# Patient Record
Sex: Male | Born: 1950 | Hispanic: No | Marital: Married | Smoking: Never smoker
Health system: Southern US, Community
[De-identification: ages and names within clinical notes are randomized; demographics above are authoritative.]

## PROBLEM LIST (undated history)

## (undated) DIAGNOSIS — H8110 Benign paroxysmal vertigo, unspecified ear: Secondary | ICD-10-CM

## (undated) DIAGNOSIS — E785 Hyperlipidemia, unspecified: Secondary | ICD-10-CM

## (undated) DIAGNOSIS — M109 Gout, unspecified: Secondary | ICD-10-CM

## (undated) DIAGNOSIS — L989 Disorder of the skin and subcutaneous tissue, unspecified: Secondary | ICD-10-CM

## (undated) DIAGNOSIS — Z8673 Personal history of transient ischemic attack (TIA), and cerebral infarction without residual deficits: Secondary | ICD-10-CM

## (undated) DIAGNOSIS — I639 Cerebral infarction, unspecified: Secondary | ICD-10-CM

## (undated) DIAGNOSIS — M7700 Medial epicondylitis, unspecified elbow: Secondary | ICD-10-CM

## (undated) DIAGNOSIS — R29898 Other symptoms and signs involving the musculoskeletal system: Secondary | ICD-10-CM

## (undated) DIAGNOSIS — Z951 Presence of aortocoronary bypass graft: Secondary | ICD-10-CM

## (undated) DIAGNOSIS — Z87828 Personal history of other (healed) physical injury and trauma: Secondary | ICD-10-CM

## (undated) DIAGNOSIS — N401 Enlarged prostate with lower urinary tract symptoms: Secondary | ICD-10-CM

## (undated) DIAGNOSIS — I10 Essential (primary) hypertension: Secondary | ICD-10-CM

## (undated) DIAGNOSIS — H9190 Unspecified hearing loss, unspecified ear: Secondary | ICD-10-CM

## (undated) DIAGNOSIS — Z973 Presence of spectacles and contact lenses: Secondary | ICD-10-CM

## (undated) DIAGNOSIS — F329 Major depressive disorder, single episode, unspecified: Secondary | ICD-10-CM

## (undated) DIAGNOSIS — I2510 Atherosclerotic heart disease of native coronary artery without angina pectoris: Secondary | ICD-10-CM

## (undated) DIAGNOSIS — I493 Ventricular premature depolarization: Secondary | ICD-10-CM

## (undated) DIAGNOSIS — M25569 Pain in unspecified knee: Secondary | ICD-10-CM

## (undated) DIAGNOSIS — M25512 Pain in left shoulder: Secondary | ICD-10-CM

## (undated) DIAGNOSIS — I6529 Occlusion and stenosis of unspecified carotid artery: Secondary | ICD-10-CM

## (undated) DIAGNOSIS — K219 Gastro-esophageal reflux disease without esophagitis: Secondary | ICD-10-CM

## (undated) DIAGNOSIS — C61 Malignant neoplasm of prostate: Secondary | ICD-10-CM

## (undated) DIAGNOSIS — K439 Ventral hernia without obstruction or gangrene: Secondary | ICD-10-CM

## (undated) DIAGNOSIS — I350 Nonrheumatic aortic (valve) stenosis: Secondary | ICD-10-CM

## (undated) DIAGNOSIS — R21 Rash and other nonspecific skin eruption: Secondary | ICD-10-CM

## (undated) DIAGNOSIS — R739 Hyperglycemia, unspecified: Secondary | ICD-10-CM

## (undated) DIAGNOSIS — R4701 Aphasia: Secondary | ICD-10-CM

## (undated) DIAGNOSIS — E119 Type 2 diabetes mellitus without complications: Secondary | ICD-10-CM

## (undated) DIAGNOSIS — G459 Transient cerebral ischemic attack, unspecified: Secondary | ICD-10-CM

## (undated) DIAGNOSIS — H269 Unspecified cataract: Secondary | ICD-10-CM

## (undated) DIAGNOSIS — F411 Generalized anxiety disorder: Secondary | ICD-10-CM

## (undated) DIAGNOSIS — E876 Hypokalemia: Secondary | ICD-10-CM

## (undated) HISTORY — DX: Gastro-esophageal reflux disease without esophagitis: K21.9

## (undated) HISTORY — PX: PROSTATE BIOPSY: SHX241

## (undated) HISTORY — DX: Nonrheumatic aortic (valve) stenosis: I35.0

## (undated) HISTORY — PX: EYE SURGERY: SHX253

## (undated) HISTORY — DX: Cerebral infarction, unspecified: I63.9

## (undated) HISTORY — DX: Atherosclerotic heart disease of native coronary artery without angina pectoris: I25.10

## (undated) HISTORY — PX: OTHER SURGICAL HISTORY: SHX169

## (undated) HISTORY — PX: CARDIAC CATHETERIZATION: SHX172

## (undated) HISTORY — DX: Essential (primary) hypertension: I10

## (undated) HISTORY — DX: Hyperlipidemia, unspecified: E78.5

## (undated) HISTORY — DX: Personal history of other (healed) physical injury and trauma: Z87.828

## (undated) HISTORY — PX: FRACTURE SURGERY: SHX138

## (undated) HISTORY — DX: Unspecified cataract: H26.9

---
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
## 1973-08-03 HISTORY — PX: APPENDECTOMY: SHX54

## 1998-12-02 HISTORY — PX: CORONARY ARTERY BYPASS GRAFT: SHX141

## 1998-12-30 ENCOUNTER — Encounter: Payer: Self-pay | Admitting: Interventional Cardiology

## 1998-12-30 ENCOUNTER — Inpatient Hospital Stay (HOSPITAL_COMMUNITY): Admission: AD | Admit: 1998-12-30 | Discharge: 1999-01-05 | Payer: Self-pay | Admitting: Interventional Cardiology

## 1998-12-31 ENCOUNTER — Encounter: Payer: Self-pay | Admitting: Interventional Cardiology

## 1999-01-01 ENCOUNTER — Encounter: Payer: Self-pay | Admitting: Interventional Cardiology

## 1999-01-02 ENCOUNTER — Encounter: Payer: Self-pay | Admitting: Cardiothoracic Surgery

## 1999-01-03 ENCOUNTER — Encounter: Payer: Self-pay | Admitting: Cardiothoracic Surgery

## 1999-03-04 ENCOUNTER — Encounter (HOSPITAL_COMMUNITY): Admission: RE | Admit: 1999-03-04 | Discharge: 1999-06-02 | Payer: Self-pay | Admitting: Interventional Cardiology

## 2001-12-22 ENCOUNTER — Ambulatory Visit (HOSPITAL_COMMUNITY): Admission: RE | Admit: 2001-12-22 | Discharge: 2001-12-22 | Payer: Self-pay | Admitting: Interventional Cardiology

## 2005-02-26 ENCOUNTER — Encounter: Payer: Self-pay | Admitting: Family Medicine

## 2006-03-01 ENCOUNTER — Encounter: Payer: Self-pay | Admitting: Family Medicine

## 2006-10-13 ENCOUNTER — Encounter: Payer: Self-pay | Admitting: Family Medicine

## 2007-08-04 LAB — HM COLONOSCOPY

## 2008-01-09 ENCOUNTER — Encounter: Payer: Self-pay | Admitting: Family Medicine

## 2008-07-19 ENCOUNTER — Encounter: Payer: Self-pay | Admitting: Family Medicine

## 2009-07-31 ENCOUNTER — Encounter: Payer: Self-pay | Admitting: Family Medicine

## 2009-11-11 ENCOUNTER — Encounter: Payer: Self-pay | Admitting: Family Medicine

## 2010-04-02 ENCOUNTER — Encounter: Payer: Self-pay | Admitting: Family Medicine

## 2010-07-18 ENCOUNTER — Ambulatory Visit: Payer: Self-pay | Admitting: Family Medicine

## 2010-07-18 DIAGNOSIS — M109 Gout, unspecified: Secondary | ICD-10-CM | POA: Insufficient documentation

## 2010-07-18 DIAGNOSIS — I10 Essential (primary) hypertension: Secondary | ICD-10-CM

## 2010-07-18 DIAGNOSIS — E785 Hyperlipidemia, unspecified: Secondary | ICD-10-CM | POA: Insufficient documentation

## 2010-07-18 HISTORY — DX: Gout, unspecified: M10.9

## 2010-07-18 HISTORY — DX: Essential (primary) hypertension: I10

## 2010-07-18 HISTORY — DX: Hyperlipidemia, unspecified: E78.5

## 2010-07-19 ENCOUNTER — Telehealth: Payer: Self-pay | Admitting: Family Medicine

## 2010-07-19 DIAGNOSIS — I10 Essential (primary) hypertension: Secondary | ICD-10-CM | POA: Insufficient documentation

## 2010-07-19 DIAGNOSIS — N400 Enlarged prostate without lower urinary tract symptoms: Secondary | ICD-10-CM

## 2010-07-19 DIAGNOSIS — K219 Gastro-esophageal reflux disease without esophagitis: Secondary | ICD-10-CM | POA: Insufficient documentation

## 2010-07-19 DIAGNOSIS — I2510 Atherosclerotic heart disease of native coronary artery without angina pectoris: Secondary | ICD-10-CM | POA: Insufficient documentation

## 2010-07-19 HISTORY — DX: Atherosclerotic heart disease of native coronary artery without angina pectoris: I25.10

## 2010-07-19 HISTORY — DX: Gastro-esophageal reflux disease without esophagitis: K21.9

## 2010-07-21 LAB — CONVERTED CEMR LAB
ALT: 21 units/L (ref 0–53)
AST: 19 units/L (ref 0–37)
Albumin: 4.1 g/dL (ref 3.5–5.2)
Alkaline Phosphatase: 83 units/L (ref 39–117)
BUN: 18 mg/dL (ref 6–23)
Bilirubin, Direct: 0.2 mg/dL (ref 0.0–0.3)
CO2: 32 meq/L (ref 19–32)
Calcium: 9.5 mg/dL (ref 8.4–10.5)
Chloride: 103 meq/L (ref 96–112)
Cholesterol: 134 mg/dL (ref 0–200)
Creatinine, Ser: 1.2 mg/dL (ref 0.4–1.5)
GFR calc non Af Amer: 67.26 mL/min (ref >60.00)
Glucose, Bld: 89 mg/dL (ref 70–99)
HDL: 40.1 mg/dL (ref >39.00)
LDL Cholesterol: 69 mg/dL (ref 0–99)
PSA: 2.95 ng/mL (ref 0.10–4.00)
Potassium: 4 meq/L (ref 3.5–5.1)
Sodium: 142 meq/L (ref 135–145)
Total Bilirubin: 1.1 mg/dL (ref 0.3–1.2)
Total CHOL/HDL Ratio: 3
Total Protein: 7.1 g/dL (ref 6.0–8.3)
Triglycerides: 127 mg/dL (ref 0.0–149.0)
Uric Acid, Serum: 5.9 mg/dL (ref 4.0–7.8)
VLDL: 25.4 mg/dL (ref 0.0–40.0)

## 2010-08-06 ENCOUNTER — Encounter: Payer: Self-pay | Admitting: Family Medicine

## 2010-08-06 LAB — CONVERTED CEMR LAB
Cholesterol: 145 mg/dL
Creatinine, Ser: 1.2 mg/dL
Direct LDL: 75 mg/dL
Glucose: 91 mg/dL
HDL: 50 mg/dL
Triglycerides: 140 mg/dL
Uric Acid, Serum: 5.4 mg/dL

## 2010-09-04 NOTE — Progress Notes (Signed)
  Prescriptions: OMEPRAZOLE MAGNESIUM 20.6 (20 BASE) MG CPDR (OMEPRAZOLE MAGNESIUM) take 1 by mouth once daily  #90 x 3   Entered and Authorized by:   Crawford Givens MD   Signed by:   Crawford Givens MD on 07/19/2010   Method used:   Electronically to        [facility] [facility] (retail)       [address]       [place], [place]  [address]       Ph: [phone]       Fax: [phone]   RxID:   [id number] SIMVASTATIN 80 MG TABS (SIMVASTATIN) take 1 by mouth once daily  #90 x 3   Entered and Authorized by:   Crawford Givens MD   Signed by:   Crawford Givens MD on 07/19/2010   Method used:   Electronically to        [facility] (retail)       [address]       [place], [place]  [address]       Ph: [phone]       Fax: [phone]   RxID:   [id number] ALLOPURINOL 300 MG TABS (ALLOPURINOL) take 1 by mouth once daily  #90 x 3   Entered and Authorized by:   Crawford Givens MD   Signed by:   Crawford Givens MD on 07/19/2010   Method used:   Electronically to        [facility] ([facility]ail)       [address]       [place], [place]  [address]       Ph: [phone]       Fax: [phone]   RxID:   [id number] HYDROCHLOROTHIAZIDE 25 MG TABS (HYDROCHLOROTHIAZIDE) take 1 by mouth once daily  #90 x 3   Entered and Authorized by:   Crawford Givens MD   Signed by:   Crawford Givens MD on 07/19/2010   Method used:   Electronically to        [facility] [facility] (retail)       [address]       [place], [place]  [address]       Ph: [phone]       Fax: [phone]   RxID:   [id number]

## 2010-09-04 NOTE — Assessment & Plan Note (Signed)
Summary: TRANSFER FROM [facility]   Vital Signs:  Patient profile:   60 year old male Height:      71 inches Weight:      221 pounds BMI:     30.93 Temp:     98.8 degrees F oral Pulse rate:   68 / minute Pulse rhythm:   regular BP sitting:   120 / 80  (left arm) Cuff size:   large  Vitals Entered By: Mervin Hack CMA Duncan Dull) (July 18, 2010 12:01 PM) CC: new patient to establish care, Preventive Care   History of Present Illness: CPE- See prev.  CAD per Dr. Katrinka Blazing with cards.  No CP  Hypertension:      Using medication without problems or lightheadedness: yes Chest pain with exertion:no Edema:no Short of breath:no Other issues:no  Elevated Cholesterol: Using medications without problems:yes Muscle aches: no Other complaints:no  Gout- no flares.  Doing well on meds.    GERD well controlled and working on diet, eating smaller meals.    Allergies (verified): No Known Drug Allergies  Past History:  Past Medical History: Coronary artery disease- [facility] with [facility] Gout Hyperlipidemia Hypertension GERD Benign prostatic hypertrophy chronic changes to L eye after injury  Past Surgical History: Coronary artery bypass graft 12-26-1998 Appendectomy 1975  Family History: Reviewed history and no changes required. F dead 12-25-08, had prostate cancer in 45s M alive  Social History: Reviewed history and no changes required. Working at [employer], travelling 3-4 nights a week Married 1972-12-25 2 daughters and 2 grandkids exercise- 1x/week  Review of Systems       See HPI.  Otherwise negative.    Physical Exam  General:  GEN: nad, alert and oriented HEENT: mucous membranes moist NECK: supple w/o LA CV: rrr.  no murmur PULM: ctab, no inc wob ABD: soft, +bs EXT: no edema SKIN: no acute rash  midline sternotomy scar noted.  Rectal:  No external abnormalities noted. Normal sphincter tone. No rectal masses or tenderness. Prostate:  Prostate gland firm and  smooth, symmetric enlargement, but no nodularity, tenderness, mass, asymmetry or induration.   Impression & Recommendations:  Problem # 1:  Preventive Health Care (ICD-V70.0) See notes on labs.  d/w patient ZO:XWRU and exercise. Up to date on vaccine and colonscopy.  Problem # 2:  GOUT (ICD-274.9) see notes on labs.   Orders: TLB-Uric Acid, Blood (84550-URIC)  His updated medication list for this problem includes:    Allopurinol 300 Mg Tabs (Allopurinol) .[name]... Take 1 by mouth once daily  Problem # 3:  HYPERLIPIDEMIA (ICD-272.4) see notes on labs.  no chagne in meds.  Orders: TLB-BMP (Basic Metabolic Panel-BMET) (80048-METABOL) TLB-Hepatic/Liver Function Pnl (80076-HEPATIC) TLB-Lipid Panel (80061-LIPID)  His updated medication list for this problem includes:    Simvastatin 80 Mg Tabs (Simvastatin) .[name]... Take 1 by mouth once daily  Problem # 4:  HYPERTENSION, BENIGN ESSENTIAL (ICD-401.1) see notes on labs.  no change in meds.  His updated medication list for this problem includes:    Hydrochlorothiazide 25 Mg Tabs (Hydrochlorothiazide) .[name]... Take 1 by mouth once daily  Problem # 5:  GERD (ICD-530.81) controlled, no change in meds.  His updated medication list for this problem includes:    Omeprazole Magnesium 20.6 (20 Base) Mg Cpdr (Omeprazole magnesium) .[name]... Take 1 by mouth once daily  Complete Medication List: 1)  Aspirin 81 Mg Tabs (Aspirin) .... Take 1 by mouth once daily 2)  Hydrochlorothiazide 25 Mg Tabs (Hydrochlorothiazide) .... Take 1 by mouth once  daily 3)  Simvastatin 80 Mg Tabs (Simvastatin) .... Take 1 by mouth once daily 4)  Allopurinol 300 Mg Tabs (Allopurinol) .... Take 1 by mouth once daily 5)  Omeprazole Magnesium 20.6 (20 Base) Mg Cpdr (Omeprazole magnesium) .... Take 1 by mouth once daily  Other Orders: TLB-PSA (Prostate Specific Antigen) (78295-AOZ)  Colorectal Screening:  Colonoscopy Results:    Date of Exam: 08/04/2007    Results:     Immunization & Chemoprophylaxis:    Tetanus vaccine: Historical  (08/03/2005)    Influenza vaccine: Historical  (05/03/2010)    Pneumovax: Historical  (08/04/2007)  Patient Instructions: 1)  You can get your results through our phone system.  Follow the instructions on the blue card.  I'll send in your meds after your labs are back.  Take care.  Glad to see you.  I would get another physical next fall.  Let me know if you have concerns in the meantime.    Orders Added: 1)  Est. Patient 40-64 years [99396] 2)  Est. Patient Level IV [30865] 3)  TLB-BMP (Basic Metabolic Panel-BMET) [80048-METABOL] 4)  TLB-Hepatic/Liver Function Pnl [80076-HEPATIC] 5)  TLB-Lipid Panel [80061-LIPID] 6)  TLB-Uric Acid, Blood [84550-URIC] 7)  TLB-PSA (Prostate Specific Antigen) [78469-GEX]   Immunization History:  Tetanus/Td Immunization History:    Tetanus/Td:  historical (08/03/2005)  Influenza Immunization History:    Influenza:  historical (05/03/2010)  Pneumovax Immunization History:    Pneumovax:  historical (08/04/2007)   Immunization History:  Tetanus/Td Immunization History:    Tetanus/Td:  Historical (08/03/2005)  Influenza Immunization History:    Influenza:  Historical (05/03/2010)  Pneumovax Immunization History:    Pneumovax:  Historical (08/04/2007)  Current Allergies (reviewed today): No known allergies          Prevention & Chronic Care Immunizations   Influenza vaccine: Historical  (05/03/2010)    Tetanus booster: 08/03/2005: Historical    Pneumococcal vaccine: Historical  (08/04/2007)  Colorectal Screening   Hemoccult: Not documented    Colonoscopy: Not documented  Other Screening   PSA: Not documented   PSA ordered.   Smoking status: Not documented  Lipids   Total Cholesterol: Not documented   LDL: Not documented   LDL Direct: Not documented   HDL: Not documented   Triglycerides: Not documented    SGOT (AST): Not documented   SGPT  (ALT): Not documented   Alkaline phosphatase: Not documented   Total bilirubin: Not documented  Hypertension   Last Blood Pressure: 120 / 80  (07/18/2010)   Serum creatinine: Not documented   Serum potassium Not documented  Self-Management Support :    Hypertension self-management support: Not documented    Lipid self-management support: Not documented     Appended Document: TRANSFER FROM [facility]/CP[facility] Stool heme neg on check in office.

## 2010-09-04 NOTE — Letter (Signed)
Summary: Tyler Guerra at [facility] at [facility]   Imported By: Maryln Gottron 08/12/2010 13:00:50  _____________________________________________________________________  External Attachment:    Type:   Image     Comment:   External Document

## 2010-09-04 NOTE — Letter (Signed)
Summary: Tyler Guerra at [facility] at [facility]   Imported By: Maryln Gottron 08/12/2010 13:04:24  _____________________________________________________________________  External Attachment:    Type:   Image     Comment:   External Document

## 2010-09-04 NOTE — Letter (Signed)
Summary: Tyler Guerra at [facility]   Imported By: Maryln Gottron 08/12/2010 12:56:32  _____________________________________________________________________  External Attachment:    Type:   Image     Comment:   External Document

## 2010-09-04 NOTE — Letter (Signed)
Summary: Tyler Guerra at [facility] at [facility]   Imported By: Maryln Gottron 08/12/2010 12:51:21  _____________________________________________________________________  External Attachment:    Type:   Image     Comment:   External Document

## 2010-09-04 NOTE — Letter (Signed)
Summary: Deboraha Sprang at [facility] at [facility]   Imported By: Maryln Gottron 08/12/2010 12:57:44  _____________________________________________________________________  External Attachment:    Type:   Image     Comment:   External Document

## 2010-12-19 NOTE — Cardiovascular Report (Signed)
[facility]  Patient:    Tyler Guerra, Tyler Guerra Visit Number: [id number] MRN: [id number]          Service Type: CAT Location: [facility] [address] Attending Physician:  Lyn Records. Iii Dictated by:   Darci Needle, M.D. Proc. Date: 12/22/01 Admit Date:  12/22/2001 Discharge Date: 12/22/2001   CC:         Al Decant. Janey Greaser, M.D.   Cardiac Catheterization  INDICATION FOR PROCEDURE:  Recurring episodes of chest discomfort.  The patient is 3 years status post bypass surgery.  A recent Cardiolite study demonstrated inhomogeneous uptake in the inferior wall and anterolateral wall. This study is being done to rule out bypass graft occlusion.  He has a free radial graft to the obtuse marginal, a saphenous vein graft to the diagonal, a saphenous vein graft to the PDA, and LIMA to the LAD.  PROCEDURE PERFORMED: 1. Left heart catheterization. 2. Selective coronary angiography. 3. Left ventriculography. 4. Bypass graft angiography including left internal mammary artery    angiography.  DESCRIPTION OF PROCEDURE:  A #6 French sheath was inserted into the right femoral artery using the modified Seldinger technique.  A #6 French A2 multipurpose catheter was used for hemodynamic recordings, left ventriculography, selective left and right coronary angiography, and saphenous vein graft and free radial graft angiography.  A #6 [place] internal mammary catheter was used for left internal mammary artery angiography.  The patient tolerated the procedure without complications.  Versed, 3 mg, was administered.  No complications occurred.  RESULTS:   I. Hemodynamic data      A. Aortic pressure 117/83.      B. Left ventricular pressure 118/9.   II. Left ventriculography:  The left ventricle cavity size was normal.      Contractility was normal.  EF was greater than 60%.  III. Selective coronary angiography      A. Left main coronary:  The left main was free of any  obstruction.         Minimal irregularities were noted.      B. Left anterior descending coronary:  The LAD contains mid 50-70%         stenoses.  There is competitive flow in the distal third of the         vessel due to competition with the internal mammary artery that also         supplies this vessel.  No high-grade obstruction is noted beyond the         graft insertion site.  The mid disease is moderate to moderately         severe.  The second diagonal branch is totally occluded.  This         vessel is filled by a bypass graft.      C. Circumflex artery:  The circumflex artery is totally occluded         proximally.      D. Right coronary:  The right coronary is documented to be totally         occluded proximally by angiography in 2000.   IV. Bypass graft angiography      A. Saphenous vein bypass graft to the diagonal:  This graft is loaded         with luminal irregularities.  No high-grade obstruction is noted.  Up         to 30% narrowing is noted proximally.  The distal vascular territory  supplied by this graft is very small.  Flow through this graft is         relatively sluggish.  There is competitive flow into the diagonal with         the appearance of some continued antegrade flow via the native         circulation.      B. Free radial graft to the obtuse marginal:  This graft is widely         patent.  No areas of obstruction are noted.  The circumflex vessel         supplied by the graft is widely patent.  There is a proximal 70%         stenosis in the circumflex.  There is reflux of contrast into the         distal circumflex via the retrograde flow from the graft into the         native circumflex.      C. Saphenous vein graft to the right coronary:  This appears to be a         sequential graft to the PD and a smaller left ventricular branch.         This graft is widely patent.  The native distal right coronary and         PDA is severely and diffusely  diseased.      D. Left internal mammary graft to the LAD:  This graft is widely patent.         No obstruction was noted.  CONCLUSIONS: 1. Severe native vessel coronary disease with 60-70% stenosis in the mid    left anterior descending artery, total occlusion of the proximal circumflex    and proximal right coronary. 2. Widely patent saphenous vein, free radial, and left internal mammary    artery grafts. 3. Minimal left ventricular dysfunction. 4. Recurring chest pain, probably musculoskeletal.  PLAN:  Medical therapy including anti-inflammatory therapy.  Dictated by: Darci Needle, M.D. Attending Physician:  Lyn Records. Iii DD:  12/22/01 TD:  12/24/01 Job: [id number]

## 2011-08-07 ENCOUNTER — Other Ambulatory Visit: Payer: Self-pay | Admitting: Family Medicine

## 2011-10-08 ENCOUNTER — Other Ambulatory Visit: Payer: Self-pay | Admitting: Family Medicine

## 2011-10-08 DIAGNOSIS — I10 Essential (primary) hypertension: Secondary | ICD-10-CM

## 2011-10-08 DIAGNOSIS — N400 Enlarged prostate without lower urinary tract symptoms: Secondary | ICD-10-CM

## 2011-10-09 ENCOUNTER — Other Ambulatory Visit (INDEPENDENT_AMBULATORY_CARE_PROVIDER_SITE_OTHER): Payer: BC Managed Care – PPO

## 2011-10-09 DIAGNOSIS — M109 Gout, unspecified: Secondary | ICD-10-CM

## 2011-10-09 DIAGNOSIS — I10 Essential (primary) hypertension: Secondary | ICD-10-CM

## 2011-10-09 DIAGNOSIS — N400 Enlarged prostate without lower urinary tract symptoms: Secondary | ICD-10-CM

## 2011-10-09 LAB — LIPID PANEL
Cholesterol: 142 mg/dL (ref 0–200)
HDL: 42 mg/dL (ref >39.00)
LDL Cholesterol: 86 mg/dL (ref 0–99)
Triglycerides: 71 mg/dL (ref 0.0–149.0)
VLDL: 14.2 mg/dL (ref 0.0–40.0)

## 2011-10-09 LAB — COMPREHENSIVE METABOLIC PANEL
AST: 20 U/L (ref 0–37)
Alkaline Phosphatase: 84 U/L (ref 39–117)
BUN: 23 mg/dL (ref 6–23)
Creatinine, Ser: 1.2 mg/dL (ref 0.4–1.5)
Total Bilirubin: 0.9 mg/dL (ref 0.3–1.2)

## 2011-10-09 LAB — URIC ACID: Uric Acid, Serum: 6.2 mg/dL (ref 4.0–7.8)

## 2011-10-12 ENCOUNTER — Other Ambulatory Visit: Payer: Self-pay

## 2011-10-19 ENCOUNTER — Encounter: Payer: Self-pay | Admitting: Family Medicine

## 2011-10-23 ENCOUNTER — Encounter: Payer: Self-pay | Admitting: Family Medicine

## 2011-10-23 ENCOUNTER — Ambulatory Visit (INDEPENDENT_AMBULATORY_CARE_PROVIDER_SITE_OTHER): Payer: BC Managed Care – PPO | Admitting: Family Medicine

## 2011-10-23 VITALS — BP 110/74 | HR 68 | Temp 98.5°F | Wt 226.8 lb

## 2011-10-23 DIAGNOSIS — I2510 Atherosclerotic heart disease of native coronary artery without angina pectoris: Secondary | ICD-10-CM

## 2011-10-23 DIAGNOSIS — I10 Essential (primary) hypertension: Secondary | ICD-10-CM

## 2011-10-23 DIAGNOSIS — K439 Ventral hernia without obstruction or gangrene: Secondary | ICD-10-CM

## 2011-10-23 DIAGNOSIS — Z0000 Encounter for general adult medical examination without abnormal findings: Secondary | ICD-10-CM

## 2011-10-23 DIAGNOSIS — N400 Enlarged prostate without lower urinary tract symptoms: Secondary | ICD-10-CM

## 2011-10-23 DIAGNOSIS — Z8042 Family history of malignant neoplasm of prostate: Secondary | ICD-10-CM

## 2011-10-23 DIAGNOSIS — E785 Hyperlipidemia, unspecified: Secondary | ICD-10-CM

## 2011-10-23 DIAGNOSIS — M109 Gout, unspecified: Secondary | ICD-10-CM

## 2011-10-23 DIAGNOSIS — E7800 Pure hypercholesterolemia, unspecified: Secondary | ICD-10-CM

## 2011-10-23 MED ORDER — SIMVASTATIN 40 MG PO TABS: 40.0000 mg | ORAL_TABLET | Freq: Every day | ORAL | Status: DC

## 2011-10-23 MED ORDER — HYDROCHLOROTHIAZIDE 25 MG PO TABS: 25.0000 mg | ORAL_TABLET | Freq: Every day | ORAL | Status: DC

## 2011-10-23 MED ORDER — ALLOPURINOL 300 MG PO TABS: 300.0000 mg | ORAL_TABLET | Freq: Every day | ORAL | Status: DC

## 2011-10-23 NOTE — Patient Instructions (Signed)
Check with your insurance to see if they will cover the shingles shot at age 61.  I would get a flu shot each fall.   Come back for fasting labs in 2 months.  We'll contact you with your lab report. Work more exercise into your daily routine.  Glad to see you.  [facility] [place].

## 2011-10-23 NOTE — Progress Notes (Signed)
CPE- See plan.  Routine anticipatory guidance given to patient.  See health maintenance.  Dry eyes, better with lubricant drops.    CAD s/p CABG.  Had seen Dr. Katrinka Blazing with [facility].   No CP, sob, BLE edema.   FH prostate cancer.   BPH.  His stream is slightly weaker but w/o sig recent change.  Nocturia 2-3x/night.   Gout controlled, no recent flares. Doing well with allopurinol.   Elevated Cholesterol: Using medications without problems:yes Muscle aches: no Diet compliance: yes Exercise: limited by work schedule, discussed.   Hypertension:    Using medication without problems or lightheadedness: yes Chest pain with exertion:no Edema:no Short of breath:no  He felt a lump in his abd when straining once.   PMH and SH reviewed  Meds, vitals, and allergies reviewed.   ROS: See HPI.  Otherwise negative.    GEN: nad, alert and oriented HEENT: mucous membranes moist, chronic changes to L eye noted NECK: supple w/o LA CV: rrr. PULM: ctab, no inc wob ABD: soft, +bs, soft midline hernia noted EXT: no edema SKIN: no acute rash Prostate gland firm and smooth,  B enlargement, but no nodularity, tenderness, mass, asymmetry or induration.

## 2011-10-26 DIAGNOSIS — K439 Ventral hernia without obstruction or gangrene: Secondary | ICD-10-CM | POA: Insufficient documentation

## 2011-10-26 DIAGNOSIS — C61 Malignant neoplasm of prostate: Secondary | ICD-10-CM | POA: Insufficient documentation

## 2011-10-26 DIAGNOSIS — Z0000 Encounter for general adult medical examination without abnormal findings: Secondary | ICD-10-CM | POA: Insufficient documentation

## 2011-10-26 HISTORY — DX: Ventral hernia without obstruction or gangrene: K43.9

## 2011-10-26 NOTE — Assessment & Plan Note (Signed)
psa wnl.  

## 2011-10-26 NOTE — Assessment & Plan Note (Signed)
Controlled , no change in meds 

## 2011-10-26 NOTE — Assessment & Plan Note (Addendum)
PSA wnl, sx not bothersome enough to treat.

## 2011-10-26 NOTE — Assessment & Plan Note (Signed)
Per cards, feeling well.  Continue current meds except for statin change. Continue to work on diet and exercise.

## 2011-10-26 NOTE — Assessment & Plan Note (Signed)
Nonbothersome, follow clinically with routine cautions given

## 2011-10-26 NOTE — Assessment & Plan Note (Signed)
Colon per [facility] Stool heme neg today . psa wnl D/wpt about labs, diet and exercise.   Td and flu up to date.   F/u prn.

## 2011-10-26 NOTE — Assessment & Plan Note (Signed)
Dec to 40mg  simva and return for labs.  D/w pt.  We may need to change to lipitor depending on his lipids .

## 2011-12-21 ENCOUNTER — Other Ambulatory Visit: Payer: BC Managed Care – PPO

## 2012-04-28 ENCOUNTER — Encounter: Payer: Self-pay | Admitting: *Deleted

## 2012-04-28 ENCOUNTER — Encounter: Payer: Self-pay | Admitting: Family Medicine

## 2012-04-28 ENCOUNTER — Ambulatory Visit (INDEPENDENT_AMBULATORY_CARE_PROVIDER_SITE_OTHER): Payer: BC Managed Care – PPO | Admitting: Family Medicine

## 2012-04-28 VITALS — BP 116/84 | HR 63 | Temp 98.5°F | Wt 224.8 lb

## 2012-04-28 DIAGNOSIS — E7800 Pure hypercholesterolemia, unspecified: Secondary | ICD-10-CM

## 2012-04-28 DIAGNOSIS — Z23 Encounter for immunization: Secondary | ICD-10-CM

## 2012-04-28 DIAGNOSIS — L0291 Cutaneous abscess, unspecified: Secondary | ICD-10-CM

## 2012-04-28 DIAGNOSIS — L0390 Cellulitis, unspecified: Secondary | ICD-10-CM

## 2012-04-28 LAB — LIPID PANEL
HDL: 39.5 mg/dL (ref >39.00)
LDL Cholesterol: 97 mg/dL (ref 0–99)
Total CHOL/HDL Ratio: 4
VLDL: 19.8 mg/dL (ref 0.0–40.0)

## 2012-04-28 MED ORDER — CEPHALEXIN 500 MG PO CAPS: 500.0000 mg | ORAL_CAPSULE | Freq: Three times a day (TID) | ORAL | Status: DC

## 2012-04-28 NOTE — Patient Instructions (Addendum)
Go to the lab on the way out.  We'll contact you with your lab report. Start the keflex today.  Keep the area covered with neosporin and a bandaid.
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
## 2012-04-29 DIAGNOSIS — L0390 Cellulitis, unspecified: Secondary | ICD-10-CM | POA: Insufficient documentation

## 2012-04-29 NOTE — Assessment & Plan Note (Signed)
No fluctuance but it doesn't appear to be healing quickly and there is a small amount of peripheral erythema.  Would start keflex and use topical neosporin, f/u prn. He agrees.  Routine cautions given. No need to I&D.

## 2012-12-23 ENCOUNTER — Other Ambulatory Visit: Payer: Self-pay

## 2012-12-23 MED ORDER — SIMVASTATIN 40 MG PO TABS: 40.0000 mg | ORAL_TABLET | Freq: Every day | ORAL | Status: DC

## 2012-12-23 NOTE — Telephone Encounter (Signed)
Pt request refill sim[facility]atin to [facility]. Advised pt refill sent.

## 2012-12-29 ENCOUNTER — Telehealth: Payer: Self-pay

## 2012-12-29 NOTE — Telephone Encounter (Signed)
Pt came by office to ck on med refills. Advised pt have not received med refills  Request since simvastatin refilled. Pt will call tomorrow with names of meds need [facility].

## 2013-01-03 ENCOUNTER — Other Ambulatory Visit: Payer: Self-pay

## 2013-01-03 MED ORDER — HYDROCHLOROTHIAZIDE 25 MG PO TABS: 25.0000 mg | ORAL_TABLET | Freq: Every day | ORAL | Status: DC

## 2013-01-03 MED ORDER — ALLOPURINOL 300 MG PO TABS: 300.0000 mg | ORAL_TABLET | Freq: Every day | ORAL | Status: DC

## 2013-01-03 NOTE — Telephone Encounter (Signed)
See 01/03/13 phone note.

## 2013-01-03 NOTE — Telephone Encounter (Signed)
Pt request refill allopurinol and HCTZ to [facility]. Advised refill # 90 and pt will cb for cpx appt.

## 2013-04-10 ENCOUNTER — Other Ambulatory Visit: Payer: Self-pay | Admitting: Family Medicine

## 2013-04-20 ENCOUNTER — Encounter: Payer: Self-pay | Admitting: Family Medicine

## 2013-04-20 ENCOUNTER — Ambulatory Visit (INDEPENDENT_AMBULATORY_CARE_PROVIDER_SITE_OTHER): Payer: BC Managed Care – PPO | Admitting: Family Medicine

## 2013-04-20 VITALS — BP 124/86 | HR 70 | Temp 98.3°F | Wt 227.5 lb

## 2013-04-20 DIAGNOSIS — Z1211 Encounter for screening for malignant neoplasm of colon: Secondary | ICD-10-CM

## 2013-04-20 DIAGNOSIS — Z23 Encounter for immunization: Secondary | ICD-10-CM

## 2013-04-20 DIAGNOSIS — I2510 Atherosclerotic heart disease of native coronary artery without angina pectoris: Secondary | ICD-10-CM

## 2013-04-20 DIAGNOSIS — Z8042 Family history of malignant neoplasm of prostate: Secondary | ICD-10-CM

## 2013-04-20 DIAGNOSIS — Z125 Encounter for screening for malignant neoplasm of prostate: Secondary | ICD-10-CM

## 2013-04-20 DIAGNOSIS — I10 Essential (primary) hypertension: Secondary | ICD-10-CM

## 2013-04-20 DIAGNOSIS — H9190 Unspecified hearing loss, unspecified ear: Secondary | ICD-10-CM

## 2013-04-20 DIAGNOSIS — E785 Hyperlipidemia, unspecified: Secondary | ICD-10-CM

## 2013-04-20 DIAGNOSIS — M109 Gout, unspecified: Secondary | ICD-10-CM

## 2013-04-20 LAB — COMPREHENSIVE METABOLIC PANEL
ALT: 24 U/L (ref 0–53)
AST: 18 U/L (ref 0–37)
Albumin: 4.2 g/dL (ref 3.5–5.2)
Calcium: 9.5 mg/dL (ref 8.4–10.5)
Chloride: 103 mEq/L (ref 96–112)
Creatinine, Ser: 1.1 mg/dL (ref 0.4–1.5)
Potassium: 4 mEq/L (ref 3.5–5.1)
Sodium: 141 mEq/L (ref 135–145)
Total Protein: 7.1 g/dL (ref 6.0–8.3)

## 2013-04-20 LAB — LIPID PANEL
HDL: 47.4 mg/dL (ref >39.00)
Total CHOL/HDL Ratio: 4

## 2013-04-20 MED ORDER — HYDROCHLOROTHIAZIDE 25 MG PO TABS: ORAL_TABLET | ORAL | Status: DC

## 2013-04-20 MED ORDER — SIMVASTATIN 40 MG PO TABS: 40.0000 mg | ORAL_TABLET | Freq: Every day | ORAL | Status: DC

## 2013-04-20 MED ORDER — LANSOPRAZOLE 15 MG PO CPDR: 15.0000 mg | DELAYED_RELEASE_CAPSULE | Freq: Every day | ORAL | Status: DC

## 2013-04-20 MED ORDER — ALLOPURINOL 300 MG PO TABS: 300.0000 mg | ORAL_TABLET | Freq: Every day | ORAL | Status: DC

## 2013-04-20 NOTE — Patient Instructions (Addendum)
Go to the lab on the way out.  We'll contact you with your lab report. Shirlee Limerick will call about your referral.  Take care. Glad to see you.  Call cardiology and see when they want to see you.  Check with your insurance to see if they will cover the shingles shot.

## 2013-04-20 NOTE — Progress Notes (Signed)
Hearing loss noted, chronic issue and he wanted referral to audiology.    Hypertension:    Using medication without problems or lightheadedness: yes Chest pain with exertion:no Edema:no Short of breath:no  Gout.  No recent sx.  Rare mild sx. Due for labs.    D/w patient OZ:HYQMVHQ for colon cancer screening, including IFOB vs. colonoscopy.  Risks and benefits of both were discussed and patient voiced understanding.  Pt elects for: IFOB.    Prostate cancer screening d/w pt.  FH noted, father later in life.  No LUTS.  H/o CAD.  No CP.  He'll call about f/u with cards.    Meds, vitals, and allergies reviewed.   PMH and SH reviewed  ROS: See HPI.  Otherwise negative.    GEN: nad, alert and oriented HEENT: mucous membranes moist NECK: supple w/o LA CV: rrr. PULM: ctab, no inc wob ABD: soft, +bs EXT: no edema SKIN: no acute rash

## 2013-04-21 ENCOUNTER — Other Ambulatory Visit: Payer: Self-pay | Admitting: Family Medicine

## 2013-04-21 ENCOUNTER — Encounter: Payer: Self-pay | Admitting: Family Medicine

## 2013-04-21 DIAGNOSIS — Z1211 Encounter for screening for malignant neoplasm of colon: Secondary | ICD-10-CM | POA: Insufficient documentation

## 2013-04-21 DIAGNOSIS — H9190 Unspecified hearing loss, unspecified ear: Secondary | ICD-10-CM

## 2013-04-21 HISTORY — DX: Unspecified hearing loss, unspecified ear: H91.90

## 2013-04-21 MED ORDER — ZOSTER VACCINE LIVE 19400 UNT/0.65ML ~~LOC~~ SOLR: 0.6500 mL | Freq: Once | SUBCUTANEOUS | Status: DC

## 2013-04-21 NOTE — Assessment & Plan Note (Signed)
See notes on labs. 

## 2013-04-21 NOTE — Assessment & Plan Note (Signed)
Rare sx, continue as is.  See notes on labs .

## 2013-04-21 NOTE — Assessment & Plan Note (Signed)
Sent for IFOB today.

## 2013-04-21 NOTE — Assessment & Plan Note (Signed)
Refer

## 2013-04-21 NOTE — Assessment & Plan Note (Signed)
See notes on labs. He'll call about f/u with cards. No CP.

## 2013-04-21 NOTE — Assessment & Plan Note (Signed)
Controlled.  

## 2013-04-21 NOTE — Assessment & Plan Note (Signed)
Controlled, continue statin.  

## 2013-05-09 ENCOUNTER — Other Ambulatory Visit (INDEPENDENT_AMBULATORY_CARE_PROVIDER_SITE_OTHER): Payer: BC Managed Care – PPO

## 2013-05-09 DIAGNOSIS — Z1211 Encounter for screening for malignant neoplasm of colon: Secondary | ICD-10-CM

## 2013-05-09 LAB — FECAL OCCULT BLOOD, IMMUNOCHEMICAL: Fecal Occult Bld: NEGATIVE

## 2013-05-11 ENCOUNTER — Encounter: Payer: Self-pay | Admitting: *Deleted

## 2013-07-31 ENCOUNTER — Other Ambulatory Visit (INDEPENDENT_AMBULATORY_CARE_PROVIDER_SITE_OTHER): Payer: BC Managed Care – PPO

## 2013-07-31 ENCOUNTER — Other Ambulatory Visit: Payer: Self-pay | Admitting: Family Medicine

## 2013-07-31 DIAGNOSIS — Z125 Encounter for screening for malignant neoplasm of prostate: Secondary | ICD-10-CM

## 2013-07-31 DIAGNOSIS — R972 Elevated prostate specific antigen [PSA]: Secondary | ICD-10-CM

## 2013-09-28 ENCOUNTER — Encounter: Payer: Self-pay | Admitting: Family Medicine

## 2013-10-09 ENCOUNTER — Telehealth: Payer: Self-pay | Admitting: Interventional Cardiology

## 2013-10-09 NOTE — Telephone Encounter (Signed)
New Prob    Pt has some questions regarding a biopsy he recently had. Pt has some concerns relating the biopsy results and last open heart surgery. Please call.

## 2013-10-10 NOTE — Telephone Encounter (Signed)
returned pt call.pt sts that he has recently been dx with prostate cancer.he has been told it is localized,not lifecthreatning, and a #6.pt wants to know due to his cardiac history if it worth having surgery /treatment that would affect his quality of life and wants Dr.Smith recommendations. Pt sts that he has not see Dr.Smith in 1 1/2 to 2 years.adv pt that I would discuss with Dr.Smith who would probably suggest an o/v to discuss and make sure that his cardiac status has not changed. Pt adv that I will talk with Dr.Smith and call him back.pt verbalized understanding.

## 2013-10-10 NOTE — Telephone Encounter (Signed)
returned pt call.pt sts that he will keep his appt with Dr.Smith in May 2015.pt sts that he has not decide on surgery and is not having surgery anytime soon.pt will call the office if he needs further assist.

## 2013-11-12 ENCOUNTER — Encounter: Payer: Self-pay | Admitting: Family Medicine

## 2013-12-04 ENCOUNTER — Ambulatory Visit (INDEPENDENT_AMBULATORY_CARE_PROVIDER_SITE_OTHER): Payer: BC Managed Care – PPO | Admitting: Interventional Cardiology

## 2013-12-04 ENCOUNTER — Encounter: Payer: Self-pay | Admitting: Interventional Cardiology

## 2013-12-04 VITALS — BP 138/90 | HR 74 | Ht 70.0 in | Wt 230.0 lb

## 2013-12-04 DIAGNOSIS — I10 Essential (primary) hypertension: Secondary | ICD-10-CM

## 2013-12-04 DIAGNOSIS — I2510 Atherosclerotic heart disease of native coronary artery without angina pectoris: Secondary | ICD-10-CM

## 2013-12-04 DIAGNOSIS — C61 Malignant neoplasm of prostate: Secondary | ICD-10-CM

## 2013-12-04 DIAGNOSIS — E785 Hyperlipidemia, unspecified: Secondary | ICD-10-CM

## 2013-12-04 NOTE — Progress Notes (Signed)
Patient ID: IRISH PIECH, male   DOB: October 23, 1950, 63 y.o.   MRN: [id number]    [address], [address] [place], [place]  [address] Phone: [phone] Fax:  [phone]  Date:  12/04/2013   ID:  DEVERICK PRUSS, DOB 1951-02-20, MRN [id number]  PCP:  Elsie Stain, MD   ASSESSMENT:  1. newly diagnosed prostate cancer 2. Coronary artery disease with bypass surgery 2000 3. Hypertension with poor systolic control 4. Hyperlipidemia 5. V72.81  PLAN:  1. Exercise treadmill test 2. Depending on blood pressure response to exercise, he may need to have better blood pressure medication control. At any rate he needs to decrease salt in his diet 3. Clinical followup in one year 4. If he passes exercise treadmill test, we'll clear him for any form of aggressive therapy related to his prostate cancer   SUBJECTIVE: Tyler Guerra is a 63 y.o. male who is doing well. Bypass surgery 15 years ago. He has had no cardiovascular complications or events since that time. He did not present with an infarct. He has normal LV function. He denies prolonged palpitations and syncope. No neurological complaints have occurred.   Wt Readings from Last 3 Encounters:  12/04/13 230 lb (104.327 kg)  04/20/13 227 lb 8 oz (103.193 kg)  04/28/12 224 lb 12 oz (101.946 kg)     Past Medical History  Diagnosis Date  . Coronary artery disease     Quandarius Nill with [place] cards  . Hyperlipemia   . Hypertension   . GERD (gastroesophageal reflux disease)   . BPH (benign prostatic hyperplasia)   . H/O eye injury     chronic changes to left eye after injury  . Gout     Current Outpatient Prescriptions  Medication Sig Dispense Refill  . allopurinol (ZYLOPRIM) 300 MG tablet Take 1 tablet (300 mg total) by mouth daily.  90 tablet  3  . aspirin 81 MG tablet Take 81 mg by mouth daily.      . hydrochlorothiazide (HYDRODIURIL) 25 MG tablet TAKE ONE TABLET BY MOUTH ONCE DAILY  90 tablet  3  . lansoprazole (PREVACID) 15 MG  capsule Take 1 capsule (15 mg total) by mouth daily.  90 capsule  3  . simvastatin (ZOCOR) 40 MG tablet Take 1 tablet (40 mg total) by mouth at bedtime.  90 tablet  3   No current facility-administered medications for this visit.    Allergies:   No Known Allergies  Social History:  The patient  reports that he has never smoked. He does not have any smokeless tobacco history on file. He reports that he does not drink alcohol.   ROS:  Please see the history of present illness.   Denies claudication. No orthopnea, PND, transient neurological complaints, or edema.   All other systems reviewed and negative.   OBJECTIVE: VS:  BP 138/90  Pulse 74  Ht 5\' 10"  (1.778 m)  Wt 230 lb (104.327 kg)  BMI 33.00 kg/m2 Well nourished, well developed, in no acute distress, appears than his stated age 48: normal Neck: JVD flat. Carotid bruit absent  Cardiac:  normal S1, S2; RRR; no murmur Lungs:  clear to auscultation bilaterally, no wheezing, rhonchi or rales Abd: soft, nontender, no hepatomegaly Ext: Edema absent. 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

## 2013-12-04 NOTE — Patient Instructions (Signed)
Your physician recommends that you continue on your current medications as directed. Please refer to the Current Medication list given to you today.  Your physician has requested that you have an exercise tolerance test. For further information please visit [email or web address]. Please also follow instruction sheet, as given.   Your physician wants you to follow-up in: 1 year You will receive a reminder letter in the mail two months in advance. If you don't receive a letter, please call our office to schedule the follow-up appointment.  

## 2013-12-05 ENCOUNTER — Encounter: Payer: Self-pay | Admitting: Interventional Cardiology

## 2013-12-27 ENCOUNTER — Telehealth (HOSPITAL_COMMUNITY): Payer: Self-pay

## 2014-01-03 ENCOUNTER — Ambulatory Visit (HOSPITAL_COMMUNITY)
Admission: RE | Admit: 2014-01-03 | Discharge: 2014-01-03 | Disposition: A | Discharge status: Home / Self Care | Payer: BC Managed Care – PPO | Source: Ambulatory Visit | Attending: Cardiovascular Disease | Admitting: Cardiovascular Disease

## 2014-01-03 ENCOUNTER — Other Ambulatory Visit: Payer: Self-pay

## 2014-01-03 DIAGNOSIS — I2510 Atherosclerotic heart disease of native coronary artery without angina pectoris: Secondary | ICD-10-CM

## 2014-01-08 ENCOUNTER — Telehealth: Payer: Self-pay

## 2014-01-08 NOTE — Telephone Encounter (Signed)
Message copied by Lamar Laundry on Mon Jan 08, 2014 10:40 AM ------      Message from: Daneen Schick      Created: Fri Jan 05, 2014 10:37 AM       Stress test is very reassuring with no major abnormality noted. BP got too high probably related to medication being held. He is cleared from CV standpoint for Prostate treatment including surgery is that is best treatment for him. I think his heart will tolerate. ------

## 2014-01-24 ENCOUNTER — Encounter: Payer: Self-pay | Admitting: Interventional Cardiology

## 2014-02-01 NOTE — Telephone Encounter (Signed)
Encounter complete. 

## 2014-02-08 NOTE — Telephone Encounter (Signed)
Encounter complete. 

## 2014-04-30 ENCOUNTER — Other Ambulatory Visit: Payer: Self-pay | Admitting: Family Medicine

## 2014-05-11 ENCOUNTER — Encounter: Payer: Self-pay | Admitting: Family Medicine

## 2014-05-11 ENCOUNTER — Ambulatory Visit (INDEPENDENT_AMBULATORY_CARE_PROVIDER_SITE_OTHER): Payer: BC Managed Care – PPO | Admitting: Family Medicine

## 2014-05-11 VITALS — BP 132/76 | HR 72 | Temp 97.9°F | Ht 69.25 in | Wt 230.5 lb

## 2014-05-11 DIAGNOSIS — I519 Heart disease, unspecified: Secondary | ICD-10-CM

## 2014-05-11 DIAGNOSIS — E785 Hyperlipidemia, unspecified: Secondary | ICD-10-CM

## 2014-05-11 DIAGNOSIS — Z23 Encounter for immunization: Secondary | ICD-10-CM

## 2014-05-11 DIAGNOSIS — Z8639 Personal history of other endocrine, nutritional and metabolic disease: Secondary | ICD-10-CM

## 2014-05-11 DIAGNOSIS — Z8739 Personal history of other diseases of the musculoskeletal system and connective tissue: Secondary | ICD-10-CM

## 2014-05-11 DIAGNOSIS — Z951 Presence of aortocoronary bypass graft: Secondary | ICD-10-CM

## 2014-05-11 DIAGNOSIS — C61 Malignant neoplasm of prostate: Secondary | ICD-10-CM

## 2014-05-11 DIAGNOSIS — I10 Essential (primary) hypertension: Secondary | ICD-10-CM

## 2014-05-11 DIAGNOSIS — Z0000 Encounter for general adult medical examination without abnormal findings: Secondary | ICD-10-CM

## 2014-05-11 DIAGNOSIS — M109 Gout, unspecified: Secondary | ICD-10-CM

## 2014-05-11 DIAGNOSIS — I2510 Atherosclerotic heart disease of native coronary artery without angina pectoris: Secondary | ICD-10-CM

## 2014-05-11 DIAGNOSIS — Z7189 Other specified counseling: Secondary | ICD-10-CM

## 2014-05-11 DIAGNOSIS — Z125 Encounter for screening for malignant neoplasm of prostate: Secondary | ICD-10-CM

## 2014-05-11 LAB — LIPID PANEL
Cholesterol: 185 mg/dL (ref 0–200)
HDL: 43.9 mg/dL (ref >39.00)
LDL Cholesterol: 114 mg/dL — ABNORMAL HIGH (ref 0–99)
NONHDL: 141.1
Total CHOL/HDL Ratio: 4
Triglycerides: 136 mg/dL (ref 0.0–149.0)
VLDL: 27.2 mg/dL (ref 0.0–40.0)

## 2014-05-11 LAB — COMPREHENSIVE METABOLIC PANEL
ALT: 27 U/L (ref 0–53)
AST: 19 U/L (ref 0–37)
Albumin: 3.6 g/dL (ref 3.5–5.2)
Alkaline Phosphatase: 85 U/L (ref 39–117)
BILIRUBIN TOTAL: 0.7 mg/dL (ref 0.2–1.2)
BUN: 25 mg/dL — ABNORMAL HIGH (ref 6–23)
CO2: 28 meq/L (ref 19–32)
Calcium: 9.4 mg/dL (ref 8.4–10.5)
Chloride: 105 mEq/L (ref 96–112)
Creatinine, Ser: 1.1 mg/dL (ref 0.4–1.5)
GFR: 75.15 mL/min (ref >60.00)
Glucose, Bld: 122 mg/dL — ABNORMAL HIGH (ref 70–99)
Potassium: 4 mEq/L (ref 3.5–5.1)
SODIUM: 138 meq/L (ref 135–145)
TOTAL PROTEIN: 7.2 g/dL (ref 6.0–8.3)

## 2014-05-11 LAB — PSA: PSA: 4.17 ng/mL — AB (ref 0.10–4.00)

## 2014-05-11 LAB — URIC ACID: URIC ACID, SERUM: 5.4 mg/dL (ref 4.0–7.8)

## 2014-05-11 MED ORDER — ALLOPURINOL 300 MG PO TABS: ORAL_TABLET | ORAL | Status: DC

## 2014-05-11 MED ORDER — SIMVASTATIN 40 MG PO TABS: 40.0000 mg | ORAL_TABLET | Freq: Every day | ORAL | Status: DC

## 2014-05-11 MED ORDER — ZOSTER VACCINE LIVE 19400 UNT/0.65ML ~~LOC~~ SOLR: 0.6500 mL | Freq: Once | SUBCUTANEOUS | Status: DC

## 2014-05-11 MED ORDER — HYDROCHLOROTHIAZIDE 25 MG PO TABS: ORAL_TABLET | ORAL | Status: DC

## 2014-05-11 NOTE — Progress Notes (Signed)
Pre visit review using our clinic review tool, if applicable. No additional management support is needed unless otherwise documented below in the visit note.  CPE- See plan.  Routine anticipatory guidance given to patient.  See health maintenance. Flu shot today 2015 Tetanus 2007 PNA 2014 Shingles shot d/w pt.  rx printed for patient.  Colonoscopy 2009 per [facility] PSA hx noted.  D/w pt.   Living will d/w pt.  Wife designated if patient were incapacitated.  Diet and exercise.  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  He does well when at home.  Discussed.  He is working on his diet when on the road.    Prostate cancer.  Last saw uro in 11/2013.  I asked him to f/u with uro.  We are rechecking PSA today.    CAD.  Had seen cards prev this year.  H/o CABG years ago.  No CP, SOB, BLE edema. Due for labs.    H/o gout.  No gout flares. Due for labs.  Compliant with meds.    PMH and SH reviewed  Meds, vitals, and allergies reviewed.   ROS: See HPI.  Otherwise negative.    GEN: nad, alert and oriented HEENT: mucous membranes moist NECK: supple w/o LA CV: rrr. PULM: ctab, no inc wob ABD: soft, +bs EXT: no edema SKIN: no acute rash

## 2014-05-11 NOTE — Patient Instructions (Signed)
Go to the lab on the way out.  We'll contact you with your lab report. We'll be in touch.  Take care.  Don't change your meds for now.  Glad to see you.

## 2014-05-13 ENCOUNTER — Other Ambulatory Visit: Payer: Self-pay | Admitting: Family Medicine

## 2014-05-13 DIAGNOSIS — Z951 Presence of aortocoronary bypass graft: Secondary | ICD-10-CM

## 2014-05-13 DIAGNOSIS — E785 Hyperlipidemia, unspecified: Secondary | ICD-10-CM

## 2014-05-13 DIAGNOSIS — Z7189 Other specified counseling: Secondary | ICD-10-CM | POA: Insufficient documentation

## 2014-05-13 HISTORY — DX: Presence of aortocoronary bypass graft: Z95.1

## 2014-05-13 NOTE — Assessment & Plan Note (Signed)
Per uro, see noted on f/u PSA.

## 2014-05-13 NOTE — Assessment & Plan Note (Signed)
No gout flares. Due for labs. Compliant with meds. See notes on labs.

## 2014-05-13 NOTE — Assessment & Plan Note (Signed)
Routine anticipatory guidance given to patient.  See health maintenance. Flu shot today 2015 Tetanus 2007 PNA 2014 Shingles shot d/w pt.  rx printed for patient.  Colonoscopy 2009 per [facility] PSA hx noted.  D/w pt.   Living will d/w pt.  Wife designated if patient were incapacitated.  Diet and exercise.  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  He does well when at home.  Discussed.  He is working on his diet when on the road.

## 2014-05-13 NOTE — Assessment & Plan Note (Signed)
No CP, SOB, BLE edema.  Continue as is with current meds. D/w pt about diet.  See notes on labs.

## 2014-05-14 ENCOUNTER — Telehealth: Payer: Self-pay | Admitting: Family Medicine

## 2014-05-14 NOTE — Telephone Encounter (Signed)
emmi emailed °

## 2014-10-30 ENCOUNTER — Ambulatory Visit: Payer: Self-pay | Admitting: Family Medicine

## 2014-11-01 ENCOUNTER — Ambulatory Visit (INDEPENDENT_AMBULATORY_CARE_PROVIDER_SITE_OTHER): Payer: BLUE CROSS/BLUE SHIELD | Admitting: Family Medicine

## 2014-11-01 ENCOUNTER — Encounter: Payer: Self-pay | Admitting: Family Medicine

## 2014-11-01 VITALS — BP 120/80 | HR 76 | Temp 98.5°F | Wt 225.0 lb

## 2014-11-01 DIAGNOSIS — H8110 Benign paroxysmal vertigo, unspecified ear: Secondary | ICD-10-CM

## 2014-11-01 DIAGNOSIS — E785 Hyperlipidemia, unspecified: Secondary | ICD-10-CM

## 2014-11-01 LAB — LIPID PANEL
CHOL/HDL RATIO: 5
Cholesterol: 200 mg/dL (ref 0–200)
HDL: 43.8 mg/dL (ref >39.00)
LDL Cholesterol: 125 mg/dL — ABNORMAL HIGH (ref 0–99)
NONHDL: 156.2
Triglycerides: 157 mg/dL — ABNORMAL HIGH (ref 0.0–149.0)
VLDL: 31.4 mg/dL (ref 0.0–40.0)

## 2014-11-01 LAB — BASIC METABOLIC PANEL
BUN: 23 mg/dL (ref 6–23)
CALCIUM: 9.9 mg/dL (ref 8.4–10.5)
CO2: 30 meq/L (ref 19–32)
Chloride: 102 mEq/L (ref 96–112)
Creatinine, Ser: 1.14 mg/dL (ref 0.40–1.50)
GFR: 68.99 mL/min (ref >60.00)
GLUCOSE: 116 mg/dL — AB (ref 70–99)
Potassium: 3.8 mEq/L (ref 3.5–5.1)
Sodium: 138 mEq/L (ref 135–145)

## 2014-11-01 MED ORDER — ZOSTER VACCINE LIVE 19400 UNT/0.65ML ~~LOC~~ SOLR: 0.6500 mL | Freq: Once | SUBCUTANEOUS | Status: DC

## 2014-11-01 NOTE — Progress Notes (Signed)
Pre visit review using our clinic review tool, if applicable. No additional management support is needed unless otherwise documented below in the visit note.  He needed another copy of his rx for the shingles shot.  Given to patient.   His weight is down.  Due for f/u lipids and sugar.   Vertigo.  He'll have episodes of "swaying" after standing.  Sometimes his balance won't be as good as when he was younger.  He can occ feel a sensation of room spinning.  Episodic sx.  He has occ popping in the ears.  He doesn't feel like he is going to pass out.  He does have a h/o motion sickness ie on a boat.  Today isn't a bad day for him.  He doesn't recall sx when rolling over in the bed.  He usually has sx with some form of movement.    Meds, vitals, and allergies reviewed.   ROS: See HPI.  Otherwise, noncontributory.  GEN: nad, alert and oriented HEENT: mucous membranes moist NECK: supple w/o LA CV: rrr.  no murmur PULM: ctab, no inc wob ABD: soft, +bs EXT: no edema SKIN: no acute rash CN 2-12 wnl B, S/S/DTR wnl x4 except for L eye changes at baseline.  DHP: weakly positive.

## 2014-11-01 NOTE — Patient Instructions (Signed)
Go to the lab on the way out.  We'll contact you with your lab report. Use the bedside exercises and this should get better.  Likely BPV (benign positional vertigo). You can take meclizine if needed.  It's OTC.

## 2014-11-02 DIAGNOSIS — H8110 Benign paroxysmal vertigo, unspecified ear: Secondary | ICD-10-CM | POA: Insufficient documentation

## 2014-11-02 HISTORY — DX: Benign paroxysmal vertigo, unspecified ear: H81.10

## 2014-11-02 NOTE — Assessment & Plan Note (Signed)
See notes on labs. 

## 2014-11-02 NOTE — Assessment & Plan Note (Addendum)
Likely dx, with separate sensation of popping from likely ETD.  D/w pt about path/phys of both. Can use meclizine prn and bedside exercise for vertigo.  He agrees. Fu prn. >25 minutes spent in face to face time with patient, >50% spent in counselling or coordination of care.

## 2014-11-05 ENCOUNTER — Other Ambulatory Visit: Payer: Self-pay | Admitting: *Deleted

## 2014-11-05 ENCOUNTER — Telehealth: Payer: Self-pay | Admitting: *Deleted

## 2014-11-05 MED ORDER — ATORVASTATIN CALCIUM 40 MG PO TABS: 40.0000 mg | ORAL_TABLET | Freq: Every day | ORAL | Status: DC

## 2014-11-05 NOTE — Telephone Encounter (Signed)
Patient states that he would like the Rx for Atorvastatin sent to [facility] in [facility].  Rx sent electronically.  Patient will schedule CPE with labs prior in the fall.

## 2014-11-09 ENCOUNTER — Other Ambulatory Visit: Payer: BC Managed Care – PPO

## 2014-12-27 ENCOUNTER — Telehealth: Payer: Self-pay | Admitting: *Deleted

## 2014-12-27 MED ORDER — PREDNISONE 20 MG PO TABS: ORAL_TABLET | ORAL | Status: DC

## 2014-12-27 NOTE — Telephone Encounter (Signed)
Prednisone rx sent.  2 tabs a day for 4 days, then 1 tab a day for 4 days.  F/u if not better.  Take with food.  Thanks.

## 2014-12-27 NOTE — Telephone Encounter (Signed)
Patient notified as instructed by telephone and verbalized understanding. 

## 2014-12-27 NOTE — Telephone Encounter (Signed)
Pt left voicemail at Triage. Pt is having a gout flare up and is requesting a Rx sent to pharmacy on file. Pt said about 10 yrs. ago he was prescribed prednisone 10mg  that worked well for him, pt wasn't sure if you would prescribe this, but if not he is requesting you recommend something to help with the ankle swelling he is having, or prescribe him something else. Pt request call back

## 2015-03-21 ENCOUNTER — Telehealth: Payer: Self-pay | Admitting: Family Medicine

## 2015-03-21 DIAGNOSIS — E785 Hyperlipidemia, unspecified: Secondary | ICD-10-CM

## 2015-03-21 DIAGNOSIS — M109 Gout, unspecified: Secondary | ICD-10-CM

## 2015-03-21 DIAGNOSIS — Z125 Encounter for screening for malignant neoplasm of prostate: Secondary | ICD-10-CM

## 2015-03-21 MED ORDER — ALLOPURINOL 300 MG PO TABS: ORAL_TABLET | ORAL | Status: DC

## 2015-03-21 MED ORDER — HYDROCHLOROTHIAZIDE 25 MG PO TABS: ORAL_TABLET | ORAL | Status: DC

## 2015-03-21 NOTE — Telephone Encounter (Signed)
Allopurinol and hctz sent.  psa ordered, due for lipids and uric acid.  All ordered.  Thanks.

## 2015-03-21 NOTE — Telephone Encounter (Signed)
Left voicemail letting pt know Rx sent to pharmacy, and that orders are done for labs, so he just needs to schedule a fasting lab appt

## 2015-03-21 NOTE — Telephone Encounter (Signed)
Pt came in office requesting PSA drawn. Please advise.  Pt also requesting allopurinol and hydrochlorothiazide. Please advise

## 2015-03-22 ENCOUNTER — Other Ambulatory Visit (INDEPENDENT_AMBULATORY_CARE_PROVIDER_SITE_OTHER): Payer: BLUE CROSS/BLUE SHIELD

## 2015-03-22 DIAGNOSIS — Z125 Encounter for screening for malignant neoplasm of prostate: Secondary | ICD-10-CM

## 2015-03-22 DIAGNOSIS — E785 Hyperlipidemia, unspecified: Secondary | ICD-10-CM

## 2015-03-22 DIAGNOSIS — M109 Gout, unspecified: Secondary | ICD-10-CM

## 2015-03-22 LAB — URIC ACID: URIC ACID, SERUM: 5.8 mg/dL (ref 4.0–7.8)

## 2015-03-22 LAB — LIPID PANEL
CHOLESTEROL: 130 mg/dL (ref 0–200)
HDL: 40.1 mg/dL (ref >39.00)
LDL Cholesterol: 71 mg/dL (ref 0–99)
NonHDL: 90.01
TRIGLYCERIDES: 93 mg/dL (ref 0.0–149.0)
Total CHOL/HDL Ratio: 3
VLDL: 18.6 mg/dL (ref 0.0–40.0)

## 2015-03-22 LAB — PSA: PSA: 4.03 ng/mL — ABNORMAL HIGH (ref 0.10–4.00)

## 2015-03-25 ENCOUNTER — Encounter: Payer: Self-pay | Admitting: *Deleted

## 2015-05-31 ENCOUNTER — Ambulatory Visit (INDEPENDENT_AMBULATORY_CARE_PROVIDER_SITE_OTHER): Payer: BLUE CROSS/BLUE SHIELD

## 2015-05-31 DIAGNOSIS — Z23 Encounter for immunization: Secondary | ICD-10-CM | POA: Diagnosis not present

## 2015-08-08 ENCOUNTER — Ambulatory Visit: Payer: BLUE CROSS/BLUE SHIELD | Admitting: Radiation Oncology

## 2015-08-19 ENCOUNTER — Ambulatory Visit
Admission: RE | Admit: 2015-08-19 | Discharge: 2015-08-19 | Disposition: A | Discharge status: Home / Self Care | Payer: BLUE CROSS/BLUE SHIELD | Source: Ambulatory Visit | Attending: Radiation Oncology | Admitting: Radiation Oncology

## 2015-08-19 ENCOUNTER — Encounter: Payer: Self-pay | Admitting: Radiation Oncology

## 2015-08-19 ENCOUNTER — Ambulatory Visit: Payer: BLUE CROSS/BLUE SHIELD | Admitting: Radiation Oncology

## 2015-08-19 VITALS — BP 136/87 | HR 74 | Temp 98.7°F | Resp 16 | Ht 69.0 in | Wt 224.7 lb

## 2015-08-19 DIAGNOSIS — I2510 Atherosclerotic heart disease of native coronary artery without angina pectoris: Secondary | ICD-10-CM | POA: Insufficient documentation

## 2015-08-19 DIAGNOSIS — C61 Malignant neoplasm of prostate: Secondary | ICD-10-CM | POA: Insufficient documentation

## 2015-08-19 DIAGNOSIS — K219 Gastro-esophageal reflux disease without esophagitis: Secondary | ICD-10-CM | POA: Insufficient documentation

## 2015-08-19 DIAGNOSIS — M109 Gout, unspecified: Secondary | ICD-10-CM | POA: Insufficient documentation

## 2015-08-19 DIAGNOSIS — Z8042 Family history of malignant neoplasm of prostate: Secondary | ICD-10-CM | POA: Insufficient documentation

## 2015-08-19 DIAGNOSIS — Z510 Encounter for antineoplastic radiation therapy: Secondary | ICD-10-CM | POA: Insufficient documentation

## 2015-08-19 DIAGNOSIS — Z809 Family history of malignant neoplasm, unspecified: Secondary | ICD-10-CM | POA: Insufficient documentation

## 2015-08-19 DIAGNOSIS — Z8249 Family history of ischemic heart disease and other diseases of the circulatory system: Secondary | ICD-10-CM | POA: Insufficient documentation

## 2015-08-19 DIAGNOSIS — E785 Hyperlipidemia, unspecified: Secondary | ICD-10-CM | POA: Insufficient documentation

## 2015-08-19 DIAGNOSIS — I10 Essential (primary) hypertension: Secondary | ICD-10-CM | POA: Insufficient documentation

## 2015-08-19 HISTORY — DX: Malignant neoplasm of prostate: C61

## 2015-08-19 NOTE — Progress Notes (Signed)
Radiation Oncology         [phone] ________________________________  Initial outpatient Consultation  Name: Tyler Guerra MRN: [id number]  Date: 08/19/2015  DOB: Mar 11, 1951  [id number] Tyler Dunnings, MD  Tonia Ghent, MD   REFERRING PHYSICIAN: Tonia Ghent, MD  DIAGNOSIS: 65 y.o. gentleman with stage T1c adenocarcinoma of the prostate with a Gleason's score of 3+4 and a PSA of 4.03.    ICD-9-CM ICD-10-CM   1. Prostate cancer ([place]) [place]:: [name] is a pleasant 65 y.o. male with stage T1c adenocarcinoma of the prostate. He has been followed since January 2015 after his PSA was 4.7 with a negative DRE. An ultrasound was performed with volume of 44g, and 5/12 biopsies were positive for adenocarcinoma with a Gleason score of 3+3, Stage Ic, low risk. He elected active surveillance but was lost to follow up until December 2016 and had a PSA in the fall of 2016 that was 4. Repeat DRE was negative and his biopsy on 07/26/15 revealed adenocarcinoma in 5/12 site with a Gleason score of 3+4, which increases his risk category to intermediate. He comes today for further discussion of the role of brachytherapy, radiotherapy +/- ADT with Dr. Tammi Klippel, versus radical prostatectomy.   The patient reviewed the biopsy results with his urologist and he has kindly been referred today for discussion of potential radiation treatment options.      PREVIOUS RADIATION THERAPY: No  PAST MEDICAL HISTORY:  has a past medical history of Coronary artery disease; Hyperlipemia; Hypertension; GERD (gastroesophageal reflux disease); BPH (benign prostatic hyperplasia); H/O eye injury; Gout; and Prostate cancer ([place]).    PAST SURGICAL HISTORY: Past Surgical History  Procedure Laterality Date  . Coronary artery bypass graft  2000  . Appendectomy  1975  . Exericse treadmill test  10/13/06    negative for evidence of ischemia by EKG  . Coronary atherosclerosis of native artery   04/06/2010    ETT adequate tolerance and no ischemia  . Prostate biopsy    . Prostate biopsy      FAMILY HISTORY: family history includes Cancer in his father, paternal uncle, and sister; Dementia in his mother; Heart disease in his father; Prostate cancer in his father; Stroke in his father. There is no history of Colon cancer.  SOCIAL HISTORY:  reports that he has never smoked. He has never used smokeless tobacco. He reports that he does not drink alcohol or use illicit drugs.  ALLERGIES: Review of patient's allergies indicates no known allergies.  MEDICATIONS:  Current Outpatient Prescriptions  Medication Sig Dispense Refill  . allopurinol (ZYLOPRIM) 300 MG tablet TAKE ONE TABLET BY MOUTH ONCE DAILY 90 tablet 3  . aspirin 81 MG tablet Take 81 mg by mouth daily.    [name] atorvastatin (LIPITOR) 40 MG tablet Take 1 tablet (40 mg total) by mouth daily. 90 tablet 3  . esomeprazole (NEXIUM) 20 MG capsule Take 20 mg by mouth daily at 12 noon.    . hydrochlorothiazide (HYDRODIURIL) 25 MG tablet TAKE ONE TABLET BY MOUTH ONCE DAILY 90 tablet 3  . zoster vaccine live, PF, (ZOSTAVAX) 91478 UNT/0.65ML injection Inject 19,400 Units into the skin once. (Patient not taking: Reported on 08/19/2015) 1 each 0   No current facility-administered medications for this encounter.    REVIEW OF SYSTEMS:  A 15 point review of systems is documented in the electronic medical record. This was obtained by the nursing staff. However, I reviewed this  with the patient to discuss relevant findings and make appropriate changes.  A comprehensive review of systems was negative except for: low grade fever and dysuria.  The patient completed an IPSS and IIEF questionnaire.  His IPSS score was 17 indicating moderate urinary outflow obstructive symptoms.  He indicated that his erectile function is able to complete sexual activity on most attempts.  Patient reports that he began feeling as if he had a fever last night (08/18/15) after  experiencing chills. Reports that taking 4 Ibuprofen alleviated the feeling of having a fever. Reports mild dysuria, incomplete emptying, and urgency. Reports nocturia x 3. Reports intermittently having to strain to void. Reports that hematuria assoiciated with biopsy has resolved. Patient denies pain, incontinence/leakage, nausea, and vomiting.   PHYSICAL EXAM: This patient is in no acute distress.  He is alert and oriented.   height is 5\' 9"  (1.753 m) and weight is 224 lb 11.2 oz (101.923 kg). His oral temperature is 98.7 F (37.1 C). His blood pressure is 136/87 and his pulse is 74. His respiration is 16 and oxygen saturation is 100%.  He exhibits no respiratory distress or labored breathing.  He appears neurologically intact.  His mood is pleasant.  His affect is appropriate.  Please note the digital rectal exam findings described above.   KPS = 100   100 - Normal; no complaints; no evidence of disease. 90   - Able to carry on normal activity; minor signs or symptoms of disease. 80   - Normal activity with effort; some signs or symptoms of disease. 96   - Cares for self; unable to carry on normal activity or to do active work. 60   - Requires occasional assistance, but is able to care for most of his personal needs. 50   - Requires considerable assistance and frequent medical care. 60   - Disabled; requires special care and assistance. 83   - Severely disabled; hospital admission is indicated although death not imminent. 29   - Very sick; hospital admission necessary; active supportive treatment necessary. 10   - Moribund; fatal processes progressing rapidly. 0     - Dead  Karnofsky DA, Abelmann [place], Craver LS and [facility] [phone]) The use of the nitrogen mustards in the palliative treatment of carcinoma: with particular reference to bronchogenic carcinoma Cancer 1 634-56   LABORATORY DATA:  No results found for: WBC, HGB, HCT, MCV, PLT Lab Results  Component Value Date   NA 138  11/01/2014   K 3.8 11/01/2014   CL 102 11/01/2014   CO2 30 11/01/2014   Lab Results  Component Value Date   ALT 27 05/11/2014   AST 19 05/11/2014   ALKPHOS 85 05/11/2014   BILITOT 0.7 05/11/2014     RADIOGRAPHY: No results found.    IMPRESSION: This patient is a 65 y.o gentleman with stage T1c adenocarcinoma of the prostate with a Gleason's score of 3+4 and a PSA of 4.03.  His T-Stage, Gleason's Score, and PSA put him into the intermediate risk group.  Accordingly he is eligible for a variety of potential treatment options including prostate surgery, external radiation to the prostate, or prostate seed implantation. Due to intermediate risk status, patient is not a good candidate for active surveillance.   PLAN:   Today I reviewed the findings and workup thus far.  We discussed the natural history of prostate cancer.  We reviewed the the implications of T-stage, Gleason's Score, and PSA on decision-making and outcomes in prostate  cancer.  We discussed radiation treatment in the management of prostate cancer with regard to the logistics and delivery of external beam radiation treatment as well as the logistics and delivery of prostate brachytherapy.  We compared and contrasted each of these approaches and also compared these against prostatectomy.  The patient expressed interest in prostate brachytherapy.  I filled out a patient counseling form for him with relevant treatment diagrams and we retained a copy for our records.   I spent 40 minutes face to face with the patient and more than 50% of that time was spent in counseling and/or coordination of care.  The patient would like to proceed with prostate brachytherapy.  I will share my findings with Dr. Damita Guerra and move forward with scheduling the procedure in the near future.     I enjoyed meeting with him today, and will look forward to participating in the care of this very nice gentleman.      ------------------------------------------------  Sheral Apley. Tammi Klippel, M.D.  This document serves as a record of services personally performed by Tyler Pita, MD. It was created on his behalf by Jenell Milliner, a trained medical scribe. The creation of this record is based on the scribe's personal observations and the provider's statements to them. This document has been checked and approved by the attending provider.

## 2015-08-19 NOTE — Progress Notes (Signed)
GU Location of Tumor / Histology: prostatic adenocarcinoma; originally dx 09/26/2013  If Prostate Cancer, Gleason Score is (3 + 4) and PSA is (4.03) on 03/25/15  Tyler Guerra returned 07/02/2015 to Dr. Risa Grill following an 18 month hiatus from [facility]  for evaluation of new onset ED.   Second prostate biopsy revealed:    Past/Anticipated interventions by urology, if any: prostate biopsy 2015, second prostate biopsy 07/26/15, managed infection following biopsy, referral to radiation oncology for consideration of seed implant  Past/Anticipated interventions by medical oncology, if any:no  Weight changes, if any: no  Bowel/Bladder complaints, if any: IPSS 17. Mild dysuria. Reports hematuria associated with biopsy has resolved. Denies incontinence or leakage. Reports incomplete emptying and urgency. Reports nocturia x 3. Reports intermittently he has to strain to void.   Nausea/Vomiting, if any: no  Pain issues, if any:  no  SAFETY ISSUES:  Prior radiation? no  Pacemaker/ICD? no  Possible current pregnancy? no  Is the patient on methotrexate? no  Current Complaints / other details:  65 year old male. Married with two daughter. Received an injection and seven days of oral antibiotics for infection following second biopsy. Strong family hx of ca. Had quadruple bypass at age 21.

## 2015-08-19 NOTE — Progress Notes (Signed)
See progress note under physician encounter. 

## 2015-08-22 ENCOUNTER — Telehealth: Payer: Self-pay | Admitting: *Deleted

## 2015-08-22 NOTE — Telephone Encounter (Signed)
Called patient to inform of pre-seed  Appt. And implant, spoke with patient's wife and she is aware of these procedures.

## 2015-08-30 ENCOUNTER — Other Ambulatory Visit: Payer: Self-pay | Admitting: Urology

## 2015-09-05 ENCOUNTER — Telehealth: Payer: Self-pay | Admitting: *Deleted

## 2015-09-05 NOTE — Telephone Encounter (Signed)
Called patient to remind of appts. For 09-06-15, lvm for a return call

## 2015-09-06 ENCOUNTER — Ambulatory Visit
Admission: RE | Admit: 2015-09-06 | Discharge: 2015-09-06 | Disposition: A | Discharge status: Home / Self Care | Payer: BLUE CROSS/BLUE SHIELD | Source: Ambulatory Visit | Attending: Radiation Oncology | Admitting: Radiation Oncology

## 2015-09-06 ENCOUNTER — Encounter (HOSPITAL_BASED_OUTPATIENT_CLINIC_OR_DEPARTMENT_OTHER)
Admission: RE | Admit: 2015-09-06 | Discharge: 2015-09-06 | Disposition: A | Discharge status: Home / Self Care | Payer: BLUE CROSS/BLUE SHIELD | Source: Ambulatory Visit | Attending: Urology | Admitting: Urology

## 2015-09-06 ENCOUNTER — Ambulatory Visit (HOSPITAL_COMMUNITY)
Admission: RE | Admit: 2015-09-06 | Discharge: 2015-09-06 | Disposition: A | Discharge status: Home / Self Care | Payer: BLUE CROSS/BLUE SHIELD | Source: Ambulatory Visit | Attending: Urology | Admitting: Urology

## 2015-09-06 DIAGNOSIS — C61 Malignant neoplasm of prostate: Secondary | ICD-10-CM

## 2015-09-06 DIAGNOSIS — Z951 Presence of aortocoronary bypass graft: Secondary | ICD-10-CM | POA: Insufficient documentation

## 2015-09-06 DIAGNOSIS — Z01818 Encounter for other preprocedural examination: Secondary | ICD-10-CM | POA: Diagnosis present

## 2015-09-06 DIAGNOSIS — I10 Essential (primary) hypertension: Secondary | ICD-10-CM | POA: Diagnosis not present

## 2015-09-06 NOTE — Progress Notes (Signed)
  Radiation Oncology         (336) [phone] ________________________________  Name: Tyler Guerra MRN: [id number]  Date: 09/06/2015  DOB: [birth year]  SIMULATION AND TREATMENT PLANNING NOTE PUBIC ARCH STUDY  [id number] [name], MD  Rana Snare, MD  DIAGNOSIS: 65 y.o. gentleman with stage T1c adenocarcinoma of the prostate with a Gleason's score of 3+4 and a PSA of 4.03.     ICD-9-CM ICD-10-CM   1. Prostate cancer ([place]) [place]:  The patient presented today for evaluation for possible prostate seed implant. He was brought to the radiation planning suite and placed supine on the CT couch. A 3-dimensional image study set was obtained in upload to the planning computer. There, on each axial slice, I contoured the prostate gland. Then, using three-dimensional radiation planning tools I reconstructed the prostate in view of the structures from the transperineal needle pathway to assess for possible pubic arch interference. In doing so, I did not appreciate any pubic arch interference. Also, the patient's prostate volume was estimated based on the drawn structure. The volume was 35 cc.  Given the pubic arch appearance and prostate volume, patient remains a good candidate to proceed with prostate seed implant. Today, he freely provided informed written consent to proceed.    PLAN: The patient will undergo prostate seed implant on March 17th.   ________________________________  Sheral Apley. Tammi Klippel, M.D.     This document serves as a record of services personally performed by Tyler Pita, MD and Shona Simpson, PA-C. It was created on his behalf by Arlyce Harman, a trained medical scribe. The creation of this record is based on the scribe's personal observations and the provider's statements to them. This document has been checked and approved by the attending provider.

## 2015-10-10 ENCOUNTER — Telehealth: Payer: Self-pay | Admitting: *Deleted

## 2015-10-10 NOTE — Telephone Encounter (Signed)
CALLED PATIENT TO REMIND OF LABS FOR 10-11-15, LVM FOR A RETURN CALL

## 2015-10-11 ENCOUNTER — Encounter: Payer: Self-pay | Admitting: Family Medicine

## 2015-10-11 ENCOUNTER — Ambulatory Visit: Payer: BLUE CROSS/BLUE SHIELD | Admitting: Family Medicine

## 2015-10-11 ENCOUNTER — Encounter (HOSPITAL_BASED_OUTPATIENT_CLINIC_OR_DEPARTMENT_OTHER): Payer: Self-pay | Admitting: *Deleted

## 2015-10-11 ENCOUNTER — Ambulatory Visit (INDEPENDENT_AMBULATORY_CARE_PROVIDER_SITE_OTHER): Payer: BLUE CROSS/BLUE SHIELD | Admitting: Family Medicine

## 2015-10-11 VITALS — BP 124/84 | HR 84 | Temp 98.5°F | Wt 231.0 lb

## 2015-10-11 DIAGNOSIS — Z7982 Long term (current) use of aspirin: Secondary | ICD-10-CM | POA: Diagnosis not present

## 2015-10-11 DIAGNOSIS — G458 Other transient cerebral ischemic attacks and related syndromes: Secondary | ICD-10-CM | POA: Diagnosis not present

## 2015-10-11 DIAGNOSIS — Z8042 Family history of malignant neoplasm of prostate: Secondary | ICD-10-CM | POA: Diagnosis not present

## 2015-10-11 DIAGNOSIS — R21 Rash and other nonspecific skin eruption: Secondary | ICD-10-CM | POA: Diagnosis not present

## 2015-10-11 DIAGNOSIS — H53131 Sudden visual loss, right eye: Secondary | ICD-10-CM

## 2015-10-11 DIAGNOSIS — E7800 Pure hypercholesterolemia, unspecified: Secondary | ICD-10-CM | POA: Diagnosis not present

## 2015-10-11 DIAGNOSIS — C61 Malignant neoplasm of prostate: Secondary | ICD-10-CM | POA: Diagnosis not present

## 2015-10-11 DIAGNOSIS — L989 Disorder of the skin and subcutaneous tissue, unspecified: Secondary | ICD-10-CM

## 2015-10-11 DIAGNOSIS — Z538 Procedure and treatment not carried out for other reasons: Secondary | ICD-10-CM | POA: Diagnosis not present

## 2015-10-11 DIAGNOSIS — I10 Essential (primary) hypertension: Secondary | ICD-10-CM | POA: Diagnosis not present

## 2015-10-11 DIAGNOSIS — M109 Gout, unspecified: Secondary | ICD-10-CM | POA: Diagnosis not present

## 2015-10-11 DIAGNOSIS — N390 Urinary tract infection, site not specified: Secondary | ICD-10-CM | POA: Diagnosis not present

## 2015-10-11 DIAGNOSIS — Z79899 Other long term (current) drug therapy: Secondary | ICD-10-CM | POA: Diagnosis not present

## 2015-10-11 LAB — CBC
HCT: 44.7 % (ref 39.0–52.0)
Hemoglobin: 14.8 g/dL (ref 13.0–17.0)
MCH: 29.8 pg (ref 26.0–34.0)
MCHC: 33.1 g/dL (ref 30.0–36.0)
MCV: 89.9 fL (ref 78.0–100.0)
PLATELETS: 212 10*3/uL (ref 150–400)
RBC: 4.97 MIL/uL (ref 4.22–5.81)
RDW: 13.3 % (ref 11.5–15.5)
WBC: 7.4 10*3/uL (ref 4.0–10.5)

## 2015-10-11 LAB — COMPREHENSIVE METABOLIC PANEL
ALT: 28 U/L (ref 17–63)
ANION GAP: 10 (ref 5–15)
AST: 22 U/L (ref 15–41)
Albumin: 4.4 g/dL (ref 3.5–5.0)
Alkaline Phosphatase: 104 U/L (ref 38–126)
BUN: 20 mg/dL (ref 6–20)
CHLORIDE: 104 mmol/L (ref 101–111)
CO2: 30 mmol/L (ref 22–32)
Calcium: 9.9 mg/dL (ref 8.9–10.3)
Creatinine, Ser: 1.12 mg/dL (ref 0.61–1.24)
GFR calc Af Amer: >60 mL/min (ref >60)
Glucose, Bld: 167 mg/dL — ABNORMAL HIGH (ref 65–99)
POTASSIUM: 4.1 mmol/L (ref 3.5–5.1)
Sodium: 144 mmol/L (ref 135–145)
Total Bilirubin: 1.2 mg/dL (ref 0.3–1.2)
Total Protein: 8 g/dL (ref 6.5–8.1)

## 2015-10-11 LAB — APTT: APTT: 32 s (ref 24–37)

## 2015-10-11 LAB — PROTIME-INR
INR: 1.11 (ref 0.00–1.49)
PROTHROMBIN TIME: 14 s (ref 11.6–15.2)

## 2015-10-11 MED ORDER — TRIAMCINOLONE ACETONIDE 0.1 % EX CREA: 1.0000 "application " | TOPICAL_CREAM | Freq: Every day | CUTANEOUS | Status: DC

## 2015-10-11 MED ORDER — ASPIRIN 81 MG PO TABS: 162.0000 mg | ORAL_TABLET | Freq: Every day | ORAL | Status: DC

## 2015-10-11 NOTE — Patient Instructions (Signed)
Increase aspirin to 2 a day.  Tyler Guerra will call about your referral. Call your eye doctor about the vision field loss in one eye.   If you have another episode, then go to the ER.  Use the cream at night, with glove if not responding.  We can refer you to dermatology if needed.  Take care.  Glad to see you.

## 2015-10-11 NOTE — Progress Notes (Signed)
Pre visit review using our clinic review tool, if applicable. No additional management support is needed unless otherwise documented below in the visit note.  He got the shingles shot done 10/06/15.    He had prostate seeding pending with uro.   R>L hand rash.  Dry skin, palmar side.  Ongoing, wanted eval vs derm referral.   Also with lesion on the L side of the face.  Slightly bigger now.  Present for years.    TIA sx.   He had a vision change 3 weeks ago.  It was 12:31 but he only saw 12:3__, ie he couldn't see the R side of the watch He had a vision cut when he couldn't see a lady's full face- he only saw the left half of her face.  If was brief and self resolved.  He has vision changes in L eye at baseline.  No slurred speech.   No motor changes.   He had a similar event about 1 year ago.  Each episode was about 15 minutes.   Last seen at eye clinic about 3 months ago.   No residual sx but known L eye vision changes at baseline, preceding any of this, from injury in the distant past.   Meds, vitals, and allergies reviewed.   ROS: See HPI.  Otherwise, noncontributory.  GEN: nad, alert and oriented HEENT: mucous membranes moist NECK: supple w/o LA CV: rrr. PULM: ctab, no inc wob ABD: soft, +bs EXT: no edema SKIN: no acute rash but chronic R>L palmar rash noted.  4 mm brown papule noted on the L cheek.  CN 2-12 wnl B, S/S/DTR wnl x4 except for L eye with chronic defect noted.

## 2015-10-14 ENCOUNTER — Encounter (HOSPITAL_BASED_OUTPATIENT_CLINIC_OR_DEPARTMENT_OTHER): Payer: Self-pay | Admitting: *Deleted

## 2015-10-14 ENCOUNTER — Encounter: Payer: Self-pay | Admitting: Family Medicine

## 2015-10-14 DIAGNOSIS — L989 Disorder of the skin and subcutaneous tissue, unspecified: Secondary | ICD-10-CM | POA: Insufficient documentation

## 2015-10-14 DIAGNOSIS — G459 Transient cerebral ischemic attack, unspecified: Secondary | ICD-10-CM | POA: Insufficient documentation

## 2015-10-14 DIAGNOSIS — R21 Rash and other nonspecific skin eruption: Secondary | ICD-10-CM | POA: Insufficient documentation

## 2015-10-14 HISTORY — DX: Rash and other nonspecific skin eruption: R21

## 2015-10-14 HISTORY — DX: Transient cerebral ischemic attack, unspecified: G45.9

## 2015-10-14 HISTORY — DX: Disorder of the skin and subcutaneous tissue, unspecified: L98.9

## 2015-10-14 NOTE — Progress Notes (Signed)
NPO AFTER MN.  ARRIVE AT 0930.  CURRENT EKG, CXR, AND LAB RESULTS IN CHART AND EPIC.  WILL TAKE PRILOSEC AM DOS W/ SIPS OF WATER AND DO FLEET ENEMA.

## 2015-10-14 NOTE — Assessment & Plan Note (Signed)
Use the TAC cream at night, with glove if not responding.  Routine cautions given to patient.  We can refer to dermatology if needed.

## 2015-10-14 NOTE — Assessment & Plan Note (Addendum)
We can refer to dermatology if needed, but this looks benign.  We can follow.  The TIA w/u is likely a more pressing issue, especially since this lesion has been present for years, per patient report.

## 2015-10-14 NOTE — Assessment & Plan Note (Signed)
Presumed.  Increase aspirin to 162mg  a day.  Refer for carotid eval and head CT.  He will call his eye doctor about the vision field loss in one eye.  If another episode, then go to the ER.  Continue other meds in the meantime.   >25 minutes spent in face to face time with patient, >50% spent in counselling or coordination of care

## 2015-10-15 ENCOUNTER — Ambulatory Visit: Admission: RE | Admit: 2015-10-15 | Payer: BLUE CROSS/BLUE SHIELD | Source: Ambulatory Visit

## 2015-10-17 ENCOUNTER — Telehealth: Payer: Self-pay | Admitting: *Deleted

## 2015-10-17 NOTE — Telephone Encounter (Signed)
CALLED PATIENT TO REMIND OF IMPLANT FOR 10-18-15, SPOKE WITH PATIENT'S WIFE [name] AND THEY ARE AWARE OF THIS PROCEDURE.

## 2015-10-18 ENCOUNTER — Encounter (HOSPITAL_BASED_OUTPATIENT_CLINIC_OR_DEPARTMENT_OTHER): Payer: Self-pay | Admitting: Anesthesiology

## 2015-10-18 ENCOUNTER — Ambulatory Visit (HOSPITAL_BASED_OUTPATIENT_CLINIC_OR_DEPARTMENT_OTHER)
Admission: RE | Admit: 2015-10-18 | Discharge: 2015-10-18 | Disposition: A | Discharge status: Home / Self Care | Payer: BLUE CROSS/BLUE SHIELD | Source: Ambulatory Visit | Attending: Urology | Admitting: Urology

## 2015-10-18 ENCOUNTER — Encounter (HOSPITAL_BASED_OUTPATIENT_CLINIC_OR_DEPARTMENT_OTHER): Payer: Self-pay | Admitting: *Deleted

## 2015-10-18 ENCOUNTER — Encounter (HOSPITAL_BASED_OUTPATIENT_CLINIC_OR_DEPARTMENT_OTHER)
Admission: RE | Disposition: A | Discharge status: Home / Self Care | Payer: Self-pay | Source: Ambulatory Visit | Attending: Urology

## 2015-10-18 DIAGNOSIS — Z8042 Family history of malignant neoplasm of prostate: Secondary | ICD-10-CM | POA: Insufficient documentation

## 2015-10-18 DIAGNOSIS — Z538 Procedure and treatment not carried out for other reasons: Secondary | ICD-10-CM | POA: Insufficient documentation

## 2015-10-18 DIAGNOSIS — Z01818 Encounter for other preprocedural examination: Secondary | ICD-10-CM

## 2015-10-18 DIAGNOSIS — M109 Gout, unspecified: Secondary | ICD-10-CM | POA: Insufficient documentation

## 2015-10-18 DIAGNOSIS — N390 Urinary tract infection, site not specified: Secondary | ICD-10-CM | POA: Insufficient documentation

## 2015-10-18 DIAGNOSIS — Z7982 Long term (current) use of aspirin: Secondary | ICD-10-CM | POA: Insufficient documentation

## 2015-10-18 DIAGNOSIS — I10 Essential (primary) hypertension: Secondary | ICD-10-CM | POA: Insufficient documentation

## 2015-10-18 DIAGNOSIS — Z79899 Other long term (current) drug therapy: Secondary | ICD-10-CM | POA: Insufficient documentation

## 2015-10-18 DIAGNOSIS — C61 Malignant neoplasm of prostate: Secondary | ICD-10-CM | POA: Diagnosis not present

## 2015-10-18 DIAGNOSIS — E7800 Pure hypercholesterolemia, unspecified: Secondary | ICD-10-CM | POA: Insufficient documentation

## 2015-10-18 HISTORY — DX: Ventricular premature depolarization: I49.3

## 2015-10-18 HISTORY — DX: Benign prostatic hyperplasia with lower urinary tract symptoms: N40.1

## 2015-10-18 HISTORY — DX: Presence of aortocoronary bypass graft: Z95.1

## 2015-10-18 HISTORY — DX: Presence of spectacles and contact lenses: Z97.3

## 2015-10-18 SURGERY — INSERTION, RADIATION SOURCE, PROSTATE
Anesthesia: Choice

## 2015-10-18 MED ORDER — LIDOCAINE HCL (CARDIAC) 20 MG/ML IV SOLN
INTRAVENOUS | Status: AC
  Filled 2015-10-18: qty 5

## 2015-10-18 MED ORDER — ONDANSETRON HCL 4 MG/2ML IJ SOLN
INTRAMUSCULAR | Status: AC
  Filled 2015-10-18: qty 2

## 2015-10-18 MED ORDER — MIDAZOLAM HCL 2 MG/2ML IJ SOLN
INTRAMUSCULAR | Status: AC
  Filled 2015-10-18: qty 2

## 2015-10-18 MED ORDER — DEXAMETHASONE SODIUM PHOSPHATE 10 MG/ML IJ SOLN
INTRAMUSCULAR | Status: AC
  Filled 2015-10-18: qty 1

## 2015-10-18 MED ORDER — FLEET ENEMA 7-19 GM/118ML RE ENEM
1.0000 | ENEMA | Freq: Once | RECTAL | Status: DC
  Filled 2015-10-18: qty 1

## 2015-10-18 MED ORDER — FENTANYL CITRATE (PF) 100 MCG/2ML IJ SOLN
INTRAMUSCULAR | Status: AC
  Filled 2015-10-18: qty 2

## 2015-10-18 MED ORDER — LACTATED RINGERS IV SOLN
INTRAVENOUS | Status: DC
  Administered 2015-10-18: 10:00:00 via INTRAVENOUS
  Filled 2015-10-18: qty 1000

## 2015-10-18 MED ORDER — KETOROLAC TROMETHAMINE 30 MG/ML IJ SOLN
INTRAMUSCULAR | Status: AC
  Filled 2015-10-18: qty 1

## 2015-10-18 MED ORDER — CIPROFLOXACIN IN D5W 400 MG/200ML IV SOLN
400.0000 mg | INTRAVENOUS | Status: DC
  Filled 2015-10-18: qty 200

## 2015-10-18 MED ORDER — PROPOFOL 10 MG/ML IV BOLUS
INTRAVENOUS | Status: AC
  Filled 2015-10-18: qty 40

## 2015-10-18 MED ORDER — CIPROFLOXACIN IN D5W 400 MG/200ML IV SOLN
INTRAVENOUS | Status: AC
  Filled 2015-10-18: qty 200

## 2015-10-18 SURGICAL SUPPLY — 24 items
BAG URINE DRAINAGE (UROLOGICAL SUPPLIES) IMPLANT
BLADE CLIPPER SURG (BLADE) IMPLANT
CATH FOLEY 2WAY SLVR  5CC 16FR (CATHETERS)
CATH FOLEY 2WAY SLVR 5CC 16FR (CATHETERS) IMPLANT
CATH ROBINSON RED A/P 20FR (CATHETERS) IMPLANT
CLOTH BEACON ORANGE TIMEOUT ST (SAFETY) IMPLANT
COVER BACK TABLE 60X90IN (DRAPES) IMPLANT
COVER MAYO STAND STRL (DRAPES) IMPLANT
DRSG TEGADERM 4X4.75 (GAUZE/BANDAGES/DRESSINGS) IMPLANT
DRSG TEGADERM 8X12 (GAUZE/BANDAGES/DRESSINGS) IMPLANT
GLOVE BIO SURGEON STRL SZ7.5 (GLOVE) IMPLANT
GLOVE ECLIPSE 8.0 STRL XLNG CF (GLOVE) IMPLANT
GOWN STRL REUS W/ TWL XL LVL3 (GOWN DISPOSABLE) IMPLANT
GOWN STRL REUS W/TWL LRG LVL3 (GOWN DISPOSABLE) ×6 IMPLANT
GOWN STRL REUS W/TWL XL LVL3 (GOWN DISPOSABLE)
HOLDER FOLEY CATH W/STRAP (MISCELLANEOUS) IMPLANT
KIT ROOM TURNOVER WOR (KITS) IMPLANT
MANIFOLD NEPTUNE II (INSTRUMENTS) IMPLANT
PACK CYSTO (CUSTOM PROCEDURE TRAY) IMPLANT
SYRINGE 10CC LL (SYRINGE) IMPLANT
TUBE CONNECTING 12'X1/4 (SUCTIONS)
TUBE CONNECTING 12X1/4 (SUCTIONS) IMPLANT
UNDERPAD 30X30 INCONTINENT (UNDERPADS AND DIAPERS) IMPLANT
WATER STERILE IRR 500ML POUR (IV SOLUTION) IMPLANT

## 2015-10-18 NOTE — Progress Notes (Signed)
Patient needs further evaluation before having surgery.. Will be rescheduled at a later date

## 2015-10-18 NOTE — Interval H&P Note (Signed)
History and Physical Interval Note:  10/18/2015 11:01 AM  Tyler Guerra  has presented today for surgery, with the diagnosis of PROSTATE CANCER  The various methods of treatment have been discussed with the patient and family. After consideration of risks, benefits and other options for treatment, the patient has consented to  Procedure(s) with comments: RADIOACTIVE SEED IMPLANT/BRACHYTHERAPY IMPLANT (N/A) -         SEEDS IMPLANTED CYSTOSCOPY FLEXIBLE (N/A) - NO SEEDS FOUND IN BLADDER as a surgical intervention .  The patient's history has been reviewed, patient examined, no change in status, stable for surgery.  I have reviewed the patient's chart and labs.  Questions were answered to the patient's satisfaction.     Blandon Offerdahl S

## 2015-10-18 NOTE — H&P (Signed)
Reason For Visit   Mr Botta presents today for brachii therapy.  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       History of Present Illness   Mr Carnahan originally presented to our office in January 2015 as a consultation from Dr. Elsie Stain for elevated PSA of 4.7. Rectal exam showed no concerning findings.IPSS score was 22-3. The patient did express complaints of mild erectile dysfunction.    Prostate ultrasound did not show any concerning areas. Prostate volume was felt to be 44 g. Unfortunately we did find adenocarcinoma. This was present in 3 out of 6 cores on the right side. All positive cores were for Gleason's 3+3 equal 6 cancer with 10-20% core involvement. On the left side 2 out of 6 cores were positive. This was also for Gleason 3+3 equal 6 cancer with 5-40% core involvement 2 additional cores showed high-grade PIN.    Overall 5 out of 12 cores were positive for cancer all Gleason's 3+3 equal 6. The patient is classified as low risk clinical stage TIc disease. He has had no obvious complications or problems from this biopsy.     He was last seen here in April 2015 for an extended cancer discussion with regard to his at that time recently diagnosed prostate cancer. He was felt to have a low risk clinical stage TIc disease. He did however have at least moderate volume disease and we felt that he would be better off seeking active treatment. He was very undecided at that time he did not want radiation oncology consultation. We wanted him to continue to see [place] in 4 month intervals for ongoing surveillance. He has not been back for 18 months. We did send him a certified letter to indicate our concern. Clinically he has continued to do well. His only new complaint is some mild to moderate erectile dysfunction. He did have a PSA through his primary care provider's office several months ago which fortunately was actually lower at 4.0. He has no  new bony complaints or real changes in voiding patterns.   He subsequently underwent repeat ultrasound and biopsy in December 2016.He continued to have evidence of adenocarcinoma in 5 of the 12 biopsies which is what was noted 18 months ago. At that time there were positive biopsies bilaterally and also involving both the base and apex. There is no reasonable correlation with previous biopsies although not all the cancer is found in the exact same quadrants. What is different this time is that one of his biopsies does show a Gleason 7, a 3+4 cancer. There was 10% involvement. He is now best classified as having intermediate risk although the Gleason 7 component is really quite low in volume. In addition, his PSA has actually fallen, and there certainly has been really no dramatic evidence of pathologic progression over the last 18 months and certainly no evidence of any clinical progression.     2015: AUA score is 12  SHIM  16/25   Past Medical History Problems  1. History of cardiac disorder (Z86.79) 2. History of gout (Z87.39) 3. History of heartburn (Z87.898) 4. History of hypercholesterolemia (Z86.39) 5. History of hypertension (Z86.79)  Surgical History Problems  1. History of Appendectomy 2. History of Heart Surgery  Current Meds 1. Adult Aspirin Low Strength 81 MG CHEW;  Therapy: (Recorded:16Jan2015) to Recorded 2. Allopurinol 300 MG Oral Tablet;  Therapy: (Recorded:16Jan2015) to Recorded 3. Cephalexin 500 MG Oral Capsule; TAKE 1 CAPSULE 4  TIMES DAILY;  Therapy: [id number] to (Evaluate:04Jan2017); Last Rx:28Dec2016 Ordered 4. HydroCHLOROthiazide 25 MG Oral Tablet;  Therapy: (Recorded:16Jan2015) to Recorded 5. Omeprazole 20 MG Oral Capsule Delayed Release;  Therapy: (Recorded:16Jan2015) to Recorded 6. Rapaflo CAPS;  Therapy: (Recorded:30Dec2016) to Recorded 7. Sildenafil Citrate 20 MG Oral Tablet; Take 2-5 tabs prn;  Therapy: [id number] to (Last Rx:29Nov2016) Ordered 8.  Simvastatin 40 MG Oral Tablet;  Therapy: (Recorded:16Jan2015) to Recorded  Allergies Medication  1. No Known Drug Allergies  Family History Problems  1. Family history of malignant neoplasm of breast (Z80.3) : Sister 2. Family history of prostate cancer (Z80.42) : Father  Social History Problems  1. Denied: History of Alcohol use 2. Denied: History of Caffeine use 3. Father deceased   25yrs, old age 70. Married 5. Mother's age   64yrs 6. Never smoker 7. Occupation   Press photographer 8. Two children  Vitals  Blood Pressure: 123 / 82 Temperature: 97.3 F Heart Rate: 80 General: Well-developed well-nourished male in no acute distress Respiratory: Normal effort Cardiac: Regular rate and rhythm Abdomen: Soft nontender without palpable masses  Rectal: Rectal exam demonstrates normal sphincter tone, no tenderness and no masses. Estimated prostate size is 1+. Normal rectal tone, no rectal masses, prostate is smooth, symmetric and non-tender. The prostate has no nodularity and is not tender. The left seminal vesicle is nonpalpable. The right seminal vesicle is nonpalpable. The perineum is normal on inspection.   Extremities: No edema or tenderness Assessment Assessed  1. Bacteriuria with pyuria (N39.0) 2. Prostate cancer (C61) 3. Prostatitis, acute (N41.0)   Discussion/Summary  Patient presents today for outpatient brachii therapy.  Risks and benefits of this procedure discussed with him on several occasions by myself as well as Dr. Ledon Snare.    Signatures Electronically signed by : Rana Snare, M.D.; Aug 06 2015  7:07PM EST

## 2015-10-21 ENCOUNTER — Ambulatory Visit (INDEPENDENT_AMBULATORY_CARE_PROVIDER_SITE_OTHER)
Admission: RE | Admit: 2015-10-21 | Discharge: 2015-10-21 | Disposition: A | Discharge status: Home / Self Care | Payer: BLUE CROSS/BLUE SHIELD | Source: Ambulatory Visit | Attending: Family Medicine | Admitting: Family Medicine

## 2015-10-21 DIAGNOSIS — H53131 Sudden visual loss, right eye: Secondary | ICD-10-CM | POA: Diagnosis not present

## 2015-10-22 ENCOUNTER — Other Ambulatory Visit: Payer: Self-pay | Admitting: Family Medicine

## 2015-10-22 ENCOUNTER — Ambulatory Visit (HOSPITAL_COMMUNITY)
Admission: RE | Admit: 2015-10-22 | Discharge: 2015-10-22 | Disposition: A | Discharge status: Home / Self Care | Payer: BLUE CROSS/BLUE SHIELD | Source: Ambulatory Visit | Attending: Family Medicine | Admitting: Family Medicine

## 2015-10-22 DIAGNOSIS — I6523 Occlusion and stenosis of bilateral carotid arteries: Secondary | ICD-10-CM | POA: Insufficient documentation

## 2015-10-22 DIAGNOSIS — K219 Gastro-esophageal reflux disease without esophagitis: Secondary | ICD-10-CM | POA: Insufficient documentation

## 2015-10-22 DIAGNOSIS — I10 Essential (primary) hypertension: Secondary | ICD-10-CM | POA: Diagnosis not present

## 2015-10-22 DIAGNOSIS — E785 Hyperlipidemia, unspecified: Secondary | ICD-10-CM | POA: Diagnosis not present

## 2015-10-22 DIAGNOSIS — H53131 Sudden visual loss, right eye: Secondary | ICD-10-CM | POA: Diagnosis not present

## 2015-11-08 ENCOUNTER — Ambulatory Visit
Admission: RE | Admit: 2015-11-08 | Discharge: 2015-11-08 | Disposition: A | Discharge status: Home / Self Care | Payer: BLUE CROSS/BLUE SHIELD | Source: Ambulatory Visit | Attending: Radiation Oncology | Admitting: Radiation Oncology

## 2015-11-08 ENCOUNTER — Ambulatory Visit: Payer: Self-pay | Admitting: Radiation Oncology

## 2015-11-08 ENCOUNTER — Ambulatory Visit: Payer: BLUE CROSS/BLUE SHIELD | Admitting: Radiation Oncology

## 2015-11-12 ENCOUNTER — Telehealth: Payer: Self-pay | Admitting: *Deleted

## 2015-11-12 NOTE — Telephone Encounter (Signed)
CALLED PATIENT TO ASK QUESTION, LVM FOR A RETURN CALL 

## 2015-11-18 ENCOUNTER — Telehealth: Payer: Self-pay | Admitting: Family Medicine

## 2015-11-18 NOTE — Telephone Encounter (Signed)
Wife called - pt needs to have results from [place] faxed to cancer center before they can do seed implant in prostate Fax [phone] [facility]  Call pt at [phone] if questions  Thank you

## 2015-11-18 NOTE — Telephone Encounter (Signed)
Please call the cancer center and clarify this.  They should already have all of our info from the EMR.  Thanks.

## 2015-11-18 NOTE — Telephone Encounter (Signed)
What results are they speaking of?

## 2015-11-19 NOTE — Telephone Encounter (Signed)
Wife advised.  Patient is out of town and she will ask him to call for an appt.

## 2015-11-19 NOTE — Telephone Encounter (Signed)
I wasn't thinking they would need this from me.  He needs OV here and I need to know about his f/u from the eye clinic.  That would be the safest option. Thanks.

## 2015-11-19 NOTE — Telephone Encounter (Signed)
They need cardiac clearance.  Fax:  [phone]  ATT:  Enid Derry @ [facility].

## 2015-11-25 ENCOUNTER — Ambulatory Visit (INDEPENDENT_AMBULATORY_CARE_PROVIDER_SITE_OTHER): Payer: BLUE CROSS/BLUE SHIELD | Admitting: Family Medicine

## 2015-11-25 ENCOUNTER — Encounter: Payer: Self-pay | Admitting: Family Medicine

## 2015-11-25 VITALS — BP 120/80 | HR 79 | Temp 98.4°F | Wt 231.5 lb

## 2015-11-25 DIAGNOSIS — Z01818 Encounter for other preprocedural examination: Secondary | ICD-10-CM

## 2015-11-25 DIAGNOSIS — G458 Other transient cerebral ischemic attacks and related syndromes: Secondary | ICD-10-CM | POA: Diagnosis not present

## 2015-11-25 MED ORDER — TRIAMCINOLONE ACETONIDE 0.1 % EX CREA: 1.0000 "application " | TOPICAL_CREAM | Freq: Every day | CUTANEOUS | Status: DC

## 2015-11-25 NOTE — Patient Instructions (Signed)
Take care.  I'll check with urology.  You should be appropriately low risk for the procedure but I want to talk to your other docs about the aspirin.

## 2015-11-25 NOTE — Progress Notes (Signed)
Pre visit review using our clinic review tool, if applicable. No additional management support is needed unless otherwise documented below in the visit note.  Presumed TIA prev.  No other changes in the meantime.  D/w pt in detail.  2 likely events, months apart, without any residual sx.  No other focal neuro changes in the meantime.  He is on appropriate meds with higher dose of ASA now.  He has had cardiac eval in the past, w/o any CP or exertional sx now.  He is back to baseline and tolerating current meds.  He needs f/u EKG since his last EKG predates the last TIA sx earlier this year.   We talked about his risk for procedures in general, with the goal to make his risk as low as possible- we both understand that his risk can never be 0%.  He doesn't have h/o tachy arrhythmias.    Hand sx some better with TAC but he lost a tube.  Needed refill.   PMH and SH reviewed  ROS: See HPI, otherwise noncontributory.  Meds, vitals, and allergies reviewed.   GEN: nad, alert and oriented HEENT: mucous membranes moist, old eye injury noted but no acute changes.  NECK: supple w/o LA CV: rrr. PULM: ctab, no inc wob ABD: soft, +bs EXT: no edema SKIN: no acute rash but hand rash noted.  CN 2-12 wnl B, S/S/DTR wnl x4  EKG reviewed and compared to prev.

## 2015-11-27 ENCOUNTER — Telehealth: Payer: Self-pay | Admitting: Family Medicine

## 2015-11-27 NOTE — Telephone Encounter (Signed)
[facility] called.  She asked for Dr.Duncan to call her back to discuss patient's office visit with Dr.Duncan earlier this week.

## 2015-11-28 ENCOUNTER — Encounter: Payer: Self-pay | Admitting: Family Medicine

## 2015-11-28 NOTE — Assessment & Plan Note (Signed)
With no sx now.  CT head and carotid study okay prev.  No exertional sx and is back to baseline.  D/w pt.  I talked with Dr. Risa Grill by phone.  He should be appropriately low risk for the procedure with the plan to hold the aspirin for 5 days before the seed implant.  I appreciate help of all involved.  >25 minutes spent in face to face time with patient, >50% spent in counselling or coordination of care.

## 2015-11-28 NOTE — Telephone Encounter (Signed)
Tyler Guerra advised and this answers any questions that she needed.  Patient advised.

## 2015-11-28 NOTE — Telephone Encounter (Signed)
Will you please call her back- I talked with Dr. Risa Grill.  Patient should be appropriately low risk for the procedure with the plan to hold the aspirin for 5 days prior.  If she has other questions, please have her call my cell.   Please notify pt re: the above after you notify [place].  Thanks.

## 2015-11-29 ENCOUNTER — Telehealth: Payer: Self-pay | Admitting: Radiation Oncology

## 2015-11-29 ENCOUNTER — Telehealth: Payer: Self-pay | Admitting: *Deleted

## 2015-11-29 NOTE — Telephone Encounter (Signed)
I spoke with Dr. Arnoldo Morale nurse discussing her question about whether Mr. Belarus presented forward with rescheduling seed implant. He felt that the patient did not require any additional workup to move forward. We will go ahead and reschedule his procedure.

## 2015-11-29 NOTE — Telephone Encounter (Signed)
CALLED PATIENT TO ASK QUESTION, LVM FOR A RETURN CALL 

## 2015-12-05 ENCOUNTER — Other Ambulatory Visit: Payer: Self-pay | Admitting: Urology

## 2015-12-06 ENCOUNTER — Telehealth: Payer: Self-pay | Admitting: *Deleted

## 2015-12-06 NOTE — Telephone Encounter (Signed)
CALLED PATIENT TO INFORM OF IMPLANT DATE, LVM FOR A RETURN CALL 

## 2016-01-01 ENCOUNTER — Ambulatory Visit: Admission: RE | Admit: 2016-01-01 | Payer: BLUE CROSS/BLUE SHIELD | Source: Ambulatory Visit

## 2016-01-03 ENCOUNTER — Telehealth: Payer: Self-pay | Admitting: *Deleted

## 2016-01-03 NOTE — Telephone Encounter (Signed)
CALLED PATIENT TO REMIND OF LABS FOR IMPLANT, SPOKE WITH PATIENT AND HE IS AWARE OF THIS. 

## 2016-01-06 DIAGNOSIS — Z8042 Family history of malignant neoplasm of prostate: Secondary | ICD-10-CM | POA: Diagnosis not present

## 2016-01-06 DIAGNOSIS — N410 Acute prostatitis: Secondary | ICD-10-CM | POA: Diagnosis not present

## 2016-01-06 DIAGNOSIS — E7800 Pure hypercholesterolemia, unspecified: Secondary | ICD-10-CM | POA: Diagnosis not present

## 2016-01-06 DIAGNOSIS — M109 Gout, unspecified: Secondary | ICD-10-CM | POA: Diagnosis not present

## 2016-01-06 DIAGNOSIS — E669 Obesity, unspecified: Secondary | ICD-10-CM | POA: Diagnosis not present

## 2016-01-06 DIAGNOSIS — C61 Malignant neoplasm of prostate: Secondary | ICD-10-CM | POA: Diagnosis not present

## 2016-01-06 DIAGNOSIS — Z79899 Other long term (current) drug therapy: Secondary | ICD-10-CM | POA: Diagnosis not present

## 2016-01-06 DIAGNOSIS — I2510 Atherosclerotic heart disease of native coronary artery without angina pectoris: Secondary | ICD-10-CM | POA: Diagnosis not present

## 2016-01-06 DIAGNOSIS — Z6832 Body mass index (BMI) 32.0-32.9, adult: Secondary | ICD-10-CM | POA: Diagnosis not present

## 2016-01-06 DIAGNOSIS — K219 Gastro-esophageal reflux disease without esophagitis: Secondary | ICD-10-CM | POA: Diagnosis not present

## 2016-01-06 DIAGNOSIS — Z7982 Long term (current) use of aspirin: Secondary | ICD-10-CM | POA: Diagnosis not present

## 2016-01-06 DIAGNOSIS — I10 Essential (primary) hypertension: Secondary | ICD-10-CM | POA: Diagnosis not present

## 2016-01-06 DIAGNOSIS — I739 Peripheral vascular disease, unspecified: Secondary | ICD-10-CM | POA: Diagnosis not present

## 2016-01-06 DIAGNOSIS — N390 Urinary tract infection, site not specified: Secondary | ICD-10-CM | POA: Diagnosis not present

## 2016-01-06 LAB — CBC
HEMATOCRIT: 41.3 % (ref 39.0–52.0)
Hemoglobin: 14.4 g/dL (ref 13.0–17.0)
MCH: 29.7 pg (ref 26.0–34.0)
MCHC: 34.9 g/dL (ref 30.0–36.0)
MCV: 85.2 fL (ref 78.0–100.0)
Platelets: 200 10*3/uL (ref 150–400)
RBC: 4.85 MIL/uL (ref 4.22–5.81)
RDW: 13.4 % (ref 11.5–15.5)
WBC: 7.1 10*3/uL (ref 4.0–10.5)

## 2016-01-06 LAB — COMPREHENSIVE METABOLIC PANEL
ALBUMIN: 4.1 g/dL (ref 3.5–5.0)
ALK PHOS: 97 U/L (ref 38–126)
ALT: 21 U/L (ref 17–63)
AST: 21 U/L (ref 15–41)
Anion gap: 8 (ref 5–15)
BILIRUBIN TOTAL: 0.7 mg/dL (ref 0.3–1.2)
BUN: 20 mg/dL (ref 6–20)
CO2: 26 mmol/L (ref 22–32)
Calcium: 9.2 mg/dL (ref 8.9–10.3)
Chloride: 106 mmol/L (ref 101–111)
Creatinine, Ser: 1.15 mg/dL (ref 0.61–1.24)
GFR calc Af Amer: >60 mL/min (ref >60)
GFR calc non Af Amer: >60 mL/min (ref >60)
GLUCOSE: 141 mg/dL — AB (ref 65–99)
POTASSIUM: 3.7 mmol/L (ref 3.5–5.1)
Sodium: 140 mmol/L (ref 135–145)
TOTAL PROTEIN: 7.3 g/dL (ref 6.5–8.1)

## 2016-01-06 LAB — PROTIME-INR
INR: 1.07 (ref 0.00–1.49)
PROTHROMBIN TIME: 13.7 s (ref 11.6–15.2)

## 2016-01-06 LAB — APTT: aPTT: 32 seconds (ref 24–37)

## 2016-01-07 ENCOUNTER — Encounter (HOSPITAL_BASED_OUTPATIENT_CLINIC_OR_DEPARTMENT_OTHER): Payer: Self-pay | Admitting: *Deleted

## 2016-01-07 NOTE — Progress Notes (Signed)
NPO AFTER MN WITH EXCEPTION CLEAR LIQUIDS UNTIL 0700 (NO CREAM [employer] PRODUCTS).  ARRIVE AT 1130.  CURRENT LAB RESULTS , EKG, AND CXR IN CHART AND EPIC.  WILL TAKE PRILOSEC AM DOS W/ SIPS OF WATER AND DO FLEET ENEMA.

## 2016-01-10 ENCOUNTER — Telehealth: Payer: Self-pay | Admitting: *Deleted

## 2016-01-10 NOTE — Telephone Encounter (Signed)
Called patient to remind of procedure for 01-13-16, spoke with patient and he is aware of this procedure

## 2016-01-10 NOTE — H&P (Signed)
Reason For Visit   Tyler Guerra presents today for brachii therapy as a treatment for his low risk adenocarcinoma of the prostate.       History of Present Illness   Tyler Guerra originally presented to our office in January 2015 as a consultation from Dr. Elsie Stain for elevated PSA of 4.7. Rectal exam showed no concerning findings.IPSS score was 22-3. The patient did express complaints of mild erectile dysfunction.    Prostate ultrasound did not show any concerning areas. Prostate volume was felt to be 44 g. Unfortunately we did find adenocarcinoma. This was present in 3 out of 6 cores on the right side. All positive cores were for Gleason's 3+3 equal 6 cancer with 10-20% core involvement. On the left side 2 out of 6 cores were positive. This was also for Gleason 3+3 equal 6 cancer with 5-40% core involvement 2 additional cores showed high-grade PIN.    Overall 5 out of 12 cores were positive for cancer all Gleason's 3+3 equal 6. The patient is classified as low risk clinical stage TIc disease. He has had no obvious complications or problems from this biopsy.     He was last seen here in April 2015 for an extended cancer discussion with regard to his at that time recently diagnosed prostate cancer. He was felt to have a low risk clinical stage TIc disease. He did however have at least moderate volume disease and we felt that he would be better off seeking active treatment. He was very undecided at that time he did not want radiation oncology consultation. We wanted him to continue to see [place] in 4 month intervals for ongoing surveillance. He has not been back for 18 months. We did send him a certified letter to indicate our concern. Clinically he has continued to do well. His only new complaint is some mild to moderate erectile dysfunction. He did have a PSA through his primary care provider's office several months ago which fortunately was actually lower at 4.0. He has no new bony complaints or real  changes in voiding patterns.     2015: AUA score is 12  SHIM  16/25   Past Medical History Problems  1. History of cardiac disorder (Z86.79) 2. History of gout (Z87.39) 3. History of heartburn (Z87.898) 4. History of hypercholesterolemia (Z86.39) 5. History of hypertension (Z86.79)  Surgical History Problems  1. History of Appendectomy 2. History of Heart Surgery  Current Meds 1. Adult Aspirin Low Strength 81 MG CHEW;  Therapy: (Recorded:16Jan2015) to Recorded 2. Allopurinol 300 MG Oral Tablet;  Therapy: (Recorded:16Jan2015) to Recorded 3. Cephalexin 500 MG Oral Capsule; TAKE 1 CAPSULE 4 TIMES DAILY;  Therapy: [id number] to (Evaluate:04Jan2017); Last Rx:28Dec2016 Ordered 4. HydroCHLOROthiazide 25 MG Oral Tablet;  Therapy: (Recorded:16Jan2015) to Recorded 5. Omeprazole 20 MG Oral Capsule Delayed Release;  Therapy: (Recorded:16Jan2015) to Recorded 6. Rapaflo CAPS;  Therapy: (Recorded:30Dec2016) to Recorded 7. Sildenafil Citrate 20 MG Oral Tablet; Take 2-5 tabs prn;  Therapy: [id number] to (Last Rx:29Nov2016) Ordered 8. Simvastatin 40 MG Oral Tablet;  Therapy: (Recorded:16Jan2015) to Recorded  Allergies Medication  1. No Known Drug Allergies  Family History Problems  1. Family history of malignant neoplasm of breast (Z80.3) : Sister 2. Family history of prostate cancer (Z80.42) : Father  Social History Problems  1. Denied: History of Alcohol use 2. Denied: History of Caffeine use 3. Father deceased   105yrs, old age 50. Married 5. Mother's age   62yrs 6. 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 No acute distress.  ENT:. The ears and nose are normal in appearance.  Neck: The appearance of the neck is normal and no neck mass is present.  Pulmonary: No respiratory distress and normal respiratory rhythm and  effort.  Cardiovascular: Heart rate and rhythm are normal . No peripheral edema.  Abdomen: The abdomen is soft and nontender. No masses are palpated. No CVA tenderness. No hernias are palpable. No hepatosplenomegaly noted.  Rectal: Rectal exam demonstrates normal sphincter tone, no tenderness and no masses. The prostate has no nodularity and is not tender. The left seminal vesicle is nonpalpable. The right seminal vesicle is nonpalpable. The perineum is normal on inspection.  Genitourinary: Examination of the penis demonstrates no discharge, no masses, no lesions and a normal meatus. The scrotum is without lesions. The right epididymis is palpably normal and non-tender. The left epididymis is palpably normal and non-tender. The right testis is non-tender and without masses. The left testis is non-tender and without masses.  Lymphatics: The femoral and inguinal nodes are not enlarged or tender.  Skin: Normal skin turgor, no visible rash and no visible skin lesions.  Neuro/Psych:. Mood and affect are appropriate.   Assessment Assessed  1. Bacteriuria with pyuria (N39.0) 2. Prostate cancer (C61) 3. Prostatitis, acute (N41.0)  Plan Prostatitis, acute  1. Follow-up NP/PA Office  Follow-up  Status: Hold For - Appointment,Date of Service   Requested for: 30Dec2016  Discussion/Summary   Here today for brachii therapy for his low risk prostate cancer.  We'll plan on outpatient procedure.  Indwelling Foley catheter overnight.  Routine postoperative treatment plan.

## 2016-01-13 ENCOUNTER — Ambulatory Visit (HOSPITAL_BASED_OUTPATIENT_CLINIC_OR_DEPARTMENT_OTHER)
Admission: RE | Admit: 2016-01-13 | Discharge: 2016-01-13 | Disposition: A | Discharge status: Home / Self Care | Payer: BLUE CROSS/BLUE SHIELD | Source: Ambulatory Visit | Attending: Urology | Admitting: Urology

## 2016-01-13 ENCOUNTER — Encounter (HOSPITAL_BASED_OUTPATIENT_CLINIC_OR_DEPARTMENT_OTHER): Payer: Self-pay | Admitting: Anesthesiology

## 2016-01-13 ENCOUNTER — Encounter (HOSPITAL_BASED_OUTPATIENT_CLINIC_OR_DEPARTMENT_OTHER)
Admission: RE | Disposition: A | Discharge status: Home / Self Care | Payer: Self-pay | Source: Ambulatory Visit | Attending: Urology

## 2016-01-13 ENCOUNTER — Ambulatory Visit (HOSPITAL_COMMUNITY): Payer: BLUE CROSS/BLUE SHIELD

## 2016-01-13 ENCOUNTER — Ambulatory Visit (HOSPITAL_BASED_OUTPATIENT_CLINIC_OR_DEPARTMENT_OTHER): Payer: BLUE CROSS/BLUE SHIELD | Admitting: Anesthesiology

## 2016-01-13 DIAGNOSIS — N410 Acute prostatitis: Secondary | ICD-10-CM | POA: Insufficient documentation

## 2016-01-13 DIAGNOSIS — Z8042 Family history of malignant neoplasm of prostate: Secondary | ICD-10-CM | POA: Insufficient documentation

## 2016-01-13 DIAGNOSIS — Z6832 Body mass index (BMI) 32.0-32.9, adult: Secondary | ICD-10-CM | POA: Insufficient documentation

## 2016-01-13 DIAGNOSIS — I739 Peripheral vascular disease, unspecified: Secondary | ICD-10-CM | POA: Insufficient documentation

## 2016-01-13 DIAGNOSIS — E669 Obesity, unspecified: Secondary | ICD-10-CM | POA: Insufficient documentation

## 2016-01-13 DIAGNOSIS — Z79899 Other long term (current) drug therapy: Secondary | ICD-10-CM | POA: Insufficient documentation

## 2016-01-13 DIAGNOSIS — I2510 Atherosclerotic heart disease of native coronary artery without angina pectoris: Secondary | ICD-10-CM | POA: Insufficient documentation

## 2016-01-13 DIAGNOSIS — M109 Gout, unspecified: Secondary | ICD-10-CM | POA: Insufficient documentation

## 2016-01-13 DIAGNOSIS — C61 Malignant neoplasm of prostate: Secondary | ICD-10-CM | POA: Diagnosis not present

## 2016-01-13 DIAGNOSIS — N390 Urinary tract infection, site not specified: Secondary | ICD-10-CM | POA: Insufficient documentation

## 2016-01-13 DIAGNOSIS — I10 Essential (primary) hypertension: Secondary | ICD-10-CM | POA: Insufficient documentation

## 2016-01-13 DIAGNOSIS — Z7982 Long term (current) use of aspirin: Secondary | ICD-10-CM | POA: Insufficient documentation

## 2016-01-13 DIAGNOSIS — K219 Gastro-esophageal reflux disease without esophagitis: Secondary | ICD-10-CM | POA: Insufficient documentation

## 2016-01-13 DIAGNOSIS — E7800 Pure hypercholesterolemia, unspecified: Secondary | ICD-10-CM | POA: Insufficient documentation

## 2016-01-13 HISTORY — DX: Occlusion and stenosis of unspecified carotid artery: I65.29

## 2016-01-13 HISTORY — DX: Personal history of transient ischemic attack (TIA), and cerebral infarction without residual deficits: Z86.73

## 2016-01-13 HISTORY — PX: RADIOACTIVE SEED IMPLANT: SHX5150

## 2016-01-13 SURGERY — INSERTION, RADIATION SOURCE, PROSTATE
Anesthesia: General | Site: Prostate

## 2016-01-13 MED ORDER — KETOROLAC TROMETHAMINE 30 MG/ML IJ SOLN
INTRAMUSCULAR | Status: DC | PRN
  Administered 2016-01-13: 30 mg via INTRAVENOUS

## 2016-01-13 MED ORDER — BELLADONNA ALKALOIDS-OPIUM 16.2-60 MG RE SUPP
RECTAL | Status: AC
  Filled 2016-01-13: qty 1

## 2016-01-13 MED ORDER — ONDANSETRON HCL 4 MG/2ML IJ SOLN
INTRAMUSCULAR | Status: DC | PRN
  Administered 2016-01-13: 4 mg via INTRAVENOUS

## 2016-01-13 MED ORDER — MIDAZOLAM HCL 2 MG/2ML IJ SOLN
INTRAMUSCULAR | Status: AC
Start: 2016-01-13 — End: 2016-01-13
  Filled 2016-01-13: qty 2

## 2016-01-13 MED ORDER — PROPOFOL 10 MG/ML IV BOLUS
INTRAVENOUS | Status: DC | PRN
  Administered 2016-01-13: 150 mg via INTRAVENOUS
  Administered 2016-01-13: 20 mg via INTRAVENOUS
  Administered 2016-01-13: 30 mg via INTRAVENOUS

## 2016-01-13 MED ORDER — FLEET ENEMA 7-19 GM/118ML RE ENEM
1.0000 | ENEMA | Freq: Once | RECTAL | Status: AC
  Administered 2016-01-13: 1 via RECTAL
  Filled 2016-01-13: qty 1

## 2016-01-13 MED ORDER — ONDANSETRON HCL 4 MG/2ML IJ SOLN
INTRAMUSCULAR | Status: AC
  Filled 2016-01-13: qty 2

## 2016-01-13 MED ORDER — LACTATED RINGERS IV SOLN
INTRAVENOUS | Status: DC
  Administered 2016-01-13 (×3): via INTRAVENOUS
  Filled 2016-01-13: qty 1000

## 2016-01-13 MED ORDER — CIPROFLOXACIN IN D5W 400 MG/200ML IV SOLN
400.0000 mg | INTRAVENOUS | Status: AC
  Administered 2016-01-13: 400 mg via INTRAVENOUS
  Filled 2016-01-13: qty 200

## 2016-01-13 MED ORDER — KETOROLAC TROMETHAMINE 30 MG/ML IJ SOLN
INTRAMUSCULAR | Status: AC
  Filled 2016-01-13: qty 1

## 2016-01-13 MED ORDER — DEXAMETHASONE SODIUM PHOSPHATE 4 MG/ML IJ SOLN
INTRAMUSCULAR | Status: DC | PRN
  Administered 2016-01-13: 10 mg via INTRAVENOUS

## 2016-01-13 MED ORDER — MIDAZOLAM HCL 5 MG/5ML IJ SOLN
INTRAMUSCULAR | Status: DC | PRN
  Administered 2016-01-13 (×2): 1 mg via INTRAVENOUS

## 2016-01-13 MED ORDER — FENTANYL CITRATE (PF) 100 MCG/2ML IJ SOLN
INTRAMUSCULAR | Status: AC
  Filled 2016-01-13: qty 2

## 2016-01-13 MED ORDER — PROMETHAZINE HCL 25 MG/ML IJ SOLN
6.2500 mg | INTRAMUSCULAR | Status: DC | PRN
  Filled 2016-01-13: qty 1

## 2016-01-13 MED ORDER — HYDROCODONE-ACETAMINOPHEN 5-325 MG PO TABS: 1.0000 | ORAL_TABLET | Freq: Four times a day (QID) | ORAL | Status: DC | PRN

## 2016-01-13 MED ORDER — FENTANYL CITRATE (PF) 100 MCG/2ML IJ SOLN
INTRAMUSCULAR | Status: DC | PRN
  Administered 2016-01-13: 50 ug via INTRAVENOUS
  Administered 2016-01-13 (×3): 25 ug via INTRAVENOUS

## 2016-01-13 MED ORDER — PHENYLEPHRINE 40 MCG/ML (10ML) SYRINGE FOR IV PUSH (FOR BLOOD PRESSURE SUPPORT)
PREFILLED_SYRINGE | INTRAVENOUS | Status: AC
  Filled 2016-01-13: qty 10

## 2016-01-13 MED ORDER — LIDOCAINE HCL (CARDIAC) 20 MG/ML IV SOLN
INTRAVENOUS | Status: DC | PRN
  Administered 2016-01-13: 60 mg via INTRAVENOUS
  Administered 2016-01-13: 40 mg via INTRAVENOUS

## 2016-01-13 MED ORDER — PHENYLEPHRINE 40 MCG/ML (10ML) SYRINGE FOR IV PUSH (FOR BLOOD PRESSURE SUPPORT)
PREFILLED_SYRINGE | INTRAVENOUS | Status: DC | PRN
  Administered 2016-01-13 (×3): 80 ug via INTRAVENOUS

## 2016-01-13 MED ORDER — PROPOFOL 500 MG/50ML IV EMUL
INTRAVENOUS | Status: AC
  Filled 2016-01-13: qty 50

## 2016-01-13 MED ORDER — FENTANYL CITRATE (PF) 100 MCG/2ML IJ SOLN
25.0000 ug | INTRAMUSCULAR | Status: DC | PRN
  Filled 2016-01-13: qty 1

## 2016-01-13 MED ORDER — STERILE WATER FOR IRRIGATION IR SOLN
Status: DC | PRN
  Administered 2016-01-13: 3 mL

## 2016-01-13 MED ORDER — DIATRIZOATE MEGLUMINE 30 % UR SOLN
URETHRAL | Status: DC | PRN
  Administered 2016-01-13: 7 mL via URETHRAL

## 2016-01-13 MED ORDER — DEXAMETHASONE SODIUM PHOSPHATE 10 MG/ML IJ SOLN
INTRAMUSCULAR | Status: AC
  Filled 2016-01-13: qty 1

## 2016-01-13 MED ORDER — LIDOCAINE HCL (CARDIAC) 20 MG/ML IV SOLN
INTRAVENOUS | Status: AC
  Filled 2016-01-13: qty 5

## 2016-01-13 MED ORDER — CIPROFLOXACIN HCL 500 MG PO TABS
500.0000 mg | ORAL_TABLET | Freq: Two times a day (BID) | ORAL | Status: DC
Start: 2016-01-13 — End: 2016-02-13

## 2016-01-13 MED ORDER — CIPROFLOXACIN IN D5W 400 MG/200ML IV SOLN
INTRAVENOUS | Status: AC
  Filled 2016-01-13: qty 200

## 2016-01-13 SURGICAL SUPPLY — 28 items
BAG URINE DRAINAGE (UROLOGICAL SUPPLIES) ×3 IMPLANT
BLADE CLIPPER SURG (BLADE) ×3 IMPLANT
CATH FOLEY 2WAY SLVR  5CC 16FR (CATHETERS) ×4
CATH FOLEY 2WAY SLVR 5CC 16FR (CATHETERS) ×2 IMPLANT
CATH ROBINSON RED A/P 20FR (CATHETERS) ×3 IMPLANT
CLOTH BEACON ORANGE TIMEOUT ST (SAFETY) ×3 IMPLANT
COVER BACK TABLE 60X90IN (DRAPES) ×3 IMPLANT
COVER MAYO STAND STRL (DRAPES) ×3 IMPLANT
DRSG TEGADERM 4X4.75 (GAUZE/BANDAGES/DRESSINGS) ×6 IMPLANT
DRSG TEGADERM 8X12 (GAUZE/BANDAGES/DRESSINGS) ×3 IMPLANT
GLOVE BIO SURGEON STRL SZ 6.5 (GLOVE) ×2 IMPLANT
GLOVE BIO SURGEON STRL SZ7.5 (GLOVE) ×6 IMPLANT
GLOVE BIO SURGEONS STRL SZ 6.5 (GLOVE) ×1
GLOVE ECLIPSE 8.0 STRL XLNG CF (GLOVE) ×9 IMPLANT
GLOVE INDICATOR 6.5 STRL GRN (GLOVE) ×6 IMPLANT
GOWN STRL REUS W/ TWL XL LVL3 (GOWN DISPOSABLE) ×1 IMPLANT
GOWN STRL REUS W/TWL XL LVL3 (GOWN DISPOSABLE) ×2
HOLDER FOLEY CATH W/STRAP (MISCELLANEOUS) ×3 IMPLANT
KIT ROOM TURNOVER WOR (KITS) ×3 IMPLANT
MANIFOLD NEPTUNE II (INSTRUMENTS) IMPLANT
PACK CYSTO (CUSTOM PROCEDURE TRAY) ×3 IMPLANT
SPONGE GAUZE 4X4 12PLY STER LF (GAUZE/BANDAGES/DRESSINGS) ×3 IMPLANT
SYRINGE 10CC LL (SYRINGE) ×3 IMPLANT
TUBE CONNECTING 12'X1/4 (SUCTIONS)
TUBE CONNECTING 12X1/4 (SUCTIONS) IMPLANT
UNDERPAD 30X30 INCONTINENT (UNDERPADS AND DIAPERS) ×6 IMPLANT
WATER STERILE IRR 500ML POUR (IV SOLUTION) ×3 IMPLANT
seed ×240 IMPLANT

## 2016-01-13 NOTE — Discharge Instructions (Addendum)
DISCHARGE INSTRUCTIONS FOR PROSTATE SEED IMPLANTATION  Removal of catheter Remove the foley catheter after 24 hours ( day after the procedure).can be done easily by cutting the side port of the catheter, whichallow the balloon to deflate.  You will see 1-2 teaspoons of clear water as the balloon deflates and then the catheter can be slid out without difficulty.        Cut here  Antibiotics You may be given a prescription for an antibiotic to take when you arrive home. If so, be sure to take every tablet in the bottle, even if you are feeling better before the prescription is finished. If you begin itching, notice a rash or start to swell on your trunk, arms, legs and/or throat, immediately stop taking the antibiotic and call your Urologist. Diet Resume your usual diet when you return home. To keep your bowels moving easily and softly, drink prune, apple and cranberry juice at room temperature. You may also take a stool softener, such as Colace, which is available without prescription at local pharmacies. Daily activities ? No driving or heavy lifting for at least two days after the implant. ? No bike riding, horseback riding or riding lawn mowers for the first month after the implant. ? Any strenuous physical activity should be approved by your doctor before you resume it. Sexual relations You may resume sexual relations two weeks after the procedure. A condom should be used for the first two weeks. Your semen may be dark brown or black; this is normal and is related bleeding that may have occurred during the implant. Postoperative swelling Expect swelling and bruising of the scrotum and perineum (the area between the scrotum and anus). Both the swelling and the bruising should resolve in l or 2 weeks. Ice packs and over- the-counter medications such as Tylenol, Advil or Aleve may lessen your discomfort. Postoperative urination Most men experience burning on urination and/or urinary frequency.  If this becomes bothersome, contact your Urologist.  Medication can be prescribed to relieve these problems.  It is normal to have some blood in your urine for a few days after the implant. Special instructions related to the seeds It is unlikely that you will pass an Iodine-125 seed in your urine. The seeds are silver in color and are about as large as a grain of rice. If you pass a seed, do not handle it with your fingers. Use a spoon to place it in an envelope or jar in place this in base occluded area such as the garage or basement for return to the radiation clinic at your convenience.  Contact your doctor for ? Temperature greater than 101 F ? Increasing pain ? Inability to urinate Follow-up  You should have follow up with your urologist and radiation oncologist about 3 weeks after the procedure. General information regarding Iodine seeds ? Iodine-125 is a low energy radioactive material. It is not deeply penetrating and loses energy at short distances. Your prostate will absorb the radiation. Objects that are touched or used by the patient do not become radioactive. ? Body wastes (urine and stool) or body fluids (saliva, tears, semen or blood) are not radioactive. ? The Nuclear Regulatory Commission [facility]) has determined that no radiation precautions are needed for patients undergoing Iodine-125 seed implantation. The [facility] states that such patients do not present a risk to the people around them, including young children and pregnant women. However, in keeping with the general principle that radiation exposure should be kept as low reasonably  possible, we suggest the following: ? Children and pets should not sit on the patient's lap for the first two (2) weeks after the implant. ? Pregnant (or possibly pregnant) women should avoid prolonged, close contact with the patient for the first two (2) weeks after the implant. ? A distance of three (3) feet is acceptable. ? At a distance of three (3)  feet, there is no limit to the length of time anyone can be with the patient. ? Restart aspirin in 3 days   Post Anesthesia Home Care Instructions  Activity: Get plenty of rest for the remainder of the day. A responsible adult should stay with you for 24 hours following the procedure.  For the next 24 hours, DO NOT: -Drive a car -Paediatric nurse -Drink alcoholic beverages -Take any medication unless instructed by your physician -Make any legal decisions or sign important papers.  Meals: Start with liquid foods such as gelatin or soup. Progress to regular foods as tolerated. Avoid greasy, spicy, heavy foods. If nausea and/or vomiting occur, drink only clear liquids until the nausea and/or vomiting subsides. Call your physician if vomiting continues.  Special Instructions/Symptoms: Your throat may feel dry or sore from the anesthesia or the breathing tube placed in your throat during surgery. If this causes discomfort, gargle with warm salt water. The discomfort should disappear within 24 hours.  If you had a scopolamine patch placed behind your ear for the management of post- operative nausea and/or vomiting:  1. The medication in the patch is effective for 72 hours, after which it should be removed.  Wrap patch in a tissue and discard in the trash. Wash hands thoroughly with soap and water. 2. You may remove the patch earlier than 72 hours if you experience unpleasant side effects which may include dry mouth, dizziness or visual disturbances. 3. Avoid touching the patch. Wash your hands with soap and water after contact with the patch.

## 2016-01-13 NOTE — Transfer of Care (Signed)
  Last Vitals:  Filed Vitals:   01/13/16 1236  BP: 141/84  Pulse: 63  Temp: 37 C  Resp: 18    Last Pain: There were no vitals filed for this visit.    Patients Stated Pain Goal: 6 (01/13/16 1213)  Immediate Anesthesia Transfer of Care Note  Patient: Tyler Guerra  Procedure(s) Performed: Procedure(s) (LRB): RADIOACTIVE SEED IMPLANT/BRACHYTHERAPY IMPLANT (N/A)  Patient Location: PACU  Anesthesia Type: General  Level of Consciousness: awake, alert  and oriented  Airway & Oxygen Therapy: Patient Spontanous Breathing and Patient connected to nasal cannula oxygen  Post-op Assessment: Report given to PACU RN and Post -op Vital signs reviewed and stable  Post vital signs: Reviewed and stable  Complications: No apparent anesthesia complications

## 2016-01-13 NOTE — Anesthesia Postprocedure Evaluation (Signed)
Anesthesia Post Note  Patient: Tyler Guerra  Procedure(s) Performed: Procedure(s) (LRB): RADIOACTIVE SEED IMPLANT/BRACHYTHERAPY IMPLANT (N/A)  Patient location during evaluation: PACU Anesthesia Type: General Level of consciousness: awake and alert Pain management: pain level controlled Vital Signs Assessment: post-procedure vital signs reviewed and stable Respiratory status: spontaneous breathing, nonlabored ventilation, respiratory function stable and patient connected to nasal cannula oxygen Cardiovascular status: blood pressure returned to baseline and stable Postop Assessment: no signs of nausea or vomiting Anesthetic complications: no    Last Vitals:  Filed Vitals:   01/13/16 1600 01/13/16 1615  BP: 139/81 146/78  Pulse: 58 64  Temp:    Resp: 9 24    Last Pain: There were no vitals filed for this visit.               Glori Machnik J

## 2016-01-13 NOTE — Progress Notes (Signed)
  Radiation Oncology         [phone] ________________________________  Name: Tyler Guerra MRN: [id number]  Date: 01/13/2016  DOB: [birth year]       Prostate Seed Implant  [id number] [name], MD  No ref. provider found  DIAGNOSIS: 64 y.o. gentleman with stage T1c adenocarcinoma of the prostate with a Gleason's score of 3+4 and a PSA of 4.03  - C61  PROCEDURE: Insertion of radioactive I-125 seeds into the prostate gland.  RADIATION DOSE: 145 Gy, definitive therapy.  TECHNIQUE: CARLAN POLACEK was brought to the operating room with the urologist. He was placed in the dorsolithotomy position. He was catheterized and a rectal tube was inserted. The perineum was shaved, prepped and draped. The ultrasound probe was then introduced into the rectum to see the prostate gland.  TREATMENT DEVICE: A needle grid was attached to the ultrasound probe stand and anchor needles were placed.  3D PLANNING: The prostate was imaged in 3D using a sagittal sweep of the prostate probe. These images were transferred to the planning computer. There, the prostate, urethra and rectum were defined on each axial reconstructed image. Then, the software created an optimized 3D plan and a few seed positions were adjusted. The quality of the plan was reviewed using [facility] information for the target and the following two organs at risk:  Urethra and Rectum.  Then the accepted plan was uploaded to the seed Selectron afterloading unit.  PROSTATE VOLUME STUDY:  Using transrectal ultrasound the volume of the prostate was verified to be 45 cc.  SPECIAL TREATMENT PROCEDURE/SUPERVISION AND HANDLING: The Nucletron FIRST system was used to place the needles under sagittal guidance. A total of 24 needles were used to deposit 82 seeds in the prostate gland. The individual seed activity was 0.442 mCi.  COMPLEX SIMULATION: At the end of the procedure, an anterior radiograph of the pelvis was obtained to document seed positioning and count.  Cystoscopy was performed to check the urethra and bladder.  MICRODOSIMETRY: At the end of the procedure, the patient was emitting 0.063 mR/hr at 1 meter. Accordingly, he was considered safe for hospital discharge.  PLAN: The patient will return to the radiation oncology clinic for post implant CT dosimetry in three weeks.   ________________________________  Sheral Apley Tammi Klippel, M.D.

## 2016-01-13 NOTE — Op Note (Signed)
Preoperative diagnosis: Clinical stage TI C adenocarcinoma the prostate  Postoperative diagnosis: Same  Procedure: I-125 prostate seed implantation with Nucletron robotic implanter  Surgeon: Bernestine Amass M.D. , Ledon Snare Anesthesia: Gen.  Indications: Patient  was diagnosed with clinical stage TI C prostate cancer. We had extensive discussion with him about treatment options versus. He elected to proceed with seed implantation. He underwent consultation my office as well as with Dr. Arloa Koh. He appeared to understand the advantages disadvantages potential risks of this treatment option. Full informed consent has been obtained. The patient is had preoperative ciprofloxacin. PAS compression boots were placed.  Technique and findings: Patient was brought the operating room where he had successful induction of general anesthesia. He was placed in lithotomy position and prepped and draped in usual manner. Appropriate surgical timeout was performed. Radiation oncology department placed a transrectal ultrasound probe anchoring stand. Foley catheter with contrast in the balloon was inserted without difficulty. Anchoring needles were placed within the prostate. Real-time contouring of the urethra prostate and rectum were performed and the dosing parameters were established. Targeted dose was 135 gray. We then came to the operating suite suite for placement of the needles. A second timeout was performed. All needle passage was done with real-time transrectal ultrasound guidance with the sagittal plane. A total of 24 needles were placed. See implantation itself was done with the robotic implanter. 82 active seeds were implanted. A Foley catheter was removed and flexible cystoscopy failed to show any seeds outside the prostate. The Foley catheter was inserted which drained clear urine. The patient was brought to recovery room in stable condition.

## 2016-01-13 NOTE — Anesthesia Preprocedure Evaluation (Signed)
Anesthesia Evaluation  Patient identified by MRN, date of birth, ID band Patient awake    Reviewed: Allergy & Precautions, NPO status , Patient's Chart, lab work & pertinent test results  Airway Mallampati: II  TM Distance: >3 FB Neck ROM: Full    Dental no notable dental hx.    Pulmonary neg pulmonary ROS,    Pulmonary exam normal breath sounds clear to auscultation       Cardiovascular hypertension, Pt. on medications + CAD and + Peripheral Vascular Disease  Normal cardiovascular exam Rhythm:Regular Rate:Normal     Neuro/Psych TIAnegative psych ROS   GI/Hepatic Neg liver ROS, GERD  Medicated,  Endo/Other  negative endocrine ROS  Renal/GU negative Renal ROS  negative genitourinary   Musculoskeletal negative musculoskeletal ROS (+)   Abdominal (+) + obese,   Peds negative pediatric ROS (+)  Hematology negative hematology ROS (+)   Anesthesia Other Findings   Reproductive/Obstetrics negative OB ROS                             Anesthesia Physical Anesthesia Plan  ASA: III  Anesthesia Plan: General   Post-op Pain Management:    Induction: Intravenous  Airway Management Planned: LMA  Additional Equipment:   Intra-op Plan:   Post-operative Plan: Extubation in OR  Informed Consent: I have reviewed the patients History and Physical, chart, labs and discussed the procedure including the risks, benefits and alternatives for the proposed anesthesia with the patient or authorized representative who has indicated his/her understanding and acceptance.   Dental advisory given  Plan Discussed with: CRNA  Anesthesia Plan Comments:         Anesthesia Quick Evaluation

## 2016-01-13 NOTE — Interval H&P Note (Signed)
History and Physical Interval Note:  01/13/2016 12:47 PM  Tyler Guerra  has presented today for surgery, with the diagnosis of PROSTATE CANCER  The various methods of treatment have been discussed with the patient and family. After consideration of risks, benefits and other options for treatment, the patient has consented to  Procedure(s): RADIOACTIVE SEED IMPLANT/BRACHYTHERAPY IMPLANT (N/A) as a surgical intervention .  The patient's history has been reviewed, patient examined, no change in status, stable for surgery.  I have reviewed the patient's chart and labs.  Questions were answered to the patient's satisfaction.     Alyzza Andringa S

## 2016-01-13 NOTE — Anesthesia Procedure Notes (Signed)
Procedure Name: LMA Insertion Date/Time: 01/13/2016 1:09 PM Performed by: Mechele Claude Pre-anesthesia Checklist: Patient identified, Emergency Drugs available, Suction available and Patient being monitored Patient Re-evaluated:Patient Re-evaluated prior to inductionOxygen Delivery Method: Circle System Utilized Preoxygenation: Pre-oxygenation with 100% oxygen Intubation Type: IV induction Ventilation: Mask ventilation without difficulty LMA: LMA inserted LMA Size: 4.0 Number of attempts: 1 Airway Equipment and Method: bite block Placement Confirmation: positive ETCO2 Tube secured with: Tape Dental Injury: Teeth and Oropharynx as per pre-operative assessment

## 2016-01-14 ENCOUNTER — Encounter (HOSPITAL_BASED_OUTPATIENT_CLINIC_OR_DEPARTMENT_OTHER): Payer: Self-pay | Admitting: Urology

## 2016-02-12 ENCOUNTER — Telehealth: Payer: Self-pay | Admitting: *Deleted

## 2016-02-12 NOTE — Telephone Encounter (Signed)
CALLED PATIENT TO REMIND OF APPTS. FOR 02-13-16, SPOKE WITH PATIENT'S WIFE [name] AND SHE IS AWARE OF THESE APPTS.

## 2016-02-13 ENCOUNTER — Ambulatory Visit
Admit: 2016-02-13 | Discharge: 2016-02-13 | Disposition: A | Discharge status: Home / Self Care | Payer: BLUE CROSS/BLUE SHIELD | Attending: Radiation Oncology | Admitting: Radiation Oncology

## 2016-02-13 ENCOUNTER — Ambulatory Visit
Admission: RE | Admit: 2016-02-13 | Discharge: 2016-02-13 | Disposition: A | Discharge status: Home / Self Care | Payer: BLUE CROSS/BLUE SHIELD | Source: Ambulatory Visit | Attending: Radiation Oncology | Admitting: Radiation Oncology

## 2016-02-13 ENCOUNTER — Encounter: Payer: Self-pay | Admitting: Radiation Oncology

## 2016-02-13 VITALS — BP 149/90 | HR 69 | Resp 16 | Wt 222.2 lb

## 2016-02-13 DIAGNOSIS — C61 Malignant neoplasm of prostate: Secondary | ICD-10-CM

## 2016-02-13 DIAGNOSIS — R351 Nocturia: Secondary | ICD-10-CM | POA: Insufficient documentation

## 2016-02-13 NOTE — Progress Notes (Signed)
Weight and vitals stable. Denies pain. Pre seed IPSS 17. Post seed IPSS 31. Reports difficulty emptying his bladder. Reports urinary frequency. Reports dysuria. Denies urinary incontinence or leakage. Denies hematuria. Reports great anxiety associated with urinary symptoms at night. Reports he is trying rapaflo to see if it works better than flomax but, thus far he doesn't see any difference.  BP 149/90 mmHg  Pulse 69  Resp 16  Wt 222 lb 3.2 oz (100.789 kg)  SpO2 100% Wt Readings from Last 3 Encounters:  02/13/16 222 lb 3.2 oz (100.789 kg)  01/13/16 219 lb (99.338 kg)  11/25/15 231 lb 8 oz (105.008 kg)

## 2016-02-13 NOTE — Progress Notes (Signed)
  Radiation Oncology         [phone] ________________________________  Name: SIDY CURTNER MRN: [id number]  Date: 02/13/2016  DOB: 04/19/1951  COMPLEX SIMULATION NOTE  NARRATIVE:  The patient was brought to the [facility] today following prostate seed implantation approximately one month ago.  Identity was confirmed.  All relevant records and images related to the planned course of therapy were reviewed.  Then, the patient was set-up supine.  CT images were obtained.  The CT images were loaded into the planning software.  Then the prostate and rectum were contoured.  Treatment planning then occurred.  The implanted iodine 125 seeds were identified by the physics staff for projection of radiation distribution  I have requested : 3D Simulation  I have requested a DVH of the following structures: Prostate and rectum.    ________________________________  Sheral Apley Tammi Klippel, M.D.  This document serves as a record of services personally performed by Tyler Pita, MD. It was created on his behalf by Truddie Hidden, a trained medical scribe. The creation of this record is based on the scribe's personal observations and the provider's statements to them. This document has been checked and approved by the attending provider.

## 2016-02-13 NOTE — Progress Notes (Addendum)
Radiation Oncology         [phone] ________________________________  Name: Tyler Guerra MRN: [id number]  Date: 02/13/2016  DOB: Jul 11, 1951  Follow-Up Visit Note  CC: Elsie Stain, MD  Rana Snare, MD  Diagnosis:  Stage T1c intermediate risk, adenocarcinoma of the prostate with a Gleason's score of 3+4 and a PSA of 4.03     ICD-9-CM ICD-10-CM   1. Prostate cancer [facility]) Ava     Narrative:  Mr. Bellissimo was taken to the operating room on 01/13/2016 where he underwent radioactive seed implant with Dr. Tammi Klippel and Dr. Risa Grill. He comes today for follow-up since having this performed. On review of systems, the patient reports that he is experiencing nocturia up to 5 or 6 times each night, urinary frequency, and weak stream. He has been using Rapaflo and plans to follow-up with Dr. Risa Grill as this does not seem to be effecting his symptoms much. He is considering slowing down with traveling for his job currently due to this. He denies any bowel dysfunction. He is not experiencing any dysuria hematuria, fevers or chills. No other complaints or verbalized.                            ALLERGIES:  has No Known Allergies.  Meds: Current Outpatient Prescriptions  Medication Sig Dispense Refill  . allopurinol (ZYLOPRIM) 300 MG tablet TAKE ONE TABLET BY MOUTH ONCE DAILY (Patient taking differently: Take 300 mg by mouth every morning. TAKE ONE TABLET BY MOUTH ONCE DAILY) 90 tablet 3  . aspirin 81 MG tablet Take 81 mg by mouth daily.    [name] atorvastatin (LIPITOR) 40 MG tablet Take 1 tablet (40 mg total) by mouth daily. (Patient taking differently: Take 40 mg by mouth every evening. ) 90 tablet 3  . hydrochlorothiazide (HYDRODIURIL) 25 MG tablet TAKE ONE TABLET BY MOUTH ONCE DAILY (Patient taking differently: Take 25 mg by mouth every morning. TAKE ONE TABLET BY MOUTH ONCE DAILY) 90 tablet 3  . omeprazole (PRILOSEC) 20 MG capsule Take 20 mg by mouth every morning.    . silodosin (RAPAFLO) 4 MG CAPS  capsule Take 4 mg by mouth daily with breakfast.    . tamsulosin (FLOMAX) 0.4 MG CAPS capsule Take 0.4 mg by mouth.    . triamcinolone cream (KENALOG) 0.1 % Apply 1 application topically at bedtime. Use without a glove initially.  If not responding then use with a glove. (Patient not taking: Reported on 02/13/2016) 30 g 2   No current facility-administered medications for this encounter.    Physical Findings:  weight is 222 lb 3.2 oz (100.789 kg). His blood pressure is 149/90 and his pulse is 69. His respiration is 16 and oxygen saturation is 100%.   In general this is a well appearing Caucasian male in no acute distress. He's alert and oriented x4 and appropriate throughout the examination. Cardiopulmonary assessment is negative for acute distress and he exhibits normal effort.    Lab Findings: Lab Results  Component Value Date   WBC 7.1 01/06/2016   HGB 14.4 01/06/2016   HCT 41.3 01/06/2016   PLT 200 01/06/2016    Lab Results  Component Value Date   NA 140 01/06/2016   K 3.7 01/06/2016   CO2 26 01/06/2016   GLUCOSE 141* 01/06/2016   GLUCOSE 91 08/06/2010   BUN 20 01/06/2016   CREATININE 1.15 01/06/2016   BILITOT 0.7 01/06/2016   ALKPHOS 97 01/06/2016  AST 21 01/06/2016   ALT 21 01/06/2016   PROT 7.3 01/06/2016   ALBUMIN 4.1 01/06/2016   CALCIUM 9.2 01/06/2016   ANIONGAP 8 01/06/2016    Radiographic Findings: No results found.  Impression/Plan: 1. Stage T1c intermediate risk adenocarcinoma of the prostate with a Gleason's score of 3+4 and a PSA of 4.03. The patient has undergone radioactive seed implant. He is having anticipated side effects of nocturia, frequency, and weak stream. He is currently receiving rapaflow from Dr. Risa Grill. He will continue to follow up with Dr. Risa Grill for further management of this as well as for repeat PSA evaluations. Dr. Tammi Klippel discusses the long term side effects of radiotherapy and we would be happy to see him back if there are any  questions or concerns regarding his treatment.     Carola Rhine, [facility] Seen with and dictated for Sheral Apley. Tammi Klippel, MD

## 2016-02-18 ENCOUNTER — Encounter: Payer: Self-pay | Admitting: Radiation Oncology

## 2016-02-18 ENCOUNTER — Ambulatory Visit
Admission: RE | Admit: 2016-02-18 | Discharge: 2016-02-18 | Disposition: A | Discharge status: Home / Self Care | Payer: BLUE CROSS/BLUE SHIELD | Source: Ambulatory Visit | Attending: Radiation Oncology | Admitting: Radiation Oncology

## 2016-02-18 DIAGNOSIS — Z510 Encounter for antineoplastic radiation therapy: Secondary | ICD-10-CM | POA: Insufficient documentation

## 2016-02-18 DIAGNOSIS — C61 Malignant neoplasm of prostate: Secondary | ICD-10-CM | POA: Diagnosis not present

## 2016-02-18 NOTE — Addendum Note (Signed)
Encounter addended by: Heywood Footman, RN on: 02/18/2016 11:06 AM<BR>     Documentation filed: Charges VN

## 2016-02-24 DIAGNOSIS — Z961 Presence of intraocular lens: Secondary | ICD-10-CM | POA: Insufficient documentation

## 2016-02-24 NOTE — Progress Notes (Signed)
  Radiation Oncology         [phone] ________________________________  Name: Tyler Guerra MRN: [id number]  Date: 02/18/2016  DOB: June 24, 1951  3D Planning Note   Prostate Brachytherapy Post-Implant Dosimetry  Diagnosis: 65 y.o. gentleman with stage T1c adenocarcinoma of the prostate with a Gleason's score of 3+4 and a PSA of 4.03.  Narrative: On a previous date, Tyler Guerra returned following prostate seed implantation for post implant planning. He underwent CT scan complex simulation to delineate the three-dimensional structures of the pelvis and demonstrate the radiation distribution.  Since that time, the seed localization, and complex isodose planning with dose volume histograms have now been completed.  Results:   Prostate Coverage - The dose of radiation delivered to the 90% or more of the prostate gland (D90) was 114.86% of the prescription dose. This exceeds our goal of greater than 90%. Rectal Sparing - The volume of rectal tissue receiving the prescription dose or higher was 0.0 cc. This falls under our thresholds tolerance of 1.0 cc.  Impression: The prostate seed implant appears to show adequate target coverage and appropriate rectal sparing.  Plan:  The patient will continue to follow with urology for ongoing PSA determinations. I would anticipate a high likelihood for local tumor control with minimal risk for rectal morbidity.  ________________________________  Sheral Apley Tammi Klippel, M.D.

## 2016-03-16 ENCOUNTER — Encounter: Payer: Self-pay | Admitting: Family Medicine

## 2016-03-16 ENCOUNTER — Ambulatory Visit (INDEPENDENT_AMBULATORY_CARE_PROVIDER_SITE_OTHER): Payer: BLUE CROSS/BLUE SHIELD | Admitting: Family Medicine

## 2016-03-16 VITALS — BP 122/84 | HR 71 | Temp 98.7°F | Wt 213.8 lb

## 2016-03-16 DIAGNOSIS — F411 Generalized anxiety disorder: Secondary | ICD-10-CM | POA: Diagnosis not present

## 2016-03-16 DIAGNOSIS — C61 Malignant neoplasm of prostate: Secondary | ICD-10-CM

## 2016-03-16 HISTORY — DX: Generalized anxiety disorder: F41.1

## 2016-03-16 MED ORDER — POLYETHYLENE GLYCOL 3350 17 GM/SCOOP PO POWD: 17.0000 g | Freq: Every day | ORAL | Status: DC | PRN

## 2016-03-16 MED ORDER — ESCITALOPRAM OXALATE 10 MG PO TABS: 10.0000 mg | ORAL_TABLET | Freq: Every day | ORAL | 1 refills | Status: DC

## 2016-03-16 MED ORDER — DIAZEPAM 5 MG PO TABS
2.5000 mg | ORAL_TABLET | Freq: Two times a day (BID) | ORAL | 0 refills | Status: DC | PRN
Start: 2016-03-16 — End: 2018-09-02

## 2016-03-16 NOTE — Patient Instructions (Signed)
Take lexapro daily, then use valium only if needed.   Update me in a few days.  I'll check with Dr. Risa Grill in the meantime.  Take care.  Always glad to see you.

## 2016-03-16 NOTE — Progress Notes (Signed)
Pre visit review using our clinic review tool, if applicable. No additional management support is needed unless otherwise documented below in the visit note. 

## 2016-03-16 NOTE — Assessment & Plan Note (Addendum)
See above re: urinary sx.  Would continue of flomax for now, off rapaflo for now since he didn't notice a big difference in the meantime.  He agrees.  I'll check with urology in the meantime.    Call back from urology RN- Dr. Risa Grill is out of the office today.  His urinary sx aren't urgent at this point and he doesn't need uro eval today, so it should be okay for patient to see urology later on.  We'll check up on patient later on this week, if he doesn't contact us first.

## 2016-03-16 NOTE — Progress Notes (Signed)
"  I'm not in a good place."  He has had changes in urination since prostate seeding was done.  He has seen uro in the meantime.    He was asked about back pain at the last uro visit, didn't have pain at the time, but then started having back pain thereafter.  Had noted some splitting of his urinary stream.  He drank a lot of water and then the stream splitting and back pain resolved.  At that point, he stopped his allopurinol. He wanted to minimize meds, if possible.  He didn't see a change between rapaflo and flomax, in terms of urination.  He stopped rapaflow in the meantime.  Still on flomax.   He has to urinate about every 45 to 60 minutes during the day, with variable stream strength.  His sleep is fragmented in the meantime, getting up every few hours to urinate.  He has to double or triple void at night when he gets up mult times per night.    He has noted some constipation in the meantime, smaller BMs in the meantime.  No blood in stool.  Now with BMs not daily.  He was anxious about all of the changes.  He has used dulcolax prn in the meantime.    He is getting profoundly anxious about any changes in his routine.  No SI/HI.  He has considered suicide prev, but not now, not actively.  Weight is down in the meantime.  He contracts for safety.    Appetite is decreased, likely affecting his BMs.  D/w pt.    Father with hx of anxiety.    Meds, vitals, and allergies reviewed.   ROS: Per HPI unless specifically indicated in ROS section   GEN: nad, alert and oriented but tearful, worried appearing, anxious appearing.  He is able to regain composure.  HEENT: mucous membranes moist NECK: supple w/o LA CV: rrr PULM: ctab, no inc wob ABD: soft, +bs

## 2016-03-16 NOTE — Assessment & Plan Note (Addendum)
He is getting profoundly anxious about any changes in his routine.  No SI/HI.  He has considered suicide prev, but not now, not actively.  Weight is down in the meantime.  He contracts for safety.  At this point, okay for outpatient f/u.  Would start lexapro, with routine cautions.  Can use prn valium, #10 rx given to patient.  He knows this is just for PRN use with sedation caution, esp re: driving.  He'll update me in a few days.    I called uro about his situation and asked for call back, await call back.  I think all the upheaval is making him more worried, with that "snowballing" and compounding other issues, ie dysuria---> affecting sleep---> fatigue/excess worry--->appetite changes--->constipation--->excess worry, etc.  D/w pt.  He agrees.    >25 minutes spent in face to face time with patient, >50% spent in counselling or coordination of care.

## 2016-03-20 ENCOUNTER — Telehealth: Payer: Self-pay | Admitting: Family Medicine

## 2016-03-20 NOTE — Telephone Encounter (Signed)
Patient advised.   Patient states he made a mistake and was taking the medication twice daily but now realizes it is only once daily.  Patient will give [place] an update next week.

## 2016-03-20 NOTE — Telephone Encounter (Signed)
Please get update from patient re: mood with recent med changes.  Thanks.

## 2016-03-20 NOTE — Telephone Encounter (Signed)
I would still continue for now and see if that gradually becomes less noticeable for patient.  I think the potential benefit still outweighs the risk. Please have him update [place] next week.  Thanks.

## 2016-03-20 NOTE — Telephone Encounter (Signed)
Pt states that after taking lexapro he experience blurry vision for about an hour, he would like to know if he should continue taking med. Please advise, thanks.

## 2016-03-22 ENCOUNTER — Encounter (HOSPITAL_COMMUNITY): Payer: Self-pay

## 2016-03-22 ENCOUNTER — Emergency Department (HOSPITAL_COMMUNITY)
Admission: EM | Admit: 2016-03-22 | Discharge: 2016-03-22 | Disposition: A | Discharge status: Home / Self Care | Payer: BLUE CROSS/BLUE SHIELD | Source: Home / Self Care | Attending: Emergency Medicine | Admitting: Emergency Medicine

## 2016-03-22 ENCOUNTER — Emergency Department (HOSPITAL_COMMUNITY)
Admission: EM | Admit: 2016-03-22 | Discharge: 2016-03-22 | Disposition: A | Discharge status: Home / Self Care | Payer: BLUE CROSS/BLUE SHIELD | Attending: Emergency Medicine | Admitting: Emergency Medicine

## 2016-03-22 ENCOUNTER — Encounter (HOSPITAL_COMMUNITY): Payer: Self-pay | Admitting: Emergency Medicine

## 2016-03-22 DIAGNOSIS — I2510 Atherosclerotic heart disease of native coronary artery without angina pectoris: Secondary | ICD-10-CM

## 2016-03-22 DIAGNOSIS — Z8546 Personal history of malignant neoplasm of prostate: Secondary | ICD-10-CM

## 2016-03-22 DIAGNOSIS — I10 Essential (primary) hypertension: Secondary | ICD-10-CM

## 2016-03-22 DIAGNOSIS — Z7982 Long term (current) use of aspirin: Secondary | ICD-10-CM

## 2016-03-22 DIAGNOSIS — Z79899 Other long term (current) drug therapy: Secondary | ICD-10-CM | POA: Insufficient documentation

## 2016-03-22 DIAGNOSIS — R339 Retention of urine, unspecified: Secondary | ICD-10-CM | POA: Insufficient documentation

## 2016-03-22 DIAGNOSIS — Z955 Presence of coronary angioplasty implant and graft: Secondary | ICD-10-CM

## 2016-03-22 DIAGNOSIS — E876 Hypokalemia: Secondary | ICD-10-CM

## 2016-03-22 DIAGNOSIS — R338 Other retention of urine: Secondary | ICD-10-CM

## 2016-03-22 DIAGNOSIS — R1030 Lower abdominal pain, unspecified: Secondary | ICD-10-CM | POA: Diagnosis present

## 2016-03-22 DIAGNOSIS — N390 Urinary tract infection, site not specified: Secondary | ICD-10-CM | POA: Insufficient documentation

## 2016-03-22 LAB — CBC WITH DIFFERENTIAL/PLATELET
BASOS ABS: 0 10*3/uL (ref 0.0–0.1)
Basophils Relative: 0 %
EOS ABS: 0 10*3/uL (ref 0.0–0.7)
Eosinophils Relative: 0 %
HCT: 41.3 % (ref 39.0–52.0)
HEMOGLOBIN: 14.5 g/dL (ref 13.0–17.0)
LYMPHS ABS: 0.8 10*3/uL (ref 0.7–4.0)
LYMPHS PCT: 7 %
MCH: 30 pg (ref 26.0–34.0)
MCHC: 35.1 g/dL (ref 30.0–36.0)
MCV: 85.5 fL (ref 78.0–100.0)
Monocytes Absolute: 0.7 10*3/uL (ref 0.1–1.0)
Monocytes Relative: 7 %
NEUTROS PCT: 86 %
Neutro Abs: 8.8 10*3/uL — ABNORMAL HIGH (ref 1.7–7.7)
PLATELETS: 211 10*3/uL (ref 150–400)
RBC: 4.83 MIL/uL (ref 4.22–5.81)
RDW: 13.1 % (ref 11.5–15.5)
WBC: 10.3 10*3/uL (ref 4.0–10.5)

## 2016-03-22 LAB — URINALYSIS, ROUTINE W REFLEX MICROSCOPIC
Bilirubin Urine: NEGATIVE
GLUCOSE, UA: NEGATIVE mg/dL
Ketones, ur: NEGATIVE mg/dL
Nitrite: NEGATIVE
PH: 6 (ref 5.0–8.0)
Protein, ur: 30 mg/dL — AB
Specific Gravity, Urine: 1.022 (ref 1.005–1.030)

## 2016-03-22 LAB — I-STAT CHEM 8, ED
BUN: 22 mg/dL — ABNORMAL HIGH (ref 6–20)
BUN: 30 mg/dL — ABNORMAL HIGH (ref 6–20)
CHLORIDE: 101 mmol/L (ref 101–111)
CREATININE: 1.1 mg/dL (ref 0.61–1.24)
Calcium, Ion: 1.11 mmol/L — ABNORMAL LOW (ref 1.12–1.23)
Calcium, Ion: 1.12 mmol/L (ref 1.12–1.23)
Chloride: 99 mmol/L — ABNORMAL LOW (ref 101–111)
Creatinine, Ser: 1.2 mg/dL (ref 0.61–1.24)
GLUCOSE: 132 mg/dL — AB (ref 65–99)
Glucose, Bld: 145 mg/dL — ABNORMAL HIGH (ref 65–99)
HCT: 42 % (ref 39.0–52.0)
HCT: 44 % (ref 39.0–52.0)
HEMOGLOBIN: 14.3 g/dL (ref 13.0–17.0)
HEMOGLOBIN: 15 g/dL (ref 13.0–17.0)
Potassium: 3 mmol/L — ABNORMAL LOW (ref 3.5–5.1)
Potassium: 3.5 mmol/L (ref 3.5–5.1)
SODIUM: 136 mmol/L (ref 135–145)
Sodium: 137 mmol/L (ref 135–145)
TCO2: 22 mmol/L (ref 0–100)
TCO2: 20 mmol/L (ref 0–100)

## 2016-03-22 LAB — URINE MICROSCOPIC-ADD ON

## 2016-03-22 MED ORDER — PHENAZOPYRIDINE HCL 200 MG PO TABS: 200.0000 mg | ORAL_TABLET | Freq: Three times a day (TID) | ORAL | 0 refills | Status: DC

## 2016-03-22 MED ORDER — LIDOCAINE HCL 2 % EX GEL
1.0000 "application " | Freq: Once | CUTANEOUS | Status: AC
  Administered 2016-03-22: 1 via URETHRAL
  Filled 2016-03-22: qty 11

## 2016-03-22 MED ORDER — POTASSIUM CHLORIDE CRYS ER 20 MEQ PO TBCR: 40.0000 meq | EXTENDED_RELEASE_TABLET | Freq: Every day | ORAL | 0 refills | Status: DC

## 2016-03-22 MED ORDER — CIPROFLOXACIN HCL 500 MG PO TABS: 500.0000 mg | ORAL_TABLET | Freq: Two times a day (BID) | ORAL | 0 refills | Status: DC

## 2016-03-22 MED ORDER — HYDROMORPHONE HCL 2 MG/ML IJ SOLN
2.0000 mg | Freq: Once | INTRAMUSCULAR | Status: AC
  Administered 2016-03-22: 2 mg via INTRAMUSCULAR
  Filled 2016-03-22: qty 1

## 2016-03-22 MED ORDER — POTASSIUM CHLORIDE CRYS ER 20 MEQ PO TBCR
40.0000 meq | EXTENDED_RELEASE_TABLET | Freq: Once | ORAL | Status: AC
  Administered 2016-03-22: 40 meq via ORAL
  Filled 2016-03-22: qty 2

## 2016-03-22 MED ORDER — OXYCODONE HCL 5 MG PO TABS
5.0000 mg | ORAL_TABLET | Freq: Once | ORAL | Status: AC
  Administered 2016-03-22: 5 mg via ORAL
  Filled 2016-03-22: qty 1

## 2016-03-22 NOTE — ED Triage Notes (Signed)
Patient states that he came back due to not being able to get any comfort.  He states he is having constant pain in lower abd/bladder and penial areas.  Patient is pacing around triage room and grabbing at his genitalia.  Patient had Valium 5mg  about an hour ago trying to get some relief and able to relax.

## 2016-03-22 NOTE — ED Notes (Signed)
Bladder scan 468ml. Notified EDP

## 2016-03-22 NOTE — ED Provider Notes (Signed)
[facility] DEPT Provider Note   CSN: [id number] Arrival date & time: 03/22/16  0137     History   Chief Complaint Chief Complaint  Patient presents with  . Urinary Retention    HPI Tyler Guerra is a 65 y.o. male.  HPI   Tyler Guerra is a 65 y.o. male with PMH significant for CAD, HLD, HTN, BPH, prostate cancer s/p radioactive seed implantation January 13, 2016 who presents with urinary retention.  Patient reports he has been experiencing urinary retention since his procedure in June, but over the last week or so it has gotten much worse.  He states he has not been able to completely void, and just notes dribbles at a time.  Associated symptoms include dysuria and incomplete voiding.  No fever, chills, N/V/D, or abdominal pain.  No treatments tried PTA.  No modifying or aggravating factors.  Urologist is Dr. Risa Grill.   Past Medical History:  Diagnosis Date  . Benign localized prostatic hyperplasia with lower urinary tract symptoms (LUTS)   . Carotid artery stenosis    PER DUPLEX 10-22-2015  BILATERAL ICA 1-39%  . Coronary artery disease    CARDIOLOGIST-  DR Daneen Schick--  VISIT EVERY OTHER YEAR-- [facility] 2015  . GERD (gastroesophageal reflux disease)   . Gout   . H/O eye injury    chronic changes to left eye after injury  . History of TIA (transient ischemic attack)    03/ 2017  no residual after brief episode loss peripheral vision  . Hyperlipemia   . Hypertension   . Prostate cancer ([place]) UROLOGIST-  DR GRAPEY/  ONCOLOGIST-  DR MANNING   dx 2015--- Stage T1c, Gleason 3+4, PSA 4.03, vol 44cc  . PVC's (premature ventricular contractions)   . 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 Wears glasses     Patient Active Problem List   Diagnosis Date Noted  . Anxiety state 03/16/2016  . TIA (transient ischemic attack) 10/14/2015  . Rash and nonspecific skin eruption 10/14/2015  . Skin lesion 10/14/2015  . BPV (benign positional vertigo) 11/02/2014  . Advance care planning 05/13/2014    . 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 Hearing loss 04/21/2013  . Ventral hernia 10/26/2011  . Routine general medical examination at a health care facility 10/26/2011  . Prostate cancer ([place]) 10/26/2011  . Coronary atherosclerosis 07/19/2010  . GERD 07/19/2010  . Hyperlipidemia 07/18/2010  . Gout 07/18/2010  . HYPERTENSION, BENIGN ESSENTIAL 07/18/2010    Past Surgical History:  Procedure Laterality Date  . APPENDECTOMY  1975  . CARDIAC CATHETERIZATION  12-22-2001   dr Daneen Schick   severe native vessel disease mLAD 60-70%,  total occulsion pCFX  and pRCA/  widely patent saphenous vein , free radial , and LIMA grafts/  minminal lv dysfunction, ef 60%  . CORONARY ARTERY BYPASS GRAFT  May 2000   LIMA to LAD,  SVG to PDA and Diagonal, Free radial graft to OM  . Exericse treadmill test  last one 01-03-2014  dr Daneen Schick   normal exercise tolerance w/ hypertensive repsonse,  no ischemic EKG changes, appropriate HR response & recovery (Duke TM score 9;  Low Risk , PVC's w/ exertion)  . PROSTATE BIOPSY    . RADIOACTIVE SEED IMPLANT N/A 01/13/2016   Procedure: RADIOACTIVE SEED IMPLANT/BRACHYTHERAPY IMPLANT;  Surgeon: Rana Snare, MD;  Location: [facility];  Service: Urology;  Laterality: N/A;       Home Medications  Prior to Admission medications   Medication Sig Start Date End Date Taking? Authorizing Provider  aspirin 81 MG tablet Take 81 mg by mouth daily.    Historical Provider, MD  atorvastatin (LIPITOR) 40 MG tablet Take 1 tablet (40 mg total) by mouth daily. Patient taking differently: Take 40 mg by mouth every evening.  11/05/14   Tonia Ghent, MD  ciprofloxacin (CIPRO) 500 MG tablet Take 1 tablet (500 mg total) by mouth 2 (two) times daily. 03/22/16   Gloriann Loan, PA-C  diazepam (VALIUM) 5 MG tablet Take 0.5-1 tablets (2.5-5 mg total) by mouth 2 (two) times daily as needed for anxiety. 03/16/16   Tonia Ghent, MD  escitalopram (LEXAPRO)  10 MG tablet Take 1 tablet (10 mg total) by mouth daily. 03/16/16   Tonia Ghent, MD  esomeprazole (NEXIUM) 20 MG capsule Take 20 mg by mouth daily at 12 noon.    Historical Provider, MD  hydrochlorothiazide (HYDRODIURIL) 25 MG tablet TAKE ONE TABLET BY MOUTH ONCE DAILY Patient taking differently: Take 25 mg by mouth every morning. TAKE ONE TABLET BY MOUTH ONCE DAILY 03/21/15   Tonia Ghent, MD  ketotifen (ZADITOR) 0.025 % ophthalmic solution 1 drop as needed.    Historical Provider, MD  phenazopyridine (PYRIDIUM) 200 MG tablet Take 1 tablet (200 mg total) by mouth 3 (three) times daily with meals. 03/22/16   Gloriann Loan, PA-C  polyethylene glycol powder (GLYCOLAX/MIRALAX) powder Take 17 g by mouth daily as needed. 03/16/16   Tonia Ghent, MD  potassium chloride SA (K-DUR,KLOR-CON) 20 MEQ tablet Take 2 tablets (40 mEq total) by mouth daily. 03/22/16   Gloriann Loan, PA-C  sodium chloride (OCEAN) 0.65 % SOLN nasal spray Place 1 spray into both nostrils as needed for congestion.    Historical Provider, MD  tamsulosin (FLOMAX) 0.4 MG CAPS capsule Take 0.4 mg by mouth.    Historical Provider, MD  triamcinolone cream (KENALOG) 0.1 % Apply 1 application topically at bedtime. Use without a glove initially.  If not responding then use with a glove. 11/25/15   Tonia Ghent, MD    Family History Family History  Problem Relation Age of Onset  . Cancer Father     prostate in eighties  . Prostate cancer Father   . Heart disease Father   . Stroke Father   . Dementia Mother   . Cancer Sister     breast  . Cancer Paternal Uncle     prostate  . Colon cancer Neg Hx     Social History Social History  Substance Use Topics  . Smoking status: Never Smoker  . Smokeless tobacco: Never Used  . Alcohol use No     Allergies   Review of patient's allergies indicates no known allergies.   Review of Systems Review of Systems All other systems negative unless otherwise stated in HPI   Physical  Exam Updated Vital Signs BP 130/98 (BP Location: Right Arm)   Pulse 82   Temp 98.7 F (37.1 C) (Oral)   Resp 18   Ht 5\' 10"  (1.778 m)   Wt 96.6 kg   SpO2 100%   BMI 30.56 kg/m   Physical Exam  Constitutional: He is oriented to person, place, and time. He appears well-developed and well-nourished.  Non-toxic appearance. He does not have a sickly appearance. He does not appear ill.  HENT:  Head: Normocephalic and atraumatic.  Mouth/Throat: Oropharynx is clear and moist.  Eyes: Conjunctivae are normal. Pupils are  equal, round, and reactive to light.  Neck: Normal range of motion. Neck supple.  Cardiovascular: Normal rate and regular rhythm.   Pulmonary/Chest: Effort normal and breath sounds normal. No accessory muscle usage or stridor. No respiratory distress. He has no wheezes. He has no rhonchi. He has no rales.  Abdominal: Soft. Bowel sounds are normal. He exhibits distension. There is no tenderness. There is no rebound and no guarding.  Musculoskeletal: Normal range of motion.  Lymphadenopathy:    He has no cervical adenopathy.  Neurological: He is alert and oriented to person, place, and time.  Speech clear without dysarthria.  Skin: Skin is warm and dry.  Psychiatric: He has a normal mood and affect. His behavior is normal.     ED Treatments / Results  Labs (all labs ordered are listed, but only abnormal results are displayed) Labs Reviewed  URINALYSIS, ROUTINE W REFLEX MICROSCOPIC (NOT AT [facility]) - Abnormal; Notable for the following:       Result Value   Color, Urine AMBER (*)    APPearance CLOUDY (*)    Hgb urine dipstick LARGE (*)    Protein, ur 30 (*)    Leukocytes, UA LARGE (*)    All other components within normal limits  URINE MICROSCOPIC-ADD ON - Abnormal; Notable for the following:    Squamous Epithelial / LPF 0-5 (*)    Bacteria, UA MANY (*)    All other components within normal limits  I-STAT CHEM 8, ED - Abnormal; Notable for the following:    Potassium  3.0 (*)    Chloride 99 (*)    BUN 22 (*)    Glucose, Bld 132 (*)    Calcium, Ion 1.11 (*)    All other components within normal limits  URINE CULTURE    EKG  EKG Interpretation None       Radiology No results found.  Procedures Procedures (including critical care time)  Medications Ordered in ED Medications  oxyCODONE (Oxy IR/ROXICODONE) immediate release tablet 5 mg (5 mg Oral Given 03/22/16 0432)  potassium chloride SA (K-DUR,KLOR-CON) CR tablet 40 mEq (40 mEq Oral Given 03/22/16 0505)     Initial Impression / Assessment and Plan / ED Course  I have reviewed the triage vital signs and the nursing notes.  Pertinent labs & imaging results that were available during my care of the patient were reviewed by me and considered in my medical decision making (see chart for details).  Clinical Course   Patient presents with increased urinary frequency, dysuria, and feeling of incomplete voiding.  No fever, chills, N/V, abdominal pain, or flank pain.  Recent seed implantation for prostate cancer.  VSS, NAD.  Concern for urinary retention vs UTI.  Foley catheter inserted with purulent urine output.  Dr. Laneta Simmers performed bladder scan at bedside, no postvoid residual, empty bladder.  UA appears infectious with TNTC WBCs and many bacteria.  This is the likely cause of his symptoms.  Culture sent.  Normal renal function.  Hypokalemia, repleted in ED.  Home with Cipro, Pyridium, and potassium.  Follow up Dr. Risa Grill.  Return precautions discussed.  Stable for discharge.    Final Clinical Impressions(s) / ED Diagnoses   Final diagnoses:  UTI (lower urinary tract infection)  Hypokalemia    New Prescriptions New Prescriptions   CIPROFLOXACIN (CIPRO) 500 MG TABLET    Take 1 tablet (500 mg total) by mouth 2 (two) times daily.   PHENAZOPYRIDINE (PYRIDIUM) 200 MG TABLET    Take 1 tablet (  200 mg total) by mouth 3 (three) times daily with meals.   POTASSIUM CHLORIDE SA (K-DUR,KLOR-CON) 20 MEQ  TABLET    Take 2 tablets (40 mEq total) by mouth daily.     Gloriann Loan, PA-C 03/22/16 0507    Leo Grosser, MD 03/23/16 [id number]

## 2016-03-22 NOTE — ED Provider Notes (Signed)
[facility] DEPT Provider Note   CSN: [id number] Arrival date & time: 03/22/16  1723     History   Chief Complaint Chief Complaint  Patient presents with  . Urinary Tract Infection    HPI Tyler Guerra is a 65 y.o. male.  HPI  Pt with hx of BPH and current UTI comes in with cc of abd pain. Pt reports that he has been dribbling urine all day and has increased pain and pressure over the day, getting excruciatingly discomfort prior to ER arrival. Pt has no back pain. Pt denies any n/v/f/c.  Past Medical History:  Diagnosis Date  . Benign localized prostatic hyperplasia with lower urinary tract symptoms (LUTS)   . Carotid artery stenosis    PER DUPLEX 10-22-2015  BILATERAL ICA 1-39%  . Coronary artery disease    CARDIOLOGIST-  DR Daneen Schick--  VISIT EVERY OTHER YEAR-- [employer] 2015  . GERD (gastroesophageal reflux disease)   . Gout   . H/O eye injury    chronic changes to left eye after injury  . History of TIA (transient ischemic attack)    03/ 2017  no residual after brief episode loss peripheral vision  . Hyperlipemia   . Hypertension   . Prostate cancer ([place]) UROLOGIST-  DR GRAPEY/  ONCOLOGIST-  DR MANNING   dx 2015--- Stage T1c, Gleason 3+4, PSA 4.03, vol 44cc  . PVC's (premature ventricular contractions)   . 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 Wears glasses     Patient Active Problem List   Diagnosis Date Noted  . Anxiety state 03/16/2016  . TIA (transient ischemic attack) 10/14/2015  . Rash and nonspecific skin eruption 10/14/2015  . Skin lesion 10/14/2015  . BPV (benign positional vertigo) 11/02/2014  . Advance care planning 05/13/2014  . 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 Hearing loss 04/21/2013  . Ventral hernia 10/26/2011  . Routine general medical examination at a health care facility 10/26/2011  . Prostate cancer ([place]) 10/26/2011  . Coronary atherosclerosis 07/19/2010  . GERD 07/19/2010  . Hyperlipidemia 07/18/2010  . Gout 07/18/2010    . HYPERTENSION, BENIGN ESSENTIAL 07/18/2010    Past Surgical History:  Procedure Laterality Date  . APPENDECTOMY  1975  . CARDIAC CATHETERIZATION  12-22-2001   dr Daneen Schick   severe native vessel disease mLAD 60-70%,  total occulsion pCFX  and pRCA/  widely patent saphenous vein , free radial , and LIMA grafts/  minminal lv dysfunction, ef 60%  . CORONARY ARTERY BYPASS GRAFT  May 2000   LIMA to LAD,  SVG to PDA and Diagonal, Free radial graft to OM  . Exericse treadmill test  last one 01-03-2014  dr Daneen Schick   normal exercise tolerance w/ hypertensive repsonse,  no ischemic EKG changes, appropriate HR response & recovery (Duke TM score 9;  Low Risk , PVC's w/ exertion)  . PROSTATE BIOPSY    . RADIOACTIVE SEED IMPLANT N/A 01/13/2016   Procedure: RADIOACTIVE SEED IMPLANT/BRACHYTHERAPY IMPLANT;  Surgeon: Rana Snare, MD;  Location: [facility];  Service: Urology;  Laterality: N/A;       Home Medications    Prior to Admission medications   Medication Sig Start Date End Date Taking? Authorizing Provider  aspirin 81 MG tablet Take 81 mg by mouth daily.   Yes Historical Provider, MD  atorvastatin (LIPITOR) 40 MG tablet Take 1 tablet (40 mg total) by mouth daily. 11/05/14  Yes Tonia Ghent, MD  ciprofloxacin (CIPRO) 500 MG tablet Take 1 tablet (500 mg total) by mouth 2 (two) times daily. 03/22/16  Yes Kayla Rose, PA-C  diazepam (VALIUM) 5 MG tablet Take 0.5-1 tablets (2.5-5 mg total) by mouth 2 (two) times daily as needed for anxiety. 03/16/16  Yes Tonia Ghent, MD  escitalopram (LEXAPRO) 10 MG tablet Take 1 tablet (10 mg total) by mouth daily. 03/16/16  Yes Tonia Ghent, MD  esomeprazole (NEXIUM) 20 MG capsule Take 20 mg by mouth daily at 12 noon.   Yes Historical Provider, MD  hydrochlorothiazide (HYDRODIURIL) 25 MG tablet TAKE ONE TABLET BY MOUTH ONCE DAILY 03/21/15  Yes Tonia Ghent, MD  phenazopyridine (PYRIDIUM) 200 MG tablet Take 1 tablet (200 mg total) by  mouth 3 (three) times daily with meals. 03/22/16  Yes Kayla Rose, PA-C  polyethylene glycol powder (GLYCOLAX/MIRALAX) powder Take 17 g by mouth daily as needed. Patient taking differently: Take 17 g by mouth daily as needed for moderate constipation.  03/16/16  Yes Tonia Ghent, MD  potassium chloride SA (K-DUR,KLOR-CON) 20 MEQ tablet Take 2 tablets (40 mEq total) by mouth daily. 03/22/16  Yes Gloriann Loan, PA-C  tamsulosin (FLOMAX) 0.4 MG CAPS capsule Take 0.4 mg by mouth daily.    Yes Historical Provider, MD  sodium chloride (OCEAN) 0.65 % SOLN nasal spray Place 1 spray into both nostrils as needed for congestion.    Historical Provider, MD  triamcinolone cream (KENALOG) 0.1 % Apply 1 application topically at bedtime. Use without a glove initially.  If not responding then use with a glove. Patient not taking: Reported on 03/22/2016 11/25/15   Tonia Ghent, MD    Family History Family History  Problem Relation Age of Onset  . Cancer Father     prostate in eighties  . Prostate cancer Father   . Heart disease Father   . Stroke Father   . Dementia Mother   . Cancer Sister     breast  . Cancer Paternal Uncle     prostate  . Colon cancer Neg Hx     Social History Social History  Substance Use Topics  . Smoking status: Never Smoker  . Smokeless tobacco: Never Used  . Alcohol use No     Allergies   Review of patient's allergies indicates no known allergies.   Review of Systems Review of Systems   ROS 10 Systems reviewed and are negative for acute change except as noted in the HPI.     Physical Exam Updated Vital Signs BP 133/89   Pulse 69   Temp 98.6 F (37 C) (Oral)   Resp 16   SpO2 98%   Physical Exam  Constitutional: He is oriented to person, place, and time. He appears well-developed.  HENT:  Head: Atraumatic.  Neck: Neck supple.  Cardiovascular: Normal rate.   Pulmonary/Chest: Effort normal.  Abdominal: Soft. He exhibits distension. There is tenderness.    Suprapubic tenderness  Neurological: He is alert and oriented to person, place, and time.  Skin: Skin is warm.  Nursing note and vitals reviewed.    ED Treatments / Results  Labs (all labs ordered are listed, but only abnormal results are displayed) Labs Reviewed  CBC WITH DIFFERENTIAL/PLATELET - Abnormal; Notable for the following:       Result Value   Neutro Abs 8.8 (*)    All other components within normal limits  I-STAT CHEM 8, ED - Abnormal; Notable for the following:    BUN 30 (*)  Glucose, Bld 145 (*)    All other components within normal limits    EKG  EKG Interpretation None       Radiology No results found.  Procedures Procedures (including critical care time)  Medications Ordered in ED Medications  lidocaine (XYLOCAINE) 2 % jelly 1 application (1 application Urethral Given 03/22/16 1921)  HYDROmorphone (DILAUDID) injection 2 mg (2 mg Intramuscular Given 03/22/16 1921)     Initial Impression / Assessment and Plan / ED Course  I have reviewed the triage vital signs and the nursing notes.  Pertinent labs & imaging results that were available during my care of the patient were reviewed by me and considered in my medical decision making (see chart for details).  Clinical Course  Comment By Time  Pt on bladder scan had 480 cc of urine, Foley catheter placed and he feels a lot better. Will d/c. Repeat abd exam is not showing any tenderness. Varney Biles, MD 08/20 2149    Pt comes in with cc of abd pain, suprapubic region. Clinical concerns are for urinary retention and subsequent bladder spasm causing the pain. Will place foley catheter and then reassess. If still having pain then might consider imaging or infection/cystitis as the etiology.  Final Clinical Impressions(s) / ED Diagnoses   Final diagnoses:  Acute urinary retention    New Prescriptions New Prescriptions   No medications on file     Varney Biles, MD 03/22/16 2207

## 2016-03-22 NOTE — ED Triage Notes (Signed)
Patient states that he was here earlier and was diagnosed with UTi and sent home with antibiotic. Patient states that when he is trying to void his also does number 2.

## 2016-03-22 NOTE — ED Triage Notes (Signed)
Patient c/o urinary retention.  Patient states that he had radiation seed implants for prostate cancer on January 13, 2016.  Patient states that has had difficulty urinating since then but, has become worse tonight.  Patient does not remember the last time he voided fully.  Patient states that he is only in pain when he tries to void.  Does not express any pain at this time.

## 2016-03-24 LAB — URINE CULTURE

## 2016-03-25 ENCOUNTER — Telehealth (HOSPITAL_COMMUNITY): Payer: Self-pay

## 2016-03-25 NOTE — Progress Notes (Signed)
ED Antimicrobial Stewardship Positive Culture Follow Up   Tyler Guerra is an 65 y.o. male who presented to [facility] on 03/22/2016 with a chief complaint of  Chief Complaint  Patient presents with  . Urinary Tract Infection    Recent Results (from the past 720 hour(s))  Urine culture     Status: Abnormal   Collection Time: 03/22/16  4:23 AM  Result Value Ref Range Status   Specimen Description URINE, CLEAN CATCH  Final   Special Requests NONE  Final   Culture >=100,000 COLONIES/mL ESCHERICHIA COLI (A)  Final   Report Status 03/24/2016 FINAL  Final   Organism ID, Bacteria ESCHERICHIA COLI (A)  Final      Susceptibility   Escherichia coli - MIC*    AMPICILLIN 8 SENSITIVE Sensitive     CEFAZOLIN <=4 SENSITIVE Sensitive     CEFTRIAXONE <=1 SENSITIVE Sensitive     CIPROFLOXACIN >=4 RESISTANT Resistant     GENTAMICIN <=1 SENSITIVE Sensitive     IMIPENEM <=0.25 SENSITIVE Sensitive     NITROFURANTOIN <=16 SENSITIVE Sensitive     TRIMETH/SULFA >=320 RESISTANT Resistant     AMPICILLIN/SULBACTAM 4 SENSITIVE Sensitive     PIP/TAZO <=4 SENSITIVE Sensitive     Extended ESBL NEGATIVE Sensitive     * >=100,000 COLONIES/mL ESCHERICHIA COLI   [x]  Treated with ciprofloxacin, organism resistant to prescribed antimicrobial  New antibiotic prescription: Macrobid 100mg  PO BID for 7 days. Stop ciprofloxacin.   ED Provider: Gay Filler, PA-C  Wyonia Hough), PharmD  PGY1 Pharmacy Resident Pager: [phone] 03/25/2016 8:52 AM

## 2016-03-25 NOTE — Telephone Encounter (Signed)
Post ED Visit - Positive Culture Follow-up: Successful Patient Follow-Up  Culture assessed and recommendations reviewed by: []  Elenor Quinones, Pharm.D. []  Heide Guile, Pharm.D., BCPS []  Parks Neptune, Pharm.D. []  Alycia Rossetti, Pharm.D., BCPS []  [place], Pharm.D., BCPS, AAHIVP []  Legrand Como, Pharm.D., BCPS, AAHIVP []  Milus Glazier, Pharm.D. []  Stephens November, [place].D. Ezzie Dural, Pharm.D.  Positive urine culture, >/= 100,000 colonies -> E Coli  [x]  Patient discharged without antimicrobial prescription and treatment is now indicated []  Organism is resistant to prescribed ED discharge antimicrobial []  Patient with positive blood cultures  Changes discussed with ED provider: Gay Filler PA New antibiotic prescription "Macrobid 100 mg po BID x 7 days"  Stop Ciprofloxacin Called to   Contacted patient, date 03/25/2016, time 9:48  LVM requesting callback.  Letter sent to [facility] address.   Dortha Kern 03/25/2016, 9:54 AM

## 2016-03-29 ENCOUNTER — Telehealth: Payer: Self-pay | Admitting: Family Medicine

## 2016-03-29 NOTE — Telephone Encounter (Signed)
Please get update on mood and urination.  I saw that he had urology f/u in the meantime.  Thanks.

## 2016-03-30 NOTE — Telephone Encounter (Signed)
Left message on patient's voicemail to return call

## 2016-03-30 NOTE — Telephone Encounter (Signed)
Patient says his mood has improved markedly.  He is to go back to Urology tomorrow for follow up and that is improving some as well.

## 2016-03-30 NOTE — Telephone Encounter (Signed)
Great.  Thanks

## 2016-03-31 ENCOUNTER — Other Ambulatory Visit: Payer: Self-pay | Admitting: Family Medicine

## 2016-04-13 ENCOUNTER — Other Ambulatory Visit: Payer: Self-pay

## 2016-04-13 NOTE — Telephone Encounter (Signed)
pts wife left v/m requesting refill atorvastatin to [facility]. Last printed 90 x 3 on 11/05/14. Last lipid done 03/22/15; last surgical clearance appt 11/25/15. No future appt scheduled.Please advise.

## 2016-04-14 MED ORDER — ATORVASTATIN CALCIUM 40 MG PO TABS: 40.0000 mg | ORAL_TABLET | Freq: Every day | ORAL | 1 refills | Status: DC

## 2016-04-14 NOTE — Telephone Encounter (Signed)
Let message asking pt to call office  °

## 2016-04-14 NOTE — Telephone Encounter (Signed)
Sent.  We can do CPE when possible for patient, he has had a lot going on recently.  Ask him to schedule when convenient in the next few months.  Thanks.

## 2016-04-14 NOTE — Telephone Encounter (Signed)
Please call patient and schedule CPE as instructed by Dr. [name].

## 2016-04-14 NOTE — Telephone Encounter (Signed)
Spoke with pt he didn't have his planner with him.  He will call back to schedule

## 2016-05-10 ENCOUNTER — Other Ambulatory Visit: Payer: Self-pay | Admitting: Family Medicine

## 2016-05-10 DIAGNOSIS — Z8739 Personal history of other diseases of the musculoskeletal system and connective tissue: Secondary | ICD-10-CM

## 2016-05-10 DIAGNOSIS — E785 Hyperlipidemia, unspecified: Secondary | ICD-10-CM

## 2016-05-11 ENCOUNTER — Ambulatory Visit (INDEPENDENT_AMBULATORY_CARE_PROVIDER_SITE_OTHER): Payer: BLUE CROSS/BLUE SHIELD

## 2016-05-11 ENCOUNTER — Other Ambulatory Visit (INDEPENDENT_AMBULATORY_CARE_PROVIDER_SITE_OTHER): Payer: BLUE CROSS/BLUE SHIELD

## 2016-05-11 DIAGNOSIS — Z23 Encounter for immunization: Secondary | ICD-10-CM

## 2016-05-11 DIAGNOSIS — Z8739 Personal history of other diseases of the musculoskeletal system and connective tissue: Secondary | ICD-10-CM

## 2016-05-11 DIAGNOSIS — E785 Hyperlipidemia, unspecified: Secondary | ICD-10-CM

## 2016-05-11 LAB — COMPREHENSIVE METABOLIC PANEL
ALK PHOS: 121 U/L — AB (ref 39–117)
ALT: 18 U/L (ref 0–53)
AST: 15 U/L (ref 0–37)
Albumin: 3.7 g/dL (ref 3.5–5.2)
BUN: 15 mg/dL (ref 6–23)
CO2: 30 meq/L (ref 19–32)
Calcium: 9.5 mg/dL (ref 8.4–10.5)
Chloride: 104 mEq/L (ref 96–112)
Creatinine, Ser: 1.03 mg/dL (ref 0.40–1.50)
GFR: 77.18 mL/min (ref >60.00)
GLUCOSE: 147 mg/dL — AB (ref 70–99)
Potassium: 3.8 mEq/L (ref 3.5–5.1)
Sodium: 140 mEq/L (ref 135–145)
Total Bilirubin: 0.8 mg/dL (ref 0.2–1.2)
Total Protein: 6.9 g/dL (ref 6.0–8.3)

## 2016-05-11 LAB — LIPID PANEL
CHOL/HDL RATIO: 3
Cholesterol: 120 mg/dL (ref 0–200)
HDL: 42.6 mg/dL (ref >39.00)
LDL Cholesterol: 56 mg/dL (ref 0–99)
NonHDL: 77.62
TRIGLYCERIDES: 109 mg/dL (ref 0.0–149.0)
VLDL: 21.8 mg/dL (ref 0.0–40.0)

## 2016-05-11 LAB — URIC ACID: Uric Acid, Serum: 4.5 mg/dL (ref 4.0–7.8)

## 2016-05-15 ENCOUNTER — Encounter: Payer: Self-pay | Admitting: Family Medicine

## 2016-05-15 ENCOUNTER — Ambulatory Visit (INDEPENDENT_AMBULATORY_CARE_PROVIDER_SITE_OTHER): Payer: BLUE CROSS/BLUE SHIELD | Admitting: Family Medicine

## 2016-05-15 VITALS — BP 118/70 | HR 52 | Temp 97.8°F | Ht 69.25 in | Wt 207.5 lb

## 2016-05-15 DIAGNOSIS — Z23 Encounter for immunization: Secondary | ICD-10-CM | POA: Diagnosis not present

## 2016-05-15 DIAGNOSIS — C61 Malignant neoplasm of prostate: Secondary | ICD-10-CM

## 2016-05-15 DIAGNOSIS — Z119 Encounter for screening for infectious and parasitic diseases, unspecified: Secondary | ICD-10-CM

## 2016-05-15 DIAGNOSIS — R739 Hyperglycemia, unspecified: Secondary | ICD-10-CM

## 2016-05-15 DIAGNOSIS — Z0000 Encounter for general adult medical examination without abnormal findings: Secondary | ICD-10-CM

## 2016-05-15 DIAGNOSIS — F411 Generalized anxiety disorder: Secondary | ICD-10-CM

## 2016-05-15 DIAGNOSIS — M109 Gout, unspecified: Secondary | ICD-10-CM

## 2016-05-15 LAB — HEMOGLOBIN A1C
HEMOGLOBIN A1C: 6.3 % — AB (ref <5.7)
MEAN PLASMA GLUCOSE: 134 mg/dL

## 2016-05-15 MED ORDER — TRIAMCINOLONE ACETONIDE 0.1 % EX CREA: 1.0000 "application " | TOPICAL_CREAM | Freq: Every day | CUTANEOUS | 2 refills | Status: DC

## 2016-05-15 NOTE — Patient Instructions (Addendum)
Go to the lab on the way out.  We'll contact you with your lab report. Take care.  Glad to see you.  Call cardiology about a follow up appointment.   Don't change your meds for now.

## 2016-05-15 NOTE — Progress Notes (Signed)
Pre visit review using our clinic review tool, if applicable. No additional management support is needed unless otherwise documented below in the visit note. 

## 2016-05-15 NOTE — Progress Notes (Signed)
CPE- See plan.  Routine anticipatory guidance given to patient.  See health maintenance.  Flu shot 2017 Tetanus 2017 PNA 2014 Shingles shot done earlier this year at the pharmacy, 2017.   Colonoscopy 2009 per [facility].  No blood in stool.   PSA per urology.  Living will d/w pt.  Wife designated if patient were incapacitated.  Diet and exercise. Encouraged.  He is trying to work both into his schedule.   Pt opts in for HCV and HIV screening.  D/w pt re: routine screening.    He had noted some stool changes (changes in consistency) but no blood in stool; ongoing since prev prostate procedure.  At this point, w/o black or bloody stools, wouldn't need repeat colon cancer screening at this point.    Uric acid at goal.  D/w pt.  Complaint.  No ADE on med.    Hyperglycemia.  A1c pending. D/w pt about prev labs, recently with sugar >125 x1.    Prostate cancer and bladder sx per URO.  He can cath if needed but hasn't had to do so recently.  Still with some burning with urination.    Mood is clearly better- on lexapro 5mg .  Not tearful, "not as emotional as I used to get."  No ADE now.  Prn valium, rarely used.    PMH and SH reviewed  Meds, vitals, and allergies reviewed.   ROS: Per HPI.  Unless specifically indicated otherwise in HPI, the patient denies:  General: fever. Eyes: acute vision changes ENT: sore throat Cardiovascular: chest pain Respiratory: SOB GI: vomiting GU: dysuria Musculoskeletal: acute back pain Derm: acute rash Neuro: acute motor dysfunction Psych: worsening mood Endocrine: polydipsia Heme: bleeding Allergy: hayfever  GEN: nad, alert and oriented HEENT: mucous membranes moist NECK: supple w/o LA CV: rrr. PULM: ctab, no inc wob ABD: soft, +bs EXT: no edema SKIN: no acute rash but chronic hand rash noted.

## 2016-05-16 LAB — HEPATITIS C ANTIBODY: HCV Ab: NEGATIVE

## 2016-05-16 LAB — HIV ANTIBODY (ROUTINE TESTING W REFLEX): HIV 1&2 Ab, 4th Generation: NONREACTIVE

## 2016-05-17 DIAGNOSIS — R739 Hyperglycemia, unspecified: Secondary | ICD-10-CM

## 2016-05-17 HISTORY — DX: Hyperglycemia, unspecified: R73.9

## 2016-05-17 NOTE — Assessment & Plan Note (Signed)
Per u rology 

## 2016-05-17 NOTE — Assessment & Plan Note (Signed)
Flu shot 2017 Tetanus 2007 PNA 2014 Shingles shot done earlier this year at the pharmacy, 2017.   Colonoscopy 2009 per [facility].  No blood in stool.   PSA per urology.  Living will d/w pt.  Wife designated if patient were incapacitated.  Diet and exercise. Encouraged.  He is trying to work both into his schedule.   Pt opts in for HCV and HIV screening.  D/w pt re: routine screening.

## 2016-05-17 NOTE — Assessment & Plan Note (Signed)
See notes on follow-up labs. Initial labs discussed with patient.

## 2016-05-17 NOTE — Assessment & Plan Note (Signed)
Mood is clearly better. Not tearful. Continue as is. No change in medications. He agrees.

## 2016-05-17 NOTE — Assessment & Plan Note (Signed)
Uric acid at goal. Labs discussed with patient. No change in medication.

## 2016-05-20 ENCOUNTER — Telehealth (HOSPITAL_BASED_OUTPATIENT_CLINIC_OR_DEPARTMENT_OTHER): Payer: Self-pay | Admitting: Emergency Medicine

## 2016-05-20 NOTE — Telephone Encounter (Signed)
Lost to followup 

## 2016-09-30 ENCOUNTER — Other Ambulatory Visit: Payer: Self-pay | Admitting: Family Medicine

## 2016-12-01 ENCOUNTER — Ambulatory Visit (INDEPENDENT_AMBULATORY_CARE_PROVIDER_SITE_OTHER): Payer: Medicare Other | Admitting: Primary Care

## 2016-12-01 ENCOUNTER — Encounter: Payer: Self-pay | Admitting: Primary Care

## 2016-12-01 VITALS — BP 138/84 | HR 82 | Temp 98.0°F | Ht 69.25 in | Wt 215.8 lb

## 2016-12-01 DIAGNOSIS — R300 Dysuria: Secondary | ICD-10-CM | POA: Diagnosis not present

## 2016-12-01 LAB — POC URINALSYSI DIPSTICK (AUTOMATED)
GLUCOSE UA: NEGATIVE
Ketones, UA: NEGATIVE
LEUKOCYTES UA: NEGATIVE
NITRITE UA: NEGATIVE
Protein, UA: NEGATIVE
Spec Grav, UA: >=1.03 — AB (ref 1.010–1.025)
UROBILINOGEN UA: NEGATIVE U/dL — AB
pH, UA: 5.5 (ref 5.0–8.0)

## 2016-12-01 NOTE — Addendum Note (Signed)
Addended by: Jacqualin Combes on: 12/01/2016 12:57 PM   Modules accepted: Orders

## 2016-12-01 NOTE — Patient Instructions (Signed)
Your urine does not show evidence of infection, but I will send your urine off for culture to ensure this is the case.  Please call your Urologist as discussed.  It was a pleasure meeting you!

## 2016-12-01 NOTE — Progress Notes (Signed)
Pre visit review using our clinic review tool, if applicable. No additional management support is needed unless otherwise documented below in the visit note. 

## 2016-12-01 NOTE — Progress Notes (Signed)
Subjective:    Patient ID: Tyler Guerra, male    DOB: Mar 23, 1951, 66 y.o.   MRN: [id number]  HPI  Tyler Guerra is a year old male with a history of prostate cancer, kidney stones, gout, and hyperglycemia who presents today with a chief complaint of dysuria. His dysuria began 8 months ago after receiving his initial treatment for prostate cancer. He also reports intermittent hematuria. He denies frequency, fevers, abdominal pain, flank pain.   He underwent seeding treatment for prostate cancer 8 months ago. Just after his surgery he noted dysuria which was thought to be secondary to the radiation. He was re-evaluated in March 2018 at his Urologists office and mentioned the dysuria. They completed a UA with Culture and was prescribed a three week course of Augmentin. He's since completed the course of antibiotics and noticed a slight improvement in his dysuria, but no resolve.    Review of Systems  Constitutional: Negative for fever.  Gastrointestinal: Negative for abdominal pain.  Genitourinary: Positive for dysuria and hematuria. Negative for difficulty urinating, flank pain and frequency.       Past Medical History:  Diagnosis Date  . Benign localized prostatic hyperplasia with lower urinary tract symptoms (LUTS)   . Carotid artery stenosis    PER DUPLEX 10-22-2015  BILATERAL ICA 1-39%  . Coronary artery disease    CARDIOLOGIST-  DR Daneen Schick--  VISIT EVERY OTHER YEAR-- [place] 2015  . GERD (gastroesophageal reflux disease)   . Gout   . H/O eye injury    chronic changes to left eye after injury  . History of TIA (transient ischemic attack)    03/ 2017  no residual after brief episode loss peripheral vision  . Hyperlipemia   . Hypertension   . Prostate cancer ([place]) UROLOGIST-  DR GRAPEY/  ONCOLOGIST-  DR MANNING   dx 2015--- Stage T1c, Gleason 3+4, PSA 4.03, vol 44cc  . PVC's (premature ventricular contractions)   . 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 Wears glasses      Social History     Social History  . Marital status: Married    Spouse name: N/A  . Number of children: 2  . Years of education: N/A   Occupational History  . SALES REP [employer]    [place], travelling 3-4 nights a week   Social History Main Topics  . Smoking status: Never Smoker  . Smokeless tobacco: Never Used  . Alcohol use No  . Drug use: No  . Sexual activity: Yes   Other Topics Concern  . Not on file   Social History Narrative   Married 1974   2 daughters and 2 grandkids    Past Surgical History:  Procedure Laterality Date  . APPENDECTOMY  1975  . CARDIAC CATHETERIZATION  12-22-2001   dr Daneen Schick   severe native vessel disease mLAD 60-70%,  total occulsion pCFX  and pRCA/  widely patent saphenous vein , free radial , and LIMA grafts/  minminal lv dysfunction, ef 60%  . CORONARY ARTERY BYPASS GRAFT  May 2000   LIMA to LAD,  SVG to PDA and Diagonal, Free radial graft to OM  . Exericse treadmill test  last one 01-03-2014  dr Daneen Schick   normal exercise tolerance w/ hypertensive repsonse,  no ischemic EKG changes, appropriate HR response & recovery (Duke TM score 9;  Low Risk , PVC's w/ exertion)  . PROSTATE BIOPSY    . RADIOACTIVE  SEED IMPLANT N/A 01/13/2016   Procedure: RADIOACTIVE SEED IMPLANT/BRACHYTHERAPY IMPLANT;  Surgeon: Rana Snare, MD;  Location: [facility];  Service: Urology;  Laterality: N/A;    Family History  Problem Relation Age of Onset  . Cancer Father     prostate in eighties  . Prostate cancer Father   . Heart disease Father   . Stroke Father   . Dementia Mother   . Cancer Sister     breast  . Cancer Paternal Uncle     prostate  . Colon cancer Neg Hx     No Known Allergies  Current Outpatient Prescriptions on File Prior to Visit  Medication Sig Dispense Refill  . allopurinol (ZYLOPRIM) 300 MG tablet TAKE ONE TABLET BY MOUTH ONCE DAILY 90 tablet 3  . aspirin 81 MG tablet Take 81 mg by mouth daily.    [name]  atorvastatin (LIPITOR) 40 MG tablet TAKE 1 TABLET (40 MG TOTAL) BY MOUTH DAILY. 180 tablet 0  . escitalopram (LEXAPRO) 10 MG tablet Take 1 tablet (10 mg total) by mouth daily. (Patient taking differently: Take 5 mg by mouth daily. ) 90 tablet 1  . esomeprazole (NEXIUM) 20 MG capsule Take 20 mg by mouth daily at 12 noon.    . hydrochlorothiazide (HYDRODIURIL) 25 MG tablet TAKE ONE TABLET BY MOUTH ONCE DAILY 90 tablet 3  . sodium chloride (OCEAN) 0.65 % SOLN nasal spray Place 1 spray into both nostrils as needed for congestion.    . tamsulosin (FLOMAX) 0.4 MG CAPS capsule Take 0.4 mg by mouth 2 (two) times daily.     [name] triamcinolone cream (KENALOG) 0.1 % Apply 1 application topically at bedtime. Use without a glove initially.  If not responding then use with a glove. 80 g 2  . diazepam (VALIUM) 5 MG tablet Take 0.5-1 tablets (2.5-5 mg total) by mouth 2 (two) times daily as needed for anxiety. (Patient not taking: Reported on 12/01/2016) 10 tablet 0  . polyethylene glycol powder (GLYCOLAX/MIRALAX) powder Take 17 g by mouth daily as needed. (Patient not taking: Reported on 12/01/2016)     No current facility-administered medications on file prior to visit.     BP 138/84   Pulse 82   Temp 98 F (36.7 C) (Oral)   Ht 5' 9.25" (1.759 m)   Wt 215 lb 12.8 oz (97.9 kg)   SpO2 97%   BMI 31.64 kg/m    Objective:   Physical Exam  Constitutional: He appears well-nourished. He does not appear ill. No distress.  Neck: Neck supple.  Cardiovascular: Normal rate and regular rhythm.   Pulmonary/Chest: Effort normal and breath sounds normal.  Abdominal: There is no CVA tenderness.  Skin: Skin is warm and dry.          Assessment & Plan:  Chronic Dysuria:  Present for the past 8 months since initial radiation treatment for prostate cancer. Intermittent hematuria, no other symptoms. Exam today not suspicious for GI or renal stone involvement. UA: 3+ blood, negative leuks, negative nitrites. Culture  sent and pending. Will have him get in touch with his Urologist for further evaluation as his dysuria doesn't seem to be related to an acute cause.   Sheral Flow, NP

## 2016-12-02 LAB — URINE CULTURE: Organism ID, Bacteria: NO GROWTH

## 2016-12-03 ENCOUNTER — Encounter: Payer: Self-pay | Admitting: Family Medicine

## 2017-01-04 DIAGNOSIS — R194 Change in bowel habit: Secondary | ICD-10-CM | POA: Diagnosis not present

## 2017-01-19 DIAGNOSIS — K5730 Diverticulosis of large intestine without perforation or abscess without bleeding: Secondary | ICD-10-CM | POA: Diagnosis not present

## 2017-01-19 DIAGNOSIS — K6289 Other specified diseases of anus and rectum: Secondary | ICD-10-CM | POA: Diagnosis not present

## 2017-01-19 DIAGNOSIS — R194 Change in bowel habit: Secondary | ICD-10-CM | POA: Diagnosis not present

## 2017-01-22 ENCOUNTER — Ambulatory Visit (INDEPENDENT_AMBULATORY_CARE_PROVIDER_SITE_OTHER): Payer: Medicare Other | Admitting: Family Medicine

## 2017-01-22 ENCOUNTER — Encounter: Payer: Self-pay | Admitting: Family Medicine

## 2017-01-22 VITALS — BP 110/64 | HR 80 | Temp 98.6°F | Wt 212.0 lb

## 2017-01-22 DIAGNOSIS — S80862A Insect bite (nonvenomous), left lower leg, initial encounter: Secondary | ICD-10-CM | POA: Diagnosis not present

## 2017-01-22 DIAGNOSIS — W57XXXA Bitten or stung by nonvenomous insect and other nonvenomous arthropods, initial encounter: Secondary | ICD-10-CM

## 2017-01-22 DIAGNOSIS — F411 Generalized anxiety disorder: Secondary | ICD-10-CM

## 2017-01-22 MED ORDER — ESCITALOPRAM OXALATE 10 MG PO TABS: 5.0000 mg | ORAL_TABLET | Freq: Every day | ORAL | Status: DC

## 2017-01-22 NOTE — Progress Notes (Signed)
He had colonoscopy done this week, has 10 year f/u.  D/w pt.    Anxiety is controlled on 5mg  lexapro.  Able to tolerate that dose.  D/w pt.  He is more functional on that dose compared to 10 vs not on med at all.   Noted tick 3 days ago, he was able to pull it off.  L medial shin.  Not engorged.  He removed the tick completely, brought it in.  No FCNAVD.  No other rash other than local irritation.    He feels well o/w.  No FCANVD.    Meds, vitals, and allergies reviewed.   ROS: Per HPI unless specifically indicated in ROS section   nad ncat rrr ctab abd soft, normal BS L medial shin with 2x2cm irritation vs more likely to be a superficial bruise but not fluctuant.  He had squeezed the area and likely bruised it.

## 2017-01-22 NOTE — Patient Instructions (Signed)
If you have fevers or other rashes in the meantime then let me know.  The tick bite site may stay irritated for a week or two.  Keep taking lexapro 5mg  a day.  Take care.  Glad to see you.  Update me as needed.

## 2017-01-24 DIAGNOSIS — W57XXXA Bitten or stung by nonvenomous insect and other nonvenomous arthropods, initial encounter: Secondary | ICD-10-CM | POA: Insufficient documentation

## 2017-01-24 NOTE — Assessment & Plan Note (Signed)
He looks to have local bruising where he removed the tick, not a true rash locally. No other rash. No systemic illness or symptoms. Take examine. Appears to be intact, meaning there should not be any retained parts. Update me as needed. Routine cautions given. No specific intervention needed at this point otherwise. He agrees.

## 2017-01-24 NOTE — Assessment & Plan Note (Signed)
Improved on Lexapro. He was more drowsy on the 10 mg dose. 5 mg appears to be the correct dose for him. Continue as is. He agrees. Okay for outpatient follow-up.

## 2017-01-26 ENCOUNTER — Emergency Department (HOSPITAL_BASED_OUTPATIENT_CLINIC_OR_DEPARTMENT_OTHER): Payer: Medicare Other

## 2017-01-26 ENCOUNTER — Observation Stay (HOSPITAL_BASED_OUTPATIENT_CLINIC_OR_DEPARTMENT_OTHER)
Admission: EM | Admit: 2017-01-26 | Discharge: 2017-01-28 | Disposition: A | Discharge status: Home / Self Care | Payer: Medicare Other | Attending: Internal Medicine | Admitting: Internal Medicine

## 2017-01-26 ENCOUNTER — Encounter (HOSPITAL_BASED_OUTPATIENT_CLINIC_OR_DEPARTMENT_OTHER): Payer: Self-pay | Admitting: *Deleted

## 2017-01-26 DIAGNOSIS — W57XXXD Bitten or stung by nonvenomous insect and other nonvenomous arthropods, subsequent encounter: Secondary | ICD-10-CM | POA: Insufficient documentation

## 2017-01-26 DIAGNOSIS — Z7982 Long term (current) use of aspirin: Secondary | ICD-10-CM | POA: Insufficient documentation

## 2017-01-26 DIAGNOSIS — Z79899 Other long term (current) drug therapy: Secondary | ICD-10-CM | POA: Diagnosis not present

## 2017-01-26 DIAGNOSIS — H53461 Homonymous bilateral field defects, right side: Secondary | ICD-10-CM | POA: Insufficient documentation

## 2017-01-26 DIAGNOSIS — R9082 White matter disease, unspecified: Secondary | ICD-10-CM | POA: Diagnosis not present

## 2017-01-26 DIAGNOSIS — E876 Hypokalemia: Secondary | ICD-10-CM

## 2017-01-26 DIAGNOSIS — N400 Enlarged prostate without lower urinary tract symptoms: Secondary | ICD-10-CM | POA: Diagnosis not present

## 2017-01-26 DIAGNOSIS — F32A Depression, unspecified: Secondary | ICD-10-CM

## 2017-01-26 DIAGNOSIS — Z951 Presence of aortocoronary bypass graft: Secondary | ICD-10-CM | POA: Diagnosis not present

## 2017-01-26 DIAGNOSIS — W57XXXA Bitten or stung by nonvenomous insect and other nonvenomous arthropods, initial encounter: Secondary | ICD-10-CM | POA: Diagnosis present

## 2017-01-26 DIAGNOSIS — R4182 Altered mental status, unspecified: Secondary | ICD-10-CM | POA: Insufficient documentation

## 2017-01-26 DIAGNOSIS — I10 Essential (primary) hypertension: Secondary | ICD-10-CM | POA: Diagnosis not present

## 2017-01-26 DIAGNOSIS — E785 Hyperlipidemia, unspecified: Secondary | ICD-10-CM | POA: Insufficient documentation

## 2017-01-26 DIAGNOSIS — I2510 Atherosclerotic heart disease of native coronary artery without angina pectoris: Secondary | ICD-10-CM | POA: Insufficient documentation

## 2017-01-26 DIAGNOSIS — R4701 Aphasia: Principal | ICD-10-CM | POA: Insufficient documentation

## 2017-01-26 DIAGNOSIS — S90569D Insect bite (nonvenomous), unspecified ankle, subsequent encounter: Secondary | ICD-10-CM | POA: Diagnosis not present

## 2017-01-26 DIAGNOSIS — Z5181 Encounter for therapeutic drug level monitoring: Secondary | ICD-10-CM | POA: Diagnosis not present

## 2017-01-26 DIAGNOSIS — Z8546 Personal history of malignant neoplasm of prostate: Secondary | ICD-10-CM | POA: Diagnosis not present

## 2017-01-26 DIAGNOSIS — K219 Gastro-esophageal reflux disease without esophagitis: Secondary | ICD-10-CM | POA: Diagnosis not present

## 2017-01-26 DIAGNOSIS — F329 Major depressive disorder, single episode, unspecified: Secondary | ICD-10-CM | POA: Insufficient documentation

## 2017-01-26 DIAGNOSIS — F419 Anxiety disorder, unspecified: Secondary | ICD-10-CM | POA: Diagnosis not present

## 2017-01-26 DIAGNOSIS — Z8673 Personal history of transient ischemic attack (TIA), and cerebral infarction without residual deficits: Secondary | ICD-10-CM | POA: Diagnosis not present

## 2017-01-26 DIAGNOSIS — G9341 Metabolic encephalopathy: Secondary | ICD-10-CM | POA: Diagnosis present

## 2017-01-26 DIAGNOSIS — M109 Gout, unspecified: Secondary | ICD-10-CM | POA: Diagnosis not present

## 2017-01-26 HISTORY — DX: Aphasia: R47.01

## 2017-01-26 HISTORY — DX: Hypokalemia: E87.6

## 2017-01-26 HISTORY — DX: Depression, unspecified: F32.A

## 2017-01-26 LAB — COMPREHENSIVE METABOLIC PANEL
ALBUMIN: 4 g/dL (ref 3.5–5.0)
ALT: 20 U/L (ref 17–63)
AST: 22 U/L (ref 15–41)
Alkaline Phosphatase: 99 U/L (ref 38–126)
Anion gap: 9 (ref 5–15)
BUN: 16 mg/dL (ref 6–20)
CHLORIDE: 102 mmol/L (ref 101–111)
CO2: 25 mmol/L (ref 22–32)
CREATININE: 1.16 mg/dL (ref 0.61–1.24)
Calcium: 9.2 mg/dL (ref 8.9–10.3)
GFR calc non Af Amer: >60 mL/min (ref >60)
Glucose, Bld: 122 mg/dL — ABNORMAL HIGH (ref 65–99)
Potassium: 3.3 mmol/L — ABNORMAL LOW (ref 3.5–5.1)
SODIUM: 136 mmol/L (ref 135–145)
Total Bilirubin: 1.2 mg/dL (ref 0.3–1.2)
Total Protein: 6.8 g/dL (ref 6.5–8.1)

## 2017-01-26 LAB — DIFFERENTIAL
BASOS ABS: 0 10*3/uL (ref 0.0–0.1)
BASOS PCT: 0 %
Eosinophils Absolute: 0.1 10*3/uL (ref 0.0–0.7)
Eosinophils Relative: 1 %
Lymphocytes Relative: 21 %
Lymphs Abs: 1.6 10*3/uL (ref 0.7–4.0)
Monocytes Absolute: 0.5 10*3/uL (ref 0.1–1.0)
Monocytes Relative: 7 %
NEUTROS ABS: 5.3 10*3/uL (ref 1.7–7.7)
NEUTROS PCT: 71 %

## 2017-01-26 LAB — TROPONIN I: Troponin I: <0.03 ng/mL (ref <0.03)

## 2017-01-26 LAB — CBC
HCT: 40.4 % (ref 39.0–52.0)
Hemoglobin: 14 g/dL (ref 13.0–17.0)
MCH: 30.8 pg (ref 26.0–34.0)
MCHC: 34.7 g/dL (ref 30.0–36.0)
MCV: 88.8 fL (ref 78.0–100.0)
PLATELETS: 182 10*3/uL (ref 150–400)
RBC: 4.55 MIL/uL (ref 4.22–5.81)
RDW: 13 % (ref 11.5–15.5)
WBC: 7.5 10*3/uL (ref 4.0–10.5)

## 2017-01-26 LAB — URINALYSIS, ROUTINE W REFLEX MICROSCOPIC
Bilirubin Urine: NEGATIVE
GLUCOSE, UA: NEGATIVE mg/dL
Hgb urine dipstick: NEGATIVE
KETONES UR: NEGATIVE mg/dL
LEUKOCYTES UA: NEGATIVE
Nitrite: NEGATIVE
PROTEIN: NEGATIVE mg/dL
Specific Gravity, Urine: 1.012 (ref 1.005–1.030)
pH: 6 (ref 5.0–8.0)

## 2017-01-26 LAB — RAPID URINE DRUG SCREEN, HOSP PERFORMED
Amphetamines: NOT DETECTED
BARBITURATES: NOT DETECTED
Benzodiazepines: NOT DETECTED
COCAINE: NOT DETECTED
Opiates: NOT DETECTED
TETRAHYDROCANNABINOL: NOT DETECTED

## 2017-01-26 LAB — ETHANOL

## 2017-01-26 LAB — PROTIME-INR
INR: 1.09
PROTHROMBIN TIME: 14.1 s (ref 11.4–15.2)

## 2017-01-26 LAB — APTT: APTT: 37 s — AB (ref 24–36)

## 2017-01-26 MED ORDER — ACETAMINOPHEN 325 MG PO TABS
650.0000 mg | ORAL_TABLET | Freq: Once | ORAL | Status: AC
  Administered 2017-01-26: 650 mg via ORAL
  Filled 2017-01-26: qty 2

## 2017-01-26 MED ORDER — ACETAMINOPHEN 325 MG PO TABS
ORAL_TABLET | ORAL | Status: AC
  Filled 2017-01-26: qty 1

## 2017-01-26 MED ORDER — METOCLOPRAMIDE HCL 5 MG/ML IJ SOLN
10.0000 mg | Freq: Once | INTRAMUSCULAR | Status: AC
  Administered 2017-01-26: 10 mg via INTRAVENOUS
  Filled 2017-01-26: qty 2

## 2017-01-26 NOTE — ED Notes (Signed)
Patient transported to CT 

## 2017-01-26 NOTE — ED Notes (Signed)
Pt was working in the yard today and reports having a hard time getting words out and comprehending what he was doing. Daughter reports that on arrival to the ED pt did not recognize her. On assessment, pt reported it was taking him longer to understand what I was saying.

## 2017-01-26 NOTE — ED Notes (Signed)
ED Provider at bedside. 

## 2017-01-26 NOTE — ED Provider Notes (Signed)
[facility] [facility] DEPT [facility] Provider Note   CSN: [id number] Arrival date & time: 01/26/17  2043  By signing my name below, I, Levester Fresh, attest that this documentation has been prepared under the direction and in the presence of Quintella Reichert, MD . Electronically Signed: Levester Fresh, Scribe. 01/26/2017. 10:45 PM.  History   Chief Complaint Chief Complaint  Patient presents with  . Altered Mental Status    HPI Comments Tyler Guerra. is a 66 y.o. male with a PMHx significant for HLD, gout, HTN, GERD, prostate cancer, CAD s/p CABG, vertigo, TIA and anxiety, who presents to the Emergency Department with complaints of AMS x4 hrs.  Pt with aphasia and confusion.  He is having a hard time recalling words.  Per daughter at bedside, pt's last known normal was at 1350.  He feels as if his sx began around 1600, but cannot be completely sure. He denies any pain at this time; no headache, abdominal pain, lower extremity pain or chest pain.  No dyspnea, fevers, chills.  No numbness or weakness.  Pt is on ASA.  He is a non-smoker and denies alcohol or illicit drug use.   The history is provided by the patient and medical records. No language interpreter was used.    Past Medical History:  Diagnosis Date  . Benign localized prostatic hyperplasia with lower urinary tract symptoms (LUTS)   . Carotid artery stenosis    PER DUPLEX 10-22-2015  BILATERAL ICA 1-39%  . Coronary artery disease    CARDIOLOGIST-  DR Daneen Schick--  VISIT EVERY OTHER YEAR-- [place] 2015  . GERD (gastroesophageal reflux disease)   . Gout   . H/O eye injury    chronic changes to left eye after injury  . History of TIA (transient ischemic attack)    03/ 2017  no residual after brief episode loss peripheral vision  . Hyperlipemia   . Hypertension   . Prostate cancer ([place]) UROLOGIST-  DR GRAPEY/  ONCOLOGIST-  DR MANNING   dx 2015--- Stage T1c, Gleason 3+4, PSA 4.03, vol 44cc  . PVC's (premature ventricular  contractions)   . 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 Wears glasses     Patient Active Problem List   Diagnosis Date Noted  . Depression 01/26/2017  . Difficulty speaking 01/26/2017  . Tick bite 01/24/2017  . Hyperglycemia 05/17/2016  . Anxiety state 03/16/2016  . TIA (transient ischemic attack) 10/14/2015  . Rash and nonspecific skin eruption 10/14/2015  . Skin lesion 10/14/2015  . BPV (benign positional vertigo) 11/02/2014  . Advance care planning 05/13/2014  . 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 Hearing loss 04/21/2013  . Ventral hernia 10/26/2011  . Routine general medical examination at a health care facility 10/26/2011  . Prostate cancer ([place]) 10/26/2011  . Coronary atherosclerosis 07/19/2010  . GERD 07/19/2010  . Hyperlipidemia 07/18/2010  . Gout 07/18/2010  . HYPERTENSION, BENIGN ESSENTIAL 07/18/2010    Past Surgical History:  Procedure Laterality Date  . APPENDECTOMY  1975  . CARDIAC CATHETERIZATION  12-22-2001   dr Daneen Schick   severe native vessel disease mLAD 60-70%,  total occulsion pCFX  and pRCA/  widely patent saphenous vein , free radial , and LIMA grafts/  minminal lv dysfunction, ef 60%  . CORONARY ARTERY BYPASS GRAFT  May 2000   LIMA to LAD,  SVG to PDA and Diagonal, Free radial graft to OM  . Exericse treadmill test  last  one 01-03-2014  dr Daneen Schick   normal exercise tolerance w/ hypertensive repsonse,  no ischemic EKG changes, appropriate HR response & recovery (Duke TM score 9;  Low Risk , PVC's w/ exertion)  . PROSTATE BIOPSY    . RADIOACTIVE SEED IMPLANT N/A 01/13/2016   Procedure: RADIOACTIVE SEED IMPLANT/BRACHYTHERAPY IMPLANT;  Surgeon: Rana Snare, MD;  Location: [facility];  Service: Urology;  Laterality: N/A;       Home Medications    Prior to Admission medications   Medication Sig Start Date End Date Taking? Authorizing Provider  allopurinol (ZYLOPRIM) 300 MG tablet TAKE ONE TABLET BY MOUTH ONCE DAILY  03/31/16   Tonia Ghent, MD  aspirin 81 MG tablet Take 81 mg by mouth daily.    [provider]  atorvastatin (LIPITOR) 40 MG tablet TAKE 1 TABLET (40 MG TOTAL) BY MOUTH DAILY. 09/30/16   Tonia Ghent, MD  diazepam (VALIUM) 5 MG tablet Take 0.5-1 tablets (2.5-5 mg total) by mouth 2 (two) times daily as needed for anxiety. 03/16/16   Tonia Ghent, MD  escitalopram (LEXAPRO) 10 MG tablet Take 0.5 tablets (5 mg total) by mouth daily. 01/22/17   Tonia Ghent, MD  esomeprazole (NEXIUM) 20 MG capsule Take 20 mg by mouth daily at 12 noon.    [provider]  hydrochlorothiazide (HYDRODIURIL) 25 MG tablet TAKE ONE TABLET BY MOUTH ONCE DAILY 03/31/16   Tonia Ghent, MD  polyethylene glycol powder (GLYCOLAX/MIRALAX) powder Take 17 g by mouth daily as needed. 03/16/16   Tonia Ghent, MD  sodium chloride (OCEAN) 0.65 % SOLN nasal spray Place 1 spray into both nostrils as needed for congestion.    [provider]  tamsulosin (FLOMAX) 0.4 MG CAPS capsule Take 0.4 mg by mouth 2 (two) times daily.     [provider]  triamcinolone cream (KENALOG) 0.1 % Apply 1 application topically at bedtime. Use without a glove initially.  If not responding then use with a glove. 05/15/16   Tonia Ghent, MD    Family History Family History  Problem Relation Age of Onset  . Cancer Father        prostate in eighties  . Prostate cancer Father   . Heart disease Father   . Stroke Father   . Dementia Mother   . Cancer Sister        breast  . Cancer Paternal Uncle        prostate  . Colon cancer Neg Hx     Social History Social History  Substance Use Topics  . Smoking status: Never Smoker  . Smokeless tobacco: Never Used  . Alcohol use No     Allergies   Patient has no known allergies.   Review of Systems Review of Systems  Constitutional: Negative for chills and fever.  Respiratory: Negative for shortness of breath.   Cardiovascular: Negative for  leg swelling.  Gastrointestinal: Negative for abdominal pain, nausea and vomiting.  Musculoskeletal: Negative for arthralgias and myalgias.  Neurological: Negative for weakness, numbness and headaches.  Psychiatric/Behavioral: Positive for confusion.  All other systems reviewed and are negative.  Physical Exam Updated Vital Signs BP (!) 152/89   Pulse 64   Temp 98.6 F (37 C)   Resp 14   Ht 5\' 10"  (1.778 m)   Wt 96.2 kg (212 lb)   SpO2 99%   BMI 30.42 kg/m   Physical Exam  Constitutional: He is oriented to person, place, and  time. He appears well-developed and well-nourished.  HENT:  Head: Normocephalic and atraumatic.  Cardiovascular: Normal rate and regular rhythm.   No murmur heard. Pulmonary/Chest: Effort normal and breath sounds normal. No respiratory distress.  Abdominal: Soft. There is no tenderness. There is no rebound and no guarding.  Musculoskeletal: He exhibits no edema or tenderness.  Neurological: He is alert and oriented to person, place, and time.  Mild to moderate expressive aphasia. No facial asymmetry. 5 out of 5 strength in all 4 extremities with sensation to light touch intact in all 4 extremities. Visual fields grossly in tact.  Skin: Skin is warm and dry.  Psychiatric: He has a normal mood and affect. His behavior is normal.  Nursing note and vitals reviewed.  ED Treatments / Results  DIAGNOSTIC STUDIES: Oxygen Saturation is 100% on room air, normal by my interpretation.    COORDINATION OF CARE: 9:18 PM Discussed treatment plan with pt and daughters at bedside.  They agreed to plan.  Labs (all labs ordered are listed, but only abnormal results are displayed) Labs Reviewed  APTT - Abnormal; Notable for the following:       Result Value   aPTT 37 (*)    All other components within normal limits  COMPREHENSIVE METABOLIC PANEL - Abnormal; Notable for the following:    Potassium 3.3 (*)    Glucose, Bld 122 (*)    All other components within normal  limits  ETHANOL  PROTIME-INR  CBC  DIFFERENTIAL  RAPID URINE DRUG SCREEN, HOSP PERFORMED  URINALYSIS, ROUTINE W REFLEX MICROSCOPIC  TROPONIN I    EKG  EKG Interpretation  Date/Time:  Tuesday January 26 2017 20:56:20 EDT Ventricular Rate:  70 PR Interval:  172 QRS Duration: 96 QT Interval:  422 QTC Calculation: 455 R Axis:   48 Text Interpretation:  Normal sinus rhythm Normal ECG Confirmed by Hazle Coca [phone]) on 01/26/2017 9:01:51 PM       Radiology Ct Head Wo Contrast  Result Date: 01/26/2017 CLINICAL DATA:  66 year old male with altered mental status and confusion for 1 day. EXAM: CT HEAD WITHOUT CONTRAST TECHNIQUE: Contiguous axial images were obtained from the base of the skull through the vertex without intravenous contrast. COMPARISON:  10/21/2015 head CT FINDINGS: Brain: No evidence of acute infarction, hemorrhage, hydrocephalus, extra-axial collection or mass lesion/mass effect. Chronic small-vessel white matter ischemic changes again noted. Vascular: Intracranial atherosclerotic calcifications noted. Skull: Normal. Negative for fracture or focal lesion. Sinuses/Orbits: No acute finding. Other: None. IMPRESSION: No evidence of acute intracranial abnormality. Chronic small-vessel white matter ischemic changes. Electronically Signed   By: Margarette Canada M.D.   On: 01/26/2017 21:40    Procedures Procedures (including critical care time)  Medications Ordered in ED Medications  acetaminophen (TYLENOL) tablet 650 mg (650 mg Oral Given 01/26/17 2221)     Initial Impression / Assessment and Plan / ED Course  I have reviewed the triage vital signs and the nursing notes.  Pertinent labs & imaging results that were available during my care of the patient were reviewed by me and considered in my medical decision making (see chart for details).    Patient here for evaluation of speech difficulties that started around 150 this afternoon. He has mild to moderate expressive aphasia on  examination with no additional deficits. Concern for potential CVA but he is not a TPA candidate given duration of symptoms. Discussed with Dr. Cheral Marker with neurology who recommends transfer to [facility] and admission to the hospitalist service. He requests  that he is contacted when the patient arrives at [facility]. Discussed with Dr. Blaine Hamper with the hospitalist service who agrees to admit the patient. Patient updated of findings to studies recommendation for admission and he is in agreement with plan.  On repeat assessment during ED stay patient developed a left periorbital headache. Will treat with Tylenol.   I personally performed the services described in this documentation, which was scribed in my presence. The recorded information has been reviewed and is accurate.   Final Clinical Impressions(s) / ED Diagnoses   Final diagnoses:  None    New Prescriptions New Prescriptions   No medications on file     Quintella Reichert, MD 01/26/17 2307

## 2017-01-26 NOTE — ED Triage Notes (Signed)
Pt c/o confusion, last seen normal x 1 day go

## 2017-01-26 NOTE — Progress Notes (Signed)
This is a no charge note  Transfer from [facility] per Dr. Ralene Bathe  66 year old man with asked medical history of HLD, gout, HTN, GERD, prostate cancer (s/p of radiation seeds implant), CAD s/p CABG, vertigo, TIA and anxiety, depression, carotid artery stenosis, who presents with altered mental status and difficulty speaking. pt's last known normal was at 1350. CT head is negative for acute intracranial abnormalities.  Patient was found to have WBC 7.5, INR 1.09, PTT 37, negative troponin, negative UDS, negative urinalysis, alcohol level less than 5, potassium 3.3, creatinine 1.16, temperature normal, no tachycardia, oxygen saturation 100% on room air. Patient is accepted to telemetry bed for observation. Neurology, Dr. Cheral Marker was consulted by EDP. Please inform Dr. Cheral Marker at the patient arrival to the floor.   Please call manager or [facility] at [phone] when pt arrives to floor   Ivor Costa, MD  [facility] Pager [phone]  If 7PM-7AM, please contact night-coverage [email or web address] Password [facility] 01/26/2017, 11:20 PM

## 2017-01-27 ENCOUNTER — Observation Stay (HOSPITAL_BASED_OUTPATIENT_CLINIC_OR_DEPARTMENT_OTHER): Payer: Medicare Other

## 2017-01-27 ENCOUNTER — Encounter (HOSPITAL_COMMUNITY): Payer: Self-pay

## 2017-01-27 ENCOUNTER — Observation Stay (HOSPITAL_COMMUNITY): Payer: Medicare Other

## 2017-01-27 ENCOUNTER — Other Ambulatory Visit (HOSPITAL_COMMUNITY): Payer: Medicare Other

## 2017-01-27 DIAGNOSIS — I10 Essential (primary) hypertension: Secondary | ICD-10-CM

## 2017-01-27 DIAGNOSIS — R4701 Aphasia: Secondary | ICD-10-CM

## 2017-01-27 DIAGNOSIS — W57XXXD Bitten or stung by nonvenomous insect and other nonvenomous arthropods, subsequent encounter: Secondary | ICD-10-CM

## 2017-01-27 DIAGNOSIS — R410 Disorientation, unspecified: Secondary | ICD-10-CM | POA: Diagnosis not present

## 2017-01-27 DIAGNOSIS — E876 Hypokalemia: Secondary | ICD-10-CM | POA: Diagnosis not present

## 2017-01-27 DIAGNOSIS — Z951 Presence of aortocoronary bypass graft: Secondary | ICD-10-CM | POA: Diagnosis not present

## 2017-01-27 LAB — LIPID PANEL
CHOL/HDL RATIO: 2.9 ratio
CHOLESTEROL: 129 mg/dL (ref 0–200)
HDL: 44 mg/dL (ref >40)
LDL CALC: 71 mg/dL (ref 0–99)
TRIGLYCERIDES: 70 mg/dL (ref <150)
VLDL: 14 mg/dL (ref 0–40)

## 2017-01-27 LAB — VAS US CAROTID
LCCAPDIAS: 24 cm/s
LCCAPSYS: 105 cm/s
LEFT ECA DIAS: -18 cm/s
LEFT VERTEBRAL DIAS: -8 cm/s
LICADDIAS: -26 cm/s
LICAPSYS: -58 cm/s
Left CCA dist dias: -18 cm/s
Left CCA dist sys: -67 cm/s
Left ICA dist sys: -55 cm/s
Left ICA prox dias: -23 cm/s
RCCAPDIAS: 13 cm/s
RIGHT ECA DIAS: -8 cm/s
RIGHT VERTEBRAL DIAS: 20 cm/s
Right CCA prox sys: 74 cm/s
Right cca dist sys: -61 cm/s

## 2017-01-27 MED ORDER — PANTOPRAZOLE SODIUM 40 MG PO TBEC
40.0000 mg | DELAYED_RELEASE_TABLET | Freq: Every day | ORAL | Status: DC
  Administered 2017-01-27 – 2017-01-28 (×2): 40 mg via ORAL
  Filled 2017-01-27 (×2): qty 1

## 2017-01-27 MED ORDER — IPRATROPIUM-ALBUTEROL 0.5-2.5 (3) MG/3ML IN SOLN: 3.0000 mL | Freq: Four times a day (QID) | RESPIRATORY_TRACT | Status: DC | PRN

## 2017-01-27 MED ORDER — ASPIRIN 325 MG PO TABS
325.0000 mg | ORAL_TABLET | Freq: Every day | ORAL | Status: DC
  Administered 2017-01-27 – 2017-01-28 (×2): 325 mg via ORAL
  Filled 2017-01-27 (×2): qty 1

## 2017-01-27 MED ORDER — SENNOSIDES-DOCUSATE SODIUM 8.6-50 MG PO TABS: 1.0000 | ORAL_TABLET | Freq: Every evening | ORAL | Status: DC | PRN

## 2017-01-27 MED ORDER — TAMSULOSIN HCL 0.4 MG PO CAPS
0.4000 mg | ORAL_CAPSULE | Freq: Two times a day (BID) | ORAL | Status: DC
  Administered 2017-01-27 – 2017-01-28 (×3): 0.4 mg via ORAL
  Filled 2017-01-27 (×3): qty 1

## 2017-01-27 MED ORDER — STROKE: EARLY STAGES OF RECOVERY BOOK
Freq: Once | Status: AC
  Administered 2017-01-27: 1
  Filled 2017-01-27: qty 1

## 2017-01-27 MED ORDER — POTASSIUM CHLORIDE CRYS ER 20 MEQ PO TBCR
20.0000 meq | EXTENDED_RELEASE_TABLET | Freq: Once | ORAL | Status: AC
  Administered 2017-01-27: 20 meq via ORAL
  Filled 2017-01-27: qty 1

## 2017-01-27 MED ORDER — ACETAMINOPHEN 160 MG/5ML PO SOLN: 650.0000 mg | ORAL | Status: DC | PRN

## 2017-01-27 MED ORDER — POLYETHYLENE GLYCOL 3350 17 GM/SCOOP PO POWD
17.0000 g | Freq: Every day | ORAL | Status: DC | PRN
  Filled 2017-01-27: qty 255

## 2017-01-27 MED ORDER — ESCITALOPRAM OXALATE 10 MG PO TABS
5.0000 mg | ORAL_TABLET | Freq: Every day | ORAL | Status: DC
  Administered 2017-01-27 – 2017-01-28 (×2): 5 mg via ORAL
  Filled 2017-01-27 (×2): qty 1

## 2017-01-27 MED ORDER — ACETAMINOPHEN 650 MG RE SUPP: 650.0000 mg | RECTAL | Status: DC | PRN

## 2017-01-27 MED ORDER — ENOXAPARIN SODIUM 40 MG/0.4ML ~~LOC~~ SOLN
40.0000 mg | SUBCUTANEOUS | Status: DC
  Administered 2017-01-27: 40 mg via SUBCUTANEOUS
  Filled 2017-01-27 (×2): qty 0.4

## 2017-01-27 MED ORDER — ALLOPURINOL 300 MG PO TABS
300.0000 mg | ORAL_TABLET | Freq: Every day | ORAL | Status: DC
  Administered 2017-01-27 – 2017-01-28 (×2): 300 mg via ORAL
  Filled 2017-01-27 (×2): qty 1

## 2017-01-27 MED ORDER — ASPIRIN 300 MG RE SUPP: 300.0000 mg | Freq: Every day | RECTAL | Status: DC

## 2017-01-27 MED ORDER — POLYETHYLENE GLYCOL 3350 17 G PO PACK: 17.0000 g | PACK | Freq: Every day | ORAL | Status: DC | PRN

## 2017-01-27 MED ORDER — ACETAMINOPHEN 325 MG PO TABS
650.0000 mg | ORAL_TABLET | ORAL | Status: DC | PRN
  Administered 2017-01-27: 650 mg via ORAL
  Filled 2017-01-27: qty 2

## 2017-01-27 MED ORDER — DEXTROSE 5 % IV SOLN
100.0000 mg | Freq: Two times a day (BID) | INTRAVENOUS | Status: DC
  Filled 2017-01-27: qty 100

## 2017-01-27 MED ORDER — ASPIRIN 325 MG PO TABS: 325.0000 mg | ORAL_TABLET | Freq: Every day | ORAL | 0 refills | Status: DC

## 2017-01-27 MED ORDER — DIAZEPAM 5 MG PO TABS: 2.5000 mg | ORAL_TABLET | Freq: Two times a day (BID) | ORAL | Status: DC | PRN

## 2017-01-27 MED ORDER — POLYETHYLENE GLYCOL 3350 17 GM/SCOOP PO POWD: 17.0000 g | Freq: Every day | ORAL | Status: DC | PRN

## 2017-01-27 MED ORDER — HYDROCHLOROTHIAZIDE 25 MG PO TABS: 25.0000 mg | ORAL_TABLET | Freq: Every day | ORAL | Status: DC

## 2017-01-27 MED ORDER — ATORVASTATIN CALCIUM 40 MG PO TABS
40.0000 mg | ORAL_TABLET | Freq: Every day | ORAL | Status: DC
  Administered 2017-01-27: 40 mg via ORAL
  Filled 2017-01-27 (×2): qty 1

## 2017-01-27 NOTE — Progress Notes (Signed)
OT Cancellation Note  Patient Details Name: Tyler Guerra. MRN: [id number] DOB: [birth year]   Cancelled Treatment:    Reason Eval/Treat Not Completed: OT screened, no needs identified, will sign off  Malka So 01/27/2017, 9:46 AM

## 2017-01-27 NOTE — Care Management Obs Status (Signed)
[place] NOTIFICATION   Patient Details  Name: Tyler Guerra. MRN: [id number] Date of Birth: 05-29-1951   Medicare Observation Status Notification Given:  Yes    [name], [place] 01/27/2017, 5:24 PM

## 2017-01-27 NOTE — Progress Notes (Signed)
STROKE TEAM PROGRESS NOTE   HISTORY OF PRESENT ILLNESS (per record) Meet Tyler Guerra. is an 66 y.o. male who presented to the ED on 01/26/2017 with c/c of transient expressive aphasia with confusion and right hemianopsia. His symptoms began at about 4:00 PM 01/26/2017 according to one note in the chart, but patient tells Neurologist that he thinks onset may have been closer to 6 or 7 PM. He first noticed a problem when he went inside to feed his dog after working outside cutting down a tree. He became confused about what to fill the 3 bowls with, with a sensation of spatial disorientation. He looked in the mirror to check for a facial droop and there was none. He did notice partial obscuration of his right visual field. When he tried to speak, he had trouble coming up with the words, as well as pronouncing them. He had a severe left sided headache at the time of the symptoms, which is now resolved.  On arrival to the ED, he still had confusion and aphasia. At time of Neurological evaluation, these symptoms have resolved.   He endorses recent tick bite but has had no rash, stiff neck or fever. His PMHx includes CAD, TIA in March of 2017, anxiety, prostate CA, bilateral carotid artery stenosis, CAD, bilateral eye injuries, HLD, HTN and 4-vessel CABG in 2000.   CT head at [facility] ED was negative for acute changes. He was transferred to [facility] for further evaluation.  Stroke workup pending.  Home medications include ASA and atorvastatin.   Patient was not administered IV t-PA secondary to presenting outside of the treatment window. He was admitted to General Neurology for further evaluation and treatment.   SUBJECTIVE (INTERVAL HISTORY) History is that he had transient right-sided visual disturbance along with some speech difficulties and not feeling well followed by severe headache. He gives history of similar episode of vision disturbance in the past but denies true migraine history. No prior  history of TIA or stroke.   OBJECTIVE Temp:  [98.5 F (36.9 C)-98.7 F (37.1 C)] 98.7 F (37.1 C) (06/27 0600) Pulse Rate:  [58-78] 58 (06/27 0600) Cardiac Rhythm: Normal sinus rhythm (06/27 0323) Resp:  [11-18] 18 (06/27 0600) BP: (130-152)/(79-90) 130/79 (06/27 0600) SpO2:  [97 %-100 %] 99 % (06/27 0600) Weight:  [95.7 kg (211 lb)-96.2 kg (212 lb)] 95.7 kg (211 lb) (06/27 0317)  CBC:  Recent Labs Lab 01/26/17 2145  WBC 7.5  NEUTROABS 5.3  HGB 14.0  HCT 40.4  MCV 88.8  PLT 253    Basic Metabolic Panel:  Recent Labs Lab 01/26/17 2145  NA 136  K 3.3*  CL 102  CO2 25  GLUCOSE 122*  BUN 16  CREATININE 1.16  CALCIUM 9.2    Lipid Panel:    Component Value Date/Time   CHOL 129 01/27/2017 0614   TRIG 70 01/27/2017 0614   HDL 44 01/27/2017 0614   CHOLHDL 2.9 01/27/2017 0614   VLDL 14 01/27/2017 0614   LDLCALC 71 01/27/2017 0614   HgbA1c:  Lab Results  Component Value Date   HGBA1C 6.3 (H) 05/15/2016   Urine Drug Screen:    Component Value Date/Time   LABOPIA NONE DETECTED 01/26/2017 2145   COCAINSCRNUR NONE DETECTED 01/26/2017 2145   LABBENZ NONE DETECTED 01/26/2017 2145   AMPHETMU NONE DETECTED 01/26/2017 2145   THCU NONE DETECTED 01/26/2017 2145   LABBARB NONE DETECTED 01/26/2017 2145    Alcohol Level     Component Value Date/Time  ETH <5 01/26/2017 2145    IMAGING  Ct Head Wo Contrast 01/26/2017 IMPRESSION: No evidence of acute intracranial abnormality. Chronic small-vessel white matter ischemic changes.  MR MRA head 01/27/2017 No acute infarct.  MRA shows no large vessel stenosis.    PHYSICAL EXAM  Pleasant middle-age male currently not in distress. . Afebrile. Head is nontraumatic. Neck is supple without bruit.    Cardiac exam no murmur or gallop. Lungs are clear to auscultation. Distal pulses are well felt. Neurological Exam ;  Awake  Alert oriented x 3. Normal speech and language.eye movements full without nystagmus.fundi were not  visualized. Vision acuity and fields appear normal. Hearing is normal. Palatal movements are normal. Face symmetric. Tongue midline. Normal strength, tone, reflexes and coordination. Normal sensation. Gait deferred.  ASSESSMENT/PLAN Mr. Tyler Guerra. is a 66 y.o. male with history of TIA, carotid artery stenosis, coronary artery disease (s/p CABG x4), prostate cancer, hyperlipidemia, hypertension, PVCs, and eye injury presenting with transient expressive aphasia with confusion and right hemianopsia. He did not receive IV t-PA due to arriving outside of the treatment [place].   Left brain TIA versus complicated migraine episode   Resultant  No deficits  CT head: no stroke  MRI head:  No acute infarct MRA head Motion degraded head MRA. Patent circle of Willis without proximal branch occlusion or significant anterior circulation stenosis. Stenosis versus artifact near the left vertebrobasilar  junction.  Carotid Doppler  No significant stenosis  2D Echo   pending   LDL 71  HgbA1c 6.3  Lovenox 40 mg sq daily for VTE prophylaxis  Diet Heart Room service appropriate? Yes; Fluid consistency: Thin  No antithrombotic prior to admission, now on aspirin 325 mg daily  Patient counseled to be compliant with his antithrombotic medications  Ongoing aggressive stroke risk factor management  Therapy recommendations:   pending  Disposition:  home  Hypertension  Stable  Permissive hypertension (OK if < 220/120) but gradually normalize in 5-7 days  Long-term BP goal normotensive  Hyperlipidemia  Home meds:  Atorvastatin 40mg  PO daily, resumed in hospital  LDL 71, goal < 70  Continue statin at discharge  Prediabetes  HgbA1c 6.3, goal < 7.0  Controlled  Other Stroke Risk Factors  Advanced age  Obesity, Body mass index is 30.28 kg/m., recommend weight loss, diet and exercise as appropriate   Hx stroke/TIA  Coronary artery disease  Other Active Problems  Tick  bite, recent  Hospital day # 0  I have personally examined this patient, reviewed notes, independently viewed imaging studies, participated in medical decision making and plan of care.ROS completed by me personally and pertinent positives fully documented  I have made any additions or clarifications directly to the above note. He presented with transient episode of visual disturbance, speech abnormality followed by headache possibly computed migraine versus (TIA. Recommend aspirin 325 mg daily and continue ongoing TIA workup. Discuss with Dr. Candiss Norse. Greater than 50% and during this 35 minute visit was spent on counseling and coordination of care about TIA, computed migraine, discussion about prevention and treatment and answering questions.  Antony Contras, MD Medical Director [facility] Pager: [phone] 01/27/2017 4:15 PM   To contact Stroke Continuity provider, please refer to [email or web address] After hours, contact General Neurology

## 2017-01-27 NOTE — Consult Note (Signed)
Referring Physician: Dr. Alcario Drought    Chief Complaint: Transient expressive aphasia with confusion and right hemianopsia  HPI: Tyler Guerra. is an 66 y.o. male who presented to the ED on Tuesday with c/c of transient expressive aphasia with confusion and right hemianopsia. His symptoms began at about 4:00 PM yesterday according to one note in the chart, but patient tells Neurologist that he thinks onset may have been closer to 6 or 7 PM. He first noticed a problem when he went inside to feed his dog after working outside cutting down a tree. He became confused about what to fill the 3 bowls with, with a sensation of spatial disorientation. He looked in the mirror to check for a facial droop and there was none. He did notice partial obscuration of his right visual field. When he tried to speak, he had trouble coming up with the words, as well as pronouncing them. He had a severe left sided headache at the time of the symptoms, which is now resolved.  On arrival to the ED, he still had confusion and aphasia. At time of Neurological evaluation, these symptoms have resolved.   He endorses recent tick bite but has had no rash, stiff neck or fever. His PMHx includes CAD, TIA in March of 2017, anxiety, prostate CA, bilateral carotid artery stenosis, CAD, bilateral eye injuries, HLD, HTN and 4-vessel CABG in 2000.   CT head at [facility] ED was negative for acute changes. He was transferred to [facility] for further evaluation.   Home medications include ASA and atorvastatin.   Past Medical History:  Diagnosis Date  . Benign localized prostatic hyperplasia with lower urinary tract symptoms (LUTS)   . Carotid artery stenosis    PER DUPLEX 10-22-2015  BILATERAL ICA 1-39%  . Coronary artery disease    CARDIOLOGIST-  DR Daneen Schick--  VISIT EVERY OTHER YEAR-- [place] 2015  . GERD (gastroesophageal reflux disease)   . Gout   . H/O eye injury    chronic changes to left eye after injury  . History of TIA  (transient ischemic attack)    03/ 2017  no residual after brief episode loss peripheral vision  . Hyperlipemia   . Hypertension   . Prostate cancer ([place]) UROLOGIST-  DR GRAPEY/  ONCOLOGIST-  DR MANNING   dx 2015--- Stage T1c, Gleason 3+4, PSA 4.03, vol 44cc  . PVC's (premature ventricular contractions)   . 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 Wears glasses     Past Surgical History:  Procedure Laterality Date  . APPENDECTOMY  1975  . CARDIAC CATHETERIZATION  12-22-2001   dr Daneen Schick   severe native vessel disease mLAD 60-70%,  total occulsion pCFX  and pRCA/  widely patent saphenous vein , free radial , and LIMA grafts/  minminal lv dysfunction, ef 60%  . CORONARY ARTERY BYPASS GRAFT  May 2000   LIMA to LAD,  SVG to PDA and Diagonal, Free radial graft to OM  . Exericse treadmill test  last one 01-03-2014  dr Daneen Schick   normal exercise tolerance w/ hypertensive repsonse,  no ischemic EKG changes, appropriate HR response & recovery ([facility] TM score 9;  Low Risk , PVC's w/ exertion)  . PROSTATE BIOPSY    . RADIOACTIVE SEED IMPLANT N/A 01/13/2016   Procedure: RADIOACTIVE SEED IMPLANT/BRACHYTHERAPY IMPLANT;  Surgeon: Rana Snare, MD;  Location: [facility];  Service: Urology;  Laterality: N/A;    Family History  Problem  Relation Age of Onset  . Cancer Father        prostate in eighties  . Prostate cancer Father   . Heart disease Father   . Stroke Father   . Dementia Mother   . Cancer Sister        breast  . Cancer Paternal Uncle        prostate  . Colon cancer Neg Hx    Social History:  reports that he has never smoked. He has never used smokeless tobacco. He reports that he does not drink alcohol or use drugs.  Allergies: No Known Allergies  Home Medications:  sodium chloride (OCEAN) 0.65 % SOLN nasal spray Place 1 spray into both nostrils as needed for congestion. [provider] Needs Review  triamcinolone cream (KENALOG) 0.1 % Apply 1 application  topically at bedtime. Use without a glove initially. If not responding then use with a glove. Tonia Ghent, MD Needs Review   allopurinol (ZYLOPRIM) 300 MG tablet TAKE ONE TABLET BY MOUTH ONCE DAILY Etta Quill, DO Reordered  Orderedas:allopurinol (ZYLOPRIM) tablet 300 mg - 300 mg, Oral, Daily, First dose on Wed 01/27/17 at 1000  aspirin 81 MG tablet Take 81 mg by mouth daily. Etta Quill, DO Not Ordered  atorvastatin (LIPITOR) 40 MG tablet TAKE 1 TABLET (40 MG TOTAL) BY MOUTH DAILY. Etta Quill, DO Reordered  Orderedas:atorvastatin (LIPITOR) tablet 40 mg - 40 mg, Oral, Daily-1800, First dose on Wed 01/27/17 at 1800  diazepam (VALIUM) 5 MG tablet Take 0.5-1 tablets (2.5-5 mg total) by mouth 2 (two) times daily as needed for anxiety. Etta Quill, DO Reordered  Orderedas:diazepam (VALIUM) tablet 2.5-5 mg - 2.5-5 mg, Oral, 2 times daily PRN, anxiety, Starting Wed 01/27/17 at 0452  escitalopram (LEXAPRO) 10 MG tablet Take 0.5 tablets (5 mg total) by mouth daily. Etta Quill, DO Reordered  Orderedas:escitalopram [facility]) tablet 5 mg - 5 mg, Oral, Daily, First dose on Wed 01/27/17 at 1000  esomeprazole (NEXIUM) 20 MG capsule Take 20 mg by mouth daily at 12 noon. Etta Quill, DO Reordered  Orderedas:pantoprazole (PROTONIX) EC tablet 40 mg - 40 mg, Oral, Daily, First dose on Wed 01/27/17 at 1000  hydrochlorothiazide (HYDRODIURIL) 25 MG tablet TAKE ONE TABLET BY MOUTH ONCE DAILY Etta Quill, DO Not Ordered  Orderedas:hydrochlorothiazide (HYDRODIURIL) tablet 25 mg - 25 mg, Oral, Daily, First dose on Wed 01/27/17 at 1000 (Discontinued)  polyethylene glycol powder (GLYCOLAX/MIRALAX) powder Take 17 g by mouth daily as needed. Etta Quill, DO Not Ordered  Orderedas:polyethylene glycol powder (GLYCOLAX/MIRALAX) container 17 g - 17 g, Oral, Daily PRN, mild constipation, Starting Wed 01/27/17 at 0453 (Discontinued)  tamsulosin (FLOMAX) 0.4 MG CAPS capsule Take 0.4  mg by mouth 2 (two) times daily.  Etta Quill, DO Reordered    ROS: As per HPI.   Physical Examination: Blood pressure 130/79, pulse (!) 58, temperature 98.7 F (37.1 C), temperature source Oral, resp. rate 18, height 5' 10"  (1.778 m), weight 95.7 kg (211 lb), SpO2 99 %.  HEENT: [place]/AT Lungs: Respirations unlabored Ext: No edema  Neurologic Examination: Mental Status: Alert, fully oriented, thought content appropriate.  Speech fluent. Naming intact. Exhibited subtle difficulty with repetition and with comprehension of 2-step directional commands.  Cranial Nerves: II:  Visual fields intact with all 4 quadrants tested individually in both eyes. PERRL III,IV, VI: ptosis not present, EOMI without nystagmus V,VII: smile symmetric, facial temp sensation normal bilaterally VIII: hearing intact to voice  IX,X: no hypophonia XI: bilateral shoulder shrug is symmetric XII: midline tongue extension  Motor: Right : Upper extremity   5/5    Left:     Upper extremity   5/5  Lower extremity   5/5     Lower extremity   5/5 Normal tone throughout; no atrophy noted Sensory: Temperature and light touch intact x 4 without extinction Deep Tendon Reflexes:  2+ bilateral upper and lower extremities. Plantars: Right: downgoing  Left: downgoing Cerebellar: No ataxia with FNF bilaterally Gait: Deferred   Results for orders placed or performed during the hospital encounter of 01/26/17 (from the past 48 hour(s))  Ethanol     Status: None   Collection Time: 01/26/17  9:45 PM  Result Value Ref Range   Alcohol, Ethyl (B) <5 <5 mg/dL    Comment:        LOWEST DETECTABLE LIMIT FOR SERUM ALCOHOL IS 5 mg/dL FOR MEDICAL PURPOSES ONLY   Protime-INR     Status: None   Collection Time: 01/26/17  9:45 PM  Result Value Ref Range   Prothrombin Time 14.1 11.4 - 15.2 seconds   INR 1.09   APTT     Status: Abnormal   Collection Time: 01/26/17  9:45 PM  Result Value Ref Range   aPTT 37 (H) 24 - 36 seconds     Comment:        IF BASELINE aPTT IS ELEVATED, SUGGEST PATIENT RISK ASSESSMENT BE USED TO DETERMINE APPROPRIATE ANTICOAGULANT THERAPY.   CBC     Status: None   Collection Time: 01/26/17  9:45 PM  Result Value Ref Range   WBC 7.5 4.0 - 10.5 K/uL   RBC 4.55 4.22 - 5.81 MIL/uL   Hemoglobin 14.0 13.0 - 17.0 g/dL   HCT 40.4 39.0 - 52.0 %   MCV 88.8 78.0 - 100.0 fL   MCH 30.8 26.0 - 34.0 pg   MCHC 34.7 30.0 - 36.0 g/dL   RDW 13.0 11.5 - 15.5 %   Platelets 182 150 - 400 K/uL  Differential     Status: None   Collection Time: 01/26/17  9:45 PM  Result Value Ref Range   Neutrophils Relative % 71 %   Neutro Abs 5.3 1.7 - 7.7 K/uL   Lymphocytes Relative 21 %   Lymphs Abs 1.6 0.7 - 4.0 K/uL   Monocytes Relative 7 %   Monocytes Absolute 0.5 0.1 - 1.0 K/uL   Eosinophils Relative 1 %   Eosinophils Absolute 0.1 0.0 - 0.7 K/uL   Basophils Relative 0 %   Basophils Absolute 0.0 0.0 - 0.1 K/uL  Comprehensive metabolic panel     Status: Abnormal   Collection Time: 01/26/17  9:45 PM  Result Value Ref Range   Sodium 136 135 - 145 mmol/L   Potassium 3.3 (L) 3.5 - 5.1 mmol/L   Chloride 102 101 - 111 mmol/L   CO2 25 22 - 32 mmol/L   Glucose, Bld 122 (H) 65 - 99 mg/dL   BUN 16 6 - 20 mg/dL   Creatinine, Ser 1.16 0.61 - 1.24 mg/dL   Calcium 9.2 8.9 - 10.3 mg/dL   Total Protein 6.8 6.5 - 8.1 g/dL   Albumin 4.0 3.5 - 5.0 g/dL   AST 22 15 - 41 U/L   ALT 20 17 - 63 U/L   Alkaline Phosphatase 99 38 - 126 U/L   Total Bilirubin 1.2 0.3 - 1.2 mg/dL   GFR calc non Af Amer >60 >60 mL/min  GFR calc Af Amer >60 >60 mL/min    Comment: (NOTE) The eGFR has been calculated using the CKD EPI equation. This calculation has not been validated in all clinical situations. eGFR's persistently <60 mL/min signify possible Chronic Kidney Disease.    Anion gap 9 5 - 15  Urine rapid drug screen (hosp performed)not at [facility]     Status: None   Collection Time: 01/26/17  9:45 PM  Result Value Ref Range   Opiates  NONE DETECTED NONE DETECTED   Cocaine NONE DETECTED NONE DETECTED   Benzodiazepines NONE DETECTED NONE DETECTED   Amphetamines NONE DETECTED NONE DETECTED   Tetrahydrocannabinol NONE DETECTED NONE DETECTED   Barbiturates NONE DETECTED NONE DETECTED    Comment:        DRUG SCREEN FOR MEDICAL PURPOSES ONLY.  IF CONFIRMATION IS NEEDED FOR ANY PURPOSE, NOTIFY LAB WITHIN 5 DAYS.        LOWEST DETECTABLE LIMITS FOR URINE DRUG SCREEN Drug Class       Cutoff (ng/mL) Amphetamine      1000 Barbiturate      200 Benzodiazepine   564 Tricyclics       332 Opiates          300 Cocaine          300 THC              50   Urinalysis, Routine w reflex microscopic     Status: None   Collection Time: 01/26/17  9:45 PM  Result Value Ref Range   Color, Urine YELLOW YELLOW   APPearance CLEAR CLEAR   Specific Gravity, Urine 1.012 1.005 - 1.030   pH 6.0 5.0 - 8.0   Glucose, UA NEGATIVE NEGATIVE mg/dL   Hgb urine dipstick NEGATIVE NEGATIVE   Bilirubin Urine NEGATIVE NEGATIVE   Ketones, ur NEGATIVE NEGATIVE mg/dL   Protein, ur NEGATIVE NEGATIVE mg/dL   Nitrite NEGATIVE NEGATIVE   Leukocytes, UA NEGATIVE NEGATIVE    Comment: Microscopic not done on urines with negative protein, blood, leukocytes, nitrite, or glucose < 500 mg/dL.  Troponin I     Status: None   Collection Time: 01/26/17  9:45 PM  Result Value Ref Range   Troponin I <0.03 <0.03 ng/mL   Ct Head Wo Contrast  Result Date: 01/26/2017 CLINICAL DATA:  66 year old male with altered mental status and confusion for 1 day. EXAM: CT HEAD WITHOUT CONTRAST TECHNIQUE: Contiguous axial images were obtained from the base of the skull through the vertex without intravenous contrast. COMPARISON:  10/21/2015 head CT FINDINGS: Brain: No evidence of acute infarction, hemorrhage, hydrocephalus, extra-axial collection or mass lesion/mass effect. Chronic small-vessel white matter ischemic changes again noted. Vascular: Intracranial atherosclerotic  calcifications noted. Skull: Normal. Negative for fracture or focal lesion. Sinuses/Orbits: No acute finding. Other: None. IMPRESSION: No evidence of acute intracranial abnormality. Chronic small-vessel white matter ischemic changes. Electronically Signed   By: Margarette Canada M.D.   On: 01/26/2017 21:40    Assessment: 66 y.o. male with TIA symptoms referable to left MCA vascular distribution 1. CT head reveals no evidence of acute intracranial abnormality. Chronic small-vessel white matter ischemic changes are noted.  2. History of TIA in 2017 3. Stroke Risk Factors - CAD, prior TIA, bilateral carotid artery stenosis, HLD and HTN    Plan: 1. HgbA1c, fasting lipid panel 2. MRI, MRA of the brain without contrast 3. PT consult, OT consult, Speech consult 4. Echocardiogram 5. Carotid dopplers 6. Increase ASA to 325 mg po qd  for now. Stroke team may elect to place on DAPT.  7. Continue atorvastatin 8. Permissive HTN x 24-48 hours 9. Telemetry monitoring 10. Frequent neuro checks  @Electronically  signed: Dr. Kerney Elbe  01/27/2017, 6:44 AM

## 2017-01-27 NOTE — Progress Notes (Signed)
**  Preliminary report by tech**  Carotid artery duplex complete. Findings are consistent with a 1-39 percent stenosis involving the right internal carotid artery and the left internal carotid artery. The vertebral arteries demonstrate antegrade flow.  01/27/17 10:47 AM Tyler Guerra RVT

## 2017-01-27 NOTE — Progress Notes (Signed)
SLP Cancellation Note  Patient Details Name: Tyler Guerra. MRN: [id number] DOB: 29-Jul-1951   Cancelled treatment:       Reason Eval/Treat Not Completed: SLP screened, no needs identified, will sign off   [name] 01/27/2017, 1:36 PM

## 2017-01-27 NOTE — H&P (Signed)
History and Physical    Tyler Guerra. ZRA:[id number] DOB: 1950-10-08 DOA: 01/26/2017  PCP: Tonia Ghent, MD  Patient coming from: Home  I have personally briefly reviewed patient's old medical records in [place]  Chief Complaint: AMS  HPI: Tyler Guerra. is a 66 y.o. male with medical history significant of CAD, prior TIA in March of last year, prostate cancer, anxiety.  Patient presents to the ED with c/o AMS.  LKW 1350.  Thinks symptoms began around 1600 yesterday but not entirely sure.  Patient has aphasia and confusion in ED.  Now just with aphasia.  Had severe headache earlier which is better at this time.  No stiff neck, no fever.  Did have tick bite on ankle which he saw doctor for on the 22nd.  No rash.   ED Course: Has persistent mild-moderate expressive aphasia.  CT head neg.  Patient transferred for possible stroke.   Review of Systems: As per HPI otherwise 10 point review of systems negative.   Past Medical History:  Diagnosis Date  . Benign localized prostatic hyperplasia with lower urinary tract symptoms (LUTS)   . Carotid artery stenosis    PER DUPLEX 10-22-2015  BILATERAL ICA 1-39%  . Coronary artery disease    CARDIOLOGIST-  DR Daneen Schick--  VISIT EVERY OTHER YEAR-- [place] 2015  . GERD (gastroesophageal reflux disease)   . Gout   . H/O eye injury    chronic changes to left eye after injury  . History of TIA (transient ischemic attack)    03/ 2017  no residual after brief episode loss peripheral vision  . Hyperlipemia   . Hypertension   . Prostate cancer ([place]) UROLOGIST-  DR GRAPEY/  ONCOLOGIST-  DR MANNING   dx 2015--- Stage T1c, Gleason 3+4, PSA 4.03, vol 44cc  . PVC's (premature ventricular contractions)   . 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 Wears glasses     Past Surgical History:  Procedure Laterality Date  . APPENDECTOMY  1975  . CARDIAC CATHETERIZATION  12-22-2001   dr Daneen Schick   severe native vessel disease mLAD 60-70%,  total  occulsion pCFX  and pRCA/  widely patent saphenous vein , free radial , and LIMA grafts/  minminal lv dysfunction, ef 60%  . CORONARY ARTERY BYPASS GRAFT  May 2000   LIMA to LAD,  SVG to PDA and Diagonal, Free radial graft to OM  . Exericse treadmill test  last one 01-03-2014  dr Daneen Schick   normal exercise tolerance w/ hypertensive repsonse,  no ischemic EKG changes, appropriate HR response & recovery ([facility] TM score 9;  Low Risk , PVC's w/ exertion)  . PROSTATE BIOPSY    . RADIOACTIVE SEED IMPLANT N/A 01/13/2016   Procedure: RADIOACTIVE SEED IMPLANT/BRACHYTHERAPY IMPLANT;  Surgeon: Rana Snare, MD;  Location: [facility];  Service: Urology;  Laterality: N/A;     reports that he has never smoked. He has never used smokeless tobacco. He reports that he does not drink alcohol or use drugs.  No Known Allergies  Family History  Problem Relation Age of Onset  . Cancer Father        prostate in eighties  . Prostate cancer Father   . Heart disease Father   . Stroke Father   . Dementia Mother   . Cancer Sister        breast  . Cancer Paternal Uncle  prostate  . Colon cancer Neg Hx      Prior to Admission medications   Medication Sig Start Date End Date Taking? Authorizing Provider  allopurinol (ZYLOPRIM) 300 MG tablet TAKE ONE TABLET BY MOUTH ONCE DAILY 03/31/16   Tonia Ghent, MD  aspirin 81 MG tablet Take 81 mg by mouth daily.    [provider]  atorvastatin (LIPITOR) 40 MG tablet TAKE 1 TABLET (40 MG TOTAL) BY MOUTH DAILY. 09/30/16   Tonia Ghent, MD  diazepam (VALIUM) 5 MG tablet Take 0.5-1 tablets (2.5-5 mg total) by mouth 2 (two) times daily as needed for anxiety. 03/16/16   Tonia Ghent, MD  escitalopram (LEXAPRO) 10 MG tablet Take 0.5 tablets (5 mg total) by mouth daily. 01/22/17   Tonia Ghent, MD  esomeprazole (NEXIUM) 20 MG capsule Take 20 mg by mouth daily at 12 noon.    [provider]  hydrochlorothiazide (HYDRODIURIL)  25 MG tablet TAKE ONE TABLET BY MOUTH ONCE DAILY 03/31/16   Tonia Ghent, MD  polyethylene glycol powder (GLYCOLAX/MIRALAX) powder Take 17 g by mouth daily as needed. 03/16/16   Tonia Ghent, MD  sodium chloride (OCEAN) 0.65 % SOLN nasal spray Place 1 spray into both nostrils as needed for congestion.    [provider]  tamsulosin (FLOMAX) 0.4 MG CAPS capsule Take 0.4 mg by mouth 2 (two) times daily.     [provider]  triamcinolone cream (KENALOG) 0.1 % Apply 1 application topically at bedtime. Use without a glove initially.  If not responding then use with a glove. 05/15/16   Tonia Ghent, MD    Physical Exam: Vitals:   01/27/17 0000 01/27/17 0030 01/27/17 0100 01/27/17 0317  BP: (!) 148/88 (!) 141/88 137/89 135/87  Pulse: 61 69 65 66  Resp: 14 16 14 18   Temp:    98.5 F (36.9 C)  TempSrc:    Oral  SpO2: 98% 97% 98% 100%  Weight:    95.7 kg (211 lb)  Height:    5\' 10"  (1.778 m)    Constitutional: NAD, calm, comfortable Eyes: PERRL, lids and conjunctivae normal ENMT: Mucous membranes are moist. Posterior pharynx clear of any exudate or lesions.Normal dentition.  Neck: normal, supple, no masses, no thyromegaly Respiratory: clear to auscultation bilaterally, no wheezing, no crackles. Normal respiratory effort. No accessory muscle use.  Cardiovascular: Regular rate and rhythm, no murmurs / rubs / gallops. No extremity edema. 2+ pedal pulses. No carotid bruits.  Abdomen: no tenderness, no masses palpated. No hepatosplenomegaly. Bowel sounds positive.  Musculoskeletal: no clubbing / cyanosis. No joint deformity upper and lower extremities. Good ROM, no contractures. Normal muscle tone.  Skin: no rashes, lesions, ulcers. No induration Neurologic: Mild to moderate expressive aphasia. Psychiatric: Normal judgment and insight. Alert and oriented x 3. Normal mood.    Labs on Admission: I have personally reviewed following labs and imaging  studies  CBC:  Recent Labs Lab 01/26/17 2145  WBC 7.5  NEUTROABS 5.3  HGB 14.0  HCT 40.4  MCV 88.8  PLT 709   Basic Metabolic Panel:  Recent Labs Lab 01/26/17 2145  NA 136  K 3.3*  CL 102  CO2 25  GLUCOSE 122*  BUN 16  CREATININE 1.16  CALCIUM 9.2   GFR: Estimated Creatinine Clearance: 73.7 mL/min (by C-G formula based on SCr of 1.16 mg/dL). Liver Function Tests:  Recent Labs Lab 01/26/17 2145  AST 22  ALT 20  ALKPHOS 99  BILITOT  1.2  PROT 6.8  ALBUMIN 4.0   No results for input(s): LIPASE, AMYLASE in the last 168 hours. No results for input(s): AMMONIA in the last 168 hours. Coagulation Profile:  Recent Labs Lab 01/26/17 2145  INR 1.09   Cardiac Enzymes:  Recent Labs Lab 01/26/17 2145  TROPONINI <0.03   BNP (last 3 results) No results for input(s): PROBNP in the last 8760 hours. HbA1C: No results for input(s): HGBA1C in the last 72 hours. CBG: No results for input(s): GLUCAP in the last 168 hours. Lipid Profile: No results for input(s): CHOL, HDL, LDLCALC, TRIG, CHOLHDL, LDLDIRECT in the last 72 hours. Thyroid Function Tests: No results for input(s): TSH, T4TOTAL, FREET4, T3FREE, THYROIDAB in the last 72 hours. Anemia Panel: No results for input(s): VITAMINB12, FOLATE, FERRITIN, TIBC, IRON, RETICCTPCT in the last 72 hours. Urine analysis:    Component Value Date/Time   COLORURINE YELLOW 01/26/2017 2145   APPEARANCEUR CLEAR 01/26/2017 2145   LABSPEC 1.012 01/26/2017 2145   PHURINE 6.0 01/26/2017 2145   GLUCOSEU NEGATIVE 01/26/2017 2145   HGBUR NEGATIVE 01/26/2017 2145   BILIRUBINUR NEGATIVE 01/26/2017 2145   BILIRUBINUR 1+ 12/01/2016 1018   KETONESUR NEGATIVE 01/26/2017 2145   PROTEINUR NEGATIVE 01/26/2017 2145   UROBILINOGEN negative (A) 12/01/2016 1018   NITRITE NEGATIVE 01/26/2017 2145   LEUKOCYTESUR NEGATIVE 01/26/2017 2145    Radiological Exams on Admission: Ct Head Wo Contrast  Result Date: 01/26/2017 CLINICAL DATA:   66 year old male with altered mental status and confusion for 1 day. EXAM: CT HEAD WITHOUT CONTRAST TECHNIQUE: Contiguous axial images were obtained from the base of the skull through the vertex without intravenous contrast. COMPARISON:  10/21/2015 head CT FINDINGS: Brain: No evidence of acute infarction, hemorrhage, hydrocephalus, extra-axial collection or mass lesion/mass effect. Chronic small-vessel white matter ischemic changes again noted. Vascular: Intracranial atherosclerotic calcifications noted. Skull: Normal. Negative for fracture or focal lesion. Sinuses/Orbits: No acute finding. Other: None. IMPRESSION: No evidence of acute intracranial abnormality. Chronic small-vessel white matter ischemic changes. Electronically Signed   By: Margarette Canada M.D.   On: 01/26/2017 21:40    EKG: Independently reviewed.  Assessment/Plan Principal Problem:   Aphasia Active Problems:   Gout   HYPERTENSION, BENIGN ESSENTIAL   S/P CABG (coronary artery bypass graft)   Tick bite   Hypokalemia    1. Aphasia - 1. Stroke pathway and work up 2. Neurology to evaluate 3. MRI brain pending 4. ASA 325 for now (takes 81 chronically) 2. HTN - 1. Holding HCTZ pending MRI brain and eval by neurology 3. Tick bite - worth keeping recent tick bite in mind, however no fever, no meningismus, does have headache, CBC without obvious abnormalities, symptoms not classical for early disseminated (CNS) lyme nor at all typical for RMSF. 4. Hypokalemia - mild, replacing 5. H/o CAD s/p CABG - 1. Continue statin 2. Continue beta blocker 3. Increase ASA to 325 due to concern for possible acute stroke  DVT prophylaxis: Lovenox Code Status: Full Family Communication: No family in room Disposition Plan: Home after admit Consults called: Neurology Admission status: Place in 25   Hudsyn Champine, [facility] [place] Pager [phone]  If 7AM-7PM, please contact day team taking care of patient [email or web address] Password  [facility]  01/27/2017, 4:53 AM

## 2017-01-27 NOTE — Progress Notes (Signed)
@IPLOG @        PROGRESS NOTE                                                                                                                                                                                                             Patient Demographics:    Tyler Guerra, is a 66 y.o. male, DOB - 1951/05/10, VHQ:[id number]  Admit date - 01/26/2017   Admitting Physician Ivor Costa, MD  Outpatient Primary MD for the patient is Tonia Ghent, MD  LOS - 0  Chief Complaint  Patient presents with  . Altered Mental Status       Brief Narrative  Tyler Wiegand. is a 66 y.o. male with medical history significant of CAD, prior TIA in March of last year, prostate cancer, anxiety.  Patient presents to the ED with c/o AMS, He had episode of expressive aphasia, right visual field defect and mild confusion which lasted for a few hours. Also reported of a recent tick bite. His symptoms completely resolved in the ER. Head CT was unremarkable, he was seen by neurology and hospitalist team was requested to admit.   Subjective:    Tyler Guerra today has, No headache, No chest pain, No abdominal pain - No Nausea, No new weakness tingling or numbness, No Cough - SOB.     Assessment  & Plan :     1.TIA versus CVA. Symptoms have completely resolved, head CT unremarkable, neuro on board, LDL 71, A1c 6.3, currently on full dose aspirin which has been increased from 81 mg of aspirin at home, continue statin, MRI and echocardiogram pending, carotid duplex unremarkable. Complete stroke workup. Wait for further neurology input on 01/28/2017.  2. Dyslipidemia. LDL near goal, continue present dose statin.  3. BPH. On Flomax.  4. GERD. On PPI.  5. Anxiety depression. Continue Lexapro.  6. Gout. Stable on allopurinol.  7. CAD status post CABG. Continue statin and aspirin, beta blocker on hold for permissive hypertension for #1 above. Symptom-free.  8. Hypokalemia. Replaced we'll monitor.  9. History  of tick bite. Currently no concerns of active rickettsial fever, PCP to monitor.    Diet : Diet Heart Room service appropriate? Yes; Fluid consistency: Thin    Family Communication  : None  Code Status :  Full  Disposition Plan  :  Likely discharge 01/28/2017 if workup is negative  Consults  :  Neuro  Procedures  :     Carotid artery duplex complete.  Findings are consistent with a 1-39  percent stenosis involving the right internal carotid artery and the left internal carotid artery. The vertebral arteries demonstrate antegrade flow.  CT Head Non Acute  MRI  TTE    DVT Prophylaxis  :  Lovenox    Lab Results  Component Value Date   PLT 182 01/26/2017    Inpatient Medications  Scheduled Meds: . allopurinol  300 mg Oral Daily  . aspirin  325 mg Oral Daily  . atorvastatin  40 mg Oral q1800  . enoxaparin (LOVENOX) injection  40 mg Subcutaneous Q24H  . escitalopram  5 mg Oral Daily  . pantoprazole  40 mg Oral Daily  . tamsulosin  0.4 mg Oral BID   Continuous Infusions: PRN Meds:.acetaminophen **OR** [DISCONTINUED] acetaminophen (TYLENOL) oral liquid 160 mg/5 mL **OR** [DISCONTINUED] acetaminophen, diazepam, polyethylene glycol, senna-docusate  Antibiotics  :    Anti-infectives    Start     Dose/Rate Route Frequency Ordered Stop   01/27/17 0500  doxycycline (VIBRAMYCIN) 100 mg in dextrose 5 % 250 mL IVPB  Status:  Discontinued     100 mg 125 mL/hr over 120 Minutes Intravenous Every 12 hours 01/27/17 0441 01/27/17 0447         Objective:   Vitals:   01/27/17 0100 01/27/17 0317 01/27/17 0600 01/27/17 0925  BP: 137/89 135/87 130/79 116/61  Pulse: 65 66 (!) 58 62  Resp: 14 18 18    Temp:  98.5 F (36.9 C) 98.7 F (37.1 C)   TempSrc:  Oral Oral   SpO2: 98% 100% 99%   Weight:  95.7 kg (211 lb)    Height:  5\' 10"  (1.778 m)      Wt Readings from Last 3 Encounters:  01/27/17 95.7 kg (211 lb)  01/22/17 96.2 kg (212 lb)  12/01/16 97.9 kg (215 lb 12.8 oz)      Intake/Output Summary (Last 24 hours) at 01/27/17 1227 Last data filed at 01/27/17 0521  Gross per 24 hour  Intake                0 ml  Output              350 ml  Net             -350 ml     Physical Exam  Awake Alert, Oriented X 3, No new F.N deficits, Normal affect [place].AT,PERRAL Supple Neck,No JVD, No cervical lymphadenopathy appriciated.  Symmetrical Chest wall movement, Good air movement bilaterally, CTAB RRR,No Gallops,Rubs or new Murmurs, No Parasternal Heave +ve B.Sounds, Abd Soft, No tenderness, No organomegaly appriciated, No rebound - guarding or rigidity. No Cyanosis, Clubbing or edema, No new Rash or bruise     Data Review:    CBC  Recent Labs Lab 01/26/17 2145  WBC 7.5  HGB 14.0  HCT 40.4  PLT 182  MCV 88.8  MCH 30.8  MCHC 34.7  RDW 13.0  LYMPHSABS 1.6  MONOABS 0.5  EOSABS 0.1  BASOSABS 0.0    Chemistries   Recent Labs Lab 01/26/17 2145  NA 136  K 3.3*  CL 102  CO2 25  GLUCOSE 122*  BUN 16  CREATININE 1.16  CALCIUM 9.2  AST 22  ALT 20  ALKPHOS 99  BILITOT 1.2   ------------------------------------------------------------------------------------------------------------------  Recent Labs  01/27/17 0614  CHOL 129  HDL 44  LDLCALC 71  TRIG 70  CHOLHDL 2.9    Lab Results  Component Value Date   HGBA1C 6.3 (H) 05/15/2016   ------------------------------------------------------------------------------------------------------------------ No results for  input(s): TSH, T4TOTAL, T3FREE, THYROIDAB in the last 72 hours.  Invalid input(s): FREET3 ------------------------------------------------------------------------------------------------------------------ No results for input(s): VITAMINB12, FOLATE, FERRITIN, TIBC, IRON, RETICCTPCT in the last 72 hours.  Coagulation profile  Recent Labs Lab 01/26/17 2145  INR 1.09    No results for input(s): DDIMER in the last 72 hours.  Cardiac Enzymes  Recent Labs Lab  01/26/17 2145  TROPONINI <0.03   ------------------------------------------------------------------------------------------------------------------ No results found for: BNP  Micro Results No results found for this or any previous visit (from the past 240 hour(s)).  Radiology Reports Ct Head Wo Contrast  Result Date: 01/26/2017 CLINICAL DATA:  66 year old male with altered mental status and confusion for 1 day. EXAM: CT HEAD WITHOUT CONTRAST TECHNIQUE: Contiguous axial images were obtained from the base of the skull through the vertex without intravenous contrast. COMPARISON:  10/21/2015 head CT FINDINGS: Brain: No evidence of acute infarction, hemorrhage, hydrocephalus, extra-axial collection or mass lesion/mass effect. Chronic small-vessel white matter ischemic changes again noted. Vascular: Intracranial atherosclerotic calcifications noted. Skull: Normal. Negative for fracture or focal lesion. Sinuses/Orbits: No acute finding. Other: None. IMPRESSION: No evidence of acute intracranial abnormality. Chronic small-vessel white matter ischemic changes. Electronically Signed   By: Margarette Canada M.D.   On: 01/26/2017 21:40    Time Spent in minutes  30   Lala Lund M.D on 01/27/2017 at 12:27 PM  Between 7am to 7pm - Pager - [phone] ( page via [email or web address], text pages only, please mention full 10 digit call back number). After 7pm go to [email or web address] - password [facility]

## 2017-01-27 NOTE — Evaluation (Signed)
Physical Therapy Evaluation Patient Details Name: Tyler Guerra. MRN: [id number] DOB: May 31, 1951 Today's Date: 01/27/2017   History of Present Illness  Patient is a 66 y/o male who presents with transient expressive aphasia with confusion and right hemianopsia. Head CT- negative. MRI pending. PMH includes CABG, PVCs, HTN, HLD, TIA, CAD, hx of left eye injury.   Clinical Impression  Patient presents close to functional baseline and reports all symptoms have resolved. Tolerated gait and stair training with higher level balance challenges without difficulty or LOB. Scored 23/24 on DGI indicating decreased risk for falls. Pt independent from home with wife and retired in march. Education re: signs/symptoms of stroke. Pt does not require skilled therapy services. Discharge from therapy.     Follow Up Recommendations No PT follow up    Equipment Recommendations  None recommended by PT    Recommendations for Other Services       Precautions / Restrictions Precautions Precautions: None Restrictions Weight Bearing Restrictions: No      Mobility  Bed Mobility Overal bed mobility: Independent                Transfers Overall transfer level: Independent               General transfer comment: Stood from EOB x1, no difficulties. No dizziness.   Ambulation/Gait Ambulation/Gait assistance: Independent Ambulation Distance (Feet): 400 Feet Assistive device: None Gait Pattern/deviations: WFL(Within Functional Limits)   Gait velocity interpretation: at or above normal speed for age/gender General Gait Details: Steady gait with no drifting or LOB. See balance section for details.  Stairs Stairs: Yes Stairs assistance: Modified independent (Device/Increase time) Stair Management: One rail Left Number of Stairs: 13 General stair comments: Cues for technique; stubbed toe ascending 1 step but able to catch self.  Wheelchair Mobility    Modified Rankin (Stroke Patients  Only) Modified Rankin (Stroke Patients Only) Pre-Morbid Rankin Score: No symptoms Modified Rankin: No symptoms     Balance Overall balance assessment: Needs assistance   Sitting balance-Leahy Scale: Normal Sitting balance - Comments: Able to adjust socks without difficulty sitting EOB.   Standing balance support: During functional activity Standing balance-Leahy Scale: Normal               High level balance activites: Direction changes;Turns;Sudden stops;Head turns High Level Balance Comments: tolerated above with no deviations in gait pattern, no overt LOB. Standardized Balance Assessment Standardized Balance Assessment : Dynamic Gait Index   Dynamic Gait Index Level Surface: Normal Change in Gait Speed: Normal Gait with Horizontal Head Turns: Normal Gait with Vertical Head Turns: Normal Gait and Pivot Turn: Normal Step Over Obstacle: Normal Step Around Obstacles: Normal Steps: Mild Impairment Total Score: 23       Pertinent Vitals/Pain Pain Assessment: No/denies pain    Home Living Family/patient expects to be discharged to:: Private residence Living Arrangements: Spouse/significant other Available Help at Discharge: Family Type of Home: House Home Access: Level entry     Home Layout: Two level Home Equipment: None      Prior Function Level of Independence: Independent         Comments: Drives. 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     Hand Dominance   Dominant Hand: Right    Extremity/Trunk Assessment   Upper Extremity Assessment Upper Extremity Assessment: Defer to OT evaluation;Overall [facility] for tasks assessed    Lower Extremity Assessment Lower Extremity Assessment: Overall WFL for tasks assessed    Cervical / Trunk Assessment Cervical /  Trunk Assessment: Normal  Communication   Communication: No difficulties  Cognition Arousal/Alertness: Awake/alert Behavior During Therapy: WFL for tasks assessed/performed Overall Cognitive  Status: Within Functional Limits for tasks assessed                                        General Comments      Exercises     Assessment/Plan    PT Assessment Patent does not need any further PT services  PT Problem List         PT Treatment Interventions      PT Goals (Current goals can be found in the Care Plan section)  Acute Rehab PT Goals PT Goal Formulation: All assessment and education complete, DC therapy    Frequency     Barriers to discharge        Co-evaluation               AM-PAC PT "6 Clicks" Daily Activity  Outcome Measure Difficulty turning over in bed (including adjusting bedclothes, sheets and blankets)?: None Difficulty moving from lying on back to sitting on the side of the bed? : None Difficulty sitting down on and standing up from a chair with arms (e.g., wheelchair, bedside commode, etc,.)?: None Help needed moving to and from a bed to chair (including a wheelchair)?: None Help needed walking in hospital room?: None Help needed climbing 3-5 steps with a railing? : None 6 Click Score: 24    End of Session   Activity Tolerance: Patient tolerated treatment well Patient left: in bed;with call bell/phone within reach Nurse Communication: Mobility status PT Visit Diagnosis: Other abnormalities of gait and mobility (R26.89)    Time: 2119-4174 PT Time Calculation (min) (ACUTE ONLY): 19 min   Charges:   PT Evaluation $PT Eval Low Complexity: 1 Procedure     PT G Codes:   PT G-Codes **NOT FOR INPATIENT CLASS** Functional Assessment Tool Used: Clinical judgement Functional Limitation: Mobility: Walking and moving around Mobility: Walking and Moving Around Current Status ([id number]): At least 1 percent but less than 20 percent impaired, limited or restricted Mobility: Walking and Moving Around Goal Status [phone]): 0 percent impaired, limited or restricted Mobility: Walking and Moving Around Discharge Status [phone]): 0 percent  impaired, limited or restricted    [place], PT, DPT [place] 01/27/2017, 9:05 AM

## 2017-01-27 NOTE — Discharge Instructions (Signed)
Follow with Primary MD Tonia Ghent, MD in 7 days   Get CBC, CMP, 2 view Chest X ray checked  by Primary MD or SNF MD in 5-7 days ( we routinely change or add medications that can affect your baseline labs and fluid status, therefore we recommend that you get the mentioned basic workup next visit with your PCP, your PCP may decide not to get them or add new tests based on their clinical decision)  Activity: As tolerated with Full fall precautions use walker/cane & assistance as needed  Disposition Home    Diet:  Heart Healthy    For Heart failure patients - Check your Weight same time everyday, if you gain over 2 pounds, or you develop in leg swelling, experience more shortness of breath or chest pain, call your Primary MD immediately. Follow Cardiac Low Salt Diet and 1.5 lit/day fluid restriction.  On your next visit with your primary care physician please Get Medicines reviewed and adjusted.  Please request your Prim.MD to go over all Hospital Tests and Procedure/Radiological results at the follow up, please get all Hospital records sent to your Prim MD by signing hospital release before you go home.  If you experience worsening of your admission symptoms, develop shortness of breath, life threatening emergency, suicidal or homicidal thoughts you must seek medical attention immediately by calling 911 or calling your MD immediately  if symptoms less severe.  You Must read complete instructions/literature along with all the possible adverse reactions/side effects for all the Medicines you take and that have been prescribed to you. Take any new Medicines after you have completely understood and accpet all the possible adverse reactions/side effects.   Do not drive, operate heavy machinery, perform activities at heights, swimming or participation in water activities or provide baby sitting services if your were admitted for syncope or siezures until you have seen by Primary MD or a Neurologist  and advised to do so again.  Do not drive when taking Pain medications.    Do not take more than prescribed Pain, Sleep and Anxiety Medications  Special Instructions: If you have smoked or chewed Tobacco  in the last 2 yrs please stop smoking, stop any regular Alcohol  and or any Recreational drug use.  Wear Seat belts while driving.   Please note  You were cared for by a hospitalist during your hospital stay. If you have any questions about your discharge medications or the care you received while you were in the hospital after you are discharged, you can call the unit and asked to speak with the hospitalist on call if the hospitalist that took care of you is not available. Once you are discharged, your primary care physician will handle any further medical issues. Please note that NO REFILLS for any discharge medications will be authorized once you are discharged, as it is imperative that you return to your primary care physician (or establish a relationship with a primary care physician if you do not have one) for your aftercare needs so that they can reassess your need for medications and monitor your lab values.

## 2017-01-28 ENCOUNTER — Observation Stay (HOSPITAL_COMMUNITY): Payer: Medicare Other

## 2017-01-28 DIAGNOSIS — R4701 Aphasia: Secondary | ICD-10-CM | POA: Diagnosis not present

## 2017-01-28 DIAGNOSIS — I10 Essential (primary) hypertension: Secondary | ICD-10-CM | POA: Diagnosis not present

## 2017-01-28 DIAGNOSIS — M109 Gout, unspecified: Secondary | ICD-10-CM | POA: Diagnosis not present

## 2017-01-28 DIAGNOSIS — E785 Hyperlipidemia, unspecified: Secondary | ICD-10-CM | POA: Diagnosis not present

## 2017-01-28 DIAGNOSIS — I638 Other cerebral infarction: Secondary | ICD-10-CM | POA: Diagnosis not present

## 2017-01-28 DIAGNOSIS — E876 Hypokalemia: Secondary | ICD-10-CM | POA: Diagnosis not present

## 2017-01-28 LAB — BASIC METABOLIC PANEL
ANION GAP: 6 (ref 5–15)
BUN: 12 mg/dL (ref 6–20)
CHLORIDE: 108 mmol/L (ref 101–111)
CO2: 25 mmol/L (ref 22–32)
Calcium: 9 mg/dL (ref 8.9–10.3)
Creatinine, Ser: 0.98 mg/dL (ref 0.61–1.24)
GFR calc non Af Amer: >60 mL/min (ref >60)
Glucose, Bld: 95 mg/dL (ref 65–99)
Potassium: 3.6 mmol/L (ref 3.5–5.1)
Sodium: 139 mmol/L (ref 135–145)

## 2017-01-28 LAB — HEMOGLOBIN A1C
Hgb A1c MFr Bld: 6.5 % — ABNORMAL HIGH (ref 4.8–5.6)
Mean Plasma Glucose: 140 mg/dL

## 2017-01-28 LAB — ECHOCARDIOGRAM COMPLETE
HEIGHTINCHES: 70 in
Weight: 3376 oz

## 2017-01-28 MED ORDER — PERFLUTREN LIPID MICROSPHERE
1.0000 mL | INTRAVENOUS | Status: AC | PRN
  Administered 2017-01-28: 2 mL via INTRAVENOUS
  Filled 2017-01-28: qty 10

## 2017-01-28 NOTE — Progress Notes (Signed)
Pt has been discharged home with daughter. IV and telemetry box removed. Pt and pt's daughter received discharge instructions. All questions answered. Pt left with all of his belongings.  Grant Fontana BSN, RN

## 2017-01-28 NOTE — Discharge Summary (Signed)
Physician Discharge Summary  Tyler Guerra. ZOX:[id number] DOB: 11-Dec-1950 DOA: 01/26/2017  PCP: Tonia Ghent, MD  Admit date: 01/26/2017 Discharge date: 01/28/2017  Time spent: 35 minutes  Recommendations for Outpatient Follow-up:  1. Repeat BMET to follow electrolytes and renal function  2. Reassess BP and adjust medications as needed  3. Keep an eye on patient CBG's; A1C 6.3; at risk to develop diabetes in the future.   Discharge Diagnoses:  Principal Problem:   Aphasia Active Problems:   Gout   HYPERTENSION, BENIGN ESSENTIAL   S/P CABG (coronary artery bypass graft)   Tick bite   Hypokalemia   Discharge Condition: stable and improved. Discharge home with instructions to follow up with PCP and with neurology as an outpatient.  Diet recommendation: heart healthy diet   Filed Weights   01/26/17 2049 01/27/17 0317  Weight: 96.2 kg (212 lb) 95.7 kg (211 lb)    Brief History of present illness:  66 y.o.malewith medical history significant of CAD, prior TIA in March of last year, prostate cancer, anxiety. Patient presents to the ED with c/o AMS, He had episode of expressive aphasia, right visual field defect and mild confusion which lasted for a few hours. Also reported of a recent tick bite. His symptoms completely resolved in the ER. Head CT was unremarkable, he was seen by neurology and will be admitted for TIA/stroke workup.  Hospital Course:  1-TIA/possibly computed migraine: -neurology has recommended full dose aspirin for secondary prevention -Echo w/o source of emboli, preserved EF and no wall motion abnormalities -MRI/CT head without acute intracranial abnormalities -mild plaque seen on carotid duplex -LDL 71 and A1C 6.3 -continue risk factors modifications -outpatient follow up with neurology for further assessment and work up of HA's  2-HTN -stable -continue current antihypertensive regimen   3-HLD -continue Lipitor  4-hx of CAD -stable. -no  CP -will continue ASA and statins -continue heart healthy diet and BP control  5-GERD -will continue nexium   6-depression and anxiety  -will continue lexapro and valium   7-hx of gout -continue allopurinol  Hypokalemia -most likely associated with use of HCTZ -electrolytes repleted  -recommend BMET at follow up to assess electrolytes   Procedures:  See below for x-ray reports   Carotid duplex: 1-39% bilateral ICA, vertebral arteries with antegrade flow.   2-D echo - Left ventricle: The cavity size was normal. There was mild   concentric hypertrophy. Systolic function was normal. The   estimated ejection fraction was in the range of 55% to 60%. Wall   motion was normal; there were no regional wall motion   abnormalities. - Aortic valve: Trileaflet; normal thickness, mildly calcified   leaflets. Valve area (VTI): 2.14 cm^2. Valve area (Vmax): 1.96   cm^2. Valve area (Vmean): 2.18 cm^2.  Impressions: - No cardiac source of emboli was indentified.  Consultations:  Neurology   Discharge Exam: Vitals:   01/28/17 0346 01/28/17 1327  BP: 126/79 118/72  Pulse: 63 71  Resp: 18 19  Temp: 98.7 F (37.1 C) 98.1 F (36.7 C)    General: AAOX3; no CP, no SOB, normal affect and denies HA's and neurologic deficit. Cardiovascular: S1 and S2, no rubs, no gallops Respiratory:good air movement bilaterally, no wheezing, no crackles  Abd: soft, NT, ND, positive BS Extremities: no edema, no cyanosis, no clubbing  Neurologic exam: no focal deficit, MS 5/5 bilaterally, CN intact, no dysarthria and no drift.  Discharge Instructions   Discharge Instructions    Diet - low  sodium heart healthy    Complete by:  As directed    Diet - low sodium heart healthy    Complete by:  As directed    Discharge instructions    Complete by:  As directed    Follow with Primary MD Tonia Ghent, MD in 7 days   Get CBC, CMP, 2 view Chest X ray checked  by Primary MD or SNF MD in 5-7 days  ( we routinely change or add medications that can affect your baseline labs and fluid status, therefore we recommend that you get the mentioned basic workup next visit with your PCP, your PCP may decide not to get them or add new tests based on their clinical decision)  Activity: As tolerated with Full fall precautions use walker/cane & assistance as needed  Disposition Home    Diet:  Heart Healthy    For Heart failure patients - Check your Weight same time everyday, if you gain over 2 pounds, or you develop in leg swelling, experience more shortness of breath or chest pain, call your Primary MD immediately. Follow Cardiac Low Salt Diet and 1.5 lit/day fluid restriction.  On your next visit with your primary care physician please Get Medicines reviewed and adjusted.  Please request your Prim.MD to go over all Hospital Tests and Procedure/Radiological results at the follow up, please get all Hospital records sent to your Prim MD by signing hospital release before you go home.  If you experience worsening of your admission symptoms, develop shortness of breath, life threatening emergency, suicidal or homicidal thoughts you must seek medical attention immediately by calling 911 or calling your MD immediately  if symptoms less severe.  You Must read complete instructions/literature along with all the possible adverse reactions/side effects for all the Medicines you take and that have been prescribed to you. Take any new Medicines after you have completely understood and accpet all the possible adverse reactions/side effects.   Do not drive, operate heavy machinery, perform activities at heights, swimming or participation in water activities or provide baby sitting services if your were admitted for syncope or siezures until you have seen by Primary MD or a Neurologist and advised to do so again.  Do not drive when taking Pain medications.    Do not take more than prescribed Pain, Sleep and Anxiety  Medications  Special Instructions: If you have smoked or chewed Tobacco  in the last 2 yrs please stop smoking, stop any regular Alcohol  and or any Recreational drug use.  Wear Seat belts while driving.   Please note  You were cared for by a hospitalist during your hospital stay. If you have any questions about your discharge medications or the care you received while you were in the hospital after you are discharged, you can call the unit and asked to speak with the hospitalist on call if the hospitalist that took care of you is not available. Once you are discharged, your primary care physician will handle any further medical issues. Please note that NO REFILLS for any discharge medications will be authorized once you are discharged, as it is imperative that you return to your primary care physician (or establish a relationship with a primary care physician if you do not have one) for your aftercare needs so that they can reassess your need for medications and monitor your lab values.   Discharge instructions    Complete by:  As directed    Follow heart healthy diet Avoid second  hand smoking Please arrange follow up with PCP Follow up in as needed basis with neurology service (Dr. Leonie Man)   Increase activity slowly    Complete by:  As directed      Current Discharge Medication List    CONTINUE these medications which have CHANGED   Details  aspirin 325 MG tablet Take 1 tablet (325 mg total) by mouth daily. Qty: 30 tablet, Refills: 0      CONTINUE these medications which have NOT CHANGED   Details  allopurinol (ZYLOPRIM) 300 MG tablet TAKE ONE TABLET BY MOUTH ONCE DAILY Qty: 90 tablet, Refills: 3    atorvastatin (LIPITOR) 40 MG tablet TAKE 1 TABLET (40 MG TOTAL) BY MOUTH DAILY. Qty: 180 tablet, Refills: 0    Cholecalciferol (VITAMIN D PO) Take 1 capsule by mouth daily.    escitalopram (LEXAPRO) 10 MG tablet Take 0.5 tablets (5 mg total) by mouth daily.    esomeprazole  (NEXIUM) 20 MG capsule Take 20 mg by mouth daily at 12 noon.    hydrochlorothiazide (HYDRODIURIL) 25 MG tablet TAKE ONE TABLET BY MOUTH ONCE DAILY Qty: 90 tablet, Refills: 3    Multiple Vitamins-Minerals (OCUVITE PO) Take 1 capsule by mouth daily.    tamsulosin (FLOMAX) 0.4 MG CAPS capsule Take 0.4 mg by mouth 2 (two) times daily.     diazepam (VALIUM) 5 MG tablet Take 0.5-1 tablets (2.5-5 mg total) by mouth 2 (two) times daily as needed for anxiety. Qty: 10 tablet, Refills: 0    polyethylene glycol powder (GLYCOLAX/MIRALAX) powder Take 17 g by mouth daily as needed.    sodium chloride (OCEAN) 0.65 % SOLN nasal spray Place 1 spray into both nostrils as needed for congestion.    triamcinolone cream (KENALOG) 0.1 % Apply 1 application topically at bedtime. Use without a glove initially.  If not responding then use with a glove. Qty: 80 g, Refills: 2       No Known Allergies Follow-up Information    Tonia Ghent, MD. Schedule an appointment as soon as possible for a visit in 1 week(s).   Specialty:  Family Medicine Contact information: [place] [address] [phone]        Garvin Fila, MD. Schedule an appointment as soon as possible for a visit in 1 week(s).   Specialties:  Neurology, Radiology Contact information: [address] [place] [address] [phone]           The results of significant diagnostics from this hospitalization (including imaging, microbiology, ancillary and laboratory) are listed below for reference.    Significant Diagnostic Studies: Ct Head Wo Contrast  Result Date: 01/26/2017 CLINICAL DATA:  66 year old male with altered mental status and confusion for 1 day. EXAM: CT HEAD WITHOUT CONTRAST TECHNIQUE: Contiguous axial images were obtained from the base of the skull through the vertex without intravenous contrast. COMPARISON:  10/21/2015 head CT FINDINGS: Brain: No evidence of acute infarction,  hemorrhage, hydrocephalus, extra-axial collection or mass lesion/mass effect. Chronic small-vessel white matter ischemic changes again noted. Vascular: Intracranial atherosclerotic calcifications noted. Skull: Normal. Negative for fracture or focal lesion. Sinuses/Orbits: No acute finding. Other: None. IMPRESSION: No evidence of acute intracranial abnormality. Chronic small-vessel white matter ischemic changes. Electronically Signed   By: Margarette Canada M.D.   On: 01/26/2017 21:40   Mr Brain Wo Contrast  Result Date: 01/27/2017 CLINICAL DATA:  Episode of expressive aphasia, right visual field defects, and mild confusion lasting for a few hours. EXAM:  MRI HEAD WITHOUT CONTRAST MRA HEAD WITHOUT CONTRAST TECHNIQUE: Multiplanar, multiecho pulse sequences of the brain and surrounding structures were obtained without intravenous contrast. Angiographic images of the head were obtained using MRA technique without contrast. COMPARISON:  Head CT 01/26/2017 FINDINGS: MRI HEAD FINDINGS Brain: There is no evidence of acute infarct, intracranial hemorrhage, mass, midline shift, or extra-axial fluid collection. The there is mild cerebral atrophy. Patchy T2 hyperintensities in the deep cerebral white matter bilaterally are nonspecific but compatible with moderate chronic small vessel ischemic disease. 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 Vascular: Major intracranial vascular flow voids are preserved. Skull and upper cervical spine: Unremarkable bone marrow signal. Sinuses/Orbits: Left cataract extraction. Trace left mastoid effusion. Clear paranasal sinuses. Other: None. MRA HEAD FINDINGS The study is mildly motion degraded. The visualized distal vertebral arteries are patent with the right being dominant. There is suggestion of a moderate stenosis of the distal left vertebral artery just distal to the PICA origin, however this appearance may be exaggerated by motion (or possibly completely artifactual).  The basilar artery is patent with a suspected small fenestration proximally, a normal variant. No significant basilar artery stenosis is seen. There is a large right posterior communicating artery with hypoplastic right P1 segment. PCAs are patent without evidence of significant proximal stenosis. The internal carotid arteries are widely patent from skullbase to carotid termini. The right A1 segment is absent. The left A1 segment is widely patent and supplies the right ACA. The MCAs are patent without evidence of proximal branch occlusion or significant stenosis. No intracranial aneurysm is identified. IMPRESSION: 1. No acute intracranial abnormality. 2. Moderate chronic small vessel ischemic disease. 3. Motion degraded head MRA. Patent circle of Willis without proximal branch occlusion or significant anterior circulation stenosis. Stenosis versus artifact near the left vertebrobasilar junction. Electronically Signed   By: Logan Bores M.D.   On: 01/27/2017 13:45   Mr Jodene Nam Head/brain HQ Cm  Result Date: 01/27/2017 CLINICAL DATA:  Episode of expressive aphasia, right visual field defects, and mild confusion lasting for a few hours. EXAM: MRI HEAD WITHOUT CONTRAST MRA HEAD WITHOUT CONTRAST TECHNIQUE: Multiplanar, multiecho pulse sequences of the brain and surrounding structures were obtained without intravenous contrast. Angiographic images of the head were obtained using MRA technique without contrast. COMPARISON:  Head CT 01/26/2017 FINDINGS: MRI HEAD FINDINGS Brain: There is no evidence of acute infarct, intracranial hemorrhage, mass, midline shift, or extra-axial fluid collection. The there is mild cerebral atrophy. Patchy T2 hyperintensities in the deep cerebral white matter bilaterally are nonspecific but compatible with moderate chronic small vessel ischemic disease. 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 Vascular: Major intracranial vascular flow voids are preserved. Skull and upper  cervical spine: Unremarkable bone marrow signal. Sinuses/Orbits: Left cataract extraction. Trace left mastoid effusion. Clear paranasal sinuses. Other: None. MRA HEAD FINDINGS The study is mildly motion degraded. The visualized distal vertebral arteries are patent with the right being dominant. There is suggestion of a moderate stenosis of the distal left vertebral artery just distal to the PICA origin, however this appearance may be exaggerated by motion (or possibly completely artifactual). The basilar artery is patent with a suspected small fenestration proximally, a normal variant. No significant basilar artery stenosis is seen. There is a large right posterior communicating artery with hypoplastic right P1 segment. PCAs are patent without evidence of significant proximal stenosis. The internal carotid arteries are widely patent from skullbase to carotid termini. The right A1 segment  is absent. The left A1 segment is widely patent and supplies the right ACA. The MCAs are patent without evidence of proximal branch occlusion or significant stenosis. No intracranial aneurysm is identified. IMPRESSION: 1. No acute intracranial abnormality. 2. Moderate chronic small vessel ischemic disease. 3. Motion degraded head MRA. Patent circle of Willis without proximal branch occlusion or significant anterior circulation stenosis. Stenosis versus artifact near the left vertebrobasilar junction. Electronically Signed   By: Logan Bores M.D.   On: 01/27/2017 13:45    Labs: Basic Metabolic Panel:  Recent Labs Lab 01/26/17 2145 01/28/17 0311  NA 136 139  K 3.3* 3.6  CL 102 108  CO2 25 25  GLUCOSE 122* 95  BUN 16 12  CREATININE 1.16 0.98  CALCIUM 9.2 9.0   Liver Function Tests:  Recent Labs Lab 01/26/17 2145  AST 22  ALT 20  ALKPHOS 99  BILITOT 1.2  PROT 6.8  ALBUMIN 4.0   CBC:  Recent Labs Lab 01/26/17 2145  WBC 7.5  NEUTROABS 5.3  HGB 14.0  HCT 40.4  MCV 88.8  PLT 182   Cardiac  Enzymes:  Recent Labs Lab 01/26/17 2145  TROPONINI <0.03    Signed:  Barton Dubois MD.  [facility] 01/28/2017, 1:32 PM

## 2017-01-28 NOTE — Progress Notes (Signed)
STROKE TEAM PROGRESS NOTE   HISTORY OF PRESENT ILLNESS (per record) Tyler Lurz. is an 66 y.o. male who presented to the ED on 01/26/2017 with c/c of transient expressive aphasia with confusion and right hemianopsia. His symptoms began at about 4:00 PM 01/26/2017 according to one note in the chart, but patient tells Neurologist that he thinks onset may have been closer to 6 or 7 PM. He first noticed a problem when he went inside to feed his dog after working outside cutting down a tree. He became confused about what to fill the 3 bowls with, with a sensation of spatial disorientation. He looked in the mirror to check for a facial droop and there was none. He did notice partial obscuration of his right visual field. When he tried to speak, he had trouble coming up with the words, as well as pronouncing them. He had a severe left sided headache at the time of the symptoms, which is now resolved.  On arrival to the ED, he still had confusion and aphasia. At time of Neurological evaluation, these symptoms have resolved.   He endorses recent tick bite but has had no rash, stiff neck or fever. His PMHx includes CAD, TIA in March of 2017, anxiety, prostate CA, bilateral carotid artery stenosis, CAD, bilateral eye injuries, HLD, HTN and 4-vessel CABG in 2000.   CT head at [facility] ED was negative for acute changes. He was transferred to [facility] for further evaluation.  Stroke workup pending.  Home medications include ASA and atorvastatin.   Patient was not administered IV t-PA secondary to presenting outside of the treatment window. He was admitted to General Neurology for further evaluation and treatment.   SUBJECTIVE (INTERVAL HISTORY) He is doing well without any recurrent symptoms.   OBJECTIVE Temp:  [98.2 F (36.8 C)-98.7 F (37.1 C)] 98.7 F (37.1 C) (06/28 0346) Pulse Rate:  [59-67] 63 (06/28 0346) Cardiac Rhythm: Sinus bradycardia (06/28 0700) Resp:  [18-20] 18 (06/28 0346) BP:  (126-144)/(72-84) 126/79 (06/28 0346) SpO2:  [97 %-100 %] 99 % (06/28 0346)  CBC:   Recent Labs Lab 01/26/17 2145  WBC 7.5  NEUTROABS 5.3  HGB 14.0  HCT 40.4  MCV 88.8  PLT 951    Basic Metabolic Panel:   Recent Labs Lab 01/26/17 2145 01/28/17 0311  NA 136 139  K 3.3* 3.6  CL 102 108  CO2 25 25  GLUCOSE 122* 95  BUN 16 12  CREATININE 1.16 0.98  CALCIUM 9.2 9.0    Lipid Panel:     Component Value Date/Time   CHOL 129 01/27/2017 0614   TRIG 70 01/27/2017 0614   HDL 44 01/27/2017 0614   CHOLHDL 2.9 01/27/2017 0614   VLDL 14 01/27/2017 0614   LDLCALC 71 01/27/2017 0614   HgbA1c:  Lab Results  Component Value Date   HGBA1C 6.5 (H) 01/27/2017   Urine Drug Screen:     Component Value Date/Time   LABOPIA NONE DETECTED 01/26/2017 2145   COCAINSCRNUR NONE DETECTED 01/26/2017 2145   LABBENZ NONE DETECTED 01/26/2017 2145   AMPHETMU NONE DETECTED 01/26/2017 2145   THCU NONE DETECTED 01/26/2017 2145   LABBARB NONE DETECTED 01/26/2017 2145    Alcohol Level     Component Value Date/Time   ETH <5 01/26/2017 2145    IMAGING  Ct Head Wo Contrast 01/26/2017 IMPRESSION: No evidence of acute intracranial abnormality. Chronic small-vessel white matter ischemic changes.  MR MRA head 01/27/2017 No acute infarct.  MRA shows  no large vessel stenosis.    PHYSICAL EXAM  Pleasant middle-age male currently not in distress. . Afebrile. Head is nontraumatic. Neck is supple without bruit.    Cardiac exam no murmur or gallop. Lungs are clear to auscultation. Distal pulses are well felt. Neurological Exam ;  Awake  Alert oriented x 3. Normal speech and language.eye movements full without nystagmus.fundi were not visualized. Vision acuity and fields appear normal. Hearing is normal. Palatal movements are normal. Face symmetric. Tongue midline. Normal strength, tone, reflexes and coordination. Normal sensation. Gait deferred.  ASSESSMENT/PLAN Mr. Tyler Guerra. is a 66  y.o. male with history of TIA, carotid artery stenosis, coronary artery disease (s/p CABG x4), prostate cancer, hyperlipidemia, hypertension, PVCs, and eye injury presenting with transient expressive aphasia with confusion and right hemianopsia. He did not receive IV t-PA due to arriving outside of the treatment [place].   Left brain TIA versus complicated migraine episode   Resultant  No deficits  CT head: no stroke  MRI head:  No acute infarct MRA head Motion degraded head MRA. Patent circle of Willis without proximal branch occlusion or significant anterior circulation stenosis. Stenosis versus artifact near the left vertebrobasilar  junction.  Carotid Doppler  No significant stenosis  2D Echo   pending   LDL 71  HgbA1c 6.3  Lovenox 40 mg sq daily for VTE prophylaxis Diet Heart Room service appropriate? Yes; Fluid consistency: Thin Diet - low sodium heart healthy  No antithrombotic prior to admission, now on aspirin 325 mg daily  Patient counseled to be compliant with his antithrombotic medications  Ongoing aggressive stroke risk factor management  Therapy recommendations:   None  Disposition:  home  Hypertension  Stable  Permissive hypertension (OK if < 220/120) but gradually normalize in 5-7 days  Long-term BP goal normotensive  Hyperlipidemia  Home meds:  Atorvastatin 40mg  PO daily, resumed in hospital  LDL 71, goal < 70  Continue statin at discharge  Prediabetes  HgbA1c 6.3, goal < 7.0  Controlled  Other Stroke Risk Factors  Advanced age  Obesity, Body mass index is 30.28 kg/m., recommend weight loss, diet and exercise as appropriate   Hx stroke/TIA  Coronary artery disease  Other Active Problems  Tick bite, recent  Hospital day # 0    He presented with transient episode of visual disturbance, speech abnormality followed by headache possibly computed migraine versus (TIA. Recommend aspirin 325 mg daily and continue ongoing TIA workup.  Discuss with Dr.Madera. Stroke team will sign off. Calll for questions Antony Contras, MD Medical Director [place] Pager: [phone] 01/28/2017 12:41 PM   To contact Stroke Continuity provider, please refer to [email or web address] After hours, contact General Neurology

## 2017-01-28 NOTE — Progress Notes (Signed)
  Echocardiogram 2D Echocardiogram has been performed.  Nestor Wieneke L Androw 01/28/2017, 11:02 AM

## 2017-02-01 ENCOUNTER — Encounter: Payer: Self-pay | Admitting: Family Medicine

## 2017-02-01 ENCOUNTER — Ambulatory Visit (INDEPENDENT_AMBULATORY_CARE_PROVIDER_SITE_OTHER): Payer: Medicare Other | Admitting: Family Medicine

## 2017-02-01 VITALS — BP 110/80 | HR 82 | Temp 98.9°F | Wt 211.0 lb

## 2017-02-01 DIAGNOSIS — R739 Hyperglycemia, unspecified: Secondary | ICD-10-CM

## 2017-02-01 DIAGNOSIS — R4701 Aphasia: Secondary | ICD-10-CM

## 2017-02-01 MED ORDER — TAMSULOSIN HCL 0.4 MG PO CAPS: 0.4000 mg | ORAL_CAPSULE | Freq: Two times a day (BID) | ORAL | 3 refills | Status: DC

## 2017-02-01 MED ORDER — ALLOPURINOL 300 MG PO TABS: 300.0000 mg | ORAL_TABLET | Freq: Every day | ORAL | 3 refills | Status: DC

## 2017-02-01 MED ORDER — HYDROCHLOROTHIAZIDE 25 MG PO TABS: 25.0000 mg | ORAL_TABLET | Freq: Every day | ORAL | 3 refills | Status: DC

## 2017-02-01 NOTE — Patient Instructions (Signed)
Go to the lab on the way out.  We'll contact you with your lab report. Work on a low carb diet in the meantime.  Recheck A1c in about 3 months prior to a visit.  Take care.  Glad to see you.  Update me as needed.

## 2017-02-01 NOTE — Progress Notes (Signed)
He was working outside all day prior to the start of sx.  He was sweating profusely and didn't drink a lot of fluids as the day went on.  He knew something wasn't right and had vision changes and speech changes.  Admitted with AMS.    Inpatient course discussed with patient.  He is back to normal now.  No residual sx.  no remaining deficits.  A1c up, d/w pt- 6.5.  Discussed low carbohydrate diet. Handout given.  PMH and SH reviewed  ROS: Per HPI unless specifically indicated in ROS section   Meds, vitals, and allergies reviewed.   GEN: nad, alert and oriented HEENT: mucous membranes moist NECK: supple w/o LA CV: rrr.  no murmur PULM: ctab, no inc wob ABD: soft, +bs EXT: no edema SKIN: no acute rash CN 2-12 wnl B, S/S/DTR wnl x4

## 2017-02-02 ENCOUNTER — Other Ambulatory Visit: Payer: Self-pay | Admitting: Family Medicine

## 2017-02-02 DIAGNOSIS — R739 Hyperglycemia, unspecified: Secondary | ICD-10-CM

## 2017-02-02 LAB — BASIC METABOLIC PANEL
BUN: 19 mg/dL (ref 6–23)
CALCIUM: 9.7 mg/dL (ref 8.4–10.5)
CO2: 31 mEq/L (ref 19–32)
Chloride: 102 mEq/L (ref 96–112)
Creatinine, Ser: 1.2 mg/dL (ref 0.40–1.50)
GFR: 64.56 mL/min (ref >60.00)
Glucose, Bld: 97 mg/dL (ref 70–99)
POTASSIUM: 3.5 meq/L (ref 3.5–5.1)
SODIUM: 140 meq/L (ref 135–145)

## 2017-02-02 LAB — HEMOGLOBIN A1C: HEMOGLOBIN A1C: 6.6 % — AB (ref 4.6–6.5)

## 2017-02-02 NOTE — Assessment & Plan Note (Signed)
ddx d/w pt:  No CVA see.   Dehydration/heat sx, complicated migraine, TIA, or some combination of the 3 could have contributed.  He has no residual deficit. Imaging was negative. The goal is for secondary prevention he truly did have a TIA. Rationale for current medications discussed with patient. Hyperglycemia versus borderline diabetes discussed with patient. Handout given. Recheck A1c in about 3 months after he has been working more on low carbohydrate diet. Recheck routine labs today given the R eye abnormalities he had as an inpatient. Rationale discussed with patient. He agrees. >25 minutes spent in face to face time with patient, >50% spent in counselling or coordination of care.

## 2017-02-23 ENCOUNTER — Other Ambulatory Visit: Payer: Self-pay | Admitting: Family Medicine

## 2017-04-19 ENCOUNTER — Other Ambulatory Visit: Payer: Self-pay

## 2017-04-19 MED ORDER — ATORVASTATIN CALCIUM 40 MG PO TABS: 40.0000 mg | ORAL_TABLET | Freq: Every day | ORAL | 1 refills | Status: DC

## 2017-04-19 NOTE — Telephone Encounter (Signed)
Tyler Guerra ([place] signed) [facility]; pt seen 02/01/17. Refilled per protocol # 90 x 1. Pt will call for f/u appt. Tyler Guerra voiced understanding.

## 2017-05-19 DIAGNOSIS — N401 Enlarged prostate with lower urinary tract symptoms: Secondary | ICD-10-CM | POA: Diagnosis not present

## 2017-05-19 DIAGNOSIS — R351 Nocturia: Secondary | ICD-10-CM | POA: Diagnosis not present

## 2017-05-19 DIAGNOSIS — Z8546 Personal history of malignant neoplasm of prostate: Secondary | ICD-10-CM | POA: Diagnosis not present

## 2017-05-31 DIAGNOSIS — Z23 Encounter for immunization: Secondary | ICD-10-CM | POA: Diagnosis not present

## 2017-06-30 DIAGNOSIS — Z8546 Personal history of malignant neoplasm of prostate: Secondary | ICD-10-CM | POA: Diagnosis not present

## 2017-09-28 ENCOUNTER — Other Ambulatory Visit: Payer: Self-pay | Admitting: Family Medicine

## 2017-12-29 ENCOUNTER — Other Ambulatory Visit: Payer: Self-pay | Admitting: Family Medicine

## 2017-12-29 NOTE — Telephone Encounter (Signed)
Spoke with patient and got him scheduled for AWV with Lattie Haw and Dr [name] on 02/18/2018-Tyler Guerra Estell Harpin, RMA

## 2017-12-29 NOTE — Telephone Encounter (Signed)
Please schedule CPE then send back for refill when scheduled.

## 2018-01-10 ENCOUNTER — Other Ambulatory Visit: Payer: Self-pay | Admitting: Family Medicine

## 2018-02-14 ENCOUNTER — Other Ambulatory Visit: Payer: Self-pay | Admitting: Family Medicine

## 2018-02-14 DIAGNOSIS — E119 Type 2 diabetes mellitus without complications: Secondary | ICD-10-CM

## 2018-02-14 DIAGNOSIS — M109 Gout, unspecified: Secondary | ICD-10-CM

## 2018-02-18 ENCOUNTER — Encounter: Payer: Medicare Other | Admitting: Family Medicine

## 2018-02-18 ENCOUNTER — Ambulatory Visit: Payer: Medicare Other

## 2018-03-01 ENCOUNTER — Encounter: Payer: Self-pay | Admitting: Family Medicine

## 2018-03-01 ENCOUNTER — Ambulatory Visit (INDEPENDENT_AMBULATORY_CARE_PROVIDER_SITE_OTHER): Payer: Medicare Other

## 2018-03-01 ENCOUNTER — Ambulatory Visit (INDEPENDENT_AMBULATORY_CARE_PROVIDER_SITE_OTHER): Payer: Medicare Other | Admitting: Family Medicine

## 2018-03-01 VITALS — BP 122/78 | HR 82 | Temp 98.6°F | Ht 69.0 in | Wt 209.2 lb

## 2018-03-01 DIAGNOSIS — E785 Hyperlipidemia, unspecified: Secondary | ICD-10-CM

## 2018-03-01 DIAGNOSIS — C61 Malignant neoplasm of prostate: Secondary | ICD-10-CM

## 2018-03-01 DIAGNOSIS — E119 Type 2 diabetes mellitus without complications: Secondary | ICD-10-CM

## 2018-03-01 DIAGNOSIS — R29898 Other symptoms and signs involving the musculoskeletal system: Secondary | ICD-10-CM | POA: Diagnosis not present

## 2018-03-01 DIAGNOSIS — I10 Essential (primary) hypertension: Secondary | ICD-10-CM | POA: Diagnosis not present

## 2018-03-01 DIAGNOSIS — M109 Gout, unspecified: Secondary | ICD-10-CM

## 2018-03-01 DIAGNOSIS — Z23 Encounter for immunization: Secondary | ICD-10-CM | POA: Diagnosis not present

## 2018-03-01 DIAGNOSIS — F411 Generalized anxiety disorder: Secondary | ICD-10-CM

## 2018-03-01 DIAGNOSIS — Z0000 Encounter for general adult medical examination without abnormal findings: Secondary | ICD-10-CM | POA: Diagnosis not present

## 2018-03-01 DIAGNOSIS — Z7189 Other specified counseling: Secondary | ICD-10-CM

## 2018-03-01 LAB — COMPREHENSIVE METABOLIC PANEL
ALK PHOS: 112 U/L (ref 39–117)
ALT: 21 U/L (ref 0–53)
AST: 26 U/L (ref 0–37)
Albumin: 4.3 g/dL (ref 3.5–5.2)
BILIRUBIN TOTAL: 1.1 mg/dL (ref 0.2–1.2)
BUN: 27 mg/dL — AB (ref 6–23)
CO2: 31 meq/L (ref 19–32)
CREATININE: 1.08 mg/dL (ref 0.40–1.50)
Calcium: 9.6 mg/dL (ref 8.4–10.5)
Chloride: 103 mEq/L (ref 96–112)
GFR: 72.67 mL/min (ref >60.00)
GLUCOSE: 133 mg/dL — AB (ref 70–99)
Potassium: 3.4 mEq/L — ABNORMAL LOW (ref 3.5–5.1)
SODIUM: 141 meq/L (ref 135–145)
Total Protein: 7.3 g/dL (ref 6.0–8.3)

## 2018-03-01 LAB — LIPID PANEL
CHOL/HDL RATIO: 3
Cholesterol: 125 mg/dL (ref 0–200)
HDL: 47.6 mg/dL (ref >39.00)
LDL Cholesterol: 63 mg/dL (ref 0–99)
NONHDL: 77.69
Triglycerides: 72 mg/dL (ref 0.0–149.0)
VLDL: 14.4 mg/dL (ref 0.0–40.0)

## 2018-03-01 LAB — MICROALBUMIN / CREATININE URINE RATIO
Creatinine,U: 346.4 mg/dL
Microalb Creat Ratio: 0.5 mg/g (ref 0.0–30.0)
Microalb, Ur: 1.7 mg/dL (ref 0.0–1.9)

## 2018-03-01 LAB — HEMOGLOBIN A1C: HEMOGLOBIN A1C: 6.6 % — AB (ref 4.6–6.5)

## 2018-03-01 LAB — URIC ACID: URIC ACID, SERUM: 4.9 mg/dL (ref 4.0–7.8)

## 2018-03-01 MED ORDER — ESCITALOPRAM OXALATE 5 MG PO TABS: 5.0000 mg | ORAL_TABLET | Freq: Every day | ORAL | 3 refills | Status: DC

## 2018-03-01 NOTE — Progress Notes (Signed)
Subjective:   Tyler Guerra. is a 67 y.o. male who presents for Medicare Annual/Subsequent preventive examination.  Review of Systems:  N/A Cardiac Risk Factors include: advanced age (>64men, >37 women);male gender;dyslipidemia;hypertension;obesity (BMI >30kg/m2)     Objective:    Vitals: BP 122/78   Pulse 82   Temp 98.6 F (37 C) (Oral)   Ht 5\' 9"  (1.753 m)   Wt 209 lb 4 oz (94.9 kg)   SpO2 96%   BMI 30.90 kg/m   Body mass index is 30.9 kg/m.  Advanced Directives 03/01/2018 01/27/2017 03/22/2016 03/22/2016 01/13/2016 10/18/2015 08/19/2015  Does Patient Have a Medical Advance Directive? No No No No No No No  Would patient like information on creating a medical advance directive? Yes (MAU/Ambulatory/Procedural Areas - Information given) No - Patient declined No - patient declined information - Yes Higher education careers adviser given Yes - Scientist, clinical (histocompatibility and immunogenetics) given No - patient declined information    Tobacco Social History   Tobacco Use  Smoking Status Never Smoker  Smokeless Tobacco Never Used     Counseling given: No   Clinical Intake:  Pre-visit preparation completed: Yes  Pain : No/denies pain Pain Score: 0-No pain     Nutritional Status: BMI > 30  Obese Nutritional Risks: None Diabetes: No  How often do you need to have someone help you when you read instructions, pamphlets, or other written materials from your doctor or pharmacy?: 1 - Never What is the last grade level you completed in school?: 12th grade  Interpreter Needed?: No  Comments: pt lives with spouse Information entered by :: LPinson, LPN  Past Medical History:  Diagnosis Date  . Benign localized prostatic hyperplasia with lower urinary tract symptoms (LUTS)   . Carotid artery stenosis    PER DUPLEX 10-22-2015  BILATERAL ICA 1-39%  . Coronary artery disease    CARDIOLOGIST-  DR Daneen Schick--  VISIT EVERY OTHER YEAR-- [place] 2015  . GERD (gastroesophageal reflux disease)   . Gout   . H/O eye  injury    chronic changes to left eye after injury  . History of TIA (transient ischemic attack)    03/ 2017  no residual after brief episode loss peripheral vision  . Hyperlipemia   . Hypertension   . Prostate cancer ([place]) UROLOGIST-  DR GRAPEY/  ONCOLOGIST-  DR MANNING   dx 2015--- Stage T1c, Gleason 3+4, PSA 4.03, vol 44cc  . PVC's (premature ventricular contractions)   . 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 Wears glasses    Past Surgical History:  Procedure Laterality Date  . APPENDECTOMY  1975  . CARDIAC CATHETERIZATION  12-22-2001   dr Daneen Schick   severe native vessel disease mLAD 60-70%,  total occulsion pCFX  and pRCA/  widely patent saphenous vein , free radial , and LIMA grafts/  minminal lv dysfunction, ef 60%  . CORONARY ARTERY BYPASS GRAFT  May 2000   LIMA to LAD,  SVG to PDA and Diagonal, Free radial graft to OM  . Exericse treadmill test  last one 01-03-2014  dr Daneen Schick   normal exercise tolerance w/ hypertensive repsonse,  no ischemic EKG changes, appropriate HR response & recovery ([facility] TM score 9;  Low Risk , PVC's w/ exertion)  . PROSTATE BIOPSY    . RADIOACTIVE SEED IMPLANT N/A 01/13/2016   Procedure: RADIOACTIVE SEED IMPLANT/BRACHYTHERAPY IMPLANT;  Surgeon: Rana Snare, MD;  Location: [facility];  Service: Urology;  Laterality:  N/A;   Family History  Problem Relation Age of Onset  . Cancer Father        prostate in eighties  . Prostate cancer Father   . Heart disease Father   . Stroke Father   . Dementia Mother   . Cancer Sister        breast  . Cancer Paternal Uncle        prostate  . Colon cancer Neg Hx    Social History   Socioeconomic History  . Marital status: Married    Spouse name: Not on file  . Number of children: 2  . Years of education: Not on file  . Highest education level: Not on file  Occupational History  . 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 Financial resource strain: Not on file  . Food insecurity:    Worry: Not on file    Inability: Not on file  . Transportation needs:    Medical: Not on file    Non-medical: Not on file  Tobacco Use  . Smoking status: Never Smoker  . Smokeless tobacco: Never Used  Substance and Sexual Activity  . Alcohol use: No  . Drug use: No  . Sexual activity: Yes  Lifestyle  . Physical activity:    Days per week: Not on file    Minutes per session: Not on file  . Stress: Not on file  Relationships  . Social connections:    Talks on phone: Not on file    Gets together: Not on file    Attends religious service: Not on file    Active member of club or organization: Not on file    Attends meetings of clubs or organizations: Not on file    Relationship status: Not on file  Other Topics Concern  . Not on file  Social History Narrative   Married 1974   2 daughters and 2 grandkids   Retired 2018.      Outpatient Encounter Medications as of 03/01/2018  Medication Sig  . allopurinol (ZYLOPRIM) 300 MG tablet TAKE 1 TABLET DAILY  . aspirin 325 MG tablet Take 1 tablet (325 mg total) by mouth daily.  [name] atorvastatin (LIPITOR) 40 MG tablet TAKE 1 TABLET DAILY  . Cholecalciferol (VITAMIN D PO) Take 1 capsule by mouth 2 (two) times daily.   [name] escitalopram (LEXAPRO) 5 MG tablet Take 1 tablet (5 mg total) by mouth daily.  [name] esomeprazole (NEXIUM) 20 MG capsule Take 20 mg by mouth daily at 12 noon.  . hydrochlorothiazide (HYDRODIURIL) 25 MG tablet TAKE 1 TABLET DAILY  . Misc Natural Products (BLACK CHERRY CONCENTRATE PO) Take by mouth at bedtime.  . Multiple Vitamins-Minerals (OCUVITE PO) Take 1 capsule by mouth daily.  . sodium chloride (OCEAN) 0.65 % SOLN nasal spray Place 1 spray into both nostrils as needed for congestion.  . tamsulosin (FLOMAX) 0.4 MG CAPS capsule TAKE 1 CAPSULE TWICE A DAY  . diazepam (VALIUM) 5 MG tablet Take 0.5-1 tablets (2.5-5 mg total) by mouth 2 (two)  times daily as needed for anxiety. (Patient not taking: Reported on 03/01/2018)  . [DISCONTINUED] escitalopram (LEXAPRO) 10 MG tablet Take 0.5 tablets (5 mg total) daily.   No facility-administered encounter medications on file as of 03/01/2018.     Activities of Daily Living In your present state of health, do you have any difficulty performing the following  activities: 03/01/2018  Hearing? Y  Vision? Y  Difficulty concentrating or making decisions? N  Walking or climbing stairs? N  Dressing or bathing? N  Doing errands, shopping? N  Preparing Food and eating ? N  Using the Toilet? N  In the past six months, have you accidently leaked urine? N  Do you have problems with loss of bowel control? N  Managing your Medications? N  Managing your Finances? N  Housekeeping or managing your Housekeeping? N  Some recent data might be hidden    Patient Care Team: Tonia Ghent, MD as PCP - General   Assessment:   This is a routine wellness examination for [place].  Hearing Screening Comments: Hearing aids  Vision Screening Comments: Vision exam in May 2019 with Dr. Orvan Seen   Exercise Activities and Dietary recommendations Current Exercise Habits: Home exercise routine, Type of exercise: walking, Frequency (Times/Week): 3, Intensity: Mild, Exercise limited by: None identified  Goals    . Increase physical activity     Starting 03/01/2018, I will continue to walk at least 1 mile 3 days per week.        Fall Risk Fall Risk  03/01/2018 08/19/2015 08/19/2015  Falls in the past year? Yes No No  Comment pt fell while walking dog and caused injury to left knee - -  Number falls in past yr: 1 - -  Injury with Fall? Yes - -     Depression Screen PHQ 2/9 Scores 03/01/2018 08/19/2015 08/19/2015  PHQ - 2 Score 0 0 0  PHQ- 9 Score 0 - -    Cognitive Function MMSE - Mini Mental State Exam 03/01/2018  Orientation to time 5  Orientation to Place 5  Registration 3  Attention/ Calculation 0    Recall 3  Language- name 2 objects 0  Language- repeat 1  Language- follow 3 step command 3  Language- read & follow direction 0  Write a sentence 0  Copy design 0  Total score 20       PLEASE NOTE: A Mini-Cog screen was completed. Maximum score is 20. A value of 0 denotes this part of Folstein MMSE was not completed or the patient failed this part of the Mini-Cog screening.   Mini-Cog Screening Orientation to Time - Max 5 pts Orientation to Place - Max 5 pts Registration - Max 3 pts Recall - Max 3 pts Language Repeat - Max 1 pts Language Follow 3 Step Command - Max 3 pts   Immunization History  Administered Date(s) Administered  . Influenza Split 04/28/2012  . Influenza Whole 05/03/2010  . Influenza,inj,Quad PF,6+ Mos 04/20/2013, 05/11/2014, 05/31/2015, 05/11/2016  . Pneumococcal Conjugate-13 03/01/2018  . Pneumococcal Polysaccharide-23 08/04/2007, 04/20/2013  . Td 08/03/2005  . Tdap 05/15/2016  . Zoster 10/06/2015    Screening Tests Health Maintenance  Topic Date Due  . INFLUENZA VACCINE  03/03/2018  . URINE MICROALBUMIN  03/02/2019  . PNA vac Low Risk Adult (2 of 2 - PPSV23) 03/02/2019  . TETANUS/TDAP  05/15/2026  . COLONOSCOPY  01/20/2027  . Hepatitis C Screening  Completed      Plan:     I have personally reviewed, addressed, and noted the following in the patient's chart:  A. Medical and social history B. Use of alcohol, tobacco or illicit drugs  C. Current medications and supplements D. Functional ability and status E.  Nutritional status F.  Physical activity G. Advance directives H. List of other physicians I.  Hospitalizations, surgeries, and ER visits  in previous 12 months J.  [place] to include hearing, vision, cognitive, depression L. Referrals and appointments - none  In addition, I have reviewed and discussed with patient certain preventive protocols, quality metrics, and best practice recommendations. A written personalized  care plan for preventive services as well as general preventive health recommendations were provided to patient.  See attached scanned questionnaire for additional information.   Signed,   Lindell Noe, MHA, BS, LPN Health Coach

## 2018-03-01 NOTE — Patient Instructions (Signed)
Mr. Desa , Thank you for taking time to come for your Medicare Wellness Visit. I appreciate your ongoing commitment to your health goals. Please review the following plan we discussed and let me know if I can assist you in the future.   These are the goals we discussed: Goals    . Increase physical activity     Starting 03/01/2018, I will continue to walk at least 1 mile 3 days per week.        This is a list of the screening recommended for you and due dates:  Health Maintenance  Topic Date Due  . Flu Shot  03/03/2018  . Urine Protein Check  03/02/2019  . Pneumonia vaccines (2 of 2 - PPSV23) 03/02/2019  . Tetanus Vaccine  05/15/2026  . Colon Cancer Screening  01/20/2027  .  Hepatitis C: One time screening is recommended by Center for Disease Control  (CDC) for  adults born from [birth year] through 1964.   Completed   Preventive Care for Adults  A healthy lifestyle and preventive care can promote health and wellness. Preventive health guidelines for adults include the following key practices.  . A routine yearly physical is a good way to check with your health care provider about your health and preventive screening. It is a chance to share any concerns and updates on your health and to receive a thorough exam.  . Visit your dentist for a routine exam and preventive care every 6 months. Brush your teeth twice a day and floss once a day. Good oral hygiene prevents tooth decay and gum disease.  . The frequency of eye exams is based on your age, health, family medical history, use  of contact lenses, and other factors. Follow your health care provider's recommendations for frequency of eye exams.  . Eat a healthy diet. Foods like vegetables, fruits, whole grains, low-fat dairy products, and lean protein foods contain the nutrients you need without too many calories. Decrease your intake of foods high in solid fats, added sugars, and salt. Eat the right amount of calories for you. Get  information about a proper diet from your health care provider, if necessary.  . Regular physical exercise is one of the most important things you can do for your health. Most adults should get at least 150 minutes of moderate-intensity exercise (any activity that increases your heart rate and causes you to sweat) each week. In addition, most adults need muscle-strengthening exercises on 2 or more days a week.  Silver Sneakers may be a benefit available to you. To determine eligibility, you may visit the website: www.silversneakers.com or contact program at (269)105-0409 Mon-Fri between 8AM-8PM.   . Maintain a healthy weight. The body mass index (BMI) is a screening tool to identify possible weight problems. It provides an estimate of body fat based on height and weight. Your health care provider can find your BMI and can help you achieve or maintain a healthy weight.   For adults 20 years and older: ? A BMI below 18.5 is considered underweight. ? A BMI of 18.5 to 24.9 is normal. ? A BMI of 25 to 29.9 is considered overweight. ? A BMI of 30 and above is considered obese.   . Maintain normal blood lipids and cholesterol levels by exercising and minimizing your intake of saturated fat. Eat a balanced diet with plenty of fruit and vegetables. Blood tests for lipids and cholesterol should begin at age 17 and be repeated every 5 years. If your  lipid or cholesterol levels are high, you are over 50, or you are at high risk for heart disease, you may need your cholesterol levels checked more frequently. Ongoing high lipid and cholesterol levels should be treated with medicines if diet and exercise are not working.  . If you smoke, find out from your health care provider how to quit. If you do not use tobacco, please do not start.  . If you choose to drink alcohol, please do not consume more than 2 drinks per day. One drink is considered to be 12 ounces (355 mL) of beer, 5 ounces (148 mL) of wine, or 1.5  ounces (44 mL) of liquor.  . If you are 74-75 years old, ask your health care provider if you should take aspirin to prevent strokes.  . Use sunscreen. Apply sunscreen liberally and repeatedly throughout the day. You should seek shade when your shadow is shorter than you. Protect yourself by wearing long sleeves, pants, a wide-brimmed hat, and sunglasses year round, whenever you are outdoors.  . Once a month, do a whole body skin exam, using a mirror to look at the skin on your back. Tell your health care provider of new moles, moles that have irregular borders, moles that are larger than a pencil eraser, or moles that have changed in shape or color.

## 2018-03-01 NOTE — Patient Instructions (Addendum)
Go to the lab on the way out.  We'll contact you with your lab report. See Pinson on the way out.   Call cardiology and urology about follow up.   Take care.  Glad to see you.

## 2018-03-01 NOTE — Progress Notes (Signed)
Hypertension:    Using medication without problems or lightheadedness:  yes Chest pain with exertion:no Edema:no Short of breath:no Labs pending.    Anxiety.  Clearly improved compared to prev.  Still on lexapro.  No ADE on med.  He didn't want to stop med at this point and I support that.    Gout.  No ADE on meds.  Due for f/u labs.   No recent flares.    Elevated Cholesterol: Using medications without problems:yes Muscle aches: no Diet compliance: yes- intentional overall weight loss.   Exercise:yes  He had an episodes after working on his car and after chopping wood and had sig transient R hand motor changes where his grip got weaker.  No R foot sx at the time.  It got better after resting for about 30 min.  No residual sx.  Unclear if this was heat related or due to transient nerve compression but all of his sx are resolved in the meantime.  He did not have any of the left-sided symptoms.  No chest pain.  Was not short of breath.  DM.  No meds.  Due for f/u labs.  See notes on labs.  No foot tingling.    Prostate cancer.  PSA per urology, d/w pt.  I'll defer.  Still on flomax with some likely relief from med, stream is good as is.  0-1 nocturia now.    Living will d/w pt.  Wife designated if patient were incapacitated. He is going to get hearing aids next week.     Meds, vitals, and allergies reviewed.   PMH and SH reviewed  ROS: Per HPI unless specifically indicated in ROS section   GEN: nad, alert and oriented HEENT: mucous membranes moist NECK: supple w/o LA CV: rrr. PULM: ctab, no inc wob ABD: soft, +bs EXT: no edema SKIN: no acute rash Normal grip B, no weakness. Normal sensation.   Strength and sensation grossly normal in the bilateral lower extremities.  Gait normal.  Diabetic foot exam: Normal inspection No skin breakdown No calluses  Normal DP pulses Normal sensation to light touch and monofilament Nails normal

## 2018-03-01 NOTE — Progress Notes (Signed)
PCP notes:   Health maintenance:  Microalbumin - completed PCV13 - administered  Abnormal screenings:   Fall risk - hx of single fall Fall Risk  03/01/2018 08/19/2015 08/19/2015  Falls in the past year? Yes No No  Comment pt fell while walking dog and caused injury to left knee - -  Number falls in past yr: 1 - -  Injury with Fall? Yes - -   Patient concerns:   None  Nurse concerns:  None  Next PCP appt:   N/A; CPE prior to [place] L[facility]   I reviewed health advisor's note, was available for consultation on the day of service listed in this note, and agree with documentation and plan. Elsie Stain, MD.

## 2018-03-02 DIAGNOSIS — R29898 Other symptoms and signs involving the musculoskeletal system: Secondary | ICD-10-CM | POA: Insufficient documentation

## 2018-03-02 DIAGNOSIS — E119 Type 2 diabetes mellitus without complications: Secondary | ICD-10-CM | POA: Insufficient documentation

## 2018-03-02 DIAGNOSIS — Z8639 Personal history of other endocrine, nutritional and metabolic disease: Secondary | ICD-10-CM | POA: Insufficient documentation

## 2018-03-02 HISTORY — DX: Other symptoms and signs involving the musculoskeletal system: R29.898

## 2018-03-02 HISTORY — DX: Type 2 diabetes mellitus without complications: E11.9

## 2018-03-02 NOTE — Assessment & Plan Note (Signed)
Reasonable control.  Continue current medication.  Recheck labs today.  He agrees.

## 2018-03-02 NOTE — Assessment & Plan Note (Signed)
Clearly improved compared to prev.  Still on lexapro.  No ADE on med.  He didn't want to stop med at this point and I support that.   Continue as is.

## 2018-03-02 NOTE — Assessment & Plan Note (Signed)
No ADE on meds.  Due for f/u labs.   No recent flares.  See notes on labs.

## 2018-03-02 NOTE — Assessment & Plan Note (Signed)
Continue statin.  See notes on labs.  No adverse effect on medication.  Discussed with patient.  He agrees.

## 2018-03-02 NOTE — Assessment & Plan Note (Signed)
Living will d/w pt.  Wife designated if patient were incapacitated. He is going to get hearing aids next week.

## 2018-03-02 NOTE — Assessment & Plan Note (Addendum)
Resolved now.  He had this previous episode after he had been doing a lot of work outside.  He been working on his car and chopping wood too.  He is right-hand dominant.  After the fact he had some symptoms in the right hand where he noticed his grip was transiently different.  He did not have any other symptoms in the arm or in the leg.  He had no other right-sided symptoms.  He did not know if it was due to muscle fatigue from relative overuse with activity in combination with recent high midday temperatures.  He was working in the heat when this was happening.  He did not know if it was related to peripheral nerve compression given the relative overuse of the extremity/hand.  This does not sound typical for stroke.  He had no other symptoms in the meantime.  He has no symptoms now.  I think is reasonable to observe for now given the timeline and absence of sx in the interval.  We talked about reasonable heat/exertion cautions and he will update me as needed.  He is already on appropriate cardiovascular medications with hydrochlorothiazide, aspirin, Lipitor due to his other baseline conditions.

## 2018-03-02 NOTE — Assessment & Plan Note (Addendum)
No medications currently.  Due for follow-up labs.  He is working on diet and exercise. See notes on labs.  >25 minutes spent in face to face time with patient, >50% spent in counselling or coordination of care, discussing diabetes, gout, cholesterol, high blood pressure, etc.

## 2018-03-02 NOTE — Assessment & Plan Note (Signed)
PSA per urology, d/w pt.  I'll defer.  Still on flomax with some likely relief from med, stream is good as is.  0-1 nocturia now.

## 2018-03-07 ENCOUNTER — Other Ambulatory Visit: Payer: Self-pay | Admitting: Family Medicine

## 2018-04-01 ENCOUNTER — Other Ambulatory Visit: Payer: Self-pay | Admitting: Family Medicine

## 2018-06-08 DIAGNOSIS — C61 Malignant neoplasm of prostate: Secondary | ICD-10-CM | POA: Diagnosis not present

## 2018-06-08 DIAGNOSIS — N400 Enlarged prostate without lower urinary tract symptoms: Secondary | ICD-10-CM | POA: Diagnosis not present

## 2018-08-11 ENCOUNTER — Ambulatory Visit (INDEPENDENT_AMBULATORY_CARE_PROVIDER_SITE_OTHER): Payer: Medicare Other

## 2018-08-11 DIAGNOSIS — Z23 Encounter for immunization: Secondary | ICD-10-CM | POA: Diagnosis not present

## 2018-09-02 ENCOUNTER — Ambulatory Visit (INDEPENDENT_AMBULATORY_CARE_PROVIDER_SITE_OTHER): Payer: Medicare Other | Admitting: Family Medicine

## 2018-09-02 ENCOUNTER — Encounter: Payer: Self-pay | Admitting: Family Medicine

## 2018-09-02 DIAGNOSIS — M25512 Pain in left shoulder: Secondary | ICD-10-CM | POA: Diagnosis not present

## 2018-09-02 NOTE — Patient Instructions (Signed)
Take 2 advil with food prior to bed.   Use the exercises but limit lifting otherwise.  Take care.  Glad to see you.

## 2018-09-02 NOTE — Progress Notes (Signed)
L shoulder pain.  More pain driving, across the L scapula  Bothersome but not severe.   He was lifting, racking a lot of tires recently (helping a friend).  More pain since then.  He has pain at the L [place] joint, ttp locally.  He had L sided HA and neck pain and L AC joint.    He has some L ear pain, unclear if related to the above.    Pain with overhead movement L shoulder but no arm drop.  Pain sleeping on L side at night.   Meds, vitals, and allergies reviewed.   ROS: Per HPI unless specifically indicated in ROS section   nad ncat B TMs wnl Op wnl  Neck supple, no lymphadenopathy rrr ctab Left [place] joint tender to palpation.  Left AC joint tender on cross load testing. Left shoulder with pain on range of motion with pain on internal and external rotation.  Pain with overhead movement but no arm drop.  Supraspinatus testing positive.  Distally neurovascular intact.

## 2018-09-04 DIAGNOSIS — M25512 Pain in left shoulder: Secondary | ICD-10-CM | POA: Insufficient documentation

## 2018-09-04 HISTORY — DX: Pain in left shoulder: M25.512

## 2018-09-04 NOTE — Assessment & Plan Note (Signed)
He probably unintentionally aggravated his [place] joint, AC joint, and his rotator cuff.  Discussed options. Take 2 advil with food prior to bed.  Routine cautions given. Handout given to patient, discussed and demonstrated exercises for rotator cuff rehabilitation/home exercise program.  He will use the exercises but limit lifting otherwise.  Okay to use ice as needed for 5 minutes a time on the [place] and AC joints.  If is not getting better then I want him to see the sports medicine clinic.  He agrees with plan.

## 2018-10-24 ENCOUNTER — Other Ambulatory Visit: Payer: Self-pay | Admitting: Family Medicine

## 2018-12-01 DIAGNOSIS — C61 Malignant neoplasm of prostate: Secondary | ICD-10-CM | POA: Diagnosis not present

## 2018-12-08 DIAGNOSIS — N401 Enlarged prostate with lower urinary tract symptoms: Secondary | ICD-10-CM | POA: Diagnosis not present

## 2018-12-08 DIAGNOSIS — R3912 Poor urinary stream: Secondary | ICD-10-CM | POA: Diagnosis not present

## 2018-12-08 DIAGNOSIS — N529 Male erectile dysfunction, unspecified: Secondary | ICD-10-CM | POA: Diagnosis not present

## 2018-12-08 DIAGNOSIS — R351 Nocturia: Secondary | ICD-10-CM | POA: Diagnosis not present

## 2018-12-08 DIAGNOSIS — C61 Malignant neoplasm of prostate: Secondary | ICD-10-CM | POA: Diagnosis not present

## 2019-01-04 ENCOUNTER — Other Ambulatory Visit: Payer: Self-pay | Admitting: Family Medicine

## 2019-03-09 ENCOUNTER — Encounter: Payer: Self-pay | Admitting: Family Medicine

## 2019-03-09 ENCOUNTER — Ambulatory Visit (INDEPENDENT_AMBULATORY_CARE_PROVIDER_SITE_OTHER): Payer: Medicare Other | Admitting: Family Medicine

## 2019-03-09 ENCOUNTER — Other Ambulatory Visit: Payer: Self-pay

## 2019-03-09 ENCOUNTER — Ambulatory Visit: Payer: Medicare Other

## 2019-03-09 VITALS — BP 118/72 | HR 83 | Temp 98.3°F | Ht 69.5 in | Wt 214.1 lb

## 2019-03-09 DIAGNOSIS — Z7189 Other specified counseling: Secondary | ICD-10-CM

## 2019-03-09 DIAGNOSIS — C61 Malignant neoplasm of prostate: Secondary | ICD-10-CM

## 2019-03-09 DIAGNOSIS — I10 Essential (primary) hypertension: Secondary | ICD-10-CM | POA: Diagnosis not present

## 2019-03-09 DIAGNOSIS — Z0000 Encounter for general adult medical examination without abnormal findings: Secondary | ICD-10-CM

## 2019-03-09 DIAGNOSIS — M7700 Medial epicondylitis, unspecified elbow: Secondary | ICD-10-CM | POA: Diagnosis not present

## 2019-03-09 DIAGNOSIS — M109 Gout, unspecified: Secondary | ICD-10-CM

## 2019-03-09 DIAGNOSIS — Z951 Presence of aortocoronary bypass graft: Secondary | ICD-10-CM | POA: Diagnosis not present

## 2019-03-09 DIAGNOSIS — I2510 Atherosclerotic heart disease of native coronary artery without angina pectoris: Secondary | ICD-10-CM | POA: Diagnosis not present

## 2019-03-09 DIAGNOSIS — F411 Generalized anxiety disorder: Secondary | ICD-10-CM

## 2019-03-09 DIAGNOSIS — E785 Hyperlipidemia, unspecified: Secondary | ICD-10-CM

## 2019-03-09 DIAGNOSIS — M25569 Pain in unspecified knee: Secondary | ICD-10-CM

## 2019-03-09 DIAGNOSIS — E119 Type 2 diabetes mellitus without complications: Secondary | ICD-10-CM | POA: Diagnosis not present

## 2019-03-09 LAB — MICROALBUMIN / CREATININE URINE RATIO
Creatinine,U: 349.6 mg/dL
Microalb Creat Ratio: 0.5 mg/g (ref 0.0–30.0)
Microalb, Ur: 1.9 mg/dL (ref 0.0–1.9)

## 2019-03-09 LAB — LIPID PANEL
Cholesterol: 135 mg/dL (ref 0–200)
HDL: 43.3 mg/dL (ref >39.00)
LDL Cholesterol: 66 mg/dL (ref 0–99)
NonHDL: 92.15
Total CHOL/HDL Ratio: 3
Triglycerides: 131 mg/dL (ref 0.0–149.0)
VLDL: 26.2 mg/dL (ref 0.0–40.0)

## 2019-03-09 LAB — COMPREHENSIVE METABOLIC PANEL
ALT: 15 U/L (ref 0–53)
AST: 15 U/L (ref 0–37)
Albumin: 4.3 g/dL (ref 3.5–5.2)
Alkaline Phosphatase: 114 U/L (ref 39–117)
BUN: 23 mg/dL (ref 6–23)
CO2: 27 mEq/L (ref 19–32)
Calcium: 9.5 mg/dL (ref 8.4–10.5)
Chloride: 105 mEq/L (ref 96–112)
Creatinine, Ser: 0.95 mg/dL (ref 0.40–1.50)
GFR: 79.03 mL/min (ref >60.00)
Glucose, Bld: 133 mg/dL — ABNORMAL HIGH (ref 70–99)
Potassium: 3.7 mEq/L (ref 3.5–5.1)
Sodium: 140 mEq/L (ref 135–145)
Total Bilirubin: 0.8 mg/dL (ref 0.2–1.2)
Total Protein: 6.8 g/dL (ref 6.0–8.3)

## 2019-03-09 LAB — URIC ACID: Uric Acid, Serum: 4.7 mg/dL (ref 4.0–7.8)

## 2019-03-09 LAB — HEMOGLOBIN A1C: Hgb A1c MFr Bld: 6.7 % — ABNORMAL HIGH (ref 4.6–6.5)

## 2019-03-09 NOTE — Patient Instructions (Addendum)
Go to the lab on the way out.  We'll contact you with your lab report.  We'll call about an appointment with Dr. Tamala Julian.  Check with your insurance to see if they will cover the shingrix shot.   I would get a flu shot each fall.   Ice your knee and elbow and update me as needed.  I'll work on your med refills after I see your labs.  Take care.  Glad to see you.

## 2019-03-09 NOTE — Progress Notes (Signed)
I have personally reviewed the Medicare Annual Wellness questionnaire and have noted 1. The patient's medical and social history 2. Their use of alcohol, tobacco or illicit drugs 3. Their current medications and supplements 4. The patient's functional ability including ADL's, fall risks, home safety risks and hearing or visual             impairment. 5. Diet and physical activities 6. Evidence for depression or mood disorders  The patients weight, height, BMI have been recorded in the chart and visual acuity is per eye clinic.  I have made referrals, counseling and provided education to the patient based review of the above and I have provided the pt with a written personalized care plan for preventive services.  Provider list updated- see scanned forms.  Routine anticipatory guidance given to patient.  See health maintenance. The possibility exists that previously documented standard health maintenance information may have been brought forward from a previous encounter into this note.  If needed, that same information has been updated to reflect the current situation based on today's encounter.    Flu yearly Shingles discussed with patient about coverage. PNA up-to-date. Tetanus 2017 Colon cancer screening done 2018 Prostate cancer screening per urology.  I will defer.  He agrees. Advance directive discussed with patient.  Wife designated if patient were incapacitated. Cognitive function addressed- see scanned forms- and if abnormal then additional documentation follows.   Diabetes:  No meds.  Hypoglycemic episodes: no sx Hyperglycemic episodes: no sx Feet problems: no Blood Sugars averaging: not checked.  eye exam within last year: due, d/w pt.   See notes on labs.  Gout.  No ADE on med.  No flares.  Compliant.  Due for labs.  See notes on labs.  Elevated Cholesterol: Using medications without problems: yes Muscle aches: no Diet compliance: yes Exercise:yes Labs pending.    Hypertension:    Using medication without problems or lightheadedness: yes Chest pain with exertion:no Edema:no Short of breath:no  Prostate cancer per urology.  I'll defer.  He agrees.    Mood d/w pt.  Mood is good. No ADE on med.  Compliant.  No suicidal or homicidal intent.  Right elbow pain.  Medial.  Noted with certain movements.  No trauma.  Episodically symptomatic.  No redness, no bruising.  L knee pain with ROM.  Able to bear weight.  Stiff in the AM but not puffy.  Some discomfort in the AM.  Pain is better now than a few weeks ago.    PMH and SH reviewed  Meds, vitals, and allergies reviewed.   ROS: Per HPI.  Unless specifically indicated otherwise in HPI, the patient denies:  General: fever. Eyes: acute vision changes ENT: sore throat Cardiovascular: chest pain Respiratory: SOB GI: vomiting GU: dysuria Musculoskeletal: acute back pain Derm: acute rash Neuro: acute motor dysfunction Psych: worsening mood Endocrine: polydipsia Heme: bleeding Allergy: hayfever  GEN: nad, alert and oriented HEENT: ncat NECK: supple w/o LA CV: rrr. PULM: ctab, no inc wob ABD: soft, +bs EXT: no edema SKIN: no acute rash  Right elbow with normal range of motion but medial epicondyle tenderness with testing for golfers elbow.  Normal range of motion distally.  No local puffiness or bruising or redness.  L knee with lateral click on ROM, lateral joint line pain.  No locking.  ACL feels solid.    Diabetic foot exam: Normal inspection No skin breakdown No calluses  Normal DP pulses Normal sensation to light touch and monofilament Nails  normal  Health Maintenance  Topic Date Due  . OPHTHALMOLOGY EXAM  12/23/1960  . INFLUENZA VACCINE  03/04/2019  . PNA vac Low Risk Adult (2 of 2 - PPSV23) 03/12/2020 (Originally 03/02/2019)  . HEMOGLOBIN A1C  09/09/2019  . FOOT EXAM  03/08/2020  . URINE MICROALBUMIN  03/08/2020  . TETANUS/TDAP  05/15/2026  . COLONOSCOPY  01/20/2027   . Hepatitis C Screening  Completed

## 2019-03-13 DIAGNOSIS — R2991 Unspecified symptoms and signs involving the musculoskeletal system: Secondary | ICD-10-CM | POA: Insufficient documentation

## 2019-03-13 DIAGNOSIS — M25569 Pain in unspecified knee: Secondary | ICD-10-CM | POA: Insufficient documentation

## 2019-03-13 DIAGNOSIS — M7700 Medial epicondylitis, unspecified elbow: Secondary | ICD-10-CM

## 2019-03-13 HISTORY — DX: Pain in unspecified knee: M25.569

## 2019-03-13 HISTORY — DX: Medial epicondylitis, unspecified elbow: M77.00

## 2019-03-13 MED ORDER — HYDROCHLOROTHIAZIDE 25 MG PO TABS: 25.0000 mg | ORAL_TABLET | Freq: Every day | ORAL | 3 refills | Status: DC

## 2019-03-13 MED ORDER — ALLOPURINOL 300 MG PO TABS: 300.0000 mg | ORAL_TABLET | Freq: Every day | ORAL | 3 refills | Status: DC

## 2019-03-13 MED ORDER — ESCITALOPRAM OXALATE 5 MG PO TABS: 5.0000 mg | ORAL_TABLET | Freq: Every day | ORAL | 3 refills | Status: DC

## 2019-03-13 MED ORDER — ATORVASTATIN CALCIUM 40 MG PO TABS: 40.0000 mg | ORAL_TABLET | Freq: Every day | ORAL | 3 refills | Status: DC

## 2019-03-13 MED ORDER — TAMSULOSIN HCL 0.4 MG PO CAPS: 0.4000 mg | ORAL_CAPSULE | Freq: Two times a day (BID) | ORAL | 3 refills | Status: DC

## 2019-03-13 NOTE — Assessment & Plan Note (Signed)
Mood is good. No ADE on med.  Compliant.  No suicidal or homicidal intent.  Continue as is.  He agrees.

## 2019-03-13 NOTE — Assessment & Plan Note (Signed)
No change in meds at this point.  See notes on labs.  Continue work on diet and exercise.

## 2019-03-13 NOTE — Assessment & Plan Note (Signed)
Flu yearly Shingles discussed with patient about coverage. PNA up-to-date. Tetanus 2017 Colon cancer screening done 2018 Prostate cancer screening per urology.  I will defer.  He agrees. Advance directive discussed with patient.  Wife designated if patient were incapacitated. Cognitive function addressed- see scanned forms- and if abnormal then additional documentation follows.

## 2019-03-13 NOTE — Assessment & Plan Note (Signed)
Reasonable to have patient follow-up with cardiology given his history, but not because he is having any new symptoms.  Discussed.  He agrees.

## 2019-03-13 NOTE — Assessment & Plan Note (Signed)
Likely with arthritic changes and he could have some meniscal irritation given that he has a lateral click with range of motion.  He is some better than it was a few weeks ago so it is reasonable to ice as needed and update me as needed.  He agrees.

## 2019-03-13 NOTE — Assessment & Plan Note (Signed)
No need for imaging at this point.  He can try brace and also ice as needed.  Update me as needed.  He agrees.  Anatomy discussed with patient.

## 2019-03-13 NOTE — Assessment & Plan Note (Signed)
No ADE on med.  No flares.  Compliant.  Due for labs.  See notes on labs.

## 2019-03-13 NOTE — Assessment & Plan Note (Signed)
No meds currently.  Discussed diet and exercise.  See notes on labs.

## 2019-03-13 NOTE — Assessment & Plan Note (Signed)
Prostate cancer per urology.  I'll defer.  He agrees.

## 2019-03-13 NOTE — Assessment & Plan Note (Signed)
Advance directive discussed with patient.  Wife designated if patient were incapacitated. 

## 2019-04-25 ENCOUNTER — Ambulatory Visit (INDEPENDENT_AMBULATORY_CARE_PROVIDER_SITE_OTHER): Payer: Medicare Other

## 2019-04-25 DIAGNOSIS — Z23 Encounter for immunization: Secondary | ICD-10-CM

## 2019-05-31 NOTE — Progress Notes (Signed)
Cardiology Office Note:    Date:  06/01/2019   ID:  Tyler Fess., DOB 10-23-1950, MRN [id number]  PCP:  Tonia Ghent, MD  Cardiologist:  No primary care provider on file.   Referring MD: Tonia Ghent, MD   Chief Complaint  Patient presents with  . Coronary Artery Disease    History of Present Illness:    Tyler Guerra. is a 68 y.o. male with a hx of CABG 2000 (LIMA--> LAD; SVG RCA, and Free radial to OM), hypertension, DM II, Hyperlipidemia, hypertension, and h/o TIA.  He is doing okay.  Dr. [name] asked that he come since he is approximately 20 years post coronary bypass surgery.  He has not had chest discomfort, burning, or limiting shortness of breath.  If he rushes he does experience some shortness of breath over a significant duration of activity.  There is no lower extremity swelling.  He has not had syncope.  No significant palpitations.  Past Medical History:  Diagnosis Date  . Anxiety state 03/16/2016  . Aphasia 01/26/2017  . Benign localized prostatic hyperplasia with lower urinary tract symptoms (LUTS)   . BPV (benign positional vertigo) 11/02/2014  . Carotid artery stenosis    PER DUPLEX 10-22-2015  BILATERAL ICA 1-39%  . Coronary artery disease    CARDIOLOGIST-  DR Daneen Schick--  VISIT EVERY OTHER YEAR-- [place] 2015  . Coronary atherosclerosis 07/19/2010   Qualifier: Diagnosis of  By: [name] MD, Phillip Heal    . Depression 01/26/2017  . Diabetes mellitus without complication ([place]) 0000000  . GERD 07/19/2010   Qualifier: Diagnosis of  By: [name] MD, Phillip Heal    . GERD (gastroesophageal reflux disease)   . 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 Gout   . Gout 07/18/2010   Qualifier: Diagnosis of  By: [name] MD, Phillip Heal    . H/O eye injury    chronic changes to left eye after injury  . Hand weakness 03/02/2018  . Hearing loss 04/21/2013  . History of TIA (transient ischemic attack)    03/ 2017  no residual after brief episode loss peripheral vision  . Hyperglycemia  05/17/2016  . Hyperlipemia   . Hyperlipidemia 07/18/2010   Qualifier: Diagnosis of  By: [name] MD, Phillip Heal    . Hypertension   . HYPERTENSION, BENIGN ESSENTIAL 07/18/2010   Qualifier: Diagnosis of  By: [name] MD, Phillip Heal    . Hypokalemia 01/26/2017  . Knee pain 03/13/2019  . Left shoulder pain 09/04/2018  . Prostate cancer ([place]) UROLOGIST-  DR GRAPEY/  ONCOLOGIST-  DR MANNING   dx 2015--- Stage T1c, Gleason 3+4, PSA 4.03, vol 44cc  . PVC's (premature ventricular contractions)   . Rash and nonspecific skin eruption 10/14/2015  . 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 Skin lesion 10/14/2015  . TIA (transient ischemic attack) 10/14/2015  . Ventral hernia 10/26/2011  . Wears glasses     Past Surgical History:  Procedure Laterality Date  . APPENDECTOMY  1975  . CARDIAC CATHETERIZATION  12-22-2001   dr Daneen Schick   severe native vessel disease mLAD 60-70%,  total occulsion pCFX  and pRCA/  widely patent saphenous vein , free radial , and LIMA grafts/  minminal lv dysfunction, ef 60%  . CORONARY ARTERY BYPASS GRAFT  May 2000   LIMA to LAD,  SVG to PDA and Diagonal, Free radial graft to OM  . Exericse treadmill test  last  one 01-03-2014  dr Daneen Schick   normal exercise tolerance w/ hypertensive repsonse,  no ischemic EKG changes, appropriate HR response & recovery ([facility] TM score 9;  Low Risk , PVC's w/ exertion)  . PROSTATE BIOPSY    . RADIOACTIVE SEED IMPLANT N/A 01/13/2016   Procedure: RADIOACTIVE SEED IMPLANT/BRACHYTHERAPY IMPLANT;  Surgeon: Rana Snare, MD;  Location[facility];  Service: Urology;  Laterality: N/A;    Current Medications: Current Meds  Medication Sig  . allopurinol (ZYLOPRIM) 300 MG tablet Take 1 tablet (300 mg total) by mouth daily.  [name] aspirin 325 MG tablet Take 1 tablet (325 mg total) by mouth daily.  [name] atorvastatin (LIPITOR) 40 MG tablet Take 1 tablet (40 mg total) by mouth daily.  . Cholecalciferol  (VITAMIN D PO) Take 1 capsule by mouth 2 (two) times daily.   [name] escitalopram (LEXAPRO) 5 MG tablet Take 1 tablet (5 mg total) by mouth daily.  [name] esomeprazole (NEXIUM) 20 MG capsule Take 20 mg by mouth daily at 12 noon.  . hydrochlorothiazide (HYDRODIURIL) 25 MG tablet Take 1 tablet (25 mg total) by mouth daily.  . Misc Natural Products (BLACK CHERRY CONCENTRATE PO) Take by mouth at bedtime.  . Multiple Vitamins-Minerals (OCUVITE PO) Take 1 capsule by mouth daily.  . sodium chloride (OCEAN) 0.65 % SOLN nasal spray Place 1 spray into both nostrils as needed for congestion.  . tamsulosin (FLOMAX) 0.4 MG CAPS capsule Take 1 capsule (0.4 mg total) by mouth 2 (two) times daily.     Allergies:   Patient has no known allergies.   Social History   Socioeconomic History  . Marital status: Married    Spouse name: Not on file  . Number of children: 2  . Years of education: Not on file  . Highest education level: Not on file  Occupational History  . 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 Financial resource strain: Not on file  . Food insecurity    Worry: Not on file    Inability: Not on file  . Transportation needs    Medical: Not on file    Non-medical: Not on file  Tobacco Use  . Smoking status: Never Smoker  . Smokeless tobacco: Never Used  Substance and Sexual Activity  . Alcohol use: No  . Drug use: No  . Sexual activity: Yes  Lifestyle  . Physical activity    Days per week: Not on file    Minutes per session: Not on file  . Stress: Not on file  Relationships  . Social Herbalist on phone: Not on file    Gets together: Not on file    Attends religious service: Not on file    Active member of club or organization: Not on file    Attends meetings of clubs or organizations: Not on file    Relationship status: Not on file  Other Topics Concern  . Not on file  Social History  Narrative   Married 1974   2 daughters and 2 grandkids   Retired 2018.       Family History: The patient's family history includes Cancer Guerra his father, paternal uncle, and sister; Dementia Guerra his mother; Heart disease Guerra his father; Prostate cancer Guerra his father; Stroke Guerra his father. There is no history of Colon cancer.  ROS:   Please see the history of  present illness.    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  Difficulty with his prostate.  Is on Escitalopram because of difficulty sleeping and emotions.  All other systems reviewed and are negative.  EKGs/Labs/Other Studies Reviewed:    The following studies were reviewed today: 2D Doppler echocardiogram 2018:  Study Conclusions  - Left ventricle: The cavity size was normal. There was mild   concentric hypertrophy. Systolic function was normal. The   estimated ejection fraction was Guerra the range of 55% to 60%. Wall   motion was normal; there were no regional wall motion   abnormalities. - Aortic valve: Trileaflet; normal thickness, mildly calcified   leaflets. Valve area (VTI): 2.14 cm^2. Valve area (Vmax): 1.96   cm^2. Valve area (Vmean): 2.18 cm^2.  Impressions:  - No cardiac source of emboli was indentified.    EKG:  EKG sinus tachycardia, 100 bpm, inferior Q waves, otherwise unremarkable.  When compared to the EKG from June 2018, inferior Q waves are new and heart rate is faster.  Recent Labs: 03/09/2019: ALT 15; BUN 23; Creatinine, Ser 0.95; Potassium 3.7; Sodium 140  Recent Lipid Panel    Component Value Date/Time   CHOL 135 03/09/2019 1259   TRIG 131.0 03/09/2019 1259   HDL 43.30 03/09/2019 1259   CHOLHDL 3 03/09/2019 1259   VLDL 26.2 03/09/2019 1259   LDLCALC 66 03/09/2019 1259   LDLDIRECT 75 08/06/2010 1536    Physical Exam:    VS:  BP 116/80   Pulse 100   Ht 5' 9.5" (1.765 m)   Wt 217 lb 12.8 oz (98.8 kg)   SpO2 97%   BMI 31.70 kg/m     Wt Readings from Last 3 Encounters:  06/01/19 217 lb 12.8 oz (98.8 kg)   03/09/19 214 lb 1 oz (97.1 kg)  03/01/18 209 lb 4 oz (94.9 kg)     GEN: Mild obesity. No acute distress HEENT: Normal NECK: No JVD. LYMPHATICS: No lymphadenopathy CARDIAC:  RRR with 1/6 to 2/6 systolic murmur.  No gallop, or edema. VASCULAR:  Normal Pulses. No bruits. RESPIRATORY:  Clear to auscultation without rales, wheezing or rhonchi  ABDOMEN: Soft, non-tender, non-distended, No pulsatile mass, MUSCULOSKELETAL: No deformity  SKIN: Warm and dry NEUROLOGIC:  Alert and oriented x 3 PSYCHIATRIC:  Normal affect   ASSESSMENT:    1. S/P CABG (coronary artery bypass graft)   2. HYPERTENSION, BENIGN ESSENTIAL   3. Hyperlipidemia, unspecified hyperlipidemia type   4. Diabetes mellitus without complication ([place])   5. TIA (transient ischemic attack)   6. Educated about COVID-19 virus infection    PLAN:    Guerra order of problems listed above:  1. Remote coronary bypass surgery as outlined above.  Exertional dyspnea with moderate activity.  Exercise treadmill test will be done to rule out ischemia with dyspnea as an anginal equivalent. 2. Excellent blood pressure control. 3. LDL target less than 70.  Most recently 40. 4. Hemoglobin A1c was 6.7.  He is attempting diet control. 5. He does not recall this problem. 6. Social distancing, mask wearing, and handwashing is being practiced.  Overall education and awareness concerning primary/secondary risk prevention was discussed Guerra detail: LDL less than 70, hemoglobin A1c less than 7, blood pressure target less than 130/80 mmHg, >150 minutes of moderate aerobic activity per week, avoidance of smoking, weight control (via diet and exercise), and continued surveillance/management of/for obstructive sleep apnea.    Medication Adjustments/Labs and Tests Ordered: Current medicines are reviewed at length with the patient today.  Concerns regarding medicines are outlined above.  No orders of the defined types were placed Guerra this encounter.  No  orders of the defined types were placed Guerra this encounter.   There are no Patient Instructions on file for this visit.   Signed, Sinclair Grooms, MD  06/01/2019 2:16 PM    [facility]

## 2019-06-01 ENCOUNTER — Ambulatory Visit (INDEPENDENT_AMBULATORY_CARE_PROVIDER_SITE_OTHER): Payer: Medicare Other | Admitting: Interventional Cardiology

## 2019-06-01 ENCOUNTER — Encounter: Payer: Self-pay | Admitting: Interventional Cardiology

## 2019-06-01 ENCOUNTER — Encounter: Payer: Self-pay | Admitting: *Deleted

## 2019-06-01 ENCOUNTER — Other Ambulatory Visit: Payer: Self-pay

## 2019-06-01 VITALS — BP 116/80 | HR 100 | Ht 69.5 in | Wt 217.8 lb

## 2019-06-01 DIAGNOSIS — E785 Hyperlipidemia, unspecified: Secondary | ICD-10-CM

## 2019-06-01 DIAGNOSIS — I10 Essential (primary) hypertension: Secondary | ICD-10-CM

## 2019-06-01 DIAGNOSIS — Z7189 Other specified counseling: Secondary | ICD-10-CM | POA: Diagnosis not present

## 2019-06-01 DIAGNOSIS — E119 Type 2 diabetes mellitus without complications: Secondary | ICD-10-CM

## 2019-06-01 DIAGNOSIS — Z951 Presence of aortocoronary bypass graft: Secondary | ICD-10-CM | POA: Diagnosis not present

## 2019-06-01 DIAGNOSIS — G459 Transient cerebral ischemic attack, unspecified: Secondary | ICD-10-CM | POA: Diagnosis not present

## 2019-06-01 MED ORDER — ASPIRIN EC 81 MG PO TBEC: 81.0000 mg | DELAYED_RELEASE_TABLET | Freq: Every day | ORAL | 3 refills | Status: AC

## 2019-06-01 NOTE — Patient Instructions (Signed)
Medication Instructions:  1) DECREASE Aspirin to 81mg  once daily.  *If you need a refill on your cardiac medications before your next appointment, please call your pharmacy*  Lab Work: None If you have labs (blood work) drawn today and your tests are completely normal, you will receive your results only by: [name] MyChart Message (if you have MyChart) OR . A paper copy in the mail If you have any lab test that is abnormal or we need to change your treatment, we will call you to review the results.  Testing/Procedures: Your physician has requested that you have an exercise tolerance test. For further information please visit [email or web address]. Please also follow instruction sheet, as given.   Follow-Up: At [facility], you and your health needs are our priority.  As part of our continuing mission to provide you with exceptional heart care, we have created designated Provider Care Teams.  These Care Teams include your primary Cardiologist (physician) and Advanced Practice Providers (APPs -  Physician Assistants and Nurse Practitioners) who all work together to provide you with the care you need, when you need it.  Your next appointment:   12 months  The format for your next appointment:   In Person  Provider:   You may see Dr. Daneen Schick or one of the following Advanced Practice Providers on your designated Care Team:    Truitt Merle, NP  Cecilie Kicks, NP  Kathyrn Drown, NP   Other Instructions

## 2019-06-16 ENCOUNTER — Telehealth (HOSPITAL_COMMUNITY): Payer: Self-pay

## 2019-06-16 NOTE — Telephone Encounter (Signed)
Encounter complete. 

## 2019-06-17 ENCOUNTER — Other Ambulatory Visit (HOSPITAL_COMMUNITY)
Admission: RE | Admit: 2019-06-17 | Discharge: 2019-06-17 | Disposition: A | Discharge status: Home / Self Care | Payer: Medicare Other | Source: Ambulatory Visit | Attending: Interventional Cardiology | Admitting: Interventional Cardiology

## 2019-06-17 DIAGNOSIS — Z20828 Contact with and (suspected) exposure to other viral communicable diseases: Secondary | ICD-10-CM | POA: Diagnosis not present

## 2019-06-17 DIAGNOSIS — Z01812 Encounter for preprocedural laboratory examination: Secondary | ICD-10-CM | POA: Insufficient documentation

## 2019-06-18 LAB — NOVEL CORONAVIRUS, NAA (HOSP ORDER, SEND-OUT TO REF LAB; TAT 18-24 HRS): SARS-CoV-2, NAA: NOT DETECTED

## 2019-06-21 ENCOUNTER — Ambulatory Visit (HOSPITAL_COMMUNITY)
Admission: RE | Admit: 2019-06-21 | Discharge: 2019-06-21 | Disposition: A | Discharge status: Home / Self Care | Payer: Medicare Other | Source: Ambulatory Visit | Attending: Cardiology | Admitting: Cardiology

## 2019-06-21 ENCOUNTER — Other Ambulatory Visit: Payer: Self-pay

## 2019-06-21 DIAGNOSIS — Z951 Presence of aortocoronary bypass graft: Secondary | ICD-10-CM | POA: Diagnosis not present

## 2019-06-21 DIAGNOSIS — I10 Essential (primary) hypertension: Secondary | ICD-10-CM | POA: Diagnosis not present

## 2019-06-21 DIAGNOSIS — G459 Transient cerebral ischemic attack, unspecified: Secondary | ICD-10-CM | POA: Diagnosis not present

## 2019-06-21 DIAGNOSIS — E119 Type 2 diabetes mellitus without complications: Secondary | ICD-10-CM | POA: Diagnosis not present

## 2019-06-21 DIAGNOSIS — E785 Hyperlipidemia, unspecified: Secondary | ICD-10-CM | POA: Diagnosis not present

## 2019-06-21 LAB — EXERCISE TOLERANCE TEST
Estimated workload: 10.1 METS
Exercise duration (min): 8 min
Exercise duration (sec): 4 s
MPHR: 153 {beats}/min
Peak HR: 130 {beats}/min
Percent HR: 84 %
RPE: 17
Rest HR: 93 {beats}/min

## 2019-06-27 ENCOUNTER — Other Ambulatory Visit: Payer: Self-pay

## 2019-09-25 ENCOUNTER — Ambulatory Visit: Payer: Medicare Other | Attending: Internal Medicine

## 2019-09-25 DIAGNOSIS — Z23 Encounter for immunization: Secondary | ICD-10-CM | POA: Insufficient documentation

## 2019-09-25 NOTE — Progress Notes (Signed)
   [id number]  Name:  Tyler Guerra.    MRN: [id number] DOB: Feb 13, 1951  09/25/2019  [name] was observed post Covid-19 immunization for 15 minutes without incidence. He was provided with Vaccine Information Sheet and instruction to access the V-Safe system.   [name] was instructed to call 911 with any severe reactions post vaccine: [name] Difficulty breathing  . Swelling of your face and throat  . A fast heartbeat  . A bad rash all over your body  . Dizziness and weakness    Immunizations Administered    Name Date Dose VIS Date Route   Pfizer COVID-19 Vaccine 09/25/2019 11:16 AM 0.3 mL 07/14/2019 Intramuscular   Manufacturer: [place]   Lot: [id number]   [place]: [id number]

## 2019-10-10 ENCOUNTER — Ambulatory Visit (INDEPENDENT_AMBULATORY_CARE_PROVIDER_SITE_OTHER): Payer: Medicare Other | Admitting: Family Medicine

## 2019-10-10 ENCOUNTER — Encounter: Payer: Self-pay | Admitting: Family Medicine

## 2019-10-10 ENCOUNTER — Other Ambulatory Visit: Payer: Self-pay

## 2019-10-10 ENCOUNTER — Ambulatory Visit (INDEPENDENT_AMBULATORY_CARE_PROVIDER_SITE_OTHER)
Admission: RE | Admit: 2019-10-10 | Discharge: 2019-10-10 | Disposition: A | Discharge status: Home / Self Care | Payer: Medicare Other | Source: Ambulatory Visit | Attending: Family Medicine | Admitting: Family Medicine

## 2019-10-10 VITALS — BP 122/70 | HR 95 | Temp 97.6°F | Ht 69.5 in | Wt 215.2 lb

## 2019-10-10 DIAGNOSIS — R0789 Other chest pain: Secondary | ICD-10-CM

## 2019-10-10 DIAGNOSIS — S299XXA Unspecified injury of thorax, initial encounter: Secondary | ICD-10-CM | POA: Diagnosis not present

## 2019-10-10 MED ORDER — HYDROCODONE-ACETAMINOPHEN 5-325 MG PO TABS: 0.5000 | ORAL_TABLET | Freq: Three times a day (TID) | ORAL | 0 refills | Status: DC | PRN

## 2019-10-10 NOTE — Patient Instructions (Addendum)
Don't take extra tylenol with the vicodin.  Sedation caution.  Okay to use ibuprofen with food if needed.  Go to the lab on the way out.   If you have mychart we'll likely use that to update you.   Take care.  Glad to see you.  Reasonable to get follow up for diabetes when possible this spring.

## 2019-10-10 NOTE — Progress Notes (Signed)
This visit occurred during the SARS-CoV-2 public health emergency.  Safety protocols were in place, including screening questions prior to the visit, additional usage of staff PPE, and extensive cleaning of exam room while observing appropriate contact time as indicated for disinfecting solutions.  One of his dogs recently died.  Condolences offered.  D/w pt.    He was going to get his ipad and he accidentally stepped on one his dogs, tried not to put pressure down, but then stumbled into the corner of a storage chest.  He hit the corner with the R upper chest, under the R arm.  This was 10/08/19.  No LOC.  Scraped R hand on R 5th finger.  Wife helped him get up at the time.    Still with pain, he has to roll out of bed.  Sore locally.  Lifting with R arm causes pain under the R axilla.   Meds, vitals, and allergies reviewed.   ROS: Per HPI unless specifically indicated in ROS section   GEN: nad, alert and oriented, uncomfortable but speaking in complete sentences. HEENT:ncat NECK: supple w/o LA CV: rrr PULM: ctab, no inc wob, no focal decrease in breath sounds on either side of the chest. ABD: soft, +bs He has a small bruise on the right upper anterior chest wall.  He is tender locally on the right side of the upper chest.

## 2019-10-11 ENCOUNTER — Emergency Department (HOSPITAL_COMMUNITY): Payer: Medicare Other

## 2019-10-11 ENCOUNTER — Other Ambulatory Visit: Payer: Self-pay

## 2019-10-11 ENCOUNTER — Encounter (HOSPITAL_COMMUNITY): Payer: Self-pay | Admitting: Emergency Medicine

## 2019-10-11 ENCOUNTER — Telehealth: Payer: Self-pay | Admitting: Family Medicine

## 2019-10-11 ENCOUNTER — Inpatient Hospital Stay (HOSPITAL_COMMUNITY)
Admission: EM | Admit: 2019-10-11 | Discharge: 2019-10-13 | DRG: 201 | Disposition: A | Discharge status: Home / Self Care | Payer: Medicare Other | Attending: General Surgery | Admitting: General Surgery

## 2019-10-11 DIAGNOSIS — J939 Pneumothorax, unspecified: Secondary | ICD-10-CM | POA: Diagnosis not present

## 2019-10-11 DIAGNOSIS — Z8249 Family history of ischemic heart disease and other diseases of the circulatory system: Secondary | ICD-10-CM | POA: Diagnosis not present

## 2019-10-11 DIAGNOSIS — F411 Generalized anxiety disorder: Secondary | ICD-10-CM | POA: Diagnosis present

## 2019-10-11 DIAGNOSIS — I2510 Atherosclerotic heart disease of native coronary artery without angina pectoris: Secondary | ICD-10-CM | POA: Diagnosis present

## 2019-10-11 DIAGNOSIS — Z8042 Family history of malignant neoplasm of prostate: Secondary | ICD-10-CM | POA: Diagnosis not present

## 2019-10-11 DIAGNOSIS — Z823 Family history of stroke: Secondary | ICD-10-CM | POA: Diagnosis not present

## 2019-10-11 DIAGNOSIS — Z79899 Other long term (current) drug therapy: Secondary | ICD-10-CM

## 2019-10-11 DIAGNOSIS — E785 Hyperlipidemia, unspecified: Secondary | ICD-10-CM | POA: Diagnosis present

## 2019-10-11 DIAGNOSIS — K219 Gastro-esophageal reflux disease without esophagitis: Secondary | ICD-10-CM | POA: Diagnosis present

## 2019-10-11 DIAGNOSIS — I10 Essential (primary) hypertension: Secondary | ICD-10-CM | POA: Diagnosis present

## 2019-10-11 DIAGNOSIS — R0789 Other chest pain: Secondary | ICD-10-CM | POA: Insufficient documentation

## 2019-10-11 DIAGNOSIS — Z803 Family history of malignant neoplasm of breast: Secondary | ICD-10-CM

## 2019-10-11 DIAGNOSIS — H9190 Unspecified hearing loss, unspecified ear: Secondary | ICD-10-CM | POA: Diagnosis present

## 2019-10-11 DIAGNOSIS — M109 Gout, unspecified: Secondary | ICD-10-CM | POA: Diagnosis present

## 2019-10-11 DIAGNOSIS — Z7982 Long term (current) use of aspirin: Secondary | ICD-10-CM | POA: Diagnosis not present

## 2019-10-11 DIAGNOSIS — Z8673 Personal history of transient ischemic attack (TIA), and cerebral infarction without residual deficits: Secondary | ICD-10-CM | POA: Diagnosis not present

## 2019-10-11 DIAGNOSIS — Z4682 Encounter for fitting and adjustment of non-vascular catheter: Secondary | ICD-10-CM | POA: Diagnosis not present

## 2019-10-11 DIAGNOSIS — Z20822 Contact with and (suspected) exposure to covid-19: Secondary | ICD-10-CM | POA: Diagnosis present

## 2019-10-11 DIAGNOSIS — Z9689 Presence of other specified functional implants: Secondary | ICD-10-CM

## 2019-10-11 DIAGNOSIS — W01118A Fall on same level from slipping, tripping and stumbling with subsequent striking against other sharp object, initial encounter: Secondary | ICD-10-CM | POA: Diagnosis present

## 2019-10-11 DIAGNOSIS — F329 Major depressive disorder, single episode, unspecified: Secondary | ICD-10-CM | POA: Diagnosis present

## 2019-10-11 DIAGNOSIS — Z8546 Personal history of malignant neoplasm of prostate: Secondary | ICD-10-CM | POA: Diagnosis not present

## 2019-10-11 DIAGNOSIS — E119 Type 2 diabetes mellitus without complications: Secondary | ICD-10-CM | POA: Diagnosis present

## 2019-10-11 DIAGNOSIS — S270XXA Traumatic pneumothorax, initial encounter: Secondary | ICD-10-CM | POA: Diagnosis not present

## 2019-10-11 DIAGNOSIS — Z951 Presence of aortocoronary bypass graft: Secondary | ICD-10-CM

## 2019-10-11 HISTORY — DX: Pneumothorax, unspecified: J93.9

## 2019-10-11 HISTORY — DX: Unspecified hearing loss, unspecified ear: H91.90

## 2019-10-11 HISTORY — PX: CHEST TUBE INSERTION: SHX231

## 2019-10-11 LAB — BASIC METABOLIC PANEL
Anion gap: 10 (ref 5–15)
BUN: 14 mg/dL (ref 8–23)
CO2: 26 mmol/L (ref 22–32)
Calcium: 9.3 mg/dL (ref 8.9–10.3)
Chloride: 102 mmol/L (ref 98–111)
Creatinine, Ser: 1.14 mg/dL (ref 0.61–1.24)
GFR calc Af Amer: >60 mL/min (ref >60)
GFR calc non Af Amer: >60 mL/min (ref >60)
Glucose, Bld: 154 mg/dL — ABNORMAL HIGH (ref 70–99)
Potassium: 3.9 mmol/L (ref 3.5–5.1)
Sodium: 138 mmol/L (ref 135–145)

## 2019-10-11 LAB — PROTIME-INR
INR: 1.1 (ref 0.8–1.2)
Prothrombin Time: 13.8 seconds (ref 11.4–15.2)

## 2019-10-11 LAB — CBC
HCT: 42.7 % (ref 39.0–52.0)
Hemoglobin: 13.9 g/dL (ref 13.0–17.0)
MCH: 29.9 pg (ref 26.0–34.0)
MCHC: 32.6 g/dL (ref 30.0–36.0)
MCV: 91.8 fL (ref 80.0–100.0)
Platelets: 157 10*3/uL (ref 150–400)
RBC: 4.65 MIL/uL (ref 4.22–5.81)
RDW: 13.1 % (ref 11.5–15.5)
WBC: 5 10*3/uL (ref 4.0–10.5)
nRBC: 0 % (ref 0.0–0.2)

## 2019-10-11 LAB — ABO/RH: ABO/RH(D): O POS

## 2019-10-11 LAB — TYPE AND SCREEN
ABO/RH(D): O POS
Antibody Screen: NEGATIVE

## 2019-10-11 LAB — RESPIRATORY PANEL BY RT PCR (FLU A&B, COVID)
Influenza A by PCR: NEGATIVE
Influenza B by PCR: NEGATIVE
SARS Coronavirus 2 by RT PCR: NEGATIVE

## 2019-10-11 LAB — HIV ANTIBODY (ROUTINE TESTING W REFLEX): HIV Screen 4th Generation wRfx: NONREACTIVE

## 2019-10-11 MED ORDER — ENOXAPARIN SODIUM 40 MG/0.4ML ~~LOC~~ SOLN
40.0000 mg | SUBCUTANEOUS | Status: DC
  Administered 2019-10-11 – 2019-10-12 (×2): 40 mg via SUBCUTANEOUS
  Filled 2019-10-11 (×2): qty 0.4

## 2019-10-11 MED ORDER — PANTOPRAZOLE SODIUM 40 MG IV SOLR
40.0000 mg | Freq: Every day | INTRAVENOUS | Status: DC
  Filled 2019-10-11: qty 40

## 2019-10-11 MED ORDER — DOCUSATE SODIUM 100 MG PO CAPS
100.0000 mg | ORAL_CAPSULE | Freq: Two times a day (BID) | ORAL | Status: DC
  Administered 2019-10-11 – 2019-10-13 (×5): 100 mg via ORAL
  Filled 2019-10-11 (×5): qty 1

## 2019-10-11 MED ORDER — LIDOCAINE HCL 1 % IJ SOLN: 10.0000 mL | Freq: Once | INTRAMUSCULAR | Status: DC

## 2019-10-11 MED ORDER — ACETAMINOPHEN 325 MG PO TABS: 650.0000 mg | ORAL_TABLET | ORAL | Status: DC | PRN

## 2019-10-11 MED ORDER — TAMSULOSIN HCL 0.4 MG PO CAPS
0.4000 mg | ORAL_CAPSULE | Freq: Two times a day (BID) | ORAL | Status: DC
  Administered 2019-10-11 – 2019-10-13 (×5): 0.4 mg via ORAL
  Filled 2019-10-11 (×6): qty 1

## 2019-10-11 MED ORDER — ONDANSETRON 4 MG PO TBDP: 4.0000 mg | ORAL_TABLET | Freq: Four times a day (QID) | ORAL | Status: DC | PRN

## 2019-10-11 MED ORDER — ALLOPURINOL 300 MG PO TABS
300.0000 mg | ORAL_TABLET | Freq: Every day | ORAL | Status: DC
  Administered 2019-10-11 – 2019-10-13 (×3): 300 mg via ORAL
  Filled 2019-10-11 (×4): qty 1

## 2019-10-11 MED ORDER — METOPROLOL TARTRATE 5 MG/5ML IV SOLN: 5.0000 mg | Freq: Four times a day (QID) | INTRAVENOUS | Status: DC | PRN

## 2019-10-11 MED ORDER — SODIUM CHLORIDE 0.9 % IV SOLN: INTRAVENOUS | Status: DC

## 2019-10-11 MED ORDER — OXYCODONE HCL 5 MG PO TABS
5.0000 mg | ORAL_TABLET | ORAL | Status: DC | PRN
  Administered 2019-10-11 – 2019-10-12 (×3): 5 mg via ORAL
  Filled 2019-10-11 (×3): qty 1

## 2019-10-11 MED ORDER — HYDROCHLOROTHIAZIDE 25 MG PO TABS
25.0000 mg | ORAL_TABLET | Freq: Every day | ORAL | Status: DC
  Administered 2019-10-11 – 2019-10-13 (×3): 25 mg via ORAL
  Filled 2019-10-11 (×4): qty 1

## 2019-10-11 MED ORDER — PANTOPRAZOLE SODIUM 40 MG PO TBEC
40.0000 mg | DELAYED_RELEASE_TABLET | Freq: Every day | ORAL | Status: DC
  Administered 2019-10-11 – 2019-10-13 (×3): 40 mg via ORAL
  Filled 2019-10-11 (×4): qty 1

## 2019-10-11 MED ORDER — MORPHINE SULFATE (PF) 2 MG/ML IV SOLN
2.0000 mg | INTRAVENOUS | Status: DC | PRN
  Administered 2019-10-11: 13:00:00 2 mg via INTRAVENOUS
  Filled 2019-10-11: qty 1

## 2019-10-11 MED ORDER — ESCITALOPRAM OXALATE 10 MG PO TABS
5.0000 mg | ORAL_TABLET | Freq: Every day | ORAL | Status: DC
  Administered 2019-10-11 – 2019-10-13 (×3): 5 mg via ORAL
  Filled 2019-10-11 (×3): qty 1

## 2019-10-11 MED ORDER — ATORVASTATIN CALCIUM 40 MG PO TABS
40.0000 mg | ORAL_TABLET | Freq: Every day | ORAL | Status: DC
  Administered 2019-10-11 – 2019-10-13 (×3): 40 mg via ORAL
  Filled 2019-10-11 (×3): qty 1

## 2019-10-11 MED ORDER — ONDANSETRON HCL 4 MG/2ML IJ SOLN
4.0000 mg | Freq: Four times a day (QID) | INTRAMUSCULAR | Status: DC | PRN
  Administered 2019-10-11: 13:00:00 4 mg via INTRAVENOUS
  Filled 2019-10-11: qty 2

## 2019-10-11 NOTE — ED Triage Notes (Signed)
Pt tripped over dog on Sunday and fell into a hutch.  C/o R sided rib pain.  Went to PCP office yesterday and received results today that he has a moderate pneumothorax.

## 2019-10-11 NOTE — Telephone Encounter (Signed)
Thank you for taking care of this.  I appreciate the help of all involved.

## 2019-10-11 NOTE — ED Provider Notes (Signed)
[facility] EMERGENCY DEPARTMENT Provider Note   CSN: [id number] Arrival date & time: 10/11/19  1001     History Chief Complaint  Patient presents with  . Chest Injury    Tyler Guerra. is a 69 y.o. male.  HPI   This patient is a 69 year old male, known history of coronary disease status post bypass grafting approximately 18 years ago.  Currently taking aspirin.  He presents to the hospital by private vehicle 24 hours after having a chest x-ray done because of right-sided chest pain after a fall.  The fall occurred on Sunday approximately 5 days ago, he lost his balance and struck his right upper chest wall anteriorly against the sharp edge of a metal edge of a storage trunk.  Since that time he has had some pain with breathing especially posterior thorax.  He states he has some shortness of breath, he has to sleep left side down, he denies any other injuries.  The chest x-ray was read as a possible pneumothorax and thus he was sent to the emergency department today.  He is still mild to moderate shortness of breath.  No other injuries  Past Medical History:  Diagnosis Date  . Anxiety state 03/16/2016  . Aphasia 01/26/2017  . Benign localized prostatic hyperplasia with lower urinary tract symptoms (LUTS)   . BPV (benign positional vertigo) 11/02/2014  . Carotid artery stenosis    PER DUPLEX 10-22-2015  BILATERAL ICA 1-39%  . Coronary artery disease    CARDIOLOGIST-  DR Daneen Schick--  VISIT EVERY OTHER YEAR-- [place] 2015  . Coronary atherosclerosis 07/19/2010   Qualifier: Diagnosis of  By: [name] MD, Phillip Heal    . Depression 01/26/2017  . Diabetes mellitus without complication ([place]) 0000000  . GERD 07/19/2010   Qualifier: Diagnosis of  By: [name] MD, Phillip Heal    . GERD (gastroesophageal reflux disease)   . 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 Gout   . Gout 07/18/2010   Qualifier: Diagnosis of  By: [name] MD, Phillip Heal    . H/O eye injury    chronic changes to left eye after  injury  . Hand weakness 03/02/2018  . Hearing loss 04/21/2013  . History of TIA (transient ischemic attack)    03/ 2017  no residual after brief episode loss peripheral vision  . Hyperglycemia 05/17/2016  . Hyperlipemia   . Hyperlipidemia 07/18/2010   Qualifier: Diagnosis of  By: [name] MD, Phillip Heal    . Hypertension   . HYPERTENSION, BENIGN ESSENTIAL 07/18/2010   Qualifier: Diagnosis of  By: [name] MD, Phillip Heal    . Hypokalemia 01/26/2017  . Knee pain 03/13/2019  . Left shoulder pain 09/04/2018  . Prostate cancer ([place]) UROLOGIST-  DR GRAPEY/  ONCOLOGIST-  DR MANNING   dx 2015--- Stage T1c, Gleason 3+4, PSA 4.03, vol 44cc  . PVC's (premature ventricular contractions)   . Rash and nonspecific skin eruption 10/14/2015  . 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 Skin lesion 10/14/2015  . TIA (transient ischemic attack) 10/14/2015  . Ventral hernia 10/26/2011  . Wears glasses     Patient Active Problem List   Diagnosis Date Noted  . 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 Knee pain 03/13/2019  . Left shoulder pain 09/04/2018  . Diabetes mellitus without complication ([place]) 123456  . Hand weakness 03/02/2018  . Depression 01/26/2017  . Aphasia 01/26/2017  . Hypokalemia 01/26/2017  . Hyperglycemia 05/17/2016  .  Anxiety state 03/16/2016  . TIA (transient ischemic attack) 10/14/2015  . Rash and nonspecific skin eruption 10/14/2015  . Skin lesion 10/14/2015  . BPV (benign positional vertigo) 11/02/2014  . Advance care planning 05/13/2014  . 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 Hearing loss 04/21/2013  . Ventral hernia 10/26/2011  . Medicare annual wellness visit, subsequent 10/26/2011  . Prostate cancer ([place]) 10/26/2011  . Coronary atherosclerosis 07/19/2010  . GERD 07/19/2010  . Hyperlipidemia 07/18/2010  . Gout 07/18/2010  . HYPERTENSION, BENIGN ESSENTIAL 07/18/2010    Past Surgical History:  Procedure Laterality Date  .  APPENDECTOMY  1975  . CARDIAC CATHETERIZATION  12-22-2001   dr Daneen Schick   severe native vessel disease mLAD 60-70%,  total occulsion pCFX  and pRCA/  widely patent saphenous vein , free radial , and LIMA grafts/  minminal lv dysfunction, ef 60%  . CORONARY ARTERY BYPASS GRAFT  May 2000   LIMA to LAD,  SVG to PDA and Diagonal, Free radial graft to OM  . Exericse treadmill test  last one 01-03-2014  dr Daneen Schick   normal exercise tolerance w/ hypertensive repsonse,  no ischemic EKG changes, appropriate HR response & recovery ([facility] TM score 9;  Low Risk , PVC's w/ exertion)  . PROSTATE BIOPSY    . RADIOACTIVE SEED IMPLANT N/A 01/13/2016   Procedure: RADIOACTIVE SEED IMPLANT/BRACHYTHERAPY IMPLANT;  Surgeon: Rana Snare, MD;  Location: [facility];  Service: Urology;  Laterality: N/A;       Family History  Problem Relation Age of Onset  . Cancer Father        prostate in eighties  . Prostate cancer Father   . Heart disease Father   . Stroke Father   . Dementia Mother   . Cancer Sister        breast  . Cancer Paternal Uncle        prostate  . Colon cancer Neg Hx     Social History   Tobacco Use  . Smoking status: Never Smoker  . Smokeless tobacco: Never Used  Substance Use Topics  . Alcohol use: No  . Drug use: No    Home Medications Prior to Admission medications   Medication Sig Start Date End Date Taking? Authorizing Provider  allopurinol (ZYLOPRIM) 300 MG tablet Take 1 tablet (300 mg total) by mouth daily. 03/13/19   Tonia Ghent, MD  aspirin EC 81 MG tablet Take 1 tablet (81 mg total) by mouth daily. 06/01/19   Belva Crome, MD  atorvastatin (LIPITOR) 40 MG tablet Take 1 tablet (40 mg total) by mouth daily. 03/13/19   Tonia Ghent, MD  Cholecalciferol (VITAMIN D PO) Take 1 capsule by mouth 2 (two) times daily.     [provider]  escitalopram (LEXAPRO) 5 MG tablet Take 1 tablet (5 mg total) by mouth daily. 03/13/19   Tonia Ghent,  MD  esomeprazole (NEXIUM) 20 MG capsule Take 20 mg by mouth daily at 12 noon.    [provider]  hydrochlorothiazide (HYDRODIURIL) 25 MG tablet Take 1 tablet (25 mg total) by mouth daily. 03/13/19   Tonia Ghent, MD  HYDROcodone-acetaminophen (NORCO/VICODIN) 5-325 MG tablet Take 0.5-1 tablets by mouth 3 (three) times daily as needed (for pain.  sedation caution). 10/10/19   Tonia Ghent, MD  Misc Natural Products (BLACK CHERRY CONCENTRATE PO) Take by mouth at bedtime.    [provider]  Multiple Vitamins-Minerals (OCUVITE PO) Take  1 capsule by mouth daily.    [provider]  sodium chloride (OCEAN) 0.65 % SOLN nasal spray Place 1 spray into both nostrils as needed for congestion.    [provider]  tamsulosin (FLOMAX) 0.4 MG CAPS capsule Take 1 capsule (0.4 mg total) by mouth 2 (two) times daily. 03/13/19   Tonia Ghent, MD    Allergies    Patient has no known allergies.  Review of Systems   Review of Systems  All other systems reviewed and are negative.   Physical Exam Updated Vital Signs BP 126/90 (BP Location: Right Arm)   Temp 98.8 F (37.1 C)   Resp 17   Ht 1.753 m (5\' 9" )   Wt 97.5 kg   SpO2 94%   BMI 31.75 kg/m   Physical Exam Vitals and nursing note reviewed.  Constitutional:      General: He is not in acute distress.    Appearance: He is well-developed.  HENT:     Head: Normocephalic and atraumatic.     Mouth/Throat:     Pharynx: No oropharyngeal exudate.  Eyes:     General: No scleral icterus.       Right eye: No discharge.        Left eye: No discharge.     Conjunctiva/sclera: Conjunctivae normal.     Pupils: Pupils are equal, round, and reactive to light.  Neck:     Thyroid: No thyromegaly.     Vascular: No JVD.  Cardiovascular:     Rate and Rhythm: Normal rate and regular rhythm.     Heart sounds: Normal heart sounds. No murmur. No friction rub. No gallop.   Pulmonary:     Effort: Pulmonary effort is  normal. No respiratory distress.     Breath sounds: Normal breath sounds. No wheezing or rales.  Chest:     Chest wall: Tenderness present.  Abdominal:     General: Bowel sounds are normal. There is no distension.     Palpations: Abdomen is soft. There is no mass.     Tenderness: There is no abdominal tenderness.  Musculoskeletal:        General: No tenderness. Normal range of motion.     Cervical back: Normal range of motion and neck supple.  Lymphadenopathy:     Cervical: No cervical adenopathy.  Skin:    General: Skin is warm and dry.     Findings: Bruising present. No erythema or rash.  Neurological:     Mental Status: He is alert.     Coordination: Coordination normal.  Psychiatric:        Behavior: Behavior normal.     ED Results / Procedures / Treatments   Labs (all labs ordered are listed, but only abnormal results are displayed) Labs Reviewed  RESPIRATORY PANEL BY RT PCR (FLU A&B, COVID)  CBC  BASIC METABOLIC PANEL  PROTIME-INR  TYPE AND SCREEN    EKG None  Radiology DG Chest 2 View  Result Date: 10/11/2019 CLINICAL DATA:  Right upper chest wall pain after fall 2 days prior. EXAM: CHEST - 2 VIEW COMPARISON:  09/06/2015 chest radiograph. FINDINGS: Intact sternotomy wires. CABG clips overlie the mediastinum. Stable cardiomediastinal silhouette with normal heart size. Moderate right pneumothorax, approximately 30%. No mediastinal shift. No left pneumothorax. No pleural effusion. No pulmonary edema. No consolidative airspace disease. No displaced fractures. IMPRESSION: 1. Moderate right pneumothorax.  No mediastinal shift. 2. No consolidative airspace disease or significant atelectasis. Critical Value/emergent results were called  by telephone at the time of interpretation on 10/11/2019 at 8:39 am to provider DR. MARNE TOWER who verbally acknowledged these results. Electronically Signed   By: Ilona Sorrel M.D.   On: 10/11/2019 08:43    Procedures Procedures (including  critical care time)  Medications Ordered in ED Medications - No data to display  ED Course  I have reviewed the triage vital signs and the nursing notes.  Pertinent labs & imaging results that were available during my care of the patient were reviewed by me and considered in my medical decision making (see chart for details).    MDM Rules/Calculators/A&P                      I have personally viewed the 2 view PA and lateral chest x-ray performed October 10, 2019, there does appears to be a moderate sized pneumothorax on the right.  The pleural edge is markedly outside of the normal range.  There is no subdiaphragmatic air, there is no subcutaneous emphysema, this matches the patient's clinical exam which this does not appear to be an open pneumothorax, it is not a tension pneumothorax either clinically or radiographically.  I discussed the care with Dr. Grandville Silos of the surgical trauma service who will come see the patient for admission, they will place the chest tube.  This patient will need to be admitted to the hospital, labs have been ordered including Covid as this will be a aerosolizing procedure, he is not symptomatic for coronavirus at this time  Final Clinical Impression(s) / ED Diagnoses Final diagnoses:  Pneumothorax on right    Rx / DC Orders ED Discharge Orders    None       Noemi Chapel, MD 10/11/19 1044

## 2019-10-11 NOTE — Telephone Encounter (Signed)
Just received a call report for cxr that was done yesterday on this pt after a fall   [place] [facility] Moderate pneumothorax on the R  (cannot r/o rib fx)   Please call him and advise he needs to go to the ER for further evaluation so they can treat him  If he is not short of breath now he may become sob (call 911 if sob)   Let him know that Dr [name] is out today but I made him aware

## 2019-10-11 NOTE — H&P (Addendum)
Tyler Guerra. [birth year]  [id number].    Requesting MD: Dr. Noemi Chapel Chief Complaint/Reason for Consult: Non-Level Trauma, PTX Primary Survey: airway intact, breath sounds intact bilaterally, pulses intact peripherally  GCS: 15  HPI: Tyler Guerra. is a 69 y.o. male with a history of HTN, HLD, CAD s/p CABG, DM2, and prior TIA who presented as a nonlevel trauma after a fall that occurred on Sunday night.  Patient reports that late Sunday, 3/7, he accidentally stepped on his dog while walking across the room causing him to lose his balance and fall onto the corner of a chest at the end of his bed.  He notes that he struck the right side of his chest wall.  He denies any head trauma or loss of consciousness.  He thought he may have had a rib fracture so he tried to stick it out at home with oral medications without any relief.  He has some shortness of breath at night. None currently. He saw his primary care provider who ordered a chest x-ray yesterday.  This showed a moderate right pneumothorax.  He was advised to go to the emergency department.  Trauma surgery was asked to admit.  Patient denies any other areas of pain.  ROS: Review of Systems  Constitutional: Negative for chills and fever.  Eyes: Negative for double vision.  Respiratory: Positive for shortness of breath.   Cardiovascular: Positive for chest pain. Negative for leg swelling.  Gastrointestinal: Negative for abdominal pain, diarrhea, nausea and vomiting.  Genitourinary: Negative.   Skin: Negative for rash.       Bruising  Neurological: Negative for seizures and headaches.  Psychiatric/Behavioral: Negative for substance abuse.  All other systems reviewed and are negative.   Family History  Problem Relation Age of Onset  . Cancer Father        prostate in eighties  . Prostate cancer Father   . Heart disease Father   . Stroke Father   . Dementia Mother   . Cancer Sister        breast  . Cancer Paternal  Uncle        prostate  . Colon cancer Neg Hx     Past Medical History:  Diagnosis Date  . Anxiety state 03/16/2016  . Aphasia 01/26/2017  . Benign localized prostatic hyperplasia with lower urinary tract symptoms (LUTS)   . BPV (benign positional vertigo) 11/02/2014  . Carotid artery stenosis    PER DUPLEX 10-22-2015  BILATERAL ICA 1-39%  . Coronary artery disease    CARDIOLOGIST-  DR Daneen Schick--  VISIT EVERY OTHER YEAR-- [place] 2015  . Coronary atherosclerosis 07/19/2010   Qualifier: Diagnosis of  By: [name] MD, Phillip Heal    . Depression 01/26/2017  . Diabetes mellitus without complication ([place]) 0000000  . GERD 07/19/2010   Qualifier: Diagnosis of  By: [name] MD, Phillip Heal    . GERD (gastroesophageal reflux disease)   . 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 Gout   . Gout 07/18/2010   Qualifier: Diagnosis of  By: [name] MD, Phillip Heal    . H/O eye injury    chronic changes to left eye after injury  . Hand weakness 03/02/2018  . Hearing loss 04/21/2013  . History of TIA (transient ischemic attack)    03/ 2017  no residual after brief episode loss peripheral vision  . Hyperglycemia 05/17/2016  . Hyperlipemia   . Hyperlipidemia 07/18/2010   Qualifier: Diagnosis of  By: [name] MD, Phillip Heal    .  Hypertension   . HYPERTENSION, BENIGN ESSENTIAL 07/18/2010   Qualifier: Diagnosis of  By: [name] MD, Phillip Heal    . Hypokalemia 01/26/2017  . Knee pain 03/13/2019  . Left shoulder pain 09/04/2018  . Prostate cancer ([place]) UROLOGIST-  DR GRAPEY/  ONCOLOGIST-  DR MANNING   dx 2015--- Stage T1c, Gleason 3+4, PSA 4.03, vol 44cc  . PVC's (premature ventricular contractions)   . Rash and nonspecific skin eruption 10/14/2015  . 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 Skin lesion 10/14/2015  . TIA (transient ischemic attack) 10/14/2015  . Ventral hernia 10/26/2011  . Wears glasses     Past Surgical History:  Procedure Laterality Date  . APPENDECTOMY  1975  . CARDIAC  CATHETERIZATION  12-22-2001   dr Daneen Schick   severe native vessel disease mLAD 60-70%,  total occulsion pCFX  and pRCA/  widely patent saphenous vein , free radial , and LIMA grafts/  minminal lv dysfunction, ef 60%  . CORONARY ARTERY BYPASS GRAFT  May 2000   LIMA to LAD,  SVG to PDA and Diagonal, Free radial graft to OM  . Exericse treadmill test  last one 01-03-2014  dr Daneen Schick   normal exercise tolerance w/ hypertensive repsonse,  no ischemic EKG changes, appropriate HR response & recovery ([facility] TM score 9;  Low Risk , PVC's w/ exertion)  . PROSTATE BIOPSY    . RADIOACTIVE SEED IMPLANT N/A 01/13/2016   Procedure: RADIOACTIVE SEED IMPLANT/BRACHYTHERAPY IMPLANT;  Surgeon: Rana Snare, MD;  Location: [facility];  Service: Urology;  Laterality: N/A;    Social History:  reports that he has never smoked. He has never used smokeless tobacco. He reports that he does not drink alcohol or use drugs. Patient is retired Lives at home with his wife and dog  Allergies: No Known Allergies  (Not in a hospital admission)    Physical Exam: Blood pressure (!) 141/90, pulse 81, temperature 98.8 F (37.1 C), resp. rate 17, height 5\' 9"  (1.753 m), weight 97.5 kg, SpO2 98 %. Physical Exam  Constitutional: He is oriented to person, place, and time and well-developed, well-nourished, and in no distress. No distress. He is not intubated.  HENT:  Head: Normocephalic and atraumatic. Head is without raccoon's eyes and without Battle's sign.  Right Ear: External ear normal.  Left Ear: External ear normal.  Nose: Nose normal.  Mouth/Throat: Uvula is midline, oropharynx is clear and moist and mucous membranes are normal. Normal dentition. No oropharyngeal exudate.  Eyes: Pupils are equal, round, and reactive to light. Conjunctivae, EOM and lids are normal.  Neck: Trachea normal and phonation normal. No tracheal deviation present.  Cardiovascular: Normal rate, regular rhythm, normal heart  sounds and intact distal pulses.  No murmur heard. Pulses:      Radial pulses are 2+ on the right side and 2+ on the left side.       Dorsalis pedis pulses are 2+ on the right side and 2+ on the left side.       Posterior tibial pulses are 2+ on the right side and 2+ on the left side.  Pulmonary/Chest: Effort normal. No accessory muscle usage or stridor. No tachypnea. He is not intubated. No respiratory distress. He has decreased breath sounds in the right upper field. He has no wheezes. He has no rhonchi. He has no rales. He exhibits tenderness. He exhibits no edema and no deformity.  Bruising  of the right upper chest   Abdominal: Soft. Normal appearance and bowel sounds are normal. He exhibits no distension and no mass. There is no hepatomegaly. There is no abdominal tenderness. There is no rigidity, no rebound and no guarding. No hernia.  Musculoskeletal:        General: Normal range of motion.     Cervical back: Full passive range of motion without pain, normal range of motion and neck supple. No spinous process tenderness.     Comments: No C,T or L spine tenderness or step offs. Passive rom of the RUE,LUE,RLE and LLE without pain or difficulty.   Neurological: He is alert and oriented to person, place, and time. He has normal motor skills, normal strength and intact cranial nerves. Gait normal. Abnormal gait: did not assess. GCS score is 15.  Skin: Skin is warm and dry. Ecchymosis noted. He is not diaphoretic.  Small cut to the right 5th digit that appears to already be healing  Psychiatric: Mood, memory, affect and judgment normal.  Nursing note and vitals reviewed.    Results for orders placed or performed during the hospital encounter of 10/11/19 (from the past 48 hour(s))  CBC     Status: None   Collection Time: 10/11/19 10:41 AM  Result Value Ref Range   WBC 5.0 4.0 - 10.5 K/uL   RBC 4.65 4.22 - 5.81 MIL/uL   Hemoglobin 13.9 13.0 - 17.0 g/dL   HCT 42.7 39.0 - 52.0 %   MCV 91.8  80.0 - 100.0 fL   MCH 29.9 26.0 - 34.0 pg   MCHC 32.6 30.0 - 36.0 g/dL   RDW 13.1 11.5 - 15.5 %   Platelets 157 150 - 400 K/uL   nRBC 0.0 0.0 - 0.2 %    Comment: Performed at [facility], [place] [address], [place], [place] [address]  Type and screen     Status: None (Preliminary result)   Collection Time: 10/11/19 10:51 AM  Result Value Ref Range   ABO/RH(D) PENDING    Antibody Screen PENDING    Sample Expiration      10/14/2019,2359 Performed at [facility], [place] [address] [place], [place] [address]    DG Chest 2 View  Result Date: 10/11/2019 CLINICAL DATA:  Right upper chest wall pain after fall 2 days prior. EXAM: CHEST - 2 VIEW COMPARISON:  09/06/2015 chest radiograph. FINDINGS: Intact sternotomy wires. CABG clips overlie the mediastinum. Stable cardiomediastinal silhouette with normal heart size. Moderate right pneumothorax, approximately 30%. No mediastinal shift. No left pneumothorax. No pleural effusion. No pulmonary edema. No consolidative airspace disease. No displaced fractures. IMPRESSION: 1. Moderate right pneumothorax.  No mediastinal shift. 2. No consolidative airspace disease or significant atelectasis. Critical Value/emergent results were called by telephone at the time of interpretation on 10/11/2019 at 8:39 am to provider DR. MARNE TOWER who verbally acknowledged these results. Electronically Signed   By: Ilona Sorrel M.D.   On: 10/11/2019 08:43   Anti-infectives (From admission, onward)   None     Assessment/Plan Fall Right PTX - Pigtail placed. CXR now and in AM. Keep to -20. Pulm toilet, mobilize  HTN HLD CAD s/p CABG DM2 Prior TIA  FEN - Regular, IVF VTE - SCDs, Lovenox ID - None Plan: Admit to inpatient. CXR now. CT to -20. Home meds will need to be verified and reordered once pharmacy comes by.   Jillyn Ledger, [facility] Surgery 10/11/2019, 11:33 AM Please see Amion for  pager number during day hours 7:00am-4:30pm

## 2019-10-11 NOTE — Procedures (Signed)
Chest Tube Insertion Procedure Note  Indications:  Clinically significant R pneumothorax  Pre-operative Diagnosis: R pneumothorax  Post-operative Diagnosis: R pneumothorax  Surgeon: Georganna Skeans, MD  Assist: Alferd Apa, PA-C  Procedure Details  Informed consent was obtained for the procedure, including sedation.  Risks of lung perforation, hemorrhage, arrhythmia, and adverse drug reaction were discussed.   After sterile skin prep, using standard technique, a 14 French tube was placed in the right anterior axillary line above the nipple level using local and sterile Seldinger technique. It was sutured in place. It was hooked up to a Pleurevac and a sterile dressing was applied.  Findings: air  Estimated Blood Loss:  Minimal         Specimens:  None              Complications:  None; patient tolerated the procedure well.         Disposition: to be admitted         Condition: stable  Georganna Skeans, MD, MPH, FACS Please use [email or web address] to contact on call provider

## 2019-10-11 NOTE — Telephone Encounter (Signed)
Spoke with pt and also wife. Advised them of xray results. They will take him to the ER now

## 2019-10-11 NOTE — TOC Initial Note (Signed)
Transition of Care (TOC) - Initial/Assessment Note    Patient Details  Name: Tyler Guerra. MRN: [id number] Date of Birth: 1951/07/02  Transition of Care [facility]) CM/SW Contact:    Ella Bodo, RN Phone Number: 10/11/2019, 3:18 PM  Clinical Narrative:  Pt admitted on 10/11/19 s/p fall with Rt PTX requiring chest tube placement.  PTA, pt independent, lives at home with spouse, who can provide assistance at discharge.  PCP is Dr. Elsie Stain; pt denies any home needs at this time.    SBIRT completed; pt denies ETOH use or need for cessation resources.                  Expected Discharge Plan: Home/Self Care Barriers to Discharge: Continued Medical Work up   Patient Goals and CMS Choice Patient states their goals for this hospitalization and ongoing recovery are:: to go home tomorrow      Expected Discharge Plan and Services Expected Discharge Plan: Home/Self Care   Discharge Planning Services: CM Consult   Living arrangements for the past 2 months: Single Family Home                                      Prior Living Arrangements/Services Living arrangements for the past 2 months: Single Family Home Lives with:: Spouse Patient language and need for interpreter reviewed:: Yes Do you feel safe going back to the place where you live?: Yes      Need for Family Participation in Patient Care: Yes (Comment) Care giver support system in place?: Yes (comment)   Criminal Activity/Legal Involvement Pertinent to Current Situation/Hospitalization: No - Comment as needed  Activities of Daily Living      Permission Sought/Granted                  Emotional Assessment Appearance:: Appears stated age Attitude/Demeanor/Rapport: Gracious, Engaged Affect (typically observed): Accepting Orientation: : Oriented to Self, Oriented to  Time, Oriented to Place, Oriented to Situation Alcohol / Substance Use: Not Applicable Psych Involvement: No (comment)  Admission  diagnosis:  Pneumothorax on right [J93.9] Pneumothorax [J93.9] Chest tube in place [Z96.89] Patient Active Problem List   Diagnosis Date Noted  . Pneumothorax 10/11/2019  . Chest wall pain 10/11/2019  . 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 Knee pain 03/13/2019  . Left shoulder pain 09/04/2018  . Diabetes mellitus without complication ([place]) 123456  . Hand weakness 03/02/2018  . Depression 01/26/2017  . Aphasia 01/26/2017  . Hypokalemia 01/26/2017  . Hyperglycemia 05/17/2016  . Anxiety state 03/16/2016  . TIA (transient ischemic attack) 10/14/2015  . Rash and nonspecific skin eruption 10/14/2015  . Skin lesion 10/14/2015  . BPV (benign positional vertigo) 11/02/2014  . Advance care planning 05/13/2014  . 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 Hearing loss 04/21/2013  . Ventral hernia 10/26/2011  . Medicare annual wellness visit, subsequent 10/26/2011  . Prostate cancer ([place]) 10/26/2011  . Coronary atherosclerosis 07/19/2010  . GERD 07/19/2010  . Hyperlipidemia 07/18/2010  . Gout 07/18/2010  . HYPERTENSION, BENIGN ESSENTIAL 07/18/2010   PCP:  Tonia Ghent, MD Pharmacy:   [facility] [address], [place] [address] Phone: [phone] Fax: [place], [place] [address] Phone: [phone] Fax: [phone]  [place], [place] [address] [place] [address] Phone: [phone] Fax: [phone]     Social Determinants of Health (SDOH) Interventions    Readmission Risk Interventions No flowsheet data found.   Reinaldo Raddle, RN, BSN  Trauma/Neuro ICU Case Manager [phone]

## 2019-10-11 NOTE — Assessment & Plan Note (Signed)
We discussed options.  We talked about getting a chest x-ray not so much to confirm a broken rib but exclude other pathology.  At the time of exam he had clear and equal breath sounds and appeared to be okay for outpatient follow-up.  We talked about routine opiate use and cautions for pain control.  Follow-up chest x-ray did show pneumothorax and he was advised to go to the emergency room for treatment.  Per EMR he has been seen and treated at the emergency room.  I appreciate the help of all involved.

## 2019-10-11 NOTE — ED Notes (Signed)
Paged Trauma per Dr. Sabra Heck

## 2019-10-12 ENCOUNTER — Telehealth: Payer: Self-pay | Admitting: Family Medicine

## 2019-10-12 ENCOUNTER — Inpatient Hospital Stay (HOSPITAL_COMMUNITY): Payer: Medicare Other

## 2019-10-12 LAB — CBC
HCT: 38.2 % — ABNORMAL LOW (ref 39.0–52.0)
Hemoglobin: 12.8 g/dL — ABNORMAL LOW (ref 13.0–17.0)
MCH: 30.3 pg (ref 26.0–34.0)
MCHC: 33.5 g/dL (ref 30.0–36.0)
MCV: 90.3 fL (ref 80.0–100.0)
Platelets: 156 10*3/uL (ref 150–400)
RBC: 4.23 MIL/uL (ref 4.22–5.81)
RDW: 13.2 % (ref 11.5–15.5)
WBC: 4.9 10*3/uL (ref 4.0–10.5)
nRBC: 0 % (ref 0.0–0.2)

## 2019-10-12 LAB — BASIC METABOLIC PANEL
Anion gap: 10 (ref 5–15)
BUN: 16 mg/dL (ref 8–23)
CO2: 26 mmol/L (ref 22–32)
Calcium: 9.1 mg/dL (ref 8.9–10.3)
Chloride: 102 mmol/L (ref 98–111)
Creatinine, Ser: 1.06 mg/dL (ref 0.61–1.24)
GFR calc Af Amer: >60 mL/min (ref >60)
GFR calc non Af Amer: >60 mL/min (ref >60)
Glucose, Bld: 119 mg/dL — ABNORMAL HIGH (ref 70–99)
Potassium: 3.5 mmol/L (ref 3.5–5.1)
Sodium: 138 mmol/L (ref 135–145)

## 2019-10-12 NOTE — Telephone Encounter (Signed)
Called and talked to patient.  He is improving and I'll defer to inpatient team.  I thank all involved.  I wish him well.  He thanked me for the call.

## 2019-10-12 NOTE — Progress Notes (Signed)
Subjective: CC: Doing well.  Reports "twinges of pain" near where his chest tube is located.  He denies any shortness of breath.  He is pulling 1750 on incentive spirometer.  Currently on room air.  He is tolerating a diet without any nausea or vomiting.  He denies any other areas of pain.  He has not gotten out of bed since admission.   Objective: Vital signs in last 24 hours: Temp:  [98.7 F (37.1 C)-100.1 F (37.8 C)] 100.1 F (37.8 C) (03/11 0629) Pulse Rate:  [68-81] 78 (03/11 0629) Resp:  [15-18] 17 (03/11 0629) BP: (126-145)/(83-93) 136/85 (03/11 0629) SpO2:  [92 %-100 %] 92 % (03/11 0629) Weight:  [97.5 kg] 97.5 kg (03/10 1006) Last BM Date: 10/10/19  Intake/Output from previous day: 03/10 0701 - 03/11 0700 In: 1250.6 [P.O.:480; I.V.:770.6] Out: 400 [Urine:400] Intake/Output this shift: Total I/O In: 240 [P.O.:240] Out: 200 [Urine:200]  PE: Gen:  Alert, NAD, pleasant HEENT: EOM's intact, pupils equal and round Card:  RRR, no M/G/R heard Pulm:  CTAB, no W/R/R, effort normal. CT in place at -20. Site c/d/i with bandage in place. No air leak. Minimal output in cannister. Pulling 1750 on IS  Abd: Soft, NT/ND, +BS Ext:  No LE edema Psych: A&Ox3  Skin: no rashes noted, warm and dry  Lab Results:  Recent Labs    10/11/19 1041 10/12/19 0213  WBC 5.0 4.9  HGB 13.9 12.8*  HCT 42.7 38.2*  PLT 157 156   BMET Recent Labs    10/11/19 1041 10/12/19 0213  NA 138 138  K 3.9 3.5  CL 102 102  CO2 26 26  GLUCOSE 154* 119*  BUN 14 16  CREATININE 1.14 1.06  CALCIUM 9.3 9.1   PT/INR Recent Labs    10/11/19 1041  LABPROT 13.8  INR 1.1   CMP     Component Value Date/Time   NA 138 10/12/2019 0213   K 3.5 10/12/2019 0213   CL 102 10/12/2019 0213   CO2 26 10/12/2019 0213   GLUCOSE 119 (H) 10/12/2019 0213   GLUCOSE 91 08/06/2010 1536   BUN 16 10/12/2019 0213   CREATININE 1.06 10/12/2019 0213   CALCIUM 9.1 10/12/2019 0213   PROT 6.8 03/09/2019 1259    ALBUMIN 4.3 03/09/2019 1259   AST 15 03/09/2019 1259   ALT 15 03/09/2019 1259   ALKPHOS 114 03/09/2019 1259   BILITOT 0.8 03/09/2019 1259   GFRNONAA >60 10/12/2019 0213   GFRAA >60 10/12/2019 0213   Lipase  No results found for: LIPASE     Studies/Results: DG Chest 2 View  Result Date: 10/11/2019 CLINICAL DATA:  Right upper chest wall pain after fall 2 days prior. EXAM: CHEST - 2 VIEW COMPARISON:  09/06/2015 chest radiograph. FINDINGS: Intact sternotomy wires. CABG clips overlie the mediastinum. Stable cardiomediastinal silhouette with normal heart size. Moderate right pneumothorax, approximately 30%. No mediastinal shift. No left pneumothorax. No pleural effusion. No pulmonary edema. No consolidative airspace disease. No displaced fractures. IMPRESSION: 1. Moderate right pneumothorax.  No mediastinal shift. 2. No consolidative airspace disease or significant atelectasis. Critical Value/emergent results were called by telephone at the time of interpretation on 10/11/2019 at 8:39 am to provider DR. MARNE TOWER who verbally acknowledged these results. Electronically Signed   By: Ilona Sorrel M.D.   On: 10/11/2019 08:43   DG CHEST PORT 1 VIEW  Result Date: 10/12/2019 CLINICAL DATA:  Follow-up right-sided pneumothorax. EXAM: PORTABLE CHEST 1 VIEW COMPARISON:  10/11/2019 FINDINGS: The right-sided chest tube is in good position. No residual pneumothorax. Stable mild cardiac enlargement and tortuous thoracic aorta. Stable surgical changes from bypass surgery. Low lung volumes with streaky basilar atelectasis. IMPRESSION: Right-sided chest tube in good position without residual pneumothorax. Electronically Signed   By: Marijo Sanes M.D.   On: 10/12/2019 08:34   DG CHEST PORT 1 VIEW  Result Date: 10/11/2019 CLINICAL DATA:  Chest tube placement EXAM: PORTABLE CHEST 1 VIEW COMPARISON:  October 10, 2019 FINDINGS: The heart size and mediastinal contours are unchanged. Overlying median sternotomy wires and  surgical clips. Interval placement of a right-sided pigtail catheter is seen. No definite residual pneumothorax is seen. The left lung is clear. No acute osseous abnormality. IMPRESSION: Interval placement of right-sided chest tube with no definite residual pneumothorax. Electronically Signed   By: Prudencio Pair M.D.   On: 10/11/2019 11:48    Anti-infectives: Anti-infectives (From admission, onward)   None       Assessment/Plan Fall Right PTX - CXR this am without residual PTX. CT to [facility]. AM Xray. Mobilize, pulm toilet  HTN - home meds  HLD - home meds  CAD s/p CABG DM2 - Reports diet controlled. No meds. Glucose 119 on BMP this am  Prior TIA  FEN - Regular, d/c IVF VTE - SCDs, Lovenox ID - None Plan: CT to [facility]. Mobilize. AM xray.     LOS: 1 day    Jillyn Ledger , [facility] Surgery 10/12/2019, 9:48 AM Please see Amion for pager number during day hours 7:00am-4:30pm

## 2019-10-13 ENCOUNTER — Telehealth: Payer: Self-pay

## 2019-10-13 ENCOUNTER — Inpatient Hospital Stay (HOSPITAL_COMMUNITY): Payer: Medicare Other

## 2019-10-13 MED ORDER — OXYCODONE HCL 5 MG PO TABS: 5.0000 mg | ORAL_TABLET | Freq: Four times a day (QID) | ORAL | 0 refills | Status: DC | PRN

## 2019-10-13 MED ORDER — ACETAMINOPHEN 325 MG PO TABS: 650.0000 mg | ORAL_TABLET | Freq: Four times a day (QID) | ORAL | Status: DC | PRN

## 2019-10-13 NOTE — Discharge Instructions (Signed)
PNEUMOTHORAX OR HEMOTHORAX +/- RIB FRACTURES  HOME INSTRUCTIONS   1. PAIN CONTROL:  1. Pain is best controlled by a usual combination of three different methods TOGETHER:  i. Ice/Heat ii. Over the counter pain medication iii. Prescription pain medication 2. You may experience some swelling and bruising in area of broken ribs. Ice packs or heating pads (30-60 minutes up to 6 times a day) will help. Use ice for the first few days to help decrease swelling and bruising, then switch to heat to help relax tight/sore spots and speed recovery. Some people prefer to use ice alone, heat alone, alternating between ice & heat. Experiment to what works for you. Swelling and bruising can take several weeks to resolve.  3. It is helpful to take an over-the-counter pain medication regularly for the first few weeks. Choose one of the following that works best for you:  i. Naproxen (Aleve, etc) Two 220mg tabs twice a day ii. Ibuprofen (Advil, etc) Three 200mg tabs four times a day (every meal & bedtime) iii. Acetaminophen (Tylenol, etc) 500-650mg four times a day (every meal & bedtime) 4. A prescription for pain medication (such as oxycodone, hydrocodone, etc) may be given to you upon discharge. Take your pain medication as prescribed.  i. If you are having problems/concerns with the prescription medicine (does not control pain, nausea, vomiting, rash, itching, etc), please call us [phone] to see if we need to switch you to a different pain medicine that will work better for you and/or control your side effect better. ii. If you need a refill on your pain medication, please contact your pharmacy. They will contact our office to request authorization. Prescriptions will not be filled after 5 pm or on week-ends. 1. Avoid getting constipated. When taking pain medications, it is common to experience some constipation. Increasing fluid intake and taking a fiber supplement (such as Metamucil, Citrucel, FiberCon,  MiraLax, etc) 1-2 times a day regularly will usually help prevent this problem from occurring. A mild laxative (prune juice, Milk of Magnesia, MiraLax, etc) should be taken according to package directions if there are no bowel movements after 48 hours.  2. Watch out for diarrhea. If you have many loose bowel movements, simplify your diet to bland foods & liquids for a few days. Stop any stool softeners and decrease your fiber supplement. Switching to mild anti-diarrheal medications (Kayopectate, Pepto Bismol) can help. If this worsens or does not improve, please call us. 3. Chest tube site wound: you may remove the dressing from your chest tube site 3 days after the removal of your chest tube. DO NOT shower over the dressing. Once   removed, you may shower as normal. Do not submerge your wound in water for 2-3 weeks.  4. FOLLOW UP  a. Please call our office to set up or confirm an appointment for follow up for 2 weeks after discharge. You will need to get a chest xray at either [facility] or [facility]. This will be outlined in your follow up instructions. Please call [facility] at [phone] if you have any questions about follow up.  b. If you have any orthopedic or other injuries you will need to follow up as outlined in your follow up instructions.   WHEN TO CALL US [phone]:  1. Poor pain control 2. Reactions / problems with new medications (rash/itching, nausea, etc)  3. Fever over 101.5 F (38.5 C) 4. Worsening swelling or bruising 5. Redness, drainage, pain or swelling around chest   tube site 6. Worsening pain, productive cough, difficulty breathing or any other concerning symptoms  The clinic staff is available to answer your questions during regular business hours (8:30am-5pm). Please don't hesitate to call and ask to speak to one of our nurses for clinical concerns.  If you have a medical emergency, go to the nearest emergency room or call 911.  A surgeon from [facility] is always on call at the hospitals   [facility]  [address], [address], [place], [place] [address] ?  MAIN: [phone] ? TOLL FREE: [phone] ?  FAX [phone]  [email or web address]      Information on Rib Fractures  A rib fracture is a break or crack in one of the bones of the ribs. The ribs are long, curved bones that wrap around your chest and attach to your spine and your breastbone. The ribs protect your heart, lungs, and other organs in the chest. A broken or cracked rib is often painful but is not usually serious. Most rib fractures heal on their own over time. However, rib fractures can be more serious if multiple ribs are broken or if broken ribs move out of place and push against other structures or organs. What are the causes? This condition is caused by:  Repetitive movements with high force, such as pitching a baseball or having severe coughing spells.  A direct blow to the chest, such as a sports injury, a car accident, or a fall.  Cancer that has spread to the bones, which can weaken bones and cause them to break. What are the signs or symptoms? Symptoms of this condition include:  Pain when you breathe in or cough.  Pain when someone presses on the injured area.  Feeling short of breath. How is this diagnosed? This condition is diagnosed with a physical exam and medical history. Imaging tests may also be done, such as:  Chest X-ray.  CT scan.  MRI.  Bone scan.  Chest ultrasound. How is this treated? Treatment for this condition depends on the severity of the fracture. Most rib fractures usually heal on their own in 1-3 months. Sometimes healing takes longer if there is a cough that does not stop or if there are other activities that make the injury worse (aggravating factors). While you heal, you will be given medicines to control the pain. You will also be taught deep breathing  exercises. Severe injuries may require hospitalization or surgery. Follow these instructions at home: Managing pain, stiffness, and swelling  If directed, apply ice to the injured area. ? Put ice in a plastic bag. ? Place a towel between your skin and the bag. ? Leave the ice on for 20 minutes, 2-3 times a day.  Take over-the-counter and prescription medicines only as told by your health care provider. Activity  Avoid a lot of activity and any activities or movements that cause pain. Be careful during activities and avoid bumping the injured rib.  Slowly increase your activity as told by your health care provider. General instructions  Do deep breathing exercises as told by your health care provider. This helps prevent pneumonia, which is a common complication of a broken rib. Your health care provider may instruct you to: ? Take deep breaths several times a day. ? Try to cough several times a day, holding a pillow against the injured area. ? Use a device called incentive spirometer to practice deep breathing several times a day.  Drink enough   fluid to keep your urine pale yellow.  Do not wear a rib belt or binder. These restrict breathing, which can lead to pneumonia.  Keep all follow-up visits as told by your health care provider. This is important. Contact a health care provider if:  You have a fever. Get help right away if:  You have difficulty breathing or you are short of breath.  You develop a cough that does not stop, or you cough up thick or bloody sputum.  You have nausea, vomiting, or pain in your abdomen.  Your pain gets worse and medicine does not help. Summary  A rib fracture is a break or crack in one of the bones of the ribs.  A broken or cracked rib is often painful but is not usually serious.  Most rib fractures heal on their own over time.  Treatment for this condition depends on the severity of the fracture.  Avoid a lot of activity and any  activities or movements that cause pain. This information is not intended to replace advice given to you by your health care provider. Make sure you discuss any questions you have with your health care provider. Document Released: 07/20/2005 Document Revised: 10/19/2016 Document Reviewed: 10/19/2016 Elsevier Interactive Patient Education  2019 Elsevier Inc.    Pneumothorax A pneumothorax is commonly called a collapsed lung. It is a condition in which air leaks from a lung and builds up between the thin layer of tissue that covers the lungs (visceral pleura) and the interior wall of the chest cavity (parietal pleura). The air gets trapped outside the lung, between the lung and the chest wall (pleural space). The air takes up space and prevents the lung from fully expanding. This condition sometimes occurs suddenly with no apparent cause. The buildup of air may be small or large. A small pneumothorax may go away on its own. A large pneumothorax will require treatment and hospitalization. What are the causes? This condition may be caused by:  Trauma and injury to the chest wall.  Surgery and other medical procedures.  A complication of an underlying lung problem, especially chronic obstructive pulmonary disease (COPD) or emphysema. Sometimes the cause of this condition is not known. What increases the risk? You are more likely to develop this condition if:  You have an underlying lung problem.  You smoke.  You are 20-40 years old, male, tall, and underweight.  You have a personal or family history of pneumothorax.  You have an eating disorder (anorexia nervosa). This condition can also happen quickly, even in people with no history of lung problems. What are the signs or symptoms? Sometimes a pneumothorax will have no symptoms. When symptoms are present, they can include:  Chest pain.  Shortness of breath.  Increased rate of breathing.  Bluish color to your lips or skin  (cyanosis). How is this diagnosed? This condition may be diagnosed by:  A medical history and physical exam.  A chest X-ray, chest CT scan, or ultrasound. How is this treated? Treatment depends on how severe your condition is. The goal of treatment is to remove the extra air and allow your lung to expand back to its normal size.  For a small pneumothorax: ? No treatment may be needed. ? Extra oxygen is sometimes used to make it go away more quickly.  For a large pneumothorax or a pneumothorax that is causing symptoms, a procedure is done to drain the air from your lungs. To do this, a health care provider may   use: ? A needle with a syringe. This is used to suck air from a pleural space where no additional leakage is taking place. ? A chest tube. This is used to suck air where there is ongoing leakage into the pleural space. The chest tube may need to remain in place for several days until the air leak has healed.  In more severe cases, surgery may be needed to repair the damage that is causing the leak.  If you have multiple pneumothorax episodes or have an air leak that will not heal, a procedure called a pleurodesis may be done. A medicine is placed in the pleural space to irritate the tissues around the lung so that the lung will stick to the chest wall, seal any leaks, and stop any buildup of air in that space. If you have an underlying lung problem, severe symptoms, or a large pneumothorax you will usually need to stay in the hospital. Follow these instructions at home: Lifestyle  Do not use any products that contain nicotine or tobacco, such as cigarettes and e-cigarettes. These are major risk factors in pneumothorax. If you need help quitting, ask your health care provider.  Do not lift anything that is heavier than 10 lb (4.5 kg), or the limit that your health care provider tells you, until he or she says that it is safe.  Avoid activities that take a lot of effort (strenuous)  for as long as told by your health care provider.  Return to your normal activities as told by your health care provider. Ask your health care provider what activities are safe for you.  Do not fly in an airplane or scuba dive until your health care provider says it is okay. General instructions  Take over-the-counter and prescription medicines only as told by your health care provider.  If a cough or pain makes it difficult for you to sleep at night, try sleeping in a semi-upright position in a recliner or by using 2 or 3 pillows.  If you had a chest tube and it was removed, ask your health care provider when you can remove the bandage (dressing). While the dressing is in place, do not allow it to get wet.  Keep all follow-up visits as told by your health care provider. This is important. Contact a health care provider if:  You cough up thick mucus (sputum) that is yellow or green in color.  You were treated with a chest tube, and you have redness, increasing pain, or discharge at the site where it was placed. Get help right away if:  You have increasing chest pain or shortness of breath.  You have a cough that will not go away.  You begin coughing up blood.  You have pain that is getting worse or is not controlled with medicines.  The site where your chest tube was located opens up.  You feel air coming out of the site where the chest tube was placed.  You have a fever or persistent symptoms for more than 2-3 days.  You have a fever and your symptoms suddenly get worse. These symptoms may represent a serious problem that is an emergency. Do not wait to see if the symptoms will go away. Get medical help right away. Call your local emergency services (911 in the [place] Do not drive yourself to the hospital. Summary  A pneumothorax, commonly called a collapsed lung, is a condition in which air leaks from a lung and gets trapped between the   lung and the chest wall (pleural  space).  The buildup of air may be small or large. A small pneumothorax may go away on its own. A large pneumothorax will require treatment and hospitalization.  Treatment for this condition depends on how severe the pneumothorax is. The goal of treatment is to remove the extra air and allow the lung to expand back to its normal size. This information is not intended to replace advice given to you by your health care provider. Make sure you discuss any questions you have with your health care provider. Document Released: 07/20/2005 Document Revised: 06/28/2017 Document Reviewed: 06/28/2017 Elsevier Interactive Patient Education  2019 Elsevier Inc.   

## 2019-10-13 NOTE — Progress Notes (Signed)
Subjective: CC: Good pain control. Only notes pain with movement. No SOB. Mobilized in halls x 3 yesterday. No other complaints.   Objective: Vital signs in last 24 hours: Temp:  [98.6 F (37 C)-100.2 F (37.9 C)] 98.9 F (37.2 C) (03/12 0435) Pulse Rate:  [66-72] 66 (03/12 0435) Resp:  [14-19] 19 (03/12 0435) BP: (122-149)/(77-90) 141/90 (03/12 0435) SpO2:  [97 %-99 %] 98 % (03/12 0435) Last BM Date: 10/10/19  Intake/Output from previous day: 03/11 0701 - 03/12 0700 In: 1080 [P.O.:1080] Out: 1800 [Urine:1800] Intake/Output this shift: No intake/output data recorded.  PE: Gen:  Alert, NAD, pleasant Card:  RRR Pulm:  CTAB, no W/R/R, effort normal. CT removed and dressing applied  Abd: Soft, NT/ND, +BS Ext:  No LE edema  Psych: A&Ox3  Skin: no rashes noted, warm and dry  Lab Results:  Recent Labs    10/11/19 1041 10/12/19 0213  WBC 5.0 4.9  HGB 13.9 12.8*  HCT 42.7 38.2*  PLT 157 156   BMET Recent Labs    10/11/19 1041 10/12/19 0213  NA 138 138  K 3.9 3.5  CL 102 102  CO2 26 26  GLUCOSE 154* 119*  BUN 14 16  CREATININE 1.14 1.06  CALCIUM 9.3 9.1   PT/INR Recent Labs    10/11/19 1041  LABPROT 13.8  INR 1.1   CMP     Component Value Date/Time   NA 138 10/12/2019 0213   K 3.5 10/12/2019 0213   CL 102 10/12/2019 0213   CO2 26 10/12/2019 0213   GLUCOSE 119 (H) 10/12/2019 0213   GLUCOSE 91 08/06/2010 1536   BUN 16 10/12/2019 0213   CREATININE 1.06 10/12/2019 0213   CALCIUM 9.1 10/12/2019 0213   PROT 6.8 03/09/2019 1259   ALBUMIN 4.3 03/09/2019 1259   AST 15 03/09/2019 1259   ALT 15 03/09/2019 1259   ALKPHOS 114 03/09/2019 1259   BILITOT 0.8 03/09/2019 1259   GFRNONAA >60 10/12/2019 0213   GFRAA >60 10/12/2019 0213   Lipase  No results found for: LIPASE     Studies/Results: DG CHEST PORT 1 VIEW  Result Date: 10/12/2019 CLINICAL DATA:  Follow-up right-sided pneumothorax. EXAM: PORTABLE CHEST 1 VIEW COMPARISON:  10/11/2019  FINDINGS: The right-sided chest tube is in good position. No residual pneumothorax. Stable mild cardiac enlargement and tortuous thoracic aorta. Stable surgical changes from bypass surgery. Low lung volumes with streaky basilar atelectasis. IMPRESSION: Right-sided chest tube in good position without residual pneumothorax. Electronically Signed   By: Marijo Sanes M.D.   On: 10/12/2019 08:34   DG CHEST PORT 1 VIEW  Result Date: 10/11/2019 CLINICAL DATA:  Chest tube placement EXAM: PORTABLE CHEST 1 VIEW COMPARISON:  October 10, 2019 FINDINGS: The heart size and mediastinal contours are unchanged. Overlying median sternotomy wires and surgical clips. Interval placement of a right-sided pigtail catheter is seen. No definite residual pneumothorax is seen. The left lung is clear. No acute osseous abnormality. IMPRESSION: Interval placement of right-sided chest tube with no definite residual pneumothorax. Electronically Signed   By: Prudencio Pair M.D.   On: 10/11/2019 11:48    Anti-infectives: Anti-infectives (From admission, onward)   None       Assessment/Plan Fall Right PTX- CXR this am without residual PTX. CT removed. Post removal CXR. Mobilize, pulm toilet  HTN - home meds  HLD - home meds  CAD s/p CABG DM2 (A1c 6.7 on 03/09/19) - Reports diet controlled, no meds. Glucose 119-154 on  BMPs Prior TIA FEN -Regular VTE -SCDs, Lovenox ID -None Plan:CT removed. Follow up xray. If stable, discharge later this morning.     LOS: 2 days    Jillyn Ledger , [facility] 10/13/2019, 7:52 AM Please see Amion for pager number during day hours 7:00am-4:30pm

## 2019-10-13 NOTE — Discharge Summary (Signed)
Patient ID: Tyler Guerra [id number] 02-28-1951 69 y.o.  Admit date: 10/11/2019 Discharge date: 10/13/2019  Admitting Diagnosis: Fall Right PTX  HTN HLD CAD s/p CABG DM2 Prior TIA  Discharge Diagnosis Patient Active Problem List   Diagnosis Date Noted  . Pneumothorax 10/11/2019  . Chest wall pain 10/11/2019  . 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 Knee pain 03/13/2019  . Left shoulder pain 09/04/2018  . Diabetes mellitus without complication ([place]) 123456  . Hand weakness 03/02/2018  . Depression 01/26/2017  . Aphasia 01/26/2017  . Hypokalemia 01/26/2017  . Hyperglycemia 05/17/2016  . Anxiety state 03/16/2016  . TIA (transient ischemic attack) 10/14/2015  . Rash and nonspecific skin eruption 10/14/2015  . Skin lesion 10/14/2015  . BPV (benign positional vertigo) 11/02/2014  . Advance care planning 05/13/2014  . 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 Hearing loss 04/21/2013  . Ventral hernia 10/26/2011  . Medicare annual wellness visit, subsequent 10/26/2011  . Prostate cancer ([place]) 10/26/2011  . Coronary atherosclerosis 07/19/2010  . GERD 07/19/2010  . Hyperlipidemia 07/18/2010  . Gout 07/18/2010  . HYPERTENSION, BENIGN ESSENTIAL 07/18/2010    Consultants None   Procedures Chest Tube Placement - Dr. Grandville Silos - 10/11/2019  Hospital Course:   Tyler Guerra. is a 69 y.o. male with a history of HTN, HLD, CAD s/p CABG, DM2, and prior TIA who presented as a nonlevel trauma after a fall that occurred on Sunday night.  Patient reports that late Sunday, 3/7, he accidentally stepped on his dog while walking across the room causing him to lose his balance and fall onto the corner of a chest at the end of his bed.  He notes that he struck the right side of his chest wall.  He denies any head trauma or loss of consciousness.  He thought he may have had a rib fracture so he tried to stick it out at home with oral medications without any relief.  He has  some shortness of breath at night. None currently. He saw his primary care provider who ordered a chest x-ray yesterday.  This showed a moderate right pneumothorax.  He was advised to go to the emergency department.  Trauma surgery was asked to admit.  Patient denies any other areas of pain.  Chest tube was placed as noted above. Serial chest xrays were monitored and once chest output decreased and pneumothorax improved the chest tube was removed. On 3/12, CXR after removal of CT showed no residual PTX, the patient was voiding well, tolerating diet, ambulating well, pain well controlled, vital signs stable, CT dressing c/d/i and felt stable for discharge home. Follow up arranged as below.   Allergies as of 10/13/2019   No Known Allergies     Medication List    STOP taking these medications   HYDROcodone-acetaminophen 5-325 MG tablet Commonly known as: NORCO/VICODIN     TAKE these medications   acetaminophen 325 MG tablet Commonly known as: TYLENOL Take 2 tablets (650 mg total) by mouth every 6 (six) hours as needed for mild pain.   allopurinol 300 MG tablet Commonly known as: ZYLOPRIM Take 1 tablet (300 mg total) by mouth daily.   aspirin EC 81 MG tablet Take 1 tablet (81 mg total) by mouth daily.   atorvastatin 40 MG tablet Commonly known as: LIPITOR Take 1 tablet (40 mg total) by mouth daily.   BLACK CHERRY CONCENTRATE PO Take by mouth at bedtime.   escitalopram 5 MG tablet Commonly  known as: LEXAPRO Take 1 tablet (5 mg total) by mouth daily.   hydrochlorothiazide 25 MG tablet Commonly known as: HYDRODIURIL Take 1 tablet (25 mg total) by mouth daily.   NexIUM 20 MG capsule Generic drug: esomeprazole Take 20 mg by mouth daily at 12 noon.   OCUVITE PO Take 1 capsule by mouth daily.   oxyCODONE 5 MG immediate release tablet Commonly known as: Oxy IR/ROXICODONE Take 1 tablet (5 mg total) by mouth every 6 (six) hours as needed for breakthrough pain.   sodium chloride  0.65 % Soln nasal spray Commonly known as: OCEAN Place 1 spray into both nostrils as needed for congestion.   Systane Balance 0.6 % Soln Generic drug: Propylene Glycol Place 1 drop into both eyes daily as needed (dry eyes).   tamsulosin 0.4 MG Caps capsule Commonly known as: FLOMAX Take 1 capsule (0.4 mg total) by mouth 2 (two) times daily.   VITAMIN D PO Take 1 capsule by mouth 2 (two) times daily.        Follow-up Information    Tonia Ghent, MD. Go on 10/17/2019.   Specialty: Family Medicine Why: 10:30am Hospital follow up Contact information: [place] [address] [phone]        [place]. Go on 10/26/2019.   Why: 920am. Please arrive 30 minutes prior to your appointment for paperwork. Please bring a copy of your photo ID and insurance card.  Contact information: [place] [phone] [place]. Go on 10/25/2019.   Why: For a chest xray the day before your appointment with the trauma clinic.  Contact information: [place] [address] [phone]           Signed: Alferd Apa, [facility] 10/13/2019, 10:54 AM Please see Amion for pager number during day hours 7:00am-4:30pm

## 2019-10-13 NOTE — Telephone Encounter (Signed)
Noted. Thanks.

## 2019-10-13 NOTE — Plan of Care (Signed)
  Problem: Education: Goal: Knowledge of General Education information will improve Description: Including pain rating scale, medication(s)/side effects and non-pharmacologic comfort measures Outcome: Progressing   Problem: Activity: Goal: Risk for activity intolerance will decrease Outcome: Progressing   Problem: Nutrition: Goal: Adequate nutrition will be maintained Outcome: Not Applicable   Problem: Pain Managment: Goal: General experience of comfort will improve Outcome: Progressing

## 2019-10-13 NOTE — Telephone Encounter (Signed)
Transition Care Management Follow-up Telephone Call  Date of discharge and from where: 10/13/2019, [facility]  How have you been since you were released from the hospital? Patient states that is doing doing very well. No pain or shortness of breath noted.  Any questions or concerns? No   Items Reviewed:  Did the pt receive and understand the discharge instructions provided? Yes   Medications obtained and verified? Yes   Any new allergies since your discharge? No   Dietary orders reviewed? Yes  Do you have support at home? Yes   Functional Questionnaire: (I = Independent and D = Dependent) ADLs: I  Bathing/Dressing- I  Meal Prep- I  Eating- I  Maintaining continence- I  Transferring/Ambulation- I  Managing Meds- I  Follow up appointments reviewed:   PCP Hospital f/u appt confirmed? Yes  Scheduled to see Dr. [name] on 10/17/2019 @ 10:30 am.  [facility] f/u appt confirmed? N/A   Are transportation arrangements needed? No   If their condition worsens, is the pt aware to call PCP or go to the Emergency Dept.? Yes  Was the patient provided with contact information for the PCP's office or ED? Yes  Was to pt encouraged to call back with questions or concerns? Yes

## 2019-10-13 NOTE — TOC Transition Note (Signed)
Transition of Care [facility]) - CM/SW Discharge Note   Patient Details  Name: Tyler Guerra. MRN: [id number] Date of Birth: [birth year]  [facility] CM/SW Contact:  Ella Bodo, RN Phone Number: 10/13/2019, 11:46 AM   Clinical Narrative: Pt medically stable for discharge home today with spouse.  No discharge needs identified.  Follow up appt made with PCP.        Barriers to Discharge: Continued Medical Work up   Patient Goals and CMS Choice Patient states their goals for this hospitalization and ongoing recovery are:: to go home tomorrow         Discharge Planning Services: CM Consult                                 Social Determinants of Health (SDOH) Interventions     Readmission Risk Interventions Readmission Risk Prevention Plan 10/13/2019  Transportation Screening Complete  PCP or Specialist Appt within 5-7 Days Complete  Home Care Screening Complete  Medication Review (RN CM) Complete  Some recent data might be hidden   Reinaldo Raddle, RN, BSN  Trauma/Neuro ICU Case Manager [phone]

## 2019-10-17 ENCOUNTER — Inpatient Hospital Stay: Payer: Medicare Other | Admitting: Family Medicine

## 2019-10-18 ENCOUNTER — Ambulatory Visit: Payer: Medicare Other | Attending: Internal Medicine

## 2019-10-18 DIAGNOSIS — Z23 Encounter for immunization: Secondary | ICD-10-CM

## 2019-10-18 NOTE — Progress Notes (Signed)
   [id number]  Name:  Jamiyl Flight.    MRN: [id number] DOB: 04-22-1951  10/18/2019  Mr. Dowsett was observed post Covid-19 immunization for 15 minutes without incident. He was provided with Vaccine Information Sheet and instruction to access the V-Safe system.   Mr. Kappel was instructed to call 911 with any severe reactions post vaccine: [name] Difficulty breathing  . Swelling of face and throat  . A fast heartbeat  . A bad rash all over body  . Dizziness and weakness   Immunizations Administered    Name Date Dose VIS Date Route   Pfizer COVID-19 Vaccine 10/18/2019 12:07 PM 0.3 mL 07/14/2019 Intramuscular   Manufacturer: [place]   Lot: [id number]   [employer]: [id number]

## 2019-10-25 ENCOUNTER — Ambulatory Visit
Admission: RE | Admit: 2019-10-25 | Discharge: 2019-10-25 | Disposition: A | Discharge status: Home / Self Care | Payer: Medicare Other | Source: Ambulatory Visit | Attending: Physician Assistant | Admitting: Physician Assistant

## 2019-10-25 ENCOUNTER — Other Ambulatory Visit: Payer: Self-pay | Admitting: Physician Assistant

## 2019-10-25 DIAGNOSIS — J939 Pneumothorax, unspecified: Secondary | ICD-10-CM

## 2019-10-26 DIAGNOSIS — Z09 Encounter for follow-up examination after completed treatment for conditions other than malignant neoplasm: Secondary | ICD-10-CM | POA: Diagnosis not present

## 2019-10-30 ENCOUNTER — Other Ambulatory Visit: Payer: Self-pay

## 2019-10-30 ENCOUNTER — Ambulatory Visit (INDEPENDENT_AMBULATORY_CARE_PROVIDER_SITE_OTHER): Payer: Medicare Other | Admitting: Family Medicine

## 2019-10-30 ENCOUNTER — Encounter: Payer: Self-pay | Admitting: Family Medicine

## 2019-10-30 VITALS — BP 108/64 | HR 90 | Temp 97.3°F | Ht 69.0 in | Wt 217.2 lb

## 2019-10-30 DIAGNOSIS — S270XXD Traumatic pneumothorax, subsequent encounter: Secondary | ICD-10-CM

## 2019-10-30 DIAGNOSIS — D649 Anemia, unspecified: Secondary | ICD-10-CM | POA: Diagnosis not present

## 2019-10-30 LAB — CBC WITH DIFFERENTIAL/PLATELET
Basophils Absolute: 0 10*3/uL (ref 0.0–0.1)
Basophils Relative: 0.3 % (ref 0.0–3.0)
Eosinophils Absolute: 0.1 10*3/uL (ref 0.0–0.7)
Eosinophils Relative: 2.3 % (ref 0.0–5.0)
HCT: 40.3 % (ref 39.0–52.0)
Hemoglobin: 13.6 g/dL (ref 13.0–17.0)
Lymphocytes Relative: 35.3 % (ref 12.0–46.0)
Lymphs Abs: 2 10*3/uL (ref 0.7–4.0)
MCHC: 33.8 g/dL (ref 30.0–36.0)
MCV: 89.5 fl (ref 78.0–100.0)
Monocytes Absolute: 0.3 10*3/uL (ref 0.1–1.0)
Monocytes Relative: 6.1 % (ref 3.0–12.0)
Neutro Abs: 3.1 10*3/uL (ref 1.4–7.7)
Neutrophils Relative %: 56 % (ref 43.0–77.0)
Platelets: 192 10*3/uL (ref 150.0–400.0)
RBC: 4.51 Mil/uL (ref 4.22–5.81)
RDW: 14.2 % (ref 11.5–15.5)
WBC: 5.6 10*3/uL (ref 4.0–10.5)

## 2019-10-30 NOTE — Progress Notes (Signed)
This visit occurred during the SARS-CoV-2 public health emergency.  Safety protocols were in place, including screening questions prior to the visit, additional usage of staff PPE, and extensive cleaning of exam room while observing appropriate contact time as indicated for disinfecting solutions.  Inpatient f/u for PTX.  He had accidentally hit his chest wall when he tripped.  He was having pain and got evaluated here in the clinic.  X-ray showed pneumothorax.  He was admitted and had chest tube placed.  He clearly felt better in the meantime.  He had an uneventful inpatient course and was able to be discharged.  He is here for follow-up today.  He hugs himself with a sneeze but he is clearly better in the meantime.  He hasn't used oxycodone recently.  Now his pain is 1-2/10.  Can take a deep breath.  No cough except some clearing some sputum (not discolored) in the AM.  No fevers.  Back pain resolved.    Meds, vitals, and allergies reviewed.   ROS: Per HPI unless specifically indicated in ROS section   nad ncat Neck supple, no LA rrr ctab Healed chest tube site R upper chest.  Abdomen soft. Extremities well perfused. No BLE edema.

## 2019-10-30 NOTE — Patient Instructions (Signed)
Go to the lab on the way out.   If you have mychart we'll likely use that to update you.    °Take care.  Glad to see you. °Update me as needed.   °

## 2019-11-01 NOTE — Assessment & Plan Note (Signed)
He had normal follow-up chest x-ray in the meantime.  I showed him his old and more recent x-rays.  He has equal breath sounds.  His pain is clearly better.  He can take a deep breath.  He appears to be healing normally.  Pneumothorax pathophysiology discussed with patient.  He will update me as needed.  Okay for outpatient follow-up.  I thank all involved.

## 2020-03-24 ENCOUNTER — Other Ambulatory Visit: Payer: Self-pay | Admitting: Family Medicine

## 2020-04-22 ENCOUNTER — Other Ambulatory Visit: Payer: Self-pay | Admitting: Family Medicine

## 2020-05-18 ENCOUNTER — Ambulatory Visit (INDEPENDENT_AMBULATORY_CARE_PROVIDER_SITE_OTHER): Payer: Medicare Other

## 2020-05-18 ENCOUNTER — Other Ambulatory Visit: Payer: Self-pay

## 2020-05-18 DIAGNOSIS — Z23 Encounter for immunization: Secondary | ICD-10-CM

## 2020-06-24 ENCOUNTER — Other Ambulatory Visit: Payer: Self-pay | Admitting: Family Medicine

## 2020-06-24 NOTE — Telephone Encounter (Signed)
Please call patient and schedule annual physical. 

## 2020-07-23 DIAGNOSIS — Z23 Encounter for immunization: Secondary | ICD-10-CM | POA: Diagnosis not present

## 2020-09-18 ENCOUNTER — Other Ambulatory Visit: Payer: Self-pay | Admitting: Family Medicine

## 2020-09-18 DIAGNOSIS — M109 Gout, unspecified: Secondary | ICD-10-CM

## 2020-09-18 DIAGNOSIS — E119 Type 2 diabetes mellitus without complications: Secondary | ICD-10-CM

## 2020-09-23 ENCOUNTER — Other Ambulatory Visit: Payer: Self-pay | Admitting: Family Medicine

## 2020-10-01 ENCOUNTER — Other Ambulatory Visit (INDEPENDENT_AMBULATORY_CARE_PROVIDER_SITE_OTHER): Payer: Medicare Other

## 2020-10-01 ENCOUNTER — Other Ambulatory Visit: Payer: Self-pay | Admitting: Family Medicine

## 2020-10-01 ENCOUNTER — Other Ambulatory Visit: Payer: Self-pay

## 2020-10-01 DIAGNOSIS — M109 Gout, unspecified: Secondary | ICD-10-CM

## 2020-10-01 DIAGNOSIS — E119 Type 2 diabetes mellitus without complications: Secondary | ICD-10-CM

## 2020-10-01 LAB — COMPREHENSIVE METABOLIC PANEL
ALT: 23 U/L (ref 0–53)
AST: 19 U/L (ref 0–37)
Albumin: 4.2 g/dL (ref 3.5–5.2)
Alkaline Phosphatase: 100 U/L (ref 39–117)
BUN: 19 mg/dL (ref 6–23)
CO2: 32 mEq/L (ref 19–32)
Calcium: 9.8 mg/dL (ref 8.4–10.5)
Chloride: 102 mEq/L (ref 96–112)
Creatinine, Ser: 1.01 mg/dL (ref 0.40–1.50)
GFR: 76.28 mL/min (ref >60.00)
Glucose, Bld: 162 mg/dL — ABNORMAL HIGH (ref 70–99)
Potassium: 3.9 mEq/L (ref 3.5–5.1)
Sodium: 139 mEq/L (ref 135–145)
Total Bilirubin: 0.8 mg/dL (ref 0.2–1.2)
Total Protein: 7.2 g/dL (ref 6.0–8.3)

## 2020-10-01 LAB — LIPID PANEL
Cholesterol: 183 mg/dL (ref 0–200)
HDL: 47.4 mg/dL (ref >39.00)
NonHDL: 135.76
Total CHOL/HDL Ratio: 4
Triglycerides: 230 mg/dL — ABNORMAL HIGH (ref 0.0–149.0)
VLDL: 46 mg/dL — ABNORMAL HIGH (ref 0.0–40.0)

## 2020-10-01 LAB — MICROALBUMIN / CREATININE URINE RATIO
Creatinine,U: 216.2 mg/dL
Microalb Creat Ratio: 0.3 mg/g (ref 0.0–30.0)
Microalb, Ur: 0.7 mg/dL (ref 0.0–1.9)

## 2020-10-01 LAB — LDL CHOLESTEROL, DIRECT: Direct LDL: 97 mg/dL

## 2020-10-01 LAB — URIC ACID: Uric Acid, Serum: 4.5 mg/dL (ref 4.0–7.8)

## 2020-10-01 LAB — HEMOGLOBIN A1C: Hgb A1c MFr Bld: 6.8 % — ABNORMAL HIGH (ref 4.6–6.5)

## 2020-10-03 ENCOUNTER — Encounter: Payer: Self-pay | Admitting: Family Medicine

## 2020-10-03 ENCOUNTER — Ambulatory Visit (INDEPENDENT_AMBULATORY_CARE_PROVIDER_SITE_OTHER): Payer: Medicare Other | Admitting: Family Medicine

## 2020-10-03 ENCOUNTER — Other Ambulatory Visit: Payer: Self-pay

## 2020-10-03 VITALS — BP 132/84 | HR 60 | Temp 97.1°F | Ht 69.0 in | Wt 220.0 lb

## 2020-10-03 DIAGNOSIS — Z0000 Encounter for general adult medical examination without abnormal findings: Secondary | ICD-10-CM

## 2020-10-03 DIAGNOSIS — F411 Generalized anxiety disorder: Secondary | ICD-10-CM

## 2020-10-03 DIAGNOSIS — I10 Essential (primary) hypertension: Secondary | ICD-10-CM

## 2020-10-03 DIAGNOSIS — M109 Gout, unspecified: Secondary | ICD-10-CM | POA: Diagnosis not present

## 2020-10-03 DIAGNOSIS — E119 Type 2 diabetes mellitus without complications: Secondary | ICD-10-CM | POA: Diagnosis not present

## 2020-10-03 DIAGNOSIS — E785 Hyperlipidemia, unspecified: Secondary | ICD-10-CM

## 2020-10-03 DIAGNOSIS — Z7189 Other specified counseling: Secondary | ICD-10-CM

## 2020-10-03 NOTE — Progress Notes (Signed)
This visit occurred during the SARS-CoV-2 public health emergency.  Safety protocols were in place, including screening questions prior to the visit, additional usage of staff PPE, and extensive cleaning of exam room while observing appropriate contact time as indicated for disinfecting solutions.  I have personally reviewed the Medicare Annual Wellness questionnaire and have noted 1. The patient's medical and social history 2. Their use of alcohol, tobacco or illicit drugs 3. Their current medications and supplements 4. The patient's functional ability including ADL's, fall risks, home safety risks and hearing or visual             impairment. 5. Diet and physical activities 6. Evidence for depression or mood disorders  The patients weight, height, BMI have been recorded in the chart and visual acuity is per eye clinic.  I have made referrals, counseling and provided education to the patient based review of the above and I have provided the pt with a written personalized care plan for preventive services.  Provider list updated- see scanned forms.  Routine anticipatory guidance given to patient.  See health maintenance. The possibility exists that previously documented standard health maintenance information may have been brought forward from a previous encounter into this note.  If needed, that same information has been updated to reflect the current situation based on today's encounter.    Flu 2021 Shingles discussed with patient PNA up-to-date Tetanus 2017 COVID vaccine up-to-date. Colonoscopy 2018 Prostate cancer screening 2022 per urology.  I will defer.  He agrees Advance directive-wife designated patient were incapacitated. Cognitive function addressed- see scanned forms- and if abnormal then additional documentation follows.   Mood is still good.  No ADE on med.  Bright affect.  Compliant with Lexapro.  He wanted to continue  No recent gout flares.  Still on allopurinol.   Compliant.  No adverse effect on medication.  Discussed.  Lipids d/w pt.   CAD, s/p CABG.   Using medication without problems or lightheadedness:  yes Chest pain with exertion: no Edema:no Short of breath:no  Elevated Cholesterol: Using medications without problems: see below.   Muscle aches: not from statin Diet compliance: yes Exercise: yes He had been missing doses of statin.  D/w pt.  Asked him to restart.    Diabetes:  No meds.  Labs d/w pt Hypoglycemic episodes: no, cautions d/w pt.   Hyperglycemic episodes: no Feet problems: no Blood Sugars averaging: not checked.  eye exam within last year: d/w pt.   Prostate cancer per uro.  I'll defer.  He agrees.    PMH and SH reviewed  Meds, vitals, and allergies reviewed.   ROS: Per HPI.  Unless specifically indicated otherwise in HPI, the patient denies:  General: fever. Eyes: acute vision changes ENT: sore throat Cardiovascular: chest pain Respiratory: SOB GI: vomiting GU: dysuria Musculoskeletal: acute back pain Derm: acute rash Neuro: acute motor dysfunction Psych: worsening mood Endocrine: polydipsia Heme: bleeding Allergy: hayfever  GEN: nad, alert and oriented HEENT: NCAT NECK: supple w/o LA CV: rrr. PULM: ctab, no inc wob ABD: soft, +bs EXT: no edema SKIN: no acute rash  Diabetic foot exam: Normal inspection No skin breakdown No calluses  Normal DP pulses Normal sensation to light touch and monofilament Nails normal

## 2020-10-03 NOTE — Patient Instructions (Signed)
I would restart atorvastatin.  Update me as needed.  Thanks for your effort.  Plan on recheck in about 6 months. A1c at the visit.  You don't need to fast.

## 2020-10-05 NOTE — Assessment & Plan Note (Signed)
Labs discussed with patient.  Advised him to restart statin.  Rationale discussed with patient.  He agrees.  He will update me as needed.

## 2020-10-05 NOTE — Assessment & Plan Note (Signed)
Mood is still good.  No ADE on med.  Bright affect.  Compliant with Lexapro.  He wanted to continue would continue Lexapro.  He agrees.

## 2020-10-05 NOTE — Assessment & Plan Note (Signed)
A1c controlled.  No medications.  Continue to work on diet and exercise.  Labs discussed patient.  He agrees.

## 2020-10-05 NOTE — Assessment & Plan Note (Addendum)
No recent gout flares.  Still on allopurinol.  Compliant.  No adverse effect on medication.  Discussed.  Continue allopurinol.

## 2020-10-05 NOTE — Assessment & Plan Note (Signed)
Blood pressure controlled.  Continue hydrochlorothiazide.  He will update me as needed.  Labs discussed with patient.  Continue work on diet and exercise.

## 2020-10-05 NOTE — Assessment & Plan Note (Signed)
Flu 2021 Shingles discussed with patient PNA up-to-date Tetanus 2017 COVID vaccine up-to-date. Colonoscopy 2018 Prostate cancer screening 2022 per urology.  I will defer.  He agrees Advance directive-wife designated patient were incapacitated. Cognitive function addressed- see scanned forms- and if abnormal then additional documentation follows.

## 2020-10-05 NOTE — Assessment & Plan Note (Signed)
Advance directive-wife designated patient were incapacitated. 

## 2020-10-12 NOTE — Progress Notes (Unsigned)
Cardiology Office Note:    Date:  10/17/2020   ID:  [name], DOB [birth year], MRN [id number]  PCP:  Tonia Ghent, MD  Cardiologist:  Sinclair Grooms, MD   Referring MD: Tonia Ghent, MD   Chief Complaint  Patient presents with  . Coronary Artery Disease  . Hypertension    History of Present Illness:    Tyler Wymer. is a 70 y.o. male with a hx of CABG 2000 (LIMA--> LAD; SVG RCA, and Free radial to OM), hypertension, DM II, Hyperlipidemia, hypertension, and h/o TIA.  He is doing well without cardiac complaints.  He has not had angina.  He denies orthopnea and PND.  There is no peripheral edema.  He denies palpitations, syncope, and inability to exercise.  Past Medical History:  Diagnosis Date  . Anxiety state 03/16/2016  . Aphasia 01/26/2017  . Benign localized prostatic hyperplasia with lower urinary tract symptoms (LUTS)   . BPV (benign positional vertigo) 11/02/2014  . Carotid artery stenosis    PER DUPLEX 10-22-2015  BILATERAL ICA 1-39%  . Coronary artery disease    CARDIOLOGIST-  DR Daneen Schick--  VISIT EVERY OTHER YEAR-- [place] 2015  . Coronary atherosclerosis 07/19/2010   Qualifier: Diagnosis of  By: [name] MD, Phillip Heal    . Depression 01/26/2017  . Diabetes mellitus without complication ([place]) 03/11/9832  . GERD 07/19/2010   Qualifier: Diagnosis of  By: [name] MD, Phillip Heal    . GERD (gastroesophageal reflux disease)   . 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 Gout   . Gout 07/18/2010   Qualifier: Diagnosis of  By: [name] MD, Phillip Heal    . H/O eye injury    chronic changes to left eye after injury  . Hand weakness 03/02/2018  . Hearing loss 04/21/2013  . History of TIA (transient ischemic attack)    03/ 2017  no residual after brief episode loss peripheral vision  . HOH (hard of hearing)   . Hyperglycemia 05/17/2016  . Hyperlipemia   . Hyperlipidemia 07/18/2010   Qualifier: Diagnosis of  By: [name] MD, Phillip Heal    . Hypertension   . HYPERTENSION, BENIGN ESSENTIAL  07/18/2010   Qualifier: Diagnosis of  By: [name] MD, Phillip Heal    . Hypokalemia 01/26/2017  . Knee pain 03/13/2019  . Left shoulder pain 09/04/2018  . Pneumothorax 10/11/2019   RIGHT   . Prostate cancer ([place]) UROLOGIST-  DR GRAPEY/  ONCOLOGIST-  DR MANNING   dx 2015--- Stage T1c, Gleason 3+4, PSA 4.03, vol 44cc  . PVC's (premature ventricular contractions)   . Rash and nonspecific skin eruption 10/14/2015  . 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 Skin lesion 10/14/2015  . TIA (transient ischemic attack) 10/14/2015  . Ventral hernia 10/26/2011  . Wears glasses     Past Surgical History:  Procedure Laterality Date  . APPENDECTOMY  1975  . CARDIAC CATHETERIZATION  12-22-2001   dr Daneen Schick   severe native vessel disease mLAD 60-70%,  total occulsion pCFX  and pRCA/  widely patent saphenous vein , free radial , and LIMA grafts/  minminal lv dysfunction, ef 60%  . CHEST TUBE INSERTION Right 10/11/2019  . CORONARY ARTERY BYPASS GRAFT  May 2000   LIMA to LAD,  SVG to PDA and Diagonal, Free radial graft to OM  . Exericse treadmill test  last one 01-03-2014  dr Daneen Schick   normal exercise  tolerance w/ hypertensive repsonse,  no ischemic EKG changes, appropriate HR response & recovery ([facility] TM score 9;  Low Risk , PVC's w/ exertion)  . PROSTATE BIOPSY    . RADIOACTIVE SEED IMPLANT N/A 01/13/2016   Procedure: RADIOACTIVE SEED IMPLANT/BRACHYTHERAPY IMPLANT;  Surgeon: Rana Snare, MD;  Location: [facility];  Service: Urology;  Laterality: N/A;    Current Medications: Current Meds  Medication Sig  . allopurinol (ZYLOPRIM) 300 MG tablet TAKE 1 TABLET DAILY  . aspirin EC 81 MG tablet Take 1 tablet (81 mg total) by mouth daily.  [name] atorvastatin (LIPITOR) 40 MG tablet TAKE 1 TABLET DAILY  . Cholecalciferol (VITAMIN D PO) Take 1 capsule by mouth 2 (two) times daily.   [name] escitalopram (LEXAPRO) 5 MG tablet TAKE 1 TABLET DAILY  . esomeprazole  (NEXIUM) 20 MG capsule Take 20 mg by mouth daily at 12 noon.  . hydrochlorothiazide (HYDRODIURIL) 25 MG tablet TAKE 1 TABLET DAILY  . Misc Natural Products (BLACK CHERRY CONCENTRATE PO) Take by mouth at bedtime.  . Multiple Vitamins-Minerals (OCUVITE PO) Take 1 capsule by mouth daily.  [name] Propylene Glycol (SYSTANE BALANCE) 0.6 % SOLN Place 1 drop into both eyes daily as needed (dry eyes).  . sodium chloride (OCEAN) 0.65 % SOLN nasal spray Place 1 spray into both nostrils as needed for congestion.  . tamsulosin (FLOMAX) 0.4 MG CAPS capsule TAKE 1 CAPSULE TWICE A DAY     Allergies:   Patient has no known allergies.   Social History   Socioeconomic History  . Marital status: Married    Spouse name: Not on file  . Number of children: 2  . Years of education: Not on file  . Highest education level: Not on file  Occupational History  . 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 Smoking status: Never Smoker  . Smokeless tobacco: Never Used  Vaping Use  . Vaping Use: Never used  Substance and Sexual Activity  . Alcohol use: No  . Drug use: No  . Sexual activity: Yes  Other Topics Concern  . Not on file  Social History Narrative   Married 1974   2 daughters and 2 grandkids   Retired 2018.   Social Determinants of Health   Financial Resource Strain: Not on file  Food Insecurity: Not on file  Transportation Needs: Not on file  Physical Activity: Not on file  Stress: Not on file  Social Connections: Not on file     Family History: The patient's family history includes Cancer in his father, paternal uncle, and sister; Dementia in his mother; Heart disease in his father; Prostate cancer in his father; Stroke in his father. There is no history of Colon cancer.  ROS:   Please see the history of present illness.    Doing well.  Resting well all other systems reviewed and are  negative.  EKGs/Labs/Other Studies Reviewed:    The following studies were reviewed today: Exercise treadmill test 06/21/2019: Study Highlights    Blood pressure demonstrated a normal response to exercise.  Downsloping ST segment depression ST segment depression of 1.5 mm was noted during stress in the V5, aVF and III leads, beginning at 5 minutes of stress, and returning to baseline after 5-9 minutes of recovery.   Abnormal ECG stress test, concerning for coronary insufficiency. These findings are less specific in a patient with previous revascularization.  EKG:  EKG sinus rhythm, atrial premature beats, otherwise unremarkable.  When compared to 2020, no change in prior inferior infarction pattern.  PACs are new.  Recent Labs: 10/30/2019: Hemoglobin 13.6; Platelets 192.0 10/01/2020: ALT 23; BUN 19; Creatinine, Ser 1.01; Potassium 3.9; Sodium 139  Recent Lipid Panel    Component Value Date/Time   CHOL 183 10/01/2020 0916   TRIG 230.0 (H) 10/01/2020 0916   HDL 47.40 10/01/2020 0916   CHOLHDL 4 10/01/2020 0916   VLDL 46.0 (H) 10/01/2020 0916   LDLCALC 66 03/09/2019 1259   LDLDIRECT 97.0 10/01/2020 0916    Physical Exam:    VS:  BP 126/70   Pulse 92   Ht 5\' 9"  (1.753 m)   Wt 222 lb 6.4 oz (100.9 kg)   SpO2 97%   BMI 32.84 kg/m     Wt Readings from Last 3 Encounters:  10/17/20 222 lb 6.4 oz (100.9 kg)  10/03/20 220 lb (99.8 kg)  10/30/19 217 lb 3 oz (98.5 kg)     GEN: Overweight. No acute distress HEENT: Normal NECK: No JVD. LYMPHATICS: No lymphadenopathy CARDIAC: No murmur. RRR no gallop, or edema. VASCULAR:  Normal Pulses. No bruits. RESPIRATORY:  Clear to auscultation without rales, wheezing or rhonchi  ABDOMEN: Soft, non-tender, non-distended, No pulsatile mass, MUSCULOSKELETAL: No deformity  SKIN: Warm and dry NEUROLOGIC:  Alert and oriented x 3 PSYCHIATRIC:  Normal affect   ASSESSMENT:    1. S/P CABG (coronary artery bypass graft)   2. HYPERTENSION,  BENIGN ESSENTIAL   3. Hyperlipidemia, unspecified hyperlipidemia type   4. Diabetes mellitus without complication ([place])   5. TIA (transient ischemic attack)    PLAN:    In order of problems listed above:  1. Secondary risk prevention discussed 2. Target blood pressure 130/80 described as target. 3. LDL target less than 70 described as target.  Needs to get back on statin therapy. 4. Hemoglobin A1c less than 7.  Aerobic activity encouraged. 5. No new neurological symptoms.  Overall education and awareness concerning /secondary risk prevention was discussed in detail: LDL less than 70, hemoglobin A1c less than 7, blood pressure target less than 130/80 mmHg, >150 minutes of moderate aerobic activity per week, avoidance of smoking, weight control (via diet and exercise), and continued surveillance/management of/for obstructive sleep apnea.    Medication Adjustments/Labs and Tests Ordered: Current medicines are reviewed at length with the patient today.  Concerns regarding medicines are outlined above.  Orders Placed This Encounter  Procedures  . EKG 12-Lead   No orders of the defined types were placed in this encounter.   There are no Patient Instructions on file for this visit.   Signed, Sinclair Grooms, MD  10/17/2020 4:23 PM    [facility]

## 2020-10-17 ENCOUNTER — Other Ambulatory Visit: Payer: Self-pay

## 2020-10-17 ENCOUNTER — Ambulatory Visit (INDEPENDENT_AMBULATORY_CARE_PROVIDER_SITE_OTHER): Payer: Medicare Other | Admitting: Interventional Cardiology

## 2020-10-17 ENCOUNTER — Encounter: Payer: Self-pay | Admitting: Interventional Cardiology

## 2020-10-17 VITALS — BP 126/70 | HR 92 | Ht 69.0 in | Wt 222.4 lb

## 2020-10-17 DIAGNOSIS — G459 Transient cerebral ischemic attack, unspecified: Secondary | ICD-10-CM

## 2020-10-17 DIAGNOSIS — E119 Type 2 diabetes mellitus without complications: Secondary | ICD-10-CM

## 2020-10-17 DIAGNOSIS — I10 Essential (primary) hypertension: Secondary | ICD-10-CM

## 2020-10-17 DIAGNOSIS — Z951 Presence of aortocoronary bypass graft: Secondary | ICD-10-CM | POA: Diagnosis not present

## 2020-10-17 DIAGNOSIS — E785 Hyperlipidemia, unspecified: Secondary | ICD-10-CM | POA: Diagnosis not present

## 2020-10-17 NOTE — Patient Instructions (Signed)

## 2021-04-08 ENCOUNTER — Encounter: Payer: Self-pay | Admitting: Family Medicine

## 2021-04-08 ENCOUNTER — Other Ambulatory Visit: Payer: Self-pay

## 2021-04-08 ENCOUNTER — Ambulatory Visit (INDEPENDENT_AMBULATORY_CARE_PROVIDER_SITE_OTHER): Payer: Medicare Other | Admitting: Family Medicine

## 2021-04-08 VITALS — BP 118/80 | HR 89 | Temp 97.9°F | Ht 69.0 in | Wt 215.0 lb

## 2021-04-08 DIAGNOSIS — Z23 Encounter for immunization: Secondary | ICD-10-CM | POA: Diagnosis not present

## 2021-04-08 DIAGNOSIS — E119 Type 2 diabetes mellitus without complications: Secondary | ICD-10-CM

## 2021-04-08 LAB — POCT GLYCOSYLATED HEMOGLOBIN (HGB A1C): Hemoglobin A1C: 6.7 % — AB (ref 4.0–5.6)

## 2021-04-08 NOTE — Patient Instructions (Signed)
Flu shot today.  Thanks for your effort.  Plan on recheck in about 6 months with labs prior to a physical  Take care.  Glad to see you.

## 2021-04-08 NOTE — Progress Notes (Signed)
This visit occurred during the SARS-CoV-2 public health emergency.  Safety protocols were in place, including screening questions prior to the visit, additional usage of staff PPE, and extensive cleaning of exam room while observing appropriate contact time as indicated for disinfecting solutions.  Diabetes:  No meds.   Hypoglycemic episodes: no sx Hyperglycemic episodes: no sx Feet problems: no Blood Sugars averaging: not checked.   eye exam within last year: done early 2022, [place].  Down 7 lbs and using 2 smaller belt loops.  Intentional weight loss.  D/w pt.   A1c 6.7.    Flu shot done at office visit.  No CP, not SOB, no BLE edema.    Meds, vitals, and allergies reviewed.   ROS: Per HPI unless specifically indicated in ROS section   GEN: nad, alert and oriented HEENT: ncat NECK: supple w/o LA CV: rrr. PULM: ctab, no inc wob ABD: soft, +bs EXT: no edema SKIN: Well-perfused.

## 2021-04-09 NOTE — Assessment & Plan Note (Signed)
Down 7 lbs and using 2 smaller belt loops.  Intentional weight loss.  D/w pt.   A1c 6.7.  Still diabetic but not needing additional medication.  Continue work on diet and exercise and we can recheck periodically.  See after visit summary.  He agrees with plan.

## 2021-05-24 ENCOUNTER — Emergency Department (HOSPITAL_COMMUNITY): Payer: Medicare Other

## 2021-05-24 ENCOUNTER — Other Ambulatory Visit: Payer: Self-pay

## 2021-05-24 ENCOUNTER — Inpatient Hospital Stay (HOSPITAL_COMMUNITY)
Admission: EM | Admit: 2021-05-24 | Discharge: 2021-05-27 | DRG: 247 | Disposition: A | Discharge status: Home / Self Care | Payer: Medicare Other | Attending: Cardiology | Admitting: Cardiology

## 2021-05-24 DIAGNOSIS — K219 Gastro-esophageal reflux disease without esophagitis: Secondary | ICD-10-CM | POA: Diagnosis present

## 2021-05-24 DIAGNOSIS — Z823 Family history of stroke: Secondary | ICD-10-CM

## 2021-05-24 DIAGNOSIS — Z7982 Long term (current) use of aspirin: Secondary | ICD-10-CM | POA: Diagnosis not present

## 2021-05-24 DIAGNOSIS — F411 Generalized anxiety disorder: Secondary | ICD-10-CM | POA: Diagnosis present

## 2021-05-24 DIAGNOSIS — R7989 Other specified abnormal findings of blood chemistry: Secondary | ICD-10-CM

## 2021-05-24 DIAGNOSIS — I2510 Atherosclerotic heart disease of native coronary artery without angina pectoris: Secondary | ICD-10-CM | POA: Diagnosis present

## 2021-05-24 DIAGNOSIS — Z8042 Family history of malignant neoplasm of prostate: Secondary | ICD-10-CM | POA: Diagnosis not present

## 2021-05-24 DIAGNOSIS — H9190 Unspecified hearing loss, unspecified ear: Secondary | ICD-10-CM | POA: Diagnosis present

## 2021-05-24 DIAGNOSIS — E785 Hyperlipidemia, unspecified: Secondary | ICD-10-CM | POA: Diagnosis present

## 2021-05-24 DIAGNOSIS — I25810 Atherosclerosis of coronary artery bypass graft(s) without angina pectoris: Secondary | ICD-10-CM | POA: Diagnosis present

## 2021-05-24 DIAGNOSIS — Z8546 Personal history of malignant neoplasm of prostate: Secondary | ICD-10-CM

## 2021-05-24 DIAGNOSIS — E876 Hypokalemia: Secondary | ICD-10-CM | POA: Diagnosis present

## 2021-05-24 DIAGNOSIS — I214 Non-ST elevation (NSTEMI) myocardial infarction: Principal | ICD-10-CM

## 2021-05-24 DIAGNOSIS — Z8249 Family history of ischemic heart disease and other diseases of the circulatory system: Secondary | ICD-10-CM | POA: Diagnosis not present

## 2021-05-24 DIAGNOSIS — Z79899 Other long term (current) drug therapy: Secondary | ICD-10-CM

## 2021-05-24 DIAGNOSIS — I25110 Atherosclerotic heart disease of native coronary artery with unstable angina pectoris: Secondary | ICD-10-CM | POA: Diagnosis not present

## 2021-05-24 DIAGNOSIS — Z955 Presence of coronary angioplasty implant and graft: Secondary | ICD-10-CM

## 2021-05-24 DIAGNOSIS — F32A Depression, unspecified: Secondary | ICD-10-CM | POA: Diagnosis present

## 2021-05-24 DIAGNOSIS — I10 Essential (primary) hypertension: Secondary | ICD-10-CM | POA: Diagnosis present

## 2021-05-24 DIAGNOSIS — Z20822 Contact with and (suspected) exposure to covid-19: Secondary | ICD-10-CM | POA: Diagnosis present

## 2021-05-24 DIAGNOSIS — Z8673 Personal history of transient ischemic attack (TIA), and cerebral infarction without residual deficits: Secondary | ICD-10-CM | POA: Diagnosis not present

## 2021-05-24 DIAGNOSIS — E119 Type 2 diabetes mellitus without complications: Secondary | ICD-10-CM | POA: Diagnosis not present

## 2021-05-24 DIAGNOSIS — Z7189 Other specified counseling: Secondary | ICD-10-CM

## 2021-05-24 DIAGNOSIS — R778 Other specified abnormalities of plasma proteins: Secondary | ICD-10-CM | POA: Insufficient documentation

## 2021-05-24 DIAGNOSIS — G459 Transient cerebral ischemic attack, unspecified: Secondary | ICD-10-CM | POA: Diagnosis present

## 2021-05-24 DIAGNOSIS — N400 Enlarged prostate without lower urinary tract symptoms: Secondary | ICD-10-CM | POA: Diagnosis present

## 2021-05-24 DIAGNOSIS — Z8639 Personal history of other endocrine, nutritional and metabolic disease: Secondary | ICD-10-CM

## 2021-05-24 DIAGNOSIS — Z951 Presence of aortocoronary bypass graft: Secondary | ICD-10-CM | POA: Diagnosis not present

## 2021-05-24 DIAGNOSIS — C61 Malignant neoplasm of prostate: Secondary | ICD-10-CM | POA: Diagnosis present

## 2021-05-24 DIAGNOSIS — M109 Gout, unspecified: Secondary | ICD-10-CM | POA: Diagnosis present

## 2021-05-24 DIAGNOSIS — R079 Chest pain, unspecified: Secondary | ICD-10-CM | POA: Diagnosis not present

## 2021-05-24 LAB — CBC WITH DIFFERENTIAL/PLATELET
Abs Immature Granulocytes: 0.03 10*3/uL (ref 0.00–0.07)
Basophils Absolute: 0 10*3/uL (ref 0.0–0.1)
Basophils Relative: 0 %
Eosinophils Absolute: 0.1 10*3/uL (ref 0.0–0.5)
Eosinophils Relative: 1 %
HCT: 40.7 % (ref 39.0–52.0)
Hemoglobin: 13.8 g/dL (ref 13.0–17.0)
Immature Granulocytes: 1 %
Lymphocytes Relative: 25 %
Lymphs Abs: 1.6 10*3/uL (ref 0.7–4.0)
MCH: 30.1 pg (ref 26.0–34.0)
MCHC: 33.9 g/dL (ref 30.0–36.0)
MCV: 88.9 fL (ref 80.0–100.0)
Monocytes Absolute: 0.5 10*3/uL (ref 0.1–1.0)
Monocytes Relative: 7 %
Neutro Abs: 4.2 10*3/uL (ref 1.7–7.7)
Neutrophils Relative %: 66 %
Platelets: 170 10*3/uL (ref 150–400)
RBC: 4.58 MIL/uL (ref 4.22–5.81)
RDW: 12.6 % (ref 11.5–15.5)
WBC: 6.4 10*3/uL (ref 4.0–10.5)
nRBC: 0 % (ref 0.0–0.2)

## 2021-05-24 LAB — RESP PANEL BY RT-PCR (FLU A&B, COVID) ARPGX2
Influenza A by PCR: NEGATIVE
Influenza B by PCR: NEGATIVE
SARS Coronavirus 2 by RT PCR: NEGATIVE

## 2021-05-24 LAB — PROTIME-INR
INR: 1 (ref 0.8–1.2)
Prothrombin Time: 13.1 seconds (ref 11.4–15.2)

## 2021-05-24 LAB — COMPREHENSIVE METABOLIC PANEL
ALT: 23 U/L (ref 0–44)
AST: 30 U/L (ref 15–41)
Albumin: 3.6 g/dL (ref 3.5–5.0)
Alkaline Phosphatase: 95 U/L (ref 38–126)
Anion gap: 8 (ref 5–15)
BUN: 18 mg/dL (ref 8–23)
CO2: 26 mmol/L (ref 22–32)
Calcium: 9.5 mg/dL (ref 8.9–10.3)
Chloride: 106 mmol/L (ref 98–111)
Creatinine, Ser: 1.06 mg/dL (ref 0.61–1.24)
GFR, Estimated: >60 mL/min (ref >60)
Glucose, Bld: 102 mg/dL — ABNORMAL HIGH (ref 70–99)
Potassium: 3.5 mmol/L (ref 3.5–5.1)
Sodium: 140 mmol/L (ref 135–145)
Total Bilirubin: 1 mg/dL (ref 0.3–1.2)
Total Protein: 6.5 g/dL (ref 6.5–8.1)

## 2021-05-24 LAB — TROPONIN I (HIGH SENSITIVITY)
Troponin I (High Sensitivity): 887 ng/L (ref <18)
Troponin I (High Sensitivity): 942 ng/L (ref <18)

## 2021-05-24 LAB — LIPASE, BLOOD: Lipase: 29 U/L (ref 11–51)

## 2021-05-24 MED ORDER — NITROGLYCERIN 0.4 MG SL SUBL: 0.4000 mg | SUBLINGUAL_TABLET | SUBLINGUAL | Status: DC | PRN

## 2021-05-24 MED ORDER — HEPARIN BOLUS VIA INFUSION
4000.0000 [IU] | Freq: Once | INTRAVENOUS | Status: AC
  Administered 2021-05-24: 4000 [IU] via INTRAVENOUS
  Filled 2021-05-24: qty 4000

## 2021-05-24 MED ORDER — LOSARTAN POTASSIUM 25 MG PO TABS
25.0000 mg | ORAL_TABLET | Freq: Every day | ORAL | Status: DC
  Administered 2021-05-25 – 2021-05-27 (×3): 25 mg via ORAL
  Filled 2021-05-24 (×3): qty 1

## 2021-05-24 MED ORDER — ASPIRIN 81 MG PO CHEW
324.0000 mg | CHEWABLE_TABLET | Freq: Once | ORAL | Status: AC
  Administered 2021-05-24: 324 mg via ORAL
  Filled 2021-05-24: qty 4

## 2021-05-24 MED ORDER — NITROGLYCERIN 0.4 MG SL SUBL
0.4000 mg | SUBLINGUAL_TABLET | Freq: Once | SUBLINGUAL | Status: AC
  Administered 2021-05-24: 0.4 mg via SUBLINGUAL
  Filled 2021-05-24: qty 1

## 2021-05-24 MED ORDER — HEPARIN (PORCINE) 25000 UT/250ML-% IV SOLN
1450.0000 [IU]/h | INTRAVENOUS | Status: DC
  Administered 2021-05-24: 1200 [IU]/h via INTRAVENOUS
  Administered 2021-05-25: 1350 [IU]/h via INTRAVENOUS
  Administered 2021-05-26: 1450 [IU]/h via INTRAVENOUS
  Filled 2021-05-24 (×3): qty 250

## 2021-05-24 NOTE — Progress Notes (Signed)
ANTICOAGULATION CONSULT NOTE - Initial Consult  Pharmacy Consult for Heparin Indication: chest pain/ACS  No Known Allergies  Patient Measurements:   Heparin Dosing Weight: 92.9 kg  Vital Signs: Temp: 98.7 F (37.1 C) (10/22 1728) Temp Source: Oral (10/22 1728) BP: 122/77 (10/22 1930) Pulse Rate: 61 (10/22 1930)  Labs: Recent Labs    05/24/21 1814  HGB 13.8  HCT 40.7  PLT 170  CREATININE 1.06  TROPONINIHS 887*    CrCl cannot be calculated (Unknown ideal weight.).   Medical History: Past Medical History:  Diagnosis Date   Anxiety state 03/16/2016   Aphasia 01/26/2017   Benign localized prostatic hyperplasia with lower urinary tract symptoms (LUTS)    BPV (benign positional vertigo) 11/02/2014   Carotid artery stenosis    PER DUPLEX 10-22-2015  BILATERAL ICA 1-39%   Coronary artery disease    CARDIOLOGIST-  DR Daneen Schick--  VISIT EVERY OTHER YEAR-- [place] 2015   Coronary atherosclerosis 07/19/2010   Qualifier: Diagnosis of  By: [name] MD, [place]     Depression 01/26/2017   Diabetes mellitus without complication ([place]) 5/68/1275   GERD 07/19/2010   Qualifier: Diagnosis of  By: [name] MD, Phillip Heal     GERD (gastroesophageal reflux disease)    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   Gout    Gout 07/18/2010   Qualifier: Diagnosis of  By: [name] MD, Phillip Heal     H/O eye injury    chronic changes to left eye after injury   Hand weakness 03/02/2018   Hearing loss 04/21/2013   History of TIA (transient ischemic attack)    03/ 2017  no residual after brief episode loss peripheral vision   HOH (hard of hearing)    Hyperglycemia 05/17/2016   Hyperlipemia    Hyperlipidemia 07/18/2010   Qualifier: Diagnosis of  By: [name] MD, [place]     Hypertension    HYPERTENSION, BENIGN ESSENTIAL 07/18/2010   Qualifier: Diagnosis of  By: [name] MD, [place]     Hypokalemia 01/26/2017   Knee pain 03/13/2019   Left shoulder pain 09/04/2018   Pneumothorax 10/11/2019   RIGHT    Prostate cancer ([place]) UROLOGIST-   DR GRAPEY/  ONCOLOGIST-  DR MANNING   dx 2015--- Stage T1c, Gleason 3+4, PSA 4.03, vol 44cc   PVC's (premature ventricular contractions)    Rash and nonspecific skin eruption 10/14/2015   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   S/P CABG x 05 Dec 1998   Skin lesion 10/14/2015   TIA (transient ischemic attack) 10/14/2015   Ventral hernia 10/26/2011   Wears glasses     Medications:  (Not in a hospital admission)  Scheduled:  Infusions:  PRN: nitroGLYCERIN  Assessment: 50 yom with a history of CAD status post CABG (2000), hypertension, hyperlipidemia, diabetes mellitus, TIA, GERD. Patient is presenting with chest pain. Heparin per pharmacy consult placed for chest pain/ACS.  Patient is on not on anticoagulation prior to arrival.  Hgb 13.8; plt 170  Goal of Therapy:  Heparin level 0.3-0.7 units/ml Monitor platelets by anticoagulation protocol: Yes   Plan:  Give 4000 units bolus x 1 Start heparin infusion at 1200 units/hr Check anti-Xa level in 6-8 hours and daily while on heparin Continue to monitor H&H and platelets  Lorelei Pont, PharmD, BCPS 05/24/2021 8:18 PM ED Clinical Pharmacist -  [phone]

## 2021-05-24 NOTE — ED Provider Notes (Signed)
[facility] EMERGENCY DEPARTMENT Provider Note   CSN: [id number] Arrival date & time: 05/24/21  1718     History Chief Complaint  Patient presents with   Chest Pain    Tyler Guerra. is a 70 y.o. male with a history of CAD status post CABG (2000), hypertension, hyperlipidemia, diabetes mellitus, TIA, GERD.  Patient is followed by cardiologist Dr. Daneen Schick.  Patient presents to the emergency department with a chief complaint of chest pain.  Patient reports that chest pain began yesterday morning.  Chest pain has been constant since then.  Patient reports that pain is across his entire chest.  Today pain radiated into left arm after he exerted himself lifting heavy rocks.  Patient describes pain as a "light pressure and dull pain."  Patient rates pain 2/10 on the pain scale at present.  Pain was worse after lifting heavy rocks.  Patient denies any relief of symptoms after taking 2 Alka-Seltzer last night.  Patient also reports that pain is similar to indigestion and reports increased belching throughout the night.  Patient has had some associated nausea.  No shortness of breath, diaphoresis, vomiting.  Patient also experienced pain across his shoulders intermittently yesterday.  Denies any recent falls or injuries.  No back pain or abdominal pain at present.     Chest Pain Associated symptoms: nausea   Associated symptoms: no abdominal pain, no back pain, no cough, no dizziness, no fever, no headache, no numbness, no palpitations, no shortness of breath, no vomiting and no weakness       Past Medical History:  Diagnosis Date   Anxiety state 03/16/2016   Aphasia 01/26/2017   Benign localized prostatic hyperplasia with lower urinary tract symptoms (LUTS)    BPV (benign positional vertigo) 11/02/2014   Carotid artery stenosis    PER DUPLEX 10-22-2015  BILATERAL ICA 1-39%   Coronary artery disease    CARDIOLOGIST-  DR Daneen Schick--  VISIT EVERY OTHER YEAR-- LOV  2015   Coronary atherosclerosis 07/19/2010   Qualifier: Diagnosis of  By: [name] MD, [place]     Depression 01/26/2017   Diabetes mellitus without complication ([place]) 10/16/1759   GERD 07/19/2010   Qualifier: Diagnosis of  By: [name] MD, Phillip Heal     GERD (gastroesophageal reflux disease)    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   Gout    Gout 07/18/2010   Qualifier: Diagnosis of  By: [name] MD, Phillip Heal     H/O eye injury    chronic changes to left eye after injury   Hand weakness 03/02/2018   Hearing loss 04/21/2013   History of TIA (transient ischemic attack)    03/ 2017  no residual after brief episode loss peripheral vision   HOH (hard of hearing)    Hyperglycemia 05/17/2016   Hyperlipemia    Hyperlipidemia 07/18/2010   Qualifier: Diagnosis of  By: [name] MD, Graham     Hypertension    HYPERTENSION, BENIGN ESSENTIAL 07/18/2010   Qualifier: Diagnosis of  By: [name] MD, Graham     Hypokalemia 01/26/2017   Knee pain 03/13/2019   Left shoulder pain 09/04/2018   Pneumothorax 10/11/2019   RIGHT    Prostate cancer ([place]) UROLOGIST-  DR GRAPEY/  ONCOLOGIST-  DR MANNING   dx 2015--- Stage T1c, Gleason 3+4, PSA 4.03, vol 44cc   PVC's (premature ventricular contractions)    Rash and nonspecific skin eruption 10/14/2015   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   S/P CABG x 05 Dec 1998   Skin lesion 10/14/2015   TIA (transient ischemic attack) 10/14/2015   Ventral hernia 10/26/2011   Wears glasses     Patient Active Problem List   Diagnosis Date Noted   Pneumothorax 10/11/2019   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   Knee pain 03/13/2019   Left shoulder pain 09/04/2018   Diabetes mellitus without complication ([place]) 52/77/8242   Anxiety state 03/16/2016   TIA (transient ischemic attack) 10/14/2015   BPV (benign positional vertigo) 11/02/2014   Advance care planning 05/13/2014   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   Hearing loss 04/21/2013   Ventral hernia 10/26/2011   Medicare  annual wellness visit, subsequent 10/26/2011   Prostate cancer ([place]) 10/26/2011   Coronary atherosclerosis 07/19/2010   GERD 07/19/2010   Hyperlipidemia 07/18/2010   Gout 07/18/2010   HYPERTENSION, BENIGN ESSENTIAL 07/18/2010    Past Surgical History:  Procedure Laterality Date   [place]  12-22-2001   dr Daneen Schick   severe native vessel disease mLAD 60-70%,  total occulsion pCFX  and pRCA/  widely patent saphenous vein , free radial , and LIMA grafts/  minminal lv dysfunction, ef 60%   CHEST TUBE INSERTION Right 10/11/2019   CORONARY ARTERY BYPASS GRAFT  May 2000   LIMA to LAD,  SVG to PDA and Diagonal, Free radial graft to OM   Exericse treadmill test  last one 01-03-2014  dr Daneen Schick   normal exercise tolerance w/ hypertensive repsonse,  no ischemic EKG changes, appropriate HR response & recovery (Duke TM score 9;  Low Risk , PVC's w/ exertion)   PROSTATE BIOPSY     RADIOACTIVE SEED IMPLANT N/A 01/13/2016   Procedure: RADIOACTIVE SEED IMPLANT/BRACHYTHERAPY IMPLANT;  Surgeon: Rana Snare, MD;  Location: [facility];  Service: Urology;  Laterality: N/A;       Family History  Problem Relation Age of Onset   Cancer Father        prostate in eighties   Prostate cancer Father    Heart disease Father    Stroke Father    Dementia Mother    Cancer Sister        breast   Cancer Paternal Uncle        prostate   Colon cancer Neg Hx     Social History   Tobacco Use   Smoking status: Never   Smokeless tobacco: Never  Vaping Use   Vaping Use: Never used  Substance Use Topics   Alcohol use: No   Drug use: No    Home Medications Prior to Admission medications   Medication Sig Start Date End Date Taking? Authorizing Provider  allopurinol (ZYLOPRIM) 300 MG tablet TAKE 1 TABLET DAILY 10/01/20   Tonia Ghent, MD  aspirin EC 81 MG tablet Take 1 tablet (81 mg total) by mouth daily. 06/01/19   Belva Crome, MD   atorvastatin (LIPITOR) 40 MG tablet TAKE 1 TABLET DAILY 10/01/20   Tonia Ghent, MD  Cholecalciferol (VITAMIN D PO) Take 1 capsule by mouth 2 (two) times daily.     [provider]  escitalopram (LEXAPRO) 5 MG tablet TAKE 1 TABLET DAILY 10/01/20   Tonia Ghent, MD  esomeprazole (NEXIUM) 20 MG capsule Take 20 mg by mouth daily at 12 noon.    [provider]  hydrochlorothiazide (HYDRODIURIL) 25 MG tablet TAKE 1 TABLET DAILY 09/23/20   Tonia Ghent, MD  Misc Natural Products ([place]  PO) Take by mouth at bedtime.    [provider]  Multiple Vitamins-Minerals (OCUVITE PO) Take 1 capsule by mouth daily.    [provider]  Propylene Glycol (SYSTANE BALANCE) 0.6 % SOLN Place 1 drop into both eyes daily as needed (dry eyes).    [provider]  sodium chloride (OCEAN) 0.65 % SOLN nasal spray Place 1 spray into both nostrils as needed for congestion.    [provider]  tamsulosin (FLOMAX) 0.4 MG CAPS capsule TAKE 1 CAPSULE TWICE A DAY 10/01/20   Tonia Ghent, MD    Allergies    Patient has no known allergies.  Review of Systems   Review of Systems  Constitutional:  Negative for chills and fever.  Eyes:  Negative for visual disturbance.  Respiratory:  Negative for cough and shortness of breath.   Cardiovascular:  Positive for chest pain. Negative for palpitations and leg swelling.  Gastrointestinal:  Positive for nausea. Negative for abdominal distention, abdominal pain, anal bleeding, blood in stool, constipation, diarrhea, rectal pain and vomiting.  Genitourinary:  Negative for difficulty urinating and dysuria.  Musculoskeletal:  Negative for back pain and neck pain.  Skin:  Negative for color change and rash.  Neurological:  Negative for dizziness, syncope, weakness, light-headedness, numbness and headaches.  Psychiatric/Behavioral:  Negative for confusion.    Physical Exam Updated Vital Signs BP (!) 152/95  (BP Location: Right Arm)   Pulse 78   Temp 98.7 F (37.1 C) (Oral)   Resp 12   SpO2 99%   Physical Exam Vitals and nursing note reviewed.  Constitutional:      General: He is not in acute distress.    Appearance: He is not ill-appearing, toxic-appearing or diaphoretic.  HENT:     Head: Normocephalic.  Eyes:     General: No scleral icterus.       Right eye: No discharge.        Left eye: No discharge.  Cardiovascular:     Rate and Rhythm: Normal rate.     Pulses:          Radial pulses are 2+ on the right side and 2+ on the left side.  Pulmonary:     Effort: Pulmonary effort is normal. No tachypnea, bradypnea or respiratory distress.     Breath sounds: Normal breath sounds. No stridor.  Abdominal:     General: There is no distension. There are no signs of injury.     Palpations: Abdomen is soft. There is no mass or pulsatile mass.     Tenderness: There is no abdominal tenderness. There is no guarding or rebound.     Hernia: There is no hernia in the umbilical area or ventral area.  Musculoskeletal:     Right lower leg: Normal. No swelling or tenderness. No edema.     Left lower leg: Normal. No swelling or tenderness. No edema.  Skin:    General: Skin is warm and dry.  Neurological:     General: No focal deficit present.     Mental Status: He is alert.  Psychiatric:        Behavior: Behavior is cooperative.    ED Results / Procedures / Treatments   Labs (all labs ordered are listed, but only abnormal results are displayed) Labs Reviewed  COMPREHENSIVE METABOLIC PANEL - Abnormal; Notable for the following components:      Result Value   Glucose, Bld 102 (*)    All other components within normal limits  TROPONIN I (HIGH SENSITIVITY) - Abnormal; Notable for the following components:   Troponin I (High Sensitivity) 887 (*)    All other components within normal limits  CBC WITH DIFFERENTIAL/PLATELET  LIPASE, BLOOD  TROPONIN I (HIGH SENSITIVITY)    EKG EKG  Interpretation  Date/Time:  Saturday May 24 2021 17:29:58 EDT Ventricular Rate:  74 PR Interval:  167 QRS Duration: 93 QT Interval:  380 QTC Calculation: 422 R Axis:   44 Text Interpretation: Sinus rhythm no acute ST/Ts No significant change since prior 6/18 Confirmed by Aletta Edouard [phone]) on 05/24/2021 5:34:40 PM  Radiology No results found.  Procedures .Critical Care E&M Performed by: Loni Beckwith, PA-C  Critical care provider statement:    Critical care time (minutes):  45   Critical care was necessary to treat or prevent imminent or life-threatening deterioration of the following conditions:  Cardiac failure   Critical care was time spent personally by me on the following activities:  Blood draw for specimens, development of treatment plan with patient or surrogate, examination of patient, obtaining history from patient or surrogate, evaluation of patient's response to treatment, ordering and performing treatments and interventions, ordering and review of laboratory studies, ordering and review of radiographic studies, pulse oximetry, re-evaluation of patient's condition and review of old charts   Care discussed with: admitting provider   After initial E/M assessment, critical care services were subsequently performed that were exclusive of separately billable procedures or treatment.     Medications Ordered in ED Medications  nitroGLYCERIN (NITROSTAT) SL tablet 0.4 mg (has no administration in time range)  aspirin chewable tablet 324 mg (324 mg Oral Given 05/24/21 1820)  nitroGLYCERIN (NITROSTAT) SL tablet 0.4 mg (0.4 mg Sublingual Given 05/24/21 1820)    ED Course  I have reviewed the triage vital signs and the nursing notes.  Pertinent labs & imaging results that were available during my care of the patient were reviewed by me and considered in my medical decision making (see chart for details).  Clinical Course as of 05/24/21 2009  Sat May 24, 2021   1940 Troponin I (High Sensitivity)(!!): [phone] [PB]  1955 Spoke to cardiologist who will see the patient for admission. [PB]    Clinical Course User Index [PB] Dyann Ruddle   MDM Rules/Calculators/A&P                           Alert 70 year old male in no acute distress, nontoxic-appearing.  Presents to emergency department with chief complaint of chest pain.  Pain started yesterday morning and has been constant since then.  Pain is located throughout entire chest and described as light pressure.  After lifting heavy rocks pain increased and radiated into left arm.  Endorses associated nausea.  Patient has history of CAD status post CABG 2000, hypertension, hyperlipidemia, diabetes mellitus, TIA.  Will initiate ACS work-up at this time.  Patient given 243 mg aspirin as he took 1 baby aspirin earlier this morning.  Will give patient nitro to evaluate for any improvement in his symptoms.  Patient reports improvement in his pain after receiving nitroglycerin.  Currently rates pain 1/10 on the pain scale.  Patient's troponin was noted to be elevated at 887.  We will start patient on heparin drip per pharmacy consult.  We will consult cardiology.  Spoke to Dr. Quentin Ore who will see the patient for admission.  Patient was discussed with and evaluated by Dr. Melina Copa.   Final  Clinical Impression(s) / ED Diagnoses Final diagnoses:  None    Rx / DC Orders ED Discharge Orders     None        Dyann Ruddle 05/24/21 2011    Hayden Rasmussen, MD 05/25/21 1119

## 2021-05-24 NOTE — ED Triage Notes (Addendum)
Pt here POV with c/o of chest pain or as pt states his "chest issues". Pt feels he needs to continuously burp. 5/10 pain. Described as pressure. 81mg  ASA taken at home. Denies vomiting. Some nausea

## 2021-05-24 NOTE — H&P (Signed)
. Cardiology Admission History and Physical:   Patient ID: Tyler Guerra. MRN: [id number] DOB/AGE: [birth year] 70 y.o.  Admit date: 05/24/2021 Primary Physician   Tonia Ghent, MD Primary Cardiologist   Sinclair Grooms, MD Chief Complaint    chest pain  Assessment and Plan:  NSTEMI Known CAD. 3v CABG in 2000 (LIMA-LAD, SVG-RCA, free radial to OM) Hypertension, DM2, hyperlipidemia, TIA, anxiety, GERD  - Heparin gtt, asa 81 mg daily, atorvastatin 80 mg qhs - Cardiac catheterization 10/24, NPO past midnight 10/23 - Metoprolol 12.5 mg bid, losartan 25 mg daily - Lipid panel - Echocardiogram - PRN s/l nitro for chest pain  History of Present Illness:  70 year old male presents with chest pain.  PMH: CAD, 3v CABG in 2000 (LIMA-LAD, SVG-RCA, free radial to OM), hypertension, DM2, hyperlipidemia, TIA, anxiety, GERD  He was doing well up till 2 days ago when he started having intermittent chest pain. He initially had sort of a heart burn and thought was having indigestion. Today, he was doing some heavy work in the yard (lifting heavy objects). Shortly after he had excruciating substernal chest pain radiating to LUE. This prompted ER visit. EKG does not show significant ST-T wave changes. Hs troponin is 887 and 942. Given as a 324 mg once, nitroglycerin sublingual x 1 and started on heparin gtt. Currently chest pain free.  Relevant CV studies:   Echo 2018 Study Conclusions   - Left ventricle: The cavity size was normal. There was mild    concentric hypertrophy. Systolic function was normal. The    estimated ejection fraction was in the range of 55% to 60%. Wall    motion was normal; there were no regional wall motion    abnormalities.  - Aortic valve: Trileaflet; normal thickness, mildly calcified    leaflets. Valve area (VTI): 2.14 cm^2. Valve area (Vmax): 1.96    cm^2. Valve area (Vmean): 2.18 cm^2.   Nuclear stress test 2020 The patient exercised following the [place]  protocol.    The test was stopped because  the patient complained of fatigue.   Blood pressure and heart rate demonstrated a normal response to exercise. Blood pressure demonstrated a normal response to exercise. Overall, the patient's exercise capacity was normal.   85% of maximum heart rate was achieved after 8 minutes.  Recovery time:  5 minutes.  The patient's response to exercise was adequate for diagnosis.  Past Medical History:  Diagnosis Date   Anxiety state 03/16/2016   Aphasia 01/26/2017   Benign localized prostatic hyperplasia with lower urinary tract symptoms (LUTS)    BPV (benign positional vertigo) 11/02/2014   Carotid artery stenosis    PER DUPLEX 10-22-2015  BILATERAL ICA 1-39%   Coronary artery disease    CARDIOLOGIST-  DR Daneen Schick--  VISIT EVERY OTHER YEAR-- LOV 2015   Coronary atherosclerosis 07/19/2010   Qualifier: Diagnosis of  By: [name] MD, [place]     Depression 01/26/2017   Diabetes mellitus without complication ([place]) 3/55/7322   GERD 07/19/2010   Qualifier: Diagnosis of  By: [name] MD, Phillip Heal     GERD (gastroesophageal reflux disease)    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   Gout    Gout 07/18/2010   Qualifier: Diagnosis of  By: [name] MD, Phillip Heal     H/O eye injury    chronic changes to left eye after injury   Hand weakness 03/02/2018   Hearing loss 04/21/2013   History of TIA (transient ischemic attack)  03/ 2017  no residual after brief episode loss peripheral vision   HOH (hard of hearing)    Hyperglycemia 05/17/2016   Hyperlipemia    Hyperlipidemia 07/18/2010   Qualifier: Diagnosis of  By: [name] MD, Phillip Heal     Hypertension    HYPERTENSION, BENIGN ESSENTIAL 07/18/2010   Qualifier: Diagnosis of  By: [name] MD, Phillip Heal     Hypokalemia 01/26/2017   Knee pain 03/13/2019   Left shoulder pain 09/04/2018   Pneumothorax 10/11/2019   RIGHT    Prostate cancer ([place]) UROLOGIST-  DR GRAPEY/  ONCOLOGIST-  DR MANNING   dx 2015--- Stage T1c, Gleason 3+4, PSA 4.03, vol 44cc    PVC's (premature ventricular contractions)    Rash and nonspecific skin eruption 10/14/2015   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   S/P CABG x 05 Dec 1998   Skin lesion 10/14/2015   TIA (transient ischemic attack) 10/14/2015   Ventral hernia 10/26/2011   Wears glasses     Past Surgical History:  Procedure Laterality Date   [place]  12-22-2001   dr Daneen Schick   severe native vessel disease mLAD 60-70%,  total occulsion pCFX  and pRCA/  widely patent saphenous vein , free radial , and LIMA grafts/  minminal lv dysfunction, ef 60%   CHEST TUBE INSERTION Right 10/11/2019   CORONARY ARTERY BYPASS GRAFT  May 2000   LIMA to LAD,  SVG to PDA and Diagonal, Free radial graft to OM   Exericse treadmill test  last one 01-03-2014  dr Daneen Schick   normal exercise tolerance w/ hypertensive repsonse,  no ischemic EKG changes, appropriate HR response & recovery (Duke TM score 9;  Low Risk , PVC's w/ exertion)   PROSTATE BIOPSY     RADIOACTIVE SEED IMPLANT N/A 01/13/2016   Procedure: RADIOACTIVE SEED IMPLANT/BRACHYTHERAPY IMPLANT;  Surgeon: Rana Snare, MD;  Location: [facility];  Service: Urology;  Laterality: N/A;    No Known Allergies No current facility-administered medications on file prior to encounter.   Current Outpatient Medications on File Prior to Encounter  Medication Sig Dispense Refill   allopurinol (ZYLOPRIM) 300 MG tablet TAKE 1 TABLET DAILY 90 tablet 3   aspirin EC 81 MG tablet Take 1 tablet (81 mg total) by mouth daily. 90 tablet 3   atorvastatin (LIPITOR) 40 MG tablet TAKE 1 TABLET DAILY 90 tablet 3   Cholecalciferol (VITAMIN D PO) Take 1 capsule by mouth 2 (two) times daily.      escitalopram (LEXAPRO) 5 MG tablet TAKE 1 TABLET DAILY 90 tablet 3   esomeprazole (NEXIUM) 20 MG capsule Take 20 mg by mouth daily at 12 noon.     hydrochlorothiazide (HYDRODIURIL) 25 MG tablet TAKE 1 TABLET DAILY 90 tablet 3   Misc  Natural Products (BLACK [place]ERR[place] CONCENTRATE PO) Take by mouth at bedtime.     Multiple Vitamins-Minerals (OCUVITE PO) Take 1 capsule by mouth daily.     Propylene Glycol (SYSTANE BALANCE) 0.6 % SOLN Place 1 drop into both eyes daily as needed (dry eyes).     sodium chloride (OCEAN) 0.65 % SOLN nasal spray Place 1 spray into both nostrils as needed for congestion.     tamsulosin (FLOMAX) 0.4 MG CAPS capsule TAKE 1 CAPSULE TWICE A DAY 180 capsule 3   Social History   Socioeconomic History   Marital status: Married    Spouse name: Not on file   Number of children:  2   Years of education: Not on file   Highest education level: Not on file  Occupational History   Occupation: Press photographer REP    Employer: [employer] [place]    Comment: [employer], travelling 3-4 nights a week  Tobacco Use   Smoking status: Never   Smokeless tobacco: Never  Vaping Use   Vaping Use: Never used  Substance and Sexual Activity   Alcohol use: No   Drug use: No   Sexual activity: Yes  Other Topics Concern   Not on file  Social History Narrative   Married 1974   2 daughters and 2 grandkids   Retired 2018.   Social Determinants of Health   Financial Resource Strain: Not on file  Food Insecurity: Not on file  Transportation Needs: Not on file  Physical Activity: Not on file  Stress: Not on file  Social Connections: Not on file  Intimate Partner Violence: Not on file    Family History  Problem Relation Age of Onset   Cancer Father        prostate in eighties   Prostate cancer Father    Heart disease Father    Stroke Father    Dementia Mother    Cancer Sister        breast   Cancer Paternal Uncle        prostate   Colon cancer Neg Hx      Review of Systems: [y] = yes, [ ]  = no      General: Weight gain [] ; Weight loss [ ] ; Anorexia [ ] ; Fatigue [ ] ; Fever [ ] ; Chills [ ] ; Weakness [ ]    Cardiac: Chest pain/pressure [ ] ; Resting SOB [ ] ; Exertional SOB [ ] ; Orthopnea [ ] ; Pedal Edema [ ] ;  Palpitations [ ] ; Syncope [ ] ; Presyncope [ ] ; Paroxysmal nocturnal dyspnea[ ]    Pulmonary: Cough [ ] ; Wheezing[ ] ; Hemoptysis[ ] ; Sputum [ ] ; Snoring [ ]    GI: Vomiting[ ] ; Dysphagia[ ] ; Melena[ ] ; Hematochezia [ ] ; Heartburn[ ] ; Abdominal pain [ ] ; Constipation [ ] ; Diarrhea [ ] ; BRBPR [ ]    GU: Hematuria[ ] ; Dysuria [ ] ; Nocturia[ ]  Vascular: Pain in legs with walking [ ] ; Pain in feet with lying flat [ ] ; Non-healing sores [ ] ; Stroke [ ] ; TIA [ ] ; Slurred speech [ ] ;   Neuro: Headaches[ ] ; Vertigo[ ] ; Seizures[ ] ; Paresthesias[ ] ;Blurred vision [ ] ; Diplopia [ ] ; Vision changes [ ]    Ortho/Skin: Arthritis [ ] ; Joint pain [ ] ; Muscle pain [ ] ; Joint swelling [ ] ; Back Pain [ ] ; Rash [ ]    Psych: Depression[ ] ; Anxiety[ ]    Heme: Bleeding problems [ ] ; Clotting disorders [ ] ; Anemia [ ]    Endocrine: Diabetes [ ] ; Thyroid dysfunction[ ]   Physical Exam/Data:  Blood pressure (!) 152/93, pulse 65, temperature 98.7 F (37.1 C), temperature source Oral, resp. rate 16, height 5\' 10"  (1.778 m), weight 96.6 kg, SpO2 97 %.   GENERAL: Patient is afebrile, Vital signs reviewed, Well appearing, Patient appears comfortable, Alert and lucid. EYES: Normal inspection. HEENT:  normocephalic, atraumatic , normal ENT inspection. ORAL:  Moist NECK:  supple , normal inspection. CARD:  S1S2, heart sounds normal. RESP:  no respiratory distress, breath sounds normal. ABD: soft, tender to palpation, BS present, soft, no organomegaly or masses . BACK: non-tender. No CVA tenderness. MUSC:  normal ROM, non-tender , no pedal edema . SKIN: color normal, no rash, warm, dry . NEURO: awake &  alert, lucid, no motor/sensory deficit. Gait stable. PSYCH: mood/affect normal.   Labs: Lab Results  Component Value Date   BUN 18 05/24/2021   Lab Results  Component Value Date   CREATININE 1.06 05/24/2021   Lab Results  Component Value Date   NA 140 05/24/2021   K 3.5 05/24/2021   CL 106 05/24/2021   CO2 26  05/24/2021   Lab Results  Component Value Date   TROPONINI <0.03 01/26/2017   Lab Results  Component Value Date   WBC 6.4 05/24/2021   HGB 13.8 05/24/2021   HCT 40.7 05/24/2021   MCV 88.9 05/24/2021   PLT 170 05/24/2021   Lab Results  Component Value Date   CHOL 183 10/01/2020   HDL 47.40 10/01/2020   LDLCALC 66 03/09/2019   LDLDIRECT 97.0 10/01/2020   TRIG 230.0 (H) 10/01/2020   CHOLHDL 4 10/01/2020   Lab Results  Component Value Date   ALT 23 05/24/2021   AST 30 05/24/2021   ALKPHOS 95 05/24/2021   BILITOT 1.0 05/24/2021      Radiology: DG Chest Portable 1 View  Result Date: 05/24/2021 CLINICAL DATA:  Chest pain EXAM: PORTABLE CHEST 1 VIEW COMPARISON:  Chest radiograph dated 10/25/2019. FINDINGS: The heart size and mediastinal contours are within normal limits. Both lungs are clear. Median sternotomy wires and mediastinal clips are noted. Degenerative changes are seen in the spine. IMPRESSION: No active disease. Electronically Signed   By: Zerita Boers M.D.   On: 05/24/2021 18:27      Signed: Ahmed Darrow Bussing 05/24/2021, 11:25 PM

## 2021-05-25 ENCOUNTER — Inpatient Hospital Stay (HOSPITAL_COMMUNITY): Payer: Medicare Other

## 2021-05-25 DIAGNOSIS — I25110 Atherosclerotic heart disease of native coronary artery with unstable angina pectoris: Secondary | ICD-10-CM

## 2021-05-25 DIAGNOSIS — Z951 Presence of aortocoronary bypass graft: Secondary | ICD-10-CM

## 2021-05-25 DIAGNOSIS — I214 Non-ST elevation (NSTEMI) myocardial infarction: Secondary | ICD-10-CM

## 2021-05-25 DIAGNOSIS — I10 Essential (primary) hypertension: Secondary | ICD-10-CM | POA: Diagnosis not present

## 2021-05-25 DIAGNOSIS — E785 Hyperlipidemia, unspecified: Secondary | ICD-10-CM

## 2021-05-25 DIAGNOSIS — E119 Type 2 diabetes mellitus without complications: Secondary | ICD-10-CM

## 2021-05-25 LAB — CBC WITH DIFFERENTIAL/PLATELET
Abs Immature Granulocytes: 0.02 10*3/uL (ref 0.00–0.07)
Basophils Absolute: 0 10*3/uL (ref 0.0–0.1)
Basophils Relative: 0 %
Eosinophils Absolute: 0.1 10*3/uL (ref 0.0–0.5)
Eosinophils Relative: 1 %
HCT: 40.4 % (ref 39.0–52.0)
Hemoglobin: 13.2 g/dL (ref 13.0–17.0)
Immature Granulocytes: 0 %
Lymphocytes Relative: 29 %
Lymphs Abs: 1.9 10*3/uL (ref 0.7–4.0)
MCH: 29.8 pg (ref 26.0–34.0)
MCHC: 32.7 g/dL (ref 30.0–36.0)
MCV: 91.2 fL (ref 80.0–100.0)
Monocytes Absolute: 0.5 10*3/uL (ref 0.1–1.0)
Monocytes Relative: 8 %
Neutro Abs: 4.3 10*3/uL (ref 1.7–7.7)
Neutrophils Relative %: 62 %
Platelets: 173 10*3/uL (ref 150–400)
RBC: 4.43 MIL/uL (ref 4.22–5.81)
RDW: 12.8 % (ref 11.5–15.5)
WBC: 6.8 10*3/uL (ref 4.0–10.5)
nRBC: 0 % (ref 0.0–0.2)

## 2021-05-25 LAB — LIPID PANEL
Cholesterol: 154 mg/dL (ref 0–200)
HDL: 49 mg/dL (ref >40)
LDL Cholesterol: 85 mg/dL (ref 0–99)
Total CHOL/HDL Ratio: 3.1 RATIO
Triglycerides: 98 mg/dL (ref <150)
VLDL: 20 mg/dL (ref 0–40)

## 2021-05-25 LAB — ECHOCARDIOGRAM COMPLETE
AR max vel: 2.57 cm2
AV Peak grad: 13.7 mmHg
Ao pk vel: 1.85 m/s
Area-P 1/2: 2.8 cm2
Height: 70 in
S' Lateral: 3.3 cm
Weight: 3408 oz

## 2021-05-25 LAB — BASIC METABOLIC PANEL
Anion gap: 10 (ref 5–15)
BUN: 17 mg/dL (ref 8–23)
CO2: 25 mmol/L (ref 22–32)
Calcium: 9.3 mg/dL (ref 8.9–10.3)
Chloride: 102 mmol/L (ref 98–111)
Creatinine, Ser: 0.97 mg/dL (ref 0.61–1.24)
GFR, Estimated: >60 mL/min (ref >60)
Glucose, Bld: 106 mg/dL — ABNORMAL HIGH (ref 70–99)
Potassium: 3.3 mmol/L — ABNORMAL LOW (ref 3.5–5.1)
Sodium: 137 mmol/L (ref 135–145)

## 2021-05-25 LAB — HEPARIN LEVEL (UNFRACTIONATED)
Heparin Unfractionated: 0.32 IU/mL (ref 0.30–0.70)
Heparin Unfractionated: 0.35 IU/mL (ref 0.30–0.70)
Heparin Unfractionated: 0.19 IU/mL — ABNORMAL LOW (ref 0.30–0.70)

## 2021-05-25 LAB — GLUCOSE, CAPILLARY
Glucose-Capillary: 160 mg/dL — ABNORMAL HIGH (ref 70–99)
Glucose-Capillary: 108 mg/dL — ABNORMAL HIGH (ref 70–99)

## 2021-05-25 LAB — HIV ANTIBODY (ROUTINE TESTING W REFLEX): HIV Screen 4th Generation wRfx: NONREACTIVE

## 2021-05-25 LAB — CBG MONITORING, ED: Glucose-Capillary: 134 mg/dL — ABNORMAL HIGH (ref 70–99)

## 2021-05-25 MED ORDER — PANTOPRAZOLE SODIUM 40 MG PO TBEC
40.0000 mg | DELAYED_RELEASE_TABLET | Freq: Every day | ORAL | Status: DC
  Administered 2021-05-25 – 2021-05-27 (×3): 40 mg via ORAL
  Filled 2021-05-25 (×3): qty 1

## 2021-05-25 MED ORDER — ONDANSETRON HCL 4 MG/2ML IJ SOLN: 4.0000 mg | Freq: Four times a day (QID) | INTRAMUSCULAR | Status: DC | PRN

## 2021-05-25 MED ORDER — SODIUM CHLORIDE 0.9 % IV SOLN: 250.0000 mL | INTRAVENOUS | Status: DC | PRN

## 2021-05-25 MED ORDER — POTASSIUM CHLORIDE CRYS ER 20 MEQ PO TBCR
40.0000 meq | EXTENDED_RELEASE_TABLET | Freq: Once | ORAL | Status: AC
  Administered 2021-05-25: 40 meq via ORAL
  Filled 2021-05-25: qty 2

## 2021-05-25 MED ORDER — METOPROLOL TARTRATE 12.5 MG HALF TABLET
12.5000 mg | ORAL_TABLET | Freq: Two times a day (BID) | ORAL | Status: DC
  Administered 2021-05-25 – 2021-05-27 (×6): 12.5 mg via ORAL
  Filled 2021-05-25 (×6): qty 1

## 2021-05-25 MED ORDER — SODIUM CHLORIDE 0.9 % WEIGHT BASED INFUSION
3.0000 mL/kg/h | INTRAVENOUS | Status: DC
  Administered 2021-05-26: 3 mL/kg/h via INTRAVENOUS

## 2021-05-25 MED ORDER — ALLOPURINOL 300 MG PO TABS
300.0000 mg | ORAL_TABLET | Freq: Every day | ORAL | Status: DC
  Administered 2021-05-25 – 2021-05-27 (×3): 300 mg via ORAL
  Filled 2021-05-25: qty 1
  Filled 2021-05-25: qty 3
  Filled 2021-05-25: qty 1

## 2021-05-25 MED ORDER — ESCITALOPRAM OXALATE 10 MG PO TABS
5.0000 mg | ORAL_TABLET | Freq: Every day | ORAL | Status: DC
  Administered 2021-05-25 – 2021-05-27 (×3): 5 mg via ORAL
  Filled 2021-05-25 (×3): qty 1

## 2021-05-25 MED ORDER — SODIUM CHLORIDE 0.9% FLUSH
3.0000 mL | Freq: Two times a day (BID) | INTRAVENOUS | Status: DC
  Administered 2021-05-25 – 2021-05-27 (×2): 3 mL via INTRAVENOUS

## 2021-05-25 MED ORDER — ASPIRIN 81 MG PO CHEW
81.0000 mg | CHEWABLE_TABLET | ORAL | Status: AC
  Administered 2021-05-26: 81 mg via ORAL
  Filled 2021-05-25: qty 1

## 2021-05-25 MED ORDER — SODIUM CHLORIDE 0.9 % WEIGHT BASED INFUSION: 1.0000 mL/kg/h | INTRAVENOUS | Status: DC

## 2021-05-25 MED ORDER — INSULIN ASPART 100 UNIT/ML IJ SOLN
0.0000 [IU] | Freq: Three times a day (TID) | INTRAMUSCULAR | Status: DC
  Administered 2021-05-25: 3 [IU] via SUBCUTANEOUS
  Administered 2021-05-25: 2 [IU] via SUBCUTANEOUS
  Administered 2021-05-26: 3 [IU] via SUBCUTANEOUS

## 2021-05-25 MED ORDER — ASPIRIN EC 81 MG PO TBEC
81.0000 mg | DELAYED_RELEASE_TABLET | Freq: Every day | ORAL | Status: DC
  Administered 2021-05-25 – 2021-05-27 (×2): 81 mg via ORAL
  Filled 2021-05-25 (×2): qty 1

## 2021-05-25 MED ORDER — ATORVASTATIN CALCIUM 80 MG PO TABS
80.0000 mg | ORAL_TABLET | Freq: Every day | ORAL | Status: DC
  Administered 2021-05-25 – 2021-05-27 (×3): 80 mg via ORAL
  Filled 2021-05-25 (×3): qty 1

## 2021-05-25 MED ORDER — SODIUM CHLORIDE 0.9% FLUSH: 3.0000 mL | INTRAVENOUS | Status: DC | PRN

## 2021-05-25 MED ORDER — ACETAMINOPHEN 325 MG PO TABS
650.0000 mg | ORAL_TABLET | ORAL | Status: DC | PRN
  Administered 2021-05-26: 650 mg via ORAL
  Filled 2021-05-25: qty 2

## 2021-05-25 MED ORDER — TAMSULOSIN HCL 0.4 MG PO CAPS
0.4000 mg | ORAL_CAPSULE | Freq: Two times a day (BID) | ORAL | Status: DC
  Administered 2021-05-25 – 2021-05-27 (×6): 0.4 mg via ORAL
  Filled 2021-05-25 (×6): qty 1

## 2021-05-25 NOTE — Progress Notes (Signed)
ANTICOAGULATION CONSULT NOTE - Initial Consult  Pharmacy Consult for Heparin Indication: chest pain/ACS  No Known Allergies  Patient Measurements: Height: 5\' 10"  (177.8 cm) Weight: 96.6 kg (213 lb) IBW/kg (Calculated) : 73 Heparin Dosing Weight: 92.9 kg  Vital Signs: BP: 139/80 (10/23 1000) Pulse Rate: 67 (10/23 1000)  Labs: Recent Labs    05/24/21 1814 05/24/21 1926 05/25/21 0251  HGB 13.8  --   --   HCT 40.7  --   --   PLT 170  --   --   LABPROT 13.1  --   --   INR 1.0  --   --   HEPARINUNFRC  --   --  0.32  CREATININE 1.06  --  0.97  TROPONINIHS 887* 942*  --      Estimated Creatinine Clearance: 83.8 mL/min (by C-G formula based on SCr of 0.97 mg/dL).   Medical History: Past Medical History:  Diagnosis Date   Anxiety state 03/16/2016   Aphasia 01/26/2017   Benign localized prostatic hyperplasia with lower urinary tract symptoms (LUTS)    BPV (benign positional vertigo) 11/02/2014   Carotid artery stenosis    PER DUPLEX 10-22-2015  BILATERAL ICA 1-39%   Coronary artery disease    CARDIOLOGIST-  DR Daneen Schick--  VISIT EVERY OTHER YEAR-- LOV 2015   Coronary atherosclerosis 07/19/2010   Qualifier: Diagnosis of  By: [name] MD, Phillip Heal     Depression 01/26/2017   Diabetes mellitus without complication ([place]) 5/32/9924   GERD 07/19/2010   Qualifier: Diagnosis of  By: [name] MD, Phillip Heal     GERD (gastroesophageal reflux disease)    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   Gout    Gout 07/18/2010   Qualifier: Diagnosis of  By: [name] MD, Phillip Heal     H/O eye injury    chronic changes to left eye after injury   Hand weakness 03/02/2018   Hearing loss 04/21/2013   History of TIA (transient ischemic attack)    03/ 2017  no residual after brief episode loss peripheral vision   HOH (hard of hearing)    Hyperglycemia 05/17/2016   Hyperlipemia    Hyperlipidemia 07/18/2010   Qualifier: Diagnosis of  By: [name] MD, Phillip Heal     Hypertension    HYPERTENSION, BENIGN ESSENTIAL 07/18/2010    Qualifier: Diagnosis of  By: [name] MD, [place]     Hypokalemia 01/26/2017   Knee pain 03/13/2019   Left shoulder pain 09/04/2018   Pneumothorax 10/11/2019   RIGHT    Prostate cancer ([place]) UROLOGIST-  DR GRAPEY/  ONCOLOGIST-  DR MANNING   dx 2015--- Stage T1c, Gleason 3+4, PSA 4.03, vol 44cc   PVC's (premature ventricular contractions)    Rash and nonspecific skin eruption 10/14/2015   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   S/P CABG x 05 Dec 1998   Skin lesion 10/14/2015   TIA (transient ischemic attack) 10/14/2015   Ventral hernia 10/26/2011   Wears glasses     Medications:  (Not in a hospital admission)  Scheduled:   allopurinol  300 mg Oral Daily   aspirin EC  81 mg Oral Daily   atorvastatin  80 mg Oral Daily   escitalopram  5 mg Oral Daily   insulin aspart  0-15 Units Subcutaneous TID WC   losartan  25 mg Oral Daily   metoprolol tartrate  12.5 mg Oral BID   pantoprazole  40 mg Oral Daily   tamsulosin  0.4 mg Oral BID   Infusions:  heparin 1,350 Units/hr (05/25/21 0326)   PRN: acetaminophen, nitroGLYCERIN, ondansetron (ZOFRAN) IV  Assessment: 42 yom with a history of CAD s/p 3v CABG '00 (LIMA-LAD, SVG-RCA, free radial to OM), HTN, DM, HLD, TIA, anxiety and GERD. Patient is presenting with chest pain. Heparin per pharmacy consult placed for chest pain/ACS.  Patient is on not on anticoagulation prior to arrival.  Heparin infusion started at 1200 units/hr. Five hour heparin level back at 0.32 overnight and infusion increased to 1350 units/hr. Repeat level today at 1218 back at 0.35 which is therapeutic. No issues with infusion noted.  Hgb 13.8; plt 170  Goal of Therapy:  Heparin level 0.3-0.7 units/ml Monitor platelets by anticoagulation protocol: Yes   Plan:  Resume heparin infusion at 1350 units/hr given level is therapeutic. If re-check in 6 hours remains at low end of therapeutic range will plan for slight increase to infusion rate Check anti-Xa level  in 6 hours and daily while on heparin Continue to monitor H&H and platelets  Lorelei Pont, PharmD, BCPS 05/25/2021 11:29 AM ED Clinical Pharmacist -  [phone]

## 2021-05-25 NOTE — Progress Notes (Signed)
ANTICOAGULATION CONSULT NOTE  Pharmacy Consult for Heparin Indication: chest pain/ACS  No Known Allergies  Patient Measurements: Height: 5\' 10"  (177.8 cm) Weight: 96.5 kg (212 lb 12.8 oz) IBW/kg (Calculated) : 73 Heparin Dosing Weight: 92.9 kg  Vital Signs: Temp: 98.6 F (37 C) (10/23 1450) Temp Source: Oral (10/23 1450) BP: 118/80 (10/23 1200) Pulse Rate: 63 (10/23 1200)  Labs: Recent Labs    05/24/21 1814 05/24/21 1926 05/25/21 0251 05/25/21 1218 05/25/21 1804  HGB 13.8  --   --  13.2  --   HCT 40.7  --   --  40.4  --   PLT 170  --   --  173  --   LABPROT 13.1  --   --   --   --   INR 1.0  --   --   --   --   HEPARINUNFRC  --   --  0.32 0.35 0.19*  CREATININE 1.06  --  0.97  --   --   TROPONINIHS 887* 942*  --   --   --      Estimated Creatinine Clearance: 83.8 mL/min (by C-G formula based on SCr of 0.97 mg/dL).   Medical History: Past Medical History:  Diagnosis Date   Anxiety state 03/16/2016   Aphasia 01/26/2017   Benign localized prostatic hyperplasia with lower urinary tract symptoms (LUTS)    BPV (benign positional vertigo) 11/02/2014   Carotid artery stenosis    PER DUPLEX 10-22-2015  BILATERAL ICA 1-39%   Coronary artery disease    CARDIOLOGIST-  DR Daneen Schick--  VISIT EVERY OTHER YEAR-- LOV 2015   Coronary atherosclerosis 07/19/2010   Qualifier: Diagnosis of  By: [name] MD, [place]     Depression 01/26/2017   Diabetes mellitus without complication ([place]) 1/66/0630   GERD 07/19/2010   Qualifier: Diagnosis of  By: [name] MD, Phillip Heal     GERD (gastroesophageal reflux disease)    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   Gout    Gout 07/18/2010   Qualifier: Diagnosis of  By: [name] MD, Phillip Heal     H/O eye injury    chronic changes to left eye after injury   Hand weakness 03/02/2018   Hearing loss 04/21/2013   History of TIA (transient ischemic attack)    03/ 2017  no residual after brief episode loss peripheral vision   HOH (hard of hearing)    Hyperglycemia  05/17/2016   Hyperlipemia    Hyperlipidemia 07/18/2010   Qualifier: Diagnosis of  By: [name] MD, [place]     Hypertension    HYPERTENSION, BENIGN ESSENTIAL 07/18/2010   Qualifier: Diagnosis of  By: [name] MD, [place]     Hypokalemia 01/26/2017   Knee pain 03/13/2019   Left shoulder pain 09/04/2018   Pneumothorax 10/11/2019   RIGHT    Prostate cancer ([place]) UROLOGIST-  DR GRAPEY/  ONCOLOGIST-  DR MANNING   dx 2015--- Stage T1c, Gleason 3+4, PSA 4.03, vol 44cc   PVC's (premature ventricular contractions)    Rash and nonspecific skin eruption 10/14/2015   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 90 tablet 3 05/24/2021   atorvastatin (LIPITOR) 40 MG tablet TAKE 1 TABLET DAILY (Patient taking differently: Take 40 mg by mouth daily.) 90 tablet 3 05/24/2021   Cholecalciferol (VITAMIN D PO) Take 1 capsule by mouth daily.   05/24/2021   escitalopram (LEXAPRO) 5 MG tablet TAKE 1 TABLET DAILY (Patient taking differently: Take 5 mg by mouth daily.) 90 tablet 3 05/24/2021   esomeprazole (NEXIUM) 20 MG capsule Take 20 mg by mouth daily at 12 noon.   05/24/2021   hydrochlorothiazide (HYDRODIURIL) 25 MG tablet TAKE 1 TABLET DAILY (Patient taking differently: Take 25 mg by mouth daily.) 90 tablet 3 05/24/2021   Misc Natural Products (BLACK CHERRY CONCENTRATE PO) Take 1 tablet by mouth at bedtime.   05/24/2021   Multiple Vitamins-Minerals (OCUVITE PO) Take 1 capsule by mouth daily.   05/24/2021   Propylene Glycol (SYSTANE BALANCE) 0.6 % SOLN Place 1 drop into both  eyes daily as needed (dry eyes).   05/24/2021   sodium chloride (OCEAN) 0.65 % SOLN nasal spray Place 1 spray into both nostrils as needed for congestion.   Past Week   tamsulosin (FLOMAX) 0.4 MG CAPS capsule TAKE 1 CAPSULE TWICE A DAY (Patient taking differently: Take 0.4 mg by mouth 2 (two) times daily.) 180 capsule 3 05/24/2021    Scheduled:   allopurinol  300 mg Oral Daily   [START ON 05/26/2021] aspirin  81 mg Oral Pre-Cath   aspirin EC  81 mg Oral Daily   atorvastatin  80 mg Oral Daily   escitalopram  5 mg Oral Daily   insulin aspart  0-15 Units Subcutaneous TID WC   losartan  25 mg Oral Daily   metoprolol tartrate  12.5 mg Oral BID   pantoprazole  40 mg Oral Daily   sodium chloride flush  3 mL Intravenous Q12H   tamsulosin  0.4 mg Oral BID   Infusions:   sodium chloride     [START ON 05/26/2021] sodium chloride     Followed by   Derrill Memo ON 05/26/2021] sodium chloride     heparin 1,350 Units/hr (05/25/21 1235)   PRN: sodium chloride, acetaminophen, nitroGLYCERIN, ondansetron (ZOFRAN) IV, sodium chloride flush  Assessment: 32 yom with a history of CAD s/p 3v CABG '00 (LIMA-LAD, SVG-RCA, free radial to OM), HTN, DM, HLD, TIA, anxiety and GERD. Patient is presenting with chest pain. Heparin per pharmacy consult placed for chest pain/ACS. Patient is on not on anticoagulation prior to arrival.  Heparin level subtherapeutic, per RN the IV site may have been leaking earlier therefore will adjust conservatively.  Goal of Therapy:  Heparin level 0.3-0.7 units/ml Monitor platelets by anticoagulation protocol: Yes   Plan:  Increase heparin to 1450 units/h - no bolus Recheck heparin level with am labs   Arrie Senate, PharmD, BCPS, [facility] Clinical Pharmacist [phone] Please check AMION for all [place] numbers 05/25/2021

## 2021-05-25 NOTE — Progress Notes (Signed)
  Echocardiogram 2D Echocardiogram has been performed.  Tyler Guerra 05/25/2021, 1:55 PM

## 2021-05-25 NOTE — Progress Notes (Signed)
ANTICOAGULATION CONSULT NOTE  Pharmacy Consult for Heparin Indication: chest pain/ACS  No Known Allergies  Patient Measurements: Height: 5\' 10"  (177.8 cm) Weight: 96.6 kg (213 lb) IBW/kg (Calculated) : 73 Heparin Dosing Weight: 92.9 kg  Vital Signs: Temp: 98.7 F (37.1 C) (10/22 1728) Temp Source: Oral (10/22 1728) BP: 144/91 (10/23 0102) Pulse Rate: 67 (10/23 0102)  Labs: Recent Labs    05/24/21 1814 05/24/21 1926 05/25/21 0251  HGB 13.8  --   --   HCT 40.7  --   --   PLT 170  --   --   LABPROT 13.1  --   --   INR 1.0  --   --   HEPARINUNFRC  --   --  0.32  CREATININE 1.06  --   --   TROPONINIHS 887* 942*  --      Estimated Creatinine Clearance: 76.7 mL/min (by C-G formula based on SCr of 1.06 mg/dL).  Assessment: 70 y.o. male with chest pain for heparin   Goal of Therapy:  Heparin level 0.3-0.7 units/ml Monitor platelets by anticoagulation protocol: Yes   Plan:  Increase Heparin 1350 units/hr to keep within goal range Check heparin level in 6 hours.   Phillis Knack, PharmD, BCPS

## 2021-05-25 NOTE — Progress Notes (Signed)
Progress Note  Patient Name: Tyler Guerra. Date of Encounter: 05/25/2021  [facility] Cardiologist: Sinclair Grooms, MD   Subjective   No further chest pain.   Inpatient Medications    Scheduled Meds:  allopurinol  300 mg Oral Daily   aspirin EC  81 mg Oral Daily   atorvastatin  80 mg Oral Daily   escitalopram  5 mg Oral Daily   insulin aspart  0-15 Units Subcutaneous TID WC   losartan  25 mg Oral Daily   metoprolol tartrate  12.5 mg Oral BID   pantoprazole  40 mg Oral Daily   tamsulosin  0.4 mg Oral BID   Continuous Infusions:  heparin 1,350 Units/hr (05/25/21 0326)   PRN Meds: acetaminophen, nitroGLYCERIN, ondansetron (ZOFRAN) IV   Vital Signs    Vitals:   05/25/21 0400 05/25/21 0600 05/25/21 0700 05/25/21 0918  BP: 137/88 (!) 132/91 133/87 (!) 157/138  Pulse: 60 66 65 74  Resp: 15 14 16    Temp:      TempSrc:      SpO2: 99% 99% 98%   Weight:      Height:        Intake/Output Summary (Last 24 hours) at 05/25/2021 0959 Last data filed at 05/25/2021 0847 Gross per 24 hour  Intake 240 ml  Output 600 ml  Net -360 ml   Last 3 Weights 05/24/2021 04/08/2021 10/17/2020  Weight (lbs) 213 lb 215 lb 222 lb 6.4 oz  Weight (kg) 96.616 kg 97.523 kg 100.88 kg      Telemetry    NSR - Personally Reviewed  ECG    NSR with nonspecific T wave pattern - Personally Reviewed  Physical Exam   GEN: No acute distress.   Neck: No JVD Cardiac: RRR, no murmurs, rubs, or gallops. S/P left radial artery harvest for CABG. R radial 2+ Respiratory: Clear to auscultation bilaterally. GI: Soft, nontender, non-distended  MS: No edema; No deformity. Neuro:  Nonfocal  Psych: Normal affect   Labs    High Sensitivity Troponin:   Recent Labs  Lab 05/24/21 1814 05/24/21 1926  TROPONINIHS 887* 942*     Chemistry Recent Labs  Lab 05/24/21 1814 05/25/21 0251  NA 140 137  K 3.5 3.3*  CL 106 102  CO2 26 25  GLUCOSE 102* 106*  BUN 18 17  CREATININE 1.06 0.97   CALCIUM 9.5 9.3  PROT 6.5  --   ALBUMIN 3.6  --   AST 30  --   ALT 23  --   ALKPHOS 95  --   BILITOT 1.0  --   GFRNONAA >60 >60  ANIONGAP 8 10    Lipids  Recent Labs  Lab 05/25/21 0251  CHOL 154  TRIG 98  HDL 49  LDLCALC 85  CHOLHDL 3.1    Hematology Recent Labs  Lab 05/24/21 1814  WBC 6.4  RBC 4.58  HGB 13.8  HCT 40.7  MCV 88.9  MCH 30.1  MCHC 33.9  RDW 12.6  PLT 170   Thyroid No results for input(s): TSH, FREET4 in the last 168 hours.  BNPNo results for input(s): BNP, PROBNP in the last 168 hours.  DDimer No results for input(s): DDIMER in the last 168 hours.   Radiology    DG Chest Portable 1 View  Result Date: 05/24/2021 CLINICAL DATA:  Chest pain EXAM: PORTABLE CHEST 1 VIEW COMPARISON:  Chest radiograph dated 10/25/2019. FINDINGS: The heart size and mediastinal contours are within normal limits.  Both lungs are clear. Median sternotomy wires and mediastinal clips are noted. Degenerative changes are seen in the spine. IMPRESSION: No active disease. Electronically Signed   By: Zerita Boers M.D.   On: 05/24/2021 18:27    Cardiac Studies   Echo: pending   Patient Profile     70 y.o. male with PMH of CAD s/p 3v CABG '00 (LIMA-LAD, SVG-RCA, free radial to OM), HTN, DM, HLD, TIA, anxiety and GERD who presented with chest pain and found to have a NSTEMI.   Assessment & Plan    NSTEMI: hsTn 887>>942. EKG without acute ST/T wave changes. Remains on IV heparin. No recurrent chest pain. Planned for cardiac cath tomorrow.  -- continue IV heparin, ASA, statin, metoprolol 12.5mg  BID, losartan 25mg  daily -- echo pending  Shared Decision Making/Informed Consent{ The risks [stroke (1 in 1000), death (1 in 1000), kidney failure [usually temporary] (1 in 500), bleeding (1 in 200), allergic reaction [possibly serious] (1 in 200)], benefits (diagnostic support and management of coronary artery disease) and alternatives of a cardiac catheterization were discussed in detail  with Mr. Creswell and he is willing to proceed.  He is status post left radial artery harvest for his cath. May need to go femoral give grafts vs. Attempt at right radial. We discussed this.  HTN: Initially elevated in the ED, but now improved.  -- continue metoprolol 12.5mg  BID, losartan 25mg  daily   HLD: LDL 85 -- atorvastatin increased from 40mg  to 80mg  daily  DM: Hgb A1c 6.7 -- SSI while inpatient  Hypokalemia: K+ 3.0, supplement  -- BMET in am    For questions or updates, please contact [place] Please consult [email or web address] for contact info under        Signed, Buford Dresser, MD  05/25/2021, 9:59 AM

## 2021-05-26 ENCOUNTER — Encounter (HOSPITAL_COMMUNITY)
Admission: EM | Disposition: A | Discharge status: Home / Self Care | Payer: Self-pay | Source: Home / Self Care | Attending: Cardiology

## 2021-05-26 ENCOUNTER — Encounter (HOSPITAL_COMMUNITY): Payer: Self-pay | Admitting: Internal Medicine

## 2021-05-26 DIAGNOSIS — E119 Type 2 diabetes mellitus without complications: Secondary | ICD-10-CM | POA: Diagnosis not present

## 2021-05-26 DIAGNOSIS — I25810 Atherosclerosis of coronary artery bypass graft(s) without angina pectoris: Secondary | ICD-10-CM | POA: Diagnosis not present

## 2021-05-26 DIAGNOSIS — I214 Non-ST elevation (NSTEMI) myocardial infarction: Secondary | ICD-10-CM | POA: Diagnosis not present

## 2021-05-26 DIAGNOSIS — I2510 Atherosclerotic heart disease of native coronary artery without angina pectoris: Secondary | ICD-10-CM

## 2021-05-26 DIAGNOSIS — I10 Essential (primary) hypertension: Secondary | ICD-10-CM | POA: Diagnosis not present

## 2021-05-26 DIAGNOSIS — E785 Hyperlipidemia, unspecified: Secondary | ICD-10-CM | POA: Diagnosis not present

## 2021-05-26 HISTORY — PX: CORONARY STENT INTERVENTION: CATH118234

## 2021-05-26 HISTORY — PX: LEFT HEART CATH AND CORS/GRAFTS ANGIOGRAPHY: CATH118250

## 2021-05-26 LAB — CBC
HCT: 39.1 % (ref 39.0–52.0)
Hemoglobin: 13.1 g/dL (ref 13.0–17.0)
MCH: 30.2 pg (ref 26.0–34.0)
MCHC: 33.5 g/dL (ref 30.0–36.0)
MCV: 90.1 fL (ref 80.0–100.0)
Platelets: 181 10*3/uL (ref 150–400)
RBC: 4.34 MIL/uL (ref 4.22–5.81)
RDW: 12.9 % (ref 11.5–15.5)
WBC: 7.9 10*3/uL (ref 4.0–10.5)
nRBC: 0 % (ref 0.0–0.2)

## 2021-05-26 LAB — GLUCOSE, CAPILLARY
Glucose-Capillary: 127 mg/dL — ABNORMAL HIGH (ref 70–99)
Glucose-Capillary: 105 mg/dL — ABNORMAL HIGH (ref 70–99)
Glucose-Capillary: 160 mg/dL — ABNORMAL HIGH (ref 70–99)
Glucose-Capillary: 132 mg/dL — ABNORMAL HIGH (ref 70–99)

## 2021-05-26 LAB — BASIC METABOLIC PANEL
Anion gap: 7 (ref 5–15)
BUN: 16 mg/dL (ref 8–23)
CO2: 25 mmol/L (ref 22–32)
Calcium: 8.8 mg/dL — ABNORMAL LOW (ref 8.9–10.3)
Chloride: 104 mmol/L (ref 98–111)
Creatinine, Ser: 0.98 mg/dL (ref 0.61–1.24)
GFR, Estimated: >60 mL/min (ref >60)
Glucose, Bld: 135 mg/dL — ABNORMAL HIGH (ref 70–99)
Potassium: 3.6 mmol/L (ref 3.5–5.1)
Sodium: 136 mmol/L (ref 135–145)

## 2021-05-26 LAB — POCT ACTIVATED CLOTTING TIME
Activated Clotting Time: 265 seconds
Activated Clotting Time: 283 seconds

## 2021-05-26 LAB — HEPARIN LEVEL (UNFRACTIONATED): Heparin Unfractionated: 0.51 IU/mL (ref 0.30–0.70)

## 2021-05-26 SURGERY — LEFT HEART CATH AND CORS/GRAFTS ANGIOGRAPHY
Anesthesia: LOCAL

## 2021-05-26 MED ORDER — HYDRALAZINE HCL 20 MG/ML IJ SOLN: 10.0000 mg | INTRAMUSCULAR | Status: AC | PRN

## 2021-05-26 MED ORDER — MIDAZOLAM HCL 2 MG/2ML IJ SOLN
INTRAMUSCULAR | Status: AC
  Filled 2021-05-26: qty 2

## 2021-05-26 MED ORDER — SODIUM CHLORIDE 0.9 % IV SOLN: 250.0000 mL | INTRAVENOUS | Status: DC | PRN

## 2021-05-26 MED ORDER — LABETALOL HCL 5 MG/ML IV SOLN: 10.0000 mg | INTRAVENOUS | Status: AC | PRN

## 2021-05-26 MED ORDER — SODIUM CHLORIDE 0.9% FLUSH
3.0000 mL | Freq: Two times a day (BID) | INTRAVENOUS | Status: DC
  Administered 2021-05-26: 3 mL via INTRAVENOUS

## 2021-05-26 MED ORDER — FENTANYL CITRATE (PF) 100 MCG/2ML IJ SOLN
INTRAMUSCULAR | Status: AC
  Filled 2021-05-26: qty 2

## 2021-05-26 MED ORDER — ONDANSETRON HCL 4 MG/2ML IJ SOLN: 4.0000 mg | Freq: Four times a day (QID) | INTRAMUSCULAR | Status: DC | PRN

## 2021-05-26 MED ORDER — IOHEXOL 350 MG/ML SOLN
INTRAVENOUS | Status: DC | PRN
  Administered 2021-05-26: 125 mL via INTRA_ARTERIAL

## 2021-05-26 MED ORDER — SODIUM CHLORIDE 0.9% FLUSH: 3.0000 mL | INTRAVENOUS | Status: DC | PRN

## 2021-05-26 MED ORDER — HEPARIN SODIUM (PORCINE) 1000 UNIT/ML IJ SOLN
INTRAMUSCULAR | Status: DC | PRN
  Administered 2021-05-26: 7000 [IU] via INTRAVENOUS
  Administered 2021-05-26: 1000 [IU] via INTRAVENOUS

## 2021-05-26 MED ORDER — HEPARIN (PORCINE) IN NACL 1000-0.9 UT/500ML-% IV SOLN
INTRAVENOUS | Status: AC
  Filled 2021-05-26: qty 500

## 2021-05-26 MED ORDER — ACETAMINOPHEN 325 MG PO TABS: 650.0000 mg | ORAL_TABLET | ORAL | Status: DC | PRN

## 2021-05-26 MED ORDER — HEPARIN (PORCINE) IN NACL 1000-0.9 UT/500ML-% IV SOLN
INTRAVENOUS | Status: DC | PRN
  Administered 2021-05-26 (×2): 500 mL

## 2021-05-26 MED ORDER — FENTANYL CITRATE (PF) 100 MCG/2ML IJ SOLN
INTRAMUSCULAR | Status: DC | PRN
  Administered 2021-05-26 (×2): 25 ug via INTRAVENOUS

## 2021-05-26 MED ORDER — CLOPIDOGREL BISULFATE 75 MG PO TABS
75.0000 mg | ORAL_TABLET | Freq: Every day | ORAL | Status: DC
  Administered 2021-05-27: 75 mg via ORAL
  Filled 2021-05-26: qty 1

## 2021-05-26 MED ORDER — CLOPIDOGREL BISULFATE 75 MG PO TABS
600.0000 mg | ORAL_TABLET | ORAL | Status: AC
  Administered 2021-05-26: 600 mg via ORAL
  Filled 2021-05-26: qty 8

## 2021-05-26 MED ORDER — LIDOCAINE HCL (PF) 1 % IJ SOLN
INTRAMUSCULAR | Status: AC
  Filled 2021-05-26: qty 30

## 2021-05-26 MED ORDER — LIDOCAINE HCL (PF) 1 % IJ SOLN
INTRAMUSCULAR | Status: DC | PRN
  Administered 2021-05-26: 15 mL

## 2021-05-26 MED ORDER — MIDAZOLAM HCL 2 MG/2ML IJ SOLN
INTRAMUSCULAR | Status: DC | PRN
  Administered 2021-05-26 (×2): 1 mg via INTRAVENOUS

## 2021-05-26 MED ORDER — POTASSIUM CHLORIDE CRYS ER 20 MEQ PO TBCR
40.0000 meq | EXTENDED_RELEASE_TABLET | Freq: Once | ORAL | Status: AC
  Administered 2021-05-26: 40 meq via ORAL
  Filled 2021-05-26: qty 2

## 2021-05-26 SURGICAL SUPPLY — 21 items
BALLN SAPPHIRE 2.0X12 (BALLOONS) ×2
BALLN SAPPHIRE ~~LOC~~ 2.5X12 (BALLOONS) ×2 IMPLANT
BALLOON SAPPHIRE 2.0X12 (BALLOONS) ×1 IMPLANT
CATH INFINITI 6F 1M (CATHETERS) ×2 IMPLANT
CATH INFINITI 6F ANG MULTIPACK (CATHETERS) ×2 IMPLANT
CATH LAUNCHER 6FR EBU3.5 (CATHETERS) ×2 IMPLANT
CLOSURE PERCLOSE PROSTYLE (VASCULAR PRODUCTS) ×2 IMPLANT
GUIDELINER 6F (CATHETERS) ×2 IMPLANT
GUIDEWIRE VAS SION BLUE 190 (WIRE) ×2 IMPLANT
KIT ENCORE 26 ADVANTAGE (KITS) ×2 IMPLANT
KIT HEART LEFT (KITS) ×2 IMPLANT
KIT HEMO VALVE WATCHDOG (MISCELLANEOUS) ×2 IMPLANT
PACK CARDIAC CATHETERIZATION (CUSTOM PROCEDURE TRAY) ×2 IMPLANT
SHEATH PINNACLE 6F 10CM (SHEATH) ×2 IMPLANT
SHEATH PROBE COVER 6X72 (BAG) ×2 IMPLANT
STENT ONYX FRONTIER 2.25X18 (Permanent Stent) ×2 IMPLANT
STOPCOCK MORSE 400PSI 3WAY (MISCELLANEOUS) ×2 IMPLANT
TRANSDUCER W/STOPCOCK (MISCELLANEOUS) ×2 IMPLANT
TUBING CIL FLEX 10 FLL-RA (TUBING) ×2 IMPLANT
WIRE EMERALD 3MM-J .035X260CM (WIRE) ×2 IMPLANT
WIRE MICRO SET SILHO 5FR 7 (SHEATH) ×2 IMPLANT

## 2021-05-26 NOTE — Progress Notes (Addendum)
Progress Note  Patient Name: Tyler Guerra. Date of Encounter: 05/26/2021  [facility] Cardiologist: Sinclair Grooms, MD   Subjective   No acute overnight events. No chest pain or shortness of breath. Plan is for cardiac catheterization today.  Inpatient Medications    Scheduled Meds:  allopurinol  300 mg Oral Daily   aspirin EC  81 mg Oral Daily   atorvastatin  80 mg Oral Daily   clopidogrel  600 mg Oral STAT   escitalopram  5 mg Oral Daily   insulin aspart  0-15 Units Subcutaneous TID WC   losartan  25 mg Oral Daily   metoprolol tartrate  12.5 mg Oral BID   pantoprazole  40 mg Oral Daily   sodium chloride flush  3 mL Intravenous Q12H   tamsulosin  0.4 mg Oral BID   Continuous Infusions:  sodium chloride     sodium chloride 1 mL/kg/hr (05/26/21 0606)   heparin 1,450 Units/hr (05/26/21 0523)   PRN Meds: sodium chloride, acetaminophen, nitroGLYCERIN, ondansetron (ZOFRAN) IV, sodium chloride flush   Vital Signs    Vitals:   05/25/21 1200 05/25/21 1450 05/26/21 0511 05/26/21 0854  BP: 118/80   123/87  Pulse: 63   71  Resp: 12 18 16    Temp:  98.6 F (37 C) 98.7 F (37.1 C)   TempSrc:  Oral Oral   SpO2: 99%  100%   Weight:  96.5 kg    Height:  5\' 10"  (1.778 m)      Intake/Output Summary (Last 24 hours) at 05/26/2021 0905 Last data filed at 05/26/2021 0300 Gross per 24 hour  Intake 491.56 ml  Output --  Net 491.56 ml   Last 3 Weights 05/25/2021 05/24/2021 04/08/2021  Weight (lbs) 212 lb 12.8 oz 213 lb 215 lb  Weight (kg) 96.525 kg 96.616 kg 97.523 kg      Telemetry    Sinus rhythm with rates in the 60s at baseline. Brief spikes to the 80s (suspect this is with activity). - Personally Reviewed  ECG    Normal sinus rhythm, rate 69 bpm, with mild T wave inversion in aVL but no other ST/T changes. - Personally Reviewed  Physical Exam   GEN: No acute distress.   Neck: No JVD. Cardiac: RRR. No murmurs, rubs, or gallops. Radial pulses 2+ and  equal bilaterally. Respiratory: Clear to auscultation bilaterally. No wheezes, rhonchi, or rales. GI: Soft, non-distended, and non-tender. MS: No lower extremity edema. No deformity. Skin: Warm and dry. Neuro:  No focal deficits. Psych: Normal affect. Responds appropriately.  Labs    High Sensitivity Troponin:   Recent Labs  Lab 05/24/21 1814 05/24/21 1926  TROPONINIHS 887* 942*     Chemistry Recent Labs  Lab 05/24/21 1814 05/25/21 0251 05/26/21 0127  NA 140 137 136  K 3.5 3.3* 3.6  CL 106 102 104  CO2 26 25 25   GLUCOSE 102* 106* 135*  BUN 18 17 16   CREATININE 1.06 0.97 0.98  CALCIUM 9.5 9.3 8.8*  PROT 6.5  --   --   ALBUMIN 3.6  --   --   AST 30  --   --   ALT 23  --   --   ALKPHOS 95  --   --   BILITOT 1.0  --   --   GFRNONAA >60 >60 >60  ANIONGAP 8 10 7     Lipids  Recent Labs  Lab 05/25/21 0251  CHOL 154  TRIG 98  HDL 49  LDLCALC 85  CHOLHDL 3.1    Hematology Recent Labs  Lab 05/24/21 1814 05/25/21 1218 05/26/21 0127  WBC 6.4 6.8 7.9  RBC 4.58 4.43 4.34  HGB 13.8 13.2 13.1  HCT 40.7 40.4 39.1  MCV 88.9 91.2 90.1  MCH 30.1 29.8 30.2  MCHC 33.9 32.7 33.5  RDW 12.6 12.8 12.9  PLT 170 173 181   Thyroid No results for input(s): TSH, FREET4 in the last 168 hours.  BNPNo results for input(s): BNP, PROBNP in the last 168 hours.  DDimer No results for input(s): DDIMER in the last 168 hours.   Radiology    DG Chest Portable 1 View  Result Date: 05/24/2021 CLINICAL DATA:  Chest pain EXAM: PORTABLE CHEST 1 VIEW COMPARISON:  Chest radiograph dated 10/25/2019. FINDINGS: The heart size and mediastinal contours are within normal limits. Both lungs are clear. Median sternotomy wires and mediastinal clips are noted. Degenerative changes are seen in the spine. IMPRESSION: No active disease. Electronically Signed   By: Zerita Boers M.D.   On: 05/24/2021 18:27   ECHOCARDIOGRAM COMPLETE  Result Date: 05/25/2021    ECHOCARDIOGRAM REPORT   Patient Name:    [name]. Date of Exam: 05/25/2021 Medical Rec #:  [id number]          Height:       70.0 in Accession #:    [id number]         Weight:       213.0 lb Date of Birth:  [birth year]          BSA:          2.144 m Patient Age:    70 years           BP:           118/80 mmHg Patient Gender: M                  HR:           63 bpm. Exam Location:  Inpatient Procedure: 2D Echo, Cardiac Doppler and Color Doppler Indications:    Acute MI  History:        Patient has prior history of Echocardiogram examinations, most                 recent 01/28/2017. CAD and Previous Myocardial Infarction, Prior                 CABG, TIA, Signs/Symptoms:Chest Pain; Risk Factors:Diabetes,                 Hypertension and Dyslipidemia.  Sonographer:    Dustin Flock RDCS Referring Phys: [id number] [place]  1. Left ventricular ejection fraction, by estimation, is 60 to 65%. The left ventricle has normal function. The left ventricle has no regional wall motion abnormalities. Left ventricular diastolic parameters are indeterminate.  2. Right ventricular systolic function is normal. The right ventricular size is normal. There is normal pulmonary artery systolic pressure.  3. Right atrial size was mildly dilated.  4. The mitral valve is normal in structure. Trivial mitral valve regurgitation. No evidence of mitral stenosis.  5. The aortic valve is grossly normal. There is mild calcification of the aortic valve. Aortic valve regurgitation is not visualized. Mild aortic valve sclerosis is present, with no evidence of aortic valve stenosis.  6. The inferior vena cava is dilated in size with <50% respiratory variability, suggesting right atrial pressure of 15 mmHg. Comparison(s): No  significant change from prior study. Conclusion(s)/Recommendation(s): Otherwise normal echocardiogram, with minor abnormalities described in the report. FINDINGS  Left Ventricle: Left ventricular ejection fraction, by estimation, is 60 to 65%.  The left ventricle has normal function. The left ventricle has no regional wall motion abnormalities. The left ventricular internal cavity size was normal in size. There is  borderline left ventricular hypertrophy. Left ventricular diastolic parameters are indeterminate. Right Ventricle: The right ventricular size is normal. Right vetricular wall thickness was not well visualized. Right ventricular systolic function is normal. There is normal pulmonary artery systolic pressure. The tricuspid regurgitant velocity is 2.18 m/s, and with an assumed right atrial pressure of 15 mmHg, the estimated right ventricular systolic pressure is 02.5 mmHg. Left Atrium: Left atrial size was normal in size. Right Atrium: Right atrial size was mildly dilated. Pericardium: There is no evidence of pericardial effusion. Mitral Valve: The mitral valve is normal in structure. Trivial mitral valve regurgitation. No evidence of mitral valve stenosis. Tricuspid Valve: The tricuspid valve is normal in structure. Tricuspid valve regurgitation is trivial. No evidence of tricuspid stenosis. Aortic Valve: The aortic valve is grossly normal. There is mild calcification of the aortic valve. Aortic valve regurgitation is not visualized. Mild aortic valve sclerosis is present, with no evidence of aortic valve stenosis. Aortic valve peak gradient  measures 13.7 mmHg. Pulmonic Valve: The pulmonic valve was not well visualized. Pulmonic valve regurgitation is not visualized. Aorta: The aortic root, ascending aorta and aortic arch are all structurally normal, with no evidence of dilitation or obstruction. Venous: The inferior vena cava is dilated in size with less than 50% respiratory variability, suggesting right atrial pressure of 15 mmHg. IAS/Shunts: The atrial septum is grossly normal.  LEFT VENTRICLE PLAX 2D LVIDd:         5.20 cm   Diastology LVIDs:         3.30 cm   LV e' medial:    6.64 cm/s LV PW:         1.10 cm   LV E/e' medial:  12.9 LV IVS:         1.10 cm   LV e' lateral:   7.72 cm/s LVOT diam:     2.40 cm   LV E/e' lateral: 11.1 LV SV:         102 LV SV Index:   47 LVOT Area:     4.52 cm  RIGHT VENTRICLE RV Basal diam:  3.00 cm RV S prime:     10.70 cm/s TAPSE (M-mode): 1.6 cm LEFT ATRIUM             Index        RIGHT ATRIUM           Index LA diam:        3.80 cm 1.77 cm/m   RA Area:     15.60 cm LA Vol (A2C):   37.2 ml 17.35 ml/m  RA Volume:   43.60 ml  20.34 ml/m LA Vol (A4C):   45.9 ml 21.41 ml/m LA Biplane Vol: 42.6 ml 19.87 ml/m  AORTIC VALVE AV Area (Vmax): 2.57 cm AV Vmax:        185.00 cm/s AV Peak Grad:   13.7 mmHg LVOT Vmax:      105.00 cm/s LVOT Vmean:     72.400 cm/s LVOT VTI:       0.225 m  AORTA Ao Root diam: 3.20 cm MITRAL VALVE  TRICUSPID VALVE MV Area (PHT): 2.80 cm    TR Peak grad:   19.0 mmHg MV Decel Time: 271 msec    TR Vmax:        218.00 cm/s MV E velocity: 85.40 cm/s MV A velocity: 86.80 cm/s  SHUNTS MV E/A ratio:  0.98        Systemic VTI:  0.22 m                            Systemic Diam: 2.40 cm Buford Dresser MD Electronically signed by Buford Dresser MD Signature Date/Time: 05/25/2021/4:59:29 PM    Final     Cardiac Studies   Echocardiogram 05/25/2021: Impressions:  1. Left ventricular ejection fraction, by estimation, is 60 to 65%. The  left ventricle has normal function. The left ventricle has no regional  wall motion abnormalities. Left ventricular diastolic parameters are  indeterminate.   2. Right ventricular systolic function is normal. The right ventricular  size is normal. There is normal pulmonary artery systolic pressure.   3. Right atrial size was mildly dilated.   4. The mitral valve is normal in structure. Trivial mitral valve  regurgitation. No evidence of mitral stenosis.   5. The aortic valve is grossly normal. There is mild calcification of the  aortic valve. Aortic valve regurgitation is not visualized. Mild aortic  valve sclerosis is present, with no  evidence of aortic valve stenosis.   6. The inferior vena cava is dilated in size with <50% respiratory  variability, suggesting right atrial pressure of 15 mmHg.   Comparison(s): No significant change from prior study.   Conclusion(s)/Recommendation(s): Otherwise normal echocardiogram, with  minor abnormalities described in the report.   Patient Profile     70 y.o. male with a history of CAD s/p CABG x3 (LIMA to LAD, SVG to RCA, and free radial to OM),  TIA, hypertension, hyperlipidemia, type 2 diabetes mellitus, GERD, and anxiety who presented with chest pain and found to have NSTEMI.  Assessment & Plan    NSTEMI CAD s/p CABG x3 in 2000 - High-sensitivity troponin elevated at 887 >> 942. - EKG shows no acute ischemic changes. - Echo shows LVEF of 60-65% with normal wall motion. - Currently chest pain free. - Continue IV Heparin. - Continue aspirin, beta-blocker, and high-intensity statin. - Discussed with MD - will give Plavix 600mg  load now as well. - Plan is for left cardiac catheterization today. The patient understands that risks include but are not limited to stroke (1 in 1000), death (1 in 63), kidney failure [usually temporary] (1 in 500), bleeding (1 in 200), allergic reaction [possibly serious] (1 in 200), and agrees to proceed.   Hypertension - BP well controlled. - Continue Losartan 25mg  twice daily and Lopressor 12.5mg  twice daily.  Hyperlipidemia - Lipid panel this admission: Total Cholesterol 154, Triglycerides 98, HDL 49, LDL 85.  - LDL goal <70 given CAD.  - On Lipitor 40mg  daily at home. Increased to 80mg  daily. - Will need repeat lipid panel and LFTs in 6-8 weeks.  Type 2 Diabetes Mellitus  - Hemoglobin 6.7 in 04/2021. - Not on any medications at home. - Continue to monitor CBGs.   For questions or updates, please contact [place] Please consult [email or web address] for contact info under    Pt independently interviewed and examined. Agree with the  findings, assessment, and plan as outlined above by Sande Rives, PA-C.  On my exam, the patient is alert,  oriented, in no distress.  JVP is normal, HEENT is normal, lungs are clear bilaterally, heart is regular rate and rhythm no murmur gallop, abdomen soft nontender, extremities have no edema.  As outlined, this patient with remote CABG presents with non-STEMI.  He recalls feeling similar chest pain symptoms predating his coronary bypass surgery.  He has not had no interval heart catheterizations done.  We discussed the indication for cardiac catheterization and possible PCI.  The patient will be done via a femoral approach since he has had LIMA grafting as well as left radial artery grafting.  We will load him with clopidogrel 600 mg this morning in anticipation of PCI. I have reviewed the risks, indications, and alternatives to cardiac catheterization, possible angioplasty, and stenting with the patient. Risks include but are not limited to bleeding, infection, vascular injury, stroke, myocardial infection, arrhythmia, kidney injury, radiation-related injury in the case of prolonged fluoroscopy use, emergency cardiac surgery, and death. The patient understands the risks of serious complication is 1-2 in 6754 with diagnostic cardiac cath and 1-2% or less with angioplasty/stenting.      Signed, Sherren Mocha, MD  05/26/2021, 9:05 AM

## 2021-05-26 NOTE — Discharge Summary (Signed)
Discharge Summary    Patient ID: Tyler Guerra. MRN: [id number]; DOB: Feb 29, 1951  Admit date: 05/24/2021 Discharge date: 05/27/2021  PCP:  Tonia [place], MD   [facility] Providers Cardiologist:  Sinclair Grooms, MD   Discharge Diagnoses    Principal Problem:   NSTEMI (non-ST elevated myocardial infarction) [facility]) Active Problems:   Hyperlipidemia   Hypertension   GERD   Hearing loss   Diabetes mellitus without complication ([place])   S/P CABG (coronary artery bypass graft)   CAD (coronary artery disease)    Diagnostic Studies/Procedures    Echocardiogram 05/25/2021: Impressions: 1. Left ventricular ejection fraction, by estimation, is 60 to 65%. The  left ventricle has normal function. The left ventricle has no regional  wall motion abnormalities. Left ventricular diastolic parameters are  indeterminate.   2. Right ventricular systolic function is normal. The right ventricular  size is normal. There is normal pulmonary artery systolic pressure.   3. Right atrial size was mildly dilated.   4. The mitral valve is normal in structure. Trivial mitral valve  regurgitation. No evidence of mitral stenosis.   5. The aortic valve is grossly normal. There is mild calcification of the  aortic valve. Aortic valve regurgitation is not visualized. Mild aortic  valve sclerosis is present, with no evidence of aortic valve stenosis.   6. The inferior vena cava is dilated in size with <50% respiratory  variability, suggesting right atrial pressure of 15 mmHg.   Comparison(s): No significant change from prior study.   Conclusion(s)/Recommendation(s): Otherwise normal echocardiogram, with minor abnormalities described in the report.  _____________  Left Cardiac Catheterization 05/16/2021:   Prox RCA lesion is 100% stenosed.   Origin to Prox Graft lesion is 100% stenosed.   1st Diag lesion is 80% stenosed.   Ost Cx lesion is 100% stenosed.   Post intervention, there is a  0% residual stenosis.   LV end diastolic pressure is severely elevated.   1.  Patent LIMA to LAD, vein graft to PDA, and radial to obtuse marginal with occluded vein graft to diagonal.  There is also severe native vessel disease with high-grade disease of the unrevascularized first diagonal treated with 1 drug-eluting stent. 2.  Elevated LVEDP with preserved ejection fraction.   Recommendations: Dual antiplatelet therapy for 1 year and aggressive treatment of cardiovascular risk factors.  Diagnostic Dominance: Right Intervention     History of Present Illness     Tyler Guerra. is a 70 y.o. male with a history of CAD s/p CABG x3 (LIMA to LAD, SVG to RCA, and free radial to OM),  TIA, hypertension, hyperlipidemia, type 2 diabetes mellitus, GERD, and anxiety who was admitted on 05/24/2021 with NSTEMI after presenting with chest pain.   Patient was in his usual state of health until 2 days prior to presentation when he started having intermittent chest pain. He initially thought it was heart burn/indigestion. Then on the day of presentation, he was doing some heavy work in the yard (lifting heavy objects) and shortly after had excruciating substernal chest pain radiating to his left upper extremity.   In the ED, EKG showed no acute ischemic changes. High-sensitivity troponin was elevated at 887 >> 942. CBC and BMET were unremarkable. He was started on IV Heparin and treated with Aspirin 324 mg and then admitted for further management of NSTEMI.  Hospital Course     Consultants: None  NSTEMI CAD s/p CABG x3 in 2000 Patient was admitted with NSTEMI  as above. High-sensitivity troponin peaked at 942. Echo showed LVEF of 60-65% with normal wall motion. Cardiac catheterization on 05/26/2021 showed occluded SVG to Diag with patent LIMA to LAD and SVG to PDA as well as severe native vessel disease with severe stenosis of 1st Diag. Patient underwent successful PCI with DES to 1st Diag lesion.  LVEDP was noted to be severely elevated but he did not appear volume overloaded on exam. He tolerated the procedure well with no recurrent chest pain. Patient was started on DAPT with Aspirin and Plavix as well as Lopressor. Home Lipitor was also increased. Will stop Nexium and start Protonix instead now that he is on Plavix.   Hypertension BP well controlled. Home HCTZ was stopped and he was tarted on Losartan 25mg  twice daily and Lopressor 12.5mg  twice daily. Will continue this at discharge.  Hyperlipidemia Lipid panel this admission: Total Cholesterol 154, Triglycerides 98, HDL 49, LDL 85. LDL goal <70 given CAD. Lipitor was increased to 80mg  daily. Will need repeat lipid panel and LFTs in 6-8 weeks.   Type 2 Diabetes Mellitus  Hemoglobin 6.7 in 04/2021. Not on any medications at home. I had a long discussion with patient about possibly starting an oral medication (Metformin or an SGLT2 inhibitor). However, patient preferred to discuss this with PCP.  Patient was seen and examined by Dr. Angelena Form and was determined to be stable for discharge. Outpatient follow-up has been arranged. Medications as below.   Did the patient have an acute coronary syndrome (MI, NSTEMI, STEMI, etc) this admission?:  Yes                               AHA/ACC Clinical Performance & Quality Measures: Aspirin prescribed? - Yes ADP Receptor Inhibitor (Plavix/Clopidogrel, Brilinta/Ticagrelor or Effient/Prasugrel) prescribed (includes medically managed patients)? - Yes Beta Blocker prescribed? - Yes High Intensity Statin (Lipitor 40-80mg  or Crestor 20-40mg ) prescribed? - Yes EF assessed during THIS hospitalization? - Yes For EF <40%, was ACEI/ARB prescribed? - Not Applicable (EF >/= 96%) For EF <40%, Aldosterone Antagonist (Spironolactone or Eplerenone) prescribed? - Not Applicable (EF >/= 29%) Cardiac Rehab Phase II ordered (including medically managed patients)? - Yes  _____________  Discharge Vitals Blood  pressure 132/81, pulse 68, temperature 98.8 F (37.1 C), temperature source Oral, resp. rate 19, height 5\' 10"  (1.778 m), weight 96.3 kg, SpO2 99 %.  Filed Weights   05/24/21 2115 05/25/21 1450 05/27/21 0546  Weight: 96.6 kg 96.5 kg 96.3 kg    Labs & Radiologic Studies    CBC Recent Labs    05/24/21 1814 05/25/21 1218 05/26/21 0127 05/27/21 0316  WBC 6.4 6.8 7.9 7.6  NEUTROABS 4.2 4.3  --   --   HGB 13.8 13.2 13.1 12.6*  HCT 40.7 40.4 39.1 38.3*  MCV 88.9 91.2 90.1 91.6  PLT 170 173 181 528   Basic Metabolic Panel Recent Labs    05/26/21 0127 05/27/21 0316  NA 136 140  K 3.6 4.6  CL 104 107  CO2 25 27  GLUCOSE 135* 126*  BUN 16 17  CREATININE 0.98 1.15  CALCIUM 8.8* 9.1   Liver Function Tests Recent Labs    05/24/21 1814  AST 30  ALT 23  ALKPHOS 95  BILITOT 1.0  PROT 6.5  ALBUMIN 3.6   Recent Labs    05/24/21 1737  LIPASE 29   High Sensitivity Troponin:   Recent Labs  Lab 05/24/21 1814 05/24/21 1926  TROPONINIHS 887* 942*    BNP Invalid input(s): POCBNP D-Dimer No results for input(s): DDIMER in the last 72 hours. Hemoglobin A1C No results for input(s): HGBA1C in the last 72 hours. Fasting Lipid Panel Recent Labs    05/25/21 0251  CHOL 154  HDL 49  LDLCALC 85  TRIG 98  CHOLHDL 3.1   Thyroid Function Tests No results for input(s): TSH, T4TOTAL, T3FREE, THYROIDAB in the last 72 hours.  Invalid input(s): FREET3 _____________  CARDIAC CATHETERIZATION  Result Date: 05/26/2021   Prox RCA lesion is 100% stenosed.   Origin to Prox Graft lesion is 100% stenosed.   1st Diag lesion is 80% stenosed.   Ost Cx lesion is 100% stenosed.   Post intervention, there is a 0% residual stenosis.   LV end diastolic pressure is severely elevated. 1.  Patent LIMA to LAD, vein graft to PDA, and radial to obtuse marginal with occluded vein graft to diagonal.  There is also severe native vessel disease with high-grade disease of the unrevascularized first  diagonal treated with 1 drug-eluting stent. 2.  Elevated LVEDP with preserved ejection fraction. Recommendations: Dual antiplatelet therapy for 1 year and aggressive treatment of cardiovascular risk factors.   DG Chest Portable 1 View  Result Date: 05/24/2021 CLINICAL DATA:  Chest pain EXAM: PORTABLE CHEST 1 VIEW COMPARISON:  Chest radiograph dated 10/25/2019. FINDINGS: The heart size and mediastinal contours are within normal limits. Both lungs are clear. Median sternotomy wires and mediastinal clips are noted. Degenerative changes are seen in the spine. IMPRESSION: No active disease. Electronically Signed   By: Zerita Boers M.D.   On: 05/24/2021 18:27   ECHOCARDIOGRAM COMPLETE  Result Date: 05/25/2021    ECHOCARDIOGRAM REPORT   Patient Name:   Aylen Rambert. Date of Exam: 05/25/2021 Medical Rec #:  [id number]          Height:       70.0 in Accession #:    [id number]         Weight:       213.0 lb Date of Birth:  Dec 25, 1950          BSA:          2.144 m Patient Age:    70 years           BP:           118/80 mmHg Patient Gender: M                  HR:           63 bpm. Exam Location:  Inpatient Procedure: 2D Echo, Cardiac Doppler and Color Doppler Indications:    Acute MI  History:        Patient has prior history of Echocardiogram examinations, most                 recent 01/28/2017. CAD and Previous Myocardial Infarction, Prior                 CABG, TIA, Signs/Symptoms:Chest Pain; Risk Factors:Diabetes,                 Hypertension and Dyslipidemia.  Sonographer:    Dustin Flock RDCS Referring Phys: [id number] [place]  1. Left ventricular ejection fraction, by estimation, is 60 to 65%. The left ventricle has normal function. The left ventricle has no regional wall motion abnormalities. Left ventricular diastolic parameters are indeterminate.  2. Right ventricular systolic function is normal. The  right ventricular size is normal. There is normal pulmonary artery systolic  pressure.  3. Right atrial size was mildly dilated.  4. The mitral valve is normal in structure. Trivial mitral valve regurgitation. No evidence of mitral stenosis.  5. The aortic valve is grossly normal. There is mild calcification of the aortic valve. Aortic valve regurgitation is not visualized. Mild aortic valve sclerosis is present, with no evidence of aortic valve stenosis.  6. The inferior vena cava is dilated in size with <50% respiratory variability, suggesting right atrial pressure of 15 mmHg. Comparison(s): No significant change from prior study. Conclusion(s)/Recommendation(s): Otherwise normal echocardiogram, with minor abnormalities described in the report. FINDINGS  Left Ventricle: Left ventricular ejection fraction, by estimation, is 60 to 65%. The left ventricle has normal function. The left ventricle has no regional wall motion abnormalities. The left ventricular internal cavity size was normal in size. There is  borderline left ventricular hypertrophy. Left ventricular diastolic parameters are indeterminate. Right Ventricle: The right ventricular size is normal. Right vetricular wall thickness was not well visualized. Right ventricular systolic function is normal. There is normal pulmonary artery systolic pressure. The tricuspid regurgitant velocity is 2.18 m/s, and with an assumed right atrial pressure of 15 mmHg, the estimated right ventricular systolic pressure is 16.1 mmHg. Left Atrium: Left atrial size was normal in size. Right Atrium: Right atrial size was mildly dilated. Pericardium: There is no evidence of pericardial effusion. Mitral Valve: The mitral valve is normal in structure. Trivial mitral valve regurgitation. No evidence of mitral valve stenosis. Tricuspid Valve: The tricuspid valve is normal in structure. Tricuspid valve regurgitation is trivial. No evidence of tricuspid stenosis. Aortic Valve: The aortic valve is grossly normal. There is mild calcification of the aortic valve.  Aortic valve regurgitation is not visualized. Mild aortic valve sclerosis is present, with no evidence of aortic valve stenosis. Aortic valve peak gradient  measures 13.7 mmHg. Pulmonic Valve: The pulmonic valve was not well visualized. Pulmonic valve regurgitation is not visualized. Aorta: The aortic root, ascending aorta and aortic arch are all structurally normal, with no evidence of dilitation or obstruction. Venous: The inferior vena cava is dilated in size with less than 50% respiratory variability, suggesting right atrial pressure of 15 mmHg. IAS/Shunts: The atrial septum is grossly normal.  LEFT VENTRICLE PLAX 2D LVIDd:         5.20 cm   Diastology LVIDs:         3.30 cm   LV e' medial:    6.64 cm/s LV PW:         1.10 cm   LV E/e' medial:  12.9 LV IVS:        1.10 cm   LV e' lateral:   7.72 cm/s LVOT diam:     2.40 cm   LV E/e' lateral: 11.1 LV SV:         102 LV SV Index:   47 LVOT Area:     4.52 cm  RIGHT VENTRICLE RV Basal diam:  3.00 cm RV S prime:     10.70 cm/s TAPSE (M-mode): 1.6 cm LEFT ATRIUM             Index        RIGHT ATRIUM           Index LA diam:        3.80 cm 1.77 cm/m   RA Area:     15.60 cm LA Vol (A2C):   37.2 ml 17.35 ml/m  RA  Volume:   43.60 ml  20.34 ml/m LA Vol (A4C):   45.9 ml 21.41 ml/m LA Biplane Vol: 42.6 ml 19.87 ml/m  AORTIC VALVE AV Area (Vmax): 2.57 cm AV Vmax:        185.00 cm/s AV Peak Grad:   13.7 mmHg LVOT Vmax:      105.00 cm/s LVOT Vmean:     72.400 cm/s LVOT VTI:       0.225 m  AORTA Ao Root diam: 3.20 cm MITRAL VALVE               TRICUSPID VALVE MV Area (PHT): 2.80 cm    TR Peak grad:   19.0 mmHg MV Decel Time: 271 msec    TR Vmax:        218.00 cm/s MV E velocity: 85.40 cm/s MV A velocity: 86.80 cm/s  SHUNTS MV E/A ratio:  0.98        Systemic VTI:  0.22 m                            Systemic Diam: 2.40 cm Buford Dresser MD Electronically signed by Buford Dresser MD Signature Date/Time: 05/25/2021/4:59:29 PM    Final    Disposition    Patient is being discharged home today in good condition.  Follow-up Plans & Appointments     Follow-up Information     Imogene Burn, PA-C Follow up.   Specialty: Cardiology Why: Hospital follow-up with Cardiology scheduled at 06/10/2021 at 1:15pm. Please arrive 15 minutes early for check-in. If this date/time does not work for you, pleae call our office to reschedule. Contact information: [address] [place]  [address] [phone]                Discharge Instructions     Amb Referral to Cardiac Rehabilitation   Complete by: As directed    Diagnosis:  Coronary Stents NSTEMI     After initial evaluation and assessments completed: Virtual Based Care may be provided alone or in conjunction with Phase 2 Cardiac Rehab based on patient barriers.: Yes   Diet - low sodium heart healthy   Complete by: As directed    Increase activity slowly   Complete by: As directed        Discharge Medications   Allergies as of 05/27/2021   No Known Allergies      Medication List     STOP taking these medications    esomeprazole 20 MG capsule Commonly known as: [place] Replaced by: pantoprazole 40 MG tablet   hydrochlorothiazide 25 MG tablet Commonly known as: HYDRODIURIL       TAKE these medications    allopurinol 300 MG tablet Commonly known as: ZYLOPRIM TAKE 1 TABLET DAILY   aspirin EC 81 MG tablet Take 1 tablet (81 mg total) by mouth daily.   atorvastatin 80 MG tablet Commonly known as: LIPITOR Take 1 tablet (80 mg total) by mouth daily. Start taking on: May 28, 2021 What changed:  medication strength how much to take   BLACK CHERRY CONCENTRATE PO Take 1 tablet by mouth at bedtime.   clopidogrel 75 MG tablet Commonly known as: PLAVIX Take 1 tablet (75 mg total) by mouth daily with breakfast. Start taking on: May 28, 2021   escitalopram 5 MG tablet Commonly known as: LEXAPRO TAKE 1 TABLET DAILY   losartan 25 MG  tablet Commonly known as: COZAAR Take 1 tablet (25 mg total)  by mouth daily. Start taking on: May 28, 2021   metoprolol tartrate 25 MG tablet Commonly known as: LOPRESSOR Take 0.5 tablets (12.5 mg total) by mouth 2 (two) times daily.   nitroGLYCERIN 0.4 MG SL tablet Commonly known as: NITROSTAT Place 1 tablet (0.4 mg total) under the tongue every 5 (five) minutes as needed for chest pain.   OCUVITE PO Take 1 capsule by mouth daily.   pantoprazole 40 MG tablet Commonly known as: PROTONIX Take 1 tablet (40 mg total) by mouth daily. Start taking on: May 28, 2021 Replaces: esomeprazole 20 MG capsule   sodium chloride 0.65 % Soln nasal spray Commonly known as: OCEAN Place 1 spray into both nostrils as needed for congestion.   Systane Balance 0.6 % Soln Generic drug: Propylene Glycol Place 1 drop into both eyes daily as needed (dry eyes).   tamsulosin 0.4 MG Caps capsule Commonly known as: FLOMAX TAKE 1 CAPSULE TWICE A DAY What changed: when to take this   VITAMIN D PO Take 1 capsule by mouth daily.           Outstanding Labs/Studies   Repeat lipid panel and LFTs in 6-8 weeks.   Duration of Discharge Encounter   Greater than 30 minutes including physician time.  Signed, Darreld Mclean, PA-C 05/27/2021, 1:06 PM

## 2021-05-26 NOTE — Plan of Care (Signed)
  Problem: Clinical Measurements: Goal: Ability to maintain clinical measurements within normal limits will improve Outcome: Progressing Goal: Diagnostic test results will improve Outcome: Progressing   Problem: Coping: Goal: Level of anxiety will decrease Outcome: Progressing   Problem: Nutrition: Goal: Adequate nutrition will be maintained Outcome: Completed/Met   Problem: Elimination: Goal: Will not experience complications related to urinary retention Outcome: Completed/Met

## 2021-05-26 NOTE — H&P (View-Only) (Signed)
Progress Note  Patient Name: Tyler Guerra. Date of Encounter: 05/26/2021  [facility] Cardiologist: Sinclair Grooms, MD   Subjective   No acute overnight events. No chest pain or shortness of breath. Plan is for cardiac catheterization today.  Inpatient Medications    Scheduled Meds:  allopurinol  300 mg Oral Daily   aspirin EC  81 mg Oral Daily   atorvastatin  80 mg Oral Daily   clopidogrel  600 mg Oral STAT   escitalopram  5 mg Oral Daily   insulin aspart  0-15 Units Subcutaneous TID WC   losartan  25 mg Oral Daily   metoprolol tartrate  12.5 mg Oral BID   pantoprazole  40 mg Oral Daily   sodium chloride flush  3 mL Intravenous Q12H   tamsulosin  0.4 mg Oral BID   Continuous Infusions:  sodium chloride     sodium chloride 1 mL/kg/hr (05/26/21 0606)   heparin 1,450 Units/hr (05/26/21 0523)   PRN Meds: sodium chloride, acetaminophen, nitroGLYCERIN, ondansetron (ZOFRAN) IV, sodium chloride flush   Vital Signs    Vitals:   05/25/21 1200 05/25/21 1450 05/26/21 0511 05/26/21 0854  BP: 118/80   123/87  Pulse: 63   71  Resp: 12 18 16    Temp:  98.6 F (37 C) 98.7 F (37.1 C)   TempSrc:  Oral Oral   SpO2: 99%  100%   Weight:  96.5 kg    Height:  5\' 10"  (1.778 m)      Intake/Output Summary (Last 24 hours) at 05/26/2021 0905 Last data filed at 05/26/2021 0300 Gross per 24 hour  Intake 491.56 ml  Output --  Net 491.56 ml   Last 3 Weights 05/25/2021 05/24/2021 04/08/2021  Weight (lbs) 212 lb 12.8 oz 213 lb 215 lb  Weight (kg) 96.525 kg 96.616 kg 97.523 kg      Telemetry    Sinus rhythm with rates in the 60s at baseline. Brief spikes to the 80s (suspect this is with activity). - Personally Reviewed  ECG    Normal sinus rhythm, rate 69 bpm, with mild T wave inversion in aVL but no other ST/T changes. - Personally Reviewed  Physical Exam   GEN: No acute distress.   Neck: No JVD. Cardiac: RRR. No murmurs, rubs, or gallops. Radial pulses 2+ and  equal bilaterally. Respiratory: Clear to auscultation bilaterally. No wheezes, rhonchi, or rales. GI: Soft, non-distended, and non-tender. MS: No lower extremity edema. No deformity. Skin: Warm and dry. Neuro:  No focal deficits. Psych: Normal affect. Responds appropriately.  Labs    High Sensitivity Troponin:   Recent Labs  Lab 05/24/21 1814 05/24/21 1926  TROPONINIHS 887* 942*     Chemistry Recent Labs  Lab 05/24/21 1814 05/25/21 0251 05/26/21 0127  NA 140 137 136  K 3.5 3.3* 3.6  CL 106 102 104  CO2 26 25 25   GLUCOSE 102* 106* 135*  BUN 18 17 16   CREATININE 1.06 0.97 0.98  CALCIUM 9.5 9.3 8.8*  PROT 6.5  --   --   ALBUMIN 3.6  --   --   AST 30  --   --   ALT 23  --   --   ALKPHOS 95  --   --   BILITOT 1.0  --   --   GFRNONAA >60 >60 >60  ANIONGAP 8 10 7     Lipids  Recent Labs  Lab 05/25/21 0251  CHOL 154  TRIG 98  HDL 49  LDLCALC 85  CHOLHDL 3.1    Hematology Recent Labs  Lab 05/24/21 1814 05/25/21 1218 05/26/21 0127  WBC 6.4 6.8 7.9  RBC 4.58 4.43 4.34  HGB 13.8 13.2 13.1  HCT 40.7 40.4 39.1  MCV 88.9 91.2 90.1  MCH 30.1 29.8 30.2  MCHC 33.9 32.7 33.5  RDW 12.6 12.8 12.9  PLT 170 173 181   Thyroid No results for input(s): TSH, FREET4 in the last 168 hours.  BNPNo results for input(s): BNP, PROBNP in the last 168 hours.  DDimer No results for input(s): DDIMER in the last 168 hours.   Radiology    DG Chest Portable 1 View  Result Date: 05/24/2021 CLINICAL DATA:  Chest pain EXAM: PORTABLE CHEST 1 VIEW COMPARISON:  Chest radiograph dated 10/25/2019. FINDINGS: The heart size and mediastinal contours are within normal limits. Both lungs are clear. Median sternotomy wires and mediastinal clips are noted. Degenerative changes are seen in the spine. IMPRESSION: No active disease. Electronically Signed   By: Zerita Boers M.D.   On: 05/24/2021 18:27   ECHOCARDIOGRAM COMPLETE  Result Date: 05/25/2021    ECHOCARDIOGRAM REPORT   Patient Name:    Tyler Guerra. Date of Exam: 05/25/2021 Medical Rec #:  [id number]          Height:       70.0 in Accession #:    [id number]         Weight:       213.0 lb Date of Birth:  08/12/1950          BSA:          2.144 m Patient Age:    70 years           BP:           118/80 mmHg Patient Gender: M                  HR:           63 bpm. Exam Location:  Inpatient Procedure: 2D Echo, Cardiac Doppler and Color Doppler Indications:    Acute MI  History:        Patient has prior history of Echocardiogram examinations, most                 recent 01/28/2017. CAD and Previous Myocardial Infarction, Prior                 CABG, TIA, Signs/Symptoms:Chest Pain; Risk Factors:Diabetes,                 Hypertension and Dyslipidemia.  Sonographer:    Dustin Flock RDCS Referring Phys: [id number] [place]  1. Left ventricular ejection fraction, by estimation, is 60 to 65%. The left ventricle has normal function. The left ventricle has no regional wall motion abnormalities. Left ventricular diastolic parameters are indeterminate.  2. Right ventricular systolic function is normal. The right ventricular size is normal. There is normal pulmonary artery systolic pressure.  3. Right atrial size was mildly dilated.  4. The mitral valve is normal in structure. Trivial mitral valve regurgitation. No evidence of mitral stenosis.  5. The aortic valve is grossly normal. There is mild calcification of the aortic valve. Aortic valve regurgitation is not visualized. Mild aortic valve sclerosis is present, with no evidence of aortic valve stenosis.  6. The inferior vena cava is dilated in size with <50% respiratory variability, suggesting right atrial pressure of 15 mmHg. Comparison(s): No  significant change from prior study. Conclusion(s)/Recommendation(s): Otherwise normal echocardiogram, with minor abnormalities described in the report. FINDINGS  Left Ventricle: Left ventricular ejection fraction, by estimation, is 60 to 65%.  The left ventricle has normal function. The left ventricle has no regional wall motion abnormalities. The left ventricular internal cavity size was normal in size. There is  borderline left ventricular hypertrophy. Left ventricular diastolic parameters are indeterminate. Right Ventricle: The right ventricular size is normal. Right vetricular wall thickness was not well visualized. Right ventricular systolic function is normal. There is normal pulmonary artery systolic pressure. The tricuspid regurgitant velocity is 2.18 m/s, and with an assumed right atrial pressure of 15 mmHg, the estimated right ventricular systolic pressure is 69.6 mmHg. Left Atrium: Left atrial size was normal in size. Right Atrium: Right atrial size was mildly dilated. Pericardium: There is no evidence of pericardial effusion. Mitral Valve: The mitral valve is normal in structure. Trivial mitral valve regurgitation. No evidence of mitral valve stenosis. Tricuspid Valve: The tricuspid valve is normal in structure. Tricuspid valve regurgitation is trivial. No evidence of tricuspid stenosis. Aortic Valve: The aortic valve is grossly normal. There is mild calcification of the aortic valve. Aortic valve regurgitation is not visualized. Mild aortic valve sclerosis is present, with no evidence of aortic valve stenosis. Aortic valve peak gradient  measures 13.7 mmHg. Pulmonic Valve: The pulmonic valve was not well visualized. Pulmonic valve regurgitation is not visualized. Aorta: The aortic root, ascending aorta and aortic arch are all structurally normal, with no evidence of dilitation or obstruction. Venous: The inferior vena cava is dilated in size with less than 50% respiratory variability, suggesting right atrial pressure of 15 mmHg. IAS/Shunts: The atrial septum is grossly normal.  LEFT VENTRICLE PLAX 2D LVIDd:         5.20 cm   Diastology LVIDs:         3.30 cm   LV e' medial:    6.64 cm/s LV PW:         1.10 cm   LV E/e' medial:  12.9 LV IVS:         1.10 cm   LV e' lateral:   7.72 cm/s LVOT diam:     2.40 cm   LV E/e' lateral: 11.1 LV SV:         102 LV SV Index:   47 LVOT Area:     4.52 cm  RIGHT VENTRICLE RV Basal diam:  3.00 cm RV S prime:     10.70 cm/s TAPSE (M-mode): 1.6 cm LEFT ATRIUM             Index        RIGHT ATRIUM           Index LA diam:        3.80 cm 1.77 cm/m   RA Area:     15.60 cm LA Vol (A2C):   37.2 ml 17.35 ml/m  RA Volume:   43.60 ml  20.34 ml/m LA Vol (A4C):   45.9 ml 21.41 ml/m LA Biplane Vol: 42.6 ml 19.87 ml/m  AORTIC VALVE AV Area (Vmax): 2.57 cm AV Vmax:        185.00 cm/s AV Peak Grad:   13.7 mmHg LVOT Vmax:      105.00 cm/s LVOT Vmean:     72.400 cm/s LVOT VTI:       0.225 m  AORTA Ao Root diam: 3.20 cm MITRAL VALVE  TRICUSPID VALVE MV Area (PHT): 2.80 cm    TR Peak grad:   19.0 mmHg MV Decel Time: 271 msec    TR Vmax:        218.00 cm/s MV E velocity: 85.40 cm/s MV A velocity: 86.80 cm/s  SHUNTS MV E/A ratio:  0.98        Systemic VTI:  0.22 m                            Systemic Diam: 2.40 cm Buford Dresser MD Electronically signed by Buford Dresser MD Signature Date/Time: 05/25/2021/4:59:29 PM    Final     Cardiac Studies   Echocardiogram 05/25/2021: Impressions:  1. Left ventricular ejection fraction, by estimation, is 60 to 65%. The  left ventricle has normal function. The left ventricle has no regional  wall motion abnormalities. Left ventricular diastolic parameters are  indeterminate.   2. Right ventricular systolic function is normal. The right ventricular  size is normal. There is normal pulmonary artery systolic pressure.   3. Right atrial size was mildly dilated.   4. The mitral valve is normal in structure. Trivial mitral valve  regurgitation. No evidence of mitral stenosis.   5. The aortic valve is grossly normal. There is mild calcification of the  aortic valve. Aortic valve regurgitation is not visualized. Mild aortic  valve sclerosis is present, with no  evidence of aortic valve stenosis.   6. The inferior vena cava is dilated in size with <50% respiratory  variability, suggesting right atrial pressure of 15 mmHg.   Comparison(s): No significant change from prior study.   Conclusion(s)/Recommendation(s): Otherwise normal echocardiogram, with  minor abnormalities described in the report.   Patient Profile     70 y.o. male with a history of CAD s/p CABG x3 (LIMA to LAD, SVG to RCA, and free radial to OM),  TIA, hypertension, hyperlipidemia, type 2 diabetes mellitus, GERD, and anxiety who presented with chest pain and found to have NSTEMI.  Assessment & Plan    NSTEMI CAD s/p CABG x3 in 2000 - High-sensitivity troponin elevated at 887 >> 942. - EKG shows no acute ischemic changes. - Echo shows LVEF of 60-65% with normal wall motion. - Currently chest pain free. - Continue IV Heparin. - Continue aspirin, beta-blocker, and high-intensity statin. - Discussed with MD - will give Plavix 600mg  load now as well. - Plan is for left cardiac catheterization today. The patient understands that risks include but are not limited to stroke (1 in 1000), death (1 in 49), kidney failure [usually temporary] (1 in 500), bleeding (1 in 200), allergic reaction [possibly serious] (1 in 200), and agrees to proceed.   Hypertension - BP well controlled. - Continue Losartan 25mg  twice daily and Lopressor 12.5mg  twice daily.  Hyperlipidemia - Lipid panel this admission: Total Cholesterol 154, Triglycerides 98, HDL 49, LDL 85.  - LDL goal <70 given CAD.  - On Lipitor 40mg  daily at home. Increased to 80mg  daily. - Will need repeat lipid panel and LFTs in 6-8 weeks.  Type 2 Diabetes Mellitus  - Hemoglobin 6.7 in 04/2021. - Not on any medications at home. - Continue to monitor CBGs.   For questions or updates, please contact [place] Please consult [email or web address] for contact info under    Pt independently interviewed and examined. Agree with the  findings, assessment, and plan as outlined above by Sande Rives, PA-C.  On my exam, the patient is alert,  oriented, in no distress.  JVP is normal, HEENT is normal, lungs are clear bilaterally, heart is regular rate and rhythm no murmur gallop, abdomen soft nontender, extremities have no edema.  As outlined, this patient with remote CABG presents with non-STEMI.  He recalls feeling similar chest pain symptoms predating his coronary bypass surgery.  He has not had no interval heart catheterizations done.  We discussed the indication for cardiac catheterization and possible PCI.  The patient will be done via a femoral approach since he has had LIMA grafting as well as left radial artery grafting.  We will load him with clopidogrel 600 mg this morning in anticipation of PCI. I have reviewed the risks, indications, and alternatives to cardiac catheterization, possible angioplasty, and stenting with the patient. Risks include but are not limited to bleeding, infection, vascular injury, stroke, myocardial infection, arrhythmia, kidney injury, radiation-related injury in the case of prolonged fluoroscopy use, emergency cardiac surgery, and death. The patient understands the risks of serious complication is 1-2 in 1610 with diagnostic cardiac cath and 1-2% or less with angioplasty/stenting.      Signed, Sherren Mocha, MD  05/26/2021, 9:05 AM

## 2021-05-26 NOTE — Progress Notes (Signed)
ANTICOAGULATION CONSULT NOTE  Pharmacy Consult for Heparin Indication: chest pain/ACS  No Known Allergies  Patient Measurements: Height: 5\' 10"  (177.8 cm) Weight: 96.5 kg (212 lb 12.8 oz) IBW/kg (Calculated) : 73 Heparin Dosing Weight: 92.9 kg  Vital Signs: Temp: 98.7 F (37.1 C) (10/24 0511) Temp Source: Oral (10/24 0511)  Labs: Recent Labs    05/24/21 1814 05/24/21 1926 05/25/21 0251 05/25/21 0251 05/25/21 1218 05/25/21 1804 05/26/21 0127  HGB 13.8  --   --   --  13.2  --  13.1  HCT 40.7  --   --   --  40.4  --  39.1  PLT 170  --   --   --  173  --  181  LABPROT 13.1  --   --   --   --   --   --   INR 1.0  --   --   --   --   --   --   HEPARINUNFRC  --   --  0.32   < > 0.35 0.19* 0.51  CREATININE 1.06  --  0.97  --   --   --  0.98  TROPONINIHS 887* 942*  --   --   --   --   --    < > = values in this interval not displayed.     Estimated Creatinine Clearance: 82.9 mL/min (by C-G formula based on SCr of 0.98 mg/dL).   Medical History: Past Medical History:  Diagnosis Date   Anxiety state 03/16/2016   Aphasia 01/26/2017   Benign localized prostatic hyperplasia with lower urinary tract symptoms (LUTS)    BPV (benign positional vertigo) 11/02/2014   Carotid artery stenosis    PER DUPLEX 10-22-2015  BILATERAL ICA 1-39%   Coronary artery disease    CARDIOLOGIST-  DR Daneen Schick--  VISIT EVERY OTHER YEAR-- LOV 2015   Coronary atherosclerosis 07/19/2010   Qualifier: Diagnosis of  By: [name] MD, [place]     Depression 01/26/2017   Diabetes mellitus without complication ([place]) 10/16/4006   GERD 07/19/2010   Qualifier: Diagnosis of  By: [name] MD, Phillip Heal     GERD (gastroesophageal reflux disease)    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   Gout    Gout 07/18/2010   Qualifier: Diagnosis of  By: [name] MD, Phillip Heal     H/O eye injury    chronic changes to left eye after injury   Hand weakness 03/02/2018   Hearing loss 04/21/2013   History of TIA (transient ischemic attack)    03/  2017  no residual after brief episode loss peripheral vision   HOH (hard of hearing)    Hyperglycemia 05/17/2016   Hyperlipemia    Hyperlipidemia 07/18/2010   Qualifier: Diagnosis of  By: [name] MD, [place]     Hypertension    HYPERTENSION, BENIGN ESSENTIAL 07/18/2010   Qualifier: Diagnosis of  By: [name] MD, [place]     Hypokalemia 01/26/2017   Knee pain 03/13/2019   Left shoulder pain 09/04/2018   Pneumothorax 10/11/2019   RIGHT    Prostate cancer ([place]) UROLOGIST-  DR GRAPEY/  ONCOLOGIST-  DR MANNING   dx 2015--- Stage T1c, Gleason 3+4, PSA 4.03, vol 44cc   PVC's (premature ventricular contractions)    Rash and nonspecific skin eruption 10/14/2015   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   S/P CABG x 05 Dec 1998   Skin lesion 10/14/2015   TIA (transient ischemic attack) 10/14/2015   Ventral  hernia 10/26/2011   Wears glasses     Medications:  Medications Prior to Admission  Medication Sig Dispense Refill Last Dose   allopurinol (ZYLOPRIM) 300 MG tablet TAKE 1 TABLET DAILY (Patient taking differently: Take 300 mg by mouth daily.) 90 tablet 3 05/24/2021   aspirin EC 81 MG tablet Take 1 tablet (81 mg total) by mouth daily. 90 tablet 3 05/24/2021   atorvastatin (LIPITOR) 40 MG tablet TAKE 1 TABLET DAILY (Patient taking differently: Take 40 mg by mouth daily.) 90 tablet 3 05/24/2021   Cholecalciferol (VITAMIN D PO) Take 1 capsule by mouth daily.   05/24/2021   escitalopram (LEXAPRO) 5 MG tablet TAKE 1 TABLET DAILY (Patient taking differently: Take 5 mg by mouth daily.) 90 tablet 3 05/24/2021   esomeprazole (NEXIUM) 20 MG capsule Take 20 mg by mouth daily at 12 noon.   05/24/2021   hydrochlorothiazide (HYDRODIURIL) 25 MG tablet TAKE 1 TABLET DAILY (Patient taking differently: Take 25 mg by mouth daily.) 90 tablet 3 05/24/2021   Misc Natural Products (BLACK CHERRY CONCENTRATE PO) Take 1 tablet by mouth at bedtime.   05/24/2021   Multiple Vitamins-Minerals (OCUVITE PO) Take 1  capsule by mouth daily.   05/24/2021   Propylene Glycol (SYSTANE BALANCE) 0.6 % SOLN Place 1 drop into both eyes daily as needed (dry eyes).   05/24/2021   sodium chloride (OCEAN) 0.65 % SOLN nasal spray Place 1 spray into both nostrils as needed for congestion.   Past Week   tamsulosin (FLOMAX) 0.4 MG CAPS capsule TAKE 1 CAPSULE TWICE A DAY (Patient taking differently: Take 0.4 mg by mouth 2 (two) times daily.) 180 capsule 3 05/24/2021    Scheduled:   allopurinol  300 mg Oral Daily   aspirin EC  81 mg Oral Daily   atorvastatin  80 mg Oral Daily   escitalopram  5 mg Oral Daily   insulin aspart  0-15 Units Subcutaneous TID WC   losartan  25 mg Oral Daily   metoprolol tartrate  12.5 mg Oral BID   pantoprazole  40 mg Oral Daily   sodium chloride flush  3 mL Intravenous Q12H   tamsulosin  0.4 mg Oral BID   Infusions:   sodium chloride     sodium chloride 1 mL/kg/hr (05/26/21 0606)   heparin 1,450 Units/hr (05/26/21 0523)   PRN: sodium chloride, acetaminophen, nitroGLYCERIN, ondansetron (ZOFRAN) IV, sodium chloride flush  Assessment: 56 yom with a history of CAD s/p 3v CABG '00 (LIMA-LAD, SVG-RCA, free radial to OM), HTN, DM, HLD, TIA, anxiety and GERD. Patient is presenting with chest pain. Heparin per pharmacy consult placed for chest pain/ACS. Patient is on not on anticoagulation prior to arrival.  Heparin level now at goal, no further leaking noted per patient.  Goal of Therapy:  Heparin level 0.3-0.7 units/ml Monitor platelets by anticoagulation protocol: Yes   Plan:  Continue heparin at 1450 units/h - no bolus Follow up after cath  Erin Hearing PharmD., BCPS Clinical Pharmacist 05/26/2021 8:41 AM

## 2021-05-26 NOTE — Interval H&P Note (Signed)
History and Physical Interval Note:  05/26/2021 10:02 AM  Tyler Guerra.  has presented today for surgery, with the diagnosis of NSTEMI.  The various methods of treatment have been discussed with the patient and family. After consideration of risks, benefits and other options for treatment, the patient has consented to  Procedure(s): LEFT HEART CATH AND CORS/GRAFTS ANGIOGRAPHY (N/A) as a surgical intervention.  The patient's history has been reviewed, patient examined, no change in status, stable for surgery.  I have reviewed the patient's chart and labs.  Questions were answered to the patient's satisfaction.    Cath Lab Visit (complete for each Cath Lab visit)  Clinical Evaluation Leading to the Procedure:   ACS: Yes.    Non-ACS:    Anginal Classification: CCS III  Anti-ischemic medical therapy: Maximal Therapy (2 or more classes of medications)  Non-Invasive Test Results: No non-invasive testing performed  Prior CABG: Previous CABG        Early [place]

## 2021-05-27 ENCOUNTER — Telehealth (HOSPITAL_COMMUNITY): Payer: Self-pay | Admitting: Pharmacist

## 2021-05-27 ENCOUNTER — Other Ambulatory Visit (HOSPITAL_COMMUNITY): Payer: Self-pay

## 2021-05-27 DIAGNOSIS — I10 Essential (primary) hypertension: Secondary | ICD-10-CM | POA: Diagnosis not present

## 2021-05-27 DIAGNOSIS — I214 Non-ST elevation (NSTEMI) myocardial infarction: Secondary | ICD-10-CM | POA: Diagnosis not present

## 2021-05-27 LAB — BASIC METABOLIC PANEL
Anion gap: 6 (ref 5–15)
BUN: 17 mg/dL (ref 8–23)
CO2: 27 mmol/L (ref 22–32)
Calcium: 9.1 mg/dL (ref 8.9–10.3)
Chloride: 107 mmol/L (ref 98–111)
Creatinine, Ser: 1.15 mg/dL (ref 0.61–1.24)
GFR, Estimated: >60 mL/min (ref >60)
Glucose, Bld: 126 mg/dL — ABNORMAL HIGH (ref 70–99)
Potassium: 4.6 mmol/L (ref 3.5–5.1)
Sodium: 140 mmol/L (ref 135–145)

## 2021-05-27 LAB — CBC
HCT: 38.3 % — ABNORMAL LOW (ref 39.0–52.0)
Hemoglobin: 12.6 g/dL — ABNORMAL LOW (ref 13.0–17.0)
MCH: 30.1 pg (ref 26.0–34.0)
MCHC: 32.9 g/dL (ref 30.0–36.0)
MCV: 91.6 fL (ref 80.0–100.0)
Platelets: 161 10*3/uL (ref 150–400)
RBC: 4.18 MIL/uL — ABNORMAL LOW (ref 4.22–5.81)
RDW: 13 % (ref 11.5–15.5)
WBC: 7.6 10*3/uL (ref 4.0–10.5)
nRBC: 0 % (ref 0.0–0.2)

## 2021-05-27 LAB — GLUCOSE, CAPILLARY: Glucose-Capillary: 118 mg/dL — ABNORMAL HIGH (ref 70–99)

## 2021-05-27 MED ORDER — METOPROLOL TARTRATE 25 MG PO TABS: 12.5000 mg | ORAL_TABLET | Freq: Two times a day (BID) | ORAL | 2 refills | Status: DC

## 2021-05-27 MED ORDER — PANTOPRAZOLE SODIUM 40 MG PO TBEC: 40.0000 mg | DELAYED_RELEASE_TABLET | Freq: Every day | ORAL | 2 refills | Status: DC

## 2021-05-27 MED ORDER — NITROGLYCERIN 0.4 MG SL SUBL: 0.4000 mg | SUBLINGUAL_TABLET | SUBLINGUAL | 2 refills | Status: AC | PRN

## 2021-05-27 MED ORDER — LOSARTAN POTASSIUM 25 MG PO TABS: 25.0000 mg | ORAL_TABLET | Freq: Every day | ORAL | 2 refills | Status: DC

## 2021-05-27 MED ORDER — ATORVASTATIN CALCIUM 80 MG PO TABS: 80.0000 mg | ORAL_TABLET | Freq: Every day | ORAL | 5 refills | Status: DC

## 2021-05-27 MED ORDER — CLOPIDOGREL BISULFATE 75 MG PO TABS: 75.0000 mg | ORAL_TABLET | Freq: Every day | ORAL | 11 refills | Status: DC

## 2021-05-27 NOTE — Progress Notes (Signed)
Progress Note  Patient Name: Tyler Guerra. Date of Encounter: 05/27/2021  [facility] Cardiologist: Sinclair Grooms, MD   Subjective   No chest pain this am.No issues overnight  Inpatient Medications    Scheduled Meds:  allopurinol  300 mg Oral Daily   aspirin EC  81 mg Oral Daily   atorvastatin  80 mg Oral Daily   clopidogrel  75 mg Oral Q breakfast   escitalopram  5 mg Oral Daily   insulin aspart  0-15 Units Subcutaneous TID WC   losartan  25 mg Oral Daily   metoprolol tartrate  12.5 mg Oral BID   pantoprazole  40 mg Oral Daily   sodium chloride flush  3 mL Intravenous Q12H   tamsulosin  0.4 mg Oral BID   Continuous Infusions:   PRN Meds: acetaminophen, nitroGLYCERIN, ondansetron (ZOFRAN) IV   Vital Signs    Vitals:   05/26/21 1254 05/26/21 1309 05/26/21 2030 05/27/21 0546  BP: (!) 121/93 140/85 124/76 132/81  Pulse: (!) 58 (!) 57 78 68  Resp:   18 19  Temp:   (!) 100.5 F (38.1 C) 98.8 F (37.1 C)  TempSrc:   Oral Oral  SpO2: 97% 96%  99%  Weight:    96.3 kg  Height:        Intake/Output Summary (Last 24 hours) at 05/27/2021 0841 Last data filed at 05/26/2021 1333 Gross per 24 hour  Intake 815.36 ml  Output --  Net 815.36 ml   Last 3 Weights 05/27/2021 05/25/2021 05/24/2021  Weight (lbs) 212 lb 4.8 oz 212 lb 12.8 oz 213 lb  Weight (kg) 96.299 kg 96.525 kg 96.616 kg      Telemetry    Sinus -Personally Reviewed  ECG     NSR- Personally Reviewed  Physical Exam   General: Well developed, well nourished, NAD  HEENT: OP clear, mucus membranes moist  SKIN: warm, dry. No rashes. Neuro: No focal deficits  Musculoskeletal: Muscle strength 5/5 all ext  Psychiatric: Mood and affect normal  Neck: No JVD, no carotid bruits, no thyromegaly, no lymphadenopathy.  Lungs:Clear bilaterally, no wheezes, rhonci, crackles Cardiovascular: Regular rate and rhythm. No murmurs, gallops or rubs. Abdomen:Soft. Bowel sounds present. Non-tender.   Extremities: No lower extremity edema. Pulses are 2 + in the bilateral DP/PT.   Labs    High Sensitivity Troponin:   Recent Labs  Lab 05/24/21 1814 05/24/21 [facility]*     Chemistry Recent Labs  Lab 05/24/21 1814 05/25/21 0251 05/26/21 0127 05/27/21 0316  NA 140 137 136 140  K 3.5 3.3* 3.6 4.6  CL 106 102 104 107  CO2 26 25 25 27   GLUCOSE 102* 106* 135* 126*  BUN 18 17 16 17   CREATININE 1.06 0.97 0.98 1.15  CALCIUM 9.5 9.3 8.8* 9.1  PROT 6.5  --   --   --   ALBUMIN 3.6  --   --   --   AST 30  --   --   --   ALT 23  --   --   --   ALKPHOS 95  --   --   --   BILITOT 1.0  --   --   --   GFRNONAA >60 >60 >60 >60  ANIONGAP 8 10 7 6     Lipids  Recent Labs  Lab 05/25/21 0251  CHOL 154  TRIG 98  HDL 49  LDLCALC 85  CHOLHDL 3.1  Hematology Recent Labs  Lab 05/25/21 1218 05/26/21 0127 05/27/21 0316  WBC 6.8 7.9 7.6  RBC 4.43 4.34 4.18*  HGB 13.2 13.1 12.6*  HCT 40.4 39.1 38.3*  MCV 91.2 90.1 91.6  MCH 29.8 30.2 30.1  MCHC 32.7 33.5 32.9  RDW 12.8 12.9 13.0  PLT 173 181 161   Thyroid No results for input(s): TSH, FREET4 in the last 168 hours.  BNPNo results for input(s): BNP, PROBNP in the last 168 hours.  DDimer No results for input(s): DDIMER in the last 168 hours.   Radiology    CARDIAC CATHETERIZATION  Result Date: 05/26/2021   Prox RCA lesion is 100% stenosed.   Origin to Prox Graft lesion is 100% stenosed.   1st Diag lesion is 80% stenosed.   Ost Cx lesion is 100% stenosed.   Post intervention, there is a 0% residual stenosis.   LV end diastolic pressure is severely elevated. 1.  Patent LIMA to LAD, vein graft to PDA, and radial to obtuse marginal with occluded vein graft to diagonal.  There is also severe native vessel disease with high-grade disease of the unrevascularized first diagonal treated with 1 drug-eluting stent. 2.  Elevated LVEDP with preserved ejection fraction. Recommendations: Dual antiplatelet therapy for 1 year  and aggressive treatment of cardiovascular risk factors.   ECHOCARDIOGRAM COMPLETE  Result Date: 05/25/2021    ECHOCARDIOGRAM REPORT   Patient Name:   Tyler Guerra. Date of Exam: 05/25/2021 Medical Rec #:  [id number]          Height:       70.0 in Accession #:    [id number]         Weight:       213.0 lb Date of Birth:  17-Jul-1951          BSA:          2.144 m Patient Age:    70 years           BP:           118/80 mmHg Patient Gender: M                  HR:           63 bpm. Exam Location:  Inpatient Procedure: 2D Echo, Cardiac Doppler and Color Doppler Indications:    Acute MI  History:        Patient has prior history of Echocardiogram examinations, most                 recent 01/28/2017. CAD and Previous Myocardial Infarction, Prior                 CABG, TIA, Signs/Symptoms:Chest Pain; Risk Factors:Diabetes,                 Hypertension and Dyslipidemia.  Sonographer:    Dustin Flock RDCS Referring Phys: [id number] [place]  1. Left ventricular ejection fraction, by estimation, is 60 to 65%. The left ventricle has normal function. The left ventricle has no regional wall motion abnormalities. Left ventricular diastolic parameters are indeterminate.  2. Right ventricular systolic function is normal. The right ventricular size is normal. There is normal pulmonary artery systolic pressure.  3. Right atrial size was mildly dilated.  4. The mitral valve is normal in structure. Trivial mitral valve regurgitation. No evidence of mitral stenosis.  5. The aortic valve is grossly normal. There is mild calcification of the aortic valve. Aortic valve regurgitation  is not visualized. Mild aortic valve sclerosis is present, with no evidence of aortic valve stenosis.  6. The inferior vena cava is dilated in size with <50% respiratory variability, suggesting right atrial pressure of 15 mmHg. Comparison(s): No significant change from prior study. Conclusion(s)/Recommendation(s): Otherwise normal  echocardiogram, with minor abnormalities described in the report. FINDINGS  Left Ventricle: Left ventricular ejection fraction, by estimation, is 60 to 65%. The left ventricle has normal function. The left ventricle has no regional wall motion abnormalities. The left ventricular internal cavity size was normal in size. There is  borderline left ventricular hypertrophy. Left ventricular diastolic parameters are indeterminate. Right Ventricle: The right ventricular size is normal. Right vetricular wall thickness was not well visualized. Right ventricular systolic function is normal. There is normal pulmonary artery systolic pressure. The tricuspid regurgitant velocity is 2.18 m/s, and with an assumed right atrial pressure of 15 mmHg, the estimated right ventricular systolic pressure is 46.5 mmHg. Left Atrium: Left atrial size was normal in size. Right Atrium: Right atrial size was mildly dilated. Pericardium: There is no evidence of pericardial effusion. Mitral Valve: The mitral valve is normal in structure. Trivial mitral valve regurgitation. No evidence of mitral valve stenosis. Tricuspid Valve: The tricuspid valve is normal in structure. Tricuspid valve regurgitation is trivial. No evidence of tricuspid stenosis. Aortic Valve: The aortic valve is grossly normal. There is mild calcification of the aortic valve. Aortic valve regurgitation is not visualized. Mild aortic valve sclerosis is present, with no evidence of aortic valve stenosis. Aortic valve peak gradient  measures 13.7 mmHg. Pulmonic Valve: The pulmonic valve was not well visualized. Pulmonic valve regurgitation is not visualized. Aorta: The aortic root, ascending aorta and aortic arch are all structurally normal, with no evidence of dilitation or obstruction. Venous: The inferior vena cava is dilated in size with less than 50% respiratory variability, suggesting right atrial pressure of 15 mmHg. IAS/Shunts: The atrial septum is grossly normal.  LEFT  VENTRICLE PLAX 2D LVIDd:         5.20 cm   Diastology LVIDs:         3.30 cm   LV e' medial:    6.64 cm/s LV PW:         1.10 cm   LV E/e' medial:  12.9 LV IVS:        1.10 cm   LV e' lateral:   7.72 cm/s LVOT diam:     2.40 cm   LV E/e' lateral: 11.1 LV SV:         102 LV SV Index:   47 LVOT Area:     4.52 cm  RIGHT VENTRICLE RV Basal diam:  3.00 cm RV S prime:     10.70 cm/s TAPSE (M-mode): 1.6 cm LEFT ATRIUM             Index        RIGHT ATRIUM           Index LA diam:        3.80 cm 1.77 cm/m   RA Area:     15.60 cm LA Vol (A2C):   37.2 ml 17.35 ml/m  RA Volume:   43.60 ml  20.34 ml/m LA Vol (A4C):   45.9 ml 21.41 ml/m LA Biplane Vol: 42.6 ml 19.87 ml/m  AORTIC VALVE AV Area (Vmax): 2.57 cm AV Vmax:        185.00 cm/s AV Peak Grad:   13.7 mmHg LVOT Vmax:  105.00 cm/s LVOT Vmean:     72.400 cm/s LVOT VTI:       0.225 m  AORTA Ao Root diam: 3.20 cm MITRAL VALVE               TRICUSPID VALVE MV Area (PHT): 2.80 cm    TR Peak grad:   19.0 mmHg MV Decel Time: 271 msec    TR Vmax:        218.00 cm/s MV E velocity: 85.40 cm/s MV A velocity: 86.80 cm/s  SHUNTS MV E/A ratio:  0.98        Systemic VTI:  0.22 m                            Systemic Diam: 2.40 cm Buford Dresser MD Electronically signed by Buford Dresser MD Signature Date/Time: 05/25/2021/4:59:29 PM    Final     Cardiac Studies   Echocardiogram 05/25/2021: Impressions:  1. Left ventricular ejection fraction, by estimation, is 60 to 65%. The  left ventricle has normal function. The left ventricle has no regional  wall motion abnormalities. Left ventricular diastolic parameters are  indeterminate.   2. Right ventricular systolic function is normal. The right ventricular  size is normal. There is normal pulmonary artery systolic pressure.   3. Right atrial size was mildly dilated.   4. The mitral valve is normal in structure. Trivial mitral valve  regurgitation. No evidence of mitral stenosis.   5. The aortic valve is  grossly normal. There is mild calcification of the  aortic valve. Aortic valve regurgitation is not visualized. Mild aortic  valve sclerosis is present, with no evidence of aortic valve stenosis.   6. The inferior vena cava is dilated in size with <50% respiratory  variability, suggesting right atrial pressure of 15 mmHg.   Comparison(s): No significant change from prior study.   Conclusion(s)/Recommendation(s): Otherwise normal echocardiogram, with  minor abnormalities described in the report.   Patient Profile     70 y.o. male with a history of CAD s/p CABG x3 (LIMA to LAD, SVG to RCA, and free radial to OM),  TIA, hypertension, hyperlipidemia, type 2 diabetes mellitus, GERD, and anxiety who presented with chest pain and found to have NSTEMI.  Assessment & Plan    NSTEMI/CAD s/p 3V CABG: Pt admitted with NSTEMI. High-sensitivity troponin elevated at 887 >> 942. - Echo shows LVEF of 60-65% with normal wall motion. Cardiac cath with patent grafts to the LAD, OM and PDA. Severe stenosis in the diagonal branch that was not protected by they bypass grafts. This was treated with a drug eluting stent.  -He is doing well today. Will continue DAPT for one year with ASA/Plavix.  -Continue statin and beta blocker.   Hypertension: BP controlled. No changes  Hyperlipidemia: Continue statin  Type 2 Diabetes Mellitus: Hemoglobin 6.7 in 04/2021. Will review options for oral therapy  Will d/c home today. Follow up with Dr. Tamala Julian   For questions or updates, please contact [place] Please consult [email or web address] for contact info under

## 2021-05-27 NOTE — Progress Notes (Signed)
CARDIAC REHAB PHASE I   PRE:  Rate/Rhythm: 11 SR  BP:  Sitting: 137/86      SaO2: 95 RA  MODE:  Ambulation: 300 ft   POST:  Rate/Rhythm: 85 SR  BP:  Sitting: 136/81    SaO2: 96 RA   Pt ambulated 356ft in hallway independently with steady gait. Pt denies CP, SOB, or dizziness. Pt educated on importance of ASA, Plavix, statin, and NTG. Pt given MI book along with heart healthy and diabetic diets. Reviewed site care, restrictions, and exercise guidelines. Will refer to CRP II [place].  [id number] Rufina Falco, RN BSN 05/27/2021 9:12 AM

## 2021-05-27 NOTE — Telephone Encounter (Signed)
Hello,  The Pharmacy team is conducting a discharge transitions of care quality improvement initiative. The recommendations below are for your consideration.    Tyler Guerra. is a 70 y.o. male (MRN: [id number], DOB: 30-May-1951) who was recently hospitalized on 05/24/2021 for chest pain. They are anticipated to visit your clinic for post-discharge follow-up and may benefit from assistance with medication initiation and/or access.    Dx: ACS    Relevant medication access issues which may benefit from further intervention include:n/a   Please consider the following therapy recommendations at follow-up appointment below:   CAD - new stent 10/24 -Added clopidogrel to aspirin regimen, plan for 12 months -Nexium change to pantoprazole due to drug interaction with clopidogrel -Started metoprolol 12.5 bid, follow up ability to titrate and transition to succinate as able 2. Hypertension-started on losartan 25 mg daily -Follow up ability to titrate as outpatient 3. Hyperlipidemia -Increased atorvastatin to 80 mg   Other relevant medication issues from their recent admission include: Diabetes - a1c 6.7 -Discussed oral hypoglycemic agents with patient. He prefers at this time to follow up with his primary.  -[place] is nonformulary, Jardiance copay would be $47.   We appreciate your assistance with the implementation of these recommendations. Please let us know if there is anything we can help you with at this time.       Thanks!   Erin Hearing PharmD., BCPS Clinical Pharmacist 05/27/2021 3:08 PM

## 2021-05-27 NOTE — Care Management Important Message (Signed)
Important Message  Patient Details  Name: Tyler Guerra. MRN: [id number] Date of Birth: 1950/09/24   Medicare Important Message Given:  Yes     Radonna Bracher 05/27/2021, 4:11 PM

## 2021-05-27 NOTE — Discharge Instructions (Addendum)
Medication Changes: - START Plavix 75mg  daily in addition to Aspirin 81mg  daily which you are already taking. These medications are very important in keeping the new stent in your heart open. - START Losartan 25mg  daily. - START Metoprolol tartrate (Lopressor) 12.5mg  twice daily. - INCREASE Atorvastatin (Lipitor) to 80mg  daily. - STOP HCTZ. - STOP Nexium and START Protonix 40mg  daily instead.  Post NSTEMI NO HEAVY LIFTING X 2 WEEKS. NO SEXUAL ACTIVITY X 2 WEEKS. NO DRIVING X 1 WEEK. NO SOAKING BATHS, HOT TUBS, POOLS, ETC., X 7 DAYS.  Groin Site Care: Refer to this sheet in the next few weeks. These instructions provide you with information on caring for yourself after your procedure. Your caregiver may also give you more specific instructions. Your treatment has been planned according to current medical practices, but problems sometimes occur. Call your caregiver if you have any problems or questions after your procedure. HOME CARE INSTRUCTIONS You may shower 24 hours after the procedure. Remove the bandage (dressing) and gently wash the site with plain soap and water. Gently pat the site dry.  Do not apply powder or lotion to the site.  Do not sit in a bathtub, swimming pool, or whirlpool for 5 to 7 days.  No bending, squatting, or lifting anything over 10 pounds (4.5 kg) as directed by your caregiver.  Inspect the site at least twice daily.  Do not drive home if you are discharged the same day of the procedure. Have someone else drive you.  What to expect: Any bruising will usually fade within 1 to 2 weeks.  Blood that collects in the tissue (hematoma) may be painful to the touch. It should usually decrease in size and tenderness within 1 to 2 weeks.  SEEK IMMEDIATE MEDICAL CARE IF: You have unusual pain at the groin site or down the affected leg.  You have redness, warmth, swelling, or pain at the groin site.  You have drainage (other than a small amount of blood on the dressing).   You have chills.  You have a fever or persistent symptoms for more than 72 hours.  You have a fever and your symptoms suddenly get worse.  Your leg becomes pale, cool, tingly, or numb.  You have heavy bleeding from the site. Hold pressure on the site.

## 2021-05-27 NOTE — TOC Benefit Eligibility Note (Signed)
Patient Teacher, English as a foreign language completed.    The patient is currently admitted and upon discharge could be taking Jardiance 10 mg.  The current 30 day co-pay is, $47.00.   The patient is currently admitted and upon discharge could be taking Farxiga 10 mg.  Non Formulary   The patient is insured through [place], [place] Patient Advocate Specialist [place] Team Direct Number: [phone]  Fax: [phone]

## 2021-06-02 ENCOUNTER — Telehealth: Payer: Self-pay

## 2021-06-02 NOTE — Telephone Encounter (Signed)
Transition Care Management Unsuccessful Follow-up Telephone Call  Date of discharge and from where:  05/27/2021  Tyler Guerra  Attempts:  1st Attempt  Reason for unsuccessful TCM follow-up call:  No answer/busy Tomasa Rand, RN, BSN, CEN [place] Coordinator [phone]

## 2021-06-03 ENCOUNTER — Telehealth: Payer: Self-pay

## 2021-06-03 NOTE — Telephone Encounter (Signed)
Transition Care Management Unsuccessful Follow-up Telephone Call  Date of discharge and from where:  05/27/21 from [facility]  Attempts:  2nd Attempt  Reason for unsuccessful TCM follow-up call:  Left voice message   Thea Silversmith, RN, MSN, BSN, [facility] Coordinator [phone]

## 2021-06-04 NOTE — Progress Notes (Signed)
Cardiology Office Note    Date:  06/10/2021   ID:  Tyler Fess., DOB 04-Mar-1951, MRN [id number]   PCP:  Tonia Ghent, MD   [place]  Cardiologist:  Sinclair Grooms, MD   Advanced Practice Provider:  No care team member to display Electrophysiologist:  None   [id number]   Chief Complaint  Patient presents with   Hospitalization Follow-up     History of Present Illness:  Tyler Peerson. is a 70 y.o. male with a history of CAD s/p CABG x3 (LIMA to LAD, SVG to RCA, and free radial to OM),  TIA, hypertension, hyperlipidemia, type 2 diabetes mellitus, GERD, and anxiety who was admitted on 05/24/2021 with NSTEMI after presenting with chest pain.     Patient admitted with NSTEMI HS troponin 942 echo normal LVEF 60-65% normal wall motion,  Cardiac catheterization on 05/26/2021 showed occluded SVG to Diag with patent LIMA to LAD and SVG to PDA as well as severe native vessel disease with severe stenosis of 1st Diag. Patient underwent successful PCI with DES to 1st Diag lesion. LVEDP was noted to be severely elevated but he did not appear volume overloaded on exam. Patient was started on DAPT with Aspirin and Plavix as well as Lopressor. Home Lipitor was also increased. Will stop Nexium and start Protonix  now that he is on Plavix.  A1c was 6.7.  He was not on any medication but wanted to discuss with PCP.  Patient comes in for f/u. Feels so much better since his stent. Riding his ebike 3 miles and using pedal assist to go uphills. Was riding 6-7 miles and wants to get back.   Past Medical History:  Diagnosis Date   Anxiety state 03/16/2016   Aphasia 01/26/2017   Benign localized prostatic hyperplasia with lower urinary tract symptoms (LUTS)    BPV (benign positional vertigo) 11/02/2014   Carotid artery stenosis    PER DUPLEX 10-22-2015  BILATERAL ICA 1-39%   Coronary artery disease    CARDIOLOGIST-  DR Daneen Schick--  VISIT EVERY OTHER YEAR-- LOV  2015   Coronary atherosclerosis 07/19/2010   Qualifier: Diagnosis of  By: [name] MD, [place]     Depression 01/26/2017   Diabetes mellitus without complication ([place]) 1/95/0932   GERD 07/19/2010   Qualifier: Diagnosis of  By: [name] MD, Phillip Heal     GERD (gastroesophageal reflux disease)    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   Gout    Gout 07/18/2010   Qualifier: Diagnosis of  By: [name] MD, Phillip Heal     H/O eye injury    chronic changes to left eye after injury   Hand weakness 03/02/2018   Hearing loss 04/21/2013   History of TIA (transient ischemic attack)    03/ 2017  no residual after brief episode loss peripheral vision   HOH (hard of hearing)    Hyperglycemia 05/17/2016   Hyperlipemia    Hyperlipidemia 07/18/2010   Qualifier: Diagnosis of  By: [name] MD, Graham     Hypertension    HYPERTENSION, BENIGN ESSENTIAL 07/18/2010   Qualifier: Diagnosis of  By: [name] MD, Graham     Hypokalemia 01/26/2017   Knee pain 03/13/2019   Left shoulder pain 09/04/2018   Pneumothorax 10/11/2019   RIGHT    Prostate cancer ([place]) UROLOGIST-  DR GRAPEY/  ONCOLOGIST-  DR MANNING   dx 2015--- Stage T1c, Gleason 3+4, PSA 4.03, vol 44cc   PVC's (premature ventricular contractions)  Rash and nonspecific skin eruption 10/14/2015   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   S/P CABG x 05 Dec 1998   Skin lesion 10/14/2015   TIA (transient ischemic attack) 10/14/2015   Ventral hernia 10/26/2011   Wears glasses     Past Surgical History:  Procedure Laterality Date   [place]  12-22-2001   dr Daneen Schick   severe native vessel disease mLAD 60-70%,  total occulsion pCFX  and pRCA/  widely patent saphenous vein , free radial , and LIMA grafts/  minminal lv dysfunction, ef 60%   CHEST TUBE INSERTION Right 10/11/2019   CORONARY ARTERY BYPASS GRAFT  May 2000   LIMA to LAD,  SVG to PDA and Diagonal, Free radial graft to OM   CORONARY STENT INTERVENTION N/A 05/26/2021    Procedure: CORONARY STENT INTERVENTION;  Surgeon: Early Osmond, MD;  Location: [place];  Service: Cardiovascular;  Laterality: N/A;   Exericse treadmill test  last one 01-03-2014  dr Daneen Schick   normal exercise tolerance w/ hypertensive repsonse,  no ischemic EKG changes, appropriate HR response & recovery ([facility] TM score 9;  Low Risk , PVC's w/ exertion)   LEFT HEART CATH AND CORS/GRAFTS ANGIOGRAPHY N/A 05/26/2021   Procedure: LEFT HEART CATH AND CORS/GRAFTS ANGIOGRAPHY;  Surgeon: Early Osmond, MD;  Location: [place] [facility];  Service: Cardiovascular;  Laterality: N/A;   PROSTATE BIOPSY     RADIOACTIVE SEED IMPLANT N/A 01/13/2016   Procedure: RADIOACTIVE SEED IMPLANT/BRACHYTHERAPY IMPLANT;  Surgeon: Rana Snare, MD;  Location: [facility];  Service: Urology;  Laterality: N/A;    Current Medications: Current Meds  Medication Sig   allopurinol (ZYLOPRIM) 300 MG tablet TAKE 1 TABLET DAILY (Patient taking differently: Take 300 mg by mouth daily.)   aspirin EC 81 MG tablet Take 1 tablet (81 mg total) by mouth daily.   atorvastatin (LIPITOR) 80 MG tablet Take 1 tablet (80 mg total) by mouth daily.   Cholecalciferol (VITAMIN D PO) Take 1 capsule by mouth daily.   clopidogrel (PLAVIX) 75 MG tablet Take 1 tablet (75 mg total) by mouth daily with breakfast.   escitalopram (LEXAPRO) 5 MG tablet TAKE 1 TABLET DAILY (Patient taking differently: Take 5 mg by mouth daily.)   losartan (COZAAR) 25 MG tablet Take 1 tablet (25 mg total) by mouth daily.   metoprolol tartrate (LOPRESSOR) 25 MG tablet Take 0.5 tablets (12.5 mg total) by mouth 2 (two) times daily.   Misc Natural Products (BLACK CHERRY CONCENTRATE PO) Take 1 tablet by mouth at bedtime.   Multiple Vitamins-Minerals (OCUVITE PO) Take 1 capsule by mouth daily.   nitroGLYCERIN (NITROSTAT) 0.4 MG SL tablet Place 1 tablet (0.4 mg total) under the tongue every 5 (five) minutes as needed for chest pain.   pantoprazole  (PROTONIX) 40 MG tablet Take 1 tablet (40 mg total) by mouth daily.   Propylene Glycol (SYSTANE BALANCE) 0.6 % SOLN Place 1 drop into both eyes daily as needed (dry eyes).   sodium chloride (OCEAN) 0.65 % SOLN nasal spray Place 1 spray into both nostrils as needed for congestion.   tamsulosin (FLOMAX) 0.4 MG CAPS capsule TAKE 1 CAPSULE TWICE A DAY (Patient taking differently: Take 0.4 mg by mouth 2 (two) times daily.)     Allergies:   Patient has no known allergies.   Social History   Socioeconomic History   Marital status: Married  Spouse name: Not on file   Number of children: 2   Years of education: Not on file   Highest education level: Not on file  Occupational History   Occupation: Press photographer REP    Employer: [employer] [place]    Comment: [employer], travelling 3-4 nights a week  Tobacco Use   Smoking status: Never   Smokeless tobacco: Never  Vaping Use   Vaping Use: Never used  Substance and Sexual Activity   Alcohol use: No   Drug use: No   Sexual activity: Yes  Other Topics Concern   Not on file  Social History Narrative   Married 1974   2 daughters and 2 grandkids   Retired 2018.   Social Determinants of Health   Financial Resource Strain: Not on file  Food Insecurity: Not on file  Transportation Needs: Not on file  Physical Activity: Not on file  Stress: Not on file  Social Connections: Not on file     Family History:  The patient's  family history includes Cancer in his father, paternal uncle, and sister; Dementia in his mother; Heart disease in his father; Prostate cancer in his father; Stroke in his father.   ROS:   Please see the history of present illness.    ROS All other systems reviewed and are negative.   PHYSICAL EXAM:   VS:  BP 130/80   Pulse 82   Ht 5\' 10"  (1.778 m)   Wt 221 lb 9.6 oz (100.5 kg)   SpO2 96%   BMI 31.80 kg/m   Physical Exam  GEN: Obese, in no acute distress  Neck: no JVD, carotid bruits, or masses Cardiac:RRR;  no murmurs, rubs, or gallops  Respiratory:  clear to auscultation bilaterally, normal work of breathing GI: soft, nontender, nondistended, + BS Ext: without cyanosis, clubbing, or edema, Good distal pulses bilaterally Neuro:  Alert and Oriented x 3 Psych: euthymic mood, full affect  Wt Readings from Last 3 Encounters:  06/10/21 221 lb 9.6 oz (100.5 kg)  05/27/21 212 lb 4.8 oz (96.3 kg)  04/08/21 215 lb (97.5 kg)      Studies/Labs Reviewed:   EKG:  EKG is not ordered today.     Recent Labs: 05/24/2021: ALT 23 05/27/2021: BUN 17; Creatinine, Ser 1.15; Hemoglobin 12.6; Platelets 161; Potassium 4.6; Sodium 140   Lipid Panel    Component Value Date/Time   CHOL 154 05/25/2021 0251   TRIG 98 05/25/2021 0251   HDL 49 05/25/2021 0251   CHOLHDL 3.1 05/25/2021 0251   VLDL 20 05/25/2021 0251   LDLCALC 85 05/25/2021 0251   LDLDIRECT 97.0 10/01/2020 0916    Additional studies/ records that were reviewed today include:  Echocardiogram 05/25/2021: Impressions: 1. Left ventricular ejection fraction, by estimation, is 60 to 65%. The  left ventricle has normal function. The left ventricle has no regional  wall motion abnormalities. Left ventricular diastolic parameters are  indeterminate.   2. Right ventricular systolic function is normal. The right ventricular  size is normal. There is normal pulmonary artery systolic pressure.   3. Right atrial size was mildly dilated.   4. The mitral valve is normal in structure. Trivial mitral valve  regurgitation. No evidence of mitral stenosis.   5. The aortic valve is grossly normal. There is mild calcification of the  aortic valve. Aortic valve regurgitation is not visualized. Mild aortic  valve sclerosis is present, with no evidence of aortic valve stenosis.   6. The inferior vena cava is dilated  in size with <50% respiratory  variability, suggesting right atrial pressure of 15 mmHg.   Comparison(s): No significant change from prior study.    Conclusion(s)/Recommendation(s): Otherwise normal echocardiogram, with minor abnormalities described in the report.  _____________   Left Cardiac Catheterization 05/16/2021:   Prox RCA lesion is 100% stenosed.   Origin to Prox Graft lesion is 100% stenosed.   1st Diag lesion is 80% stenosed.   Ost Cx lesion is 100% stenosed.   Post intervention, there is a 0% residual stenosis.   LV end diastolic pressure is severely elevated.   1.  Patent LIMA to LAD, vein graft to PDA, and radial to obtuse marginal with occluded vein graft to diagonal.  There is also severe native vessel disease with high-grade disease of the unrevascularized first diagonal treated with 1 drug-eluting stent. 2.  Elevated LVEDP with preserved ejection fraction.   Recommendations: Dual antiplatelet therapy for 1 year and aggressive treatment of cardiovascular risk factors.   Diagnostic Dominance: Right Intervention       Risk Assessment/Calculations:         ASSESSMENT:    1. Coronary artery disease involving coronary bypass graft of native heart without angina pectoris   2. Essential hypertension   3. Hyperlipidemia, unspecified hyperlipidemia type   4. Diabetes mellitus without complication ([place])      PLAN:  In order of problems listed above:  CAD status post CABG x3 in 2000, NSTEMI 05/26/2021 treated with DES to diagonal 1, patent LIMA to the LAD SVG to the PDA and radial OM with occluded vein graft to the diagonal.  Started on DAPT with aspirin and Plavix as well as metoprolol.  Lipitor was increased.  Hypertension-controlled  Hyperlipidemia Lipitor increased in the hospital.  Plan for fasting lipid panel in 3 months  DM2 A1c was 6.7 patient will f/u with PCP  Shared Decision Making/Informed Consent        Medication Adjustments/Labs and Tests Ordered: Current medicines are reviewed at length with the patient today.  Concerns regarding medicines are outlined above.  Medication changes,  Labs and Tests ordered today are listed in the Patient Instructions below. Patient Instructions  Medication Instructions:  Your physician recommends that you continue on your current medications as directed. Please refer to the Current Medication list given to you today.  *If you need a refill on your cardiac medications before your next appointment, please call your pharmacy*   Lab Work: Fasting lipid in 3 months If you have labs (blood work) drawn today and your tests are completely normal, you will receive your results only by: [place] (if you have MyChart) OR A paper copy in the mail If you have any lab test that is abnormal or we need to change your treatment, we will call you to review the results.   Follow-Up: At [facility], you and your health needs are our priority.  As part of our continuing mission to provide you with exceptional heart care, we have created designated Provider Care Teams.  These Care Teams include your primary Cardiologist (physician) and Advanced Practice Providers (APPs -  Physician Assistants and Nurse Practitioners) who all work together to provide you with the care you need, when you need it.   Your next appointment:   10/01/2021  The format for your next appointment:   In Person  Provider:   Sinclair Grooms, MD {   Signed, Ermalinda Barrios, PA-C  06/10/2021 2:32 PM    [place]  [address], [place], [place]  [address] Phone: [phone]; Fax: [phone]

## 2021-06-06 ENCOUNTER — Telehealth (HOSPITAL_COMMUNITY): Payer: Self-pay

## 2021-06-06 NOTE — Telephone Encounter (Signed)
Attempted to call patient in regards to Cardiac Rehab - LM on VM 

## 2021-06-10 ENCOUNTER — Ambulatory Visit (INDEPENDENT_AMBULATORY_CARE_PROVIDER_SITE_OTHER): Payer: Medicare Other | Admitting: Physician Assistant

## 2021-06-10 ENCOUNTER — Encounter: Payer: Self-pay | Admitting: Physician Assistant

## 2021-06-10 ENCOUNTER — Other Ambulatory Visit: Payer: Self-pay

## 2021-06-10 VITALS — BP 130/80 | HR 82 | Ht 70.0 in | Wt 221.6 lb

## 2021-06-10 DIAGNOSIS — I10 Essential (primary) hypertension: Secondary | ICD-10-CM | POA: Diagnosis not present

## 2021-06-10 DIAGNOSIS — E119 Type 2 diabetes mellitus without complications: Secondary | ICD-10-CM

## 2021-06-10 DIAGNOSIS — I25810 Atherosclerosis of coronary artery bypass graft(s) without angina pectoris: Secondary | ICD-10-CM | POA: Diagnosis not present

## 2021-06-10 DIAGNOSIS — E785 Hyperlipidemia, unspecified: Secondary | ICD-10-CM | POA: Diagnosis not present

## 2021-06-10 NOTE — Patient Instructions (Signed)
Medication Instructions:  Your physician recommends that you continue on your current medications as directed. Please refer to the Current Medication list given to you today.  *If you need a refill on your cardiac medications before your next appointment, please call your pharmacy*   Lab Work: Fasting lipid in 3 months If you have labs (blood work) drawn today and your tests are completely normal, you will receive your results only by: [place] (if you have MyChart) OR A paper copy in the mail If you have any lab test that is abnormal or we need to change your treatment, we will call you to review the results.   Follow-Up: At [facility], you and your health needs are our priority.  As part of our continuing mission to provide you with exceptional heart care, we have created designated Provider Care Teams.  These Care Teams include your primary Cardiologist (physician) and Advanced Practice Providers (APPs -  Physician Assistants and Nurse Practitioners) who all work together to provide you with the care you need, when you need it.   Your next appointment:   10/01/2021  The format for your next appointment:   In Person  Provider:   Sinclair Grooms, MD {

## 2021-06-24 ENCOUNTER — Other Ambulatory Visit: Payer: Self-pay | Admitting: *Deleted

## 2021-06-24 MED ORDER — METOPROLOL TARTRATE 25 MG PO TABS: 12.5000 mg | ORAL_TABLET | Freq: Two times a day (BID) | ORAL | 3 refills | Status: DC

## 2021-06-30 ENCOUNTER — Encounter (HOSPITAL_COMMUNITY): Payer: Self-pay

## 2021-06-30 NOTE — Telephone Encounter (Signed)
Pt insurance is active and benefits verified through Medicare A/B. Co-pay $0.00, DED $233.00/$233.00 met, out of pocket $0.00/$0.00 met, co-insurance 20%. No pre-authorization required. Passport, 06/30/21 @ 2:35PM, RAF#[id number]   2ndary insurance is active and benefits verified through [employer]. Co-pay $0.00, DED $0.00/$0.00 met, out of pocket $0.00/$0.00 met, co-insurance 0%. No pre-authorization required. Passport, 06/30/21 @ 2:38PM, EXO#[id number]

## 2021-06-30 NOTE — Telephone Encounter (Signed)
Attempted to call patient in regards to Cardiac Rehab - LM on VM Mailed letter 

## 2021-07-15 ENCOUNTER — Telehealth (HOSPITAL_COMMUNITY): Payer: Self-pay

## 2021-07-15 NOTE — Telephone Encounter (Signed)
No response from pt in regards to cardiac rehab. Closed referral 

## 2021-08-07 ENCOUNTER — Other Ambulatory Visit: Payer: Self-pay

## 2021-08-07 MED ORDER — METOPROLOL TARTRATE 25 MG PO TABS: 12.5000 mg | ORAL_TABLET | Freq: Two times a day (BID) | ORAL | 3 refills | Status: DC

## 2021-08-21 ENCOUNTER — Other Ambulatory Visit: Payer: Self-pay | Admitting: Student

## 2021-08-25 ENCOUNTER — Other Ambulatory Visit: Payer: Self-pay

## 2021-08-25 MED ORDER — PANTOPRAZOLE SODIUM 40 MG PO TBEC: 40.0000 mg | DELAYED_RELEASE_TABLET | Freq: Every day | ORAL | 3 refills | Status: DC

## 2021-08-25 MED ORDER — ATORVASTATIN CALCIUM 80 MG PO TABS: 80.0000 mg | ORAL_TABLET | Freq: Every day | ORAL | 5 refills | Status: DC

## 2021-08-25 MED ORDER — CLOPIDOGREL BISULFATE 75 MG PO TABS: 75.0000 mg | ORAL_TABLET | Freq: Every day | ORAL | 11 refills | Status: DC

## 2021-09-26 ENCOUNTER — Other Ambulatory Visit: Payer: Self-pay | Admitting: Family Medicine

## 2021-09-30 NOTE — Progress Notes (Signed)
Cardiology Office Note:    Date:  10/01/2021   ID:  Tyler Fess., DOB 09/23/1950, MRN [id number]  PCP:  Tonia Ghent, MD  Cardiologist:  Sinclair Grooms, MD   Referring MD: Tonia Ghent, MD   Chief Complaint  Patient presents with   Hospitalization Follow-up    Diabetes mellitus TIA Hyperlipidemia CAD Primary hypertension    History of Present Illness:    Tyler Nicastro. is a 71 y.o. male with a hx of CABG 2000 (LIMA--> LAD; SVG RCA, and Free radial to OM), hypertension, DM II, Hyperlipidemia, hypertension, h/o TIA, and NSTEMI 05/2021.[name]  Patient admitted with NSTEMI HS troponin 942 echo normal LVEF 60-65% normal wall motion,  Cardiac catheterization on 05/26/2021 showed occluded SVG to Diag with patent LIMA to LAD and SVG to PDA as well as severe native vessel disease with severe stenosis of 1st Diag. Patient underwent successful PCI with DES to 1st Diag lesion  He is compliant with his medications but not hitting targets for blood pressure control or lipid control.  LDL is 85 on max dose atorvastatin.  Blood pressure on low-dose metoprolol and Cozaar as above 140/80 mmHg.  Measured in both arms today running in the 160/92 mmHg range.  Denies angina.  Has had some migraines.  Has not had recurrent symptoms similar to his TIA that occurred in October.  We discussed the significance of his hemoglobin A1c of 6.7.  This is in range to be labeled a diabetic.  He wants instruction concerning diet and other measures to help improve this problem.  Past Medical History:  Diagnosis Date   Anxiety state 03/16/2016   Aphasia 01/26/2017   Benign localized prostatic hyperplasia with lower urinary tract symptoms (LUTS)    BPV (benign positional vertigo) 11/02/2014   Carotid artery stenosis    PER DUPLEX 10-22-2015  BILATERAL ICA 1-39%   Coronary artery disease    CARDIOLOGIST-  DR Daneen Schick--  VISIT EVERY OTHER YEAR-- LOV 2015   Coronary atherosclerosis 07/19/2010    Qualifier: Diagnosis of  By: [name] MD, Graham     Depression 01/26/2017   Diabetes mellitus without complication ([place]) 11/09/8117   GERD 07/19/2010   Qualifier: Diagnosis of  By: [name] MD, Phillip Heal     GERD (gastroesophageal reflux disease)    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   Gout    Gout 07/18/2010   Qualifier: Diagnosis of  By: [name] MD, Phillip Heal     H/O eye injury    chronic changes to left eye after injury   Hand weakness 03/02/2018   Hearing loss 04/21/2013   History of TIA (transient ischemic attack)    03/ 2017  no residual after brief episode loss peripheral vision   HOH (hard of hearing)    Hyperglycemia 05/17/2016   Hyperlipemia    Hyperlipidemia 07/18/2010   Qualifier: Diagnosis of  By: [name] MD, Graham     Hypertension    HYPERTENSION, BENIGN ESSENTIAL 07/18/2010   Qualifier: Diagnosis of  By: [name] MD, Graham     Hypokalemia 01/26/2017   Knee pain 03/13/2019   Left shoulder pain 09/04/2018   Pneumothorax 10/11/2019   RIGHT    Prostate cancer ([place]) UROLOGIST-  DR GRAPEY/  ONCOLOGIST-  DR MANNING   dx 2015--- Stage T1c, Gleason 3+4, PSA 4.03, vol 44cc   PVC's (premature ventricular contractions)    Rash and nonspecific skin eruption 10/14/2015   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   S/P CABG  x 05 Dec 1998   Skin lesion 10/14/2015   TIA (transient ischemic attack) 10/14/2015   Ventral hernia 10/26/2011   Wears glasses     Past Surgical History:  Procedure Laterality Date   APPENDECTOMY  1975   CARDIAC CATHETERIZATION  12-22-2001   dr Daneen Schick   severe native vessel disease mLAD 60-70%,  total occulsion pCFX  and pRCA/  widely patent saphenous vein , free radial , and LIMA grafts/  minminal lv dysfunction, ef 60%   CHEST TUBE INSERTION Right 10/11/2019   CORONARY ARTERY BYPASS GRAFT  May 2000   LIMA to LAD,  SVG to PDA and Diagonal, Free radial graft to OM   CORONARY STENT INTERVENTION N/A 05/26/2021   Procedure: CORONARY STENT INTERVENTION;  Surgeon:  Early Osmond, MD;  Location: [facility];  Service: Cardiovascular;  Laterality: N/A;   Exericse treadmill test  last one 01-03-2014  dr Daneen Schick   normal exercise tolerance w/ hypertensive repsonse,  no ischemic EKG changes, appropriate HR response & recovery ([facility] TM score 9;  Low Risk , PVC's w/ exertion)   LEFT HEART CATH AND CORS/GRAFTS ANGIOGRAPHY N/A 05/26/2021   Procedure: LEFT HEART CATH AND CORS/GRAFTS ANGIOGRAPHY;  Surgeon: Early Osmond, MD;  Location: [facility];  Service: Cardiovascular;  Laterality: N/A;   PROSTATE BIOPSY     RADIOACTIVE SEED IMPLANT N/A 01/13/2016   Procedure: RADIOACTIVE SEED IMPLANT/BRACHYTHERAPY IMPLANT;  Surgeon: Rana Snare, MD;  Location: [facility];  Service: Urology;  Laterality: N/A;    Current Medications: Current Meds  Medication Sig   allopurinol (ZYLOPRIM) 300 MG tablet TAKE 1 TABLET DAILY   aspirin EC 81 MG tablet Take 1 tablet (81 mg total) by mouth daily.   atorvastatin (LIPITOR) 80 MG tablet Take 1 tablet (80 mg total) by mouth daily.   Cholecalciferol (VITAMIN D PO) Take 1 capsule by mouth daily.   clopidogrel (PLAVIX) 75 MG tablet Take 1 tablet (75 mg total) by mouth daily with breakfast.   escitalopram (LEXAPRO) 5 MG tablet TAKE 1 TABLET DAILY   losartan-hydrochlorothiazide (HYZAAR) 50-12.5 MG tablet Take 1 tablet by mouth daily.   metoprolol tartrate (LOPRESSOR) 25 MG tablet Take 0.5 tablets (12.5 mg total) by mouth 2 (two) times daily.   Misc Natural Products (BLACK CHERRY CONCENTRATE PO) Take 1 tablet by mouth at bedtime.   Multiple Vitamins-Minerals (OCUVITE PO) Take 1 capsule by mouth daily.   nitroGLYCERIN (NITROSTAT) 0.4 MG SL tablet Place 1 tablet (0.4 mg total) under the tongue every 5 (five) minutes as needed for chest pain.   pantoprazole (PROTONIX) 40 MG tablet Take 1 tablet (40 mg total) by mouth daily.   Propylene Glycol (SYSTANE BALANCE) 0.6 % SOLN Place 1 drop into both eyes daily as  needed (dry eyes).   sodium chloride (OCEAN) 0.65 % SOLN nasal spray Place 1 spray into both nostrils as needed for congestion.   tamsulosin (FLOMAX) 0.4 MG CAPS capsule TAKE 1 CAPSULE TWICE A DAY   [DISCONTINUED] losartan (COZAAR) 25 MG tablet Take 1 tablet by mouth once daily     Allergies:   Patient has no known allergies.   Social History   Socioeconomic History   Marital status: Married    Spouse name: Not on file   Number of children: 2   Years of education: Not on file   Highest education level: Not on file  Occupational History   Occupation: Biochemist, clinical  Employer: [employer] [place]    Comment: [employer], travelling 3-4 nights a week  Tobacco Use   Smoking status: Never   Smokeless tobacco: Never  Vaping Use   Vaping Use: Never used  Substance and Sexual Activity   Alcohol use: No   Drug use: No   Sexual activity: Yes  Other Topics Concern   Not on file  Social History Narrative   Married 1974   2 daughters and 2 grandkids   Retired 2018.   Social Determinants of Health   Financial Resource Strain: Not on file  Food Insecurity: Not on file  Transportation Needs: Not on file  Physical Activity: Not on file  Stress: Not on file  Social Connections: Not on file     Family History: The patient's family history includes Cancer in his father, paternal uncle, and sister; Dementia in his mother; Heart disease in his father; Prostate cancer in his father; Stroke in his father. There is no history of Colon cancer.  ROS:   Please see the history of present illness.    Recurring migraines.  Decreased hearing bilaterally.  Denies orthopnea and PND.  Wavy scotoma occur prior to migraines.  All other systems reviewed and are negative.  EKGs/Labs/Other Studies Reviewed:    The following studies were reviewed today: No new or recent data.  Please refer to the October cath report Coronary Diagrams  Diagnostic Dominance: Right Intervention   EKG:  EKG not  repeated  Recent Labs: 05/24/2021: ALT 23 05/27/2021: BUN 17; Creatinine, Ser 1.15; Hemoglobin 12.6; Platelets 161; Potassium 4.6; Sodium 140  Recent Lipid Panel    Component Value Date/Time   CHOL 154 05/25/2021 0251   TRIG 98 05/25/2021 0251   HDL 49 05/25/2021 0251   CHOLHDL 3.1 05/25/2021 0251   VLDL 20 05/25/2021 0251   LDLCALC 85 05/25/2021 0251   LDLDIRECT 97.0 10/01/2020 0916    Physical Exam:    VS:  BP (!) 140/94    Pulse 69    Ht 5\' 10"  (1.778 m)    Wt 220 lb 3.2 oz (99.9 kg)    SpO2 98%    BMI 31.60 kg/m     Wt Readings from Last 3 Encounters:  10/01/21 220 lb 3.2 oz (99.9 kg)  06/10/21 221 lb 9.6 oz (100.5 kg)  05/27/21 212 lb 4.8 oz (96.3 kg)     GEN: Overweight. No acute distress HEENT: Normal NECK: No JVD. LYMPHATICS: No lymphadenopathy CARDIAC: 1-2/6 right upper sternal systolic murmur. RRR no gallop, or edema. VASCULAR:  Normal Pulses. No bruits. RESPIRATORY:  Clear to auscultation without rales, wheezing or rhonchi  ABDOMEN: Soft, non-tender, non-distended, No pulsatile mass, MUSCULOSKELETAL: No deformity  SKIN: Warm and dry NEUROLOGIC:  Alert and oriented x 3 PSYCHIATRIC:  Normal affect   ASSESSMENT:    1. Coronary artery disease involving coronary bypass graft of native heart without angina pectoris   2. Essential hypertension   3. Hyperlipidemia, unspecified hyperlipidemia type   4. Diabetes mellitus without complication ([place])   5. TIA (transient ischemic attack)    PLAN:    In order of problems listed above:  Secondary prevention discussed, especially with blood pressure control and suboptimal lipid management. Increase losartan to losartan HCT 50/12.5 mg daily.  Bmet in 2 weeks. Heart healthy diabetic diet today.  Lipids have been drawn today.  LDL target should be less than 55 given his complex atherosclerotic state.  May need to add Zetia or escalate to PCSK9 therapy. Consider adding  SGLT2 therapy. Aggressive risk  reduction.   Overall education and awareness concerning secondary risk prevention was discussed in detail: LDL less than 70, hemoglobin A1c less than 7, blood pressure target less than 130/80 mmHg, >150 minutes of moderate aerobic activity per week, avoidance of smoking, weight control (via diet and exercise), and continued surveillance/management of/for obstructive sleep apnea.    Medication Adjustments/Labs and Tests Ordered: Current medicines are reviewed at length with the patient today.  Concerns regarding medicines are outlined above.  Orders Placed This Encounter  Procedures   Basic metabolic panel   Meds ordered this encounter  Medications   losartan-hydrochlorothiazide (HYZAAR) 50-12.5 MG tablet    Sig: Take 1 tablet by mouth daily.    Dispense:  90 tablet    Refill:  3       Signed, Sinclair Grooms, MD  10/01/2021 11:13 AM    [place]

## 2021-10-01 ENCOUNTER — Ambulatory Visit (INDEPENDENT_AMBULATORY_CARE_PROVIDER_SITE_OTHER): Payer: Medicare Other | Admitting: Interventional Cardiology

## 2021-10-01 ENCOUNTER — Encounter: Payer: Self-pay | Admitting: Interventional Cardiology

## 2021-10-01 ENCOUNTER — Other Ambulatory Visit: Payer: Medicare Other | Admitting: *Deleted

## 2021-10-01 ENCOUNTER — Other Ambulatory Visit: Payer: Self-pay

## 2021-10-01 VITALS — BP 140/94 | HR 69 | Ht 70.0 in | Wt 220.2 lb

## 2021-10-01 DIAGNOSIS — E785 Hyperlipidemia, unspecified: Secondary | ICD-10-CM | POA: Diagnosis not present

## 2021-10-01 DIAGNOSIS — I10 Essential (primary) hypertension: Secondary | ICD-10-CM

## 2021-10-01 DIAGNOSIS — E119 Type 2 diabetes mellitus without complications: Secondary | ICD-10-CM

## 2021-10-01 DIAGNOSIS — I25810 Atherosclerosis of coronary artery bypass graft(s) without angina pectoris: Secondary | ICD-10-CM

## 2021-10-01 DIAGNOSIS — G459 Transient cerebral ischemic attack, unspecified: Secondary | ICD-10-CM

## 2021-10-01 LAB — LIPID PANEL
Chol/HDL Ratio: 2.9 ratio (ref 0.0–5.0)
Cholesterol, Total: 126 mg/dL (ref 100–199)
HDL: 44 mg/dL (ref >39)
LDL Chol Calc (NIH): 64 mg/dL (ref 0–99)
Triglycerides: 95 mg/dL (ref 0–149)
VLDL Cholesterol Cal: 18 mg/dL (ref 5–40)

## 2021-10-01 MED ORDER — LOSARTAN POTASSIUM-HCTZ 50-12.5 MG PO TABS: 1.0000 | ORAL_TABLET | Freq: Every day | ORAL | 3 refills | Status: DC

## 2021-10-01 NOTE — Patient Instructions (Signed)
Medication Instructions:  1) DISCONTINUE plain Losartan 2) START Losartan/HCTZ 50/12.5mg  once daily  *If you need a refill on your cardiac medications before your next appointment, please call your pharmacy*   Lab Work: BMET in 2-4 weeks  If you have labs (blood work) drawn today and your tests are completely normal, you will receive your results only by: [place] (if you have MyChart) OR A paper copy in the mail If you have any lab test that is abnormal or we need to change your treatment, we will call you to review the results.   Testing/Procedures: None   Follow-Up: At [facility], you and your health needs are our priority.  As part of our continuing mission to provide you with exceptional heart care, we have created designated Provider Care Teams.  These Care Teams include your primary Cardiologist (physician) and Advanced Practice Providers (APPs -  Physician Assistants and Nurse Practitioners) who all work together to provide you with the care you need, when you need it.  We recommend signing up for the patient portal called "MyChart".  Sign up information is provided on this After Visit Summary.  MyChart is used to connect with patients for Virtual Visits (Telemedicine).  Patients are able to view lab/test results, encounter notes, upcoming appointments, etc.  Non-urgent messages can be sent to your provider as well.   To learn more about what you can do with MyChart, go to [email or web address].    Your next appointment:   3 month(s)  The format for your next appointment:   In Person  Provider:   Sinclair Grooms, MD     Other Instructions Diabetes Mellitus and Nutrition, Adult When you have diabetes, or diabetes mellitus, it is very important to have healthy eating habits because your blood sugar (glucose) levels are greatly affected by what you eat and drink. Eating healthy foods in the right amounts, at about the same times every day, can help  you: Manage your blood glucose. Lower your risk of heart disease. Improve your blood pressure. Reach or maintain a healthy weight. What can affect my meal plan? Every person with diabetes is different, and each person has different needs for a meal plan. Your health care provider may recommend that you work with a dietitian to make a meal plan that is best for you. Your meal plan may vary depending on factors such as: The calories you need. The medicines you take. Your weight. Your blood glucose, blood pressure, and cholesterol levels. Your activity level. Other health conditions you have, such as heart or kidney disease. How do carbohydrates affect me? Carbohydrates, also called carbs, affect your blood glucose level more than any other type of food. Eating carbs raises the amount of glucose in your blood. It is important to know how many carbs you can safely have in each meal. This is different for every person. Your dietitian can help you calculate how many carbs you should have at each meal and for each snack. How does alcohol affect me? Alcohol can cause a decrease in blood glucose (hypoglycemia), especially if you use insulin or take certain diabetes medicines by mouth. Hypoglycemia can be a life-threatening condition. Symptoms of hypoglycemia, such as sleepiness, dizziness, and confusion, are similar to symptoms of having too much alcohol. Do not drink alcohol if: Your health care provider tells you not to drink. You are pregnant, may be pregnant, or are planning to become pregnant. If you drink alcohol: Limit how much you have to:  0-1 drink a day for women. 0-2 drinks a day for men. Know how much alcohol is in your drink. In the [place] one drink equals one 12 oz bottle of beer (355 mL), one 5 oz glass of wine (148 mL), or one 1 oz glass of hard liquor (44 mL). Keep yourself hydrated with water, diet soda, or unsweetened iced tea. Keep in mind that regular soda, juice, and other  mixers may contain a lot of sugar and must be counted as carbs. What are tips for following this plan? Reading food labels Start by checking the serving size on the Nutrition Facts label of packaged foods and drinks. The number of calories and the amount of carbs, fats, and other nutrients listed on the label are based on one serving of the item. Many items contain more than one serving per package. Check the total grams (g) of carbs in one serving. Check the number of grams of saturated fats and trans fats in one serving. Choose foods that have a low amount or none of these fats. Check the number of milligrams (mg) of salt (sodium) in one serving. Most people should limit total sodium intake to less than 2,300 mg per day. Always check the nutrition information of foods labeled as "low-fat" or "nonfat." These foods may be higher in added sugar or refined carbs and should be avoided. Talk to your dietitian to identify your daily goals for nutrients listed on the label. Shopping Avoid buying canned, pre-made, or processed foods. These foods tend to be high in fat, sodium, and added sugar. Shop around the outside edge of the grocery store. This is where you will most often find fresh fruits and vegetables, bulk grains, fresh meats, and fresh dairy products. Cooking Use low-heat cooking methods, such as baking, instead of high-heat cooking methods, such as deep frying. Cook using healthy oils, such as olive, canola, or sunflower oil. Avoid cooking with butter, cream, or high-fat meats. Meal planning Eat meals and snacks regularly, preferably at the same times every day. Avoid going long periods of time without eating. Eat foods that are high in fiber, such as fresh fruits, vegetables, beans, and whole grains. Eat 4-6 oz (112-168 g) of lean protein each day, such as lean meat, chicken, fish, eggs, or tofu. One ounce (oz) (28 g) of lean protein is equal to: 1 oz (28 g) of meat, chicken, or fish. 1  egg.  cup (62 g) of tofu. Eat some foods each day that contain healthy fats, such as avocado, nuts, seeds, and fish. What foods should I eat? Fruits Berries. Apples. Oranges. Peaches. Apricots. Plums. Grapes. Mangoes. Papayas. Pomegranates. Kiwi. Cherries. Vegetables Leafy greens, including lettuce, spinach, kale, chard, collard greens, mustard greens, and cabbage. Beets. Cauliflower. Broccoli. Carrots. Green beans. Tomatoes. Peppers. Onions. Cucumbers. Brussels sprouts. Grains Whole grains, such as whole-wheat or whole-grain bread, crackers, tortillas, cereal, and pasta. Unsweetened oatmeal. Quinoa. Brown or wild rice. Meats and other proteins Seafood. Poultry without skin. Lean cuts of poultry and beef. Tofu. Nuts. Seeds. Dairy Low-fat or fat-free dairy products such as milk, yogurt, and cheese. The items listed above may not be a complete list of foods and beverages you can eat and drink. Contact a dietitian for more information. What foods should I avoid? Fruits Fruits canned with syrup. Vegetables Canned vegetables. Frozen vegetables with butter or cream sauce. Grains Refined white flour and flour products such as bread, pasta, snack foods, and cereals. Avoid all processed foods. Meats and other proteins Fatty  cuts of meat. Poultry with skin. Breaded or fried meats. Processed meat. Avoid saturated fats. Dairy Full-fat yogurt, cheese, or milk. Beverages Sweetened drinks, such as soda or iced tea. The items listed above may not be a complete list of foods and beverages you should avoid. Contact a dietitian for more information. Questions to ask a health care provider Do I need to meet with a certified diabetes care and education specialist? Do I need to meet with a dietitian? What number can I call if I have questions? When are the best times to check my blood glucose? Where to find more information: American Diabetes Association: diabetes.org Academy of Nutrition and  Dietetics: eatright.[employer] of Diabetes and Digestive and Kidney Diseases: [employer] Association of Diabetes Care & Education Specialists: diabeteseducator.org Summary It is important to have healthy eating habits because your blood sugar (glucose) levels are greatly affected by what you eat and drink. It is important to use alcohol carefully. A healthy meal plan will help you manage your blood glucose and lower your risk of heart disease. Your health care provider may recommend that you work with a dietitian to make a meal plan that is best for you. This information is not intended to replace advice given to you by your health care provider. Make sure you discuss any questions you have with your health care provider. Document Revised: 02/21/2020 Document Reviewed: 02/21/2020 Elsevier Patient Education  [place].

## 2021-10-07 ENCOUNTER — Other Ambulatory Visit: Payer: Self-pay | Admitting: *Deleted

## 2021-10-07 MED ORDER — LOSARTAN POTASSIUM-HCTZ 50-12.5 MG PO TABS: 1.0000 | ORAL_TABLET | Freq: Every day | ORAL | 3 refills | Status: DC

## 2021-10-13 ENCOUNTER — Other Ambulatory Visit: Payer: Self-pay

## 2021-10-16 MED ORDER — CLOPIDOGREL BISULFATE 75 MG PO TABS: 75.0000 mg | ORAL_TABLET | Freq: Every day | ORAL | 3 refills | Status: DC

## 2021-10-16 MED ORDER — PANTOPRAZOLE SODIUM 40 MG PO TBEC: 40.0000 mg | DELAYED_RELEASE_TABLET | Freq: Every day | ORAL | 3 refills | Status: DC

## 2021-10-16 MED ORDER — ATORVASTATIN CALCIUM 80 MG PO TABS: 80.0000 mg | ORAL_TABLET | Freq: Every day | ORAL | 3 refills | Status: DC

## 2021-10-22 ENCOUNTER — Other Ambulatory Visit: Payer: Self-pay

## 2021-10-22 ENCOUNTER — Other Ambulatory Visit: Payer: Medicare Other | Admitting: *Deleted

## 2021-10-22 DIAGNOSIS — I10 Essential (primary) hypertension: Secondary | ICD-10-CM

## 2021-10-22 LAB — BASIC METABOLIC PANEL
BUN/Creatinine Ratio: 18 (ref 10–24)
BUN: 20 mg/dL (ref 8–27)
CO2: 28 mmol/L (ref 20–29)
Calcium: 9.4 mg/dL (ref 8.6–10.2)
Chloride: 104 mmol/L (ref 96–106)
Creatinine, Ser: 1.11 mg/dL (ref 0.76–1.27)
Glucose: 140 mg/dL — ABNORMAL HIGH (ref 70–99)
Potassium: 3.9 mmol/L (ref 3.5–5.2)
Sodium: 142 mmol/L (ref 134–144)
eGFR: 72 mL/min/{1.73_m2} (ref >59)

## 2021-12-19 ENCOUNTER — Ambulatory Visit (INDEPENDENT_AMBULATORY_CARE_PROVIDER_SITE_OTHER): Payer: Medicare Other

## 2021-12-19 VITALS — Ht 70.0 in | Wt 213.0 lb

## 2021-12-19 DIAGNOSIS — Z0000 Encounter for general adult medical examination without abnormal findings: Secondary | ICD-10-CM | POA: Diagnosis not present

## 2021-12-19 NOTE — Patient Instructions (Signed)
Tyler Guerra , Thank you for taking time to come for your Medicare Wellness Visit. I appreciate your ongoing commitment to your health goals. Please review the following plan we discussed and let me know if I can assist you in the future.   Screening recommendations/referrals: Colonoscopy: completed 01/19/2017, due 01/20/2027 Recommended yearly ophthalmology/optometry visit for glaucoma screening and checkup Recommended yearly dental visit for hygiene and checkup  Vaccinations: Influenza vaccine: due 03/03/2022 Pneumococcal vaccine: completed 03/01/2018 Tdap vaccine: completed 05/15/2016, due 05/15/2026 Shingles vaccine: discussed   Covid-19:  07/23/2020, 10/18/2019, 09/25/2019  Advanced directives: Advance directive discussed with you today.  Conditions/risks identified: none  Next appointment: Follow up in one year for your annual wellness visit.   Preventive Care 70 Years and Older, Male Preventive care refers to lifestyle choices and visits with your health care provider that can promote health and wellness. What does preventive care include? A yearly physical exam. This is also called an annual well check. Dental exams once or twice a year. Routine eye exams. Ask your health care provider how often you should have your eyes checked. Personal lifestyle choices, including: Daily care of your teeth and gums. Regular physical activity. Eating a healthy diet. Avoiding tobacco and drug use. Limiting alcohol use. Practicing safe sex. Taking low doses of aspirin every day. Taking vitamin and mineral supplements as recommended by your health care provider. What happens during an annual well check? The services and screenings done by your health care provider during your annual well check will depend on your age, overall health, lifestyle risk factors, and family history of disease. Counseling  Your health care provider may ask you questions about your: Alcohol use. Tobacco use. Drug  use. Emotional well-being. Home and relationship well-being. Sexual activity. Eating habits. History of falls. Memory and ability to understand (cognition). Work and work Statistician. Screening  You may have the following tests or measurements: Height, weight, and BMI. Blood pressure. Lipid and cholesterol levels. These may be checked every 5 years, or more frequently if you are over 37 years old. Skin check. Lung cancer screening. You may have this screening every year starting at age 79 if you have a 30-pack-year history of smoking and currently smoke or have quit within the past 15 years. Fecal occult blood test (FOBT) of the stool. You may have this test every year starting at age 51. Flexible sigmoidoscopy or colonoscopy. You may have a sigmoidoscopy every 5 years or a colonoscopy every 10 years starting at age 48. Prostate cancer screening. Recommendations will vary depending on your family history and other risks. Hepatitis C blood test. Hepatitis B blood test. Sexually transmitted disease (STD) testing. Diabetes screening. This is done by checking your blood sugar (glucose) after you have not eaten for a while (fasting). You may have this done every 1-3 years. Abdominal aortic aneurysm (AAA) screening. You may need this if you are a current or former smoker. Osteoporosis. You may be screened starting at age 77 if you are at high risk. Talk with your health care provider about your test results, treatment options, and if necessary, the need for more tests. Vaccines  Your health care provider may recommend certain vaccines, such as: Influenza vaccine. This is recommended every year. Tetanus, diphtheria, and acellular pertussis (Tdap, Td) vaccine. You may need a Td booster every 10 years. Zoster vaccine. You may need this after age 2. Pneumococcal 13-valent conjugate (PCV13) vaccine. One dose is recommended after age 67. Pneumococcal polysaccharide (PPSV23) vaccine. One dose is  recommended after age 63. Talk to your health care provider about which screenings and vaccines you need and how often you need them. This information is not intended to replace advice given to you by your health care provider. Make sure you discuss any questions you have with your health care provider. Document Released: 08/16/2015 Document Revised: 04/08/2016 Document Reviewed: 05/21/2015 Elsevier Interactive Patient Education  2017 [place] Prevention in the Home Falls can cause injuries. They can happen to people of all ages. There are many things you can do to make your home safe and to help prevent falls. What can I do on the outside of my home? Regularly fix the edges of walkways and driveways and fix any cracks. Remove anything that might make you trip as you walk through a door, such as a raised step or threshold. Trim any bushes or trees on the path to your home. Use bright outdoor lighting. Clear any walking paths of anything that might make someone trip, such as rocks or tools. Regularly check to see if handrails are loose or broken. Make sure that both sides of any steps have handrails. Any raised decks and porches should have guardrails on the edges. Have any leaves, snow, or ice cleared regularly. Use sand or salt on walking paths during winter. Clean up any spills in your garage right away. This includes oil or grease spills. What can I do in the bathroom? Use night lights. Install grab bars by the toilet and in the tub and shower. Do not use towel bars as grab bars. Use non-skid mats or decals in the tub or shower. If you need to sit down in the shower, use a plastic, non-slip stool. Keep the floor dry. Clean up any water that spills on the floor as soon as it happens. Remove soap buildup in the tub or shower regularly. Attach bath mats securely with double-sided non-slip rug tape. Do not have throw rugs and other things on the floor that can make you  trip. What can I do in the bedroom? Use night lights. Make sure that you have a light by your bed that is easy to reach. Do not use any sheets or blankets that are too big for your bed. They should not hang down onto the floor. Have a firm chair that has side arms. You can use this for support while you get dressed. Do not have throw rugs and other things on the floor that can make you trip. What can I do in the kitchen? Clean up any spills right away. Avoid walking on wet floors. Keep items that you use a lot in easy-to-reach places. If you need to reach something above you, use a strong step stool that has a grab bar. Keep electrical cords out of the way. Do not use floor polish or wax that makes floors slippery. If you must use wax, use non-skid floor wax. Do not have throw rugs and other things on the floor that can make you trip. What can I do with my stairs? Do not leave any items on the stairs. Make sure that there are handrails on both sides of the stairs and use them. Fix handrails that are broken or loose. Make sure that handrails are as long as the stairways. Check any carpeting to make sure that it is firmly attached to the stairs. Fix any carpet that is loose or worn. Avoid having throw rugs at the top or bottom of the stairs. If you  do have throw rugs, attach them to the floor with carpet tape. Make sure that you have a light switch at the top of the stairs and the bottom of the stairs. If you do not have them, ask someone to add them for you. What else can I do to help prevent falls? Wear shoes that: Do not have high heels. Have rubber bottoms. Are comfortable and fit you well. Are closed at the toe. Do not wear sandals. If you use a stepladder: Make sure that it is fully opened. Do not climb a closed stepladder. Make sure that both sides of the stepladder are locked into place. Ask someone to hold it for you, if possible. Clearly mark and make sure that you can  see: Any grab bars or handrails. First and last steps. Where the edge of each step is. Use tools that help you move around (mobility aids) if they are needed. These include: Canes. Walkers. Scooters. Crutches. Turn on the lights when you go into a dark area. Replace any light bulbs as soon as they burn out. Set up your furniture so you have a clear path. Avoid moving your furniture around. If any of your floors are uneven, fix them. If there are any pets around you, be aware of where they are. Review your medicines with your doctor. Some medicines can make you feel dizzy. This can increase your chance of falling. Ask your doctor what other things that you can do to help prevent falls. This information is not intended to replace advice given to you by your health care provider. Make sure you discuss any questions you have with your health care provider. Document Released: 05/16/2009 Document Revised: 12/26/2015 Document Reviewed: 08/24/2014 Elsevier Interactive Patient Education  2017 [employer].

## 2021-12-19 NOTE — Progress Notes (Signed)
I connected with Tyler Guerra today by telephone and verified that I am speaking with the correct person using two identifiers. Location patient: home Location provider: work Persons participating in the virtual visit: Tyler Guerra, Mrs. Marvelous, Bouwens LPN.   I discussed the limitations, risks, security and privacy concerns of performing an evaluation and management service by telephone and the availability of in person appointments. I also discussed with the patient that there may be a patient responsible charge related to this service. The patient expressed understanding and verbally consented to this telephonic visit.    Interactive audio and video telecommunications were attempted between this provider and patient, however failed, due to patient having technical difficulties OR patient did not have access to video capability.  We continued and completed visit with audio only.     Vital signs may be patient reported or missing.  Subjective:   Tyler Guerra. is a 71 y.o. male who presents for Medicare Annual/Subsequent preventive examination.  Review of Systems     Cardiac Risk Factors include: advanced age (>10mn, >>75women);diabetes mellitus;dyslipidemia;hypertension;male gender;obesity (BMI >30kg/m2)     Objective:    Today's Vitals   12/19/21 1527  Weight: 213 lb (96.6 kg)  Height: '5\' 10"'$  (1.778 m)   Body mass index is 30.56 kg/m.     12/19/2021    3:32 PM 05/27/2021   10:32 AM 05/25/2021    8:46 AM 10/11/2019    4:19 PM 03/01/2018   12:58 PM 01/27/2017    2:00 PM 03/22/2016    5:35 PM  Advanced Directives  Does Patient Have a Medical Advance Directive? No  No No No No No  Would patient like information on creating a medical advance directive?  No - Patient declined  No - Patient declined Yes (MAU/Ambulatory/Procedural Areas - Information given) No - Patient declined No - patient declined information    Current Medications (verified) Outpatient  Encounter Medications as of 12/19/2021  Medication Sig   allopurinol (ZYLOPRIM) 300 MG tablet TAKE 1 TABLET DAILY   aspirin EC 81 MG tablet Take 1 tablet (81 mg total) by mouth daily.   atorvastatin (LIPITOR) 80 MG tablet Take 1 tablet (80 mg total) by mouth daily.   Cholecalciferol (VITAMIN D PO) Take 1 capsule by mouth daily.   clopidogrel (PLAVIX) 75 MG tablet Take 1 tablet (75 mg total) by mouth daily with breakfast.   escitalopram (LEXAPRO) 5 MG tablet TAKE 1 TABLET DAILY   losartan-hydrochlorothiazide (HYZAAR) 50-12.5 MG tablet Take 1 tablet by mouth daily.   metoprolol tartrate (LOPRESSOR) 25 MG tablet Take 0.5 tablets (12.5 mg total) by mouth 2 (two) times daily.   Misc Natural Products (BLACK CHERRY CONCENTRATE PO) Take 1 tablet by mouth at bedtime.   Multiple Vitamins-Minerals (OCUVITE PO) Take 1 capsule by mouth daily.   nitroGLYCERIN (NITROSTAT) 0.4 MG SL tablet Place 1 tablet (0.4 mg total) under the tongue every 5 (five) minutes as needed for chest pain.   pantoprazole (PROTONIX) 40 MG tablet Take 1 tablet (40 mg total) by mouth daily.   Propylene Glycol (SYSTANE BALANCE) 0.6 % SOLN Place 1 drop into both eyes daily as needed (dry eyes).   sodium chloride (OCEAN) 0.65 % SOLN nasal spray Place 1 spray into both nostrils as needed for congestion.   tamsulosin (FLOMAX) 0.4 MG CAPS capsule TAKE 1 CAPSULE TWICE A DAY   No facility-administered encounter medications on file as of 12/19/2021.    Allergies (verified) Patient has no  known allergies.   History: Past Medical History:  Diagnosis Date   Anxiety state 03/16/2016   Aphasia 01/26/2017   Benign localized prostatic hyperplasia with lower urinary tract symptoms (LUTS)    BPV (benign positional vertigo) 11/02/2014   Carotid artery stenosis    PER DUPLEX 10-22-2015  BILATERAL ICA 1-39%   Coronary artery disease    CARDIOLOGIST-  DR Daneen Schick--  VISIT EVERY OTHER YEAR-- LOV 2015   Coronary atherosclerosis 07/19/2010    Qualifier: Diagnosis of  By: [name] MD, Graham     Depression 01/26/2017   Diabetes mellitus without complication ([place]) 7/32/2025   GERD 07/19/2010   Qualifier: Diagnosis of  By: [name] MD, Phillip Heal     GERD (gastroesophageal reflux disease)    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   Gout    Gout 07/18/2010   Qualifier: Diagnosis of  By: [name] MD, Phillip Heal     H/O eye injury    chronic changes to left eye after injury   Hand weakness 03/02/2018   Hearing loss 04/21/2013   History of TIA (transient ischemic attack)    03/ 2017  no residual after brief episode loss peripheral vision   HOH (hard of hearing)    Hyperglycemia 05/17/2016   Hyperlipemia    Hyperlipidemia 07/18/2010   Qualifier: Diagnosis of  By: [name] MD, Graham     Hypertension    HYPERTENSION, BENIGN ESSENTIAL 07/18/2010   Qualifier: Diagnosis of  By: [name] MD, Graham     Hypokalemia 01/26/2017   Knee pain 03/13/2019   Left shoulder pain 09/04/2018   Pneumothorax 10/11/2019   RIGHT    Prostate cancer (Tyler Guerra) UROLOGIST-  DR GRAPEY/  ONCOLOGIST-  DR MANNING   dx 2015--- Stage T1c, Gleason 3+4, PSA 4.03, vol 44cc   PVC's (premature ventricular contractions)    Rash and nonspecific skin eruption 10/14/2015   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   S/P CABG x 05 Dec 1998   Skin lesion 10/14/2015   TIA (transient ischemic attack) 10/14/2015   Ventral hernia 10/26/2011   Wears glasses    Past Surgical History:  Procedure Laterality Date   [place]  12-22-2001   dr Daneen Schick   severe native vessel disease mLAD 60-70%,  total occulsion pCFX  and pRCA/  widely patent saphenous vein , free radial , and LIMA grafts/  minminal lv dysfunction, ef 60%   CHEST TUBE INSERTION Right 10/11/2019   CORONARY ARTERY BYPASS GRAFT  May 2000   LIMA to LAD,  SVG to PDA and Diagonal, Free radial graft to OM   CORONARY STENT INTERVENTION N/A 05/26/2021   Procedure: CORONARY STENT INTERVENTION;  Surgeon:  Early Osmond, MD;  Location: [facility];  Service: Cardiovascular;  Laterality: N/A;   Exericse treadmill test  last one 01-03-2014  dr Daneen Schick   normal exercise tolerance w/ hypertensive repsonse,  no ischemic EKG changes, appropriate HR response & recovery ([facility] TM score 9;  Low Risk , PVC's w/ exertion)   LEFT HEART CATH AND CORS/GRAFTS ANGIOGRAPHY N/A 05/26/2021   Procedure: LEFT HEART CATH AND CORS/GRAFTS ANGIOGRAPHY;  Surgeon: Early Osmond, MD;  Location: [facility];  Service: Cardiovascular;  Laterality: N/A;   PROSTATE BIOPSY     RADIOACTIVE SEED IMPLANT N/A 01/13/2016   Procedure: RADIOACTIVE SEED IMPLANT/BRACHYTHERAPY IMPLANT;  Surgeon: Rana Snare, MD;  Location: [facility];  Service: Urology;  Laterality: N/A;   Family  History  Problem Relation Age of Onset   Cancer Father        prostate in eighties   Prostate cancer Father    Heart disease Father    Stroke Father    Dementia Mother    Cancer Sister        breast   Cancer Paternal Uncle        prostate   Colon cancer Neg Hx    Social History   Socioeconomic History   Marital status: Married    Spouse name: Not on file   Number of children: 2   Years of education: Not on file   Highest education level: Not on file  Occupational History   Occupation: Press photographer REP    Employer: [employer] [place]    Comment: [employer], travelling 3-4 nights a week  Tobacco Use   Smoking status: Never   Smokeless tobacco: Never  Vaping Use   Vaping Use: Never used  Substance and Sexual Activity   Alcohol use: No   Drug use: No   Sexual activity: Yes  Other Topics Concern   Not on file  Social History Narrative   Married 1974   2 daughters and 2 grandkids   Retired 2018.   Social Determinants of Health   Financial Resource Strain: Low Risk    Difficulty of Paying Living Expenses: Not hard at all  Food Insecurity: No Food Insecurity   Worried About Charity fundraiser in  the Last Year: Never true   [place] in the Last Year: Never true  Transportation Needs: No Transportation Needs   Lack of Transportation (Medical): No   Lack of Transportation (Non-Medical): No  Physical Activity: Inactive   Days of Exercise per Week: 0 days   Minutes of Exercise per Session: 0 min  Stress: No Stress Concern Present   Feeling of Stress : Not at all  Social Connections: Not on file    Tobacco Counseling Counseling given: Not Answered   Clinical Intake:  Pre-visit preparation completed: Yes  Pain : No/denies pain     Nutritional Status: BMI > 30  Obese Nutritional Risks: None Diabetes: Yes  How often do you need to have someone help you when you read instructions, pamphlets, or other written materials from your doctor or pharmacy?: 1 - Never What is the last grade level you completed in school?: 12th grade  Diabetic? Yes Nutrition Risk Assessment:  Has the patient had any N/V/D within the last 2 months?  No  Does the patient have any non-healing wounds?  No  Has the patient had any unintentional weight loss or weight gain?  No   Diabetes:  Is the patient diabetic?  Yes  If diabetic, was a CBG obtained today?  No  Did the patient bring in their glucometer from home?  No  How often do you monitor your CBG's? Does not.   Financial Strains and Diabetes Management:  Are you having any financial strains with the device, your supplies or your medication? No .  Does the patient want to be seen by Chronic Care Management for management of their diabetes?  No  Would the patient like to be referred to a Nutritionist or for Diabetic Management?  No   Diabetic Exams:  Diabetic Eye Exam: Overdue for diabetic eye exam. Pt has been advised about the importance in completing this exam. Patient advised to call and schedule an eye exam. Diabetic Foot Exam: Overdue, Pt has been advised  about the importance in completing this exam. Pt is scheduled for diabetic  foot exam on next appointment.   Interpreter Needed?: No  Information entered by :: NAllen LPN   Activities of Daily Living    12/19/2021    3:33 PM 05/26/2021   10:00 AM  In your present state of health, do you have any difficulty performing the following activities:  Hearing? 1 1  Comment hearing aids   Vision? 0 0  Difficulty concentrating or making decisions? 0 0  Walking or climbing stairs? 0 0  Dressing or bathing? 0 0  Doing errands, shopping?  0  Preparing Food and eating ? N   Using the Toilet? N   In the past six months, have you accidently leaked urine? N   Do you have problems with loss of bowel control? N   Managing your Medications? N   Managing your Finances? N   Housekeeping or managing your Housekeeping? N     Patient Care Team: Tonia Ghent, MD as PCP - General Tamala Julian Lynnell Dike, MD as PCP - Cardiology (Cardiology) Rolley Sims, OD (Orthopedic Surgery)  Indicate any recent Medical Services you may have received from other than [facility] providers in the past year (date may be approximate).     Assessment:   This is a routine wellness examination for Zachrey.  Hearing/Vision screen Vision Screening - Comments:: Regular eye exams, Dr. Orvan Seen  Dietary issues and exercise activities discussed: Current Exercise Habits: The patient does not participate in regular exercise at present   Goals Addressed             This Visit's Progress    Patient Stated       12/19/2021, wants to lose weight and keep sugar to a minimum       Depression Screen    12/19/2021    3:33 PM 03/01/2018   12:56 PM 08/19/2015    4:03 PM 08/19/2015    3:37 PM  PHQ 2/9 Scores  PHQ - 2 Score 0 0 0 0  PHQ- 9 Score  0      Fall Risk    12/19/2021    3:33 PM 10/03/2020    8:16 AM 06/27/2019    1:08 PM 03/01/2018   12:56 PM 08/19/2015    4:03 PM  Fall Risk   Falls in the past year? 0 0 0 Yes No  Comment   Emmi Telephone Survey: data to providers prior to load pt fell while  walking dog and caused injury to left knee   Number falls in past yr: 0 0  1   Injury with Fall? 0 0  Yes   Risk for fall due to : Medication side effect History of fall(s)     Follow up Falls evaluation completed;Education provided;Falls prevention discussed Falls evaluation completed       FALL RISK PREVENTION PERTAINING TO THE HOME:  Any stairs in or around the home? Yes  If so, are there any without handrails? No  Home free of loose throw rugs in walkways, pet beds, electrical cords, etc? Yes  Adequate lighting in your home to reduce risk of falls? Yes   ASSISTIVE DEVICES UTILIZED TO PREVENT FALLS:  Life alert? No  Use of a cane, walker or w/c? No  Grab bars in the bathroom? Yes  Shower chair or bench in shower? No  Elevated toilet seat or a handicapped toilet? No   TIMED UP AND GO:  Was the test performed? No .  Cognitive Function:    03/01/2018   12:59 PM  MMSE - Mini Mental State Exam  Orientation to time 5  Orientation to Place 5  Registration 3  Attention/ Calculation 0  Recall 3  Language- name 2 objects 0  Language- repeat 1  Language- follow 3 step command 3  Language- read & follow direction 0  Write a sentence 0  Copy design 0  Total score 20        12/19/2021    3:36 PM  6CIT Screen  What Year? 0 points  What month? 0 points  What time? 0 points  Count back from 20 0 points  Months in reverse 0 points  Repeat phrase 4 points  Total Score 4 points    Immunizations Immunization History  Administered Date(s) Administered   Fluad Quad(high Dose 65+) 04/25/2019, 05/18/2020, 04/08/2021   Influenza Split 04/28/2012   Influenza Whole 05/03/2010   Influenza,inj,Quad PF,6+ Mos 04/20/2013, 05/11/2014, 05/31/2015, 05/11/2016, 08/11/2018   PFIZER(Purple Top)SARS-COV-2 Vaccination 09/25/2019, 10/18/2019   Pneumococcal Conjugate-13 03/01/2018   Pneumococcal Polysaccharide-23 08/04/2007, 04/20/2013   Td 08/03/2005   Tdap 05/15/2016   Zoster,  Live 10/06/2015    TDAP status: Up to date  Flu Vaccine status: Up to date  Pneumococcal vaccine status: Up to date  Covid-19 vaccine status: Completed vaccines  Qualifies for Shingles Vaccine? Yes   Zostavax completed Yes   Shingrix Completed?: No.    Education has been provided regarding the importance of this vaccine. Patient has been advised to call insurance company to determine out of pocket expense if they have not yet received this vaccine. Advised may also receive vaccine at local pharmacy or Health Dept. Verbalized acceptance and understanding.  Screening Tests Health Maintenance  Topic Date Due   OPHTHALMOLOGY EXAM  Never done   Zoster Vaccines- Shingrix (1 of 2) Never done   Pneumonia Vaccine 32+ Years old (31) 03/02/2019   COVID-19 Vaccine (3 - Pfizer risk series) 11/15/2019   FOOT EXAM  10/03/2021   HEMOGLOBIN A1C  10/06/2021   INFLUENZA VACCINE  03/03/2022   TETANUS/TDAP  05/15/2026   COLONOSCOPY (Pts 45-40yr Insurance coverage will need to be confirmed)  01/20/2027   Hepatitis C Screening  Completed   HPV VACCINES  Aged Out    Health Maintenance  Health Maintenance Due  Topic Date Due   OPHTHALMOLOGY EXAM  Never done   Zoster Vaccines- Shingrix (1 of 2) Never done   Pneumonia Vaccine 70 Years old (357 03/02/2019   COVID-19 Vaccine (3 - Pfizer risk series) 11/15/2019   FOOT EXAM  10/03/2021   HEMOGLOBIN A1C  10/06/2021    Colorectal cancer screening: Type of screening: Colonoscopy. Completed 01/19/2017. Repeat every 10 years  Lung Cancer Screening: (Low Dose CT Chest recommended if Age 70-80years, 30 pack-year currently smoking OR have quit w/in 15years.) does not qualify.   Lung Cancer Screening Referral: no  Additional Screening:  Hepatitis C Screening: does qualify; Completed 05/15/2016  Vision Screening: Recommended annual ophthalmology exams for early detection of glaucoma and other disorders of the eye. Is the patient up to date with their  annual eye exam?  Yes  Who is the provider or what is the name of the office in which the patient attends annual eye exams? Dr. AOrvan SeenIf pt is not established with a provider, would they like to be referred to a provider to establish care? No .   Dental Screening: Recommended annual dental exams for proper oral hygiene  Community  Resource Referral / Chronic Care Management: CRR required this visit?  No   CCM required this visit?  No      Plan:     I have personally reviewed and noted the following in the patient's chart:   Medical and social history Use of alcohol, tobacco or illicit drugs  Current medications and supplements including opioid prescriptions. Patient is not currently taking opioid prescriptions. Functional ability and status Nutritional status Physical activity Advanced directives List of other physicians Hospitalizations, surgeries, and ER visits in previous 12 months Vitals Screenings to include cognitive, depression, and falls Referrals and appointments  In addition, I have reviewed and discussed with patient certain preventive protocols, quality metrics, and best practice recommendations. A written personalized care plan for preventive services as well as general preventive health recommendations were provided to patient.     Kellie Simmering, LPN   [id number]   Nurse Notes: none  Due to this being a virtual visit, the after visit summary with patients personalized plan was offered to patient via mail or my-chart. Patient would like to access on my-chart

## 2021-12-25 ENCOUNTER — Other Ambulatory Visit: Payer: Self-pay | Admitting: Family Medicine

## 2021-12-27 IMAGING — DX DG CHEST 1V PORT
1 series · 1 of 1 positions shown · non-contrast
Comparison: Chest radiograph dated 10/25/2019.

CLINICAL DATA: Chest pain

EXAM:
PORTABLE CHEST 1 VIEW

[chest ap]
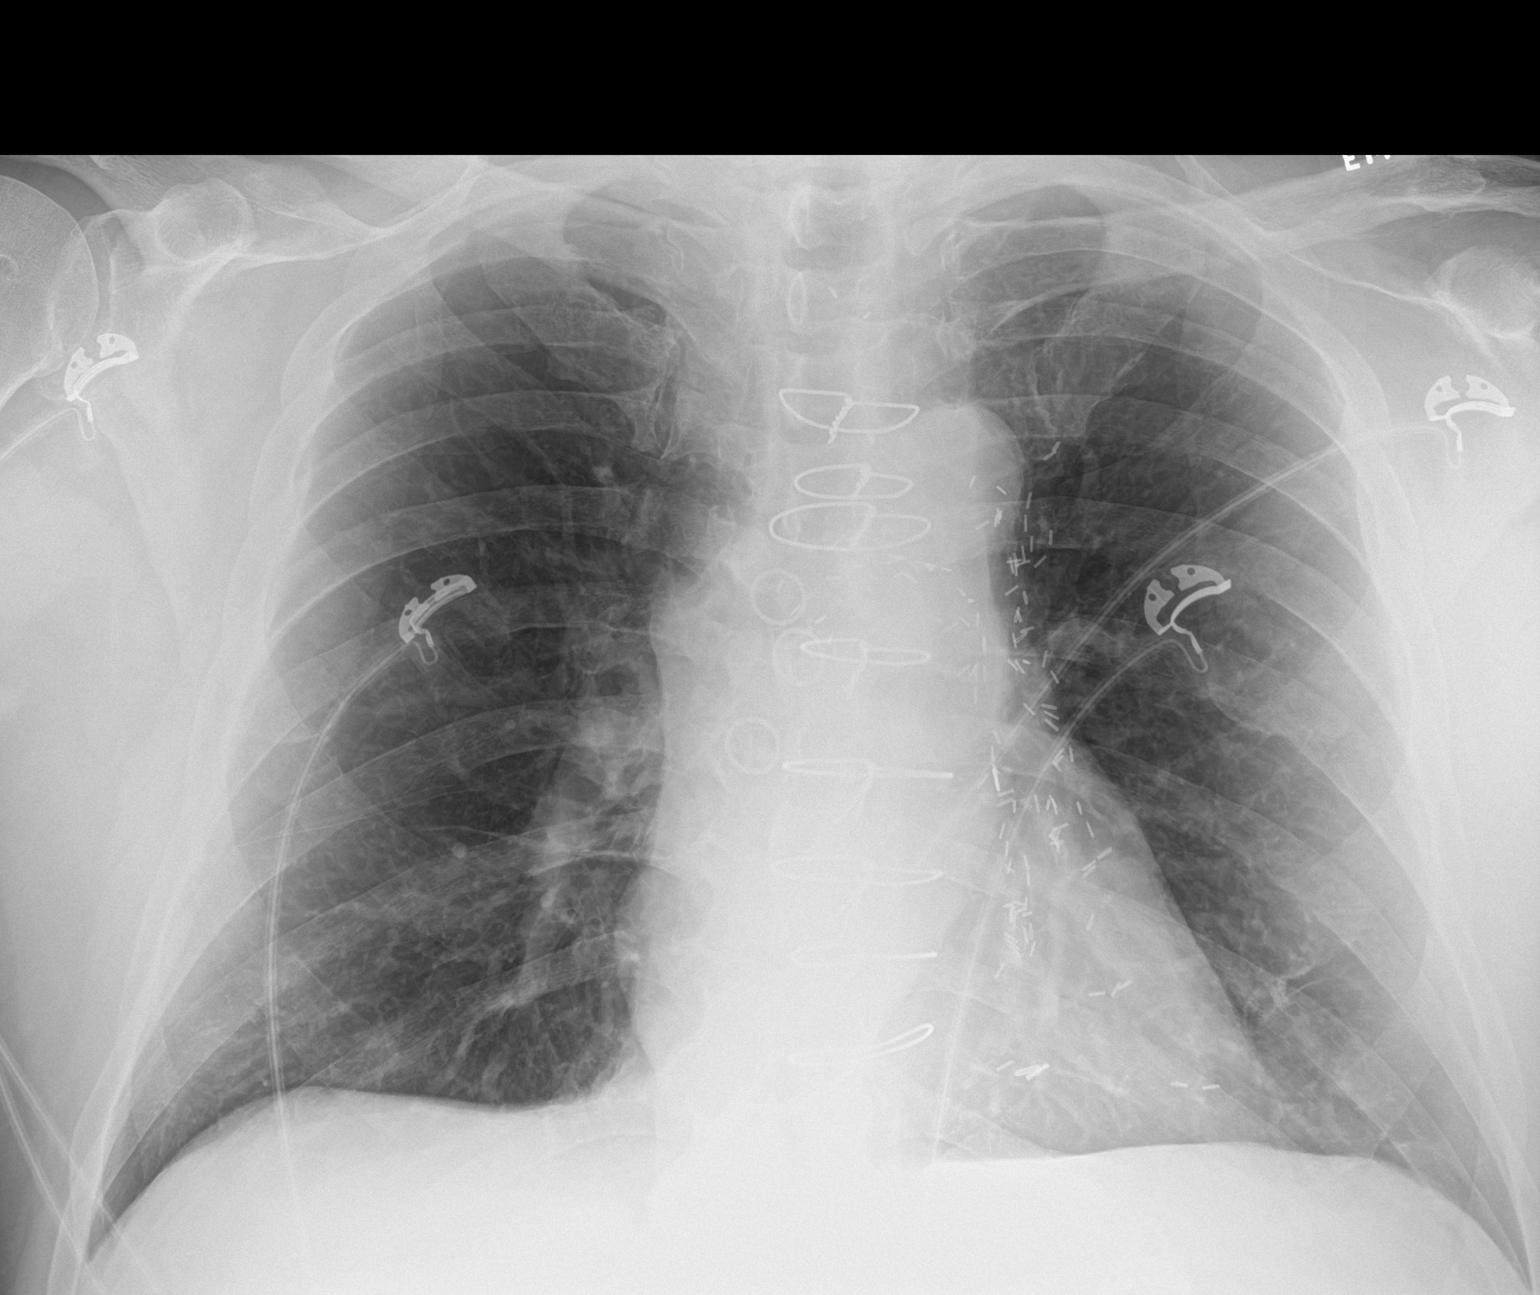

[1 of 1 positions shown; findings below may reference images not displayed]

FINDINGS: The heart size and mediastinal contours are within normal limits.
Both lungs are clear. Median sternotomy wires and mediastinal clips
are noted. Degenerative changes are seen in the spine.
IMPRESSION: No active disease.

## 2021-12-30 NOTE — Progress Notes (Signed)
Cardiology Office Note:    Date:  01/01/2022   ID:  Tyler Fess., DOB 10-10-1950, MRN [id number]  PCP:  Tonia Ghent, MD  Cardiologist:  Sinclair Grooms, MD   Referring MD: Tonia Ghent, MD   Chief Complaint  Patient presents with   Coronary Artery Disease   Hypertension   Hyperlipidemia    History of Present Illness:    Tyler Guerra. is a 71 y.o. male with a hx of CABG 2000 (LIMA--> LAD; SVG RCA, and Free radial to OM), hypertension, DM II, Hyperlipidemia, hypertension, h/o TIA, and NSTEMI 05/2021.Tyler Guerra  He is doing better.  He had his medication this morning.  He denies chest pain, shortness of breath, orthopnea, PND, and claudication.  He turned 71 earlier this spring.  He does not feel he is slowing down although his exertional tolerance is not what it was 5 years ago.  Weight is stable.  States he sleeps okay.  Past Medical History:  Diagnosis Date   Anxiety state 03/16/2016   Aphasia 01/26/2017   Benign localized prostatic hyperplasia with lower urinary tract symptoms (LUTS)    BPV (benign positional vertigo) 11/02/2014   Carotid artery stenosis    PER DUPLEX 10-22-2015  BILATERAL ICA 1-39%   Coronary artery disease    CARDIOLOGIST-  DR Daneen Schick--  VISIT EVERY OTHER YEAR-- LOV 2015   Coronary atherosclerosis 07/19/2010   Qualifier: Diagnosis of  By: [name] MD, Graham     Depression 01/26/2017   Diabetes mellitus without complication ([place]) 9/92/4268   GERD 07/19/2010   Qualifier: Diagnosis of  By: [name] MD, Phillip Heal     GERD (gastroesophageal reflux disease)    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   Gout    Gout 07/18/2010   Qualifier: Diagnosis of  By: [name] MD, Phillip Heal     H/O eye injury    chronic changes to left eye after injury   Hand weakness 03/02/2018   Hearing loss 04/21/2013   History of TIA (transient ischemic attack)    03/ 2017  no residual after brief episode loss peripheral vision   HOH (hard of hearing)    Hyperglycemia 05/17/2016    Hyperlipemia    Hyperlipidemia 07/18/2010   Qualifier: Diagnosis of  By: [name] MD, Graham     Hypertension    HYPERTENSION, BENIGN ESSENTIAL 07/18/2010   Qualifier: Diagnosis of  By: [name] MD, Graham     Hypokalemia 01/26/2017   Knee pain 03/13/2019   Left shoulder pain 09/04/2018   Pneumothorax 10/11/2019   RIGHT    Prostate cancer ([place]) UROLOGIST-  DR GRAPEY/  ONCOLOGIST-  DR MANNING   dx 2015--- Stage T1c, Gleason 3+4, PSA 4.03, vol 44cc   PVC's (premature ventricular contractions)    Rash and nonspecific skin eruption 10/14/2015   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   S/P CABG x 05 Dec 1998   Skin lesion 10/14/2015   TIA (transient ischemic attack) 10/14/2015   Ventral hernia 10/26/2011   Wears glasses     Past Surgical History:  Procedure Laterality Date   [place]  12-22-2001   dr Daneen Schick   severe native vessel disease mLAD 60-70%,  total occulsion pCFX  and pRCA/  widely patent saphenous vein , free radial , and LIMA grafts/  minminal lv dysfunction, ef 60%   CHEST TUBE INSERTION Right 10/11/2019   CORONARY ARTERY BYPASS GRAFT  May 2000  LIMA to LAD,  SVG to PDA and Diagonal, Free radial graft to OM   CORONARY STENT INTERVENTION N/A 05/26/2021   Procedure: CORONARY STENT INTERVENTION;  Surgeon: Early Osmond, MD;  Location: [facility];  Service: Cardiovascular;  Laterality: N/A;   Exericse treadmill test  last one 01-03-2014  dr Daneen Schick   normal exercise tolerance w/ hypertensive repsonse,  no ischemic EKG changes, appropriate HR response & recovery ([facility] TM score 9;  Low Risk , PVC's w/ exertion)   LEFT HEART CATH AND CORS/GRAFTS ANGIOGRAPHY N/A 05/26/2021   Procedure: LEFT HEART CATH AND CORS/GRAFTS ANGIOGRAPHY;  Surgeon: Early Osmond, MD;  Location: [facility];  Service: Cardiovascular;  Laterality: N/A;   PROSTATE BIOPSY     RADIOACTIVE SEED IMPLANT N/A 01/13/2016   Procedure: RADIOACTIVE  SEED IMPLANT/BRACHYTHERAPY IMPLANT;  Surgeon: Rana Snare, MD;  Location: [facility];  Service: Urology;  Laterality: N/A;    Current Medications: Current Meds  Medication Sig   allopurinol (ZYLOPRIM) 300 MG tablet TAKE 1 TABLET DAILY   aspirin EC 81 MG tablet Take 1 tablet (81 mg total) by mouth daily.   atorvastatin (LIPITOR) 80 MG tablet Take 1 tablet (80 mg total) by mouth daily.   Cholecalciferol (VITAMIN D PO) Take 1 capsule by mouth daily.   clopidogrel (PLAVIX) 75 MG tablet Take 1 tablet (75 mg total) by mouth daily with breakfast.   escitalopram (LEXAPRO) 5 MG tablet TAKE 1 TABLET DAILY   losartan-hydrochlorothiazide (HYZAAR) 50-12.5 MG tablet Take 1 tablet by mouth daily.   metoprolol tartrate (LOPRESSOR) 25 MG tablet Take 0.5 tablets (12.5 mg total) by mouth 2 (two) times daily.   Misc Natural Products (BLACK CHERRY CONCENTRATE PO) Take 1 tablet by mouth at bedtime.   Multiple Vitamins-Minerals (OCUVITE PO) Take 1 capsule by mouth daily.   nitroGLYCERIN (NITROSTAT) 0.4 MG SL tablet Place 1 tablet (0.4 mg total) under the tongue every 5 (five) minutes as needed for chest pain.   pantoprazole (PROTONIX) 40 MG tablet Take 1 tablet (40 mg total) by mouth daily.   Propylene Glycol (SYSTANE BALANCE) 0.6 % SOLN Place 1 drop into both eyes daily as needed (dry eyes).   sodium chloride (OCEAN) 0.65 % SOLN nasal spray Place 1 spray into both nostrils as needed for congestion.   tamsulosin (FLOMAX) 0.4 MG CAPS capsule TAKE 1 CAPSULE TWICE A DAY     Allergies:   Patient has no known allergies.   Social History   Socioeconomic History   Marital status: Married    Spouse name: Not on file   Number of children: 2   Years of education: Not on file   Highest education level: Not on file  Occupational History   Occupation: Press photographer REP    Employer: [employer] [place]    Comment: [employer], travelling 3-4 nights a week  Tobacco Use   Smoking status: Never    Smokeless tobacco: Never  Vaping Use   Vaping Use: Never used  Substance and Sexual Activity   Alcohol use: No   Drug use: No   Sexual activity: Yes  Other Topics Concern   Not on file  Social History Narrative   Married 1974   2 daughters and 2 grandkids   Retired 2018.   Social Determinants of Health   Financial Resource Strain: Low Risk    Difficulty of Paying Living Expenses: Not hard at all  Food Insecurity: No Food Insecurity   Worried About Running  Out of Food in the Last Year: Never true   Ran Out of Food in the Last Year: Never true  Transportation Needs: No Transportation Needs   Lack of Transportation (Medical): No   Lack of Transportation (Non-Medical): No  Physical Activity: Inactive   Days of Exercise per Week: 0 days   Minutes of Exercise per Session: 0 min  Stress: No Stress Concern Present   Feeling of Stress : Not at all  Social Connections: Not on file     Family History: The patient's family history includes Cancer in his father, paternal uncle, and sister; Dementia in his mother; Heart disease in his father; Prostate cancer in his father; Stroke in his father. There is no history of Colon cancer.  ROS:   Please see the history of present illness.    No claudication.  Compliant with medication.  No dyspnea.  All other systems reviewed and are negative.  EKGs/Labs/Other Studies Reviewed:    The following studies were reviewed today: CARDIAC CATH 2022: Diagnostic Dominance: Right Intervention    EKG:  EKG not performed  Recent Labs: 05/24/2021: ALT 23 05/27/2021: Hemoglobin 12.6; Platelets 161 10/22/2021: BUN 20; Creatinine, Ser 1.11; Potassium 3.9; Sodium 142  Recent Lipid Panel    Component Value Date/Time   CHOL 126 10/01/2021 0900   TRIG 95 10/01/2021 0900   HDL 44 10/01/2021 0900   CHOLHDL 2.9 10/01/2021 0900   CHOLHDL 3.1 05/25/2021 0251   VLDL 20 05/25/2021 0251   LDLCALC 64 10/01/2021 0900   LDLDIRECT 97.0 10/01/2020 0916     Physical Exam:    VS:  BP 108/60   Pulse 67   Ht '5\' 10"'$  (1.778 m)   Wt 219 lb 6.4 oz (99.5 kg)   SpO2 97%   BMI 31.48 kg/m     Wt Readings from Last 3 Encounters:  01/01/22 219 lb 6.4 oz (99.5 kg)  12/19/21 213 lb (96.6 kg)  10/01/21 220 lb 3.2 oz (99.9 kg)     GEN: Healthy appearing.. No acute distress HEENT: Normal NECK: No JVD. LYMPHATICS: No lymphadenopathy CARDIAC: No murmur. RRR no gallop, or edema. VASCULAR:  Normal Pulses. No bruits. RESPIRATORY:  Clear to auscultation without rales, wheezing or rhonchi  ABDOMEN: Soft, non-tender, non-distended, No pulsatile mass, MUSCULOSKELETAL: No deformity  SKIN: Warm and dry NEUROLOGIC:  Alert and oriented x 3 PSYCHIATRIC:  Normal affect   ASSESSMENT:    1. Coronary artery disease involving coronary bypass graft of native heart without angina pectoris   2. Essential hypertension   3. Hyperlipidemia, unspecified hyperlipidemia type   4. Diabetes mellitus without complication ([place])   5. TIA (transient ischemic attack)    PLAN:    In order of problems listed above:  Secondary prevention reviewed. Blood pressure is much better controlled. Continue high intensity statin therapy. Consider SGLT2 at some point in the future. Secondary prevention.  Overall education and awareness concerning primary/secondary risk prevention was discussed in detail: LDL less than 70, hemoglobin A1c less than 7, blood pressure target less than 130/80 mmHg, >150 minutes of moderate aerobic activity per week, avoidance of smoking, weight control (via diet and exercise), and continued surveillance/management of/for obstructive sleep apnea.    Medication Adjustments/L.abs and Tests Ordered: Current medicines are reviewed at length with the patient today.  Concerns regarding medicines are outlined above.  No orders of the defined types were placed in this encounter.  No orders of the defined types were placed in this encounter.   Patient  Instructions  Medication Instructions:  Your physician recommends that you continue on your current medications as directed. Please refer to the Current Medication list given to you today.  *If you need a refill on your cardiac medications before your next appointment, please call your pharmacy*  Lab Work: NONE  Testing/Procedures: NONE  Follow-Up: At [facility], you and your health needs are our priority.  As part of our continuing mission to provide you with exceptional heart care, we have created designated Provider Care Teams.  These Care Teams include your primary Cardiologist (physician) and Advanced Practice Providers (APPs -  Physician Assistants and Nurse Practitioners) who all work together to provide you with the care you need, when you need it.  Your next appointment:   1 year(s)  The format for your next appointment:   In Person  Provider:   Sinclair Grooms, MD {   Important Information About Sugar         Signed, Sinclair Grooms, MD  01/01/2022 12:24 PM    [place]

## 2022-01-01 ENCOUNTER — Encounter: Payer: Self-pay | Admitting: Interventional Cardiology

## 2022-01-01 ENCOUNTER — Ambulatory Visit (INDEPENDENT_AMBULATORY_CARE_PROVIDER_SITE_OTHER): Payer: Medicare Other | Admitting: Interventional Cardiology

## 2022-01-01 VITALS — BP 108/60 | HR 67 | Ht 70.0 in | Wt 219.4 lb

## 2022-01-01 DIAGNOSIS — E785 Hyperlipidemia, unspecified: Secondary | ICD-10-CM

## 2022-01-01 DIAGNOSIS — E119 Type 2 diabetes mellitus without complications: Secondary | ICD-10-CM

## 2022-01-01 DIAGNOSIS — I10 Essential (primary) hypertension: Secondary | ICD-10-CM | POA: Diagnosis not present

## 2022-01-01 DIAGNOSIS — I25810 Atherosclerosis of coronary artery bypass graft(s) without angina pectoris: Secondary | ICD-10-CM | POA: Diagnosis not present

## 2022-01-01 DIAGNOSIS — G459 Transient cerebral ischemic attack, unspecified: Secondary | ICD-10-CM | POA: Diagnosis not present

## 2022-01-01 NOTE — Patient Instructions (Signed)

## 2022-03-04 ENCOUNTER — Ambulatory Visit: Payer: Medicare Other | Admitting: Family

## 2022-03-04 ENCOUNTER — Telehealth: Payer: Self-pay | Admitting: Family Medicine

## 2022-03-04 ENCOUNTER — Encounter: Payer: Self-pay | Admitting: Family Medicine

## 2022-03-04 ENCOUNTER — Ambulatory Visit (INDEPENDENT_AMBULATORY_CARE_PROVIDER_SITE_OTHER): Payer: Medicare Other | Admitting: Nurse Practitioner

## 2022-03-04 ENCOUNTER — Ambulatory Visit (INDEPENDENT_AMBULATORY_CARE_PROVIDER_SITE_OTHER)
Admission: RE | Admit: 2022-03-04 | Discharge: 2022-03-04 | Disposition: A | Discharge status: Home / Self Care | Payer: Medicare Other | Source: Ambulatory Visit | Attending: Nurse Practitioner | Admitting: Nurse Practitioner

## 2022-03-04 VITALS — BP 104/60 | HR 63 | Temp 97.5°F | Resp 10 | Ht 69.0 in | Wt 217.5 lb

## 2022-03-04 DIAGNOSIS — K5900 Constipation, unspecified: Secondary | ICD-10-CM | POA: Diagnosis not present

## 2022-03-04 NOTE — Progress Notes (Signed)
   Acute Office Visit  Subjective:     Patient ID: Tyler Guerra., male    DOB: 1951-04-12, 71 y.o.   MRN: [id number]  Chief Complaint  Patient presents with   Constipation    Had a soft bowel movement on 02/28/22 and had some pain in the LLQ before bowel. No bowel or pain since then. Usually has bowel movement once daily. Did notice few days prior to 02/28/22 bowel amount was smaller.     Constipation Pertinent negatives include no abdominal pain, fever, nausea or vomiting.   Patient is in today for Constipatoin  States that he had a soft BM on 02/28/2022 and then has not had any BM since. States that he has had this happen before. States that he will sit in the monrnig but no movement  Patient does ride a bike did a 3 miler today. States that he has up'ed fluid. Still passing gas. States that he has taken 2 stool sofners on Thursday and Friday one pill. Every other day olive Patient's last colonoscopy was in 2018 did review that in office.  Did show some diverticulosis.  Review of Systems  Constitutional:  Negative for chills and fever.  Gastrointestinal:  Positive for constipation. Negative for abdominal pain, nausea and vomiting.        Objective:    BP 104/60   Pulse 63   Temp (!) 97.5 F (36.4 C)   Resp 10   Ht '5\' 9"'$  (1.753 m)   Wt 217 lb 8 oz (98.7 kg)   SpO2 98%   BMI 32.12 kg/m    Physical Exam Constitutional:      Appearance: Normal appearance.  Cardiovascular:     Rate and Rhythm: Normal rate and regular rhythm.     Heart sounds: Normal heart sounds.  Pulmonary:     Effort: Pulmonary effort is normal.     Breath sounds: Normal breath sounds.  Abdominal:     General: Bowel sounds are normal. There is no distension.     Palpations: There is no mass.     Tenderness: There is no abdominal tenderness.     Hernia: No hernia is present.  Neurological:     Mental Status: He is alert.     No results found for any visits on 03/04/22.       Assessment & Plan:   Problem List Items Addressed This Visit       Other   Constipation - Primary    Likely constipation.  Did give patient recommendations in regards to mixed Ameeth laxative versus MiraLAX.  We will obtain x-ray of abdomen, pending result.  Exam benign in office follow-up if no improvement      Relevant Orders   DG Abd 2 Views    No orders of the defined types were placed in this encounter.   Return if symptoms worsen or fail to improve.  Romilda Garret, NP

## 2022-03-04 NOTE — Telephone Encounter (Signed)
The pt was scheduled appt with T Dugal FNP 03/04/22 at 11:20 and then was transferred to access nurse due to abd pain(red word) to make sure disposition would be OK to wait until appt time per front office personnel. Sending to [facility] CMA as Juluis Rainier. Do not have access nurse note available to paste into chart. When available will add to note.

## 2022-03-04 NOTE — Telephone Encounter (Signed)
I spoke with pt; pt said he was confused and waiting for cb. Pt said he was told had appt today at 11:20 but then was told had to speak with nurse first. Pt was transferred to access nurse due to lt abd pain and constipation. Pt said he was on hold and the call dropped and pt was waiting for cb and pt denies cb from anyone per access note. Pt said he has been constipated since 02/28/22. Pt said he has not had abd pain since 02/28/22 when trying to have BM. Pt said no fever, no abd pain and no N&V. Pt said has taken OTC med to have BM and  olive oil without relief. Pt lives in [place] and cannot make appt already scheduled. Pt scheduled appt with Romilda Garret 03/04/22 at 3 PM. (Matt oked appt). Pt said he is in no distress but feels like he needs to have BM. UC & ED precautions given and pt voiced. Sending note to Romilda Garret NP and Anastasiya CMA.

## 2022-03-04 NOTE — Telephone Encounter (Signed)
Noted will evaluate in office 

## 2022-03-04 NOTE — Telephone Encounter (Signed)
Patient called in stating that he has  been constipated for the last 4 days,last bowel movement was on Saturday. He is also experiencing left side abdominal pain. Sent to triage.

## 2022-03-04 NOTE — Assessment & Plan Note (Signed)
Likely constipation.  Did give patient recommendations in regards to mixed Ameeth laxative versus MiraLAX.  We will obtain x-ray of abdomen, pending result.  Exam benign in office follow-up if no improvement

## 2022-03-04 NOTE — Telephone Encounter (Signed)
Looks like patient was scheduled to see Lorelee Cover, NP today at 11:20 am.

## 2022-03-04 NOTE — Telephone Encounter (Signed)
ERROR

## 2022-03-04 NOTE — Telephone Encounter (Signed)
[facility] - Client TELEPHONE ADVICE RECORD AccessNurse Patient Name: ANTHONYMICHAEL MUNDAY Gender: Male DOB: 1950-12-19 Age: 71 Y 2 M 10 D Return Phone Number: [phone] (Primary), [phone] (Secondary) Address: [place]  [address] Client [facility] - Client Client Site [facility] - Day Provider Renford Dills - MD Contact Type Call Who Is Calling Patient / Member / Family / Caregiver Call Type Triage / Clinical Caller Name Fredderick Phenix Relationship To Patient Other Return Phone Number [phone] (Primary) Chief Complaint Abdominal Pain Reason for Call Symptomatic / Request for [place] has a patient on the line who is having abdominal pain and severe constipation. Translation No Disp. Time Eilene Ghazi Time) Disposition Final User 03/04/2022 10:28:18 AM Attempt made - message left Earleen Reaper 03/04/2022 10:35:49 AM Attempt made - message left Earleen Reaper 03/04/2022 10:55:39 AM FINAL ATTEMPT MADE - message left Yes Zorita Pang, RN, Neoma Laming Final Disposition 03/04/2022 10:55:39 AM FINAL ATTEMPT MADE - message left Yes Womble, RN, Teddy Spike

## 2022-03-04 NOTE — Patient Instructions (Addendum)
Nice to see you today. I will be in touch with the xray once I have the results Follow up if no improvement   You can use over the counter Ducolax - this is a stimulant laxative and can cause cramping and diarrhea. Mira lax is another opition that takes 3-5 days to get resultsl

## 2022-03-10 ENCOUNTER — Other Ambulatory Visit: Payer: Self-pay

## 2022-03-10 MED ORDER — CLOPIDOGREL BISULFATE 75 MG PO TABS: 75.0000 mg | ORAL_TABLET | Freq: Every day | ORAL | 3 refills | Status: DC

## 2022-05-14 DIAGNOSIS — Z23 Encounter for immunization: Secondary | ICD-10-CM | POA: Diagnosis not present

## 2022-07-17 ENCOUNTER — Other Ambulatory Visit: Payer: Self-pay | Admitting: Interventional Cardiology

## 2022-09-21 ENCOUNTER — Other Ambulatory Visit: Payer: Self-pay | Admitting: Student

## 2022-10-13 ENCOUNTER — Other Ambulatory Visit: Payer: Self-pay | Admitting: Student

## 2022-10-23 ENCOUNTER — Other Ambulatory Visit: Payer: Self-pay | Admitting: Student

## 2022-11-03 ENCOUNTER — Other Ambulatory Visit: Payer: Self-pay

## 2022-11-03 MED ORDER — LOSARTAN POTASSIUM-HCTZ 50-12.5 MG PO TABS: 1.0000 | ORAL_TABLET | Freq: Every day | ORAL | 0 refills | Status: DC

## 2022-11-26 ENCOUNTER — Encounter: Payer: Self-pay | Admitting: Family Medicine

## 2022-11-26 ENCOUNTER — Encounter: Payer: Self-pay | Admitting: *Deleted

## 2022-11-26 ENCOUNTER — Ambulatory Visit (INDEPENDENT_AMBULATORY_CARE_PROVIDER_SITE_OTHER): Payer: Medicare Other | Admitting: Family Medicine

## 2022-11-26 VITALS — BP 104/62 | HR 72 | Temp 97.9°F | Ht 69.0 in | Wt 221.0 lb

## 2022-11-26 DIAGNOSIS — R2991 Unspecified symptoms and signs involving the musculoskeletal system: Secondary | ICD-10-CM

## 2022-11-26 DIAGNOSIS — E785 Hyperlipidemia, unspecified: Secondary | ICD-10-CM

## 2022-11-26 DIAGNOSIS — E119 Type 2 diabetes mellitus without complications: Secondary | ICD-10-CM

## 2022-11-26 DIAGNOSIS — M109 Gout, unspecified: Secondary | ICD-10-CM

## 2022-11-26 DIAGNOSIS — I10 Essential (primary) hypertension: Secondary | ICD-10-CM | POA: Diagnosis not present

## 2022-11-26 DIAGNOSIS — I2510 Atherosclerotic heart disease of native coronary artery without angina pectoris: Secondary | ICD-10-CM

## 2022-11-26 LAB — LIPID PANEL
Cholesterol: 126 mg/dL (ref 0–200)
HDL: 39.8 mg/dL (ref >39.00)
LDL Cholesterol: 49 mg/dL (ref 0–99)
NonHDL: 85.72
Total CHOL/HDL Ratio: 3
Triglycerides: 184 mg/dL — ABNORMAL HIGH (ref 0.0–149.0)
VLDL: 36.8 mg/dL (ref 0.0–40.0)

## 2022-11-26 LAB — COMPREHENSIVE METABOLIC PANEL
ALT: 13 U/L (ref 0–53)
AST: 14 U/L (ref 0–37)
Albumin: 4.1 g/dL (ref 3.5–5.2)
Alkaline Phosphatase: 115 U/L (ref 39–117)
BUN: 15 mg/dL (ref 6–23)
CO2: 29 mEq/L (ref 19–32)
Calcium: 9.5 mg/dL (ref 8.4–10.5)
Chloride: 105 mEq/L (ref 96–112)
Creatinine, Ser: 1 mg/dL (ref 0.40–1.50)
GFR: 76.03 mL/min (ref >60.00)
Glucose, Bld: 142 mg/dL — ABNORMAL HIGH (ref 70–99)
Potassium: 3.9 mEq/L (ref 3.5–5.1)
Sodium: 141 mEq/L (ref 135–145)
Total Bilirubin: 0.8 mg/dL (ref 0.2–1.2)
Total Protein: 6.8 g/dL (ref 6.0–8.3)

## 2022-11-26 LAB — CBC WITH DIFFERENTIAL/PLATELET
Basophils Absolute: 0 10*3/uL (ref 0.0–0.1)
Basophils Relative: 0.4 % (ref 0.0–3.0)
Eosinophils Absolute: 0.1 10*3/uL (ref 0.0–0.7)
Eosinophils Relative: 1.7 % (ref 0.0–5.0)
HCT: 38 % — ABNORMAL LOW (ref 39.0–52.0)
Hemoglobin: 13 g/dL (ref 13.0–17.0)
Lymphocytes Relative: 29.5 % (ref 12.0–46.0)
Lymphs Abs: 1.7 10*3/uL (ref 0.7–4.0)
MCHC: 34.2 g/dL (ref 30.0–36.0)
MCV: 89.6 fl (ref 78.0–100.0)
Monocytes Absolute: 0.4 10*3/uL (ref 0.1–1.0)
Monocytes Relative: 6.7 % (ref 3.0–12.0)
Neutro Abs: 3.6 10*3/uL (ref 1.4–7.7)
Neutrophils Relative %: 61.7 % (ref 43.0–77.0)
Platelets: 188 10*3/uL (ref 150.0–400.0)
RBC: 4.24 Mil/uL (ref 4.22–5.81)
RDW: 14.9 % (ref 11.5–15.5)
WBC: 5.9 10*3/uL (ref 4.0–10.5)

## 2022-11-26 LAB — HEMOGLOBIN A1C: Hgb A1c MFr Bld: 7 % — ABNORMAL HIGH (ref 4.6–6.5)

## 2022-11-26 LAB — URIC ACID: Uric Acid, Serum: 3.9 mg/dL — ABNORMAL LOW (ref 4.0–7.8)

## 2022-11-26 LAB — TSH: TSH: 1.32 u[IU]/mL (ref 0.35–5.50)

## 2022-11-26 NOTE — Patient Instructions (Addendum)
Ask about seeing Dr. Anne Fu or one of his colleagues.  [facility] at [facility] [address] [place], [place] [address] [phone]  Ask the front for a record release from [facility].   Go to the lab on the way out.   If you have mychart we'll likely use that to update you.    Please call about an eye exam- ask about a diabetic eye exam.    It may be reasonable to see PT if your labs are fine.

## 2022-11-26 NOTE — Progress Notes (Signed)
D/w pt about cardiology follow up.  See AVS.    Requesting records from [facility].  Diabetes:  No meds.   Hypoglycemic episodes:no Hyperglycemic episodes: no Feet problems: some occ tingling in the feet, not constant.   Blood Sugars averaging: not checked.   eye exam within last year: d/w pt.  Referral placed.   See notes on labs.    Hypertension:    Using medication without problems or lightheadedness: yes Chest pain with exertion:no Edema:no Short of breath:no Labs pending.    Gout.  Still on allopurinol.  No recent flares.  Labs pending.  Occ mild discomfort but clearly better on allopurinol.    Knee situation d/w pt.  Started about 1-2 months ago.  Noted after working in the yard or after walking/biking.  No sx with exertion but after he rests he has weakness and wobbliness in the knees.  No falls. No trauma. No bruising.  No knee pain.  No tremor.    Meds, vitals, and allergies reviewed.   ROS: Per HPI unless specifically indicated in ROS section   GEN: nad, alert and oriented HEENT: NCAT NECK: supple w/o LA CV: rrr. PULM: ctab, no inc wob ABD: soft, +bs EXT: no edema SKIN: no acute rash Knee range of motion intact bilaterally without crepitus and he has normal strength at both knees.  No ACL MCL or LCL laxity on exam.  Diabetic foot exam: Normal inspection No skin breakdown No calluses  Normal DP pulses Normal sensation to light touch and monofilament Nails normal

## 2022-11-29 NOTE — Assessment & Plan Note (Signed)
No medications currently.  See notes on labs.  Discussed getting eye exam done.  Referral placed.

## 2022-11-29 NOTE — Assessment & Plan Note (Signed)
Discussed with patient about getting cardiology follow-up.  See after visit summary.

## 2022-11-29 NOTE — Assessment & Plan Note (Signed)
He has normal motor function and an unremarkable exam today.  Discussed potentially seeing physical therapy if his labs are unremarkable.

## 2022-11-29 NOTE — Assessment & Plan Note (Signed)
Continue work on diet and exercise.  Continue losartan hydrochlorothiazide and metoprolol.  See notes on labs.

## 2022-11-29 NOTE — Assessment & Plan Note (Signed)
Continue atorvastatin.  Continue work on diet and exercise.  See notes on labs. 

## 2022-11-29 NOTE — Assessment & Plan Note (Signed)
Continue allopurinol.  See notes on labs. 

## 2022-11-30 ENCOUNTER — Encounter: Payer: Self-pay | Admitting: *Deleted

## 2022-11-30 NOTE — Addendum Note (Signed)
Addended by: Alvina Chou on: 11/30/2022 02:54 PM   Modules accepted: Orders

## 2022-12-01 ENCOUNTER — Telehealth: Payer: Self-pay | Admitting: Family Medicine

## 2022-12-01 NOTE — Telephone Encounter (Signed)
LVM for patient to collect a urine sample. If he needs a closer office to drop off his sample he can go to the [employer] office.

## 2022-12-02 ENCOUNTER — Other Ambulatory Visit: Payer: Medicare Other

## 2022-12-02 DIAGNOSIS — E119 Type 2 diabetes mellitus without complications: Secondary | ICD-10-CM | POA: Diagnosis not present

## 2022-12-02 LAB — MICROALBUMIN / CREATININE URINE RATIO
Creatinine,U: 106.4 mg/dL
Microalb Creat Ratio: 0.7 mg/g (ref 0.0–30.0)
Microalb, Ur: <0.7 mg/dL (ref 0.0–1.9)

## 2022-12-09 ENCOUNTER — Ambulatory Visit: Payer: Medicare Other | Attending: Family Medicine

## 2022-12-09 DIAGNOSIS — R2681 Unsteadiness on feet: Secondary | ICD-10-CM | POA: Insufficient documentation

## 2022-12-09 NOTE — Therapy (Deleted)
OUTPATIENT PHYSICAL THERAPY LOWER EXTREMITY EVALUATION   Patient Name: Tyler Guerra. MRN: [id number] DOB:10-Apr-1951, 72 y.o., male Today's Date: 12/09/2022  END OF SESSION:   Past Medical History:  Diagnosis Date   Anxiety state 03/16/2016   Aphasia 01/26/2017   Benign localized prostatic hyperplasia with lower urinary tract symptoms (LUTS)    BPV (benign positional vertigo) 11/02/2014   Carotid artery stenosis    PER DUPLEX 10-22-2015  BILATERAL ICA 1-39%   Coronary artery disease    CARDIOLOGIST-  DR Verdis Prime--  VISIT EVERY OTHER YEAR-- LOV 2015   Coronary atherosclerosis 07/19/2010   Qualifier: Diagnosis of  By: Para March MD, Graham     Depression 01/26/2017   Diabetes mellitus without complication (HCC) 03/02/2018   GERD 07/19/2010   Qualifier: Diagnosis of  By: Para March MD, Cheree Ditto     GERD (gastroesophageal reflux disease)    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   Gout    Gout 07/18/2010   Qualifier: Diagnosis of  By: Para March MD, Cheree Ditto     H/O eye injury    chronic changes to left eye after injury   Hand weakness 03/02/2018   Hearing loss 04/21/2013   History of TIA (transient ischemic attack)    03/ 2017  no residual after brief episode loss peripheral vision   HOH (hard of hearing)    Hyperglycemia 05/17/2016   Hyperlipemia    Hyperlipidemia 07/18/2010   Qualifier: Diagnosis of  By: Para March MD, Graham     Hypertension    HYPERTENSION, BENIGN ESSENTIAL 07/18/2010   Qualifier: Diagnosis of  By: Para March MD, Graham     Hypokalemia 01/26/2017   Knee pain 03/13/2019   Left shoulder pain 09/04/2018   Pneumothorax 10/11/2019   RIGHT    Prostate cancer (HCC) UROLOGIST-  DR GRAPEY/  ONCOLOGIST-  DR MANNING   dx 2015--- Stage T1c, Gleason 3+4, PSA 4.03, vol 44cc   PVC's (premature ventricular contractions)    Rash and nonspecific skin eruption 10/14/2015   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   S/P CABG x 05 Dec 1998   Skin lesion 10/14/2015   TIA (transient ischemic  attack) 10/14/2015   Ventral hernia 10/26/2011   Wears glasses    Past Surgical History:  Procedure Laterality Date   APPENDECTOMY  1975   CARDIAC CATHETERIZATION  12-22-2001   dr Verdis Prime   severe native vessel disease mLAD 60-70%,  total occulsion pCFX  and pRCA/  widely patent saphenous vein , free radial , and LIMA grafts/  minminal lv dysfunction, ef 60%   CHEST TUBE INSERTION Right 10/11/2019   CORONARY ARTERY BYPASS GRAFT  May 2000   LIMA to LAD,  SVG to PDA and Diagonal, Free radial graft to OM   CORONARY STENT INTERVENTION N/A 05/26/2021   Procedure: CORONARY STENT INTERVENTION;  Surgeon: Orbie Pyo, MD;  Location: [facility];  Service: Cardiovascular;  Laterality: N/A;   Exericse treadmill test  last one 01-03-2014  dr Verdis Prime   normal exercise tolerance w/ hypertensive repsonse,  no ischemic EKG changes, appropriate HR response & recovery (Duke TM score 9;  Low Risk , PVC's w/ exertion)   LEFT HEART CATH AND CORS/GRAFTS ANGIOGRAPHY N/A 05/26/2021   Procedure: LEFT HEART CATH AND CORS/GRAFTS ANGIOGRAPHY;  Surgeon: Orbie Pyo, MD;  Location: [facility];  Service: Cardiovascular;  Laterality: N/A;   PROSTATE BIOPSY     RADIOACTIVE SEED IMPLANT N/A 01/13/2016   Procedure: RADIOACTIVE  SEED IMPLANT/BRACHYTHERAPY IMPLANT;  Surgeon: Barron Alvine, MD;  Location: [facility];  Service: Urology;  Laterality: N/A;   Patient Active Problem List   Diagnosis Date Noted   Constipation 03/04/2022   CAD (coronary artery disease) 05/24/2021   NSTEMI (non-ST elevated myocardial infarction) (HCC) 05/24/2021   Pneumothorax 10/11/2019   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   Knee joint symptom 03/13/2019   Left shoulder pain 09/04/2018   Diabetes mellitus without complication (HCC) 03/02/2018   Anxiety state 03/16/2016   TIA (transient ischemic attack) 10/14/2015   BPV (benign positional vertigo) 11/02/2014   Advance care planning 05/13/2014   S/P CABG  (coronary artery bypass graft) 05/13/2014   Hearing loss 04/21/2013   Ventral hernia 10/26/2011   Medicare annual wellness visit, subsequent 10/26/2011   Prostate cancer (HCC) 10/26/2011   Coronary atherosclerosis 07/19/2010   GERD 07/19/2010   Hyperlipidemia 07/18/2010   Gout 07/18/2010   Hypertension 07/18/2010    PCP: ***  REFERRING PROVIDER: Joaquim Nam, MD   REFERRING DIAG: Knee joint symptom   THERAPY DIAG:  No diagnosis found.  Rationale for Evaluation and Treatment: Rehabilitation  ONSET DATE: ***  SUBJECTIVE:   SUBJECTIVE STATEMENT: ***  PERTINENT HISTORY: *** PAIN:  Are you having pain? {OPRCPAIN:27236}  PRECAUTIONS: {Therapy precautions:24002}  WEIGHT BEARING RESTRICTIONS: {Yes ***/No:24003}  FALLS:  Has patient fallen in last 6 months? {fallsyesno:27318}  LIVING ENVIRONMENT: Lives with: {OPRC lives with:25569::"lives with their family"} Lives in: {Lives in:25570} Stairs: {opstairs:27293} Has following equipment at home: {Assistive devices:23999}  OCCUPATION: ***  PLOF: {PLOF:24004}  PATIENT GOALS: ***  NEXT MD VISIT: ***  OBJECTIVE:   DIAGNOSTIC FINDINGS: ***  PATIENT SURVEYS:  {rehab surveys:24030}  COGNITION: Overall cognitive status: {cognition:24006}     SENSATION: {sensation:27233}  EDEMA:  {edema:24020}  MUSCLE LENGTH: Hamstrings: Right *** deg; Left *** deg Thomas test: Right *** deg; Left *** deg  POSTURE: {posture:25561}  PALPATION: ***  LOWER EXTREMITY ROM:  {AROM/PROM:27142} ROM Right eval Left eval  Hip flexion    Hip extension    Hip abduction    Hip adduction    Hip internal rotation    Hip external rotation    Knee flexion    Knee extension    Ankle dorsiflexion    Ankle plantarflexion    Ankle inversion    Ankle eversion     (Blank rows = not tested)  LOWER EXTREMITY MMT:  MMT Right eval Left eval  Hip flexion    Hip extension    Hip abduction    Hip adduction    Hip internal  rotation    Hip external rotation    Knee flexion    Knee extension    Ankle dorsiflexion    Ankle plantarflexion    Ankle inversion    Ankle eversion     (Blank rows = not tested)  LOWER EXTREMITY SPECIAL TESTS:  {LEspecialtests:26242}  FUNCTIONAL TESTS:  {Functional tests:24029}  GAIT: Distance walked: *** Assistive device utilized: {Assistive devices:23999} Level of assistance: {Levels of assistance:24026} Comments: ***   TODAY'S TREATMENT:  DATE: ***    PATIENT EDUCATION:  Education details: *** Person educated: {Person educated:25204} Education method: {Education Method:25205} Education comprehension: {Education Comprehension:25206}  HOME EXERCISE PROGRAM: ***  ASSESSMENT:  CLINICAL IMPRESSION: Patient is a 72 y.o. male who was seen today for physical therapy evaluation and treatment for ***.   OBJECTIVE IMPAIRMENTS: {opptimpairments:25111}.   ACTIVITY LIMITATIONS: {activitylimitations:27494}  PARTICIPATION LIMITATIONS: {participationrestrictions:25113}  PERSONAL FACTORS: {Personal factors:25162} are also affecting patient's functional outcome.   REHAB POTENTIAL: {rehabpotential:25112}  CLINICAL DECISION MAKING: {clinical decision making:25114}  EVALUATION COMPLEXITY: {Evaluation complexity:25115}   GOALS: Goals reviewed with patient? {yes/no:20286}  SHORT TERM GOALS: Target date: *** *** Baseline: Goal status: {GOALSTATUS:25110}  2.  *** Baseline:  Goal status: {GOALSTATUS:25110}  3.  *** Baseline:  Goal status: {GOALSTATUS:25110}  4.  *** Baseline:  Goal status: {GOALSTATUS:25110}  5.  *** Baseline:  Goal status: {GOALSTATUS:25110}  6.  *** Baseline:  Goal status: {GOALSTATUS:25110}  LONG TERM GOALS: Target date: ***  *** Baseline:  Goal status: {GOALSTATUS:25110}  2.  *** Baseline:  Goal status:  {GOALSTATUS:25110}  3.  *** Baseline:  Goal status: {GOALSTATUS:25110}  4.  *** Baseline:  Goal status: {GOALSTATUS:25110}  5.  *** Baseline:  Goal status: {GOALSTATUS:25110}  6.  *** Baseline:  Goal status: {GOALSTATUS:25110}   PLAN:  PT FREQUENCY: {rehab frequency:25116}  PT DURATION: {rehab duration:25117}  PLANNED INTERVENTIONS: {rehab planned interventions:25118::"Therapeutic exercises","Therapeutic activity","Neuromuscular re-education","Balance training","Gait training","Patient/Family education","Self Care","Joint mobilization"}  PLAN FOR NEXT SESSION: Granville Lewis, PT 12/09/2022, 8:10 AM

## 2022-12-17 ENCOUNTER — Other Ambulatory Visit: Payer: Self-pay

## 2022-12-17 ENCOUNTER — Ambulatory Visit: Payer: Medicare Other | Admitting: Physical Therapy

## 2022-12-17 DIAGNOSIS — R2681 Unsteadiness on feet: Secondary | ICD-10-CM

## 2022-12-17 NOTE — Therapy (Signed)
OUTPATIENT PHYSICAL THERAPY LOWER EXTREMITY EVALUATION   Patient Name: Tyler Guerra. MRN: [id number] DOB:[birth year], 72 y.o., male Today's Date: 12/17/2022  END OF SESSION:  PT End of Session - 12/17/22 1424     Visit Number 1    Number of Visits 1    Date for PT Re-Evaluation 12/17/22    PT Start Time 0147    PT Stop Time 0222    PT Time Calculation (min) 35 min    Activity Tolerance Patient tolerated treatment well    Behavior During Therapy [facility] for tasks assessed/performed             Past Medical History:  Diagnosis Date   Anxiety state 03/16/2016   Aphasia 01/26/2017   Benign localized prostatic hyperplasia with lower urinary tract symptoms (LUTS)    BPV (benign positional vertigo) 11/02/2014   Carotid artery stenosis    PER DUPLEX 10-22-2015  BILATERAL ICA 1-39%   Coronary artery disease    CARDIOLOGIST-  DR Verdis Prime--  VISIT EVERY OTHER YEAR-- LOV 2015   Coronary atherosclerosis 07/19/2010   Qualifier: Diagnosis of  By: Para March MD, Graham     Depression 01/26/2017   Diabetes mellitus without complication (HCC) 03/02/2018   GERD 07/19/2010   Qualifier: Diagnosis of  By: Para March MD, Cheree Ditto     GERD (gastroesophageal reflux disease)    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   Gout    Gout 07/18/2010   Qualifier: Diagnosis of  By: Para March MD, Cheree Ditto     H/O eye injury    chronic changes to left eye after injury   Hand weakness 03/02/2018   Hearing loss 04/21/2013   History of TIA (transient ischemic attack)    03/ 2017  no residual after brief episode loss peripheral vision   HOH (hard of hearing)    Hyperglycemia 05/17/2016   Hyperlipemia    Hyperlipidemia 07/18/2010   Qualifier: Diagnosis of  By: Para March MD, Graham     Hypertension    HYPERTENSION, BENIGN ESSENTIAL 07/18/2010   Qualifier: Diagnosis of  By: Para March MD, Graham     Hypokalemia 01/26/2017   Knee pain 03/13/2019   Left shoulder pain 09/04/2018   Pneumothorax 10/11/2019   RIGHT    Prostate cancer (HCC)  UROLOGIST-  DR GRAPEY/  ONCOLOGIST-  DR MANNING   dx 2015--- Stage T1c, Gleason 3+4, PSA 4.03, vol 44cc   PVC's (premature ventricular contractions)    Rash and nonspecific skin eruption 10/14/2015   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   S/P CABG x 05 Dec 1998   Skin lesion 10/14/2015   TIA (transient ischemic attack) 10/14/2015   Ventral hernia 10/26/2011   Wears glasses    Past Surgical History:  Procedure Laterality Date   APPENDECTOMY  1975   CARDIAC CATHETERIZATION  12-22-2001   dr Verdis Prime   severe native vessel disease mLAD 60-70%,  total occulsion pCFX  and pRCA/  widely patent saphenous vein , free radial , and LIMA grafts/  minminal lv dysfunction, ef 60%   CHEST TUBE INSERTION Right 10/11/2019   CORONARY ARTERY BYPASS GRAFT  May 2000   LIMA to LAD,  SVG to PDA and Diagonal, Free radial graft to OM   CORONARY STENT INTERVENTION N/A 05/26/2021   Procedure: CORONARY STENT INTERVENTION;  Surgeon: Orbie Pyo, MD;  Location: [facility];  Service: Cardiovascular;  Laterality: N/A;   Exericse treadmill test  last one 01-03-2014  dr Verdis Prime  normal exercise tolerance w/ hypertensive repsonse,  no ischemic EKG changes, appropriate HR response & recovery (Duke TM score 9;  Low Risk , PVC's w/ exertion)   LEFT HEART CATH AND CORS/GRAFTS ANGIOGRAPHY N/A 05/26/2021   Procedure: LEFT HEART CATH AND CORS/GRAFTS ANGIOGRAPHY;  Surgeon: Orbie Pyo, MD;  Location: [facility];  Service: Cardiovascular;  Laterality: N/A;   PROSTATE BIOPSY     RADIOACTIVE SEED IMPLANT N/A 01/13/2016   Procedure: RADIOACTIVE SEED IMPLANT/BRACHYTHERAPY IMPLANT;  Surgeon: Barron Alvine, MD;  Location: [facility];  Service: Urology;  Laterality: N/A;   Patient Active Problem List   Diagnosis Date Noted   Constipation 03/04/2022   CAD (coronary artery disease) 05/24/2021   NSTEMI (non-ST elevated myocardial infarction) (HCC) 05/24/2021   Pneumothorax  10/11/2019   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   Knee joint symptom 03/13/2019   Left shoulder pain 09/04/2018   Diabetes mellitus without complication (HCC) 03/02/2018   Anxiety state 03/16/2016   TIA (transient ischemic attack) 10/14/2015   BPV (benign positional vertigo) 11/02/2014   Advance care planning 05/13/2014   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   Hearing loss 04/21/2013   Ventral hernia 10/26/2011   Medicare annual wellness visit, subsequent 10/26/2011   Prostate cancer (HCC) 10/26/2011   Coronary atherosclerosis 07/19/2010   GERD 07/19/2010   Hyperlipidemia 07/18/2010   Gout 07/18/2010   Hypertension 07/18/2010    PCP: Crawford Givens MD  REFERRING PROVIDER: Crawford Givens MD  REFERRING DIAG: Knee joint symptom.  THERAPY DIAG:  Unsteadiness on feet - Plan: PT plan of care cert/re-cert  Rationale for Evaluation and Treatment: Rehabilitation  ONSET DATE: ~ a month and a half ago.  SUBJECTIVE:   SUBJECTIVE STATEMENT: The patient presents to the clinic with c/o of a loss of left LE control and knee giving way on occasions.  He states his symptoms can on for no apparent reason.  He reports his left knee will become very shaky such that he is not able to walk.  He has had some falls and is now using a walker for safety.  He has also noted a significant drop in his energy level and stamina compared to last year.  He states his left LE will move erratically at times.  He also added that he may have had an incest bite as he had a scab in the back left knee recently.  He has no c/o low back pain or injuries reported.  He has no left knee pain.  He states today is a "good day."  He was very active prior to the onset of these symptoms.   PERTINENT HISTORY: See above. PAIN:  Are you having pain? No  PRECAUTIONS: Fall  WEIGHT BEARING RESTRICTIONS:  no.  FALLS:  Has patient fallen in last 6 months? Yes. Number of falls "A few."  Now using a walker which is highly  recommend.  LIVING ENVIRONMENT: Lives with: lives with their spouse Lives in: House/apartment Has following equipment at home:  Standard walker.  OCCUPATION: Retired.  PLOF: Independent  PATIENT GOALS: Find out what is wrong.    OBJECTIVE:   POSTURE: No Significant postural limitations  PALPATION: No palpable pain.  LOWER EXTREMITY ROM:  WNL.  LOWER EXTREMITY MMT:  Normal LE strength.  LOWER EXTREMITY SPECIAL TESTS:  (-) SLR testing. Left Patellar reflex remarkable for hyperreflexia.   FUNCTIONAL TESTS:  5 times sit to stand: 11 seconds. Timed up and go (TUG): 14 seconds. (-) Romberg.  GAIT: Normal gait today with a standard walker.    ASSESSMENT:  CLINICAL IMPRESSION: The patient presents to [facility] with transient left LE symptoms that include his left knee giving way, shakiness and uncoordinated movement that cane result in him not being able to walk.  His left knee giving way has resulted in falls, therefore, he is using a walker now which is highly recommended.  At the time of his evaluation today (wife present) he is having a "good day."  He has no pain complaints and no history of lumbar injuries.  His special testing was negative today and he did very well with functional testing.   Left Patellar reflex remarkable for hyperreflexia.  LE strength is normal.  Per discussion with patient and wife it is recommended he have a consultation with a Neurologist.    OBJECTIVE IMPAIRMENTS: Abnormal gait, decreased activity tolerance, and difficulty walking.   ACTIVITY LIMITATIONS: Activity limits based on "flare-ups."  PARTICIPATION LIMITATIONS: Participation limits based on "flare-ups."  REHAB POTENTIAL: Fair minus.  CLINICAL DECISION MAKING: Evolving/moderate complexity  EVALUATION COMPLEXITY: Moderate   GOALS:  LONG TERM GOALS:   **Recommend Neuro consult prior to possibly participating in PT. PLAN: Evaluation.   PLANNED INTERVENTIONS:   PLAN FOR NEXT  SESSION:  **Recommend Neuro consult prior to possibly participating in PT per discussion with patient and wife**   Krithik Mapel, [place], PT 12/17/2022, 2:55 PM

## 2022-12-18 ENCOUNTER — Other Ambulatory Visit: Payer: Self-pay | Admitting: Family Medicine

## 2022-12-18 ENCOUNTER — Encounter: Payer: Self-pay | Admitting: Neurology

## 2022-12-18 ENCOUNTER — Telehealth: Payer: Self-pay | Admitting: Family Medicine

## 2022-12-18 DIAGNOSIS — R269 Unspecified abnormalities of gait and mobility: Secondary | ICD-10-CM

## 2022-12-18 NOTE — Telephone Encounter (Signed)
Per PT note: PLAN FOR NEXT SESSION: **Recommend Neuro consult prior to possibly participating in PT per discussion with patient and wife**  ============= Please check with patient about the above.  I put in the order for neuro consult. Thanks.

## 2022-12-18 NOTE — Telephone Encounter (Signed)
Patient has appt with Neurology on 01/19/23

## 2022-12-18 NOTE — Progress Notes (Signed)
  Physician Documentation Your signature is required to indicate approval of the treatment plan as stated above. By signing this report, you are approving the plan of care. Please sign and either send electronically or print and fax the signed copy to the number below. If you approve with modifications, please indicate those in the space provided.  Physician Signature: Crawford Givens  Date:___05/17/24  Time:__7:58 AM

## 2022-12-21 ENCOUNTER — Emergency Department (HOSPITAL_COMMUNITY): Payer: Medicare Other

## 2022-12-21 ENCOUNTER — Observation Stay (HOSPITAL_COMMUNITY)
Admission: EM | Admit: 2022-12-21 | Discharge: 2022-12-22 | Disposition: A | Discharge status: Home / Self Care | Payer: Medicare Other | Attending: Internal Medicine | Admitting: Internal Medicine

## 2022-12-21 ENCOUNTER — Encounter (HOSPITAL_COMMUNITY): Payer: Self-pay

## 2022-12-21 ENCOUNTER — Telehealth: Payer: Self-pay | Admitting: Family Medicine

## 2022-12-21 ENCOUNTER — Other Ambulatory Visit: Payer: Self-pay

## 2022-12-21 DIAGNOSIS — R29898 Other symptoms and signs involving the musculoskeletal system: Secondary | ICD-10-CM | POA: Diagnosis not present

## 2022-12-21 DIAGNOSIS — E876 Hypokalemia: Secondary | ICD-10-CM | POA: Insufficient documentation

## 2022-12-21 DIAGNOSIS — I2510 Atherosclerotic heart disease of native coronary artery without angina pectoris: Secondary | ICD-10-CM | POA: Diagnosis not present

## 2022-12-21 DIAGNOSIS — R2689 Other abnormalities of gait and mobility: Secondary | ICD-10-CM | POA: Diagnosis not present

## 2022-12-21 DIAGNOSIS — Z79899 Other long term (current) drug therapy: Secondary | ICD-10-CM | POA: Diagnosis not present

## 2022-12-21 DIAGNOSIS — I63421 Cerebral infarction due to embolism of right anterior cerebral artery: Principal | ICD-10-CM | POA: Insufficient documentation

## 2022-12-21 DIAGNOSIS — R29818 Other symptoms and signs involving the nervous system: Secondary | ICD-10-CM | POA: Diagnosis not present

## 2022-12-21 DIAGNOSIS — Z7409 Other reduced mobility: Secondary | ICD-10-CM | POA: Diagnosis not present

## 2022-12-21 DIAGNOSIS — R531 Weakness: Secondary | ICD-10-CM | POA: Diagnosis not present

## 2022-12-21 DIAGNOSIS — Z7902 Long term (current) use of antithrombotics/antiplatelets: Secondary | ICD-10-CM | POA: Insufficient documentation

## 2022-12-21 DIAGNOSIS — Z7982 Long term (current) use of aspirin: Secondary | ICD-10-CM | POA: Diagnosis not present

## 2022-12-21 DIAGNOSIS — I10 Essential (primary) hypertension: Secondary | ICD-10-CM | POA: Diagnosis not present

## 2022-12-21 DIAGNOSIS — Z8546 Personal history of malignant neoplasm of prostate: Secondary | ICD-10-CM | POA: Diagnosis not present

## 2022-12-21 DIAGNOSIS — I639 Cerebral infarction, unspecified: Secondary | ICD-10-CM | POA: Diagnosis not present

## 2022-12-21 DIAGNOSIS — Z951 Presence of aortocoronary bypass graft: Secondary | ICD-10-CM | POA: Diagnosis not present

## 2022-12-21 DIAGNOSIS — E1165 Type 2 diabetes mellitus with hyperglycemia: Secondary | ICD-10-CM | POA: Diagnosis not present

## 2022-12-21 DIAGNOSIS — E119 Type 2 diabetes mellitus without complications: Secondary | ICD-10-CM

## 2022-12-21 DIAGNOSIS — F411 Generalized anxiety disorder: Secondary | ICD-10-CM

## 2022-12-21 DIAGNOSIS — Z8673 Personal history of transient ischemic attack (TIA), and cerebral infarction without residual deficits: Secondary | ICD-10-CM | POA: Diagnosis present

## 2022-12-21 LAB — I-STAT CHEM 8, ED
BUN: 27 mg/dL — ABNORMAL HIGH (ref 8–23)
Calcium, Ion: 1.22 mmol/L (ref 1.15–1.40)
Chloride: 103 mmol/L (ref 98–111)
Creatinine, Ser: 1 mg/dL (ref 0.61–1.24)
Glucose, Bld: 168 mg/dL — ABNORMAL HIGH (ref 70–99)
HCT: 39 % (ref 39.0–52.0)
Hemoglobin: 13.3 g/dL (ref 13.0–17.0)
Potassium: 3.9 mmol/L (ref 3.5–5.1)
Sodium: 140 mmol/L (ref 135–145)
TCO2: 28 mmol/L (ref 22–32)

## 2022-12-21 LAB — CBC
HCT: 40.4 % (ref 39.0–52.0)
Hemoglobin: 13.2 g/dL (ref 13.0–17.0)
MCH: 29.8 pg (ref 26.0–34.0)
MCHC: 32.7 g/dL (ref 30.0–36.0)
MCV: 91.2 fL (ref 80.0–100.0)
Platelets: 182 10*3/uL (ref 150–400)
RBC: 4.43 MIL/uL (ref 4.22–5.81)
RDW: 13.2 % (ref 11.5–15.5)
WBC: 6.2 10*3/uL (ref 4.0–10.5)
nRBC: 0 % (ref 0.0–0.2)

## 2022-12-21 LAB — DIFFERENTIAL
Abs Immature Granulocytes: 0.02 10*3/uL (ref 0.00–0.07)
Basophils Absolute: 0 10*3/uL (ref 0.0–0.1)
Basophils Relative: 0 %
Eosinophils Absolute: 0.1 10*3/uL (ref 0.0–0.5)
Eosinophils Relative: 1 %
Immature Granulocytes: 0 %
Lymphocytes Relative: 25 %
Lymphs Abs: 1.6 10*3/uL (ref 0.7–4.0)
Monocytes Absolute: 0.4 10*3/uL (ref 0.1–1.0)
Monocytes Relative: 6 %
Neutro Abs: 4.2 10*3/uL (ref 1.7–7.7)
Neutrophils Relative %: 68 %

## 2022-12-21 LAB — PROTIME-INR
INR: 1.2 (ref 0.8–1.2)
Prothrombin Time: 15.4 seconds — ABNORMAL HIGH (ref 11.4–15.2)

## 2022-12-21 LAB — COMPREHENSIVE METABOLIC PANEL
ALT: 15 U/L (ref 0–44)
AST: 20 U/L (ref 15–41)
Albumin: 3.9 g/dL (ref 3.5–5.0)
Alkaline Phosphatase: 110 U/L (ref 38–126)
Anion gap: 10 (ref 5–15)
BUN: 25 mg/dL — ABNORMAL HIGH (ref 8–23)
CO2: 26 mmol/L (ref 22–32)
Calcium: 9 mg/dL (ref 8.9–10.3)
Chloride: 103 mmol/L (ref 98–111)
Creatinine, Ser: 1.13 mg/dL (ref 0.61–1.24)
GFR, Estimated: >60 mL/min (ref >60)
Glucose, Bld: 169 mg/dL — ABNORMAL HIGH (ref 70–99)
Potassium: 3.7 mmol/L (ref 3.5–5.1)
Sodium: 139 mmol/L (ref 135–145)
Total Bilirubin: 1.5 mg/dL — ABNORMAL HIGH (ref 0.3–1.2)
Total Protein: 7 g/dL (ref 6.5–8.1)

## 2022-12-21 LAB — CBG MONITORING, ED: Glucose-Capillary: 182 mg/dL — ABNORMAL HIGH (ref 70–99)

## 2022-12-21 LAB — ETHANOL: Alcohol, Ethyl (B): <10 mg/dL (ref <10)

## 2022-12-21 LAB — APTT: aPTT: 32 seconds (ref 24–36)

## 2022-12-21 MED ORDER — MIDAZOLAM HCL 2 MG/2ML IJ SOLN
1.0000 mg | Freq: Once | INTRAMUSCULAR | Status: AC
  Administered 2022-12-21: 1 mg via INTRAVENOUS
  Filled 2022-12-21: qty 2

## 2022-12-21 MED ORDER — ACETAMINOPHEN 325 MG PO TABS
650.0000 mg | ORAL_TABLET | Freq: Four times a day (QID) | ORAL | Status: DC | PRN
  Administered 2022-12-21: 650 mg via ORAL
  Filled 2022-12-21: qty 2

## 2022-12-21 MED ORDER — SODIUM CHLORIDE 0.9% FLUSH: 3.0000 mL | Freq: Once | INTRAVENOUS | Status: DC

## 2022-12-21 NOTE — ED Provider Notes (Signed)
[place] EMERGENCY DEPARTMENT AT [facility] Provider Note   CSN: [id number] Arrival date & time: 12/21/22  1538     History  Chief Complaint  Patient presents with   Weakness    Tyler Guerra. is a 72 y.o. male.  Brought in by his family for weakness.  History is given by the patient and his wife.  Patient seems to have some trouble communicating.  According to patient and his wife he has had progressively worsening but intermittent weakness of the left leg that includes buckling at the knee.  He denies back pain, leg pain, upper extremity weakness.  His wife reports that 3 weeks ago he could get around all over the house without any problems.  Now who can take several steps and then his leg begins to buckle and he falls to the floor.  He is sometimes able to get around with a walker but she has to be present to make sure he does not fall.  She also notes that he seems to be confused or has some trouble processing information.  This has been going on for at least the last week.  He saw physical therapist who noted that he needed a stat neuroconsult.   Weakness      Home Medications Prior to Admission medications   Medication Sig Start Date End Date Taking? Authorizing Provider  allopurinol (ZYLOPRIM) 300 MG tablet TAKE 1 TABLET DAILY 12/18/22   Joaquim Nam, MD  aspirin EC 81 MG tablet Take 1 tablet (81 mg total) by mouth daily. 06/01/19   Lyn Records, MD  atorvastatin (LIPITOR) 80 MG tablet TAKE 1 TABLET DAILY 10/26/22   Dyann Kief, PA-C  Cholecalciferol (VITAMIN D PO) Take 1 capsule by mouth daily.    [provider]  clopidogrel (PLAVIX) 75 MG tablet Take 1 tablet (75 mg total) by mouth daily with breakfast. 03/10/22   Lyn Records, MD  escitalopram (LEXAPRO) 5 MG tablet TAKE 1 TABLET DAILY 12/18/22   Joaquim Nam, MD  losartan-hydrochlorothiazide (HYZAAR) 50-12.5 MG tablet Take 1 tablet by mouth daily. 11/03/22   Dyann Kief, PA-C   metoprolol tartrate (LOPRESSOR) 25 MG tablet TAKE ONE-HALF (1/2) TABLET TWICE A DAY 07/17/22   Lyn Records, MD  Misc Natural Products (BLACK CHERRY CONCENTRATE PO) Take 1 tablet by mouth at bedtime.    [provider]  Multiple Vitamins-Minerals (OCUVITE PO) Take 1 capsule by mouth daily.    [provider]  nitroGLYCERIN (NITROSTAT) 0.4 MG SL tablet Place 1 tablet (0.4 mg total) under the tongue every 5 (five) minutes as needed for chest pain. 05/27/21   Marjie Skiff E, PA-C  pantoprazole (PROTONIX) 40 MG tablet TAKE 1 TABLET DAILY 09/22/22   Marjie Skiff E, PA-C  Propylene Glycol (SYSTANE BALANCE) 0.6 % SOLN Place 1 drop into both eyes daily as needed (dry eyes).    [provider]  sodium chloride (OCEAN) 0.65 % SOLN nasal spray Place 1 spray into both nostrils as needed for congestion.    [provider]  tamsulosin (FLOMAX) 0.4 MG CAPS capsule TAKE 1 CAPSULE TWICE A DAY 12/18/22   Joaquim Nam, MD      Allergies    Patient has no known allergies.    Review of Systems   Review of Systems  Neurological:  Positive for weakness.    Physical Exam Updated Vital Signs BP 129/86   Pulse 78   Temp 98.6 F (  37 C) (Oral)   Resp 13   Ht 5\' 9"  (1.753 m)   Wt 99.8 kg   SpO2 96%   BMI 32.49 kg/m  Physical Exam Vitals and nursing note reviewed.  Constitutional:      General: He is not in acute distress.    Appearance: He is well-developed. He is not diaphoretic.     Comments: Fidgity.   HENT:     Head: Normocephalic and atraumatic.  Eyes:     General: No scleral icterus.    Conjunctiva/sclera: Conjunctivae normal.  Cardiovascular:     Rate and Rhythm: Normal rate and regular rhythm.     Heart sounds: Normal heart sounds.  Pulmonary:     Effort: Pulmonary effort is normal. No respiratory distress.     Breath sounds: Normal breath sounds.  Abdominal:     Palpations: Abdomen is soft.     Tenderness: There is no abdominal  tenderness.  Musculoskeletal:     Cervical back: Normal range of motion and neck supple.  Skin:    General: Skin is warm and dry.  Neurological:     Mental Status: He is alert.     GCS: GCS eye subscore is 4. GCS verbal subscore is 5. GCS motor subscore is 6.     Cranial Nerves: Cranial nerves 2-12 are intact.     Sensory: Sensation is intact.     Motor: Weakness present.     Coordination: Coordination is intact.     Gait: Gait normal.     Deep Tendon Reflexes:     Reflex Scores:      Patellar reflexes are 2+ on the right side and 0 on the left side.    Comments: Is difficult to ascertain if the patient has confusion or if he is having difficulty processing language and following commands.   Weakness of the left leg with dorsiflexion, leg extension and abduction.  Psychiatric:        Behavior: Behavior normal.     ED Results / Procedures / Treatments   Labs (all labs ordered are listed, but only abnormal results are displayed) Labs Reviewed  PROTIME-INR - Abnormal; Notable for the following components:      Result Value   Prothrombin Time 15.4 (*)    All other components within normal limits  COMPREHENSIVE METABOLIC PANEL - Abnormal; Notable for the following components:   Glucose, Bld 169 (*)    BUN 25 (*)    Total Bilirubin 1.5 (*)    All other components within normal limits  I-STAT CHEM 8, ED - Abnormal; Notable for the following components:   BUN 27 (*)    Glucose, Bld 168 (*)    All other components within normal limits  CBG MONITORING, ED - Abnormal; Notable for the following components:   Glucose-Capillary 182 (*)    All other components within normal limits  APTT  CBC  DIFFERENTIAL  ETHANOL    EKG EKG Interpretation  Date/Time:  Monday Dec 21 2022 15:53:07 EDT Ventricular Rate:  91 PR Interval:  158 QRS Duration: 94 QT Interval:  368 QTC Calculation: 452 R Axis:   50 Text Interpretation: Normal sinus rhythm Nonspecific ST and T wave abnormality  Abnormal ECG When compared with ECG of 27-May-2021 05:56, PREVIOUS ECG IS PRESENT Confirmed by Kristine Royal [phone]) on 12/21/2022 11:23:33 PM  Radiology MR BRAIN WO CONTRAST  Result Date: 12/21/2022 CLINICAL DATA:  Acute neurologic deficit; left-sided weakness EXAM: MRI HEAD WITHOUT CONTRAST TECHNIQUE: Multiplanar, multiecho  pulse sequences of the brain and surrounding structures were obtained without intravenous contrast. COMPARISON:  01/27/2017 FINDINGS: Brain: Intermediate sized area of acute ischemia within the right anterior cerebral artery territory. No acute or chronic hemorrhage. There is multifocal hyperintense T2-weighted signal within the white matter. Generalized volume loss. Old right basal ganglia small vessel infarct. The midline structures are normal. Vascular: Major flow voids are preserved. Skull and upper cervical spine: Normal calvarium and skull base. Visualized upper cervical spine and soft tissues are normal. Sinuses/Orbits:No paranasal sinus fluid levels or advanced mucosal thickening. No mastoid or middle ear effusion. Normal orbits. IMPRESSION: Intermediate sized area of acute ischemia within the right anterior cerebral artery territory. No hemorrhage or mass effect. Electronically Signed   By: Deatra Robinson M.D.   On: 12/21/2022 23:15   CT HEAD WO CONTRAST  Result Date: 12/21/2022 CLINICAL DATA:  Neuro deficit.  Acute stroke suspected. EXAM: CT HEAD WITHOUT CONTRAST TECHNIQUE: Contiguous axial images were obtained from the base of the skull through the vertex without intravenous contrast. RADIATION DOSE REDUCTION: This exam was performed according to the departmental dose-optimization program which includes automated exposure control, adjustment of the mA and/or kV according to patient size and/or use of iterative reconstruction technique. COMPARISON:  01/26/2017 FINDINGS: Brain: There is significant central and cortical atrophy, consistent with small vessel disease and similar  to prior study. There is no intra or extra-axial fluid collection or mass lesion. The basilar cisterns and ventricles have a normal appearance. There is no CT evidence for acute infarction or hemorrhage. There is interval but remote infarction of the RIGHT external capsule. There is no intra or extra-axial fluid collection or mass lesion. The basilar cisterns and ventricles have a normal appearance. There is no CT evidence for acute infarction or hemorrhage. Vascular: There is atherosclerotic calcification of the internal carotid arteries. No hyperdense vessels. Skull: Normal. Negative for fracture or focal lesion. Sinuses/Orbits: No acute finding. Other: None. IMPRESSION: 1. Atrophy and small vessel disease. 2. Interval but remote infarction of the RIGHT external capsule. 3. No evidence for acute intracranial abnormality. Electronically Signed   By: Norva Pavlov M.D.   On: 12/21/2022 18:18    Procedures .Critical Care  Performed by: Arthor Captain, PA-C Authorized by: Arthor Captain, PA-C   Critical care provider statement:    Critical care time (minutes):  60   Critical care time was exclusive of:  Separately billable procedures and treating other patients   Critical care was necessary to treat or prevent imminent or life-threatening deterioration of the following conditions:  CNS failure or compromise   Critical care was time spent personally by me on the following activities:  Development of treatment plan with patient or surrogate, discussions with consultants, evaluation of patient's response to treatment, examination of patient, ordering and review of laboratory studies, ordering and review of radiographic studies, ordering and performing treatments and interventions, pulse oximetry, re-evaluation of patient's condition, review of old charts and obtaining history from patient or surrogate     Medications Ordered in ED Medications  sodium chloride flush (NS) 0.9 % injection 3 mL (3 mLs  Intravenous Not Given 12/21/22 2054)  acetaminophen (TYLENOL) tablet 650 mg (650 mg Oral Given 12/21/22 2325)  midazolam (VERSED) injection 1 mg (1 mg Intravenous Given 12/21/22 2228)    ED Course/ Medical Decision Making/ A&P  Medical Decision Making Amount and/or Complexity of Data Reviewed Radiology: ordered.  Risk OTC drugs. Prescription drug management. Decision regarding hospitalization.   This patient presents to the ED with chief complaint(s) of leg weakness  with pertinent past medical history of  has a past medical history of Anxiety state (03/16/2016), Aphasia (01/26/2017), Benign localized prostatic hyperplasia with lower urinary tract symptoms (LUTS), BPV (benign positional vertigo) (11/02/2014), Carotid artery stenosis, Coronary artery disease, Coronary atherosclerosis (07/19/2010), Depression (01/26/2017), Diabetes mellitus without complication (HCC) (03/02/2018), GERD (07/19/2010), GERD (gastroesophageal reflux disease), Golfer's elbow (03/13/2019), Gout, Gout (07/18/2010), H/O eye injury, Hand weakness (03/02/2018), Hearing loss (04/21/2013), History of TIA (transient ischemic attack), HOH (hard of hearing), Hyperglycemia (05/17/2016), Hyperlipemia, Hyperlipidemia (07/18/2010), Hypertension, HYPERTENSION, BENIGN ESSENTIAL (07/18/2010), Hypokalemia (01/26/2017), Knee pain (03/13/2019), Left shoulder pain (09/04/2018), Pneumothorax (10/11/2019), Prostate cancer (HCC) (UROLOGIST-  DR GRAPEY/  ONCOLOGIST-  DR Kathrynn Running), PVC's (premature ventricular contractions), Rash and nonspecific skin eruption (10/14/2015), 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 The complaint involves an extensive differential diagnosis and treatment options and also carries with it a high risk of complications  and morbidity.    The differential diagnosis includes The differential diagnosis of weakness includes but is not limited to neurologic causes (GBS, myasthenia gravis, CVA, MS, ALS, transverse myelitis, spinal cord injury, CVA, botulism, ) and other causes: ACS, Arrhythmia, syncope, orthostatic hypotension, sepsis, hypoglycemia, electrolyte disturbance, hypothyroidism, respiratory failure, symptomatic anemia, dehydration, heat injury, polypharmacy, malignancy.     Additional history obtained: Additional history obtained from family Records reviewed previous emr documents  Reassessment and review (also see workup area): Lab Tests: I Ordered, and personally interpreted labs.  The pertinent results include:   I have reviewed the patient's labs.  He has mild hyperglycemia.  No other acute findings. Imaging Studies: I ordered and independently visualized and interpreted the following imaging CT scan of the head and MRI of the brain   which showed acute CVA The interpretation of the imaging was limited to assessing for emergent pathology, for which purpose it was ordered.  Consultations Obtained: I requested consultation with the admitting physician Dr. Margo Aye and consultant Dr. Derry Lory , and discussed  findings as well as pertinent plan - they recommend: admission  Medicines ordered and prescription drug management: I ordered the following medications midazolam and Tylenol for headache and anxiety of MRI    Reevaluation of the patient after these medicines showed that the patient    improved  Social Determinants of Health: Patient's  strong family support   increases the complexity of managing their presentation  Cardiac Monitoring: The patient was maintained on a cardiac monitor.  I personally viewed and interpreted the cardiac monitor which showed an underlying rhythm of:  sinus rhythm  Complexity of problems addressed: Patient's presentation is most consistent with  acute presentation  with potential threat to life or bodily function During patient's assessment  Disposition: After consideration of the diagnostic results and the patient's response to treatment,  I feel that the patent would benefit from admission for acute cva .    Final Clinical Impression(s) / ED Diagnoses Final diagnoses:  Acute CVA (cerebrovascular accident) [facility])    Rx / DC Orders ED Discharge Orders     None         Arthor Captain, PA-C 12/22/22 1007    Wynetta Fines, MD 12/29/22 0830

## 2022-12-21 NOTE — Telephone Encounter (Signed)
Please triage patient about recent changes in his condition, since the last OV.  Please let me know and I'll work on it. Thanks.

## 2022-12-21 NOTE — Telephone Encounter (Signed)
Patient [name] called in and stated that patient has an appointment with the neurologist on June 18. She stated that he is basically bed ridden and can't walk right now. She was wanting to know if there is away that Dr. Para March could help get him seen sooner by the neurologist or if they can be referred to another one. Please advise. Thank you!

## 2022-12-21 NOTE — ED Notes (Signed)
Pt arrives to room from the lobby via wheelchair at this time

## 2022-12-21 NOTE — Telephone Encounter (Signed)
I spoke with pts wife; [name] said since pt was seen 11/26/22 he is gradually worsening. Pt tries to go to PT on 12/17/22; after walking from PT to car with walker pt could not stand on his own; grandson helped pt inside of home. On 12/18/22 pt was trying to walk with walker from bed to bathroom and pt fell (does not think he hit his head) bruise on lt leg. pt said lt leg gave way, pt broke out in cold sweat and "was out of it for short time." pt's wife said pt has weakness of left leg and lt hand is slightly weaker than right hand. When pt is sitting or trying to walk pt leans to the left. Pt does not have H/A., fever,CP, SOB and No N&V, pt has not seen blood in urine or bowel. Pt does not have diarrhea but experiencing some constipation. Pt is still eating OK. Pt s wife said sometimes after pt falls it is like he is not clear thinking for short period of time. Pt says he had dizziness every time he gets up and complains of weakness of lt leg and lt leg gives way. Pts wife said he is basically bed ridden. Pt has not fell since 12/18/22.pts wife said she was thinking about taking pt to ED to see why left leg gives way and pt is weaker on lt side. Pt has neurology appt 01/19/23. Pts wife said grandson can yet pt in car and they are going to [facility] ED. Sending note to DR Para March and [employer] pool.

## 2022-12-21 NOTE — ED Provider Triage Note (Signed)
Emergency Medicine Provider Triage Evaluation Note  Tyler Guerra. , a 72 y.o. male  was evaluated in triage.  Patient presenting today with concern for weakness.  Has been having multiple falls over the past couple of months.  Feels as though his legs are wobbly and giving out from under him.  No saddle anesthesia, bowel/bladder dysfunction, fevers, chills or IVDU.  Reports his doctor believes that he has had several TIAs.  Wife says that he has been leaning to 1 side.  No slurred speech or facial droop  Review of Systems  Positive:  Negative:   Physical Exam  BP 109/79 (BP Location: Left Arm)   Pulse 91   Temp 98.6 F (37 C) (Oral)   Resp 16   Ht 5\' 9"  (1.753 m)   Wt 99.8 kg   SpO2 96%   BMI 32.49 kg/m  Gen:   Awake, no distress   Resp:  Normal effort  MSK:   Moves extremities without difficulty  Other:  5 out of 5 knee extension strength on the right side about 4 out of 5 on the left.  No facial droop or aphasia.  Medical Decision Making  Medically screening exam initiated at 4:32 PM.  Appropriate orders placed.  Tyler Guerra. was informed that the remainder of the evaluation will be completed by another provider, this initial triage assessment does not replace that evaluation, and the importance of remaining in the ED until their evaluation is complete.     Saddie Benders, [place] 12/21/22 1633

## 2022-12-21 NOTE — Telephone Encounter (Signed)
No urgent referral was placed; he was given the first available appt with Neurology. Do you want to try another office?

## 2022-12-21 NOTE — ED Triage Notes (Signed)
Pt c/o increased generalized weakness over past month; hx stent placement last year; family endorses some occ confusion noted; L sided weakness noted; pt a and o x 4 at present; denies pain

## 2022-12-21 NOTE — ED Notes (Signed)
Patient transported to MRI 

## 2022-12-22 ENCOUNTER — Inpatient Hospital Stay (HOSPITAL_COMMUNITY): Payer: Medicare Other

## 2022-12-22 ENCOUNTER — Inpatient Hospital Stay (HOSPITAL_BASED_OUTPATIENT_CLINIC_OR_DEPARTMENT_OTHER): Payer: Medicare Other

## 2022-12-22 ENCOUNTER — Encounter (HOSPITAL_COMMUNITY)
Admission: EM | Disposition: A | Discharge status: Home / Self Care | Payer: Self-pay | Source: Home / Self Care | Attending: Emergency Medicine

## 2022-12-22 ENCOUNTER — Other Ambulatory Visit (HOSPITAL_COMMUNITY): Payer: Self-pay

## 2022-12-22 DIAGNOSIS — I6523 Occlusion and stenosis of bilateral carotid arteries: Secondary | ICD-10-CM | POA: Diagnosis not present

## 2022-12-22 DIAGNOSIS — E119 Type 2 diabetes mellitus without complications: Secondary | ICD-10-CM

## 2022-12-22 DIAGNOSIS — Z8673 Personal history of transient ischemic attack (TIA), and cerebral infarction without residual deficits: Secondary | ICD-10-CM | POA: Diagnosis present

## 2022-12-22 DIAGNOSIS — I639 Cerebral infarction, unspecified: Secondary | ICD-10-CM | POA: Diagnosis not present

## 2022-12-22 DIAGNOSIS — I6389 Other cerebral infarction: Secondary | ICD-10-CM | POA: Diagnosis not present

## 2022-12-22 DIAGNOSIS — I63421 Cerebral infarction due to embolism of right anterior cerebral artery: Secondary | ICD-10-CM | POA: Diagnosis not present

## 2022-12-22 DIAGNOSIS — E785 Hyperlipidemia, unspecified: Secondary | ICD-10-CM

## 2022-12-22 HISTORY — PX: LOOP RECORDER INSERTION: EP1214

## 2022-12-22 LAB — COMPREHENSIVE METABOLIC PANEL
ALT: 15 U/L (ref 0–44)
AST: 18 U/L (ref 15–41)
Albumin: 3.5 g/dL (ref 3.5–5.0)
Alkaline Phosphatase: 101 U/L (ref 38–126)
Anion gap: 9 (ref 5–15)
BUN: 22 mg/dL (ref 8–23)
CO2: 25 mmol/L (ref 22–32)
Calcium: 8.9 mg/dL (ref 8.9–10.3)
Chloride: 104 mmol/L (ref 98–111)
Creatinine, Ser: 1.01 mg/dL (ref 0.61–1.24)
GFR, Estimated: >60 mL/min (ref >60)
Glucose, Bld: 132 mg/dL — ABNORMAL HIGH (ref 70–99)
Potassium: 3.4 mmol/L — ABNORMAL LOW (ref 3.5–5.1)
Sodium: 138 mmol/L (ref 135–145)
Total Bilirubin: 1.1 mg/dL (ref 0.3–1.2)
Total Protein: 6.5 g/dL (ref 6.5–8.1)

## 2022-12-22 LAB — CBC WITH DIFFERENTIAL/PLATELET
Abs Immature Granulocytes: 0.02 10*3/uL (ref 0.00–0.07)
Basophils Absolute: 0 10*3/uL (ref 0.0–0.1)
Basophils Relative: 0 %
Eosinophils Absolute: 0.1 10*3/uL (ref 0.0–0.5)
Eosinophils Relative: 2 %
HCT: 34.6 % — ABNORMAL LOW (ref 39.0–52.0)
Hemoglobin: 11.7 g/dL — ABNORMAL LOW (ref 13.0–17.0)
Immature Granulocytes: 0 %
Lymphocytes Relative: 27 %
Lymphs Abs: 1.7 10*3/uL (ref 0.7–4.0)
MCH: 30 pg (ref 26.0–34.0)
MCHC: 33.8 g/dL (ref 30.0–36.0)
MCV: 88.7 fL (ref 80.0–100.0)
Monocytes Absolute: 0.4 10*3/uL (ref 0.1–1.0)
Monocytes Relative: 7 %
Neutro Abs: 3.8 10*3/uL (ref 1.7–7.7)
Neutrophils Relative %: 64 %
Platelets: 164 10*3/uL (ref 150–400)
RBC: 3.9 MIL/uL — ABNORMAL LOW (ref 4.22–5.81)
RDW: 13.2 % (ref 11.5–15.5)
WBC: 6 10*3/uL (ref 4.0–10.5)
nRBC: 0 % (ref 0.0–0.2)

## 2022-12-22 LAB — ECHOCARDIOGRAM COMPLETE
AR max vel: 1.62 cm2
AV Area VTI: 1.7 cm2
AV Area mean vel: 1.51 cm2
AV Mean grad: 10 mmHg
AV Peak grad: 18.4 mmHg
Ao pk vel: 2.14 m/s
Area-P 1/2: 2.62 cm2
Height: 69 in
P 1/2 time: 333 msec
S' Lateral: 2.8 cm
Weight: 3520 oz

## 2022-12-22 LAB — MAGNESIUM: Magnesium: 2 mg/dL (ref 1.7–2.4)

## 2022-12-22 LAB — HEMOGLOBIN A1C
Hgb A1c MFr Bld: 6.6 % — ABNORMAL HIGH (ref 4.8–5.6)
Mean Plasma Glucose: 142.72 mg/dL

## 2022-12-22 LAB — LIPID PANEL
Cholesterol: 128 mg/dL (ref 0–200)
HDL: 42 mg/dL (ref >40)
LDL Cholesterol: 66 mg/dL (ref 0–99)
Total CHOL/HDL Ratio: 3 RATIO
Triglycerides: 99 mg/dL (ref <150)
VLDL: 20 mg/dL (ref 0–40)

## 2022-12-22 LAB — GLUCOSE, CAPILLARY: Glucose-Capillary: 144 mg/dL — ABNORMAL HIGH (ref 70–99)

## 2022-12-22 LAB — CBG MONITORING, ED
Glucose-Capillary: 141 mg/dL — ABNORMAL HIGH (ref 70–99)
Glucose-Capillary: 110 mg/dL — ABNORMAL HIGH (ref 70–99)
Glucose-Capillary: 163 mg/dL — ABNORMAL HIGH (ref 70–99)

## 2022-12-22 LAB — PHOSPHORUS: Phosphorus: 3.6 mg/dL (ref 2.5–4.6)

## 2022-12-22 SURGERY — LOOP RECORDER INSERTION

## 2022-12-22 MED ORDER — SODIUM CHLORIDE 0.9 % IV BOLUS
500.0000 mL | Freq: Once | INTRAVENOUS | Status: AC
  Administered 2022-12-22: 500 mL via INTRAVENOUS

## 2022-12-22 MED ORDER — LIDOCAINE-EPINEPHRINE 1 %-1:100000 IJ SOLN
INTRAMUSCULAR | Status: AC
  Filled 2022-12-22: qty 1

## 2022-12-22 MED ORDER — TICAGRELOR 90 MG PO TABS
90.0000 mg | ORAL_TABLET | Freq: Two times a day (BID) | ORAL | 0 refills | Status: DC
  Filled 2022-12-22: qty 60, 30d supply, fill #0

## 2022-12-22 MED ORDER — LIDOCAINE-EPINEPHRINE 1 %-1:100000 IJ SOLN
INTRAMUSCULAR | Status: DC | PRN
  Administered 2022-12-22: 20 mL

## 2022-12-22 MED ORDER — PANTOPRAZOLE SODIUM 40 MG PO TBEC
40.0000 mg | DELAYED_RELEASE_TABLET | Freq: Every day | ORAL | Status: DC
  Administered 2022-12-22: 40 mg via ORAL
  Filled 2022-12-22: qty 1

## 2022-12-22 MED ORDER — PROCHLORPERAZINE EDISYLATE 10 MG/2ML IJ SOLN: 5.0000 mg | Freq: Four times a day (QID) | INTRAMUSCULAR | Status: DC | PRN

## 2022-12-22 MED ORDER — ATORVASTATIN CALCIUM 80 MG PO TABS
80.0000 mg | ORAL_TABLET | Freq: Every day | ORAL | Status: DC
  Administered 2022-12-22: 80 mg via ORAL
  Filled 2022-12-22: qty 2

## 2022-12-22 MED ORDER — POLYETHYLENE GLYCOL 3350 17 G PO PACK: 17.0000 g | PACK | Freq: Every day | ORAL | Status: DC | PRN

## 2022-12-22 MED ORDER — CLOPIDOGREL BISULFATE 75 MG PO TABS
75.0000 mg | ORAL_TABLET | Freq: Every day | ORAL | Status: DC
  Administered 2022-12-22: 75 mg via ORAL
  Filled 2022-12-22: qty 1

## 2022-12-22 MED ORDER — MELATONIN 3 MG PO TABS: 3.0000 mg | ORAL_TABLET | Freq: Every evening | ORAL | Status: DC | PRN

## 2022-12-22 MED ORDER — INSULIN ASPART 100 UNIT/ML IJ SOLN
0.0000 [IU] | INTRAMUSCULAR | Status: DC
  Administered 2022-12-22 (×2): 1 [IU] via SUBCUTANEOUS
  Administered 2022-12-22: 2 [IU] via SUBCUTANEOUS

## 2022-12-22 MED ORDER — CLOPIDOGREL BISULFATE 75 MG PO TABS
75.0000 mg | ORAL_TABLET | Freq: Every day | ORAL | 0 refills | Status: DC
  Filled 2022-12-22: qty 30, 30d supply, fill #0

## 2022-12-22 MED ORDER — LABETALOL HCL 5 MG/ML IV SOLN: 5.0000 mg | INTRAVENOUS | Status: DC | PRN

## 2022-12-22 MED ORDER — POLYETHYLENE GLYCOL 3350 17 GM/SCOOP PO POWD
17.0000 g | Freq: Every day | ORAL | 0 refills | Status: AC | PRN
  Filled 2022-12-22: qty 238, 14d supply, fill #0

## 2022-12-22 MED ORDER — ASPIRIN 81 MG PO TBEC
81.0000 mg | DELAYED_RELEASE_TABLET | Freq: Every day | ORAL | Status: DC
  Administered 2022-12-22: 81 mg via ORAL
  Filled 2022-12-22: qty 1

## 2022-12-22 MED ORDER — ESCITALOPRAM OXALATE 10 MG PO TABS
5.0000 mg | ORAL_TABLET | Freq: Every day | ORAL | Status: DC
  Administered 2022-12-22: 5 mg via ORAL
  Filled 2022-12-22: qty 1

## 2022-12-22 MED ORDER — TICAGRELOR 90 MG PO TABS
90.0000 mg | ORAL_TABLET | Freq: Two times a day (BID) | ORAL | 0 refills | Status: DC
  Filled 2022-12-22 (×2): qty 60, 30d supply, fill #0

## 2022-12-22 MED ORDER — ENOXAPARIN SODIUM 40 MG/0.4ML IJ SOSY
40.0000 mg | PREFILLED_SYRINGE | Freq: Every day | INTRAMUSCULAR | Status: DC
  Administered 2022-12-22: 40 mg via SUBCUTANEOUS
  Filled 2022-12-22: qty 0.4

## 2022-12-22 MED ORDER — IOHEXOL 350 MG/ML SOLN
75.0000 mL | Freq: Once | INTRAVENOUS | Status: AC | PRN
  Administered 2022-12-22: 75 mL via INTRAVENOUS

## 2022-12-22 MED ORDER — TAMSULOSIN HCL 0.4 MG PO CAPS
0.4000 mg | ORAL_CAPSULE | Freq: Two times a day (BID) | ORAL | Status: DC
  Administered 2022-12-22: 0.4 mg via ORAL
  Filled 2022-12-22: qty 1

## 2022-12-22 SURGICAL SUPPLY — 2 items
PACK LOOP INSERTION (CUSTOM PROCEDURE TRAY) ×1 IMPLANT
SYSTEM MONITOR REVEAL LINQ II (Prosthesis & Implant Heart) IMPLANT

## 2022-12-22 NOTE — ED Notes (Signed)
This RN assisted patient to stand and use urinal. Patient did not require assistance while upright.

## 2022-12-22 NOTE — Progress Notes (Signed)
Echocardiogram 2D Echocardiogram has been performed.  Lucendia Herrlich 12/22/2022, 10:38 AM

## 2022-12-22 NOTE — Telephone Encounter (Signed)
See inpatient notes

## 2022-12-22 NOTE — Evaluation (Signed)
Physical Therapy Evaluation Patient Details Name: Tyler Guerra. MRN: [id number] DOB: [birth year] Today's Date: 12/22/2022  History of Present Illness  Pt is 72 yo male who presented on 12/21/22 with intermittent L leg weakness past 3 weeks.  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 Pt with hx including but not limited to CAD, HTN, TIA, anxiety, BPPV, HLD, gout, prostate CA, CABG and stents.   Clinical Impression  Pt admitted with above diagnosis. At baseline, pt very independent and no use of AD.  He lives with family and has support (wife reports her and grandson are there to assist). Today, pt's L leg strength testing well in sitting.  He did have mild decreased in coordination.  Also, noting pt taking increased time to respond and difficulty with higher level task - some limitations due to [facility] but family reports he is not at baseline cognition. Pt requiring min guard and ambulated 60' with RW; however, requiring mod/max cues to continue with gait and after 30' with decreased responsiveness and pallor, so returned to room with chair follow. He does have drop in BP in standing (suspect lower during ambulation but unable to test until back in room). Pt and family hopeful to return home at d/c and feel comfortable providing level of assist required.  They would prefer outpt PT over HHPT as they have assist for transportation and are familiar with the therapist.  Educated on safety. Expect pt to progress well.  Pt currently with functional limitations due to the deficits listed below (see PT Problem List). Pt will benefit from acute skilled PT to increase their independence and safety with mobility to allow discharge.       Blood Pressures: Supine 131/97 Sitting 112/79 (asymptomatic of drop) Sitting 3 mins: 102/84 (asymptomatic of drop, within parameters) Standing: 108/86 (asymptomatic) Ambulation had less responsiveness and pallor but unable to take BP in hallway BP upon immediate return to  sitting 108/86  Standing EOB 107/74 (Notified RN and MD)   Recommendations for follow up therapy are one component of a multi-disciplinary discharge planning process, led by the attending physician.  Recommendations may be updated based on patient status, additional functional criteria and insurance authorization.  Follow Up Recommendations       Assistance Recommended at Discharge Frequent or constant Supervision/Assistance  Patient can return home with the following  A little help with walking and/or transfers;A little help with bathing/dressing/bathroom;Assistance with cooking/housework;Help with stairs or ramp for entrance    Equipment Recommendations Rolling walker (2 wheels) (wife to check if theirs is standard or RW)  Recommendations for Other Services       Functional Status Assessment Patient has had a recent decline in their functional status and demonstrates the ability to make significant improvements in function in a reasonable and predictable amount of time.     Precautions / Restrictions Precautions Precautions: Fall Precaution Comments: watch for orthostatic hypotension      Mobility  Bed Mobility Overal bed mobility: Needs Assistance Bed Mobility: Supine to Sit, Sit to Supine     Supine to sit: Min guard Sit to supine: Min guard        Transfers Overall transfer level: Needs assistance Equipment used: Rolling walker (2 wheels) Transfers: Sit to/from Stand Sit to Stand: Min guard           General transfer comment: Min guard for safety; performed x 2    Ambulation/Gait Ambulation/Gait assistance: Min guard Gait Distance (Feet): 60 Feet Assistive device: Rolling  walker (2 wheels) Gait Pattern/deviations: Step-through pattern, Drifts right/left, Decreased stride length Gait velocity: decreased     General Gait Details: Pt with varying levels of speed (but all decreased), needed cues to continue walking (would just stop), mild drift R/L .   After about 30' pt seemed to slow and stop more frequently requiring increased cues, L knee flexing some in stance but not buckling.  He was also had slower/decreased responses and pale in face.  Had pt return to room while talking about his dogs to monitor alertness.  Also, had nurse provide chair follow for safety.  BP was decreased from supine (see comments for vitals)  Stairs            Wheelchair Mobility    Modified Rankin (Stroke Patients Only)       Balance Overall balance assessment: Needs assistance Sitting-balance support: No upper extremity supported Sitting balance-Leahy Scale: Good     Standing balance support: Bilateral upper extremity supported, No upper extremity supported Standing balance-Leahy Scale: Fair Standing balance comment: Could static stand without AD but RW to ambulate                             Pertinent Vitals/Pain Pain Assessment Pain Assessment: No/denies pain    Home Living Family/patient expects to be discharged to:: Private residence Living Arrangements: Spouse/significant other Available Help at Discharge: Family;Available 24 hours/day Type of Home: House Home Access: Level entry       Home Layout: Two level;Able to live on main level with bedroom/bathroom Home Equipment: Rollator (4 wheels);Standard Walker Additional Comments: Reports difficulty with L leg weakness 3 weeks , intermittent, but progressively worsening. He reports he went to PCP and had outpt PT referral and eval    Prior Function Prior Level of Function : Independent/Modified Independent;Driving             Mobility Comments: could ambulate without difficulty; no AD       Hand Dominance        Extremity/Trunk Assessment   Upper Extremity Assessment Upper Extremity Assessment: Overall WFL for tasks assessed (ROM WFL, MMT 5/5 (slighltly weaker L than R grip), coordination wtih finger tip to tip and finger to nose normal)    Lower Extremity  Assessment Lower Extremity Assessment: RLE deficits/detail;LLE deficits/detail RLE Deficits / Details: ROM WFL; MMT 5/5 LLE Deficits / Details: ROM WFL; MMT 5/5 ; coordination: slight decrease in speed heel/shin compared to R    Cervical / Trunk Assessment Cervical / Trunk Assessment: Normal  Communication   Communication: No difficulties  Cognition Arousal/Alertness: Awake/alert Behavior During Therapy: WFL for tasks assessed/performed Overall Cognitive Status: Impaired/Different from baseline                                 General Comments: Some limitations due to [facility] but is slower to respond and had difficulty with higher cognitive task (only able to say 2 months of months in reverse order).  Fidgeting with lines. Daughter and wife present and report different from baseline.        General Comments   Blood Pressures: Supine 131/97 Sitting 112/79 (asymptomatic of drop) Sitting 3 mins: 102/84 (asymptomatic of drop, within parameters) Standing: 108/86 (asymptomatic) Ambulation had less responsiveness and pallor but unable to take BP in hallway BP upon immediate return to sitting 108/86  Standing EOB 107/74 (Notified RN and MD)  Education: Pt and family hopeful of home at d/c.  Educated on safety including having supervision with mobility, use of RW, monitoring blood pressure, return to sitting with any lightheadedness, having places to sit available with ambulation, slow transfers with symptoms monitored.    Exercises     Assessment/Plan    PT Assessment Patient needs continued PT services  PT Problem List Decreased strength;Decreased cognition;Decreased range of motion;Decreased knowledge of use of DME;Decreased activity tolerance;Decreased safety awareness;Decreased balance;Decreased knowledge of precautions;Decreased mobility;Decreased coordination;Cardiopulmonary status limiting activity       PT Treatment Interventions DME instruction;Balance  training;Modalities;Gait training;Neuromuscular re-education;Stair training;Functional mobility training;Therapeutic activities;Therapeutic exercise;Patient/family education;Cognitive remediation    PT Goals (Current goals can be found in the Care Plan section)  Acute Rehab PT Goals Patient Stated Goal: return home with family and his dogs PT Goal Formulation: With patient/family Time For Goal Achievement: 01/05/23 Potential to Achieve Goals: Good    Frequency Min 4X/week     Co-evaluation               AM-PAC PT "6 Clicks" Mobility  Outcome Measure Help needed turning from your back to your side while in a flat bed without using bedrails?: A Little Help needed moving from lying on your back to sitting on the side of a flat bed without using bedrails?: A Little Help needed moving to and from a bed to a chair (including a wheelchair)?: A Little Help needed standing up from a chair using your arms (e.g., wheelchair or bedside chair)?: A Little Help needed to walk in hospital room?: A Little Help needed climbing 3-5 steps with a railing? : A Lot 6 Click Score: 17    End of Session Equipment Utilized During Treatment: Gait belt Activity Tolerance: Patient tolerated treatment well Patient left: in bed;with call bell/phone within reach;with family/visitor present (in ED) Nurse Communication: Mobility status PT Visit Diagnosis: Other abnormalities of gait and mobility (R26.89);Hemiplegia and hemiparesis Hemiplegia - Right/Left: Left Hemiplegia - dominant/non-dominant: Non-dominant Hemiplegia - caused by: Cerebral infarction    Time: 1259-1346 (only charged 2 units as MD in during session) PT Time Calculation (min) (ACUTE ONLY): 47 min   Charges:   PT Evaluation $PT Eval Low Complexity: 1 Low PT Treatments $Gait Training: 8-22 mins        Anise Salvo, PT Acute Rehab [facility] [phone]   Rayetta Humphrey 12/22/2022, 2:31 PM

## 2022-12-22 NOTE — ED Notes (Signed)
Dr. Margo Aye verbally stated to this RN that patient did not need q2 NIH and that q6 would be fine.

## 2022-12-22 NOTE — Care Management Obs Status (Signed)
MEDICARE OBSERVATION STATUS NOTIFICATION   Patient Details  Name: Tyler Guerra. MRN: [id number] Date of Birth: Sep 20, 1950   Medicare Observation Status Notification Given:  Yes    Kermit Balo, RN 12/22/2022, 5:17 PM

## 2022-12-22 NOTE — Discharge Instructions (Signed)
Care After Your Loop Recorder  You have a [employer] Loop Recorder   Monitor your cardiac device site for redness, swelling, and drainage. Call the device clinic at [phone] if you experience these symptoms or fever/chills.  If you notice bleeding from your site, hold firm, but gently pressure with two fingers for 5 minutes. Dried blood on the steri-strips when removing the outer bandage is normal.   Keep the large square bandage on your site for 24 hours and then you may remove it yourself. Keep the steri-strips underneath in place.   You may shower after 72 hours / 3 days from your procedure with the steri-strips in place. They will usually fall off on their own, or may be removed after 10 days. Pat dry.   Avoid lotions, ointments, or perfumes over your incision until it is well-healed.  Please do not submerge in water until your site is completely healed.   Your device is MRI compatible.   Remote monitoring is used to monitor your cardiac device from home. This monitoring is scheduled every month by our office. It allows us to keep an eye on the function of your device to ensure it is working properly.    

## 2022-12-22 NOTE — Discharge Summary (Signed)
Physician Discharge Summary   Patient: Tyler Guerra. MRN: [id number] DOB: February 27, 1951  Admit date:     12/21/2022  Discharge date: 12/22/2022  Discharge Physician: Tyler Merles, DO   PCP: Tyler Nam, MD   Recommendations at discharge:   Follow-up with PCP within 1 to 2 weeks and repeat CBC, CMP, mag, Phos within 1 week Follow-up with cardiology in outpatient setting Follow-up with neurology in 4 to 6 weeks and dual antiplatelet therapy with aspirin and Brilinta  Discharge Diagnoses: Principal Problem:   Acute CVA (cerebrovascular accident) Tyler Guerra)  Resolved Problems:   * No resolved hospital problems. [facility] Course: HPI per Dr. Dow Guerra on 12/22/22 HPI: Tyler Guerra. is a 72 y.o. male with medical history significant for coronary artery disease on DAPT, aspirin, Plavix, high intensity statin Lipitor 80 mg daily, hypertension, TIAs, who presented to [facility] ED from home with intermittent left leg weakness past 3 weeks.  Endorses having trouble with walking and his left leg giving out.  His wife brought him in for further evaluation.   In the ED, workup revealed moderate size right PCA territory infarct, seen on MRI brain.  Seen by neurology/stroke team, recommended admission for stroke workup.  Admitted by Tyler Guerra, hospitalist service to telemetry medical unit under inpatient status.   ED Course: Temperature 98.2.  BP 122/78, pulse 72, respiratory 10, O2 saturation 96% on room air.  Lab studies remarkable for hemoglobin A1c 6.6.  LDL 66.  Serum glucose 168, BUN 27.  **Interim History Patient underwent stroke workup completed this prior to discharge.  Neurology recommended a loop recorder and this was placed and PT OT recommending home health with patient preferring outpatient PT.  Patient was changed to aspirin and Brilinta and prior to discharge orthostatic vital signs were done and he was not orthostatic so he has been deemed stable for discharge and will need to follow-up  with PCP, neurology and cardiology outpatient setting.  Assessment and Plan:  Acute right ACA CVA, seen on MRI brain. -Prior to admission on DAPT aspirin and Plavix as well as high intensity statin Lipitor 80 mg daily, per his wife he has been compliant with his home medications, restarted.   -Admitted for stroke workup and now being changed to aspirin and Brilinta Follow 2D echo and showed "Left ventricular ejection fraction, by estimation, is 55 to 60%. The left ventricle has normal function. The left ventricle demonstrates regional wall motion abnormalities (see scoring diagram/findings for  description). There is mild concentric left ventricular hypertrophy. Left ventricular diastolic parameters are  consistent with Grade I diastolic dysfunction (impaired relaxation). There is mild dyskinesis of the left ventricular, basal inferior wall. There is moderate hypokinesis of the left  ventricular, basal-mid inferoseptal wall.   2. Right ventricular systolic function is normal. The right ventricular size is normal. Tricuspid regurgitation signal is inadequate for assessing PA pressure.   3. The mitral valve is normal in structure. No evidence of mitral valve regurgitation.   4. The aortic valve is tricuspid. There is mild calcification of the aortic valve. There is mild thickening of the aortic valve. Aortic valve regurgitation is trivial. Mild aortic valve stenosis." -LDL 66, goal less than 70 -A1c 6.6, goal less than 7.0. Permissive hypertension treat SBP greater than 220 or DBP greater than 120 PT/OT/speech therapist assessment and they are recommending home health PT and OT but he prefers outpatient PT OT -Fall precautions/aspiration precautions and orthostatics checked prior to discharge and he is  not orthostatic Neuro checks Stroke team following and appreciate further evaluation recommendations and neurology recommending implantable loop recorder prior to discharge and he is now stable for  discharge with outpatient follow-up   Type 2 diabetes with hyperglycemia -Hemoglobin A1c 6.6 -Insulin sliding scale every 4 hours while NPO and then changed to regular scale -CBG Trend: Recent Labs  Lab 12/21/22 1635 12/22/22 0618 12/22/22 0805 12/22/22 1238 12/22/22 1627  GLUCAP 182* 141* 110* 163* 144*    Coronary artery disease -Hold off oral antihypertensives due to permissive HTN but ok to resume at /C -Platelet therapy changed to aspirin and Brilinta twice daily and will continue high intensity statin -Denies any anginal symptoms.   GERD -Resume home regimen   BPH Resume home regimen   Chronic anxiety/depression -Stable -Resume home escitalopram  Hypokalemia -Patient's K+ Level Trend: Recent Labs  Lab 11/26/22 1234 12/21/22 1636 12/21/22 1647 12/22/22 1130  K 3.9 3.7 3.9 3.4*  -Replete with po Kcl 40 MEQ BID  -Continue to Monitor and Replete as Necessary -Repeat CMP within 1 week  Obesity -Complicates overall prognosis and care -Estimated body mass index is 31.38 kg/m as calculated from the following:   Height as of this encounter: 5\' 10"  (1.778 m).   Weight as of this encounter: 99.2 kg.  -Weight Loss and Dietary Counseling given  Consultants: Neurology, EP cardiology Procedures performed: As above Disposition: Home health for today and will go for outpatient PT Diet recommendation:  Carb modified diet DISCHARGE MEDICATION: Allergies as of 12/22/2022   No Known Allergies      Medication List     TAKE these medications    allopurinol 300 MG tablet Commonly known as: ZYLOPRIM TAKE 1 TABLET DAILY   aspirin EC 81 MG tablet Take 1 tablet (81 mg total) by mouth daily.   atorvastatin 80 MG tablet Commonly known as: LIPITOR TAKE 1 TABLET DAILY   BLACK CHERRY CONCENTRATE PO Take 1 tablet by mouth at bedtime.   clopidogrel 75 MG tablet Commonly known as: PLAVIX Take 1 tablet (75 mg total) by mouth daily. Resume After Tyler Guerra is completed  after 30 days Start taking on: January 22, 2023 What changed:  when to take this additional instructions These instructions start on January 22, 2023. If you are unsure what to do until then, ask your doctor or other care provider.   escitalopram 5 MG tablet Commonly known as: LEXAPRO TAKE 1 TABLET DAILY   losartan-hydrochlorothiazide 50-12.5 MG tablet Commonly known as: Hyzaar Take 1 tablet by mouth daily.   metoprolol tartrate 25 MG tablet Commonly known as: LOPRESSOR TAKE ONE-HALF (1/2) TABLET TWICE A DAY What changed: See the new instructions.   nitroGLYCERIN 0.4 MG SL tablet Commonly known as: NITROSTAT Place 1 tablet (0.4 mg total) under the tongue every 5 (five) minutes as needed for chest pain.   OCUVITE PO Take 1 capsule by mouth daily.   pantoprazole 40 MG tablet Commonly known as: PROTONIX TAKE 1 TABLET DAILY   polyethylene glycol powder 17 GM/SCOOP powder Commonly known as: GLYCOLAX/MIRALAX Take 17 g by mouth daily as needed for mild constipation.   sodium chloride 0.65 % Soln nasal spray Commonly known as: OCEAN Place 1 spray into both nostrils as needed for congestion.   Systane Balance 0.6 % Soln Generic drug: Propylene Glycol Place 1 drop into both eyes daily as needed (dry eyes).   tamsulosin 0.4 MG Caps capsule Commonly known as: FLOMAX TAKE 1 CAPSULE TWICE A DAY What changed: when to  take this   ticagrelor 90 MG Tabs tablet Commonly known as: Brilinta Take 1 tablet (90 mg total) by mouth 2 (two) times daily. Take For 4 Weeks total and then switch back to Plavix (clopidogrel).   VITAMIN D PO Take 1 capsule by mouth daily.        Discharge Exam: Filed Weights   12/21/22 1629  Weight: 99.8 kg   Vitals:   12/22/22 1510 12/22/22 1620  BP: 118/85 132/69  Pulse: 67 76  Resp: 16 17  Temp: 99.1 F (37.3 C)   SpO2: 98% 97%   Examination: Physical Exam:  Constitutional: WN/WD obese male no acute distress Respiratory: Diminished to  auscultation bilaterally, no wheezing, rales, rhonchi or crackles. Normal respiratory effort and patient is not tachypenic. No accessory muscle use.  Unlabored breathing Cardiovascular: RRR, no murmurs / rubs / gallops. S1 and S2 auscultated. No extremity edema.  Abdomen: Soft, non-tender, secondary to body habitus bowel sounds positive.  GU: Deferred. Musculoskeletal: No clubbing / cyanosis of digits/nails. No joint deformity upper and lower extremities.  Skin: No rashes, lesions, ulcers on a limited skin evaluation. No induration; Warm and dry.  Neurologic: CN 2-12 grossly intact with no focal deficits. Romberg sign and cerebellar reflexes not assessed.  Psychiatric: Normal judgment and insight. Alert and oriented x 3. Normal mood and appropriate affect.   Condition at discharge: stable  The results of significant diagnostics from this hospitalization (including imaging, microbiology, ancillary and laboratory) are listed below for reference.   Imaging Studies: ECHOCARDIOGRAM COMPLETE  Result Date: 12/22/2022    ECHOCARDIOGRAM REPORT   Patient Name:   Avett Carazo. Date of Exam: 12/22/2022 Medical Rec #:  [id number]          Height:       69.0 in Accession #:    [id number]         Weight:       220.0 lb Date of Birth:  1951/03/27          BSA:          2.151 m Patient Age:    71 years           BP:           107/64 mmHg Patient Gender: M                  HR:           58 bpm. Exam Location:  Inpatient Procedure: 2D Echo, Cardiac Doppler and Color Doppler Indications:    Stroke I63.9  History:        Patient has prior history of Echocardiogram examinations, most                 recent 05/25/2021. CAD and Previous Myocardial Infarction, Prior                 CABG, Stroke and TIA; Risk Factors:Hypertension, Diabetes and                 Dyslipidemia.  Sonographer:    Lucendia Herrlich Referring Phys: [id number] [facility] KHALIQDINA IMPRESSIONS  1. Left ventricular ejection fraction, by estimation, is 55 to  60%. The left ventricle has normal function. The left ventricle demonstrates regional wall motion abnormalities (see scoring diagram/findings for description). There is mild concentric left ventricular hypertrophy. Left ventricular diastolic parameters are consistent with Grade I diastolic dysfunction (impaired relaxation). There is mild dyskinesis of the left ventricular, basal inferior wall. There is moderate  hypokinesis of the left ventricular, basal-mid inferoseptal wall.  2. Right ventricular systolic function is normal. The right ventricular size is normal. Tricuspid regurgitation signal is inadequate for assessing PA pressure.  3. The mitral valve is normal in structure. No evidence of mitral valve regurgitation.  4. The aortic valve is tricuspid. There is mild calcification of the aortic valve. There is mild thickening of the aortic valve. Aortic valve regurgitation is trivial. Mild aortic valve stenosis. Comparison(s): Prior images reviewed side by side. The left ventricular function is unchanged. The left ventricular wall motion abnormality is unchanged. FINDINGS  Left Ventricle: Left ventricular ejection fraction, by estimation, is 55 to 60%. The left ventricle has normal function. The left ventricle demonstrates regional wall motion abnormalities. Mild dyskinesis of the left ventricular, basal inferior wall. Moderate hypokinesis of the left ventricular, basal-mid inferoseptal wall. The left ventricular internal cavity size was normal in size. There is mild concentric left ventricular hypertrophy. Left ventricular diastolic parameters are consistent with Grade I diastolic dysfunction (impaired relaxation). Normal left ventricular filling pressure.  LV Wall Scoring: The basal inferior segment is dyskinetic. The mid inferoseptal segment and basal inferoseptal segment are hypokinetic. The entire anterior wall, entire lateral wall, entire anterior septum, entire apex, and mid and distal inferior wall are  normal. Right Ventricle: The right ventricular size is normal. No increase in right ventricular wall thickness. Right ventricular systolic function is normal. Tricuspid regurgitation signal is inadequate for assessing PA pressure. Left Atrium: Left atrial size was normal in size. Right Atrium: Right atrial size was normal in size. Pericardium: There is no evidence of pericardial effusion. Mitral Valve: The mitral valve is normal in structure. No evidence of mitral valve regurgitation. Tricuspid Valve: The tricuspid valve is normal in structure. Tricuspid valve regurgitation is not demonstrated. Aortic Valve: The aortic valve is tricuspid. There is mild calcification of the aortic valve. There is mild thickening of the aortic valve. Aortic valve regurgitation is trivial. Aortic regurgitation PHT measures 333 msec. Mild aortic stenosis is present. Aortic valve mean gradient measures 10.0 mmHg. Aortic valve peak gradient measures 18.4 mmHg. Aortic valve area, by VTI measures 1.70 cm. Pulmonic Valve: The pulmonic valve was grossly normal. Pulmonic valve regurgitation is not visualized. Aorta: The aortic root and ascending aorta are structurally normal, with no evidence of dilitation. IAS/Shunts: No atrial level shunt detected by color flow Doppler.  LEFT VENTRICLE PLAX 2D LVIDd:         4.50 cm   Diastology LVIDs:         2.80 cm   LV e' medial:    7.83 cm/s LV PW:         1.30 cm   LV E/e' medial:  7.1 LV IVS:        1.30 cm   LV e' lateral:   8.27 cm/s LVOT diam:     2.10 cm   LV E/e' lateral: 6.7 LV SV:         70 LV SV Index:   32 LVOT Area:     3.46 cm  RIGHT VENTRICLE            IVC RV S prime:     6.85 cm/s  IVC diam: 1.80 cm TAPSE (M-mode): 1.4 cm LEFT ATRIUM             Index        RIGHT ATRIUM           Index LA diam:  3.50 cm 1.63 cm/m   RA Area:     16.20 cm LA Vol (A2C):   43.1 ml 20.03 ml/m  RA Volume:   40.40 ml  18.78 ml/m LA Vol (A4C):   44.0 ml 20.45 ml/m LA Biplane Vol: 45.5 ml 21.15  ml/m  AORTIC VALVE                     PULMONIC VALVE AV Area (Vmax):    1.62 cm      PR End Diast Vel: 4.49 msec AV Area (Vmean):   1.51 cm AV Area (VTI):     1.70 cm AV Vmax:           214.25 cm/s AV Vmean:          143.750 cm/s AV VTI:            0.410 m AV Peak Grad:      18.4 mmHg AV Mean Grad:      10.0 mmHg LVOT Vmax:         100.27 cm/s LVOT Vmean:        62.533 cm/s LVOT VTI:          0.201 m LVOT/AV VTI ratio: 0.49 AI PHT:            333 msec  AORTA Ao Root diam: 3.40 cm Ao Asc diam:  3.40 cm MITRAL VALVE MV Area (PHT): 2.62 cm    SHUNTS MV Decel Time: 289 msec    Systemic VTI:  0.20 m MV E velocity: 55.50 cm/s  Systemic Diam: 2.10 cm MV A velocity: 82.80 cm/s MV E/A ratio:  0.67 Mihai Croitoru MD Electronically signed by Thurmon Fair MD Signature Date/Time: 12/22/2022/11:23:45 AM    Final    CT ANGIO HEAD NECK W WO CM  Result Date: 12/22/2022 CLINICAL DATA:  Left-sided weakness EXAM: CT ANGIOGRAPHY HEAD AND NECK WITH AND WITHOUT CONTRAST TECHNIQUE: Multidetector CT imaging of the head and neck was performed using the standard protocol during bolus administration of intravenous contrast. Multiplanar CT image reconstructions and MIPs were obtained to evaluate the vascular anatomy. Carotid stenosis measurements (when applicable) are obtained utilizing NASCET criteria, using the distal internal carotid diameter as the denominator. RADIATION DOSE REDUCTION: This exam was performed according to the departmental dose-optimization program which includes automated exposure control, adjustment of the mA and/or kV according to patient size and/or use of iterative reconstruction technique. CONTRAST:  75mL OMNIPAQUE IOHEXOL 350 MG/ML SOLN COMPARISON:  None Available. FINDINGS: CTA NECK FINDINGS SKELETON: There is no bony spinal canal stenosis. No lytic or blastic lesion. OTHER NECK: Normal pharynx, larynx and major salivary glands. No cervical lymphadenopathy. Unremarkable thyroid gland. UPPER CHEST: No  pneumothorax or pleural effusion. No nodules or masses. AORTIC ARCH: There is calcific atherosclerosis of the aortic arch. There is no aneurysm, dissection or hemodynamically significant stenosis of the visualized portion of the aorta. Conventional 3 vessel aortic branching pattern. The visualized proximal subclavian arteries are widely patent. RIGHT CAROTID SYSTEM: Normal without aneurysm, dissection or stenosis. LEFT CAROTID SYSTEM: Normal without aneurysm, dissection or stenosis. VERTEBRAL ARTERIES: Left dominant configuration. Both origins are clearly patent. There is no dissection, occlusion or flow-limiting stenosis to the skull base (V1-V3 segments). CTA HEAD FINDINGS POSTERIOR CIRCULATION: --Vertebral arteries: Normal V4 segments. --Inferior cerebellar arteries: Normal. --Basilar artery: Normal. --Superior cerebellar arteries: Normal. --Posterior cerebral arteries (PCA): Normal. ANTERIOR CIRCULATION: --Intracranial internal carotid arteries: Atherosclerotic calcification of the internal carotid arteries at the skull base without hemodynamically  significant stenosis. --Anterior cerebral arteries (ACA): Normal. Absent right A1 segment, normal variant --Middle cerebral arteries (MCA): Normal. VENOUS SINUSES: As permitted by contrast timing, patent. ANATOMIC VARIANTS: Fetal origin of the right posterior cerebral artery. Review of the MIP images confirms the above findings. IMPRESSION: No emergent large vessel occlusion or hemodynamically significant stenosis of the head or neck. Aortic atherosclerosis (ICD10-I70.0). Electronically Signed   By: Deatra Robinson M.D.   On: 12/22/2022 02:28   MR BRAIN WO CONTRAST  Result Date: 12/21/2022 CLINICAL DATA:  Acute neurologic deficit; left-sided weakness EXAM: MRI HEAD WITHOUT CONTRAST TECHNIQUE: Multiplanar, multiecho pulse sequences of the brain and surrounding structures were obtained without intravenous contrast. COMPARISON:  01/27/2017 FINDINGS: Brain: Intermediate  sized area of acute ischemia within the right anterior cerebral artery territory. No acute or chronic hemorrhage. There is multifocal hyperintense T2-weighted signal within the white matter. Generalized volume loss. Old right basal ganglia small vessel infarct. The midline structures are normal. Vascular: Major flow voids are preserved. Skull and upper cervical spine: Normal calvarium and skull base. Visualized upper cervical spine and soft tissues are normal. Sinuses/Orbits:No paranasal sinus fluid levels or advanced mucosal thickening. No mastoid or middle ear effusion. Normal orbits. IMPRESSION: Intermediate sized area of acute ischemia within the right anterior cerebral artery territory. No hemorrhage or mass effect. Electronically Signed   By: Deatra Robinson M.D.   On: 12/21/2022 23:15   CT HEAD WO CONTRAST  Result Date: 12/21/2022 CLINICAL DATA:  Neuro deficit.  Acute stroke suspected. EXAM: CT HEAD WITHOUT CONTRAST TECHNIQUE: Contiguous axial images were obtained from the base of the skull through the vertex without intravenous contrast. RADIATION DOSE REDUCTION: This exam was performed according to the departmental dose-optimization program which includes automated exposure control, adjustment of the mA and/or kV according to patient size and/or use of iterative reconstruction technique. COMPARISON:  01/26/2017 FINDINGS: Brain: There is significant central and cortical atrophy, consistent with small vessel disease and similar to prior study. There is no intra or extra-axial fluid collection or mass lesion. The basilar cisterns and ventricles have a normal appearance. There is no CT evidence for acute infarction or hemorrhage. There is interval but remote infarction of the RIGHT external capsule. There is no intra or extra-axial fluid collection or mass lesion. The basilar cisterns and ventricles have a normal appearance. There is no CT evidence for acute infarction or hemorrhage. Vascular: There is  atherosclerotic calcification of the internal carotid arteries. No hyperdense vessels. Skull: Normal. Negative for fracture or focal lesion. Sinuses/Orbits: No acute finding. Other: None. IMPRESSION: 1. Atrophy and small vessel disease. 2. Interval but remote infarction of the RIGHT external capsule. 3. No evidence for acute intracranial abnormality. Electronically Signed   By: Norva Pavlov M.D.   On: 12/21/2022 18:18    Microbiology: Results for orders placed or performed during the hospital encounter of 05/24/21  Resp Panel by RT-PCR (Flu A&B, Covid) Nasopharyngeal Swab     Status: None   Collection Time: 05/24/21  8:16 PM   Specimen: Nasopharyngeal Swab; Nasopharyngeal(NP) swabs in vial transport medium  Result Value Ref Range Status   SARS Coronavirus 2 by RT PCR NEGATIVE NEGATIVE Final    Comment: (NOTE) SARS-CoV-2 target nucleic acids are NOT DETECTED.  The SARS-CoV-2 RNA is generally detectable in upper respiratory specimens during the acute phase of infection. The lowest concentration of SARS-CoV-2 viral copies this assay can detect is 138 copies/mL. A negative result does not preclude SARS-Cov-2 infection and should not be used as  the sole basis for treatment or other patient management decisions. A negative result may occur with  improper specimen collection/handling, submission of specimen other than nasopharyngeal swab, presence of viral mutation(s) within the areas targeted by this assay, and inadequate number of viral copies(<138 copies/mL). A negative result must be combined with clinical observations, patient history, and epidemiological information. The expected result is Negative.  Fact Sheet for Patients:  BloggerCourse.com  Fact Sheet for Healthcare Providers:  SeriousBroker.it  This test is no t yet approved or cleared by the [place] FDA and  has been authorized for detection and/or diagnosis of SARS-CoV-2  by FDA under an Emergency Use Authorization (EUA). This EUA will remain  in effect (meaning this test can be used) for the duration of the COVID-19 declaration under Section 564(b)(1) of the Act, 21 U.S.C.section 360bbb-3(b)(1), unless the authorization is terminated  or revoked sooner.       Influenza A by PCR NEGATIVE NEGATIVE Final   Influenza B by PCR NEGATIVE NEGATIVE Final    Comment: (NOTE) The Xpert Xpress SARS-CoV-2/FLU/RSV plus assay is intended as an aid in the diagnosis of influenza from Nasopharyngeal swab specimens and should not be used as a sole basis for treatment. Nasal washings and aspirates are unacceptable for Xpert Xpress SARS-CoV-2/FLU/RSV testing.  Fact Sheet for Patients: BloggerCourse.com  Fact Sheet for Healthcare Providers: SeriousBroker.it  This test is not yet approved or cleared by the [place] FDA and has been authorized for detection and/or diagnosis of SARS-CoV-2 by FDA under an Emergency Use Authorization (EUA). This EUA will remain in effect (meaning this test can be used) for the duration of the COVID-19 declaration under Section 564(b)(1) of the Act, 21 U.S.C. section 360bbb-3(b)(1), unless the authorization is terminated or revoked.  Performed at [facility], [address], [place], [place] [address]    Labs: CBC: Recent Labs  Lab 12/21/22 1636 12/21/22 1647 12/22/22 1130  WBC 6.2  --  6.0  NEUTROABS 4.2  --  3.8  HGB 13.2 13.3 11.7*  HCT 40.4 39.0 34.6*  MCV 91.2  --  88.7  PLT 182  --  164   Basic Metabolic Panel: Recent Labs  Lab 12/21/22 1636 12/21/22 1647 12/22/22 1130  NA 139 140 138  K 3.7 3.9 3.4*  CL 103 103 104  CO2 26  --  25  GLUCOSE 169* 168* 132*  BUN 25* 27* 22  CREATININE 1.13 1.00 1.01  CALCIUM 9.0  --  8.9  MG  --   --  2.0  PHOS  --   --  3.6   Liver Function Tests: Recent Labs  Lab 12/21/22 1636 12/22/22 1130  AST 20 18  ALT  15 15  ALKPHOS 110 101  BILITOT 1.5* 1.1  PROT 7.0 6.5  ALBUMIN 3.9 3.5   CBG: Recent Labs  Lab 12/21/22 1635 12/22/22 0618 12/22/22 0805 12/22/22 1238  GLUCAP 182* 141* 110* 163*   Discharge time spent: greater than 30 minutes.  Signed: Marguerita Merles, DO [facility] 12/22/2022

## 2022-12-22 NOTE — H&P (Addendum)
History and Physical  Tyler Guerra. ZOX:[id number] DOB: June 22, 1951 DOA: 12/21/2022  Referring physician: Burnis Medin  PCP: Joaquim Nam, MD  Outpatient Specialists: Cardiology Patient coming from: Home.  Chief Complaint: Intermittent leg weakness.  HPI: Tyler Amani. is a 72 y.o. male with medical history significant for coronary artery disease on DAPT, aspirin, Plavix, high intensity statin Lipitor 80 mg daily, hypertension, TIAs, who presented to [facility] ED from home with intermittent left leg weakness past 3 weeks.  Endorses having trouble with walking and his left leg giving out.  His wife brought him in for further evaluation.  In the ED, workup revealed moderate size right PCA territory infarct, seen on MRI brain.  Seen by neurology/stroke team, recommended admission for stroke workup.  Admitted by [facility], hospitalist service to telemetry medical unit under inpatient status.  ED Course: Temperature 98.2.  BP 122/78, pulse 72, respiratory 10, O2 saturation 96% on room air.  Lab studies remarkable for hemoglobin A1c 6.6.  LDL 66.  Serum glucose 168, BUN 27.  Review of Systems: Review of systems as noted in the HPI. All other systems reviewed and are negative.   Past Medical History:  Diagnosis Date   Anxiety state 03/16/2016   Aphasia 01/26/2017   Benign localized prostatic hyperplasia with lower urinary tract symptoms (LUTS)    BPV (benign positional vertigo) 11/02/2014   Carotid artery stenosis    PER DUPLEX 10-22-2015  BILATERAL ICA 1-39%   Coronary artery disease    CARDIOLOGIST-  DR Verdis Prime--  VISIT EVERY OTHER YEAR-- LOV 2015   Coronary atherosclerosis 07/19/2010   Qualifier: Diagnosis of  By: Para March MD, Graham     Depression 01/26/2017   Diabetes mellitus without complication (HCC) 03/02/2018   GERD 07/19/2010   Qualifier: Diagnosis of  By: Para March MD, Cheree Ditto     GERD (gastroesophageal reflux disease)    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   Gout    Gout 07/18/2010    Qualifier: Diagnosis of  By: Para March MD, Cheree Ditto     H/O eye injury    chronic changes to left eye after injury   Hand weakness 03/02/2018   Hearing loss 04/21/2013   History of TIA (transient ischemic attack)    03/ 2017  no residual after brief episode loss peripheral vision   HOH (hard of hearing)    Hyperglycemia 05/17/2016   Hyperlipemia    Hyperlipidemia 07/18/2010   Qualifier: Diagnosis of  By: Para March MD, Graham     Hypertension    HYPERTENSION, BENIGN ESSENTIAL 07/18/2010   Qualifier: Diagnosis of  By: Para March MD, Graham     Hypokalemia 01/26/2017   Knee pain 03/13/2019   Left shoulder pain 09/04/2018   Pneumothorax 10/11/2019   RIGHT    Prostate cancer (HCC) UROLOGIST-  DR GRAPEY/  ONCOLOGIST-  DR MANNING   dx 2015--- Stage T1c, Gleason 3+4, PSA 4.03, vol 44cc   PVC's (premature ventricular contractions)    Rash and nonspecific skin eruption 10/14/2015   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   S/P CABG x 05 Dec 1998   Skin lesion 10/14/2015   TIA (transient ischemic attack) 10/14/2015   Ventral hernia 10/26/2011   Wears glasses    Past Surgical History:  Procedure Laterality Date   APPENDECTOMY  1975   CARDIAC CATHETERIZATION  12-22-2001   dr Verdis Prime   severe native vessel disease mLAD 60-70%,  total occulsion pCFX  and pRCA/  widely patent saphenous vein ,  free radial , and LIMA grafts/  minminal lv dysfunction, ef 60%   CHEST TUBE INSERTION Right 10/11/2019   CORONARY ARTERY BYPASS GRAFT  May 2000   LIMA to LAD,  SVG to PDA and Diagonal, Free radial graft to OM   CORONARY STENT INTERVENTION N/A 05/26/2021   Procedure: CORONARY STENT INTERVENTION;  Surgeon: Orbie Pyo, MD;  Location: [facility] INVASIVE CV LAB;  Service: Cardiovascular;  Laterality: N/A;   Exericse treadmill test  last one 01-03-2014  dr Verdis Prime   normal exercise tolerance w/ hypertensive repsonse,  no ischemic EKG changes, appropriate HR response & recovery ([facility] TM score 9;  Low Risk ,  PVC's w/ exertion)   LEFT HEART CATH AND CORS/GRAFTS ANGIOGRAPHY N/A 05/26/2021   Procedure: LEFT HEART CATH AND CORS/GRAFTS ANGIOGRAPHY;  Surgeon: Orbie Pyo, MD;  Location: [facility] INVASIVE CV LAB;  Service: Cardiovascular;  Laterality: N/A;   PROSTATE BIOPSY     RADIOACTIVE SEED IMPLANT N/A 01/13/2016   Procedure: RADIOACTIVE SEED IMPLANT/BRACHYTHERAPY IMPLANT;  Surgeon: Barron Alvine, MD;  Location: [facility];  Service: Urology;  Laterality: N/A;    Social History:  reports that he has never smoked. He has never used smokeless tobacco. He reports that he does not drink alcohol and does not use drugs.   No Known Allergies  Family History  Problem Relation Age of Onset   Cancer Father        prostate in eighties   Prostate cancer Father    Heart disease Father    Stroke Father    Dementia Mother    Cancer Sister        breast   Cancer Paternal Uncle        prostate   Colon cancer Neg Hx       Prior to Admission medications   Medication Sig Start Date End Date Taking? Authorizing Provider  allopurinol (ZYLOPRIM) 300 MG tablet TAKE 1 TABLET DAILY 12/18/22   Joaquim Nam, MD  aspirin EC 81 MG tablet Take 1 tablet (81 mg total) by mouth daily. 06/01/19   Lyn Records, MD  atorvastatin (LIPITOR) 80 MG tablet TAKE 1 TABLET DAILY 10/26/22   Dyann Kief, PA-C  Cholecalciferol (VITAMIN D PO) Take 1 capsule by mouth daily.    [provider]  clopidogrel (PLAVIX) 75 MG tablet Take 1 tablet (75 mg total) by mouth daily with breakfast. 03/10/22   Lyn Records, MD  escitalopram (LEXAPRO) 5 MG tablet TAKE 1 TABLET DAILY 12/18/22   Joaquim Nam, MD  losartan-hydrochlorothiazide (HYZAAR) 50-12.5 MG tablet Take 1 tablet by mouth daily. 11/03/22   Dyann Kief, PA-C  metoprolol tartrate (LOPRESSOR) 25 MG tablet TAKE ONE-HALF (1/2) TABLET TWICE A DAY 07/17/22   Lyn Records, MD  Misc Natural Products (BLACK CHERRY CONCENTRATE PO) Take 1 tablet by mouth  at bedtime.    [provider]  Multiple Vitamins-Minerals (OCUVITE PO) Take 1 capsule by mouth daily.    [provider]  nitroGLYCERIN (NITROSTAT) 0.4 MG SL tablet Place 1 tablet (0.4 mg total) under the tongue every 5 (five) minutes as needed for chest pain. 05/27/21   Marjie Skiff E, PA-C  pantoprazole (PROTONIX) 40 MG tablet TAKE 1 TABLET DAILY 09/22/22   Marjie Skiff E, PA-C  Propylene Glycol (SYSTANE BALANCE) 0.6 % SOLN Place 1 drop into both eyes daily as needed (dry eyes).    [provider]  sodium chloride (OCEAN) 0.65 % SOLN nasal  spray Place 1 spray into both nostrils as needed for congestion.    [provider]  tamsulosin (FLOMAX) 0.4 MG CAPS capsule TAKE 1 CAPSULE TWICE A DAY 12/18/22   Joaquim Nam, MD    Physical Exam: BP (!) 143/96   Pulse 70   Temp 98.6 F (37 C) (Oral)   Resp 17   Ht 5\' 9"  (1.753 m)   Wt 99.8 kg   SpO2 98%   BMI 32.49 kg/m   General: 72 y.o. year-old male well developed well nourished in no acute distress.  Alert and oriented x3. Cardiovascular: Regular rate and rhythm with no rubs or gallops.  No thyromegaly or JVD noted.  No lower extremity edema. 2/4 pulses in all 4 extremities. Respiratory: Clear to auscultation with no wheezes or rales. Good inspiratory effort. Abdomen: Soft nontender nondistended with normal bowel sounds x4 quadrants. Muskuloskeletal: No cyanosis, clubbing or edema noted bilaterally Neuro: CN II-XII intact, strength, sensation, reflexes Skin: No ulcerative lesions noted or rashes Psychiatry: Judgement and insight appear normal. Mood is appropriate for condition and setting          Labs on Admission:  Basic Metabolic Panel: Recent Labs  Lab 12/21/22 1636 12/21/22 1647  NA 139 140  K 3.7 3.9  CL 103 103  CO2 26  --   GLUCOSE 169* 168*  BUN 25* 27*  CREATININE 1.13 1.00  CALCIUM 9.0  --    Liver Function Tests: Recent Labs  Lab 12/21/22 1636  AST 20  ALT 15   ALKPHOS 110  BILITOT 1.5*  PROT 7.0  ALBUMIN 3.9   No results for input(s): "LIPASE", "AMYLASE" in the last 168 hours. No results for input(s): "AMMONIA" in the last 168 hours. CBC: Recent Labs  Lab 12/21/22 1636 12/21/22 1647  WBC 6.2  --   NEUTROABS 4.2  --   HGB 13.2 13.3  HCT 40.4 39.0  MCV 91.2  --   PLT 182  --    Cardiac Enzymes: No results for input(s): "CKTOTAL", "CKMB", "CKMBINDEX", "TROPONINI" in the last 168 hours.  BNP (last 3 results) No results for input(s): "BNP" in the last 8760 hours.  ProBNP (last 3 results) No results for input(s): "PROBNP" in the last 8760 hours.  CBG: Recent Labs  Lab 12/21/22 1635  GLUCAP 182*    Radiological Exams on Admission: MR BRAIN WO CONTRAST  Result Date: 12/21/2022 CLINICAL DATA:  Acute neurologic deficit; left-sided weakness EXAM: MRI HEAD WITHOUT CONTRAST TECHNIQUE: Multiplanar, multiecho pulse sequences of the brain and surrounding structures were obtained without intravenous contrast. COMPARISON:  01/27/2017 FINDINGS: Brain: Intermediate sized area of acute ischemia within the right anterior cerebral artery territory. No acute or chronic hemorrhage. There is multifocal hyperintense T2-weighted signal within the white matter. Generalized volume loss. Old right basal ganglia small vessel infarct. The midline structures are normal. Vascular: Major flow voids are preserved. Skull and upper cervical spine: Normal calvarium and skull base. Visualized upper cervical spine and soft tissues are normal. Sinuses/Orbits:No paranasal sinus fluid levels or advanced mucosal thickening. No mastoid or middle ear effusion. Normal orbits. IMPRESSION: Intermediate sized area of acute ischemia within the right anterior cerebral artery territory. No hemorrhage or mass effect. Electronically Signed   By: Deatra Robinson M.D.   On: 12/21/2022 23:15   CT HEAD WO CONTRAST  Result Date: 12/21/2022 CLINICAL DATA:  Neuro deficit.  Acute stroke  suspected. EXAM: CT HEAD WITHOUT CONTRAST TECHNIQUE: Contiguous axial images were obtained from the base of  the skull through the vertex without intravenous contrast. RADIATION DOSE REDUCTION: This exam was performed according to the departmental dose-optimization program which includes automated exposure control, adjustment of the mA and/or kV according to patient size and/or use of iterative reconstruction technique. COMPARISON:  01/26/2017 FINDINGS: Brain: There is significant central and cortical atrophy, consistent with small vessel disease and similar to prior study. There is no intra or extra-axial fluid collection or mass lesion. The basilar cisterns and ventricles have a normal appearance. There is no CT evidence for acute infarction or hemorrhage. There is interval but remote infarction of the RIGHT external capsule. There is no intra or extra-axial fluid collection or mass lesion. The basilar cisterns and ventricles have a normal appearance. There is no CT evidence for acute infarction or hemorrhage. Vascular: There is atherosclerotic calcification of the internal carotid arteries. No hyperdense vessels. Skull: Normal. Negative for fracture or focal lesion. Sinuses/Orbits: No acute finding. Other: None. IMPRESSION: 1. Atrophy and small vessel disease. 2. Interval but remote infarction of the RIGHT external capsule. 3. No evidence for acute intracranial abnormality. Electronically Signed   By: Norva Pavlov M.D.   On: 12/21/2022 18:18    EKG: I independently viewed the EKG done and my findings are as followed: Normal sinus rhythm rate of 91.  Nonspecific ST-T changes.  QTc 452.  Assessment/Plan Present on Admission:  Acute CVA (cerebrovascular accident) [facility])  Principal Problem:   Acute CVA (cerebrovascular accident) (HCC)  Acute right ACA CVA, seen on MRI brain. Prior to admission on DAPT aspirin and Plavix as well as high intensity statin Lipitor 80 mg daily, per his wife he has been  compliant with his home medications, restarted.   Admitted for stroke workup Follow 2D echo LDL 66, goal less than 70 V7Q 6.6, goal less than 7.0. Permissive hypertension treat SBP greater than 220 or DBP greater than 120 PT/OT/speech therapist assessment Fall precautions/aspiration precautions Neuro checks Stroke team following  Type 2 diabetes with hyperglycemia Hemoglobin A1c 6.6 Insulin sliding scale every 4 hours while NPO  Coronary artery disease Hold off oral antihypertensives due to permissive HTN Resume home DAPT, high intensity statin Denies any anginal symptoms.  GERD Resume home regimen  BPH Resume home regimen  Chronic anxiety/depression Stable Resume home escitalopram      DVT prophylaxis: Subcu Lovenox daily  Code Status: Full code  Family Communication: Updated the patient's wife and daughter at bedside.  Disposition Plan: Admitted to telemetry medical unit  Consults called: Neurology/stroke team.  Admission status: Inpatient status.   Status is: Inpatient The patient requires at least 2 midnights for further evaluation and treatment of present condition.   Darlin Drop MD [facility] Pager [phone]  If 7PM-7AM, please contact night-coverage [email or web address] Password [email or web address]  12/22/2022, 12:04 AM

## 2022-12-22 NOTE — Progress Notes (Signed)
SLP Cancellation Note  Patient Details Name: Tyler Guerra. MRN: [id number] DOB: Aug 24, 1950   Cancelled treatment:       Reason Eval/Treat Not Completed: Patient at procedure or test/unavailable (Pt undergoing echocardiogram. Per chart review, pt passed [facility] Swallow Screening. Will continue efforts as appropriate.)  Clyde Canterbury, M.S., CCC-SLP Speech-Language Pathologist Secure Chat Preferred  O: [phone]  Woodroe Chen 12/22/2022, 10:30 AM

## 2022-12-22 NOTE — Care Management CC44 (Signed)
Condition Code 44 Documentation Completed  Patient Details  Name: Tyler Guerra. MRN: [id number] Date of Birth: [birth year]   Condition Code 44 given:  Yes Patient signature on Condition Code 44 notice:  Yes Documentation of 2 MD's agreement:  Yes Code 44 added to claim:  Yes    Kermit Balo, RN 12/22/2022, 5:17 PM

## 2022-12-22 NOTE — ED Notes (Signed)
ED TO INPATIENT HANDOFF REPORT  ED Nurse Name and Phone #: Brett Canales [phone]  S Name/Age/Gender Tyler Guerra. 72 y.o. male Room/Bed: 041C/041C  Code Status   Code Status: Full Code  Home/SNF/Other Home Patient oriented to: self, place, time, and situation Is this baseline? Yes   Triage Complete: Triage complete  Chief Complaint Acute CVA (cerebrovascular accident) (HCC) [I63.9]  Triage Note Pt c/o increased generalized weakness over past month; hx stent placement last year; family endorses some occ confusion noted; L sided weakness noted; pt a and o x 4 at present; denies pain   Allergies No Known Allergies  Level of Care/Admitting Diagnosis ED Disposition     ED Disposition  Admit   Condition  --   Comment  Hospital Area: [facility] [[id number]]  Level of Care: Telemetry Medical [104]  May admit patient to [facility] or [facility] if equivalent level of care is available:: No  Covid Evaluation: Asymptomatic - no recent exposure (last 10 days) testing not required  Diagnosis: Acute CVA (cerebrovascular accident) [facility]) [[id number]]  Admitting Physician: Darlin Drop [[id number]]  Attending Physician: Darlin Drop [[id number]]  Certification:: I certify this patient will need inpatient services for at least 2 midnights  Estimated Length of Stay: 2          B Medical/Surgery History Past Medical History:  Diagnosis Date   Anxiety state 03/16/2016   Aphasia 01/26/2017   Benign localized prostatic hyperplasia with lower urinary tract symptoms (LUTS)    BPV (benign positional vertigo) 11/02/2014   Carotid artery stenosis    PER DUPLEX 10-22-2015  BILATERAL ICA 1-39%   Coronary artery disease    CARDIOLOGIST-  DR Verdis Prime--  VISIT EVERY OTHER YEAR-- LOV 2015   Coronary atherosclerosis 07/19/2010   Qualifier: Diagnosis of  By: Para March MD, Graham     Depression 01/26/2017   Diabetes mellitus without complication (HCC) 03/02/2018   GERD 07/19/2010    Qualifier: Diagnosis of  By: Para March MD, Cheree Ditto     GERD (gastroesophageal reflux disease)    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   Gout    Gout 07/18/2010   Qualifier: Diagnosis of  By: Para March MD, Cheree Ditto     H/O eye injury    chronic changes to left eye after injury   Hand weakness 03/02/2018   Hearing loss 04/21/2013   History of TIA (transient ischemic attack)    03/ 2017  no residual after brief episode loss peripheral vision   HOH (hard of hearing)    Hyperglycemia 05/17/2016   Hyperlipemia    Hyperlipidemia 07/18/2010   Qualifier: Diagnosis of  By: Para March MD, Graham     Hypertension    HYPERTENSION, BENIGN ESSENTIAL 07/18/2010   Qualifier: Diagnosis of  By: Para March MD, Graham     Hypokalemia 01/26/2017   Knee pain 03/13/2019   Left shoulder pain 09/04/2018   Pneumothorax 10/11/2019   RIGHT    Prostate cancer (HCC) UROLOGIST-  DR GRAPEY/  ONCOLOGIST-  DR MANNING   dx 2015--- Stage T1c, Gleason 3+4, PSA 4.03, vol 44cc   PVC's (premature ventricular contractions)    Rash and nonspecific skin eruption 10/14/2015   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   S/P CABG x 05 Dec 1998   Skin lesion 10/14/2015   TIA (transient ischemic attack) 10/14/2015   Ventral hernia 10/26/2011   Wears glasses    Past Surgical History:  Procedure Laterality Date   APPENDECTOMY  1975  CARDIAC CATHETERIZATION  12-22-2001   dr Verdis Prime   severe native vessel disease mLAD 60-70%,  total occulsion pCFX  and pRCA/  widely patent saphenous vein , free radial , and LIMA grafts/  minminal lv dysfunction, ef 60%   CHEST TUBE INSERTION Right 10/11/2019   CORONARY ARTERY BYPASS GRAFT  May 2000   LIMA to LAD,  SVG to PDA and Diagonal, Free radial graft to OM   CORONARY STENT INTERVENTION N/A 05/26/2021   Procedure: CORONARY STENT INTERVENTION;  Surgeon: Orbie Pyo, MD;  Location: [facility] INVASIVE [facility];  Service: Cardiovascular;  Laterality: N/A;   Exericse treadmill test  last one 01-03-2014  dr Verdis Prime   normal exercise tolerance w/ hypertensive repsonse,  no ischemic EKG changes, appropriate HR response & recovery (D[facility] TM score 9;  Low Risk , PVC's w/ exertion)   LEFT HEART CATH AND CORS/GRAFTS ANGIOGRAPHY N/A 05/26/2021   Procedure: LEFT HEART CATH AND CORS/GRAFTS ANGIOGRAPHY;  Surgeon: Orbie Pyo, MD;  Location: [facility]VAS[facility] CV [facility];  Service: Cardiovascular;  Laterality: N/A;   PROSTATE BIOPSY     RADIOACTIVE SEED IMPLANT N/A 01/13/2016   Procedure: RADIOACTIVE SEED IMPLANT/BRACHYTHERAPY IMPLANT;  Surgeon: Barron Alvine, MD;  Location: [facility];  Service: Urology;  Laterality: N/A;     A IV Location/Drains/Wounds Patient Lines/Drains/Airways Status     Active Line/Drains/Airways     Name Placement date Placement time Site Days   Peripheral IV 12/21/22 20 G 1" Right Wrist 12/21/22  2214  Wrist  1            Intake/Output Last 24 hours  Intake/Output Summary (Last 24 hours) at 12/22/2022 1318 Last data filed at 12/22/2022 0212 Gross per 24 hour  Intake --  Output 300 ml  Net -300 ml    Labs/Imaging Results for orders placed or performed during the hospital encounter of 12/21/22 (from the past 48 hour(s))  Ethanol     Status: None   Collection Time: 12/21/22  4:32 PM  Result Value Ref Range   Alcohol, Ethyl (B) <10 <10 mg/dL    Comment: (NOTE) Lowest detectable limit for serum alcohol is 10 mg/dL.  For medical purposes only. Performed at [facility], [address], [place], [place] [address]   CBG monitoring, ED     Status: Abnormal   Collection Time: 12/21/22  4:35 PM  Result Value Ref Range   Glucose-Capillary 182 (H) 70 - 99 mg/dL    Comment: Glucose reference range applies only to samples taken after fasting for at least 8 hours.  Protime-INR     Status: Abnormal   Collection Time: 12/21/22  4:36 PM  Result Value Ref Range   Prothrombin Time 15.4 (H) 11.4 - 15.2 seconds   INR 1.2 0.8 - 1.2    Comment: (NOTE) INR goal  varies based on device and disease states. Performed at [facility], [address] [place], [place] [address]   APTT     Status: None   Collection Time: 12/21/22  4:36 PM  Result Value Ref Range   aPTT 32 24 - 36 seconds    Comment: Performed at [facility], [address], [place], [place] [address]  CBC     Status: None   Collection Time: 12/21/22  4:36 PM  Result Value Ref Range   WBC 6.2 4.0 - 10.5 K/uL   RBC 4.43 4.22 - 5.81 MIL/uL   Hemoglobin 13.2 13.0 -  17.0 g/dL   HCT 16.1 09.6 - 04.5 %   MCV 91.2 80.0 - 100.0 fL   MCH 29.8 26.0 - 34.0 pg   MCHC 32.7 30.0 - 36.0 g/dL   RDW 40.9 81.1 - 91.4 %   Platelets 182 150 - 400 K/uL   nRBC 0.0 0.0 - 0.2 %    Comment: Performed at [facility] Lab, [address], [place], [place] [address]  Differential     Status: None   Collection Time: 12/21/22  4:36 PM  Result Value Ref Range   Neutrophils Relative % 68 %   Neutro Abs 4.2 1.7 - 7.7 K/uL   Lymphocytes Relative 25 %   Lymphs Abs 1.6 0.7 - 4.0 K/uL   Monocytes Relative 6 %   Monocytes Absolute 0.4 0.1 - 1.0 K/uL   Eosinophils Relative 1 %   Eosinophils Absolute 0.1 0.0 - 0.5 K/uL   Basophils Relative 0 %   Basophils Absolute 0.0 0.0 - 0.1 K/uL   Immature Granulocytes 0 %   Abs Immature Granulocytes 0.02 0.00 - 0.07 K/uL    Comment: Performed at [facility] Lab, [address], [place], [place] [address]  Comprehensive metabolic panel     Status: Abnormal   Collection Time: 12/21/22  4:36 PM  Result Value Ref Range   Sodium 139 135 - 145 mmol/L   Potassium 3.7 3.5 - 5.1 mmol/L   Chloride 103 98 - 111 mmol/L   CO2 26 22 - 32 mmol/L   Glucose, Bld 169 (H) 70 - 99 mg/dL    Comment: Glucose reference range applies only to samples taken after fasting for at least 8 hours.   BUN 25 (H) 8 - 23 mg/dL   Creatinine, Ser 8.65 0.61 - 1.24 mg/dL   Calcium 9.0 8.9 - 78.4 mg/dL   Total Protein 7.0 6.5 - 8.1 g/dL   Albumin 3.9 3.5 - 5.0 g/dL   AST 20 15 - 41  U/L   ALT 15 0 - 44 U/L   Alkaline Phosphatase 110 38 - 126 U/L   Total Bilirubin 1.5 (H) 0.3 - 1.2 mg/dL   GFR, Estimated >69 >62 mL/min    Comment: (NOTE) Calculated using the CKD-EPI Creatinine Equation (2021)    Anion gap 10 5 - 15    Comment: Performed at [facility] Lab, [address] [place], [place] [address]  I-stat chem 8, ED     Status: Abnormal   Collection Time: 12/21/22  4:47 PM  Result Value Ref Range   Sodium 140 135 - 145 mmol/L   Potassium 3.9 3.5 - 5.1 mmol/L   Chloride 103 98 - 111 mmol/L   BUN 27 (H) 8 - 23 mg/dL   Creatinine, Ser 1.32 0.61 - 1.24 mg/dL   Glucose, Bld 440 (H) 70 - 99 mg/dL    Comment: Glucose reference range applies only to samples taken after fasting for at least 8 hours.   Calcium, Ion 1.22 1.15 - 1.40 mmol/L   TCO2 28 22 - 32 mmol/L   Hemoglobin 13.3 13.0 - 17.0 g/dL   HCT 10.2 72.5 - 36.6 %  Lipid panel     Status: None   Collection Time: 12/22/22  1:21 AM  Result Value Ref Range   Cholesterol 128 0 - 200 mg/dL   Triglycerides 99 <440 mg/dL   HDL 42 >34 mg/dL   Total CHOL/HDL Ratio 3.0 RATIO   VLDL 20 0 - 40 mg/dL  LDL Cholesterol 66 0 - 99 mg/dL    Comment:        Total Cholesterol/HDL:CHD Risk Coronary Heart Disease Risk Table                     Men   Women  1/2 Average Risk   3.4   3.3  Average Risk       5.0   4.4  2 X Average Risk   9.6   7.1  3 X Average Risk  23.4   11.0        Use the calculated Patient Ratio above and the CHD Risk Table to determine the patient's CHD Risk.        ATP III CLASSIFICATION (LDL):  <100     mg/dL   Optimal  161-096  mg/dL   Near or Above                    Optimal  130-159  mg/dL   Borderline  045-409  mg/dL   High  >811     mg/dL   Very High Performed at [facility], [address] [place], [place] [address]   Hemoglobin A1c     Status: Abnormal   Collection Time: 12/22/22  1:21 AM  Result Value Ref Range   Hgb A1c MFr Bld 6.6 (H) 4.8 - 5.6 %    Comment:  (NOTE) Pre diabetes:          5.7%-6.4%  Diabetes:              >6.4%  Glycemic control for   <7.0% adults with diabetes    Mean Plasma Glucose 142.72 mg/dL    Comment: Performed at [facility] Lab, [address], [place], [place] [address]  CBG monitoring, ED     Status: Abnormal   Collection Time: 12/22/22  6:18 AM  Result Value Ref Range   Glucose-Capillary 141 (H) 70 - 99 mg/dL    Comment: Glucose reference range applies only to samples taken after fasting for at least 8 hours.  CBG monitoring, ED     Status: Abnormal   Collection Time: 12/22/22  8:05 AM  Result Value Ref Range   Glucose-Capillary 110 (H) 70 - 99 mg/dL    Comment: Glucose reference range applies only to samples taken after fasting for at least 8 hours.  CBC with Differential/Platelet     Status: Abnormal   Collection Time: 12/22/22 11:30 AM  Result Value Ref Range   WBC 6.0 4.0 - 10.5 K/uL   RBC 3.90 (L) 4.22 - 5.81 MIL/uL   Hemoglobin 11.7 (L) 13.0 - 17.0 g/dL   HCT 13.0 (L) 86.5 - 78.4 %   MCV 88.7 80.0 - 100.0 fL   MCH 30.0 26.0 - 34.0 pg   MCHC 33.8 30.0 - 36.0 g/dL   RDW 69.6 29.5 - 28.4 %   Platelets 164 150 - 400 K/uL   nRBC 0.0 0.0 - 0.2 %   Neutrophils Relative % 64 %   Neutro Abs 3.8 1.7 - 7.7 K/uL   Lymphocytes Relative 27 %   Lymphs Abs 1.7 0.7 - 4.0 K/uL   Monocytes Relative 7 %   Monocytes Absolute 0.4 0.1 - 1.0 K/uL   Eosinophils Relative 2 %   Eosinophils Absolute 0.1 0.0 - 0.5 K/uL   Basophils Relative 0 %   Basophils Absolute 0.0 0.0 - 0.1 K/uL   Immature Granulocytes 0 %  Abs Immature Granulocytes 0.02 0.00 - 0.07 K/uL    Comment: Performed at [facility], [address], [place], [place] [address]  Comprehensive metabolic panel     Status: Abnormal   Collection Time: 12/22/22 11:30 AM  Result Value Ref Range   Sodium 138 135 - 145 mmol/L   Potassium 3.4 (L) 3.5 - 5.1 mmol/L   Chloride 104 98 - 111 mmol/L   CO2 25 22 - 32 mmol/L   Glucose, Bld 132 (H) 70 - 99  mg/dL    Comment: Glucose reference range applies only to samples taken after fasting for at least 8 hours.   BUN 22 8 - 23 mg/dL   Creatinine, Ser 6.04 0.61 - 1.24 mg/dL   Calcium 8.9 8.9 - 54.0 mg/dL   Total Protein 6.5 6.5 - 8.1 g/dL   Albumin 3.5 3.5 - 5.0 g/dL   AST 18 15 - 41 U/L   ALT 15 0 - 44 U/L   Alkaline Phosphatase 101 38 - 126 U/L   Total Bilirubin 1.1 0.3 - 1.2 mg/dL   GFR, Estimated >98 >11 mL/min    Comment: (NOTE) Calculated using the CKD-EPI Creatinine Equation (2021)    Anion gap 9 5 - 15    Comment: Performed at [facility], [address], [place], [place] [address]  Magnesium     Status: None   Collection Time: 12/22/22 11:30 AM  Result Value Ref Range   Magnesium 2.0 1.7 - 2.4 mg/dL    Comment: Performed at [facility], [address] [place], [place] [address]  Phosphorus     Status: None   Collection Time: 12/22/22 11:30 AM  Result Value Ref Range   Phosphorus 3.6 2.5 - 4.6 mg/dL    Comment: Performed at [facility], [address], [place], [place] [address]  CBG monitoring, ED     Status: Abnormal   Collection Time: 12/22/22 12:38 PM  Result Value Ref Range   Glucose-Capillary 163 (H) 70 - 99 mg/dL    Comment: Glucose reference range applies only to samples taken after fasting for at least 8 hours.   ECHOCARDIOGRAM COMPLETE  Result Date: 12/22/2022    ECHOCARDIOGRAM REPORT   Patient Name:   Tyler Guerra. Date of Exam: 12/22/2022 Medical Rec #:  [id number]          Height:       69.0 in Accession #:    [id number]         Weight:       220.0 lb Date of Birth:  1951/02/18          BSA:          2.151 m Patient Age:    71 years           BP:           107/64 mmHg Patient Gender: M                  HR:           58 bpm. Exam Location:  Inpatient Procedure: 2D Echo, Cardiac Doppler and Color Doppler Indications:    Stroke I63.9  History:        Patient has prior history of Echocardiogram examinations, most                 recent  05/25/2021. CAD and Previous Myocardial Infarction, Prior  CABG, Stroke and TIA; Risk Factors:Hypertension, Diabetes and                 Dyslipidemia.  Sonographer:    Lucendia Herrlich Referring Phys: [id number] [facility] KHALIQDINA IMPRESSIONS  1. Left ventricular ejection fraction, by estimation, is 55 to 60%. The left ventricle has normal function. The left ventricle demonstrates regional wall motion abnormalities (see scoring diagram/findings for description). There is mild concentric left ventricular hypertrophy. Left ventricular diastolic parameters are consistent with Grade I diastolic dysfunction (impaired relaxation). There is mild dyskinesis of the left ventricular, basal inferior wall. There is moderate hypokinesis of the left ventricular, basal-mid inferoseptal wall.  2. Right ventricular systolic function is normal. The right ventricular size is normal. Tricuspid regurgitation signal is inadequate for assessing PA pressure.  3. The mitral valve is normal in structure. No evidence of mitral valve regurgitation.  4. The aortic valve is tricuspid. There is mild calcification of the aortic valve. There is mild thickening of the aortic valve. Aortic valve regurgitation is trivial. Mild aortic valve stenosis. Comparison(s): Prior images reviewed side by side. The left ventricular function is unchanged. The left ventricular wall motion abnormality is unchanged. FINDINGS  Left Ventricle: Left ventricular ejection fraction, by estimation, is 55 to 60%. The left ventricle has normal function. The left ventricle demonstrates regional wall motion abnormalities. Mild dyskinesis of the left ventricular, basal inferior wall. Moderate hypokinesis of the left ventricular, basal-mid inferoseptal wall. The left ventricular internal cavity size was normal in size. There is mild concentric left ventricular hypertrophy. Left ventricular diastolic parameters are consistent with Grade I diastolic dysfunction (impaired  relaxation). Normal left ventricular filling pressure.  LV Wall Scoring: The basal inferior segment is dyskinetic. The mid inferoseptal segment and basal inferoseptal segment are hypokinetic. The entire anterior wall, entire lateral wall, entire anterior septum, entire apex, and mid and distal inferior wall are normal. Right Ventricle: The right ventricular size is normal. No increase in right ventricular wall thickness. Right ventricular systolic function is normal. Tricuspid regurgitation signal is inadequate for assessing PA pressure. Left Atrium: Left atrial size was normal in size. Right Atrium: Right atrial size was normal in size. Pericardium: There is no evidence of pericardial effusion. Mitral Valve: The mitral valve is normal in structure. No evidence of mitral valve regurgitation. Tricuspid Valve: The tricuspid valve is normal in structure. Tricuspid valve regurgitation is not demonstrated. Aortic Valve: The aortic valve is tricuspid. There is mild calcification of the aortic valve. There is mild thickening of the aortic valve. Aortic valve regurgitation is trivial. Aortic regurgitation PHT measures 333 msec. Mild aortic stenosis is present. Aortic valve mean gradient measures 10.0 mmHg. Aortic valve peak gradient measures 18.4 mmHg. Aortic valve area, by VTI measures 1.70 cm. Pulmonic Valve: The pulmonic valve was grossly normal. Pulmonic valve regurgitation is not visualized. Aorta: The aortic root and ascending aorta are structurally normal, with no evidence of dilitation. IAS/Shunts: No atrial level shunt detected by color flow Doppler.  LEFT VENTRICLE PLAX 2D LVIDd:         4.50 cm   Diastology LVIDs:         2.80 cm   LV e' medial:    7.83 cm/s LV PW:         1.30 cm   LV E/e' medial:  7.1 LV IVS:        1.30 cm   LV e' lateral:   8.27 cm/s LVOT diam:     2.10 cm   LV  E/e' lateral: 6.7 LV SV:         70 LV SV Index:   32 LVOT Area:     3.46 cm  RIGHT VENTRICLE            IVC RV S prime:     6.85  cm/s  IVC diam: 1.80 cm TAPSE (M-mode): 1.4 cm LEFT ATRIUM             Index        RIGHT ATRIUM           Index LA diam:        3.50 cm 1.63 cm/m   RA Area:     16.20 cm LA Vol (A2C):   43.1 ml 20.03 ml/m  RA Volume:   40.40 ml  18.78 ml/m LA Vol (A4C):   44.0 ml 20.45 ml/m LA Biplane Vol: 45.5 ml 21.15 ml/m  AORTIC VALVE                     PULMONIC VALVE AV Area (Vmax):    1.62 cm      PR End Diast Vel: 4.49 msec AV Area (Vmean):   1.51 cm AV Area (VTI):     1.70 cm AV Vmax:           214.25 cm/s AV Vmean:          143.750 cm/s AV VTI:            0.410 m AV Peak Grad:      18.4 mmHg AV Mean Grad:      10.0 mmHg LVOT Vmax:         100.27 cm/s LVOT Vmean:        62.533 cm/s LVOT VTI:          0.201 m LVOT/AV VTI ratio: 0.49 AI PHT:            333 msec  AORTA Ao Root diam: 3.40 cm Ao Asc diam:  3.40 cm MITRAL VALVE MV Area (PHT): 2.62 cm    SHUNTS MV Decel Time: 289 msec    Systemic VTI:  0.20 m MV E velocity: 55.50 cm/s  Systemic Diam: 2.10 cm MV A velocity: 82.80 cm/s MV E/A ratio:  0.67 Mihai Croitoru MD Electronically signed by Thurmon Fair MD Signature Date/Time: 12/22/2022/11:23:45 AM    Final    CT ANGIO HEAD NECK W WO CM  Result Date: 12/22/2022 CLINICAL DATA:  Left-sided weakness EXAM: CT ANGIOGRAPHY HEAD AND NECK WITH AND WITHOUT CONTRAST TECHNIQUE: Multidetector CT imaging of the head and neck was performed using the standard protocol during bolus administration of intravenous contrast. Multiplanar CT image reconstructions and MIPs were obtained to evaluate the vascular anatomy. Carotid stenosis measurements (when applicable) are obtained utilizing NASCET criteria, using the distal internal carotid diameter as the denominator. RADIATION DOSE REDUCTION: This exam was performed according to the departmental dose-optimization program which includes automated exposure control, adjustment of the mA and/or kV according to patient size and/or use of iterative reconstruction technique. CONTRAST:   75mL OMNIPAQUE IOHEXOL 350 MG/ML SOLN COMPARISON:  None Available. FINDINGS: CTA NECK FINDINGS SKELETON: There is no bony spinal canal stenosis. No lytic or blastic lesion. OTHER NECK: Normal pharynx, larynx and major salivary glands. No cervical lymphadenopathy. Unremarkable thyroid gland. UPPER CHEST: No pneumothorax or pleural effusion. No nodules or masses. AORTIC ARCH: There is calcific atherosclerosis of the aortic arch. There is no aneurysm, dissection or hemodynamically significant stenosis of the visualized  portion of the aorta. Conventional 3 vessel aortic branching pattern. The visualized proximal subclavian arteries are widely patent. RIGHT CAROTID SYSTEM: Normal without aneurysm, dissection or stenosis. LEFT CAROTID SYSTEM: Normal without aneurysm, dissection or stenosis. VERTEBRAL ARTERIES: Left dominant configuration. Both origins are clearly patent. There is no dissection, occlusion or flow-limiting stenosis to the skull base (V1-V3 segments). CTA HEAD FINDINGS POSTERIOR CIRCULATION: --Vertebral arteries: Normal V4 segments. --Inferior cerebellar arteries: Normal. --Basilar artery: Normal. --Superior cerebellar arteries: Normal. --Posterior cerebral arteries (PCA): Normal. ANTERIOR CIRCULATION: --Intracranial internal carotid arteries: Atherosclerotic calcification of the internal carotid arteries at the skull base without hemodynamically significant stenosis. --Anterior cerebral arteries (ACA): Normal. Absent right A1 segment, normal variant --Middle cerebral arteries (MCA): Normal. VENOUS SINUSES: As permitted by contrast timing, patent. ANATOMIC VARIANTS: Fetal origin of the right posterior cerebral artery. Review of the MIP images confirms the above findings. IMPRESSION: No emergent large vessel occlusion or hemodynamically significant stenosis of the head or neck. Aortic atherosclerosis (ICD10-I70.0). Electronically Signed   By: Deatra Robinson M.D.   On: 12/22/2022 02:28   MR BRAIN WO  CONTRAST  Result Date: 12/21/2022 CLINICAL DATA:  Acute neurologic deficit; left-sided weakness EXAM: MRI HEAD WITHOUT CONTRAST TECHNIQUE: Multiplanar, multiecho pulse sequences of the brain and surrounding structures were obtained without intravenous contrast. COMPARISON:  01/27/2017 FINDINGS: Brain: Intermediate sized area of acute ischemia within the right anterior cerebral artery territory. No acute or chronic hemorrhage. There is multifocal hyperintense T2-weighted signal within the white matter. Generalized volume loss. Old right basal ganglia small vessel infarct. The midline structures are normal. Vascular: Major flow voids are preserved. Skull and upper cervical spine: Normal calvarium and skull base. Visualized upper cervical spine and soft tissues are normal. Sinuses/Orbits:No paranasal sinus fluid levels or advanced mucosal thickening. No mastoid or middle ear effusion. Normal orbits. IMPRESSION: Intermediate sized area of acute ischemia within the right anterior cerebral artery territory. No hemorrhage or mass effect. Electronically Signed   By: Deatra Robinson M.D.   On: 12/21/2022 23:15   CT HEAD WO CONTRAST  Result Date: 12/21/2022 CLINICAL DATA:  Neuro deficit.  Acute stroke suspected. EXAM: CT HEAD WITHOUT CONTRAST TECHNIQUE: Contiguous axial images were obtained from the base of the skull through the vertex without intravenous contrast. RADIATION DOSE REDUCTION: This exam was performed according to the departmental dose-optimization program which includes automated exposure control, adjustment of the mA and/or kV according to patient size and/or use of iterative reconstruction technique. COMPARISON:  01/26/2017 FINDINGS: Brain: There is significant central and cortical atrophy, consistent with small vessel disease and similar to prior study. There is no intra or extra-axial fluid collection or mass lesion. The basilar cisterns and ventricles have a normal appearance. There is no CT evidence  for acute infarction or hemorrhage. There is interval but remote infarction of the RIGHT external capsule. There is no intra or extra-axial fluid collection or mass lesion. The basilar cisterns and ventricles have a normal appearance. There is no CT evidence for acute infarction or hemorrhage. Vascular: There is atherosclerotic calcification of the internal carotid arteries. No hyperdense vessels. Skull: Normal. Negative for fracture or focal lesion. Sinuses/Orbits: No acute finding. Other: None. IMPRESSION: 1. Atrophy and small vessel disease. 2. Interval but remote infarction of the RIGHT external capsule. 3. No evidence for acute intracranial abnormality. Electronically Signed   By: Norva Pavlov M.D.   On: 12/21/2022 18:18    Pending Labs [employer] (From admission, onward)     Start     Ordered  12/29/22 0500  Creatinine, serum  (enoxaparin (LOVENOX)    CrCl >/= 30 ml/min)  Weekly,   R     Comments: while on enoxaparin therapy    12/22/22 0550   12/23/22 0500  CBC  Tomorrow morning,   R        12/22/22 0601   12/23/22 0500  Basic metabolic panel  Tomorrow morning,   R        12/22/22 0601   12/23/22 0500  Magnesium  Tomorrow morning,   R        12/22/22 0601   12/23/22 0500  HIV Antibody (routine testing w rflx)  (HIV Antibody (Routine testing w reflex) panel)  Tomorrow morning,   R        12/22/22 0601            Vitals/Pain Today's Vitals   12/22/22 0854 12/22/22 1145 12/22/22 1240 12/22/22 1315  BP:  116/80  102/84  Pulse:  69  84  Resp:  12  12  Temp: 98.1 F (36.7 C)  98.1 F (36.7 C)   TempSrc: Oral  Oral   SpO2:  94%  96%  Weight:      Height:      PainSc:        Isolation Precautions No active isolations  Medications Medications  sodium chloride flush (NS) 0.9 % injection 3 mL (3 mLs Intravenous Not Given 12/21/22 2054)  acetaminophen (TYLENOL) tablet 650 mg (650 mg Oral Given 12/21/22 2325)  aspirin EC tablet 81 mg (81 mg Oral Given 12/22/22 0916)   clopidogrel (PLAVIX) tablet 75 mg (75 mg Oral Given 12/22/22 0916)  enoxaparin (LOVENOX) injection 40 mg (40 mg Subcutaneous Given 12/22/22 0915)  insulin aspart (novoLOG) injection 0-9 Units (2 Units Subcutaneous Given 12/22/22 1241)  escitalopram (LEXAPRO) tablet 5 mg (5 mg Oral Given 12/22/22 0915)  pantoprazole (PROTONIX) EC tablet 40 mg (40 mg Oral Given 12/22/22 0916)  tamsulosin (FLOMAX) capsule 0.4 mg (0.4 mg Oral Given 12/22/22 0916)  atorvastatin (LIPITOR) tablet 80 mg (80 mg Oral Given 12/22/22 0916)  labetalol (NORMODYNE) injection 5 mg (has no administration in time range)  prochlorperazine (COMPAZINE) injection 5 mg (has no administration in time range)  melatonin tablet 3 mg (has no administration in time range)  polyethylene glycol (MIRALAX / GLYCOLAX) packet 17 g (has no administration in time range)  midazolam (VERSED) injection 1 mg (1 mg Intravenous Given 12/21/22 2228)  iohexol (OMNIPAQUE) 350 MG/ML injection 75 mL (75 mLs Intravenous Contrast Given 12/22/22 0210)    Mobility walks     Focused Assessments Neuro Assessment Handoff:  Swallow screen pass? Yes  Cardiac Rhythm: Normal sinus rhythm NIH Stroke Scale  Dizziness Present: No Headache Present: No Interval: Shift assessment Level of Consciousness (1a.)   : Alert, keenly responsive LOC Questions (1b. )   : Answers both questions correctly LOC Commands (1c. )   : Performs both tasks correctly Best Gaze (2. )  : Normal Visual (3. )  : No visual loss Facial Palsy (4. )    : Normal symmetrical movements Motor Arm, Left (5a. )   : No drift Motor Arm, Right (5b. ) : No drift Motor Leg, Left (6a. )  : No drift Motor Leg, Right (6b. ) : No drift Limb Ataxia (7. ): Absent Sensory (8. )  : Normal, no sensory loss Best Language (9. )  : Mild-to-moderate aphasia Dysarthria (10. ): Normal Extinction/Inattention (11.)   : No Abnormality Complete NIHSS TOTAL:  1     Neuro Assessment: Exceptions to WDL Neuro Checks:    Initial (12/21/22 2108)  Has TPA been given? No If patient is a Neuro Trauma and patient is going to OR before floor call report to 4N Charge nurse: [phone] or [phone]   R Recommendations: See Admitting Provider Note  Report given to:   Additional Notes:

## 2022-12-22 NOTE — Progress Notes (Addendum)
STROKE TEAM PROGRESS NOTE   INTERVAL HISTORY His wife and daughter are at the bedside.   Patient presented 5/20 to ED with left leg weakness x 3 weeks, bedbound since 5/16.  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  Patient previously on aspirin and Plavix, recommend changing aspirin and Brilinta if copay is affordable.  Plan of care reviewed with patient and family at bedside.  Recommend loop recorder on discharge, EPS aware.   Vitals:   12/22/22 0854 12/22/22 1145 12/22/22 1240 12/22/22 1315  BP:  116/80  102/84  Pulse:  69  84  Resp:  12  12  Temp: 98.1 F (36.7 C)  98.1 F (36.7 C)   TempSrc: Oral  Oral   SpO2:  94%  96%  Weight:      Height:       CBC:  Recent Labs  Lab 12/21/22 1636 12/21/22 1647 12/22/22 1130  WBC 6.2  --  6.0  NEUTROABS 4.2  --  3.8  HGB 13.2 13.3 11.7*  HCT 40.4 39.0 34.6*  MCV 91.2  --  88.7  PLT 182  --  164   Basic Metabolic Panel:  Recent Labs  Lab 12/21/22 1636 12/21/22 1647 12/22/22 1130  NA 139 140 138  K 3.7 3.9 3.4*  CL 103 103 104  CO2 26  --  25  GLUCOSE 169* 168* 132*  BUN 25* 27* 22  CREATININE 1.13 1.00 1.01  CALCIUM 9.0  --  8.9  MG  --   --  2.0  PHOS  --   --  3.6   Lipid Panel:  Recent Labs  Lab 12/22/22 0121  CHOL 128  TRIG 99  HDL 42  CHOLHDL 3.0  VLDL 20  LDLCALC 66   HgbA1c:  Recent Labs  Lab 12/22/22 0121  HGBA1C 6.6*   Urine Drug Screen: No results for input(s): "LABOPIA", "COCAINSCRNUR", "LABBENZ", "AMPHETMU", "THCU", "LABBARB" in the last 168 hours.  Alcohol Level  Recent Labs  Lab 12/21/22 1632  ETH <10    IMAGING past 24 hours ECHOCARDIOGRAM COMPLETE  Result Date: 12/22/2022    ECHOCARDIOGRAM REPORT   Patient Name:   Tyler Guerra. Date of Exam: 12/22/2022 Medical Rec #:  [id number]          Height:       69.0 in Accession #:    [id number]         Weight:       220.0 lb Date of Birth:  [birth year]          BSA:          2.151 m Patient Age:    72 years           BP:            107/64 mmHg Patient Gender: M                  HR:           58 bpm. Exam Location:  Inpatient Procedure: 2D Echo, Cardiac Doppler and Color Doppler Indications:    Stroke I63.9  History:        Patient has prior history of Echocardiogram examinations, most                 recent 05/25/2021. CAD and Previous Myocardial Infarction, Prior                 CABG, Stroke and TIA; Risk Factors:Hypertension, Diabetes  and                 Dyslipidemia.  Sonographer:    Lucendia Herrlich Referring Phys: [id number] [facility] KHALIQDINA IMPRESSIONS  1. Left ventricular ejection fraction, by estimation, is 55 to 60%. The left ventricle has normal function. The left ventricle demonstrates regional wall motion abnormalities (see scoring diagram/findings for description). There is mild concentric left ventricular hypertrophy. Left ventricular diastolic parameters are consistent with Grade I diastolic dysfunction (impaired relaxation). There is mild dyskinesis of the left ventricular, basal inferior wall. There is moderate hypokinesis of the left ventricular, basal-mid inferoseptal wall.  2. Right ventricular systolic function is normal. The right ventricular size is normal. Tricuspid regurgitation signal is inadequate for assessing PA pressure.  3. The mitral valve is normal in structure. No evidence of mitral valve regurgitation.  4. The aortic valve is tricuspid. There is mild calcification of the aortic valve. There is mild thickening of the aortic valve. Aortic valve regurgitation is trivial. Mild aortic valve stenosis. Comparison(s): Prior images reviewed side by side. The left ventricular function is unchanged. The left ventricular wall motion abnormality is unchanged. FINDINGS  Left Ventricle: Left ventricular ejection fraction, by estimation, is 55 to 60%. The left ventricle has normal function. The left ventricle demonstrates regional wall motion abnormalities. Mild dyskinesis of the left ventricular, basal inferior wall.  Moderate hypokinesis of the left ventricular, basal-mid inferoseptal wall. The left ventricular internal cavity size was normal in size. There is mild concentric left ventricular hypertrophy. Left ventricular diastolic parameters are consistent with Grade I diastolic dysfunction (impaired relaxation). Normal left ventricular filling pressure.  LV Wall Scoring: The basal inferior segment is dyskinetic. The mid inferoseptal segment and basal inferoseptal segment are hypokinetic. The entire anterior wall, entire lateral wall, entire anterior septum, entire apex, and mid and distal inferior wall are normal. Right Ventricle: The right ventricular size is normal. No increase in right ventricular wall thickness. Right ventricular systolic function is normal. Tricuspid regurgitation signal is inadequate for assessing PA pressure. Left Atrium: Left atrial size was normal in size. Right Atrium: Right atrial size was normal in size. Pericardium: There is no evidence of pericardial effusion. Mitral Valve: The mitral valve is normal in structure. No evidence of mitral valve regurgitation. Tricuspid Valve: The tricuspid valve is normal in structure. Tricuspid valve regurgitation is not demonstrated. Aortic Valve: The aortic valve is tricuspid. There is mild calcification of the aortic valve. There is mild thickening of the aortic valve. Aortic valve regurgitation is trivial. Aortic regurgitation PHT measures 333 msec. Mild aortic stenosis is present. Aortic valve mean gradient measures 10.0 mmHg. Aortic valve peak gradient measures 18.4 mmHg. Aortic valve area, by VTI measures 1.70 cm. Pulmonic Valve: The pulmonic valve was grossly normal. Pulmonic valve regurgitation is not visualized. Aorta: The aortic root and ascending aorta are structurally normal, with no evidence of dilitation. IAS/Shunts: No atrial level shunt detected by color flow Doppler.  LEFT VENTRICLE PLAX 2D LVIDd:         4.50 cm   Diastology LVIDs:         2.80  cm   LV e' medial:    7.83 cm/s LV PW:         1.30 cm   LV E/e' medial:  7.1 LV IVS:        1.30 cm   LV e' lateral:   8.27 cm/s LVOT diam:     2.10 cm   LV E/e' lateral: 6.7 LV SV:  70 LV SV Index:   32 LVOT Area:     3.46 cm  RIGHT VENTRICLE            IVC RV S prime:     6.85 cm/s  IVC diam: 1.80 cm TAPSE (M-mode): 1.4 cm LEFT ATRIUM             Index        RIGHT ATRIUM           Index LA diam:        3.50 cm 1.63 cm/m   RA Area:     16.20 cm LA Vol (A2C):   43.1 ml 20.03 ml/m  RA Volume:   40.40 ml  18.78 ml/m LA Vol (A4C):   44.0 ml 20.45 ml/m LA Biplane Vol: 45.5 ml 21.15 ml/m  AORTIC VALVE                     PULMONIC VALVE AV Area (Vmax):    1.62 cm      PR End Diast Vel: 4.49 msec AV Area (Vmean):   1.51 cm AV Area (VTI):     1.70 cm AV Vmax:           214.25 cm/s AV Vmean:          143.750 cm/s AV VTI:            0.410 m AV Peak Grad:      18.4 mmHg AV Mean Grad:      10.0 mmHg LVOT Vmax:         100.27 cm/s LVOT Vmean:        62.533 cm/s LVOT VTI:          0.201 m LVOT/AV VTI ratio: 0.49 AI PHT:            333 msec  AORTA Ao Root diam: 3.40 cm Ao Asc diam:  3.40 cm MITRAL VALVE MV Area (PHT): 2.62 cm    SHUNTS MV Decel Time: 289 msec    Systemic VTI:  0.20 m MV E velocity: 55.50 cm/s  Systemic Diam: 2.10 cm MV A velocity: 82.80 cm/s MV E/A ratio:  0.67 Mihai Croitoru MD Electronically signed by Thurmon Fair MD Signature Date/Time: 12/22/2022/11:23:45 AM    Final    CT ANGIO HEAD NECK W WO CM  Result Date: 12/22/2022 CLINICAL DATA:  Left-sided weakness EXAM: CT ANGIOGRAPHY HEAD AND NECK WITH AND WITHOUT CONTRAST TECHNIQUE: Multidetector CT imaging of the head and neck was performed using the standard protocol during bolus administration of intravenous contrast. Multiplanar CT image reconstructions and MIPs were obtained to evaluate the vascular anatomy. Carotid stenosis measurements (when applicable) are obtained utilizing NASCET criteria, using the distal internal carotid diameter  as the denominator. RADIATION DOSE REDUCTION: This exam was performed according to the departmental dose-optimization program which includes automated exposure control, adjustment of the mA and/or kV according to patient size and/or use of iterative reconstruction technique. CONTRAST:  75mL OMNIPAQUE IOHEXOL 350 MG/ML SOLN COMPARISON:  None Available. FINDINGS: CTA NECK FINDINGS SKELETON: There is no bony spinal canal stenosis. No lytic or blastic lesion. OTHER NECK: Normal pharynx, larynx and major salivary glands. No cervical lymphadenopathy. Unremarkable thyroid gland. UPPER CHEST: No pneumothorax or pleural effusion. No nodules or masses. AORTIC ARCH: There is calcific atherosclerosis of the aortic arch. There is no aneurysm, dissection or hemodynamically significant stenosis of the visualized portion of the aorta. Conventional 3 vessel aortic branching pattern. The visualized proximal subclavian  arteries are widely patent. RIGHT CAROTID SYSTEM: Normal without aneurysm, dissection or stenosis. LEFT CAROTID SYSTEM: Normal without aneurysm, dissection or stenosis. VERTEBRAL ARTERIES: Left dominant configuration. Both origins are clearly patent. There is no dissection, occlusion or flow-limiting stenosis to the skull base (V1-V3 segments). CTA HEAD FINDINGS POSTERIOR CIRCULATION: --Vertebral arteries: Normal V4 segments. --Inferior cerebellar arteries: Normal. --Basilar artery: Normal. --Superior cerebellar arteries: Normal. --Posterior cerebral arteries (PCA): Normal. ANTERIOR CIRCULATION: --Intracranial internal carotid arteries: Atherosclerotic calcification of the internal carotid arteries at the skull base without hemodynamically significant stenosis. --Anterior cerebral arteries (ACA): Normal. Absent right A1 segment, normal variant --Middle cerebral arteries (MCA): Normal. VENOUS SINUSES: As permitted by contrast timing, patent. ANATOMIC VARIANTS: Fetal origin of the right posterior cerebral artery. Review  of the MIP images confirms the above findings. IMPRESSION: No emergent large vessel occlusion or hemodynamically significant stenosis of the head or neck. Aortic atherosclerosis (ICD10-I70.0). Electronically Signed   By: Deatra Robinson M.D.   On: 12/22/2022 02:28   MR BRAIN WO CONTRAST  Result Date: 12/21/2022 CLINICAL DATA:  Acute neurologic deficit; left-sided weakness EXAM: MRI HEAD WITHOUT CONTRAST TECHNIQUE: Multiplanar, multiecho pulse sequences of the brain and surrounding structures were obtained without intravenous contrast. COMPARISON:  01/27/2017 FINDINGS: Brain: Intermediate sized area of acute ischemia within the right anterior cerebral artery territory. No acute or chronic hemorrhage. There is multifocal hyperintense T2-weighted signal within the white matter. Generalized volume loss. Old right basal ganglia small vessel infarct. The midline structures are normal. Vascular: Major flow voids are preserved. Skull and upper cervical spine: Normal calvarium and skull base. Visualized upper cervical spine and soft tissues are normal. Sinuses/Orbits:No paranasal sinus fluid levels or advanced mucosal thickening. No mastoid or middle ear effusion. Normal orbits. IMPRESSION: Intermediate sized area of acute ischemia within the right anterior cerebral artery territory. No hemorrhage or mass effect. Electronically Signed   By: Deatra Robinson M.D.   On: 12/21/2022 23:15   CT HEAD WO CONTRAST  Result Date: 12/21/2022 CLINICAL DATA:  Neuro deficit.  Acute stroke suspected. EXAM: CT HEAD WITHOUT CONTRAST TECHNIQUE: Contiguous axial images were obtained from the base of the skull through the vertex without intravenous contrast. RADIATION DOSE REDUCTION: This exam was performed according to the departmental dose-optimization program which includes automated exposure control, adjustment of the mA and/or kV according to patient size and/or use of iterative reconstruction technique. COMPARISON:  01/26/2017  FINDINGS: Brain: There is significant central and cortical atrophy, consistent with small vessel disease and similar to prior study. There is no intra or extra-axial fluid collection or mass lesion. The basilar cisterns and ventricles have a normal appearance. There is no CT evidence for acute infarction or hemorrhage. There is interval but remote infarction of the RIGHT external capsule. There is no intra or extra-axial fluid collection or mass lesion. The basilar cisterns and ventricles have a normal appearance. There is no CT evidence for acute infarction or hemorrhage. Vascular: There is atherosclerotic calcification of the internal carotid arteries. No hyperdense vessels. Skull: Normal. Negative for fracture or focal lesion. Sinuses/Orbits: No acute finding. Other: None. IMPRESSION: 1. Atrophy and small vessel disease. 2. Interval but remote infarction of the RIGHT external capsule. 3. No evidence for acute intracranial abnormality. Electronically Signed   By: Norva Pavlov M.D.   On: 12/21/2022 18:18    PHYSICAL EXAM General: Sitting up in bed; in no acute distress.  HENT: Normal oropharynx and mucosa. Normal external appearance of ears and nose.  Neck: Supple, no pain or tenderness  CV: No JVD. No peripheral edema.  Pulmonary: Symmetric Chest rise. Normal respiratory effort.  Abdomen: Soft to touch, non-tender.  Ext: No cyanosis, edema, or deformity  Skin: No rash. Normal palpation of skin.   Musculoskeletal: Normal digits and nails by inspection.  NEURO:  Mental Status: Alert and oriented to self, place, month, year, situation.  Able to give recent and remote history. Speech: Fluent with intact comprehension object naming and repetition. CN: No visual field deficits, EOMI, no nystagmus, facial movement and sensation intact bilaterally, hard of hearing at baseline, symmetric head turn and shoulder shrug, midline tongue protrusion Motor: Spontaneous movement of all extremities with no drift  noted left upper leg 4+ out of 5 Sensation: Bilaterally intact throughout Coordination: Finger-to-nose intact.  Heel-to-shin intact; able to perform on left side slow but intact.  Rapid alternating movement intact bilaterally  ASSESSMENT/PLAN Tyler Guerra. is a 72 y.o. male with history of hearing loss, DM 2, GERD, hypertension, CAD, TIAs who presented 5/20 to ED with left leg weakness x 3 weeks, bedbound since 5/16.  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  Patient previously on aspirin and Plavix, recommend aspirin and Brilinta due to stent. Plan of care reviewed with patient and family at bedside.  Recommend loop recorder on discharge, EPS aware.   Right ACA Infarct  Etiology:  Probable embolic secondary to risk factors of previous TIAs, hypertension, coronary artery disease, diabetes type 2, obesity, family history of stroke CT head  No acute abnormality.  Atrophy and small vessel disease CTA head & neck  No emergent LVO  MRI   Acute ischemia within right ACA territory  2D Echo: LVEF 55 to 60%, mild concentric LVH, grade 1 diastolic dysfunction, moderate hypokinesis of left ventricular wall, no shunt.  Findings consistent with echo done in 2022 Recommend loop recorder on discharge  LDL 66 HgbA1c 6.6 VTE prophylaxis - lovenox    Diet   Diet heart healthy/carb modified Room service appropriate? Yes; Fluid consistency: Thin   aspirin 81 mg daily and clopidogrel 75 mg daily prior to admission, now on aspirin and clopidogrel daily. Recommend changing to aspirin 81 mg daily and Brilinta (ticagrelor) 90 mg bid if copay is affordable.  Therapy recommendations:  pending Disposition:  pending  CAD, s/p CABG x4 with stent Coronary artery disease Hypertension Home meds: Lopressor 25 mg Permissive hypertension (OK if < 220/120) but gradually normalize in 2-3 days Long-term BP goal normotensive Currently on aspirin and plavix. Recommend switching to aspirin and  Brilinta as patient has had strokes now while on Plavix but will need further anticoagulation due to stent. Home antihypertensives on hold Denies chest pain, lightheadedness, SOB  Hyperlipidemia Home meds: Lipitor 80 mg, resumed in hospital LDL 66, goal < 70 Continue statin at discharge  Diabetes type II, Pre-Diabetic Home meds:  none HgbA1c 6.6, goal < 7.0 CBGs Recent Labs    12/22/22 0618 12/22/22 0805 12/22/22 1238  GLUCAP 141* 110* 163*    SSI Recommend close PCP follow-up as A1c is within prediabetic range  Other Stroke Risk Factors Advanced Age >/= 65  Obesity, Body mass index is 32.49 kg/m., BMI >/= 30 associated with increased stroke risk, recommend weight loss, diet and exercise as appropriate  Hx stroke/TIA Family hx stroke (father)  Other Active Problems GERD BPH Anxiety/Depression Home lexapro resumed  Hospital day # 0   Pt seen by Neuro NP/APP and later by MD. Note/plan to be edited by MD as needed.  Lynnae January, DNP, AGACNP-BC [facility] Please use AMION for contact information & EPIC for messaging.  STROKE MD NOTE : I have personally obtained history,examined this patient, reviewed notes, independently viewed imaging studies, participated in medical decision making and plan of care.ROS completed by me personally and pertinent positives fully documented  I have made any additions or clarifications directly to the above note. Agree with note above.  Patient presented with subacute left leg weakness and falls but did not choose medical help until recently.  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  Recommend dual antiplatelet therapy aspirin and Brilinta for long-term given history of cardiac stent if patient can afford the Brilinta co-pay.  Aggressive risk factor modification.  Loop recorder prior to discharge to look for paroxysmal A-fib.  Follow-up as outpatient stroke clinic in 2 months.  Discussed with patient and  family, Dr. Marland Mcalpine and EP team.  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 After hours, contact General Neurology

## 2022-12-22 NOTE — Consult Note (Addendum)
ELECTROPHYSIOLOGY CONSULT NOTE  Patient ID: Tyler Guerra. MRN: [id number], DOB/AGE: 09/14/1950   Admit date: 12/21/2022 Date of Consult: 12/22/2022  Primary Physician: Joaquim Nam, MD Primary Cardiologist: Lesleigh Noe, MD (Inactive)  Primary Electrophysiologist: New to Dr. Ladona Ridgel   Reason for Consultation: Cryptogenic stroke; recommendations regarding Implantable Loop Recorder Insurance: Medicare  History of Present Illness EP has been asked to evaluate Tyler Guerra. for placement of an implantable loop recorder to monitor for atrial fibrillation by Dr Pearlean Brownie.  Tyler Guerra. is a 72 y.o. male with h/o HTN, TIAs, CAD s/p CABG, DM2, and obesity.     The patient was admitted on 12/21/2022 with left leg weakness x weeks, bed bound since 5/16.  Imaging demonstrated Right ACA Infarct due to R A1 occlusion.    He has undergone workup for stroke including:  Code Stroke CT head - atrophy and small vessel disease, Interval but remote infarction of the RIGHT external capsule CTA No emergent large vessel occlusion or hemodynamically significant stenosis of the head or neck.  MRI Intermediate sized area of acute ischemia within the right anterior cerebral artery territory. No hemorrhage or mass effec 2D Echo - LVEF 55-60%, Grade 1 DD, normal atrial size No carotids this admission, 1-39% bilateral stenosis 2018 No plans for TEE given age LDL 66 HgbA1c 6.6 Therapy recommendations:  Outpatient PT Disposition:  Home today    The patient has been monitored on telemetry which has demonstrated sinus rhythm with no arrhythmias.  Inpatient stroke work-up will not require a TEE per Neurology.   Echocardiogram as above. Lab work is reviewed.  Prior to admission, the patient denies chest pain, shortness of breath, dizziness, palpitations, or syncope.  He is recovering from his stroke with plans to return home  at discharge.  Allergies, Past Medical, Surgical, Social, and  Family Histories have been reviewed and are referenced here-in when relevant for medical decision making.   Inpatient Medications:   aspirin EC  81 mg Oral Daily   atorvastatin  80 mg Oral Daily   clopidogrel  75 mg Oral Daily   enoxaparin (LOVENOX) injection  40 mg Subcutaneous Daily   escitalopram  5 mg Oral Daily   insulin aspart  0-9 Units Subcutaneous Q4H   pantoprazole  40 mg Oral Daily   sodium chloride flush  3 mL Intravenous Once   tamsulosin  0.4 mg Oral BID    Physical Exam: Vitals:   12/22/22 0854 12/22/22 1145 12/22/22 1240 12/22/22 1315  BP:  116/80  102/84  Pulse:  69  84  Resp:  12  12  Temp: 98.1 F (36.7 C)  98.1 F (36.7 C)   TempSrc: Oral  Oral   SpO2:  94%  96%  Weight:      Height:        GEN- NAD. A&O x 3. Normal affect. HEENT: Normocephalic, atraumatic Lungs- CTAB, Normal effort.  Heart- Regular rate and rhythm rate and rhythm. No M/G/R.  Extremities- No peripheral edema. no clubbing or cyanosis Skin- warm and dry, no rash or lesion. Neuro - 4+/5 LLE per Neuro note.    12-lead ECG on arrival showed NSR (personally reviewed) All prior EKG's in EPIC reviewed with no documented atrial fibrillation  Telemetry - NSR 60-70s (personally reviewed)  Assessment and Plan:  1. Cryptogenic stroke The patient presents with cryptogenic stroke.  The patient does not have a TEE planned for this AM.  I spoke at  length with the patient about monitoring for afib with an implantable loop recorder.  Risks, benefits, and alteratives to implantable loop recorder were discussed with the patient today.   At this time, the patient is very clear in their decision to proceed with implantable loop recorder.   Wound care was reviewed with the patient (keep incision clean and dry for 3 days). Please call with questions.   Graciella Freer, PA-C 12/22/2022 2:23 PM  EP Attending  Patient seen and examined. Agree with the findings as noted above. The patient presents  after sustaining a stroke perhaps 2 weeks ago. He has undergone workup which demonstrated a right ACA infarct concerning for embolic phenomena. He is referred to consider ILR insertion.  A/P Cryptogenic stroke - I have reviewed the rationale for ILR insertion and he wishes to proceed. No evidence of prior atrial fib.   Sharlot Gowda Langford Carias,MD \

## 2022-12-22 NOTE — Consult Note (Signed)
NEUROLOGY CONSULTATION NOTE   Date of service: Dec 22, 2022 Patient Name: Tyler Guerra. MRN:  [id number] DOB:  [birth year] Reason for consult: "L leg weakness, falls" Requesting Provider: Darlin Drop, DO _ _ _   _ __   _ __ _ _  __ __   _ __   __ _  History of Present Illness  Tyler Guerra. is a 72 y.o. male with PMH significant for hearing loss, DM2, GERD, HTN, CAD, TIAs who presents with about 3 weeks hx of L leg weakness and trouble with walking and leg giving out. Wife eventually brought him in and workup with MRI Brain demonstrates a moderate size R ACA territory infarct.  Reports L leg has been giving trouble for the last 3 weeks, worsened since 12/17/22 and has been bed bound since 5/16. They were not able to get in with neurology until June and thus decided to come to the ED.  LKW: 12/01/22 mRS: 0 tNKASE: not offered, outside window Thrombectomy: not offered, outside window NIHSS components Score: Comment  1a Level of Conscious 0[x]  1[]  2[]  3[]      1b LOC Questions 0[x]  1[]  2[]       1c LOC Commands 0[x]  1[]  2[]       2 Best Gaze 0[x]  1[]  2[]       3 Visual 0[x]  1[]  2[]  3[]      4 Facial Palsy 0[x]  1[]  2[]  3[]      5a Motor Arm - left 0[x]  1[]  2[]  3[]  4[]  UN[]    5b Motor Arm - Right 0[x]  1[]  2[]  3[]  4[]  UN[]    6a Motor Leg - Left 0[x]  1[]  2[]  3[]  4[]  UN[]    6b Motor Leg - Right 0[x]  1[]  2[]  3[]  4[]  UN[]    7 Limb Ataxia 0[]  1[x]  2[]  3[]  UN[]     8 Sensory 0[x]  1[]  2[]  UN[]      9 Best Language 0[x]  1[]  2[]  3[]      10 Dysarthria 0[x]  1[]  2[]  UN[]      11 Extinct. and Inattention 0[x]  1[]  2[]       TOTAL: 1       ROS   Constitutional Denies weight loss, fever and chills.   HEENT Denies changes in vision and hearing.   Respiratory Denies SOB and cough.   CV Denies palpitations and CP   GI Denies abdominal pain, nausea, vomiting and diarrhea.   GU Denies dysuria and urinary frequency.   MSK Denies myalgia and joint pain.   Skin Denies rash and pruritus.    Neurological Denies headache and syncope.   Psychiatric Denies recent changes in mood. Denies anxiety and depression.    Past History   Past Medical History:  Diagnosis Date   Anxiety state 03/16/2016   Aphasia 01/26/2017   Benign localized prostatic hyperplasia with lower urinary tract symptoms (LUTS)    BPV (benign positional vertigo) 11/02/2014   Carotid artery stenosis    PER DUPLEX 10-22-2015  BILATERAL ICA 1-39%   Coronary artery disease    CARDIOLOGIST-  DR Verdis Prime--  VISIT EVERY OTHER YEAR-- LOV 2015   Coronary atherosclerosis 07/19/2010   Qualifier: Diagnosis of  By: Para March MD, Graham     Depression 01/26/2017   Diabetes mellitus without complication (HCC) 03/02/2018   GERD 07/19/2010   Qualifier: Diagnosis of  By: Para March MD, Cheree Ditto     GERD (gastroesophageal reflux disease)    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   Gout    Gout 07/18/2010   Qualifier: Diagnosis of  By:  Para March MD, Cheree Ditto     H/O eye injury    chronic changes to left eye after injury   Hand weakness 03/02/2018   Hearing loss 04/21/2013   History of TIA (transient ischemic attack)    03/ 2017  no residual after brief episode loss peripheral vision   HOH (hard of hearing)    Hyperglycemia 05/17/2016   Hyperlipemia    Hyperlipidemia 07/18/2010   Qualifier: Diagnosis of  By: Para March MD, Cheree Ditto     Hypertension    HYPERTENSION, BENIGN ESSENTIAL 07/18/2010   Qualifier: Diagnosis of  By: Para March MD, Graham     Hypokalemia 01/26/2017   Knee pain 03/13/2019   Left shoulder pain 09/04/2018   Pneumothorax 10/11/2019   RIGHT    Prostate cancer (HCC) UROLOGIST-  DR GRAPEY/  ONCOLOGIST-  DR MANNING   dx 2015--- Stage T1c, Gleason 3+4, PSA 4.03, vol 44cc   PVC's (premature ventricular contractions)    Rash and nonspecific skin eruption 10/14/2015   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   S/P CABG x 05 Dec 1998   Skin lesion 10/14/2015   TIA (transient ischemic attack) 10/14/2015   Ventral hernia 10/26/2011    Wears glasses    Past Surgical History:  Procedure Laterality Date   APPENDECTOMY  1975   CARDIAC CATHETERIZATION  12-22-2001   dr Verdis Prime   severe native vessel disease mLAD 60-70%,  total occulsion pCFX  and pRCA/  widely patent saphenous vein , free radial , and LIMA grafts/  minminal lv dysfunction, ef 60%   CHEST TUBE INSERTION Right 10/11/2019   CORONARY ARTERY BYPASS GRAFT  May 2000   LIMA to LAD,  SVG to PDA and Diagonal, Free radial graft to OM   CORONARY STENT INTERVENTION N/A 05/26/2021   Procedure: CORONARY STENT INTERVENTION;  Surgeon: Orbie Pyo, MD;  Location: [facility];  Service: Cardiovascular;  Laterality: N/A;   Exericse treadmill test  last one 01-03-2014  dr Verdis Prime   normal exercise tolerance w/ hypertensive repsonse,  no ischemic EKG changes, appropriate HR response & recovery (Duke TM score 9;  Low Risk , PVC's w/ exertion)   LEFT HEART CATH AND CORS/GRAFTS ANGIOGRAPHY N/A 05/26/2021   Procedure: LEFT HEART CATH AND CORS/GRAFTS ANGIOGRAPHY;  Surgeon: Orbie Pyo, MD;  Location: [facility];  Service: Cardiovascular;  Laterality: N/A;   PROSTATE BIOPSY     RADIOACTIVE SEED IMPLANT N/A 01/13/2016   Procedure: RADIOACTIVE SEED IMPLANT/BRACHYTHERAPY IMPLANT;  Surgeon: Barron Alvine, MD;  Location: [facility];  Service: Urology;  Laterality: N/A;   Family History  Problem Relation Age of Onset   Cancer Father        prostate in eighties   Prostate cancer Father    Heart disease Father    Stroke Father    Dementia Mother    Cancer Sister        breast   Cancer Paternal Uncle        prostate   Colon cancer Neg Hx    Social History   Socioeconomic History   Marital status: Married    Spouse name: Not on file   Number of children: 2   Years of education: Not on file   Highest education level: Not on file  Occupational History   Occupation: Airline pilot REP    Employer: [employer] [place]    Comment:  [employer], travelling 3-4 nights a week  Tobacco Use  Smoking status: Never   Smokeless tobacco: Never  Vaping Use   Vaping Use: Never used  Substance and Sexual Activity   Alcohol use: No   Drug use: No   Sexual activity: Yes  Other Topics Concern   Not on file  Social History Narrative   Married 1974   2 daughters and 2 grandkids   Retired 2018.   Social Determinants of Health   Financial Resource Strain: Low Risk  (12/19/2021)   Overall Financial Resource Strain (CARDIA)    Difficulty of Paying Living Expenses: Not hard at all  Food Insecurity: No Food Insecurity (12/19/2021)   Hunger Vital Sign    Worried About Running Out of Food in the Last Year: Never true    Ran Out of Food in the Last Year: Never true  Transportation Needs: No Transportation Needs (12/19/2021)   PRAPARE - Administrator, Civil Service (Medical): No    Lack of Transportation (Non-Medical): No  Physical Activity: Inactive (12/19/2021)   Exercise Vital Sign    Days of Exercise per Week: 0 days    Minutes of Exercise per Session: 0 min  Stress: No Stress Concern Present (12/19/2021)   [employer] of Occupational Health - Occupational Stress Questionnaire    Feeling of Stress : Not at all  Social Connections: Not on file   No Known Allergies  Medications  (Not in a hospital admission)    Vitals   Vitals:   12/21/22 2318 12/21/22 2319 12/21/22 2330 12/21/22 2345  BP: 129/86  (!) 149/83 (!) 143/96  Pulse: 72 78 72 70  Resp: 13 13 18 17   Temp:      TempSrc:      SpO2: 96% 96% 97% 98%  Weight:      Height:         Body mass index is 32.49 kg/m.  Physical Exam   General: Laying comfortably in bed; in no acute distress.  HENT: Normal oropharynx and mucosa. Normal external appearance of ears and nose.  Neck: Supple, no pain or tenderness  CV: No JVD. No peripheral edema.  Pulmonary: Symmetric Chest rise. Normal respiratory effort.  Abdomen: Soft to touch, non-tender.   Ext: No cyanosis, edema, or deformity  Skin: No rash. Normal palpation of skin.   Musculoskeletal: Normal digits and nails by inspection. No clubbing.   Neurologic Examination  Mental status/Cognition: Alert, oriented to self, place, month and year, good attention.  Speech/language: Fluent, comprehension intact, object naming intact, repetition intact.  Cranial nerves:   CN II Pupils equal and reactive to light, no VF deficits    CN III,IV,VI EOM intact, no gaze preference or deviation, no nystagmus    CN V normal sensation in V1, V2, and V3 segments bilaterally    CN VII no asymmetry, no nasolabial fold flattening    CN VIII normal hearing to speech    CN IX & X normal palatal elevation, no uvular deviation    CN XI 5/5 head turn and 5/5 shoulder shrug bilaterally    CN XII midline tongue protrusion    Motor:  Muscle bulk: normal, tone normal, pronator drift none tremor none Mvmt Root Nerve  Muscle Right Left Comments  SA C5/6 Ax Deltoid 5 5   EF C5/6 Mc Biceps 5 5   EE C6/7/8 Rad Triceps 5 5   WF C6/7 Med FCR     WE C7/8 PIN ECU     F Ab C8/T1 U ADM/FDI 5 5  HF L1/2/3 Fem Illopsoas 5 4+   KE L2/3/4 Fem Quad 5 5   DF L4/5 D Peron Tib Ant 5 5   PF S1/2 Tibial Grc/Sol 5 5    Sensation:  Light touch Intact throughout   Pin prick    Temperature    Vibration   Proprioception    Coordination/Complex Motor:  - Finger to Nose intact BL - Heel to shin with ataxia in LLE - Rapid alternating movement intact BL - Gait: deferred for patient safety.,  Labs   CBC:  Recent Labs  Lab 12/21/22 1636 12/21/22 1647  WBC 6.2  --   NEUTROABS 4.2  --   HGB 13.2 13.3  HCT 40.4 39.0  MCV 91.2  --   PLT 182  --     Basic Metabolic Panel:  Lab Results  Component Value Date   NA 140 12/21/2022   K 3.9 12/21/2022   CO2 26 12/21/2022   GLUCOSE 168 (H) 12/21/2022   BUN 27 (H) 12/21/2022   CREATININE 1.00 12/21/2022   CALCIUM 9.0 12/21/2022   GFRNONAA >60 12/21/2022   GFRAA  >60 10/12/2019   Lipid Panel:  Lab Results  Component Value Date   LDLCALC 49 11/26/2022   HgbA1c:  Lab Results  Component Value Date   HGBA1C 7.0 (H) 11/26/2022   Urine Drug Screen:     Component Value Date/Time   LABOPIA NONE DETECTED 01/26/2017 2145   COCAINSCRNUR NONE DETECTED 01/26/2017 2145   LABBENZ NONE DETECTED 01/26/2017 2145   AMPHETMU NONE DETECTED 01/26/2017 2145   THCU NONE DETECTED 01/26/2017 2145   LABBARB NONE DETECTED 01/26/2017 2145    Alcohol Level     Component Value Date/Time   ETH <10 12/21/2022 1632    CT Head without contrast(Personally reviewed): CTH was negative for a large hypodensity concerning for a large territory infarct or hyperdensity concerning for an ICH  CT angio Head and Neck with contrast(Personally reviewed): pending  MRI Brain(Personally reviewed): R ACA infarct  Impression   Romello Spielberger. is a 72 y.o. male with PMH significant for hearing loss, DM2, GERD, HTN, CAD, TIAs who presents with about 3 weeks hx of L leg weakness and trouble with walking and leg giving out. Wife eventually brought him in and workup with MRI Brain demonstrates a moderate size R ACA territory infarct.[name] His neurologic examination is notable for mild LLE weakness and ataxia.  Etiology of stroke is unclear and pending full stroke workup at this time.   Recommendations  - Frequent Neuro checks per stroke unit protocol - Recommend Vascular imaging with CTA head and neck - Recommend obtaining TTE - Recommend obtaining Lipid panel with LDL - Please start statin if LDL > 70 - Recommend HbA1c to evaluate for diabetes and how well it is controlled. - Antithrombotic - Aspirin 81mg  daily along with plavix 75g daily x 21 days, followed by Plavix 75mg  daily alone. - Recommend DVT ppx - SBP goal - aim for gradual normotension. - Recommend Telemetry monitoring for arrythmia - Recommend bedside swallow screen prior to PO intake. - Stroke education booklet -  Recommend PT/OT/SLP consult - counseled him on the importance of coming to the ED right away at the first sign of stroke.  ______________________________________________________________________   Thank you for the opportunity to take part in the care of this patient. If you have any further questions, please contact the neurology consultation attending.  Signed,  Erick Blinks [facility] Pager Number [phone] _ _ _  _ __   _ __ _ _  __ __   _ __   __ _  

## 2022-12-22 NOTE — TOC Transition Note (Signed)
Transition of Care [facility]) - CM/SW Discharge Note   Patient Details  Name: Tyler Guerra. MRN: [id number] Date of Birth: [birth year]  [facility]) CM/SW Contact:  Kermit Balo, RN Phone Number: 12/22/2022, 5:13 PM   Clinical Narrative:    Pt is discharging home with outt theray through [facility] outpt.  Walker for home ordered through [employer] and will be delivered to the room. Pt has supervision at home and transportation to home.   Final next level of care: OP Rehab Barriers to Discharge: No Barriers Identified   Patient Goals and CMS Choice CMS Medicare.gov Compare Post Acute Care list provided to:: Patient Choice offered to / list presented to : Patient, Spouse  Discharge Placement                         Discharge Plan and Services Additional resources added to the After Visit Summary for                  DME Arranged: Walker rolling DME Agency: [employer] Date DME Agency Contacted: 12/22/22   Representative spoke with at DME Agency: Ledell Noss            Social Determinants of Health (SDOH) Interventions SDOH Screenings   Food Insecurity: No Food Insecurity (12/19/2021)  Transportation Needs: No Transportation Needs (12/19/2021)  Depression (PHQ2-9): Low Risk  (11/26/2022)  Financial Resource Strain: Low Risk  (12/19/2021)  Physical Activity: Inactive (12/19/2021)  Stress: No Stress Concern Present (12/19/2021)  Tobacco Use: Low Risk  (12/21/2022)     Readmission Risk Interventions     No data to display

## 2022-12-22 NOTE — Progress Notes (Signed)
Patient back on unit from loop recorder placement. Scant amount of drainage on dressing. Bed in low position and alarm activated with family at bedside.  Melony Overly, RN

## 2022-12-22 NOTE — Progress Notes (Signed)
1500 Pt has arrived to room 3W-21 from ED. Pt is A&Ox4, on room air. Family at bedside. Denies the presence of dizziness and headache. Bed in lowest position, bed alarm on. Call light within reach.   1520 consent signed for loop recorder implantation. Family at bedside and aware of plan of care.   1528 Pt has left floor with cath lab RN for loop recorder placement.

## 2022-12-23 ENCOUNTER — Encounter (HOSPITAL_COMMUNITY): Payer: Self-pay | Admitting: Internal Medicine

## 2023-01-04 ENCOUNTER — Telehealth: Payer: Self-pay | Admitting: Pharmacist

## 2023-01-04 ENCOUNTER — Ambulatory Visit: Payer: Medicare Other | Admitting: Physical Therapy

## 2023-01-04 DIAGNOSIS — I2510 Atherosclerotic heart disease of native coronary artery without angina pectoris: Secondary | ICD-10-CM

## 2023-01-04 DIAGNOSIS — G459 Transient cerebral ischemic attack, unspecified: Secondary | ICD-10-CM

## 2023-01-04 NOTE — Therapy (Signed)
[facility] Outpatient Rehabilitation at [facility] [address] [place], [place], [address] Phone: [phone]   Fax:  [phone]  Patient Details  Name: Tyler Guerra. MRN: [id number] Date of Birth: February 26, 1951 Referring Provider:  Merlene Laughter, DO  Encounter Date: 01/04/2023  Patient arriving with new order regarding an acute CVA.  Patient's wife and daughter present.  Recommended referral to Neuro Rehab in [place] at this time.     Erendida Wrenn, [place], PT 01/04/2023, 1:20 PM  [facility] Outpatient Rehabilitation at [facility] [address] [place], [place], [address] Phone: [phone]   Fax:  [phone]

## 2023-01-04 NOTE — Telephone Encounter (Signed)
PharmD reviewed patient chart to assess eligibility for [facility] Care Management and Coordination services. Patient was determined to be a good candidate for the program given the complexity of the medication regimen and/or overall risk for hospitalization and increased utilization.  Referral entered in order to outreach patient and offer appointment with PharmD. Referral cosigned to PCP.  

## 2023-01-08 ENCOUNTER — Telehealth: Payer: Self-pay

## 2023-01-08 ENCOUNTER — Other Ambulatory Visit: Payer: Self-pay | Admitting: Physician Assistant

## 2023-01-08 NOTE — Progress Notes (Signed)
Care Management & Coordination Services Pharmacy Team  Reason for Encounter: Initial Appointment Reminder  Contacted patient to confirm in office appointment with Al Corpus, PharmD on 01/11/2023 at 9:00.  Unsuccessful outreach. Left voicemail for patient to return call.  Chart review:  Recent office visits:  11/26/22 Crawford Givens, MD DM A1c - 7.0 No med changes No F/U  Recent consult visits:  None in past six months  Hospital visits:  Medication Reconciliation was completed by comparing discharge summary, patient's EMR and Pharmacy list, and upon discussion with patient.  Admitted to the hospital on 12/21/2022 due to Acute CVA. Discharge date was 12/22/2022. Discharged from [facility].    New?Medications Started at [facility] Discharge:?? BRILINTA 90 MG TABS  MIRALAX 17 GM/SCOOP powder   Medications Discontinued at Hospital Discharge: Clopidogrel 75 MG tablet  Medications that remain the same after Hospital Discharge:??  -All other medications will remain the same.    Star Rating Drugs:  Medication:   Last Fill: Day Supply Atorvastatin 80 mg  10/26/2022 90 Losartan-HCTZ 50-12.5 mg 11/03/2022 90  Care Gaps: Annual wellness visit in last year? No 12/19/2021  If Diabetic: Last eye exam / retinopathy screening:Overdue Last diabetic foot exam: Up to date  Al Corpus, PharmD notified  Claudina Lick, [place] Clinical Pharmacy Assistant [phone]

## 2023-01-11 ENCOUNTER — Ambulatory Visit: Payer: Medicare Other | Admitting: Pharmacist

## 2023-01-11 DIAGNOSIS — K219 Gastro-esophageal reflux disease without esophagitis: Secondary | ICD-10-CM

## 2023-01-11 DIAGNOSIS — I639 Cerebral infarction, unspecified: Secondary | ICD-10-CM

## 2023-01-11 DIAGNOSIS — I10 Essential (primary) hypertension: Secondary | ICD-10-CM

## 2023-01-11 MED ORDER — TICAGRELOR 90 MG PO TABS
90.0000 mg | ORAL_TABLET | Freq: Two times a day (BID) | ORAL | 0 refills | Status: DC
Start: 2023-01-11 — End: 2023-03-22

## 2023-01-11 MED ORDER — PANTOPRAZOLE SODIUM 40 MG PO TBEC
40.0000 mg | DELAYED_RELEASE_TABLET | Freq: Every day | ORAL | 0 refills | Status: DC
Start: 2023-01-11 — End: 2023-03-22

## 2023-01-11 NOTE — Progress Notes (Unsigned)
Care Management & Coordination Services Pharmacy Note  01/11/2023 Name:  Tyler Guerra. MRN:  [id number] DOB:  05-Apr-1951  Summary: ***  Recommendations/Changes made from today's visit: -Monitor BP at home daily -Make appt with cardiology  Follow up plan: ***   Subjective: Tyler Guerra. is an 72 y.o. year old Tyler who is a primary patient of Para March, Dwana Curd, MD.  The care coordination team was consulted for assistance with disease management and care coordination needs.    Engaged with patient face to face for initial visit.  Recent office visits: 11/26/22 Dr Para March OV: extremity weakness. Referred to PT  Recent consult visits: 01/01/22 Dr Katrinka Blazing (Cardiology): CAD. Consider SGTL2 in future.   Hospital visits: 12/21/22 - 12/22/22 Admission [facility]): Acute CVA. Changed to aspirin + Brilinta. Inserted loop recorder. Resume HTN meds at discharge. K 3.4, repleted. Repeat CMP 1 week.    Objective:  Lab Results  Component Value Date   CREATININE 1.01 12/22/2022   BUN 22 12/22/2022   GFR 76.03 11/26/2022   EGFR 72 10/22/2021   GFRNONAA >60 12/22/2022   GFRAA >60 10/12/2019   NA 138 12/22/2022   K 3.4 (L) 12/22/2022   CALCIUM 8.9 12/22/2022   CO2 25 12/22/2022   GLUCOSE 132 (H) 12/22/2022    Lab Results  Component Value Date/Time   HGBA1C 6.6 (H) 12/22/2022 01:21 AM   HGBA1C 7.0 (H) 11/26/2022 12:34 PM   GFR 76.03 11/26/2022 12:34 PM   GFR 76.28 10/01/2020 09:16 AM   MICROALBUR <0.7 12/02/2022 02:01 PM   MICROALBUR 0.7 10/01/2020 09:16 AM    Last diabetic Eye exam: No results found for: "HMDIABEYEEXA"  Last diabetic Foot exam: No results found for: "HMDIABFOOTEX"   Lab Results  Component Value Date   CHOL 128 12/22/2022   HDL 42 12/22/2022   LDLCALC 66 12/22/2022   LDLDIRECT 97.0 10/01/2020   TRIG 99 12/22/2022   CHOLHDL 3.0 12/22/2022       Latest Ref Rng & Units 12/22/2022   11:30 AM 12/21/2022    4:36 PM 11/26/2022   12:34 PM  Hepatic Function   Total Protein 6.5 - 8.1 g/dL 6.5  7.0  6.8   Albumin 3.5 - 5.0 g/dL 3.5  3.9  4.1   AST 15 - 41 U/L 18  20  14    ALT 0 - 44 U/L 15  15  13    Alk Phosphatase 38 - 126 U/L 101  110  115   Total Bilirubin 0.3 - 1.2 mg/dL 1.1  1.5  0.8     Lab Results  Component Value Date/Time   TSH 1.32 11/26/2022 12:34 PM       Latest Ref Rng & Units 12/22/2022   11:30 AM 12/21/2022    4:47 PM 12/21/2022    4:36 PM  CBC  WBC 4.0 - 10.5 K/uL 6.0   6.2   Hemoglobin 13.0 - 17.0 g/dL 40.9  81.1  91.4   Hematocrit 39.0 - 52.0 % 34.6  39.0  40.4   Platelets 150 - 400 K/uL 164   182    Iron/TIBC/Ferritin/ %Sat No results found for: "IRON", "TIBC", "FERRITIN", "IRONPCTSAT"  No results found for: "VD25OH", "VITAMINB12"  Clinical ASCVD: Yes  The ASCVD Risk score (Arnett DK, et al., 2019) failed to calculate for the following reasons:   The patient has a prior MI or stroke diagnosis        11/26/2022   11:35 AM 12/19/2021    3:33  PM 03/01/2018   12:56 PM  Depression screen PHQ 2/9  Decreased Interest 0 0 0  Down, Depressed, Hopeless 0 0 0  PHQ - 2 Score 0 0 0  Altered sleeping 0  0  Tired, decreased energy 2  0  Change in appetite 0  0  Feeling bad or failure about yourself  0  0  Trouble concentrating 0  0  Moving slowly or fidgety/restless 0  0  Suicidal thoughts 0  0  PHQ-9 Score 2  0  Difficult doing work/chores Not difficult at all  Not difficult at all     Social History   Tobacco Use  Smoking Status Never  Smokeless Tobacco Never   BP Readings from Last 3 Encounters:  12/22/22 132/69  11/26/22 104/62  03/04/22 104/60   Pulse Readings from Last 3 Encounters:  12/22/22 76  11/26/22 72  03/04/22 63   Wt Readings from Last 3 Encounters:  12/22/22 218 lb 11.1 oz (99.2 kg)  11/26/22 221 lb (100.2 kg)  03/04/22 217 lb 8 oz (98.7 kg)   BMI Readings from Last 3 Encounters:  12/22/22 31.38 kg/m  11/26/22 32.64 kg/m  03/04/22 32.12 kg/m    No Known  Allergies  Medications Reviewed Today     Reviewed by Estil Daft, CPhT (Pharmacy Technician) on 12/22/22 at 1007  Med List Status: Complete   Medication Order Taking? Sig Documenting Provider Last Dose Status Informant  allopurinol (ZYLOPRIM) 300 MG tablet [id number] Yes TAKE 1 TABLET DAILY  Patient taking differently: Take 300 mg by mouth daily.   Joaquim Nam, MD 12/21/2022 Active Self, Family Member, Pharmacy Records  aspirin EC 81 MG tablet [id number] Yes Take 1 tablet (81 mg total) by mouth daily. Lyn Records, MD 12/21/2022 Active Self, Family Member, Pharmacy Records  atorvastatin (LIPITOR) 80 MG tablet [id number] Yes TAKE 1 TABLET DAILY Dyann Kief, PA-C 12/21/2022 Active Self, Family Member, Pharmacy Records  Cholecalciferol (VITAMIN D PO) [id number] Yes Take 1 capsule by mouth daily. [provider] 12/21/2022 Active Self, Family Member, Pharmacy Records  clopidogrel (PLAVIX) 75 MG tablet [id number] Yes Take 1 tablet (75 mg total) by mouth daily with breakfast. Lyn Records, MD 12/21/2022 0900 Active Self, Family Member, Pharmacy Records  escitalopram (LEXAPRO) 5 MG tablet [id number] Yes TAKE 1 TABLET DAILY  Patient taking differently: Take 5 mg by mouth daily.   Joaquim Nam, MD 12/21/2022 Active Self, Family Member, Pharmacy Records  losartan-hydrochlorothiazide [facility]) 50-12.5 MG tablet [id number] Yes Take 1 tablet by mouth daily. Dyann Kief, PA-C 12/21/2022 Active Self, Family Member, Pharmacy Records  metoprolol tartrate (LOPRESSOR) 25 MG tablet [id number] Yes TAKE ONE-HALF (1/2) TABLET TWICE A DAY  Patient taking differently: Take 12.5 mg by mouth 2 (two) times daily.   Lyn Records, MD 12/21/2022 0900 Active Self, Family Member, Pharmacy Records  Misc Natural Products (BLACK CHERRY CONCENTRATE PO) [id number] Yes Take 1 tablet by mouth at bedtime. [provider] Unknown Active Self, Family Member, Pharmacy Records  Multiple Vitamins-Minerals  (OCUVITE PO) [id number] Yes Take 1 capsule by mouth daily. [provider] 12/21/2022 Active Self, Family Member, Pharmacy Records  nitroGLYCERIN (NITROSTAT) 0.4 MG SL tablet [id number] Yes Place 1 tablet (0.4 mg total) under the tongue every 5 (five) minutes as needed for chest pain. Corrin Parker, PA-C Unknown Active Self, Family Member, Pharmacy Records  pantoprazole (PROTONIX) 40 MG tablet [id number] No TAKE 1 TABLET DAILY  Patient not taking: Reported on 12/22/2022  Corrin Parker, PA-C Not Taking Active Self, Family Member, Pharmacy Records  Propylene Glycol (SYSTANE BALANCE) 0.6 % SOLN [id number] Yes Place 1 drop into both eyes daily as needed (dry eyes). [provider] Unknown Active Self, Family Member, Pharmacy Records  sodium chloride (OCEAN) 0.65 % SOLN nasal spray [id number] Yes Place 1 spray into both nostrils as needed for congestion. [provider] Unknown Active Self, Family Member, Pharmacy Records  tamsulosin (FLOMAX) 0.4 MG CAPS capsule [id number] Yes TAKE 1 CAPSULE TWICE A DAY  Patient taking differently: Take 0.4 mg by mouth in the morning and at bedtime.   Joaquim Nam, MD 12/21/2022 Active Self, Family Member, Pharmacy Records            SDOH:  (Social Determinants of Health) assessments and interventions performed: {yes/no:20286} SDOH Interventions    Flowsheet Row Office Visit from 11/26/2022 in [facility]  SDOH Interventions   Depression Interventions/Treatment  Counseling       Medication Assistance: {MEDASSISTANCEINFO:25044}  Medication Access: Name and location of current pharmacy:  [facility] [address], [place] - [address] [place] [address] [place] [address] Phone: [phone] Fax: [phone]  [facility] - [place], [place] - [address] [place] [address] Phone: [phone] Fax: [phone]  [facility] - [place], [place] - [address] [place] [address] Phone: [phone] Fax: [phone]  [facility] [address] [place] [address] Phone: [phone] Fax: [phone]  Within the past 30 days, how often has patient missed a dose of medication? *** Is a pillbox or other method used to improve adherence? {YES/NO:21197} Factors that may affect medication adherence? {CHL DESC; BARRIERS:21522} Are meds synced by current pharmacy? {YES/NO:21197} Are meds delivered by current pharmacy? {YES/NO:21197} Does patient experience delays in picking up medications due to transportation concerns? {YES/NO:21197}  Compliance/Adherence/Medication fill history: Care Gaps: Eye exam (never done) AWV (due 12/2022)  Star-Rating Drugs: ***   Assessment/Plan   Hyperlipidemia / ASCVD (LDL goal < 70) -{US controlled/uncontrolled:25276} - LDL 66 (12/2022) -Hx CAD (CABG 2000); NSTEMI 05/2021; CVA 12/2022 -Current treatment: Atorvastatin 80 mg daily Nitroglycerin 0.4 mg SL prn Brilinta 90 mg BID Aspirin 81 mg daily -Medications previously tried: clopidogrel  -Current dietary patterns: *** -Current exercise habits: *** -Educated on {CCM HLD Counseling:25126} -{CCMPHARMDINTERVENTION:25122}  Hypertension (BP goal <130/80) -{US controlled/uncontrolled:25276} -Current treatment: Metoprolol tartrate 25 mg - 1/2 tab BID Losartan-HCTZ 50-12.5 mg daily -Medications previously tried: ***  -Current home readings: *** -Current dietary habits: *** -Current exercise habits: *** -{ACTIONS;DENIES/REPORTS:21021675::"Denies"} hypotensive/hypertensive symptoms -Educated on {CCM BP Counseling:25124} -Counseled to monitor BP at home ***, document, and provide log at future appointments -{CCMPHARMDINTERVENTION:25122}  Diabetes (A1c goal <7%) -{US controlled/uncontrolled:25276} - A1c 6.6% (12/2022) -Current  medications: None -Medications previously tried: ***  -Current home glucose readings fasting glucose: *** post prandial glucose: *** -{ACTIONS;DENIES/REPORTS:21021675::"Denies"} hypoglycemic/hyperglycemic symptoms -Current meal patterns:  breakfast: ***  lunch: ***  dinner: *** snacks: *** drinks: *** -Current exercise: *** -Educated on {CCM DM COUNSELING:25123} -Counseled to check feet daily and get yearly eye exams -{CCMPHARMDINTERVENTION:25122}  Health Maintenance -Vaccine gaps: Shingrix -GERD: on pantoprazole 40 mg -BPH: tamsulosin 0.4 mg -Gout: allopurinol 300 mg daily -Depression/anxiety: escitalopram 5 mg -OTC: Ocuvite, Miralax, Vitamin D, black cherry

## 2023-01-13 ENCOUNTER — Other Ambulatory Visit: Payer: Self-pay

## 2023-01-13 MED ORDER — METOPROLOL TARTRATE 25 MG PO TABS: 12.5000 mg | ORAL_TABLET | Freq: Two times a day (BID) | ORAL | 0 refills | Status: DC

## 2023-01-18 ENCOUNTER — Ambulatory Visit: Payer: Medicare Other | Admitting: Family Medicine

## 2023-01-18 ENCOUNTER — Encounter: Payer: Self-pay | Admitting: Family Medicine

## 2023-01-18 ENCOUNTER — Ambulatory Visit: Payer: BLUE CROSS/BLUE SHIELD | Admitting: Family Medicine

## 2023-01-18 VITALS — BP 112/66 | HR 85 | Temp 98.2°F | Ht 70.0 in | Wt 209.0 lb

## 2023-01-18 DIAGNOSIS — Z8673 Personal history of transient ischemic attack (TIA), and cerebral infarction without residual deficits: Secondary | ICD-10-CM | POA: Diagnosis not present

## 2023-01-18 DIAGNOSIS — I639 Cerebral infarction, unspecified: Secondary | ICD-10-CM

## 2023-01-18 LAB — COMPREHENSIVE METABOLIC PANEL
ALT: 12 U/L (ref 0–53)
AST: 12 U/L (ref 0–37)
Albumin: 4.1 g/dL (ref 3.5–5.2)
Alkaline Phosphatase: 110 U/L (ref 39–117)
BUN: 24 mg/dL — ABNORMAL HIGH (ref 6–23)
CO2: 26 mEq/L (ref 19–32)
Calcium: 9.3 mg/dL (ref 8.4–10.5)
Chloride: 106 mEq/L (ref 96–112)
Creatinine, Ser: 1.05 mg/dL (ref 0.40–1.50)
GFR: 71.64 mL/min (ref >60.00)
Glucose, Bld: 164 mg/dL — ABNORMAL HIGH (ref 70–99)
Potassium: 3.4 mEq/L — ABNORMAL LOW (ref 3.5–5.1)
Sodium: 140 mEq/L (ref 135–145)
Total Bilirubin: 0.9 mg/dL (ref 0.2–1.2)
Total Protein: 7.4 g/dL (ref 6.0–8.3)

## 2023-01-18 LAB — CBC WITH DIFFERENTIAL/PLATELET
Basophils Absolute: 0 10*3/uL (ref 0.0–0.1)
Basophils Relative: 0.3 % (ref 0.0–3.0)
Eosinophils Absolute: 0.1 10*3/uL (ref 0.0–0.7)
Eosinophils Relative: 0.8 % (ref 0.0–5.0)
HCT: 40.3 % (ref 39.0–52.0)
Hemoglobin: 13.2 g/dL (ref 13.0–17.0)
Lymphocytes Relative: 21.1 % (ref 12.0–46.0)
Lymphs Abs: 1.5 10*3/uL (ref 0.7–4.0)
MCHC: 32.8 g/dL (ref 30.0–36.0)
MCV: 91.2 fl (ref 78.0–100.0)
Monocytes Absolute: 0.5 10*3/uL (ref 0.1–1.0)
Monocytes Relative: 6.4 % (ref 3.0–12.0)
Neutro Abs: 5.1 10*3/uL (ref 1.4–7.7)
Neutrophils Relative %: 71.4 % (ref 43.0–77.0)
Platelets: 204 10*3/uL (ref 150.0–400.0)
RBC: 4.42 Mil/uL (ref 4.22–5.81)
RDW: 14.5 % (ref 11.5–15.5)
WBC: 7.2 10*3/uL (ref 4.0–10.5)

## 2023-01-18 NOTE — Progress Notes (Signed)
Follow-up with PCP within 1 to 2 weeks and repeat CBC, CMP, mag, Phos within 1 week Follow-up with cardiology in outpatient setting Follow-up with neurology in 4 to 6 weeks and dual antiplatelet therapy with aspirin and Brilinta   Discharge Diagnoses: Principal Problem:   Acute CVA (cerebrovascular accident) [facility])   Resolved Problems:   * No resolved hospital problems. [facility] Course: HPI per Dr. Dow Adolph on 12/22/22 HPI: Tyler Guerra. is a 72 y.o. male with medical history significant for coronary artery disease on DAPT, aspirin, Plavix, high intensity statin Lipitor 80 mg daily, hypertension, TIAs, who presented to [facility] ED from home with intermittent left leg weakness past 3 weeks.  Endorses having trouble with walking and his left leg giving out.  His wife brought him in for further evaluation.   In the ED, workup revealed moderate size right PCA territory infarct, seen on MRI brain.  Seen by neurology/stroke team, recommended admission for stroke workup.  Admitted by [facility], hospitalist service to telemetry medical unit under inpatient status.   ED Course: Temperature 98.2.  BP 122/78, pulse 72, respiratory 10, O2 saturation 96% on room air.  Lab studies remarkable for hemoglobin A1c 6.6.  LDL 66.  Serum glucose 168, BUN 27.   **Interim History Patient underwent stroke workup completed this prior to discharge.  Neurology recommended a loop recorder and this was placed and PT OT recommending home health with patient preferring outpatient PT.  Patient was changed to aspirin and Brilinta and prior to discharge orthostatic vital signs were done and he was not orthostatic so he has been deemed stable for discharge and will need to follow-up with PCP, neurology and cardiology outpatient setting.   Assessment and Plan:   Acute right ACA CVA, seen on MRI brain. -Prior to admission on DAPT aspirin and Plavix as well as high intensity statin Lipitor 80 mg daily, per his wife he has been  compliant with his home medications, restarted.   -Admitted for stroke workup and now being changed to aspirin and Brilinta Follow 2D echo and showed "Left ventricular ejection fraction, by estimation, is 55 to 60%. The left ventricle has normal function. The left ventricle demonstrates regional wall motion abnormalities (see scoring diagram/findings for  description). There is mild concentric left ventricular hypertrophy. Left ventricular diastolic parameters are  consistent with Grade I diastolic dysfunction (impaired relaxation). There is mild dyskinesis of the left ventricular, basal inferior wall. There is moderate hypokinesis of the left  ventricular, basal-mid inferoseptal wall.   2. Right ventricular systolic function is normal. The right ventricular size is normal. Tricuspid regurgitation signal is inadequate for assessing PA pressure.   3. The mitral valve is normal in structure. No evidence of mitral valve regurgitation.   4. The aortic valve is tricuspid. There is mild calcification of the aortic valve. There is mild thickening of the aortic valve. Aortic valve regurgitation is trivial. Mild aortic valve stenosis." -LDL 66, goal less than 70 -A1c 6.6, goal less than 7.0. Permissive hypertension treat SBP greater than 220 or DBP greater than 120 PT/OT/speech therapist assessment and they are recommending home health PT and OT but he prefers outpatient PT OT -Fall precautions/aspiration precautions and orthostatics checked prior to discharge and he is not orthostatic Neuro checks Stroke team following and appreciate further evaluation recommendations and neurology recommending implantable loop recorder prior to discharge and he is now stable for discharge with outpatient follow-up   Type 2 diabetes with hyperglycemia -Hemoglobin A1c  6.6 -Insulin sliding scale every 4 hours while NPO and then changed to regular scale -CBG Trend: Last Labs   Coronary artery disease -Hold off oral  antihypertensives due to permissive HTN but ok to resume at /C -Platelet therapy changed to aspirin and Brilinta twice daily and will continue high intensity statin -Denies any anginal symptoms.   GERD -Resume home regimen   BPH Resume home regimen   Chronic anxiety/depression -Stable -Resume home escitalopram   Hypokalemia -Patient's K+ Level Trend: Last Labs -Replete with po Kcl 40 MEQ BID  -Continue to Monitor and Replete as Necessary -Repeat CMP within 1 week   Obesity -Complicates overall prognosis and care -Estimated body mass index is 31.38 kg/m as calculated from the following:   Height as of this encounter: 5\' 10"  (1.778 m).   Weight as of this encounter: 99.2 kg.  -Weight Loss and Dietary Counseling given   Consultants: Neurology, EP cardiology Procedures performed: As above Disposition: Home health for today and will go for outpatient PT Diet recommendation:  Carb modified diet DISCHARGE MEDICATION: Allergies as of 12/22/2022  No Known Allergies       Medication List     TAKE these medications    allopurinol 300 MG tablet Commonly known as: ZYLOPRIM TAKE 1 TABLET DAILY   aspirin EC 81 MG tablet Take 1 tablet (81 mg total) by mouth daily.   atorvastatin 80 MG tablet Commonly known as: LIPITOR TAKE 1 TABLET DAILY   BLACK CHERRY CONCENTRATE PO Take 1 tablet by mouth at bedtime.   clopidogrel 75 MG tablet Commonly known as: PLAVIX Take 1 tablet (75 mg total) by mouth daily. Resume After Marden Noble is completed after 30 days Start taking on: January 22, 2023 What changed:  when to take this additional instructions These instructions start on January 22, 2023. If you are unsure what to do until then, ask your doctor or other care provider.   escitalopram 5 MG tablet Commonly known as: LEXAPRO TAKE 1 TABLET DAILY   losartan-hydrochlorothiazide 50-12.5 MG tablet Commonly known as: Hyzaar Take 1 tablet by mouth daily.   metoprolol tartrate 25 MG  tablet Commonly known as: LOPRESSOR TAKE ONE-HALF (1/2) TABLET TWICE A DAY What changed: See the new instructions.   nitroGLYCERIN 0.4 MG SL tablet Commonly known as: NITROSTAT Place 1 tablet (0.4 mg total) under the tongue every 5 (five) minutes as needed for chest pain.   OCUVITE PO Take 1 capsule by mouth daily.   pantoprazole 40 MG tablet Commonly known as: PROTONIX TAKE 1 TABLET DAILY   polyethylene glycol powder 17 GM/SCOOP powder Commonly known as: GLYCOLAX/MIRALAX Take 17 g by mouth daily as needed for mild constipation.   sodium chloride 0.65 % Soln nasal spray Commonly known as: OCEAN Place 1 spray into both nostrils as needed for congestion.   Systane Balance 0.6 % Soln Generic drug: Propylene Glycol Place 1 drop into both eyes daily as needed (dry eyes).   tamsulosin 0.4 MG Caps capsule Commonly known as: FLOMAX TAKE 1 CAPSULE TWICE A DAY What changed: when to take this   ticagrelor 90 MG Tabs tablet Commonly known as: Brilinta Take 1 tablet (90 mg total) by mouth 2 (two) times daily. Take For 4 Weeks total and then switch back to Plavix (clopidogrel).   VITAMIN D PO Take 1 capsule by mouth daily.      =============== Inpatient course discussed with patient.  Admitted with CVA. He has loop recorder placed, d/w pt.    On  aspirin and brilinta but not on plavix. D/w pt about plan to change back to plavix.  Rationale discussed with patient.  No bleeding. He had PT f/up pending for next week.  Has neuro eval tomorrow.    Swallowing well.  Sleeping well.  L arm is not as strong as prev but more trouble with L knee buckling.   Using rollator at baseline.  Fall cautions discussed with patient.  Meds, vitals, and allergies reviewed.   ROS: Per HPI unless specifically indicated in ROS section   Nad Ncat Neck supple, no LA Rrr Ctab Abd soft, not ttp Altered sensation L hand dorsum.   Symmetric grip B.  Weaker L leg on testing. Able to stand and bear  weight, using rollator.  See notes on labs.

## 2023-01-18 NOTE — Patient Instructions (Addendum)
Go to the lab on the way out.   If you have mychart we'll likely use that to update you.     Finish brilinta and then change back to plavix.  Keep taking aspirin.  Recheck in about 3 months, labs at the visit.

## 2023-01-19 ENCOUNTER — Ambulatory Visit (INDEPENDENT_AMBULATORY_CARE_PROVIDER_SITE_OTHER): Payer: Medicare Other | Admitting: Neurology

## 2023-01-19 ENCOUNTER — Encounter: Payer: Self-pay | Admitting: Neurology

## 2023-01-19 VITALS — BP 110/68 | HR 85 | Ht 70.0 in | Wt 209.0 lb

## 2023-01-19 DIAGNOSIS — I63521 Cerebral infarction due to unspecified occlusion or stenosis of right anterior cerebral artery: Secondary | ICD-10-CM | POA: Diagnosis not present

## 2023-01-19 NOTE — Patient Instructions (Signed)
I will see you back in 4 months 

## 2023-01-19 NOTE — Progress Notes (Unsigned)
[facility] Neurology Division Clinic Note - Initial Visit   Date: 01/19/2023   [name]. MRN: [id number] DOB: 1951/03/14   Dear Dr. Para March:  Thank you for your kind referral of Saamir Selinsky. for consultation of right ACA stroke. Although his history is well known to you, please allow [place] to reiterate it for the purpose of our medical record. The patient was accompanied to the clinic by wife and daughter who also provides collateral information.     Weaver Secrist. is a 72 y.o. right-handed male with CAD, hyperlipidemia, hypertension, GERD, anxiety/depression presenting for evaluation of stroke.   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  Exam shows normal strength, but he may have difficulty with coordinating leg movement.  He was started on aspirin and Brilinta and will be switching to aspirin and plavix.  Diabetes (HbA1c 6.6) and cholesterol (LDL 66) is well controlled.  Loop recorder has been placed.  - Continue risk factor modification  - Start out-patient PT  Return to clinic in 4 months  ------------------------------------------------------------- History of present illness: He was hospitalized in May due to left leg weakness x 3 weeks and found to have right ACA territory infarct due to distal A1 occlusion.  He was on aspirin and plavix already, so switched to aspirin and Brilinta. There was concern that he may have embolic event.  Echo showed normal EF, no shunt.  He was discharged home and had loop recorder placed on 5/21.  He feels that the left leg is a little stronger, but it still tends to buckle when walking. He denies numbness/tingling of the left leg.   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  Out-side paper records, electronic medical record, and images have been reviewed where available and summarized as:   CTA head and neck 12/22/2022: No emergent  large vessel occlusion or hemodynamically significant stenosis of the head or neck.  Aortic atherosclerosis (ICD10-I70.0).  MRI brain wo contrast 12/21/2022: Intermediate sized area of acute ischemia within the right anterior cerebral artery territory. No hemorrhage or mass effect.   Lab Results  Component Value Date   HGBA1C 6.6 (H) 12/22/2022   No results found for: "VITAMINB12" Lab Results  Component Value Date   TSH 1.32 11/26/2022   No results found for: "ESRSEDRATE", "POCTSEDRATE"  Past Medical History:  Diagnosis Date   Anxiety state 03/16/2016   Aphasia 01/26/2017   Benign localized prostatic hyperplasia with lower urinary tract symptoms (LUTS)    BPV (benign positional vertigo) 11/02/2014   Carotid artery stenosis    PER DUPLEX 10-22-2015  BILATERAL ICA 1-39%   Cataract    Coronary artery disease    CARDIOLOGIST-  DR Verdis Prime--  VISIT EVERY OTHER YEAR-- LOV 2015   Coronary atherosclerosis 07/19/2010   Qualifier: Diagnosis of  By: Para March MD, Graham     Depression 01/26/2017   Diabetes mellitus without complication (HCC) 03/02/2018   GERD 07/19/2010   Qualifier: Diagnosis of  By: Para March MD, Cheree Ditto     GERD (gastroesophageal reflux disease)    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   Gout    Gout 07/18/2010   Qualifier: Diagnosis of  By: Para March MD, Cheree Ditto     H/O eye injury    chronic changes to left eye after injury   Hand weakness 03/02/2018   Hearing loss 04/21/2013   History of TIA (transient ischemic attack)    03/ 2017  no  residual after brief episode loss peripheral vision   HOH (hard of hearing)    Hyperglycemia 05/17/2016   Hyperlipemia    Hyperlipidemia 07/18/2010   Qualifier: Diagnosis of  By: Para March MD, Cheree Ditto     Hypertension    HYPERTENSION, BENIGN ESSENTIAL 07/18/2010   Qualifier: Diagnosis of  By: Para March MD, Cheree Ditto     Hypokalemia 01/26/2017   Knee pain 03/13/2019   Left shoulder pain 09/04/2018   Pneumothorax 10/11/2019   RIGHT    Prostate cancer  (HCC) UROLOGIST-  DR GRAPEY/  ONCOLOGIST-  DR MANNING   dx 2015--- Stage T1c, Gleason 3+4, PSA 4.03, vol 44cc   PVC's (premature ventricular contractions)    Rash and nonspecific skin eruption 10/14/2015   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   S/P CABG x 05 Dec 1998   Skin lesion 10/14/2015   Stroke [facility])    TIA (transient ischemic attack) 10/14/2015   Ventral hernia 10/26/2011   Wears glasses     Past Surgical History:  Procedure Laterality Date   APPENDECTOMY  1975   CARDIAC CATHETERIZATION  12-22-2001   dr Verdis Prime   severe native vessel disease mLAD 60-70%,  total occulsion pCFX  and pRCA/  widely patent saphenous vein , free radial , and LIMA grafts/  minminal lv dysfunction, ef 60%   CHEST TUBE INSERTION Right 10/11/2019   CORONARY ARTERY BYPASS GRAFT  12/1998   LIMA to LAD,  SVG to PDA and Diagonal, Free radial graft to OM   CORONARY STENT INTERVENTION N/A 05/26/2021   Procedure: CORONARY STENT INTERVENTION;  Surgeon: Orbie Pyo, MD;  Location: [facility] INVASIVE CV LAB;  Service: Cardiovascular;  Laterality: N/A;   Exericse treadmill test  last one 01-03-2014  dr Verdis Prime   normal exercise tolerance w/ hypertensive repsonse,  no ischemic EKG changes, appropriate HR response & recovery ([facility] TM score 9;  Low Risk , PVC's w/ exertion)   EYE SURGERY     FRACTURE SURGERY     LEFT HEART CATH AND CORS/GRAFTS ANGIOGRAPHY N/A 05/26/2021   Procedure: LEFT HEART CATH AND CORS/GRAFTS ANGIOGRAPHY;  Surgeon: Orbie Pyo, MD;  Location: [facility] INVASIVE CV LAB;  Service: Cardiovascular;  Laterality: N/A;   LOOP RECORDER INSERTION N/A 12/22/2022   Procedure: LOOP RECORDER INSERTION;  Surgeon: Marinus Maw, MD;  Location: [facility] INV[facility] CV [facility];  Service: Cardiovascular;  Laterality: N/A;   PROSTATE BIOPSY     RADIOACTIVE SEED IMPLANT N/A 01/13/2016   Procedure: RADIOACTIVE SEED IMPLANT/BRACHYTHERAPY IMPLANT;  Surgeon: Barron Alvine, MD;  Location: [facility];  Service: Urology;  Laterality: N/A;     Medications:  Outpatient Encounter Medications as of 01/19/2023  Medication Sig   allopurinol (ZYLOPRIM) 300 MG tablet TAKE 1 TABLET DAILY (Patient taking differently: Take 300 mg by mouth daily.)   aspirin EC 81 MG tablet Take 1 tablet (81 mg total) by mouth daily.   atorvastatin (LIPITOR) 80 MG tablet TAKE 1 TABLET DAILY   Cholecalciferol (VITAMIN D PO) Take 1 capsule by mouth daily.   clopidogrel (PLAVIX) 75 MG tablet Take 75 mg by mouth daily. Starting 01/22/23   escitalopram (LEXAPRO) 5 MG tablet TAKE 1 TABLET DAILY (Patient taking differently: Take 5 mg by mouth daily.)   losartan-hydrochlorothiazide (HYZAAR) 50-12.5 MG tablet Take 1 tablet by mouth daily.   metoprolol tartrate (LOPRESSOR) 25 MG tablet Take 0.5 tablets (12.5 mg total) by mouth 2 (two) times daily.   Misc Natural Products (  BLACK CHERRY CONCENTRATE PO) Take 1 tablet by mouth at bedtime.   Multiple Vitamins-Minerals (OCUVITE PO) Take 1 capsule by mouth daily.   nitroGLYCERIN (NITROSTAT) 0.4 MG SL tablet Place 1 tablet (0.4 mg total) under the tongue every 5 (five) minutes as needed for chest pain.   pantoprazole (PROTONIX) 40 MG tablet Take 1 tablet (40 mg total) by mouth daily.   polyethylene glycol powder (GLYCOLAX/MIRALAX) 17 GM/SCOOP powder Take 17 g by mouth daily as needed for mild constipation.   Propylene Glycol (SYSTANE BALANCE) 0.6 % SOLN Place 1 drop into both eyes daily as needed (dry eyes).   sodium chloride (OCEAN) 0.65 % SOLN nasal spray Place 1 spray into both nostrils as needed for congestion.   tamsulosin (FLOMAX) 0.4 MG CAPS capsule TAKE 1 CAPSULE TWICE A DAY (Patient taking differently: Take 0.4 mg by mouth in the morning and at bedtime.)   ticagrelor (BRILINTA) 90 MG TABS tablet Take 1 tablet (90 mg total) by mouth 2 (two) times daily.   Vitamin A 3 MG CAPS Take by mouth.   No facility-administered encounter medications on file as of 01/19/2023.     Allergies: No Known Allergies  Family History: Family History  Problem Relation Age of Onset   Cancer Father        prostate in eighties   Prostate cancer Father    Heart disease Father    Stroke Father    Dementia Mother    Heart disease Mother    Cancer Sister        breast   Cancer Paternal Uncle        prostate   Anxiety disorder Sister    Colon cancer Neg Hx     Social History: Social History   Tobacco Use   Smoking status: Never   Smokeless tobacco: Never  Vaping Use   Vaping Use: Never used  Substance Use Topics   Alcohol use: No   Drug use: No   Social History   Social History Narrative   Married 1974   2 daughters and 2 grandkids   Retired 2018.      Right Handed    Lives in a two story home     Vital Signs:  BP 110/68   Pulse 85   Ht 5\' 10"  (1.778 m)   Wt 209 lb (94.8 kg)   SpO2 97%   BMI 29.99 kg/m    Neurological Exam: MENTAL STATUS including orientation to time, place, person, recent and remote memory, attention span and concentration, language, and fund of knowledge is normal.  Speech is not dysarthric.  CRANIAL NERVES: II:  No visual field defects.     III-IV-VI: Pupils equal round and reactive to light.  Normal conjugate, extra-ocular eye movements in all directions of gaze.  No nystagmus.  No ptosis.   V:  Normal facial sensation.    VII:  Normal facial symmetry and movements.   VIII:  Normal hearing and vestibular function.   IX-X:  Normal palatal movement.   XI:  Normal shoulder shrug and head rotation.   XII:  Normal tongue strength and range of motion, no deviation or fasciculation.  MOTOR:  No atrophy, fasciculations or abnormal movements.  No pronator drift.   Upper Extremity:  Right  Left  Deltoid  5/5   5/5   Biceps  5/5   5/5   Triceps  5/5   5/5   Wrist extensors  5/5   5/5   Wrist flexors  5/5   5/5   Finger extensors  5/5   5/5   Finger flexors  5/5   5/5   Dorsal interossei  5/5   5/5   Abductor pollicis   5/5   5/5   Tone (Ashworth scale)  0  0   Lower Extremity:  Right  Left  Hip flexors  5/5   5/5   Knee flexors  5/5   5/5   Knee extensors  5/5   5/5   Dorsiflexors  5/5   5/5   Plantarflexors  5/5   5/5   Toe extensors  5/5   5/5   Toe flexors  5/5   5/5   Tone (Ashworth scale)  0  0   MSRs:                                           Right        Left brachioradialis 2+  2+  biceps 2+  2+  triceps 2+  2+  patellar 2+  3+  ankle jerk 2+  2+  Hoffman no  no  plantar response down  down   SENSORY:  Normal and symmetric perception of light touch, pinprick, vibration, and temperature.    COORDINATION/GAIT: Normal finger-to- nose-finger.  Intact rapid alternating movements bilaterally. Dysmetria with heel-to-shin testing on the left. Gait is mildly wide-based, somewhat unsteady    Thank you for allowing me to participate in patient's care.  If I can answer any additional questions, I would be pleased to do so.    Sincerely,    Kindred Heying K. Allena Katz, DO

## 2023-01-20 ENCOUNTER — Encounter: Payer: Self-pay | Admitting: Neurology

## 2023-01-21 NOTE — Assessment & Plan Note (Signed)
History of. Continue with physical therapy.  Has neurology follow-up pending. Finish brilinta and then change back to plavix.  Continue aspirin, plan on recheck in about 3 months, labs at the visit.   Continue atorvastatin.  Fall cautions discussed with patient.  Continue losartan hydrochlorothiazide metoprolol. 30 minutes were devoted to patient care in this encounter (this includes time spent reviewing the patient's file/history, interviewing and examining the patient, counseling/reviewing plan with patient).

## 2023-01-22 DIAGNOSIS — Z961 Presence of intraocular lens: Secondary | ICD-10-CM | POA: Diagnosis not present

## 2023-01-22 DIAGNOSIS — H31012 Macula scars of posterior pole (postinflammatory) (post-traumatic), left eye: Secondary | ICD-10-CM | POA: Diagnosis not present

## 2023-01-22 DIAGNOSIS — H2511 Age-related nuclear cataract, right eye: Secondary | ICD-10-CM | POA: Diagnosis not present

## 2023-01-22 DIAGNOSIS — H524 Presbyopia: Secondary | ICD-10-CM | POA: Diagnosis not present

## 2023-01-22 DIAGNOSIS — E119 Type 2 diabetes mellitus without complications: Secondary | ICD-10-CM | POA: Diagnosis not present

## 2023-01-22 DIAGNOSIS — H43812 Vitreous degeneration, left eye: Secondary | ICD-10-CM | POA: Diagnosis not present

## 2023-01-22 DIAGNOSIS — H5213 Myopia, bilateral: Secondary | ICD-10-CM | POA: Diagnosis not present

## 2023-01-22 LAB — HM DIABETES EYE EXAM

## 2023-01-25 NOTE — Therapy (Signed)
OUTPATIENT PHYSICAL THERAPY NEURO EVALUATION   Patient Name: Tyler Guerra. MRN: [id number] DOB:[birth year], 72 y.o., male Today's Date: 01/26/2023   PCP: Joaquim Nam, MD  REFERRING PROVIDER: Merlene Laughter, DO  END OF SESSION:  PT End of Session - 01/26/23 1601     Visit Number 1    Number of Visits 17    Date for PT Re-Evaluation 03/27/23    Authorization Type Medicare -Part A & B    PT Start Time 1400    PT Stop Time 1445    PT Time Calculation (min) 45 min    Equipment Utilized During Treatment Gait belt    Activity Tolerance Patient tolerated treatment well    Behavior During Therapy WFL for tasks assessed/performed             Past Medical History:  Diagnosis Date   Anxiety state 03/16/2016   Aphasia 01/26/2017   Benign localized prostatic hyperplasia with lower urinary tract symptoms (LUTS)    BPV (benign positional vertigo) 11/02/2014   Carotid artery stenosis    PER DUPLEX 10-22-2015  BILATERAL ICA 1-39%   Cataract    Coronary artery disease    CARDIOLOGIST-  DR Verdis Prime--  VISIT EVERY OTHER YEAR-- LOV 2015   Coronary atherosclerosis 07/19/2010   Qualifier: Diagnosis of  By: Para March MD, Graham     Depression 01/26/2017   Diabetes mellitus without complication (HCC) 03/02/2018   GERD 07/19/2010   Qualifier: Diagnosis of  By: Para March MD, Cheree Ditto     GERD (gastroesophageal reflux disease)    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   Gout    Gout 07/18/2010   Qualifier: Diagnosis of  By: Para March MD, Cheree Ditto     H/O eye injury    chronic changes to left eye after injury   Hand weakness 03/02/2018   Hearing loss 04/21/2013   History of TIA (transient ischemic attack)    03/ 2017  no residual after brief episode loss peripheral vision   HOH (hard of hearing)    Hyperglycemia 05/17/2016   Hyperlipemia    Hyperlipidemia 07/18/2010   Qualifier: Diagnosis of  By: Para March MD, Graham     Hypertension    HYPERTENSION, BENIGN ESSENTIAL 07/18/2010   Qualifier:  Diagnosis of  By: Para March MD, Graham     Hypokalemia 01/26/2017   Knee pain 03/13/2019   Left shoulder pain 09/04/2018   Pneumothorax 10/11/2019   RIGHT    Prostate cancer (HCC) UROLOGIST-  DR GRAPEY/  ONCOLOGIST-  DR MANNING   dx 2015--- Stage T1c, Gleason 3+4, PSA 4.03, vol 44cc   PVC's (premature ventricular contractions)    Rash and nonspecific skin eruption 10/14/2015   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   S/P CABG x 05 Dec 1998   Skin lesion 10/14/2015   Stroke [facility])    TIA (transient ischemic attack) 10/14/2015   Ventral hernia 10/26/2011   Wears glasses    Past Surgical History:  Procedure Laterality Date   APPENDECTOMY  1975   CARDIAC CATHETERIZATION  12-22-2001   dr Verdis Prime   severe native vessel disease mLAD 60-70%,  total occulsion pCFX  and pRCA/  widely patent saphenous vein , free radial , and LIMA grafts/  minminal lv dysfunction, ef 60%   CHEST TUBE INSERTION Right 10/11/2019   CORONARY ARTERY BYPASS GRAFT  12/1998   LIMA to LAD,  SVG to PDA and Diagonal, Free radial graft to OM   CORONARY STENT INTERVENTION  N/A 05/26/2021   Procedure: CORONARY STENT INTERVENTION;  Surgeon: Orbie Pyo, MD;  Location: [facility];  Service: Cardiovascular;  Laterality: N/A;   Exericse treadmill test  last one 01-03-2014  dr Verdis Prime   normal exercise tolerance w/ hypertensive repsonse,  no ischemic EKG changes, appropriate HR response & recovery ([facility] TM score 9;  Low Risk , PVC's w/ exertion)   EYE SURGERY     FRACTURE SURGERY     LEFT HEART CATH AND CORS/GRAFTS ANGIOGRAPHY N/A 05/26/2021   Procedure: LEFT HEART CATH AND CORS/GRAFTS ANGIOGRAPHY;  Surgeon: Orbie Pyo, MD;  Location: [facility];  Service: Cardiovascular;  Laterality: N/A;   LOOP RECORDER INSERTION N/A 12/22/2022   Procedure: LOOP RECORDER INSERTION;  Surgeon: Marinus Maw, MD;  Location: [facility];  Service: Cardiovascular;  Laterality: N/A;   PROSTATE  BIOPSY     RADIOACTIVE SEED IMPLANT N/A 01/13/2016   Procedure: RADIOACTIVE SEED IMPLANT/BRACHYTHERAPY IMPLANT;  Surgeon: Barron Alvine, MD;  Location: [facility];  Service: Urology;  Laterality: N/A;   Patient Active Problem List   Diagnosis Date Noted   Acute CVA (cerebrovascular accident) (HCC) 12/22/2022   CVA (cerebral vascular accident) (HCC) 12/22/2022   Constipation 03/04/2022   CAD (coronary artery disease) 05/24/2021   NSTEMI (non-ST elevated myocardial infarction) (HCC) 05/24/2021   Pneumothorax 10/11/2019   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   Knee joint symptom 03/13/2019   Left shoulder pain 09/04/2018   Diabetes mellitus without complication (HCC) 03/02/2018   Anxiety state 03/16/2016   Pseudophakia of both eyes 02/24/2016   TIA (transient ischemic attack) 10/14/2015   BPV (benign positional vertigo) 11/02/2014   Advance care planning 05/13/2014   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   Hearing loss 04/21/2013   Ventral hernia 10/26/2011   Medicare annual wellness visit, subsequent 10/26/2011   Prostate cancer (HCC) 10/26/2011   Coronary atherosclerosis 07/19/2010   GERD 07/19/2010   Hyperlipidemia 07/18/2010   Gout 07/18/2010   Hypertension 07/18/2010    ONSET DATE: 12/22/2022  REFERRING DIAG: I63.9 (ICD-10-CM) - Acute CVA (cerebrovascular accident) (HCC)  THERAPY DIAG:  Unsteadiness on feet  Hemiplegia and hemiparesis following cerebral infarction affecting left non-dominant side (HCC)  Other abnormalities of gait and mobility  Muscle weakness (generalized)  History of falling  Rationale for Evaluation and Treatment: Rehabilitation  SUBJECTIVE:                                                                                                                                                                                             SUBJECTIVE STATEMENT: Has had  1 fall in the past month, fell on his bottom in the bathroom when  trying to go by himself. Has had some almost falls. When standing will sometimes fall to the L and wife is always on that side. Sometimes feels like his L leg will not want to work and then it will start shaking. Went to PT in [place] and was instructed to come here to neuro clinic instead. Has not had therapy since discharging from hospital on 12/22/22. Prior to CVA, was independent and did not use AD and was riding bikes (e-bikes). Denies any changing in thinking/memory. Had 2 episodes where his BP did drop at home. Can dress and bathe himself and feeds himself. Walking is the big issue. Pt's wife is always there with standing or walking.   Pt accompanied by:  daughter Inetta Fermo, [name]    PERTINENT HISTORY: Pt is 72 yo male who presented on 12/21/22 with intermittent L leg weakness past 3 weeks. 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 Pt discharged on 12/22/22. Pt with hx including but not limited to CAD, HTN, TIA, anxiety, BPPV, HLD, gout, prostate CA, CABG and stents.  Pt also hard of hearing.   Vitals:   01/26/23 1424  BP: 136/67  Pulse: 74     PAIN:  Are you having pain? No  PRECAUTIONS: Fall  WEIGHT BEARING RESTRICTIONS: No  FALLS: Has patient fallen in last 6 months? Yes. Number of falls 5, denies injury, just some bruises   LIVING ENVIRONMENT: Lives with: lives with their spouse and has family closeby and grand son is always home  Lives in: House/apartment Stairs: Yes: Internal: 12 steps; on right going up, level entry into house  Has following equipment at home: Dan Humphreys - 2 wheeled, Environmental consultant - 4 wheeled, shower chair, and Uses standard 2 wheeled RW, uses rollator out in the community as he can sit in it.   PLOF: Independent and Leisure: enjoys biking and his riding his UTV  PATIENT GOALS: Wants to build more strength in L leg  OBJECTIVE:   DIAGNOSTIC FINDINGS: MRI brain wo contrast 12/21/2022: Intermediate sized area of acute ischemia  within the right anterior cerebral artery territory. No hemorrhage or mass effect.   COGNITION: Overall cognitive status: Within functional limits for tasks assessed   SENSATION: Light touch: WFL  Reports some tingling in feet at night.   COORDINATION: Heel to shin: slightly slower with LLE   RAM with BUE: impaired coordination with LUE   POSTURE: rounded shoulders, forward head, and weight shift right  LOWER EXTREMITY ROM:     Decr LLE knee extension AROM due to weakness  LOWER EXTREMITY MMT:    MMT Right Eval Left Eval  Hip flexion 5 4+  Hip extension    Hip abduction 5 5  Hip adduction 5 5  Hip internal rotation    Hip external rotation    Knee flexion 5 5  Knee extension 5 3  Ankle dorsiflexion 5 4  Ankle plantarflexion    Ankle inversion    Ankle eversion    (Blank rows = not tested)  All tested in sitting.   BED MOBILITY:  Pt reports no difficulty getting in and out of the bed.   TRANSFERS: Assistive device utilized: Environmental consultant - 2 wheeled  Sit to stand: SBA and CGA Stand to sit: SBA and CGA RW in front of pt, cued to push up from chair vs. RW Pt with RLE staggered behind L,  able to perform a few reps with no UE support, pt with incr quad tremors with eccentric control back to chair   GAIT: Gait pattern: decreased step length- Right, decreased stance time- Left, decreased stride length, trunk flexed, and poor foot clearance- Left Distance walked: approx. 40'  Assistive device utilized: Environmental consultant - 2 wheeled Level of assistance: CGA and Min A Comments: with w/c follow, pt with L knee instability, one episode of LLE almost buckling  Pt's family reports this is farther than he normally walks at home   FUNCTIONAL TESTS:  5 times sit to stand: 24.5 seconds with no UE support, RLE staggered behind L "this feels as bad as doing 5 push-ups" 10 meter walk test: 44.22 seconds with RW = .74 ft/sec, pt needing one brief standing rest break due to LLE weakness and felt  like LLE could buckle   PATIENT SURVEYS:  FOTO Staff did not capture   TODAY'S TREATMENT:                                                                                                                              N/A during eval    PATIENT EDUCATION: Education details: Clinical findings, POC, can work on 5 reps of sit <> stands 2-3 times a day with RW and sturdy chair and wife supervision before returning to therapy for leg strength  Person educated: Patient, Spouse, and Child(ren) Education method: Medical illustrator Education comprehension: verbalized understanding  HOME EXERCISE PROGRAM: Verbally added 5x sit <> stand, will provide more at future session   GOALS: Goals reviewed with patient? Yes  SHORT TERM GOALS: Target date: 02/23/2023  Pt will be independent with initial HEP with family supervision in order to build upon functional gains made in therapy. Baseline: Goal status: INITIAL  2.  Pt and pt's family will verbalize understanding on fall prevention in the home.  Baseline:  Goal status: INITIAL  3.  BERG to be assessed with STG/LTG written.  Baseline:  Goal status: INITIAL  4. Pt will improve 5x sit<>stand to less than or equal to 20 sec with no UE support to demonstrate improved functional strength and transfer efficiency.   Baseline: 24.5 seconds with no UE support Goal status: INITIAL  5.  Pt will improve gait speed with RW to at least 1.2 ft/sec in order to demo decr fall risk.  Baseline: 44.22 seconds with RW = .74 ft/sec, pt needing one brief standing rest break due to LLE weakness and felt like LLE could buckle Goal status: INITIAL  6.  Pt will be able to stand for 5 minutes with supervision in order to improve standing tolerance for ADLs.  Baseline: Pt's spouse states pt can not stand for too long due to fatigue/weakness  Goal status: INITIAL  LONG TERM GOALS: Target date: 03/23/2023  Pt will be independent with final HEP with family  supervision in order to build upon functional gains made in therapy. Baseline:  Goal status: INITIAL  2.  BERG goal to be written  Baseline:  Goal status: INITIAL  3.  Pt will improve gait speed with RW vs. LRAD to at least 1.8 ft/sec in order to demo improved household mobility/decr fall risk  Baseline: 44.22 seconds with RW = .74 ft/sec, pt needing one brief standing rest break due to LLE weakness and felt like LLE could buckle Goal status: INITIAL  4.  Pt will ambulate at least 400' with LRAD and supervision (over indoor/outdoor paved surfaces) in order to demo improved household/community mobility.  Baseline:  24.5 seconds with no UE support Goal status: INITIAL  5.  Pt will improve 5x sit<>stand to less than or equal to 16 sec with no UE support to demonstrate improved functional strength and transfer efficiency.  Baseline:  Goal status: INITIAL    ASSESSMENT:  CLINICAL IMPRESSION: Patient is a 72 year old male referred to Neuro OPPT for R ACA CVA.   Pt's PMH is significant for: CAD, HTN, TIA, anxiety, BPPV, HLD, gout, prostate CA, CABG and stents. The following deficits were present during the exam: decr activity tolerance, postural abnormalities, decr strength (LLE) with knee instability, impaired balance, gait abnormalities, impaired coordination. Pt currently ambulating small distances at home with family supervision/support and with RW. Pt's LLE will sometimes buckle during ambulation or feel like it will buckle due to quad weakness.  Based on gait speed and 5x sit <> stand, pt is an incr risk for falls. Pt would benefit from skilled PT to address these impairments and functional limitations to maximize functional mobility independence and decr fall risk.    OBJECTIVE IMPAIRMENTS: Abnormal gait, decreased activity tolerance, decreased balance, decreased coordination, decreased endurance, decreased knowledge of use of DME, decreased mobility, difficulty walking, decreased ROM,  decreased strength, and postural dysfunction.   ACTIVITY LIMITATIONS: carrying, lifting, bending, standing, stairs, transfers, and locomotion level  PARTICIPATION LIMITATIONS: cleaning, driving, shopping, community activity, and yard work  PERSONAL FACTORS: Age, Behavior pattern, Past/current experiences, Time since onset of injury/illness/exacerbation, and 3+ comorbidities: CAD, HTN, TIA, anxiety, BPPV, HLD, gout, prostate CA, CABG and stents  are also affecting patient's functional outcome.   REHAB POTENTIAL: Good  CLINICAL DECISION MAKING: Evolving/moderate complexity  EVALUATION COMPLEXITY: Moderate  PLAN:  PT FREQUENCY: 2x/week  PT DURATION: 8 weeks  PLANNED INTERVENTIONS: Therapeutic exercises, Therapeutic activity, Neuromuscular re-education, Balance training, Gait training, Patient/Family education, Self Care, Joint mobilization, Stair training, Orthotic/Fit training, DME instructions, Aquatic Therapy, Manual therapy, and Re-evaluation  PLAN FOR NEXT SESSION: Initiate HEP for LLE strengthening - esp quad, sit <> stands, standing tolerance/balance. Pt with LLE buckling. Gait with RW. Assess BERG as able and write goals.     Sherlie Ban, PT, DPT 01/26/23 4:02 PM

## 2023-01-26 ENCOUNTER — Ambulatory Visit: Payer: Medicare Other | Attending: Family Medicine | Admitting: Physical Therapy

## 2023-01-26 ENCOUNTER — Ambulatory Visit: Payer: Medicare Other

## 2023-01-26 VITALS — BP 136/67 | HR 74

## 2023-01-26 DIAGNOSIS — M6281 Muscle weakness (generalized): Secondary | ICD-10-CM | POA: Diagnosis not present

## 2023-01-26 DIAGNOSIS — Z9181 History of falling: Secondary | ICD-10-CM | POA: Diagnosis not present

## 2023-01-26 DIAGNOSIS — R2689 Other abnormalities of gait and mobility: Secondary | ICD-10-CM | POA: Diagnosis not present

## 2023-01-26 DIAGNOSIS — I69354 Hemiplegia and hemiparesis following cerebral infarction affecting left non-dominant side: Secondary | ICD-10-CM | POA: Insufficient documentation

## 2023-01-26 DIAGNOSIS — G459 Transient cerebral ischemic attack, unspecified: Secondary | ICD-10-CM | POA: Diagnosis not present

## 2023-01-26 DIAGNOSIS — I639 Cerebral infarction, unspecified: Secondary | ICD-10-CM | POA: Diagnosis not present

## 2023-01-26 DIAGNOSIS — R2681 Unsteadiness on feet: Secondary | ICD-10-CM | POA: Diagnosis not present

## 2023-01-27 LAB — CUP PACEART REMOTE DEVICE CHECK
Date Time Interrogation Session: 20240625153014
Implantable Pulse Generator Implant Date: 20240521

## 2023-02-01 ENCOUNTER — Other Ambulatory Visit: Payer: Self-pay | Admitting: Physician Assistant

## 2023-02-02 ENCOUNTER — Ambulatory Visit: Payer: Medicare Other | Attending: Family Medicine | Admitting: Physical Therapy

## 2023-02-02 VITALS — BP 108/74 | HR 72

## 2023-02-02 DIAGNOSIS — M6281 Muscle weakness (generalized): Secondary | ICD-10-CM | POA: Insufficient documentation

## 2023-02-02 DIAGNOSIS — R2689 Other abnormalities of gait and mobility: Secondary | ICD-10-CM | POA: Insufficient documentation

## 2023-02-02 DIAGNOSIS — Z9181 History of falling: Secondary | ICD-10-CM | POA: Diagnosis not present

## 2023-02-02 DIAGNOSIS — I639 Cerebral infarction, unspecified: Secondary | ICD-10-CM | POA: Insufficient documentation

## 2023-02-02 DIAGNOSIS — I69354 Hemiplegia and hemiparesis following cerebral infarction affecting left non-dominant side: Secondary | ICD-10-CM | POA: Insufficient documentation

## 2023-02-02 DIAGNOSIS — R2681 Unsteadiness on feet: Secondary | ICD-10-CM | POA: Diagnosis not present

## 2023-02-02 NOTE — Therapy (Signed)
OUTPATIENT PHYSICAL THERAPY NEURO TREATMENT   Patient Name: Tyler Guerra. MRN: [id number] DOB:05/13/1951, 72 y.o., male Today's Date: 02/02/2023   PCP: Joaquim Nam, MD  REFERRING PROVIDER: Merlene Laughter, DO  END OF SESSION:  PT End of Session - 02/02/23 1451     Visit Number 2    Number of Visits 17    Date for PT Re-Evaluation 03/27/23    Authorization Type Medicare -Part A & B    PT Start Time 1448    PT Stop Time 1527    PT Time Calculation (min) 39 min    Equipment Utilized During Treatment Gait belt    Activity Tolerance Patient tolerated treatment well    Behavior During Therapy WFL for tasks assessed/performed              Past Medical History:  Diagnosis Date   Anxiety state 03/16/2016   Aphasia 01/26/2017   Benign localized prostatic hyperplasia with lower urinary tract symptoms (LUTS)    BPV (benign positional vertigo) 11/02/2014   Carotid artery stenosis    PER DUPLEX 10-22-2015  BILATERAL ICA 1-39%   Cataract    Coronary artery disease    CARDIOLOGIST-  DR Verdis Prime--  VISIT EVERY OTHER YEAR-- LOV 2015   Coronary atherosclerosis 07/19/2010   Qualifier: Diagnosis of  By: Para March MD, Graham     Depression 01/26/2017   Diabetes mellitus without complication (HCC) 03/02/2018   GERD 07/19/2010   Qualifier: Diagnosis of  By: Para March MD, Cheree Ditto     GERD (gastroesophageal reflux disease)    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   Gout    Gout 07/18/2010   Qualifier: Diagnosis of  By: Para March MD, Cheree Ditto     H/O eye injury    chronic changes to left eye after injury   Hand weakness 03/02/2018   Hearing loss 04/21/2013   History of TIA (transient ischemic attack)    03/ 2017  no residual after brief episode loss peripheral vision   HOH (hard of hearing)    Hyperglycemia 05/17/2016   Hyperlipemia    Hyperlipidemia 07/18/2010   Qualifier: Diagnosis of  By: Para March MD, Graham     Hypertension    HYPERTENSION, BENIGN ESSENTIAL 07/18/2010   Qualifier:  Diagnosis of  By: Para March MD, Graham     Hypokalemia 01/26/2017   Knee pain 03/13/2019   Left shoulder pain 09/04/2018   Pneumothorax 10/11/2019   RIGHT    Prostate cancer (HCC) UROLOGIST-  DR GRAPEY/  ONCOLOGIST-  DR MANNING   dx 2015--- Stage T1c, Gleason 3+4, PSA 4.03, vol 44cc   PVC's (premature ventricular contractions)    Rash and nonspecific skin eruption 10/14/2015   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   S/P CABG x 05 Dec 1998   Skin lesion 10/14/2015   Stroke [facility])    TIA (transient ischemic attack) 10/14/2015   Ventral hernia 10/26/2011   Wears glasses    Past Surgical History:  Procedure Laterality Date   APPENDECTOMY  1975   CARDIAC CATHETERIZATION  12-22-2001   dr Verdis Prime   severe native vessel disease mLAD 60-70%,  total occulsion pCFX  and pRCA/  widely patent saphenous vein , free radial , and LIMA grafts/  minminal lv dysfunction, ef 60%   CHEST TUBE INSERTION Right 10/11/2019   CORONARY ARTERY BYPASS GRAFT  12/1998   LIMA to LAD,  SVG to PDA and Diagonal, Free radial graft to OM   CORONARY STENT  INTERVENTION N/A 05/26/2021   Procedure: CORONARY STENT INTERVENTION;  Surgeon: Orbie Pyo, MD;  Location: [facility];  Service: Cardiovascular;  Laterality: N/A;   Exericse treadmill test  last one 01-03-2014  dr Verdis Prime   normal exercise tolerance w/ hypertensive repsonse,  no ischemic EKG changes, appropriate HR response & recovery ([facility] TM score 9;  Low Risk , PVC's w/ exertion)   EYE SURGERY     FRACTURE SURGERY     LEFT HEART CATH AND CORS/GRAFTS ANGIOGRAPHY N/A 05/26/2021   Procedure: LEFT HEART CATH AND CORS/GRAFTS ANGIOGRAPHY;  Surgeon: Orbie Pyo, MD;  Location: [facility];  Service: Cardiovascular;  Laterality: N/A;   LOOP RECORDER INSERTION N/A 12/22/2022   Procedure: LOOP RECORDER INSERTION;  Surgeon: Marinus Maw, MD;  Location: [facility];  Service: Cardiovascular;  Laterality: N/A;   PROSTATE  BIOPSY     RADIOACTIVE SEED IMPLANT N/A 01/13/2016   Procedure: RADIOACTIVE SEED IMPLANT/BRACHYTHERAPY IMPLANT;  Surgeon: Barron Alvine, MD;  Location: [facility];  Service: Urology;  Laterality: N/A;   Patient Active Problem List   Diagnosis Date Noted   Acute CVA (cerebrovascular accident) (HCC) 12/22/2022   CVA (cerebral vascular accident) (HCC) 12/22/2022   Constipation 03/04/2022   CAD (coronary artery disease) 05/24/2021   NSTEMI (non-ST elevated myocardial infarction) (HCC) 05/24/2021   Pneumothorax 10/11/2019   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   Knee joint symptom 03/13/2019   Left shoulder pain 09/04/2018   Diabetes mellitus without complication (HCC) 03/02/2018   Anxiety state 03/16/2016   Pseudophakia of both eyes 02/24/2016   TIA (transient ischemic attack) 10/14/2015   BPV (benign positional vertigo) 11/02/2014   Advance care planning 05/13/2014   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   Hearing loss 04/21/2013   Ventral hernia 10/26/2011   Medicare annual wellness visit, subsequent 10/26/2011   Prostate cancer (HCC) 10/26/2011   Coronary atherosclerosis 07/19/2010   GERD 07/19/2010   Hyperlipidemia 07/18/2010   Gout 07/18/2010   Hypertension 07/18/2010    ONSET DATE: 12/22/2022  REFERRING DIAG: I63.9 (ICD-10-CM) - Acute CVA (cerebrovascular accident) (HCC)  THERAPY DIAG:  Unsteadiness on feet  Hemiplegia and hemiparesis following cerebral infarction affecting left non-dominant side (HCC)  Other abnormalities of gait and mobility  Muscle weakness (generalized)  History of falling  Acute CVA (cerebrovascular accident) (HCC)  Rationale for Evaluation and Treatment: Rehabilitation  SUBJECTIVE:  SUBJECTIVE STATEMENT: Pt reports he did have a fall  since last visit (he slid off of the toilet while bathing), reports no injuries, was able to get back up from the ground after getting onto his knees. No pain today. Pt conitnues to have legs giving out on him most times that he is standing. Pt's wife has checked his BP when pt is standing and gets dizzy and legs start to give out and starts to fall to the L and it has dropped. Pt reports only dizziness when initially standing up, none with rolling or positional changes. Pt does not do much walking at home due to LLE giving out and falling to his L side.  Pt accompanied by:  [name]    PERTINENT HISTORY: Pt is 71 yo male who presented on 12/21/22 with intermittent L leg weakness past 3 weeks. 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 Pt discharged on 12/22/22. Pt with hx including but not limited to CAD, HTN, TIA, anxiety, BPPV, HLD, gout, prostate CA, CABG and stents.  Pt also hard of hearing.   Vitals:   02/02/23 1717 02/02/23 1718  BP: 121/72 108/74  Pulse: 70 72      PAIN:  Are you having pain? No  PRECAUTIONS: Fall  WEIGHT BEARING RESTRICTIONS: No  FALLS: Has patient fallen in last 6 months? Yes. Number of falls 5, denies injury, just some bruises   LIVING ENVIRONMENT: Lives with: lives with their spouse and has family closeby and grand son is always home  Lives in: House/apartment Stairs: Yes: Internal: 12 steps; on right going up, level entry into house  Has following equipment at home: Dan Humphreys - 2 wheeled, Environmental consultant - 4 wheeled, shower chair, and Uses standard 2 wheeled RW, uses rollator out in the community as he can sit in it.   PLOF: Independent and Leisure: enjoys biking and his riding his UTV  PATIENT GOALS: Wants to build more strength in L leg  OBJECTIVE:   DIAGNOSTIC FINDINGS: MRI brain wo contrast 12/21/2022: Intermediate sized area of acute ischemia within the right anterior cerebral artery territory. No hemorrhage or mass effect.    COGNITION: Overall cognitive status: Within functional limits for tasks assessed   SENSATION: Light touch: WFL  Reports some tingling in feet at night.   COORDINATION: Heel to shin: slightly slower with LLE   RAM with BUE: impaired coordination with LUE   POSTURE: rounded shoulders, forward head, and weight shift right  LOWER EXTREMITY ROM:     Decr LLE knee extension AROM due to weakness  LOWER EXTREMITY MMT:    MMT Right Eval Left Eval  Hip flexion 5 4+  Hip extension    Hip abduction 5 5  Hip adduction 5 5  Hip internal rotation    Hip external rotation    Knee flexion 5 5  Knee extension 5 3  Ankle dorsiflexion 5 4  Ankle plantarflexion    Ankle inversion    Ankle eversion    (Blank rows = not tested)  All tested in sitting.   BED MOBILITY:  Pt reports no difficulty getting in and out of the bed.   TRANSFERS: Assistive device utilized: Environmental consultant - 2 wheeled  Sit to stand: SBA and CGA Stand to sit: SBA and CGA RW in front of pt, cued to push up from chair vs. RW Pt with RLE staggered behind L, able to perform a few reps with no UE support, pt with incr  quad tremors with eccentric control back to chair   GAIT: Gait pattern: decreased step length- Right, decreased stance time- Left, decreased stride length, trunk flexed, and poor foot clearance- Left Distance walked: approx. 40'  Assistive device utilized: Environmental consultant - 2 wheeled Level of assistance: CGA and Min A Comments: with w/c follow, pt with L knee instability, one episode of LLE almost buckling  Pt's family reports this is farther than he normally walks at home   FUNCTIONAL TESTS:  5 times sit to stand: 24.5 seconds with no UE support, RLE staggered behind L "this feels as bad as doing 5 push-ups" 10 meter walk test: 44.22 seconds with RW = .74 ft/sec, pt needing one brief standing rest break due to LLE weakness and felt like LLE could buckle   PATIENT SURVEYS:  FOTO Staff did not capture    TODAY'S TREATMENT:                                                                                                                               [facility] PT Assessment - 02/02/23 1512       Standardized Balance Assessment   Standardized Balance Assessment Berg Balance Test      Berg Balance Test   Sit to Stand Needs minimal aid to stand or to stabilize    Standing Unsupported Unable to stand 30 seconds unassisted    Sitting with Back Unsupported but Feet Supported on Floor or Stool Unable to sit without support 10 seconds    Stand to Sit Needs assistance to sit    Transfers Needs one person to assist    Standing Unsupported with Eyes Closed Needs help to keep from falling    Standing Unsupported with Feet Together Needs help to attain position and unable to hold for 15 seconds    From Standing, Reach Forward with Outstretched Arm Loses balance while trying/requires external support    From Standing Position, Pick up Object from Floor Unable to try/needs assist to keep balance    From Standing Position, Turn to Look Behind Over each Shoulder Needs assist to keep from losing balance and falling    Turn 360 Degrees Needs assistance while turning    Standing Unsupported, Alternately Place Feet on Step/Stool Needs assistance to keep from falling or unable to try    Standing Unsupported, One Foot in [employer] balance while stepping or standing    Standing on One Leg Unable to try or needs assist to prevent fall    Total Score 2            Sit to stands x 5 reps with focus on hands on thighs, anterior weight shift, maintaining midline Seated LAQ 3 x 5 reps with 5 sec hold  Added to HEP, see bolded below  BP assessed initially in sitting, then in standing, then once sitting again. See below: Sitting: 121/72, HR 70 Standing:  146/84, HR 45 Sitting:  108/74, HR 72   PATIENT EDUCATION: Education details: initiated HEP Person educated: Patient and Spouse Education method:  Explanation, Demonstration, Actor cues, Verbal cues, and Handouts Education comprehension: verbalized understanding, returned demonstration, and needs further education  HOME EXERCISE PROGRAM: Access Code: [email or web address] URL: [email or web address] Date: 02/02/2023 Prepared by: Peter Congo  Exercises - Sit to Stand  - 1 x daily - 7 x weekly - 3 sets - 5 reps - Seated Long Arc Quad  - 1 x daily - 7 x weekly - 3 sets - 5 reps - 5 sec hold  GOALS: Goals reviewed with patient? Yes  SHORT TERM GOALS: Target date: 02/23/2023  Pt will be independent with initial HEP with family supervision in order to build upon functional gains made in therapy. Baseline: Goal status: INITIAL  2.  Pt and pt's family will verbalize understanding on fall prevention in the home.  Baseline:  Goal status: INITIAL  3.  Pt will improve Berg score to 6/56 for decreased fall risk Baseline: 2/56 (7/2) Goal status: INITIAL  4. Pt will improve 5x sit<>stand to less than or equal to 20 sec with no UE support to demonstrate improved functional strength and transfer efficiency.  Baseline: 24.5 seconds with no UE support Goal status: INITIAL  5.  Pt will improve gait speed with RW to at least 1.2 ft/sec in order to demo decr fall risk.  Baseline: 44.22 seconds with RW = .74 ft/sec, pt needing one brief standing rest break due to LLE weakness and felt like LLE could buckle Goal status: INITIAL  6.  Pt will be able to stand for 5 minutes with supervision in order to improve standing tolerance for ADLs.  Baseline: Pt's spouse states pt can not stand for too long due to fatigue/weakness  Goal status: INITIAL  LONG TERM GOALS: Target date: 03/23/2023  Pt will be independent with final HEP with family supervision in order to build upon functional gains made in therapy. Baseline:  Goal status: INITIAL  2.  Pt will improve Berg score to 10/56 for decreased fall risk Baseline: 2/56 (7/2) Goal status:  INITIAL  3.  Pt will improve gait speed with RW vs. LRAD to at least 1.8 ft/sec in order to demo improved household mobility/decr fall risk  Baseline: 44.22 seconds with RW = .74 ft/sec, pt needing one brief standing rest break due to LLE weakness and felt like LLE could buckle Goal status: INITIAL  4.  Pt will ambulate at least 400' with LRAD and supervision (over indoor/outdoor paved surfaces) in order to demo improved household/community mobility.  Baseline:  24.5 seconds with no UE support Goal status: INITIAL  5.  Pt will improve 5x sit<>stand to less than or equal to 16 sec with no UE support to demonstrate improved functional strength and transfer efficiency.  Baseline:  Goal status: INITIAL    ASSESSMENT:  CLINICAL IMPRESSION: Emphasis of skilled PT session on attempting to perform Berg Balance test assessment and initiating HEP with safe activities that pt is able to work on at home. Pt scores very poorly on the Berg due to inability to stand without UE support on the RW without immediately falling to his L side. Even in standing with RW pt has gradual onset of leaning to the L and he is unable to correct this to prevent a fall independently. Pt with fluctuating BP during session as noted above but no obvious signs of OH in standing. Pt with poor unsupported sitting and standing balance and while  seated on mat table without UE support he also has LOB to his L side, needs to cues to brace himself with his LUE. Pt continues to benefit from skilled therapy services due to impaired balance, impaired LE strength, and decreased safety and independence with functional mobility leading to increased fall risk. Continue POC.    OBJECTIVE IMPAIRMENTS: Abnormal gait, decreased activity tolerance, decreased balance, decreased coordination, decreased endurance, decreased knowledge of use of DME, decreased mobility, difficulty walking, decreased ROM, decreased strength, and postural dysfunction.    ACTIVITY LIMITATIONS: carrying, lifting, bending, standing, stairs, transfers, and locomotion level  PARTICIPATION LIMITATIONS: cleaning, driving, shopping, community activity, and yard work  PERSONAL FACTORS: Age, Behavior pattern, Past/current experiences, Time since onset of injury/illness/exacerbation, and 3+ comorbidities: CAD, HTN, TIA, anxiety, BPPV, HLD, gout, prostate CA, CABG and stents  are also affecting patient's functional outcome.   REHAB POTENTIAL: Good  CLINICAL DECISION MAKING: Evolving/moderate complexity  EVALUATION COMPLEXITY: Moderate  PLAN:  PT FREQUENCY: 2x/week  PT DURATION: 8 weeks  PLANNED INTERVENTIONS: Therapeutic exercises, Therapeutic activity, Neuromuscular re-education, Balance training, Gait training, Patient/Family education, Self Care, Joint mobilization, Stair training, Orthotic/Fit training, DME instructions, Aquatic Therapy, Manual therapy, and Re-evaluation  PLAN FOR NEXT SESSION: Initiate HEP for LLE strengthening - esp L quad, standing tolerance/balance. Pt with LLE buckling and L lateral lean in unsupported sitting and standing. Gait with RW. Work on sitting balance/maintaining midline and adding safer exercises to HEP   Peter Congo, PT, DPT, CSRS  02/02/23 5:17 PM

## 2023-02-05 ENCOUNTER — Ambulatory Visit: Payer: Medicare Other | Admitting: Physical Therapy

## 2023-02-09 ENCOUNTER — Telehealth: Payer: Self-pay | Admitting: Family Medicine

## 2023-02-09 ENCOUNTER — Ambulatory Visit: Payer: Medicare Other | Admitting: Physical Therapy

## 2023-02-09 ENCOUNTER — Encounter: Payer: Self-pay | Admitting: Physical Therapy

## 2023-02-09 VITALS — BP 100/66

## 2023-02-09 DIAGNOSIS — M6281 Muscle weakness (generalized): Secondary | ICD-10-CM

## 2023-02-09 DIAGNOSIS — I69354 Hemiplegia and hemiparesis following cerebral infarction affecting left non-dominant side: Secondary | ICD-10-CM | POA: Diagnosis not present

## 2023-02-09 DIAGNOSIS — R2689 Other abnormalities of gait and mobility: Secondary | ICD-10-CM

## 2023-02-09 DIAGNOSIS — R2681 Unsteadiness on feet: Secondary | ICD-10-CM | POA: Diagnosis not present

## 2023-02-09 DIAGNOSIS — Z9181 History of falling: Secondary | ICD-10-CM | POA: Diagnosis not present

## 2023-02-09 DIAGNOSIS — I639 Cerebral infarction, unspecified: Secondary | ICD-10-CM | POA: Diagnosis not present

## 2023-02-09 MED ORDER — LOSARTAN POTASSIUM-HCTZ 50-12.5 MG PO TABS: 0.5000 | ORAL_TABLET | Freq: Every day | ORAL | Status: DC

## 2023-02-09 NOTE — Telephone Encounter (Signed)
Please verify that he is taking losartan/hydrochlorothiazide 50/12.5mg , 1 tab a day.  If so, would dec to 1/2 tab a day.  Please update me about BP in about 1 week.  Thanks.

## 2023-02-09 NOTE — Telephone Encounter (Signed)
Pt's wife notified of Dr. Lianne Bushy plan and verbalized understanding

## 2023-02-09 NOTE — Telephone Encounter (Signed)
Patients wife called in today stating that it seems like every time her husband stands his b/p drops.Today when he went to PT,it dropped to 73/58. She is wondering if he possibly needs to adjust his b/p medication?

## 2023-02-09 NOTE — Therapy (Signed)
OUTPATIENT PHYSICAL THERAPY NEURO TREATMENT   Patient Name: Tyler Guerra. MRN: [id number] DOB:10/12/1950, 72 y.o., male Today's Date: 02/09/2023   PCP: Joaquim Nam, MD  REFERRING PROVIDER: Merlene Laughter, DO  END OF SESSION:  PT End of Session - 02/09/23 1320     Visit Number 3    Number of Visits 17    Date for PT Re-Evaluation 03/27/23    Authorization Type Medicare -Part A & B    PT Start Time 1318    PT Stop Time 1401    PT Time Calculation (min) 43 min    Equipment Utilized During Treatment Gait belt    Activity Tolerance Patient tolerated treatment well   limited by low BP/lightheadedness   Behavior During Therapy [facility] for tasks assessed/performed              Past Medical History:  Diagnosis Date   Anxiety state 03/16/2016   Aphasia 01/26/2017   Benign localized prostatic hyperplasia with lower urinary tract symptoms (LUTS)    BPV (benign positional vertigo) 11/02/2014   Carotid artery stenosis    PER DUPLEX 10-22-2015  BILATERAL ICA 1-39%   Cataract    Coronary artery disease    CARDIOLOGIST-  DR Verdis Prime--  VISIT EVERY OTHER YEAR-- LOV 2015   Coronary atherosclerosis 07/19/2010   Qualifier: Diagnosis of  By: Para March MD, Graham     Depression 01/26/2017   Diabetes mellitus without complication (HCC) 03/02/2018   GERD 07/19/2010   Qualifier: Diagnosis of  By: Para March MD, Cheree Ditto     GERD (gastroesophageal reflux disease)    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   Gout    Gout 07/18/2010   Qualifier: Diagnosis of  By: Para March MD, Cheree Ditto     H/O eye injury    chronic changes to left eye after injury   Hand weakness 03/02/2018   Hearing loss 04/21/2013   History of TIA (transient ischemic attack)    03/ 2017  no residual after brief episode loss peripheral vision   HOH (hard of hearing)    Hyperglycemia 05/17/2016   Hyperlipemia    Hyperlipidemia 07/18/2010   Qualifier: Diagnosis of  By: Para March MD, Graham     Hypertension    HYPERTENSION, BENIGN  ESSENTIAL 07/18/2010   Qualifier: Diagnosis of  By: Para March MD, Graham     Hypokalemia 01/26/2017   Knee pain 03/13/2019   Left shoulder pain 09/04/2018   Pneumothorax 10/11/2019   RIGHT    Prostate cancer (HCC) UROLOGIST-  DR GRAPEY/  ONCOLOGIST-  DR MANNING   dx 2015--- Stage T1c, Gleason 3+4, PSA 4.03, vol 44cc   PVC's (premature ventricular contractions)    Rash and nonspecific skin eruption 10/14/2015   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   S/P CABG x 05 Dec 1998   Skin lesion 10/14/2015   Stroke [facility])    TIA (transient ischemic attack) 10/14/2015   Ventral hernia 10/26/2011   Wears glasses    Past Surgical History:  Procedure Laterality Date   APPENDECTOMY  1975   CARDIAC CATHETERIZATION  12-22-2001   dr Verdis Prime   severe native vessel disease mLAD 60-70%,  total occulsion pCFX  and pRCA/  widely patent saphenous vein , free radial , and LIMA grafts/  minminal lv dysfunction, ef 60%   CHEST TUBE INSERTION Right 10/11/2019   CORONARY ARTERY BYPASS GRAFT  12/1998   LIMA to LAD,  SVG to PDA and Diagonal, Free radial graft to  OM   CORONARY STENT INTERVENTION N/A 05/26/2021   Procedure: CORONARY STENT INTERVENTION;  Surgeon: Orbie Pyo, MD;  Location: [facility];  Service: Cardiovascular;  Laterality: N/A;   Exericse treadmill test  last one 01-03-2014  dr Verdis Prime   normal exercise tolerance w/ hypertensive repsonse,  no ischemic EKG changes, appropriate HR response & recovery ([facility] TM score 9;  Low Risk , PVC's w/ exertion)   EYE SURGERY     FRACTURE SURGERY     LEFT HEART CATH AND CORS/GRAFTS ANGIOGRAPHY N/A 05/26/2021   Procedure: LEFT HEART CATH AND CORS/GRAFTS ANGIOGRAPHY;  Surgeon: Orbie Pyo, MD;  Location: [facility];  Service: Cardiovascular;  Laterality: N/A;   LOOP RECORDER INSERTION N/A 12/22/2022   Procedure: LOOP RECORDER INSERTION;  Surgeon: Marinus Maw, MD;  Location: [facility];  Service:  Cardiovascular;  Laterality: N/A;   PROSTATE BIOPSY     RADIOACTIVE SEED IMPLANT N/A 01/13/2016   Procedure: RADIOACTIVE SEED IMPLANT/BRACHYTHERAPY IMPLANT;  Surgeon: Barron Alvine, MD;  Location: [facility];  Service: Urology;  Laterality: N/A;   Patient Active Problem List   Diagnosis Date Noted   Acute CVA (cerebrovascular accident) (HCC) 12/22/2022   CVA (cerebral vascular accident) (HCC) 12/22/2022   Constipation 03/04/2022   CAD (coronary artery disease) 05/24/2021   NSTEMI (non-ST elevated myocardial infarction) (HCC) 05/24/2021   Pneumothorax 10/11/2019   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   Knee joint symptom 03/13/2019   Left shoulder pain 09/04/2018   Diabetes mellitus without complication (HCC) 03/02/2018   Anxiety state 03/16/2016   Pseudophakia of both eyes 02/24/2016   TIA (transient ischemic attack) 10/14/2015   BPV (benign positional vertigo) 11/02/2014   Advance care planning 05/13/2014   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   Hearing loss 04/21/2013   Ventral hernia 10/26/2011   Medicare annual wellness visit, subsequent 10/26/2011   Prostate cancer (HCC) 10/26/2011   Coronary atherosclerosis 07/19/2010   GERD 07/19/2010   Hyperlipidemia 07/18/2010   Gout 07/18/2010   Hypertension 07/18/2010    ONSET DATE: 12/22/2022  REFERRING DIAG: I63.9 (ICD-10-CM) - Acute CVA (cerebrovascular accident) (HCC)  THERAPY DIAG:  Unsteadiness on feet  Hemiplegia and hemiparesis following cerebral infarction affecting left non-dominant side (HCC)  Other abnormalities of gait and mobility  Muscle weakness (generalized)  Rationale for Evaluation and Treatment: Rehabilitation  SUBJECTIVE:                                                                                                                                                                                             SUBJECTIVE STATEMENT:  No new falls. Has been trying his exercises at home.  Notes sometimes being lightheaded and nauseated after being in the car.   Pt accompanied by:  [name], daughter Inetta Fermo    PERTINENT HISTORY: Pt is 72 yo male who presented on 12/21/22 with intermittent L leg weakness past 3 weeks. 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 Pt discharged on 12/22/22. Pt with hx including but not limited to CAD, HTN, TIA, anxiety, BPPV, HLD, gout, prostate CA, CABG and stents.  Pt also hard of hearing.   Vitals:   02/09/23 1331 02/09/23 1352  BP: (!) 94/52 100/66    PAIN:  Are you having pain? No  PRECAUTIONS: Fall  WEIGHT BEARING RESTRICTIONS: No  FALLS: Has patient fallen in last 6 months? Yes. Number of falls 5, denies injury, just some bruises   LIVING ENVIRONMENT: Lives with: lives with their spouse and has family closeby and grand son is always home  Lives in: House/apartment Stairs: Yes: Internal: 12 steps; on right going up, level entry into house  Has following equipment at home: Dan Humphreys - 2 wheeled, Environmental consultant - 4 wheeled, shower chair, and Uses standard 2 wheeled RW, uses rollator out in the community as he can sit in it.   PLOF: Independent and Leisure: enjoys biking and his riding his UTV  PATIENT GOALS: Wants to build more strength in L leg  OBJECTIVE:   DIAGNOSTIC FINDINGS: MRI brain wo contrast 12/21/2022: Intermediate sized area of acute ischemia within the right anterior cerebral artery territory. No hemorrhage or mass effect.   COGNITION: Overall cognitive status: Within functional limits for tasks assessed   SENSATION: Light touch: WFL  Reports some tingling in feet at night.   COORDINATION: Heel to shin: slightly slower with LLE   RAM with BUE: impaired coordination with LUE   POSTURE: rounded shoulders, forward head, and weight shift right  LOWER EXTREMITY ROM:     Decr LLE knee extension AROM due to weakness  LOWER EXTREMITY MMT:    MMT Right Eval Left Eval  Hip flexion 5 4+   Hip extension    Hip abduction 5 5  Hip adduction 5 5  Hip internal rotation    Hip external rotation    Knee flexion 5 5  Knee extension 5 3  Ankle dorsiflexion 5 4  Ankle plantarflexion    Ankle inversion    Ankle eversion    (Blank rows = not tested)  All tested in sitting.   BED MOBILITY:  Pt reports no difficulty getting in and out of the bed.   TRANSFERS: Assistive device utilized: Environmental consultant - 2 wheeled  Sit to stand: SBA and CGA Stand to sit: SBA and CGA RW in front of pt, cued to push up from chair vs. RW Pt with RLE staggered behind L, able to perform a few reps with no UE support, pt with incr quad tremors with eccentric control back to chair   GAIT: Gait pattern: decreased step length- Right, decreased stance time- Left, decreased stride length, trunk flexed, and poor foot clearance- Left Distance walked: approx. 40'  Assistive device utilized: Environmental consultant - 2 wheeled Level of assistance: CGA and Min A Comments: with w/c follow, pt with L knee instability, one episode of LLE almost buckling  Pt's family reports this is farther than he normally walks at home   FUNCTIONAL TESTS:  5 times sit to stand: 24.5 seconds with no UE support, RLE staggered behind L "this  feels as bad as doing 5 push-ups" 10 meter walk test: 44.22 seconds with RW = .74 ft/sec, pt needing one brief standing rest break due to LLE weakness and felt like LLE could buckle   PATIENT SURVEYS:  FOTO Staff did not capture   TODAY'S TREATMENT:                                                                                                                               TherAct Assessed BP in sitting with automatic cuff: read as 78/56 with pt as reporting lightheadedness. Assessed manually (took 2 attempts, plus another therapist had to double check due to it being very faint) reading at 94/52. Provided patient with 2 glasses of water with pt feeling better. Pt's spouse reports they still need to make a visit  with his cardiologist, discussed importance of following up soon as well as trying to see PCP sooner (current appt isn't until mid September) regarding this as it incr pt's fall risk and limits his ability to safely participate in PT if BP is low. Educated on making sure to take time with transitions and staying plenty hydrated at home with water.  Transferred to mat table with RW with CGA, with cued to turn all the way to mat table before sitting down   Therapeutic Exercise: With pt supine on mat table due to lower BP today:  Hip ADD pillow squeezes x10 reps, with 5 second holds, pt initially with legs leaning over to the L when pt was performing, cued pt to look at his legs and making sure that they stayed in the middle. Pt did well with visual cue  Bridging x10 reps, initially with pillow between legs and then none, cues for full ROM With LLE, straight leg raises, verbal tactile cues for quad set first and then lifting halfway and lowering slowly to mat table for eccentric control x15 reps  Performed supine marches, pt with no difficulty with these With LLE over bolster and 3# ankle weight, performed x10 reps SAQs, pt needing cues for keeping his leg on the bolster  Sitting at edge of mat table, with left leg tucked behind right, holding onto chair, gently lean forward and lift bottom off mat and hold for a few seconds and then slowly lower to the mat for incr weight bearing through LLE, performed x5 reps.   Pt sat up at mat table at end of session and reporting feeling better when he came in, assessed BP and was 100/66.   Pt sitting at edge of mat with equal weight bearing and no incr lean to the L side.    PATIENT EDUCATION: Education details: See therapeutic activity section above, additions to HEP (see bolded below) Person educated: Patient and Spouse Education method: Explanation, Demonstration, Tactile cues, Verbal cues, and Handouts Education comprehension: verbalized understanding,  returned demonstration, and needs further education  HOME EXERCISE PROGRAM: Access Code: [email or web address] URL: [email or web address] Date: 02/09/2023 Prepared  by: Sherlie Ban  Program Notes Sitting at edge of mat table, with left leg tucked behind right, holding onto chair, gently lean forward and lift bottom off mat and hold for a few seconds and then slowly lower to the mat.   Exercises - Sit to Stand  - 1 x daily - 7 x weekly - 3 sets - 5 reps - Seated Long Arc Quad  - 1 x daily - 7 x weekly - 3 sets - 5 reps - 5 sec hold - Supine Bridge  - 1 x daily - 7 x weekly - 1-2 sets - 10 reps - Supine Hip Adduction Isometric with Ball  - 1 x daily - 7 x weekly - 1-2 sets - 10 reps  GOALS: Goals reviewed with patient? Yes  SHORT TERM GOALS: Target date: 02/23/2023  Pt will be independent with initial HEP with family supervision in order to build upon functional gains made in therapy. Baseline: Goal status: INITIAL  2.  Pt and pt's family will verbalize understanding on fall prevention in the home.  Baseline:  Goal status: INITIAL  3.  Pt will improve Berg score to 6/56 for decreased fall risk Baseline: 2/56 (7/2) Goal status: INITIAL  4. Pt will improve 5x sit<>stand to less than or equal to 20 sec with no UE support to demonstrate improved functional strength and transfer efficiency.  Baseline: 24.5 seconds with no UE support Goal status: INITIAL  5.  Pt will improve gait speed with RW to at least 1.2 ft/sec in order to demo decr fall risk.  Baseline: 44.22 seconds with RW = .74 ft/sec, pt needing one brief standing rest break due to LLE weakness and felt like LLE could buckle Goal status: INITIAL  6.  Pt will be able to stand for 5 minutes with supervision in order to improve standing tolerance for ADLs.  Baseline: Pt's spouse states pt can not stand for too long due to fatigue/weakness  Goal status: INITIAL  LONG TERM GOALS: Target date: 03/23/2023  Pt will be  independent with final HEP with family supervision in order to build upon functional gains made in therapy. Baseline:  Goal status: INITIAL  2.  Pt will improve Berg score to 10/56 for decreased fall risk Baseline: 2/56 (7/2) Goal status: INITIAL  3.  Pt will improve gait speed with RW vs. LRAD to at least 1.8 ft/sec in order to demo improved household mobility/decr fall risk  Baseline: 44.22 seconds with RW = .74 ft/sec, pt needing one brief standing rest break due to LLE weakness and felt like LLE could buckle Goal status: INITIAL  4.  Pt will ambulate at least 400' with LRAD and supervision (over indoor/outdoor paved surfaces) in order to demo improved household/community mobility.  Baseline:  24.5 seconds with no UE support Goal status: INITIAL  5.  Pt will improve 5x sit<>stand to less than or equal to 16 sec with no UE support to demonstrate improved functional strength and transfer efficiency.  Baseline:  Goal status: INITIAL    ASSESSMENT:  CLINICAL IMPRESSION: Pt's BP lower in seated at start of session with pt reporting feeling lightheaded at 94/52. Provided pt with water (pt reports he did not drink enough today) and performed exercises in supine today for safety and added other exercises to HEP. Pt reporting feeling better at end of session and BP incr to 100/66. Educated importance of following up with cardiologist/PCP regarding his low blood pressures. At end of session when sitting upright after  supine exercises, pt with more midline orientation and did not have his weight shifted to the L. Will continue to progress towards LTGs.     OBJECTIVE IMPAIRMENTS: Abnormal gait, decreased activity tolerance, decreased balance, decreased coordination, decreased endurance, decreased knowledge of use of DME, decreased mobility, difficulty walking, decreased ROM, decreased strength, and postural dysfunction.   ACTIVITY LIMITATIONS: carrying, lifting, bending, standing, stairs,  transfers, and locomotion level  PARTICIPATION LIMITATIONS: cleaning, driving, shopping, community activity, and yard work  PERSONAL FACTORS: Age, Behavior pattern, Past/current experiences, Time since onset of injury/illness/exacerbation, and 3+ comorbidities: CAD, HTN, TIA, anxiety, BPPV, HLD, gout, prostate CA, CABG and stents  are also affecting patient's functional outcome.   REHAB POTENTIAL: Good  CLINICAL DECISION MAKING: Evolving/moderate complexity  EVALUATION COMPLEXITY: Moderate  PLAN:  PT FREQUENCY: 2x/week  PT DURATION: 8 weeks  PLANNED INTERVENTIONS: Therapeutic exercises, Therapeutic activity, Neuromuscular re-education, Balance training, Gait training, Patient/Family education, Self Care, Joint mobilization, Stair training, Orthotic/Fit training, DME instructions, Aquatic Therapy, Manual therapy, and Re-evaluation  PLAN FOR NEXT SESSION: CHECK BP!!! Add to HEP as appropriate. Pt with LLE buckling and L lateral lean in unsupported sitting and standing. Gait with RW. Work on sitting balance/maintaining midline and adding safer exercises to HEP. Try [employer] ?   Sherlie Ban, PT, DPT 02/09/23 2:59 PM

## 2023-02-12 ENCOUNTER — Ambulatory Visit: Payer: Medicare Other | Admitting: Physical Therapy

## 2023-02-12 VITALS — BP 117/75 | HR 59

## 2023-02-12 DIAGNOSIS — Z9181 History of falling: Secondary | ICD-10-CM

## 2023-02-12 DIAGNOSIS — I69354 Hemiplegia and hemiparesis following cerebral infarction affecting left non-dominant side: Secondary | ICD-10-CM

## 2023-02-12 DIAGNOSIS — R2681 Unsteadiness on feet: Secondary | ICD-10-CM | POA: Diagnosis not present

## 2023-02-12 DIAGNOSIS — M6281 Muscle weakness (generalized): Secondary | ICD-10-CM

## 2023-02-12 DIAGNOSIS — R2689 Other abnormalities of gait and mobility: Secondary | ICD-10-CM

## 2023-02-12 DIAGNOSIS — I639 Cerebral infarction, unspecified: Secondary | ICD-10-CM

## 2023-02-12 NOTE — Therapy (Signed)
OUTPATIENT PHYSICAL THERAPY NEURO TREATMENT   Patient Name: Tyler Guerra. MRN: [id number] DOB:1951-06-18, 72 y.o., male Today's Date: 02/12/2023   PCP: Joaquim Nam, MD  REFERRING PROVIDER: Merlene Laughter, DO  END OF SESSION:  PT End of Session - 02/12/23 1314     Visit Number 4    Number of Visits 17    Date for PT Re-Evaluation 03/27/23    Authorization Type Medicare -Part A & B    PT Start Time 1314    PT Stop Time 1400    PT Time Calculation (min) 46 min    Equipment Utilized During Treatment Gait belt    Activity Tolerance Patient tolerated treatment well   limited by low BP/lightheadedness   Behavior During Therapy [facility] for tasks assessed/performed               Past Medical History:  Diagnosis Date   Anxiety state 03/16/2016   Aphasia 01/26/2017   Benign localized prostatic hyperplasia with lower urinary tract symptoms (LUTS)    BPV (benign positional vertigo) 11/02/2014   Carotid artery stenosis    PER DUPLEX 10-22-2015  BILATERAL ICA 1-39%   Cataract    Coronary artery disease    CARDIOLOGIST-  DR Verdis Prime--  VISIT EVERY OTHER YEAR-- LOV 2015   Coronary atherosclerosis 07/19/2010   Qualifier: Diagnosis of  By: Para March MD, Graham     Depression 01/26/2017   Diabetes mellitus without complication (HCC) 03/02/2018   GERD 07/19/2010   Qualifier: Diagnosis of  By: Para March MD, Cheree Ditto     GERD (gastroesophageal reflux disease)    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   Gout    Gout 07/18/2010   Qualifier: Diagnosis of  By: Para March MD, Cheree Ditto     H/O eye injury    chronic changes to left eye after injury   Hand weakness 03/02/2018   Hearing loss 04/21/2013   History of TIA (transient ischemic attack)    03/ 2017  no residual after brief episode loss peripheral vision   HOH (hard of hearing)    Hyperglycemia 05/17/2016   Hyperlipemia    Hyperlipidemia 07/18/2010   Qualifier: Diagnosis of  By: Para March MD, Graham     Hypertension    HYPERTENSION,  BENIGN ESSENTIAL 07/18/2010   Qualifier: Diagnosis of  By: Para March MD, Graham     Hypokalemia 01/26/2017   Knee pain 03/13/2019   Left shoulder pain 09/04/2018   Pneumothorax 10/11/2019   RIGHT    Prostate cancer (HCC) UROLOGIST-  DR GRAPEY/  ONCOLOGIST-  DR MANNING   dx 2015--- Stage T1c, Gleason 3+4, PSA 4.03, vol 44cc   PVC's (premature ventricular contractions)    Rash and nonspecific skin eruption 10/14/2015   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   S/P CABG x 05 Dec 1998   Skin lesion 10/14/2015   Stroke [facility])    TIA (transient ischemic attack) 10/14/2015   Ventral hernia 10/26/2011   Wears glasses    Past Surgical History:  Procedure Laterality Date   APPENDECTOMY  1975   CARDIAC CATHETERIZATION  12-22-2001   dr Verdis Prime   severe native vessel disease mLAD 60-70%,  total occulsion pCFX  and pRCA/  widely patent saphenous vein , free radial , and LIMA grafts/  minminal lv dysfunction, ef 60%   CHEST TUBE INSERTION Right 10/11/2019   CORONARY ARTERY BYPASS GRAFT  12/1998   LIMA to LAD,  SVG to PDA and Diagonal, Free radial graft  to OM   CORONARY STENT INTERVENTION N/A 05/26/2021   Procedure: CORONARY STENT INTERVENTION;  Surgeon: Orbie Pyo, MD;  Location: [facility] INVAS[facility];  Service: Cardiovascular;  Laterality: N/A;   Exericse treadmill test  last one 01-03-2014  dr Verdis Prime   normal exercise tolerance w/ hypertensive repsonse,  no ischemic EKG changes, appropriate HR response & recovery ([facility] TM score 9;  Low Risk , PVC's w/ exertion)   EYE SURGERY     FRACTURE SURGERY     LEFT HEART CATH AND CORS/GRAFTS ANGIOGRAPHY N/A 05/26/2021   Procedure: LEFT HEART CATH AND CORS/GRAFTS ANGIOGRAPHY;  Surgeon: Orbie Pyo, MD;  Location: [facility];  Service: Cardiovascular;  Laterality: N/A;   LOOP RECORDER INSERTION N/A 12/22/2022   Procedure: LOOP RECORDER INSERTION;  Surgeon: Marinus Maw, MD;  Location: [facility];  Service:  Cardiovascular;  Laterality: N/A;   PROSTATE BIOPSY     RADIOACTIVE SEED IMPLANT N/A 01/13/2016   Procedure: RADIOACTIVE SEED IMPLANT/BRACHYTHERAPY IMPLANT;  Surgeon: Barron Alvine, MD;  Location: [facility];  Service: Urology;  Laterality: N/A;   Patient Active Problem List   Diagnosis Date Noted   Acute CVA (cerebrovascular accident) (HCC) 12/22/2022   CVA (cerebral vascular accident) (HCC) 12/22/2022   Constipation 03/04/2022   CAD (coronary artery disease) 05/24/2021   NSTEMI (non-ST elevated myocardial infarction) (HCC) 05/24/2021   Pneumothorax 10/11/2019   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   Knee joint symptom 03/13/2019   Left shoulder pain 09/04/2018   Diabetes mellitus without complication (HCC) 03/02/2018   Anxiety state 03/16/2016   Pseudophakia of both eyes 02/24/2016   TIA (transient ischemic attack) 10/14/2015   BPV (benign positional vertigo) 11/02/2014   Advance care planning 05/13/2014   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   Hearing loss 04/21/2013   Ventral hernia 10/26/2011   Medicare annual wellness visit, subsequent 10/26/2011   Prostate cancer (HCC) 10/26/2011   Coronary atherosclerosis 07/19/2010   GERD 07/19/2010   Hyperlipidemia 07/18/2010   Gout 07/18/2010   Hypertension 07/18/2010    ONSET DATE: 12/22/2022  REFERRING DIAG: I63.9 (ICD-10-CM) - Acute CVA (cerebrovascular accident) (HCC)  THERAPY DIAG:  Unsteadiness on feet  Other abnormalities of gait and mobility  Hemiplegia and hemiparesis following cerebral infarction affecting left non-dominant side (HCC)  Muscle weakness (generalized)  History of falling  Acute CVA (cerebrovascular accident) (HCC)  Rationale for Evaluation and Treatment: Rehabilitation  SUBJECTIVE:  SUBJECTIVE  STATEMENT: Pt reports one fall since last visit, was walking to the bathroom and didn't quite make it. He fell on his knees and his wife helped him to get back up, reports no injuries. Pt had this fall the same day of his last therapy session, later that afternoon. Pt did reach out to his doctor about BP, meds have been reduced and he has been feeling much better.  Pt accompanied by:  [name]   PERTINENT HISTORY: Pt is 72 yo male who presented on 12/21/22 with intermittent L leg weakness past 3 weeks. 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 Pt discharged on 12/22/22. Pt with hx including but not limited to CAD, HTN, TIA, anxiety, BPPV, HLD, gout, prostate CA, CABG and stents.  Pt also hard of hearing.   Vitals:   02/12/23 1318 02/12/23 1349  BP: 116/73 117/75  Pulse: (!) 59 (!) 59     PAIN:  Are you having pain? No  PRECAUTIONS: Fall  WEIGHT BEARING RESTRICTIONS: No  FALLS: Has patient fallen in last 6 months? Yes. Number of falls 5, denies injury, just some bruises   LIVING ENVIRONMENT: Lives with: lives with their spouse and has family closeby and grand son is always home  Lives in: House/apartment Stairs: Yes: Internal: 12 steps; on right going up, level entry into house  Has following equipment at home: Dan Humphreys - 2 wheeled, Environmental consultant - 4 wheeled, shower chair, and Uses standard 2 wheeled RW, uses rollator out in the community as he can sit in it.   PLOF: Independent and Leisure: enjoys biking and his riding his UTV  PATIENT GOALS: Wants to build more strength in L leg  OBJECTIVE:   DIAGNOSTIC FINDINGS: MRI brain wo contrast 12/21/2022: Intermediate sized area of acute ischemia within the right anterior cerebral artery territory. No hemorrhage or mass effect.   COGNITION: Overall cognitive status: Within functional limits for tasks assessed   SENSATION: Light touch: WFL  Reports some tingling in feet at night.   COORDINATION: Heel  to shin: slightly slower with LLE   RAM with BUE: impaired coordination with LUE   POSTURE: rounded shoulders, forward head, and weight shift right  LOWER EXTREMITY ROM:     Decr LLE knee extension AROM due to weakness  LOWER EXTREMITY MMT:    MMT Right Eval Left Eval  Hip flexion 5 4+  Hip extension    Hip abduction 5 5  Hip adduction 5 5  Hip internal rotation    Hip external rotation    Knee flexion 5 5  Knee extension 5 3  Ankle dorsiflexion 5 4  Ankle plantarflexion    Ankle inversion    Ankle eversion    (Blank rows = not tested)  All tested in sitting.   BED MOBILITY:  Pt reports no difficulty getting in and out of the bed.   TRANSFERS: Assistive device utilized: Environmental consultant - 2 wheeled  Sit to stand: SBA and CGA Stand to sit: SBA and CGA RW in front of pt, cued to push up from chair vs. RW Pt with RLE staggered behind L, able to perform a few reps with no UE support, pt with incr quad tremors with eccentric control back to chair   GAIT: Gait pattern: decreased step length- Right, decreased stance time- Left, decreased stride length, trunk flexed, and poor foot clearance- Left Distance walked: approx. 40'  Assistive device utilized: Environmental consultant - 2 wheeled Level of assistance: CGA  and Min A Comments: with w/c follow, pt with L knee instability, one episode of LLE almost buckling  Pt's family reports this is farther than he normally walks at home   FUNCTIONAL TESTS:  5 times sit to stand: 24.5 seconds with no UE support, RLE staggered behind L "this feels as bad as doing 5 push-ups" 10 meter walk test: 44.22 seconds with RW = .74 ft/sec, pt needing one brief standing rest break due to LLE weakness and felt like LLE could buckle   PATIENT SURVEYS:  FOTO Staff did not capture   TODAY'S TREATMENT:                                                                                                                               TherAct Seated BP assessed as noted above.  Pt with onset of dizziness following short distance gait in the clinic, resolves with seated rest break.   6 Blaze pods on random setting for improved standing tolerance, SLS, and reaching outside BOS and across midline.  Performed on 1 minute intervals with untimed rest periods based on recovery from dizziness.  Pt requires CGA guarding. Round 1:  4 on mirror, 2 on floor setup.  18 hits. Dizziness 5/10. Round 2:  4 on mirror, 2 on floor  setup.  39 hits. Dizziness 4/10. Round 3:  4 on mirror, 2 on floor  setup.  37 hits. Dizziness 6/10. Notable errors/deficits: decreased attention to LLE   6 Blaze pods on random setting for improved standing tolerance, SLS, LE coordination.  Performed on 1 minute intervals with 30-45 sec rest periods.  Pt requires CGA guarding. Round 1:  lateral line setup.  18 hits. Round 2:  lateral line setup.  16 hits. Round 3:  lateral line setup.  23 hits. Notable errors/deficits:  cross-over stepping, decreased attention to LLE   Therapeutic Exercise: SciFit multi-peaks level 3 for 8 minutes using BUE/BLEs for neural priming for reciprocal movement, dynamic cardiovascular warmup and increased amplitude of stepping.   Gait: Gait pattern: decreased hip/knee flexion- Left and trunk flexed Distance walked: 4 x 50 ft Assistive device utilized: Environmental consultant - 2 wheeled Level of assistance: Min A and Mod A (min A initially, increases to mod A as distance increases) Comments: several instances of LLE buckling and then decreased ability to advance LLE during gait; no fall but needs increased physical assist as gait progresses and has onset of dizziness towards the end    PATIENT EDUCATION: Education details: continue to monitor BP and dizziness at home Person educated: Patient and Spouse Education method: Explanation Education comprehension: verbalized understanding and needs further education  HOME EXERCISE PROGRAM: Access Code: [email or web address] URL:  [email or web address] Date: 02/09/2023 Prepared by: Sherlie Ban  Program Notes Sitting at edge of mat table, with left leg tucked behind right, holding onto chair, gently lean forward and lift bottom off mat and hold for a few seconds and then slowly lower to the  mat.   Exercises - Sit to Stand  - 1 x daily - 7 x weekly - 3 sets - 5 reps - Seated Long Arc Quad  - 1 x daily - 7 x weekly - 3 sets - 5 reps - 5 sec hold - Supine Bridge  - 1 x daily - 7 x weekly - 1-2 sets - 10 reps - Supine Hip Adduction Isometric with Ball  - 1 x daily - 7 x weekly - 1-2 sets - 10 reps  GOALS: Goals reviewed with patient? Yes  SHORT TERM GOALS: Target date: 02/23/2023  Pt will be independent with initial HEP with family supervision in order to build upon functional gains made in therapy. Baseline: Goal status: INITIAL  2.  Pt and pt's family will verbalize understanding on fall prevention in the home.  Baseline:  Goal status: INITIAL  3.  Pt will improve Berg score to 6/56 for decreased fall risk Baseline: 2/56 (7/2) Goal status: INITIAL  4. Pt will improve 5x sit<>stand to less than or equal to 20 sec with no UE support to demonstrate improved functional strength and transfer efficiency.  Baseline: 24.5 seconds with no UE support Goal status: INITIAL  5.  Pt will improve gait speed with RW to at least 1.2 ft/sec in order to demo decr fall risk.  Baseline: 44.22 seconds with RW = .74 ft/sec, pt needing one brief standing rest break due to LLE weakness and felt like LLE could buckle Goal status: INITIAL  6.  Pt will be able to stand for 5 minutes with supervision in order to improve standing tolerance for ADLs.  Baseline: Pt's spouse states pt can not stand for too long due to fatigue/weakness  Goal status: INITIAL  LONG TERM GOALS: Target date: 03/23/2023  Pt will be independent with final HEP with family supervision in order to build upon functional gains made in  therapy. Baseline:  Goal status: INITIAL  2.  Pt will improve Berg score to 10/56 for decreased fall risk Baseline: 2/56 (7/2) Goal status: INITIAL  3.  Pt will improve gait speed with RW vs. LRAD to at least 1.8 ft/sec in order to demo improved household mobility/decr fall risk  Baseline: 44.22 seconds with RW = .74 ft/sec, pt needing one brief standing rest break due to LLE weakness and felt like LLE could buckle Goal status: INITIAL  4.  Pt will ambulate at least 400' with LRAD and supervision (over indoor/outdoor paved surfaces) in order to demo improved household/community mobility.  Baseline:  24.5 seconds with no UE support Goal status: INITIAL  5.  Pt will improve 5x sit<>stand to less than or equal to 16 sec with no UE support to demonstrate improved functional strength and transfer efficiency.  Baseline:  Goal status: INITIAL    ASSESSMENT:  CLINICAL IMPRESSION: Emphasis of skilled PT session on continuing to monitor vitals and dizziness response to activity, working on global endurance training, and working on standing tolerance, SLS, and LE coordination. Pt with improved tolerance for standing activity this date and for short distance gait. Pt does have onset of dizziness after standing x 1 min but it resolves with seated rest break and vitals remain WNL. Pt continues to benefit from skilled therapy services to work towards improving his balance, safety awareness, and improve his L hemibody strength and coordination in order to increase his safety and independence with functional mobility. Continue POC.    OBJECTIVE IMPAIRMENTS: Abnormal gait, decreased activity tolerance, decreased balance, decreased coordination,  decreased endurance, decreased knowledge of use of DME, decreased mobility, difficulty walking, decreased ROM, decreased strength, and postural dysfunction.   ACTIVITY LIMITATIONS: carrying, lifting, bending, standing, stairs, transfers, and locomotion  level  PARTICIPATION LIMITATIONS: cleaning, driving, shopping, community activity, and yard work  PERSONAL FACTORS: Age, Behavior pattern, Past/current experiences, Time since onset of injury/illness/exacerbation, and 3+ comorbidities: CAD, HTN, TIA, anxiety, BPPV, HLD, gout, prostate CA, CABG and stents  are also affecting patient's functional outcome.   REHAB POTENTIAL: Good  CLINICAL DECISION MAKING: Evolving/moderate complexity  EVALUATION COMPLEXITY: Moderate  PLAN:  PT FREQUENCY: 2x/week  PT DURATION: 8 weeks  PLANNED INTERVENTIONS: Therapeutic exercises, Therapeutic activity, Neuromuscular re-education, Balance training, Gait training, Patient/Family education, Self Care, Joint mobilization, Stair training, Orthotic/Fit training, DME instructions, Aquatic Therapy, Manual therapy, and Re-evaluation  PLAN FOR NEXT SESSION: CHECK BP!!! Add to HEP as appropriate. Pt with LLE buckling and L lateral lean in unsupported sitting and standing. Gait with RW. Work on sitting balance/maintaining midline and adding safer exercises to HEP. SciFit, Blaze pods, SLS, LLE coordination   Peter Congo, PT, DPT, CSRS  02/12/23 2:16 PM

## 2023-02-15 ENCOUNTER — Encounter: Payer: Self-pay | Admitting: Cardiology

## 2023-02-15 NOTE — Progress Notes (Unsigned)
Cardiology Office Note:    Date:  02/16/2023   ID:  Tyler Finer., DOB Feb 17, 1951, MRN [id number]  PCP:  Joaquim Nam, MD  Cardiologist:  Lesleigh Noe, MD (Inactive)   Referring MD: Joaquim Nam, MD   Chief Complaint  Patient presents with   Coronary Artery Disease   Hypertension   Hyperlipidemia    History of Present Illness:    Tyler Lupinacci. is a 72 y.o. male with a hx of CABG 2000 (LIMA--> LAD; SVG RCA, and Free radial to OM) and then NSTEMI on 05/2021, hypertension, DM II, Hyperlipidemia, hypertension, h/o TIA/CVA.  Initially had CABG in 2000 with LIMA to the LAD, SVG to the RCA and free radial to OM.  He then had an NSTEMI in October 2022.  Cath at that time showed mild diffuse luminal irregularities in the SVG to the PDA and occluded proximal RCA, free radial to OM was patent with occluded ostial left circumflex and SVG to diagonal was occluded at its ostium with 80% proximal diagonal and he underwent PCI of the diagonal.  He has a history of carotid artery stenosis with last carotid Dopplers done 01/2017 showing 1 to 39% bilateral carotid artery stenosis.  He has a history of diabetes mellitus, hypertension, hyperlipidemia and GERD as well.  He has known PVCs in the past as well and history of TIA.  He is here today for followup and is doing well.  He denies any chest pain or pressure, SOB, DOE, PND, orthopnea, LE edema, palpitations or syncope. He has had some problems with orthostatic hypotension in the past which improved after he decreased the Losartan to 25-12.5mg  daily but still runs a low BP and has some dizziness when he stands up.  He is compliant with his meds and is tolerating meds with no SE.     Past Medical History:  Diagnosis Date   Anxiety state 03/16/2016   Aphasia 01/26/2017   Benign localized prostatic hyperplasia with lower urinary tract symptoms (LUTS)    BPV (benign positional vertigo) 11/02/2014   Carotid artery stenosis    PER  DUPLEX 10-22-2015  BILATERAL ICA 1-39%   Cataract    Coronary artery disease    CABG 2000 with LIMA> LAD, SVG > RCA and free radial > OM. 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   Gout    Gout 07/18/2010   Qualifier: Diagnosis of  By: Para March MD, Cheree Ditto     H/O eye injury    chronic changes to left eye after injury   Hand weakness 03/02/2018   Hearing loss 04/21/2013   History of TIA (transient ischemic attack)    03/ 2017  no residual after brief episode loss peripheral vision   HOH (hard of hearing)    Hyperglycemia 05/17/2016   Hyperlipemia    Hyperlipidemia 07/18/2010   Qualifier: Diagnosis of  By: Para March MD, Graham     Hypertension    HYPERTENSION, BENIGN ESSENTIAL 07/18/2010   Qualifier: Diagnosis of  By: Para March MD, Graham     Hypokalemia 01/26/2017   Knee pain 03/13/2019   Left shoulder pain 09/04/2018   Pneumothorax 10/11/2019   RIGHT  Prostate cancer (HCC) UROLOGIST-  DR GRAPEY/  ONCOLOGIST-  DR MANNING   dx 2015--- Stage T1c, Gleason 3+4, PSA 4.03, vol 44cc   PVC's (premature ventricular contractions)    Rash and nonspecific skin eruption 10/14/2015   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   S/P CABG x 05 Dec 1998   Skin lesion 10/14/2015   Stroke [facility])    TIA (transient ischemic attack) 10/14/2015   Ventral hernia 10/26/2011   Wears glasses     Past Surgical History:  Procedure Laterality Date   APPENDECTOMY  1975   CARDIAC CATHETERIZATION  12-22-2001   dr Verdis Prime   severe native vessel disease mLAD 60-70%,  total occulsion pCFX  and pRCA/  widely patent saphenous vein , free radial , and LIMA grafts/  minminal lv dysfunction, ef 60%    CHEST TUBE INSERTION Right 10/11/2019   CORONARY ARTERY BYPASS GRAFT  12/1998   LIMA to LAD,  SVG to PDA and Diagonal, Free radial graft to OM   CORONARY STENT INTERVENTION N/A 05/26/2021   Procedure: CORONARY STENT INTERVENTION;  Surgeon: Orbie Pyo, MD;  Location: [facility];  Service: Cardiovascular;  Laterality: N/A;   Exericse treadmill test  last one 01-03-2014  dr Verdis Prime   normal exercise tolerance w/ hypertensive repsonse,  no ischemic EKG changes, appropriate HR response & recovery ([facility] TM score 9;  Low Risk , PVC's w/ exertion)   EYE SURGERY     FRACTURE SURGERY     LEFT HEART CATH AND CORS/GRAFTS ANGIOGRAPHY N/A 05/26/2021   Procedure: LEFT HEART CATH AND CORS/GRAFTS ANGIOGRAPHY;  Surgeon: Orbie Pyo, MD;  Location: [facility];  Service: Cardiovascular;  Laterality: N/A;   LOOP RECORDER INSERTION N/A 12/22/2022   Procedure: LOOP RECORDER INSERTION;  Surgeon: Marinus Maw, MD;  Location: [facility];  Service: Cardiovascular;  Laterality: N/A;   PROSTATE BIOPSY     RADIOACTIVE SEED IMPLANT N/A 01/13/2016   Procedure: RADIOACTIVE SEED IMPLANT/BRACHYTHERAPY IMPLANT;  Surgeon: Barron Alvine, MD;  Location: [facility];  Service: Urology;  Laterality: N/A;    Current Medications: Current Meds  Medication Sig   allopurinol (ZYLOPRIM) 300 MG tablet TAKE 1 TABLET DAILY   aspirin EC 81 MG tablet Take 1 tablet (81 mg total) by mouth daily.   atorvastatin (LIPITOR) 80 MG tablet TAKE 1 TABLET DAILY   Cholecalciferol (VITAMIN D PO) Take 1 capsule by mouth daily.   clopidogrel (PLAVIX) 75 MG tablet Take 75 mg by mouth daily. Starting 01/22/23   escitalopram (LEXAPRO) 5 MG tablet TAKE 1 TABLET DAILY   losartan-hydrochlorothiazide (HYZAAR) 50-12.5 MG tablet Take 0.5 tablets by mouth daily.   metoprolol tartrate (LOPRESSOR) 25 MG tablet Take 0.5 tablets (12.5 mg total) by mouth 2 (two) times daily.   Misc Natural Products (BLACK CHERRY  CONCENTRATE PO) Take 1 tablet by mouth at bedtime.   Multiple Vitamins-Minerals (OCUVITE PO) Take 1 capsule by mouth daily.   nitroGLYCERIN (NITROSTAT) 0.4 MG SL tablet Place 1 tablet (0.4 mg total) under the tongue every 5 (five) minutes as needed for chest pain.   pantoprazole (PROTONIX) 40 MG tablet Take 1 tablet (40 mg total) by mouth daily.   polyethylene glycol powder (GLYCOLAX/MIRALAX) 17 GM/SCOOP powder Take 17 g by mouth daily as needed for mild constipation.   Propylene Glycol (SYSTANE BALANCE) 0.6 % SOLN Place 1 drop into both eyes daily as needed (dry eyes).   sodium  chloride (OCEAN) 0.65 % SOLN nasal spray Place 1 spray into both nostrils as needed for congestion.   tamsulosin (FLOMAX) 0.4 MG CAPS capsule TAKE 1 CAPSULE TWICE A DAY   Vitamin A 3 MG CAPS Take by mouth.     Allergies:   Patient has no known allergies.   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      Right Handed    Lives in a two story home    Social Determinants of Health   Financial Resource Strain: Low Risk  (01/15/2023)   Overall Financial Resource Strain (CARDIA)    Difficulty of Paying Living Expenses: Not very hard  Food Insecurity: No Food Insecurity (01/15/2023)   Hunger Vital Sign    Worried About Running Out of Food in the Last Year: Never true    Ran Out of Food in the Last Year: Never true   Transportation Needs: No Transportation Needs (01/15/2023)   PRAPARE - Administrator, Civil Service (Medical): No    Lack of Transportation (Non-Medical): No  Physical Activity: Unknown (01/15/2023)   Exercise Vital Sign    Days of Exercise per Week: 0 days    Minutes of Exercise per Session: Not on file  Stress: No Stress Concern Present (01/15/2023)   [employer] of Occupational Health - Occupational Stress Questionnaire    Feeling of Stress : Only a little  Social Connections: Moderately Isolated (01/15/2023)   Social Connection and Isolation Panel [NHANES]    Frequency of Communication with Friends and Family: More than three times a week    Frequency of Social Gatherings with Friends and Family: More than three times a week    Attends Religious Services: Never    Database administrator or Organizations: No    Attends Engineer, structural: Not on file    Marital Status: Married     Family History: The patient's family history includes Anxiety disorder in his sister; Cancer in his father, paternal uncle, and sister; Dementia in his mother; Heart disease in his father and mother; Prostate cancer in his father; Stroke in his father. There is no history of Colon cancer.  ROS:   Please see the history of present illness.    No claudication.  Compliant with medication.  No dyspnea.  All other systems reviewed and are negative.  EKGs/Labs/Other Studies Reviewed:    The following studies were reviewed today: CARDIAC CATH 2022: Diagnostic Dominance: Right Intervention    EKG:  EKG not performed  Recent Labs: 11/26/2022: TSH 1.32 12/22/2022: Magnesium 2.0 01/18/2023: ALT 12; BUN 24; Creatinine, Ser 1.05; Hemoglobin 13.2; Platelets 204.0; Potassium 3.4; Sodium 140  Recent Lipid Panel    Component Value Date/Time   CHOL 128 12/22/2022 0121  CHOL 126 10/01/2021 0900   TRIG 99 12/22/2022 0121   HDL 42 12/22/2022 0121   HDL 44 10/01/2021 0900   CHOLHDL  3.0 12/22/2022 0121   VLDL 20 12/22/2022 0121   LDLCALC 66 12/22/2022 0121   LDLCALC 64 10/01/2021 0900   LDLDIRECT 97.0 10/01/2020 0916    Physical Exam:    VS:  BP 102/68   Pulse 77   Ht 5\' 10"  (1.778 m)   Wt 213 lb (96.6 kg)   SpO2 96%   BMI 30.56 kg/m     Wt Readings from Last 3 Encounters:  02/16/23 213 lb (96.6 kg)  01/20/23 209 lb (94.8 kg)  01/18/23 209 lb (94.8 kg)     GEN: Well nourished, well developed in no acute distress HEENT: Normal NECK: No JVD; No carotid bruits LYMPHATICS: No lymphadenopathy CARDIAC:RRR, no murmurs, rubs, gallops RESPIRATORY:  Clear to auscultation without rales, wheezing or rhonchi  ABDOMEN: Soft, non-tender, non-distended MUSCULOSKELETAL:  No edema; No deformity  SKIN: Warm and dry NEUROLOGIC:  Alert and oriented x 3 PSYCHIATRIC:  Normal affect  ASSESSMENT:    1. Coronary artery disease involving coronary bypass graft of native heart without angina pectoris   2. Essential hypertension   3. Hyperlipidemia, unspecified hyperlipidemia type   4. TIA (transient ischemic attack)     PLAN:    In order of problems listed above:  ASCAD -s/p CABG 2000 with LIMA > LAD, SVG > RCA and free radial > OM.   -s/p NSTEMI in October 2022.  Cath showed mild diffuse luminal irregularities in the SVG > PDA and occluded proximal RCA, free radial > OM patent, occluded ostial left circumflex and occluded SVG > diagonal,  80% proximal diagonal s/p PCI of diagonal -He has not had any anginal symptoms since he saw Dr. Katrinka Blazing last -Continue prescription drug management with aspirin 81 mg daily, Plavix 75 mg daily, atorvastatin 80 mg daily, Lopressor 12.5 mg twice daily with as needed refills  HTN -BP is controlled on exam today -Continue Lopressor 12.5 mg twice daily with as needed refills -stop hydrochlorothiazide portion of his Hyzaar and just put him on Losartan 25mg  daily given his soft BP and tendency towards orthostatic hypotension -I have  personally reviewed and interpreted outside labs performed by patient's PCP which showed SCr 1.05 and K+ 3.4  HLD -LDL goal <70 -Continue prescription drug management with atorvastatin 80 mg daily with as needed refills -I have personally reviewed and interpreted outside labs performed by patient's PCP which showed LDL 66 and HDL 42 on 12/22/2022  Carotid artery stenosis -Carotid Dopplers 2018 with 1 to 39% bilateral carotid artery stenosis -CT angio of the neck with no carotid disease 12/2022 -He is on aspirin and Plavix for both history of PCI and TIA/CVA  Aortic stenosis -mild by echo 12/2022 -repeat echo 12/2023  TIA/CVA -2D echo with normal LVF and mild AS -s/p ILR with no arrhythmias for June>>followed by EP  Medication Adjustments/L.abs and Tests Ordered: Current medicines are reviewed at length with the patient today.  Concerns regarding medicines are outlined above.  No orders of the defined types were placed in this encounter.  No orders of the defined types were placed in this encounter.   There are no Patient Instructions on file for this visit.   Signed, Armanda Magic, MD  02/16/2023 9:38 AM    [facility]

## 2023-02-16 ENCOUNTER — Ambulatory Visit: Payer: Medicare Other

## 2023-02-16 ENCOUNTER — Encounter: Payer: Self-pay | Admitting: Cardiology

## 2023-02-16 ENCOUNTER — Ambulatory Visit: Payer: Medicare Other | Attending: Cardiology | Admitting: Cardiology

## 2023-02-16 ENCOUNTER — Ambulatory Visit: Payer: Medicare Other | Admitting: Physical Therapy

## 2023-02-16 VITALS — BP 123/76 | HR 78

## 2023-02-16 VITALS — BP 102/68 | HR 77 | Ht 70.0 in | Wt 213.0 lb

## 2023-02-16 VITALS — Ht 69.0 in | Wt 214.0 lb

## 2023-02-16 DIAGNOSIS — Z9181 History of falling: Secondary | ICD-10-CM | POA: Diagnosis not present

## 2023-02-16 DIAGNOSIS — R2689 Other abnormalities of gait and mobility: Secondary | ICD-10-CM | POA: Diagnosis not present

## 2023-02-16 DIAGNOSIS — M6281 Muscle weakness (generalized): Secondary | ICD-10-CM | POA: Diagnosis not present

## 2023-02-16 DIAGNOSIS — E785 Hyperlipidemia, unspecified: Secondary | ICD-10-CM | POA: Diagnosis not present

## 2023-02-16 DIAGNOSIS — G459 Transient cerebral ischemic attack, unspecified: Secondary | ICD-10-CM | POA: Diagnosis present

## 2023-02-16 DIAGNOSIS — I69354 Hemiplegia and hemiparesis following cerebral infarction affecting left non-dominant side: Secondary | ICD-10-CM | POA: Diagnosis not present

## 2023-02-16 DIAGNOSIS — R2681 Unsteadiness on feet: Secondary | ICD-10-CM | POA: Diagnosis not present

## 2023-02-16 DIAGNOSIS — I10 Essential (primary) hypertension: Secondary | ICD-10-CM | POA: Diagnosis not present

## 2023-02-16 DIAGNOSIS — I25810 Atherosclerosis of coronary artery bypass graft(s) without angina pectoris: Secondary | ICD-10-CM | POA: Diagnosis present

## 2023-02-16 DIAGNOSIS — Z0000 Encounter for general adult medical examination without abnormal findings: Secondary | ICD-10-CM | POA: Diagnosis not present

## 2023-02-16 DIAGNOSIS — I639 Cerebral infarction, unspecified: Secondary | ICD-10-CM | POA: Diagnosis not present

## 2023-02-16 MED ORDER — LOSARTAN POTASSIUM 25 MG PO TABS: 25.0000 mg | ORAL_TABLET | Freq: Every day | ORAL | 3 refills | Status: DC

## 2023-02-16 NOTE — Progress Notes (Signed)
Subjective:   Tyler Guerra. is a 72 y.o. male who presents for Medicare Annual/Subsequent preventive examination.  Visit Complete: Virtual  I connected with  [name]. on 02/16/23 by a audio enabled telemedicine application and verified that I am speaking with the correct person using two identifiers.  Patient Location: Home  Provider Location: Home Office  I discussed the limitations of evaluation and management by telemedicine. The patient expressed understanding and agreed to proceed.   Review of Systems      Cardiac Risk Factors include: advanced age (>42men, >34 women);hypertension;dyslipidemia;diabetes mellitus;male gender;sedentary lifestyle     Objective:    Today's Vitals   02/16/23 1332  Weight: 214 lb (97.1 kg)  Height: 5\' 9"  (1.753 m)   Body mass index is 31.6 kg/m.     02/16/2023    1:46 PM 01/26/2023    2:01 PM 01/19/2023   11:07 AM 12/22/2022    3:12 PM 12/17/2022    2:27 PM 12/19/2021    3:32 PM 05/27/2021   10:32 AM  Advanced Directives  Does Patient Have a Medical Advance Directive? No No No No No No   Would patient like information on creating a medical advance directive? No - Patient declined   No - Patient declined   No - Patient declined    Current Medications (verified) Outpatient Encounter Medications as of 02/16/2023  Medication Sig   allopurinol (ZYLOPRIM) 300 MG tablet TAKE 1 TABLET DAILY   aspirin EC 81 MG tablet Take 1 tablet (81 mg total) by mouth daily.   atorvastatin (LIPITOR) 80 MG tablet TAKE 1 TABLET DAILY   Cholecalciferol (VITAMIN D PO) Take 1 capsule by mouth daily.   clopidogrel (PLAVIX) 75 MG tablet Take 75 mg by mouth daily. Starting 01/22/23   escitalopram (LEXAPRO) 5 MG tablet TAKE 1 TABLET DAILY   losartan (COZAAR) 25 MG tablet Take 1 tablet (25 mg total) by mouth daily.   metoprolol tartrate (LOPRESSOR) 25 MG tablet Take 0.5 tablets (12.5 mg total) by mouth 2 (two) times daily.   Misc Natural Products (BLACK  CHERRY CONCENTRATE PO) Take 1 tablet by mouth at bedtime.   Multiple Vitamins-Minerals (OCUVITE PO) Take 1 capsule by mouth daily.   nitroGLYCERIN (NITROSTAT) 0.4 MG SL tablet Place 1 tablet (0.4 mg total) under the tongue every 5 (five) minutes as needed for chest pain.   pantoprazole (PROTONIX) 40 MG tablet Take 1 tablet (40 mg total) by mouth daily.   polyethylene glycol powder (GLYCOLAX/MIRALAX) 17 GM/SCOOP powder Take 17 g by mouth daily as needed for mild constipation.   Propylene Glycol (SYSTANE BALANCE) 0.6 % SOLN Place 1 drop into both eyes daily as needed (dry eyes).   sodium chloride (OCEAN) 0.65 % SOLN nasal spray Place 1 spray into both nostrils as needed for congestion.   tamsulosin (FLOMAX) 0.4 MG CAPS capsule TAKE 1 CAPSULE TWICE A DAY   Vitamin A 3 MG CAPS Take by mouth.   ticagrelor (BRILINTA) 90 MG TABS tablet Take 1 tablet (90 mg total) by mouth 2 (two) times daily. (Patient not taking: Reported on 02/16/2023)   [DISCONTINUED] losartan-hydrochlorothiazide (HYZAAR) 50-12.5 MG tablet Take 0.5 tablets by mouth daily.   No facility-administered encounter medications on file as of 02/16/2023.    Allergies (verified) Patient has no known allergies.   History: Past Medical History:  Diagnosis Date   Anxiety state 03/16/2016   Aphasia 01/26/2017   Benign localized prostatic hyperplasia with lower urinary tract symptoms (LUTS)  BPV (benign positional vertigo) 11/02/2014   Carotid artery stenosis    PER DUPLEX 10-22-2015  BILATERAL ICA 1-39%   Cataract    Coronary artery disease    CABG 2000 with LIMA> LAD, SVG > RCA and free radial > OM. S/P NSTEMI 05/2021 cath with luiminal Irrg SVG>RCA, occluded prox RCA, patent free radial>OM, occluded oLCx, occluded o1   Depression 01/26/2017   Diabetes mellitus without complication (HCC) 03/02/2018   GERD 07/19/2010   Qualifier: Diagnosis of  By: Para March MD, Cheree Ditto     GERD (gastroesophageal reflux disease)    Golfer's elbow  03/13/2019   Gout    Gout 07/18/2010   Qualifier: Diagnosis of  By: Para March MD, Cheree Ditto     H/O eye injury    chronic changes to left eye after injury   Hand weakness 03/02/2018   Hearing loss 04/21/2013   History of TIA (transient ischemic attack)    03/ 2017  no residual after brief episode loss peripheral vision   HOH (hard of hearing)    Hyperglycemia 05/17/2016   Hyperlipemia    Hyperlipidemia 07/18/2010   Qualifier: Diagnosis of  By: Para March MD, Graham     Hypertension    HYPERTENSION, BENIGN ESSENTIAL 07/18/2010   Qualifier: Diagnosis of  By: Para March MD, Graham     Hypokalemia 01/26/2017   Knee pain 03/13/2019   Left shoulder pain 09/04/2018   Pneumothorax 10/11/2019   RIGHT    Prostate cancer (HCC) UROLOGIST-  DR GRAPEY/  ONCOLOGIST-  DR MANNING   dx 2015--- Stage T1c, Gleason 3+4, PSA 4.03, vol 44cc   PVC's (premature ventricular contractions)    Rash and nonspecific skin eruption 10/14/2015   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   S/P CABG x 05 Dec 1998   Skin lesion 10/14/2015   Stroke [facility])    TIA (transient ischemic attack) 10/14/2015   Ventral hernia 10/26/2011   Wears glasses    Past Surgical History:  Procedure Laterality Date   APPENDECTOMY  1975   CARDIAC CATHETERIZATION  12-22-2001   dr Verdis Prime   severe native vessel disease mLAD 60-70%,  total occulsion pCFX  and pRCA/  widely patent saphenous vein , free radial , and LIMA grafts/  minminal lv dysfunction, ef 60%   CHEST TUBE INSERTION Right 10/11/2019   CORONARY ARTERY BYPASS GRAFT  12/1998   LIMA to LAD,  SVG to PDA and Diagonal, Free radial graft to OM   CORONARY STENT INTERVENTION N/A 05/26/2021   Procedure: CORONARY STENT INTERVENTION;  Surgeon: Orbie Pyo, MD;  Location: [facility] INVASIVE [facility];  Service: Cardiovascular;  Laterality: N/A;   Exericse treadmill test  last one 01-03-2014  dr Verdis Prime   normal exercise tolerance w/ hypertensive repsonse,  no ischemic EKG  changes, appropriate HR response & recovery ([facility] TM score 9;  Low Risk , PVC's w/ exertion)   EYE SURGERY     FRACTURE SURGERY     LEFT HEART CATH AND CORS/GRAFTS ANGIOGRAPHY N/A 05/26/2021   Procedure: LEFT HEART CATH AND CORS/GRAFTS ANGIOGRAPHY;  Surgeon: Orbie Pyo, MD;  Location: [facility] CV [facility];  Service: Cardiovascular;  Laterality: N/A;   LOOP RECORDER INSERTION N/A 12/22/2022   Procedure: LOOP RECORDER INSERTION;  Surgeon: Marinus Maw, MD;  Location: [facility];  Service: Cardiovascular;  Laterality: N/A;   PROSTATE BIOPSY     RADIOACTIVE SEED IMPLANT N/A 01/13/2016   Procedure: RADIOACTIVE SEED IMPLANT/BRACHYTHERAPY IMPLANT;  Surgeon:  Barron Alvine, MD;  Location: [facility];  Service: Urology;  Laterality: N/A;   Family History  Problem Relation Age of Onset   Cancer Father        prostate in eighties   Prostate cancer Father    Heart disease Father    Stroke Father    Dementia Mother    Heart disease Mother    Cancer Sister        breast   Cancer Paternal Uncle        prostate   Anxiety disorder Sister    Colon cancer Neg Hx    Social History   Socioeconomic History   Marital status: Married    Spouse name: Not on file   Number of children: 2   Years of education: Not on file   Highest education level: 12th grade  Occupational History   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      Right Handed    Lives in a two story home    Social Determinants of Health   Financial Resource Strain: Low Risk  (02/16/2023)   Overall Financial  Resource Strain (CARDIA)    Difficulty of Paying Living Expenses: Not hard at all  Food Insecurity: No Food Insecurity (02/16/2023)   Hunger Vital Sign    Worried About Running Out of Food in the Last Year: Never true    Ran Out of Food in the Last Year: Never true  Transportation Needs: No Transportation Needs (02/16/2023)   PRAPARE - Administrator, Civil Service (Medical): No    Lack of Transportation (Non-Medical): No  Physical Activity: Insufficiently Active (02/16/2023)   Exercise Vital Sign    Days of Exercise per Week: 7 days    Minutes of Exercise per Session: 20 min  Stress: No Stress Concern Present (02/16/2023)   [employer] of Occupational Health - Occupational Stress Questionnaire    Feeling of Stress : Not at all  Social Connections: Moderately Isolated (02/16/2023)   Social Connection and Isolation Panel [NHANES]    Frequency of Communication with Friends and Family: More than three times a week    Frequency of Social Gatherings with Friends and Family: More than three times a week    Attends Religious Services: Never    Database administrator or Organizations: No    Attends Engineer, structural: Never    Marital Status: Married    Tobacco Counseling Counseling given: Not Answered   Clinical Intake:  Pre-visit preparation completed: Yes  Pain : No/denies pain     BMI - recorded: 31.6 Nutritional Status: BMI > 30  Obese Nutritional Risks: None Diabetes: No (not diabetic per pt) CBG done?: No Did pt. bring in CBG monitor from home?: No  How often do you need to have someone help you when you read instructions, pamphlets, or other written materials from your doctor or pharmacy?: 1 - Never  Interpreter Needed?: No  Information entered by :: C.Valgene Deloatch LPN  Activities of Daily Living    02/16/2023    1:46 PM 02/16/2023    1:05 PM  In your present state of health, do you have any difficulty performing the following activities:   Hearing? 1 1  Comment wears aids   Vision? 0 0  Difficulty concentrating or making decisions? 0 0  Walking or climbing stairs? 1 1  Comment s/p stroke   Dressing or bathing? 1 0  Comment wife assists   Doing errands, shopping? 1 1  Comment wife Biomedical scientist and eating ? N N  Using the Toilet? N N  In the past six months, have you accidently leaked urine? Y N  Comment on medication   Do you have problems with loss of bowel control? N N  Managing your Medications? Y   Comment wife assists   Managing your Finances? N N  Housekeeping or managing your Housekeeping? Daryel Gerald  Comment wife assists     Patient Care Team: Joaquim Nam, MD as PCP - General Quintella Reichert, MD as PCP - Cardiology (Cardiology) Ellis Parents, OD (Orthopedic Surgery) Kathyrn Sheriff, [facility] (Inactive) as Pharmacist (Pharmacist) Glendale Chard, DO as Consulting Physician (Neurology)  Indicate any recent Medical Services you may have received from other than [facility] providers in the past year (date may be approximate).     Assessment:   This is a routine wellness examination for [place].  Hearing/Vision screen Hearing Screening - Comments:: Wears hearing aids Vision Screening - Comments:: Glasses - UTD on eye exams - [facility]  Dietary issues and exercise activities discussed:     Goals Addressed             This Visit's Progress    Patient Stated       Lose weight, decrease sugar intake.       Depression Screen    02/16/2023    1:44 PM 11/26/2022   11:35 AM 12/19/2021    3:33 PM 03/01/2018   12:56 PM 08/19/2015    4:03 PM 08/19/2015    3:37 PM  PHQ 2/9 Scores  PHQ - 2 Score 0 0 0 0 0 0  PHQ- 9 Score  2  0      Fall Risk    02/16/2023    1:39 PM 02/16/2023    1:05 PM 01/19/2023   11:06 AM 11/26/2022   11:35 AM 12/19/2021    3:33 PM  Fall Risk   Falls in the past year? 1 1 1  0 0  Number falls in past yr: 1 1 1  0 0  Injury with Fall? 0 0 0 0 0  Comment Bruised       Risk for fall due to : Impaired balance/gait;Impaired mobility   No Fall Risks Medication side effect  Risk for fall due to: Comment impaired mobility due to stroke      Follow up Falls evaluation completed;Education provided;Falls prevention discussed  Falls evaluation completed Falls evaluation completed Falls evaluation completed;Education provided;Falls prevention discussed    MEDICARE RISK AT HOME:   TIMED UP AND GO:  Was the test performed?  No    Cognitive Function:    03/01/2018   12:59 PM  MMSE - Mini Mental State Exam  Orientation to time 5  Orientation to Place 5  Registration 3  Attention/ Calculation 0  Recall 3  Language- name 2 objects 0  Language- repeat 1  Language- follow 3 step command 3  Language- read &  follow direction 0  Write a sentence 0  Copy design 0  Total score 20        02/16/2023    1:49 PM 12/19/2021    3:36 PM  6CIT Screen  What Year? 0 points 0 points  What month? 0 points 0 points  What time? 0 points 0 points  Count back from 20 0 points 0 points  Months in reverse 2 points 0 points  Repeat phrase 4 points 4 points  Total Score 6 points 4 points    Immunizations Immunization History  Administered Date(s) Administered   Fluad Quad(high Dose 65+) 04/25/2019, 05/18/2020, 04/08/2021   Influenza Split 04/28/2012   Influenza Whole 05/03/2010   Influenza, High Dose Seasonal PF 05/14/2022   Influenza,inj,Quad PF,6+ Mos 04/20/2013, 05/11/2014, 05/31/2015, 05/11/2016, 08/11/2018   PFIZER(Purple Top)SARS-COV-2 Vaccination 09/25/2019, 10/18/2019, 07/23/2020   Pneumococcal Conjugate-13 05/31/2017, 03/01/2018   Pneumococcal Polysaccharide-23 08/04/2007, 04/20/2013   Respiratory Syncytial Virus Vaccine,Recomb Aduvanted(Arexvy) 05/14/2022   Td 08/03/2005   Tdap 05/15/2016   Zoster, Live 10/06/2015    TDAP status: Up to date  Flu Vaccine status: Up to date  Pneumococcal vaccine status: Due, Education has been provided regarding the  importance of this vaccine. Advised may receive this vaccine at local pharmacy or Health Dept. Aware to provide a copy of the vaccination record if obtained from local pharmacy or Health Dept. Verbalized acceptance and understanding.  Covid-19 vaccine status: Information provided on how to obtain vaccines.   Qualifies for Shingles Vaccine? Yes   Zostavax completed No   Shingrix Completed?: No.    Education has been provided regarding the importance of this vaccine. Patient has been advised to call insurance company to determine out of pocket expense if they have not yet received this vaccine. Advised may also receive vaccine at local pharmacy or Health Dept. Verbalized acceptance and understanding.  Screening Tests Health Maintenance  Topic Date Due   Zoster Vaccines- Shingrix (1 of 2) 12/24/1970   Medicare Annual Wellness (AWV)  12/20/2022   Pneumonia Vaccine 88+ Years old (3 of 3 - PPSV23 or PCV20) 03/02/2023   INFLUENZA VACCINE  03/04/2023   HEMOGLOBIN A1C  06/24/2023   FOOT EXAM  11/26/2023   Diabetic kidney evaluation - Urine ACR  12/02/2023   Diabetic kidney evaluation - eGFR measurement  01/18/2024   OPHTHALMOLOGY EXAM  01/22/2024   DTaP/Tdap/Td (3 - Td or Tdap) 05/15/2026   Colonoscopy  01/20/2027   Hepatitis C Screening  Completed   HPV VACCINES  Aged Out   COVID-19 Vaccine  Discontinued    Health Maintenance  Health Maintenance Due  Topic Date Due   Zoster Vaccines- Shingrix (1 of 2) 12/24/1970   Medicare Annual Wellness (AWV)  12/20/2022   Pneumonia Vaccine 1+ Years old (3 of 3 - PPSV23 or PCV20) 03/02/2023    Colorectal cancer screening: Type of screening: Colonoscopy. Completed 01/19/17. Repeat every 10 years  Lung Cancer Screening: (Low Dose CT Chest recommended if Age 12-80 years, 20 pack-year currently smoking OR have quit w/in 15years.) does not qualify.   Lung Cancer Screening Referral: n/a  Additional Screening:  Hepatitis C Screening: does qualify;  Completed 05/15/16  Vision Screening: Recommended annual ophthalmology exams for early detection of glaucoma and other disorders of the eye. Is the patient up to date with their annual eye exam?  Yes  Who is the provider or what is the name of the office in which the patient attends annual eye exams? [facility] If pt is  not established with a provider, would they like to be referred to a provider to establish care? Yes .   Dental Screening: Recommended annual dental exams for proper oral hygiene    Community Resource Referral / Chronic Care Management: CRR required this visit?  No   CCM required this visit?  No     Plan:     I have personally reviewed and noted the following in the patient's chart:   Medical and social history Use of alcohol, tobacco or illicit drugs  Current medications and supplements including opioid prescriptions. Patient is not currently taking opioid prescriptions. Functional ability and status Nutritional status Physical activity Advanced directives List of other physicians Hospitalizations, surgeries, and ER visits in previous 12 months Vitals Screenings to include cognitive, depression, and falls Referrals and appointments  In addition, I have reviewed and discussed with patient certain preventive protocols, quality metrics, and best practice recommendations. A written personalized care plan for preventive services as well as general preventive health recommendations were provided to patient.     Maryan Puls, LPN   03/31/5620   After Visit Summary: (MyChart) Due to this being a telephonic visit, the after visit summary with patients personalized plan was offered to patient via MyChart   Nurse Notes: none

## 2023-02-16 NOTE — Patient Instructions (Signed)
Tyler Guerra , Thank you for taking time to come for your Medicare Wellness Visit. I appreciate your ongoing commitment to your health goals. Please review the following plan we discussed and let me know if I can assist you in the future.   These are the goals we discussed:  Goals      Increase physical activity     Starting 03/01/2018, I will continue to walk at least 1 mile 3 days per week.      Patient Stated     12/19/2021, wants to lose weight and keep sugar to a minimum     Patient Stated     Lose weight, decrease sugar intake.        This is a list of the screening recommended for you and due dates:  Health Maintenance  Topic Date Due   Zoster (Shingles) Vaccine (1 of 2) 12/24/1970   Medicare Annual Wellness Visit  12/20/2022   Pneumonia Vaccine (3 of 3 - PPSV23 or PCV20) 03/02/2023   Flu Shot  03/04/2023   Hemoglobin A1C  06/24/2023   Complete foot exam   11/26/2023   Yearly kidney health urinalysis for diabetes  12/02/2023   Yearly kidney function blood test for diabetes  01/18/2024   Eye exam for diabetics  01/22/2024   DTaP/Tdap/Td vaccine (3 - Td or Tdap) 05/15/2026   Colon Cancer Screening  01/20/2027   Hepatitis C Screening  Completed   HPV Vaccine  Aged Out   COVID-19 Vaccine  Discontinued    Advanced directives: Advance directive discussed with you today. Even though you declined this today, please call our office should you change your mind, and we can give you the proper paperwork for you to fill out.   Conditions/risks identified: Aim for 30 minutes of exercise or brisk walking, 6-8 glasses of water, and 5 servings of fruits and vegetables each day.   Next appointment: Follow up in one year for your annual wellness visit. 02/21/24 @ 1:30 pm telephone visit  Preventive Care 65 Years and Older, Male  Preventive care refers to lifestyle choices and visits with your health care provider that can promote health and wellness. What does preventive care include? A  yearly physical exam. This is also called an annual well check. Dental exams once or twice a year. Routine eye exams. Ask your health care provider how often you should have your eyes checked. Personal lifestyle choices, including: Daily care of your teeth and gums. Regular physical activity. Eating a healthy diet. Avoiding tobacco and drug use. Limiting alcohol use. Practicing safe sex. Taking low doses of aspirin every day. Taking vitamin and mineral supplements as recommended by your health care provider. What happens during an annual well check? The services and screenings done by your health care provider during your annual well check will depend on your age, overall health, lifestyle risk factors, and family history of disease. Counseling  Your health care provider may ask you questions about your: Alcohol use. Tobacco use. Drug use. Emotional well-being. Home and relationship well-being. Sexual activity. Eating habits. History of falls. Memory and ability to understand (cognition). Work and work Astronomer. Screening  You may have the following tests or measurements: Height, weight, and BMI. Blood pressure. Lipid and cholesterol levels. These may be checked every 5 years, or more frequently if you are over 4 years old. Skin check. Lung cancer screening. You may have this screening every year starting at age 50 if you have a 30-pack-year history of smoking  and currently smoke or have quit within the past 15 years. Fecal occult blood test (FOBT) of the stool. You may have this test every year starting at age 50. Flexible sigmoidoscopy or colonoscopy. You may have a sigmoidoscopy every 5 years or a colonoscopy every 10 years starting at age 50. Prostate cancer screening. Recommendations will vary depending on your family history and other risks. Hepatitis C blood test. Hepatitis B blood test. Sexually transmitted disease (STD) testing. Diabetes screening. This is done by  checking your blood sugar (glucose) after you have not eaten for a while (fasting). You may have this done every 1-3 years. Abdominal aortic aneurysm (AAA) screening. You may need this if you are a current or former smoker. Osteoporosis. You may be screened starting at age 2 if you are at high risk. Talk with your health care provider about your test results, treatment options, and if necessary, the need for more tests. Vaccines  Your health care provider may recommend certain vaccines, such as: Influenza vaccine. This is recommended every year. Tetanus, diphtheria, and acellular pertussis (Tdap, Td) vaccine. You may need a Td booster every 10 years. Zoster vaccine. You may need this after age 32. Pneumococcal 13-valent conjugate (PCV13) vaccine. One dose is recommended after age 46. Pneumococcal polysaccharide (PPSV23) vaccine. One dose is recommended after age 48. Talk to your health care provider about which screenings and vaccines you need and how often you need them. This information is not intended to replace advice given to you by your health care provider. Make sure you discuss any questions you have with your health care provider. Document Released: 08/16/2015 Document Revised: 04/08/2016 Document Reviewed: 05/21/2015 Elsevier Interactive Patient Education  2017 [employer].  Fall Prevention in the Home Falls can cause injuries. They can happen to people of all ages. There are many things you can do to make your home safe and to help prevent falls. What can I do on the outside of my home? Regularly fix the edges of walkways and driveways and fix any cracks. Remove anything that might make you trip as you walk through a door, such as a raised step or threshold. Trim any bushes or trees on the path to your home. Use bright outdoor lighting. Clear any walking paths of anything that might make someone trip, such as rocks or tools. Regularly check to see if handrails are loose or  broken. Make sure that both sides of any steps have handrails. Any raised decks and porches should have guardrails on the edges. Have any leaves, snow, or ice cleared regularly. Use sand or salt on walking paths during winter. Clean up any spills in your garage right away. This includes oil or grease spills. What can I do in the bathroom? Use night lights. Install grab bars by the toilet and in the tub and shower. Do not use towel bars as grab bars. Use non-skid mats or decals in the tub or shower. If you need to sit down in the shower, use a plastic, non-slip stool. Keep the floor dry. Clean up any water that spills on the floor as soon as it happens. Remove soap buildup in the tub or shower regularly. Attach bath mats securely with double-sided non-slip rug tape. Do not have throw rugs and other things on the floor that can make you trip. What can I do in the bedroom? Use night lights. Make sure that you have a light by your bed that is easy to reach. Do not use any  sheets or blankets that are too big for your bed. They should not hang down onto the floor. Have a firm chair that has side arms. You can use this for support while you get dressed. Do not have throw rugs and other things on the floor that can make you trip. What can I do in the kitchen? Clean up any spills right away. Avoid walking on wet floors. Keep items that you use a lot in easy-to-reach places. If you need to reach something above you, use a strong step stool that has a grab bar. Keep electrical cords out of the way. Do not use floor polish or wax that makes floors slippery. If you must use wax, use non-skid floor wax. Do not have throw rugs and other things on the floor that can make you trip. What can I do with my stairs? Do not leave any items on the stairs. Make sure that there are handrails on both sides of the stairs and use them. Fix handrails that are broken or loose. Make sure that handrails are as long as  the stairways. Check any carpeting to make sure that it is firmly attached to the stairs. Fix any carpet that is loose or worn. Avoid having throw rugs at the top or bottom of the stairs. If you do have throw rugs, attach them to the floor with carpet tape. Make sure that you have a light switch at the top of the stairs and the bottom of the stairs. If you do not have them, ask someone to add them for you. What else can I do to help prevent falls? Wear shoes that: Do not have high heels. Have rubber bottoms. Are comfortable and fit you well. Are closed at the toe. Do not wear sandals. If you use a stepladder: Make sure that it is fully opened. Do not climb a closed stepladder. Make sure that both sides of the stepladder are locked into place. Ask someone to hold it for you, if possible. Clearly mark and make sure that you can see: Any grab bars or handrails. First and last steps. Where the edge of each step is. Use tools that help you move around (mobility aids) if they are needed. These include: Canes. Walkers. Scooters. Crutches. Turn on the lights when you go into a dark area. Replace any light bulbs as soon as they burn out. Set up your furniture so you have a clear path. Avoid moving your furniture around. If any of your floors are uneven, fix them. If there are any pets around you, be aware of where they are. Review your medicines with your doctor. Some medicines can make you feel dizzy. This can increase your chance of falling. Ask your doctor what other things that you can do to help prevent falls. This information is not intended to replace advice given to you by your health care provider. Make sure you discuss any questions you have with your health care provider. Document Released: 05/16/2009 Document Revised: 12/26/2015 Document Reviewed: 08/24/2014 Elsevier Interactive Patient Education  2017 [employer].

## 2023-02-16 NOTE — Therapy (Signed)
OUTPATIENT PHYSICAL THERAPY NEURO TREATMENT   Patient Name: Iram Lundberg. MRN: [id number] DOB:04/19/1951, 72 y.o., male Today's Date: 02/16/2023   PCP: Joaquim Nam, MD  REFERRING PROVIDER: Merlene Laughter, DO  END OF SESSION:  PT End of Session - 02/16/23 1145     Visit Number 5    Number of Visits 17    Date for PT Re-Evaluation 03/27/23    Authorization Type Medicare -Part A & B    PT Start Time 1145    PT Stop Time 1229    PT Time Calculation (min) 44 min    Equipment Utilized During Treatment Gait belt    Activity Tolerance Patient tolerated treatment well    Behavior During Therapy [facility] for tasks assessed/performed                Past Medical History:  Diagnosis Date   Anxiety state 03/16/2016   Aphasia 01/26/2017   Benign localized prostatic hyperplasia with lower urinary tract symptoms (LUTS)    BPV (benign positional vertigo) 11/02/2014   Carotid artery stenosis    PER DUPLEX 10-22-2015  BILATERAL ICA 1-39%   Cataract    Coronary artery disease    CABG 2000 with LIMA> LAD, SVG > RCA and free radial > OM. 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   Gout    Gout 07/18/2010   Qualifier: Diagnosis of  By: Para March MD, Cheree Ditto     H/O eye injury    chronic changes to left eye after injury   Hand weakness 03/02/2018   Hearing loss 04/21/2013   History of TIA (transient ischemic attack)    03/ 2017  no residual after brief episode loss peripheral vision   HOH (hard of hearing)    Hyperglycemia 05/17/2016   Hyperlipemia    Hyperlipidemia 07/18/2010   Qualifier: Diagnosis of  By: Para March MD, Graham     Hypertension    HYPERTENSION, BENIGN ESSENTIAL  07/18/2010   Qualifier: Diagnosis of  By: Para March MD, Graham     Hypokalemia 01/26/2017   Knee pain 03/13/2019   Left shoulder pain 09/04/2018   Pneumothorax 10/11/2019   RIGHT    Prostate cancer (HCC) UROLOGIST-  DR GRAPEY/  ONCOLOGIST-  DR MANNING   dx 2015--- Stage T1c, Gleason 3+4, PSA 4.03, vol 44cc   PVC's (premature ventricular contractions)    Rash and nonspecific skin eruption 10/14/2015   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   S/P CABG x 05 Dec 1998   Skin lesion 10/14/2015   Stroke [facility])    TIA (transient ischemic attack) 10/14/2015   Ventral hernia 10/26/2011   Wears glasses    Past Surgical History:  Procedure Laterality Date   APPENDECTOMY  1975   CARDIAC CATHETERIZATION  12-22-2001   dr Verdis Prime   severe native vessel disease mLAD 60-70%,  total occulsion pCFX  and pRCA/  widely patent saphenous vein , free radial , and LIMA grafts/  minminal lv dysfunction, ef 60%   CHEST TUBE INSERTION Right 10/11/2019   CORONARY ARTERY BYPASS GRAFT  12/1998   LIMA to LAD,  SVG to PDA and Diagonal, Free radial graft to OM  CORONARY STENT INTERVENTION N/A 05/26/2021   Procedure: CORONARY STENT INTERVENTION;  Surgeon: Orbie Pyo, MD;  Location: [facility] INVASIVE CV LAB;  Service: Cardiovascular;  Laterality: N/A;   Exericse treadmill test  last one 01-03-2014  dr Verdis Prime   normal exercise tolerance w/ hypertensive repsonse,  no ischemic EKG changes, appropriate HR response & recovery ([facility] TM score 9;  Low Risk , PVC's w/ exertion)   EYE SURGERY     FRACTURE SURGERY     LEFT HEART CATH AND CORS/GRAFTS ANGIOGRAPHY N/A 05/26/2021   Procedure: LEFT HEART CATH AND CORS/GRAFTS ANGIOGRAPHY;  Surgeon: Orbie Pyo, MD;  Location: [facility] INVASIVE CV LAB;  Service: Cardiovascular;  Laterality: N/A;   LOOP RECORDER INSERTION N/A 12/22/2022   Procedure: LOOP RECORDER INSERTION;  Surgeon: Marinus Maw, MD;  Location: [facility] INVASIVE CV LAB;  Service: Cardiovascular;   Laterality: N/A;   PROSTATE BIOPSY     RADIOACTIVE SEED IMPLANT N/A 01/13/2016   Procedure: RADIOACTIVE SEED IMPLANT/BRACHYTHERAPY IMPLANT;  Surgeon: Barron Alvine, MD;  Location: [facility];  Service: Urology;  Laterality: N/A;   Patient Active Problem List   Diagnosis Date Noted   Acute CVA (cerebrovascular accident) (HCC) 12/22/2022   CVA (cerebral vascular accident) (HCC) 12/22/2022   Constipation 03/04/2022   CAD (coronary artery disease) 05/24/2021   NSTEMI (non-ST elevated myocardial infarction) (HCC) 05/24/2021   Pneumothorax 10/11/2019   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   Knee joint symptom 03/13/2019   Left shoulder pain 09/04/2018   Diabetes mellitus without complication (HCC) 03/02/2018   Anxiety state 03/16/2016   Pseudophakia of both eyes 02/24/2016   TIA (transient ischemic attack) 10/14/2015   BPV (benign positional vertigo) 11/02/2014   Advance care planning 05/13/2014   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   Hearing loss 04/21/2013   Ventral hernia 10/26/2011   Medicare annual wellness visit, subsequent 10/26/2011   Prostate cancer (HCC) 10/26/2011   Coronary atherosclerosis 07/19/2010   GERD 07/19/2010   Hyperlipidemia 07/18/2010   Gout 07/18/2010   Hypertension 07/18/2010    ONSET DATE: 12/22/2022  REFERRING DIAG: I63.9 (ICD-10-CM) - Acute CVA (cerebrovascular accident) (HCC)  THERAPY DIAG:  Unsteadiness on feet  Hemiplegia and hemiparesis following cerebral infarction affecting left non-dominant side (HCC)  Other abnormalities of gait and mobility  Muscle weakness (generalized)  History of falling  Rationale for Evaluation and Treatment: Rehabilitation  SUBJECTIVE:                                                                                                                                                                                             SUBJECTIVE  STATEMENT: Pt reports that he saw his cardiologist earlier  this morning and was taken off losartan hcz and now just on losartan. Pt reports no instances of dizziness since BP meds were cut in half. Pt still with ongoing L knee buckling at times, hard to tell when it is going to happen. Pt denies falls or pain.  Pt accompanied by:  [name]  and daughter Inetta Fermo  PERTINENT HISTORY: Pt is 72 yo male who presented on 12/21/22 with intermittent L leg weakness past 3 weeks. 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 Pt discharged on 12/22/22. Pt with hx including but not limited to CAD, HTN, TIA, anxiety, BPPV, HLD, gout, prostate CA, CABG and stents.  Pt also hard of hearing.   Vitals:   02/16/23 1150 02/16/23 1227  BP: 104/63 123/76  Pulse: 78 78      PAIN:  Are you having pain? No  PRECAUTIONS: Fall  WEIGHT BEARING RESTRICTIONS: No  FALLS: Has patient fallen in last 6 months? Yes. Number of falls 5, denies injury, just some bruises   LIVING ENVIRONMENT: Lives with: lives with their spouse and has family closeby and grand son is always home  Lives in: House/apartment Stairs: Yes: Internal: 12 steps; on right going up, level entry into house  Has following equipment at home: Dan Humphreys - 2 wheeled, Environmental consultant - 4 wheeled, shower chair, and Uses standard 2 wheeled RW, uses rollator out in the community as he can sit in it.   PLOF: Independent and Leisure: enjoys biking and his riding his UTV  PATIENT GOALS: Wants to build more strength in L leg  OBJECTIVE:   DIAGNOSTIC FINDINGS: MRI brain wo contrast 12/21/2022: Intermediate sized area of acute ischemia within the right anterior cerebral artery territory. No hemorrhage or mass effect.   COGNITION: Overall cognitive status: Within functional limits for tasks assessed   SENSATION: Light touch: WFL  Reports some tingling in feet at night.   COORDINATION: Heel to shin: slightly slower with LLE   RAM with BUE: impaired coordination with LUE   POSTURE: rounded  shoulders, forward head, and weight shift right  LOWER EXTREMITY ROM:     Decr LLE knee extension AROM due to weakness  LOWER EXTREMITY MMT:    MMT Right Eval Left Eval  Hip flexion 5 4+  Hip extension    Hip abduction 5 5  Hip adduction 5 5  Hip internal rotation    Hip external rotation    Knee flexion 5 5  Knee extension 5 3  Ankle dorsiflexion 5 4  Ankle plantarflexion    Ankle inversion    Ankle eversion    (Blank rows = not tested)  All tested in sitting.    TODAY'S TREATMENT:  TherAct BP assessed at beginning of session while seated and at end of session while seated. No signs/symptoms of OH during session. Vitals:   02/16/23 1150 02/16/23 1227  BP: 104/63 123/76  Pulse: 78 78    In // bars to work on LE strengthening with BUE support and CGA: Alt L/R 4" forward step taps x 10 reps B Alt L/R 4" forward step-ups x 10 reps B Alt L/R 4" lateral step-ups x 10 reps B Lateral sidestepping L/R 3 x 10 ft each direction Increased time needed to initiate movement of LLE  Therapeutic Exercise: Standing mini-squats x 10 reps with RW and CGA Added in 4" cushion behind pt on mat table as target, 2 x 10 reps with RW and CGA   Gait: Gait pattern: decreased hip/knee flexion- Left and trunk flexed Distance walked: various clinic distances Assistive device utilized: Environmental consultant - 2 wheeled Level of assistance: Min A Comments: no instances of LE buckling, pt able to ambulate mat table to/from // bars, no dizziness; pt does need cues for upright posture     PATIENT EDUCATION: Education details: continue to monitor BP and dizziness at home, continue HEP, plan for next session Person educated: Patient and Spouse and daughter Education method: Explanation Education comprehension: verbalized understanding and needs further education  HOME EXERCISE  PROGRAM: Access Code: [email or web address] URL: [email or web address] Date: 02/09/2023 Prepared by: Sherlie Ban  Program Notes Sitting at edge of mat table, with left leg tucked behind right, holding onto chair, gently lean forward and lift bottom off mat and hold for a few seconds and then slowly lower to the mat.   Exercises - Sit to Stand  - 1 x daily - 7 x weekly - 3 sets - 5 reps - Seated Long Arc Quad  - 1 x daily - 7 x weekly - 3 sets - 5 reps - 5 sec hold - Supine Bridge  - 1 x daily - 7 x weekly - 1-2 sets - 10 reps - Supine Hip Adduction Isometric with Ball  - 1 x daily - 7 x weekly - 1-2 sets - 10 reps  GOALS: Goals reviewed with patient? Yes  SHORT TERM GOALS: Target date: 02/23/2023  Pt will be independent with initial HEP with family supervision in order to build upon functional gains made in therapy. Baseline: Goal status: INITIAL  2.  Pt and pt's family will verbalize understanding on fall prevention in the home.  Baseline:  Goal status: INITIAL  3.  Pt will improve Berg score to 6/56 for decreased fall risk Baseline: 2/56 (7/2) Goal status: INITIAL  4. Pt will improve 5x sit<>stand to less than or equal to 20 sec with no UE support to demonstrate improved functional strength and transfer efficiency.  Baseline: 24.5 seconds with no UE support Goal status: INITIAL  5.  Pt will improve gait speed with RW to at least 1.2 ft/sec in order to demo decr fall risk.  Baseline: 44.22 seconds with RW = .74 ft/sec, pt needing one brief standing rest break due to LLE weakness and felt like LLE could buckle Goal status: INITIAL  6.  Pt will be able to stand for 5 minutes with supervision in order to improve standing tolerance for ADLs.  Baseline: Pt's spouse states pt can not stand for too long due to fatigue/weakness  Goal status: INITIAL  LONG TERM GOALS: Target date: 03/23/2023  Pt will be independent with final HEP with family supervision in order to build upon  functional gains  made in therapy. Baseline:  Goal status: INITIAL  2.  Pt will improve Berg score to 10/56 for decreased fall risk Baseline: 2/56 (7/2) Goal status: INITIAL  3.  Pt will improve gait speed with RW vs. LRAD to at least 1.8 ft/sec in order to demo improved household mobility/decr fall risk  Baseline: 44.22 seconds with RW = .74 ft/sec, pt needing one brief standing rest break due to LLE weakness and felt like LLE could buckle Goal status: INITIAL  4.  Pt will ambulate at least 400' with LRAD and supervision (over indoor/outdoor paved surfaces) in order to demo improved household/community mobility.  Baseline:  24.5 seconds with no UE support Goal status: INITIAL  5.  Pt will improve 5x sit<>stand to less than or equal to 16 sec with no UE support to demonstrate improved functional strength and transfer efficiency.  Baseline:  Goal status: INITIAL    ASSESSMENT:  CLINICAL IMPRESSION: Emphasis of skilled PT session on monitoring vitals and working on LE strengthening and coordination in standing position. Pt's vitals WNL this session with improved tolerance for standing activity. Pt also with no instances of LE buckling during gait in therapy gym this date. Pt continues to exhibit impaired timing/control/sequencing of movements with his LLE and continues to benefit from skilled therapy services to work on NMR of his LLE to improve his balance and decrease his fall risk. Continue POC.    OBJECTIVE IMPAIRMENTS: Abnormal gait, decreased activity tolerance, decreased balance, decreased coordination, decreased endurance, decreased knowledge of use of DME, decreased mobility, difficulty walking, decreased ROM, decreased strength, and postural dysfunction.   ACTIVITY LIMITATIONS: carrying, lifting, bending, standing, stairs, transfers, and locomotion level  PARTICIPATION LIMITATIONS: cleaning, driving, shopping, community activity, and yard work  PERSONAL FACTORS: Age,  Behavior pattern, Past/current experiences, Time since onset of injury/illness/exacerbation, and 3+ comorbidities: CAD, HTN, TIA, anxiety, BPPV, HLD, gout, prostate CA, CABG and stents  are also affecting patient's functional outcome.   REHAB POTENTIAL: Good  CLINICAL DECISION MAKING: Evolving/moderate complexity  EVALUATION COMPLEXITY: Moderate  PLAN:  PT FREQUENCY: 2x/week  PT DURATION: 8 weeks  PLANNED INTERVENTIONS: Therapeutic exercises, Therapeutic activity, Neuromuscular re-education, Balance training, Gait training, Patient/Family education, Self Care, Joint mobilization, Stair training, Orthotic/Fit training, DME instructions, Aquatic Therapy, Manual therapy, and Re-evaluation  PLAN FOR NEXT SESSION: CHECK BP!!! Add to HEP as appropriate. Pt with LLE buckling and L lateral lean in unsupported sitting and standing. Gait with RW. Work on sitting balance/maintaining midline and adding safer exercises to HEP. SciFit, Blaze pods, SLS, LLE coordination, add standing exercises to HEP   Peter Congo, PT, DPT, CSRS  02/16/23 3:58 PM

## 2023-02-16 NOTE — Addendum Note (Signed)
Addended by: Franchot Gallo on: 02/16/2023 09:50 AM   Modules accepted: Orders

## 2023-02-16 NOTE — Patient Instructions (Signed)
Medication Instructions:  Your physician has recommended you make the following change in your medication:  1) STOP losartan-hydrochlorothiazide  2) START losartan 25 mg daily  *If you need a refill on your cardiac medications before your next appointment, please call your pharmacy*  Lab Work: None ordered today.  Testing/Procedures: Your physician has requested that you have an echocardiogram in July 2025. Echocardiography is a painless test that uses sound waves to create images of your heart. It provides your doctor with information about the size and shape of your heart and how well your heart's chambers and valves are working. This procedure takes approximately one hour. There are no restrictions for this procedure. Please do NOT wear cologne, perfume, aftershave, or lotions (deodorant is allowed). Please arrive 15 minutes prior to your appointment time.  Follow-Up: At [facility], you and your health needs are our priority.  As part of our continuing mission to provide you with exceptional heart care, we have created designated Provider Care Teams.  These Care Teams include your primary Cardiologist (physician) and Advanced Practice Providers (APPs -  Physician Assistants and Nurse Practitioners) who all work together to provide you with the care you need, when you need it.  Your next appointment:   1 year(s)  The format for your next appointment:   In Person  Provider:   Armanda Magic, MD {  Other Instructions Please check blood pressure at home twice daily over the next week and then call in your readings to Dr. Norris Cross office at [phone], or you may send via MyChart message.

## 2023-02-17 ENCOUNTER — Ambulatory Visit: Payer: Medicare Other | Admitting: Physical Therapy

## 2023-02-17 VITALS — BP 110/63 | HR 76

## 2023-02-17 DIAGNOSIS — I69354 Hemiplegia and hemiparesis following cerebral infarction affecting left non-dominant side: Secondary | ICD-10-CM

## 2023-02-17 DIAGNOSIS — Z9181 History of falling: Secondary | ICD-10-CM | POA: Diagnosis not present

## 2023-02-17 DIAGNOSIS — M6281 Muscle weakness (generalized): Secondary | ICD-10-CM | POA: Diagnosis not present

## 2023-02-17 DIAGNOSIS — R2681 Unsteadiness on feet: Secondary | ICD-10-CM

## 2023-02-17 DIAGNOSIS — I639 Cerebral infarction, unspecified: Secondary | ICD-10-CM | POA: Diagnosis not present

## 2023-02-17 DIAGNOSIS — R2689 Other abnormalities of gait and mobility: Secondary | ICD-10-CM | POA: Diagnosis not present

## 2023-02-17 NOTE — Progress Notes (Signed)
 Carelink Summary Report / Loop Recorder 

## 2023-02-17 NOTE — Therapy (Signed)
OUTPATIENT PHYSICAL THERAPY NEURO TREATMENT   Patient Name: Tyler Guerra. MRN: [id number] DOB:October 16, 1950, 72 y.o., male Today's Date: 02/17/2023   PCP: Joaquim Nam, MD  REFERRING PROVIDER: Merlene Laughter, DO  END OF SESSION:  PT End of Session - 02/17/23 1628     Visit Number 6    Number of Visits 17    Date for PT Re-Evaluation 03/27/23    Authorization Type Medicare -Part A & B    PT Start Time 1625   pt arrived late   PT Stop Time 1700    PT Time Calculation (min) 35 min    Equipment Utilized During Treatment Gait belt    Activity Tolerance Other (comment)   pt limited by impaired balance and decreased LLE strength/control   Behavior During Therapy [facility] for tasks assessed/performed                 Past Medical History:  Diagnosis Date   Anxiety state 03/16/2016   Aphasia 01/26/2017   Benign localized prostatic hyperplasia with lower urinary tract symptoms (LUTS)    BPV (benign positional vertigo) 11/02/2014   Carotid artery stenosis    PER DUPLEX 10-22-2015  BILATERAL ICA 1-39%   Cataract    Coronary artery disease    CABG 2000 with LIMA> LAD, SVG > RCA and free radial > OM. 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   Gout    Gout 07/18/2010   Qualifier: Diagnosis of  By: Para March MD, Cheree Ditto     H/O eye injury    chronic changes to left eye after injury   Hand weakness 03/02/2018   Hearing loss 04/21/2013   History of TIA (transient ischemic attack)    03/ 2017  no residual after brief episode loss peripheral vision   HOH (hard of hearing)    Hyperglycemia 05/17/2016   Hyperlipemia    Hyperlipidemia 07/18/2010   Qualifier: Diagnosis of  By:  Para March MD, Graham     Hypertension    HYPERTENSION, BENIGN ESSENTIAL 07/18/2010   Qualifier: Diagnosis of  By: Para March MD, Graham     Hypokalemia 01/26/2017   Knee pain 03/13/2019   Left shoulder pain 09/04/2018   Pneumothorax 10/11/2019   RIGHT    Prostate cancer (HCC) UROLOGIST-  DR GRAPEY/  ONCOLOGIST-  DR MANNING   dx 2015--- Stage T1c, Gleason 3+4, PSA 4.03, vol 44cc   PVC's (premature ventricular contractions)    Rash and nonspecific skin eruption 10/14/2015   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   S/P CABG x 05 Dec 1998   Skin lesion 10/14/2015   Stroke [facility])    TIA (transient ischemic attack) 10/14/2015   Ventral hernia 10/26/2011   Wears glasses    Past Surgical History:  Procedure Laterality Date   APPENDECTOMY  1975   CARDIAC CATHETERIZATION  12-22-2001   dr Verdis Prime   severe native vessel disease mLAD 60-70%,  total occulsion pCFX  and pRCA/  widely patent saphenous vein , free radial , and LIMA grafts/  minminal lv dysfunction, ef 60%   CHEST TUBE INSERTION Right 10/11/2019   CORONARY ARTERY BYPASS GRAFT  12/1998   LIMA  to LAD,  SVG to PDA and Diagonal, Free radial graft to OM   CORONARY STENT INTERVENTION N/A 05/26/2021   Procedure: CORONARY STENT INTERVENTION;  Surgeon: Orbie Pyo, MD;  Location: [facility] INVASIVE CV LAB;  Service: Cardiovascular;  Laterality: N/A;   Exericse treadmill test  last one 01-03-2014  dr Verdis Prime   normal exercise tolerance w/ hypertensive repsonse,  no ischemic EKG changes, appropriate HR response & recovery ([facility] TM score 9;  Low Risk , PVC's w/ exertion)   EYE SURGERY     FRACTURE SURGERY     LEFT HEART CATH AND CORS/GRAFTS ANGIOGRAPHY N/A 05/26/2021   Procedure: LEFT HEART CATH AND CORS/GRAFTS ANGIOGRAPHY;  Surgeon: Orbie Pyo, MD;  Location: [facility] INVASIVE CV LAB;  Service: Cardiovascular;  Laterality: N/A;   LOOP RECORDER INSERTION N/A 12/22/2022   Procedure: LOOP RECORDER INSERTION;  Surgeon: Marinus Maw, MD;  Location: [facility] INVASIVE CV LAB;  Service: Cardiovascular;  Laterality: N/A;   PROSTATE BIOPSY     RADIOACTIVE SEED IMPLANT N/A 01/13/2016   Procedure: RADIOACTIVE SEED IMPLANT/BRACHYTHERAPY IMPLANT;  Surgeon: Barron Alvine, MD;  Location: [facility];  Service: Urology;  Laterality: N/A;   Patient Active Problem List   Diagnosis Date Noted   Acute CVA (cerebrovascular accident) (HCC) 12/22/2022   CVA (cerebral vascular accident) (HCC) 12/22/2022   Constipation 03/04/2022   CAD (coronary artery disease) 05/24/2021   NSTEMI (non-ST elevated myocardial infarction) (HCC) 05/24/2021   Pneumothorax 10/11/2019   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   Knee joint symptom 03/13/2019   Left shoulder pain 09/04/2018   Diabetes mellitus without complication (HCC) 03/02/2018   Anxiety state 03/16/2016   Pseudophakia of both eyes 02/24/2016   TIA (transient ischemic attack) 10/14/2015   BPV (benign positional vertigo) 11/02/2014   Advance care planning 05/13/2014   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   Hearing loss 04/21/2013   Ventral hernia 10/26/2011   Medicare annual wellness visit, subsequent 10/26/2011   Prostate cancer (HCC) 10/26/2011   Coronary atherosclerosis 07/19/2010   GERD 07/19/2010   Hyperlipidemia 07/18/2010   Gout 07/18/2010   Hypertension 07/18/2010    ONSET DATE: 12/22/2022  REFERRING DIAG: I63.9 (ICD-10-CM) - Acute CVA (cerebrovascular accident) (HCC)  THERAPY DIAG:  Unsteadiness on feet  Hemiplegia and hemiparesis following cerebral infarction affecting left non-dominant side (HCC)  Other abnormalities of gait and mobility  Muscle weakness (generalized)  History of falling  Rationale for Evaluation and Treatment: Rehabilitation  SUBJECTIVE:  SUBJECTIVE STATEMENT: Pt's wife reports that patient had trouble getting out of the car for his appointment today. Pt reports he is having trouble controlling his LLE and had to use his hands to lift his leg. No falls or other acute changes. Pt reports he has tried some walking at home without his walker but almost lost his balance, encouraged him to keep using his walker for safety.  Pt accompanied by:  [name]   PERTINENT HISTORY: Pt is 72 yo male who presented on 12/21/22 with intermittent L leg weakness past 3 weeks. 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 Pt discharged on 12/22/22. Pt with hx including but not limited to CAD, HTN, TIA, anxiety, BPPV, HLD, gout, prostate CA, CABG and stents.  Pt also hard of hearing.   Vitals:   02/17/23 1636  BP: 110/63  Pulse: 76       PAIN:  Are you having pain? No  PRECAUTIONS: Fall  WEIGHT BEARING RESTRICTIONS: No  FALLS: Has patient fallen in last 6 months? Yes. Number of falls 5, denies injury, just some bruises   LIVING ENVIRONMENT: Lives with: lives with their spouse and has family closeby and grand son is always home  Lives in: House/apartment Stairs: Yes: Internal: 12 steps; on right going up, level entry into house  Has following equipment at home: Dan Humphreys - 2 wheeled, Environmental consultant - 4 wheeled, shower chair, and Uses standard 2 wheeled RW, uses rollator out in the community as he can sit in it.   PLOF: Independent and Leisure: enjoys biking and his riding his UTV  PATIENT GOALS: Wants to build more strength in L leg  OBJECTIVE:   DIAGNOSTIC FINDINGS: MRI brain wo contrast 12/21/2022: Intermediate sized area of acute ischemia within the right anterior cerebral artery territory. No hemorrhage or mass effect.   COGNITION: Overall cognitive status: Within functional limits for tasks assessed   SENSATION: Light touch: WFL  Reports some tingling in feet at night.   COORDINATION: Heel to  shin: slightly slower with LLE   RAM with BUE: impaired coordination with LUE   POSTURE: rounded shoulders, forward head, and weight shift right  LOWER EXTREMITY ROM:     Decr LLE knee extension AROM due to weakness  LOWER EXTREMITY MMT:    MMT Right Eval Left Eval  Hip flexion 5 4+  Hip extension    Hip abduction 5 5  Hip adduction 5 5  Hip internal rotation    Hip external rotation    Knee flexion 5 5  Knee extension 5 3  Ankle dorsiflexion 5 4  Ankle plantarflexion    Ankle inversion    Ankle eversion    (Blank rows = not tested)  All tested in sitting.    TODAY'S TREATMENT:  TherAct BP assessed at beginning of session while seated and at end of session while seated. No signs/symptoms of OH during session. Vitals:   02/17/23 1636  BP: 110/63  Pulse: 76    Sit to stand with min A to RW. Attempt to have pt perform gait with RW but he has difficulty advancing his LLE, transitioned to sitting on mat table for safety. Once seated EOM pt with difficulty maintaining his balance with L lateral lean and needs min A in sitting to prevent a fall. Pt performed alt UE reaching outside BOS and across midline with no LOB noted. Pt then performed alt LE soccer ball kicks with no LOB noted. Pt then appears to have improved sitting balance with no lateral lean. Sit to stand again to RW with min A. Ambulation x 5 ft with RW and mod A for balance, onset of L lateral lean and L knee buckling, safely assisted to sitting on his w/c. Pt assisted out to his car due to increase in balance impairments this session. Pt able to perform transfer back into his car with min A though he climbs into car due to seat height [facility]) vs turning to sit on seat. Pt also noted to have somewhat increased confusion, difficulty following cues, and increased restlessness this  session.   PATIENT EDUCATION: Education details: continue to monitor BP and dizziness at home, continue HEP as able, continue to use RW AT ALL TIMES due to random onset of balance impairments and LLE giving out Person educated: Patient and Spouse Education method: Explanation Education comprehension: verbalized understanding and needs further education  HOME EXERCISE PROGRAM: Access Code: [email or web address] URL: [email or web address] Date: 02/09/2023 Prepared by: Sherlie Ban  Program Notes Sitting at edge of mat table, with left leg tucked behind right, holding onto chair, gently lean forward and lift bottom off mat and hold for a few seconds and then slowly lower to the mat.   Exercises - Sit to Stand  - 1 x daily - 7 x weekly - 3 sets - 5 reps - Seated Long Arc Quad  - 1 x daily - 7 x weekly - 3 sets - 5 reps - 5 sec hold - Supine Bridge  - 1 x daily - 7 x weekly - 1-2 sets - 10 reps - Supine Hip Adduction Isometric with Ball  - 1 x daily - 7 x weekly - 1-2 sets - 10 reps  GOALS: Goals reviewed with patient? Yes  SHORT TERM GOALS: Target date: 02/23/2023  Pt will be independent with initial HEP with family supervision in order to build upon functional gains made in therapy. Baseline: Goal status: INITIAL  2.  Pt and pt's family will verbalize understanding on fall prevention in the home.  Baseline:  Goal status: INITIAL  3.  Pt will improve Berg score to 6/56 for decreased fall risk Baseline: 2/56 (7/2) Goal status: INITIAL  4. Pt will improve 5x sit<>stand to less than or equal to 20 sec with no UE support to demonstrate improved functional strength and transfer efficiency.  Baseline: 24.5 seconds with no UE support Goal status: INITIAL  5.  Pt will improve gait speed with RW to at least 1.2 ft/sec in order to demo decr fall risk.  Baseline: 44.22 seconds with RW = .74 ft/sec, pt needing one brief standing rest break due to LLE weakness and felt like LLE could  buckle Goal status: INITIAL  6.  Pt will be able to stand for 5 minutes with supervision  in order to improve standing tolerance for ADLs.  Baseline: Pt's spouse states pt can not stand for too long due to fatigue/weakness  Goal status: INITIAL  LONG TERM GOALS: Target date: 03/23/2023  Pt will be independent with final HEP with family supervision in order to build upon functional gains made in therapy. Baseline:  Goal status: INITIAL  2.  Pt will improve Berg score to 10/56 for decreased fall risk Baseline: 2/56 (7/2) Goal status: INITIAL  3.  Pt will improve gait speed with RW vs. LRAD to at least 1.8 ft/sec in order to demo improved household mobility/decr fall risk  Baseline: 44.22 seconds with RW = .74 ft/sec, pt needing one brief standing rest break due to LLE weakness and felt like LLE could buckle Goal status: INITIAL  4.  Pt will ambulate at least 400' with LRAD and supervision (over indoor/outdoor paved surfaces) in order to demo improved household/community mobility.  Baseline:  24.5 seconds with no UE support Goal status: INITIAL  5.  Pt will improve 5x sit<>stand to less than or equal to 16 sec with no UE support to demonstrate improved functional strength and transfer efficiency.  Baseline:  Goal status: INITIAL    ASSESSMENT:  CLINICAL IMPRESSION: Emphasis of skilled PT session on continuing to assess vitals, working on sitting balance, and attempting to perform standing activities as safe and able. Initial plan for this session was to have pt work on standing exercises for his HEP, but he exhibits significantly impaired function as compared to session yesterday with increased loss of balance and LLE giving out on him in standing. Pt limited by late arrival as well as impaired balance this date. Pt continues to benefit from skilled therapy services to work on improving his overall balance, strength, and endurance in order to increase his safety and independence with  functional mobility and decrease his fall risk. Continue POC.    OBJECTIVE IMPAIRMENTS: Abnormal gait, decreased activity tolerance, decreased balance, decreased coordination, decreased endurance, decreased knowledge of use of DME, decreased mobility, difficulty walking, decreased ROM, decreased strength, and postural dysfunction.   ACTIVITY LIMITATIONS: carrying, lifting, bending, standing, stairs, transfers, and locomotion level  PARTICIPATION LIMITATIONS: cleaning, driving, shopping, community activity, and yard work  PERSONAL FACTORS: Age, Behavior pattern, Past/current experiences, Time since onset of injury/illness/exacerbation, and 3+ comorbidities: CAD, HTN, TIA, anxiety, BPPV, HLD, gout, prostate CA, CABG and stents  are also affecting patient's functional outcome.   REHAB POTENTIAL: Good  CLINICAL DECISION MAKING: Evolving/moderate complexity  EVALUATION COMPLEXITY: Moderate  PLAN:  PT FREQUENCY: 2x/week  PT DURATION: 8 weeks  PLANNED INTERVENTIONS: Therapeutic exercises, Therapeutic activity, Neuromuscular re-education, Balance training, Gait training, Patient/Family education, Self Care, Joint mobilization, Stair training, Orthotic/Fit training, DME instructions, Aquatic Therapy, Manual therapy, and Re-evaluation  PLAN FOR NEXT SESSION: CHECK BP!!! Add to HEP as appropriate. Pt with LLE buckling and L lateral lean in unsupported sitting and standing. Gait with RW. Work on sitting balance/maintaining midline and adding safer exercises to HEP. SciFit, Blaze pods, SLS, LLE coordination, add standing exercises to HEP if pt safe and able   Peter Congo, PT, DPT, CSRS  02/17/23 5:03 PM

## 2023-02-19 ENCOUNTER — Ambulatory Visit: Payer: Medicare Other | Admitting: Physical Therapy

## 2023-02-23 ENCOUNTER — Ambulatory Visit: Payer: Medicare Other | Admitting: Physical Therapy

## 2023-02-23 VITALS — BP 121/77 | HR 62

## 2023-02-23 DIAGNOSIS — I69354 Hemiplegia and hemiparesis following cerebral infarction affecting left non-dominant side: Secondary | ICD-10-CM

## 2023-02-23 DIAGNOSIS — M6281 Muscle weakness (generalized): Secondary | ICD-10-CM

## 2023-02-23 DIAGNOSIS — Z9181 History of falling: Secondary | ICD-10-CM

## 2023-02-23 DIAGNOSIS — R2681 Unsteadiness on feet: Secondary | ICD-10-CM | POA: Diagnosis not present

## 2023-02-23 DIAGNOSIS — I639 Cerebral infarction, unspecified: Secondary | ICD-10-CM | POA: Diagnosis not present

## 2023-02-23 DIAGNOSIS — R2689 Other abnormalities of gait and mobility: Secondary | ICD-10-CM

## 2023-02-23 NOTE — Therapy (Signed)
OUTPATIENT PHYSICAL THERAPY NEURO TREATMENT   Patient Name: Tyler Guerra. MRN: [id number] DOB:1951/06/15, 71 y.o., male Today's Date: 02/23/2023   PCP: Joaquim Nam, MD  REFERRING PROVIDER: Merlene Laughter, DO  END OF SESSION:  PT End of Session - 02/23/23 1324     Visit Number 7    Number of Visits 17    Date for PT Re-Evaluation 03/27/23    Authorization Type Medicare -Part A & B    PT Start Time 1324   pt arrived late   PT Stop Time 1400    PT Time Calculation (min) 36 min    Equipment Utilized During Treatment Gait belt    Activity Tolerance Patient tolerated treatment well    Behavior During Therapy [facility] for tasks assessed/performed                  Past Medical History:  Diagnosis Date   Anxiety state 03/16/2016   Aphasia 01/26/2017   Benign localized prostatic hyperplasia with lower urinary tract symptoms (LUTS)    BPV (benign positional vertigo) 11/02/2014   Carotid artery stenosis    PER DUPLEX 10-22-2015  BILATERAL ICA 1-39%   Cataract    Coronary artery disease    CABG 2000 with LIMA> LAD, SVG > RCA and free radial > OM. 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   Gout    Gout 07/18/2010   Qualifier: Diagnosis of  By: Para March MD, Cheree Ditto     H/O eye injury    chronic changes to left eye after injury   Hand weakness 03/02/2018   Hearing loss 04/21/2013   History of TIA (transient ischemic attack)    03/ 2017  no residual after brief episode loss peripheral vision   HOH (hard of hearing)    Hyperglycemia 05/17/2016   Hyperlipemia    Hyperlipidemia 07/18/2010   Qualifier: Diagnosis of  By: Para March MD, Graham     Hypertension     HYPERTENSION, BENIGN ESSENTIAL 07/18/2010   Qualifier: Diagnosis of  By: Para March MD, Graham     Hypokalemia 01/26/2017   Knee pain 03/13/2019   Left shoulder pain 09/04/2018   Pneumothorax 10/11/2019   RIGHT    Prostate cancer (HCC) UROLOGIST-  DR GRAPEY/  ONCOLOGIST-  DR MANNING   dx 2015--- Stage T1c, Gleason 3+4, PSA 4.03, vol 44cc   PVC's (premature ventricular contractions)    Rash and nonspecific skin eruption 10/14/2015   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   S/P CABG x 05 Dec 1998   Skin lesion 10/14/2015   Stroke [facility])    TIA (transient ischemic attack) 10/14/2015   Ventral hernia 10/26/2011   Wears glasses    Past Surgical History:  Procedure Laterality Date   APPENDECTOMY  1975   CARDIAC CATHETERIZATION  12-22-2001   dr Verdis Prime   severe native vessel disease mLAD 60-70%,  total occulsion pCFX  and pRCA/  widely patent saphenous vein , free radial , and LIMA grafts/  minminal lv dysfunction, ef 60%   CHEST TUBE INSERTION Right 10/11/2019   CORONARY ARTERY BYPASS GRAFT  12/1998   LIMA to LAD,  SVG to PDA and  Diagonal, Free radial graft to OM   CORONARY STENT INTERVENTION N/A 05/26/2021   Procedure: CORONARY STENT INTERVENTION;  Surgeon: Orbie Pyo, MD;  Location: [facility] INVASIVE CV LAB;  Service: Cardiovascular;  Laterality: N/A;   Exericse treadmill test  last one 01-03-2014  dr Verdis Prime   normal exercise tolerance w/ hypertensive repsonse,  no ischemic EKG changes, appropriate HR response & recovery ([facility] TM score 9;  Low Risk , PVC's w/ exertion)   EYE SURGERY     FRACTURE SURGERY     LEFT HEART CATH AND CORS/GRAFTS ANGIOGRAPHY N/A 05/26/2021   Procedure: LEFT HEART CATH AND CORS/GRAFTS ANGIOGRAPHY;  Surgeon: Orbie Pyo, MD;  Location: [facility] INVASIVE CV LAB;  Service: Cardiovascular;  Laterality: N/A;   LOOP RECORDER INSERTION N/A 12/22/2022   Procedure: LOOP RECORDER INSERTION;  Surgeon: Marinus Maw, MD;  Location: [facility] INVASIVE CV LAB;   Service: Cardiovascular;  Laterality: N/A;   PROSTATE BIOPSY     RADIOACTIVE SEED IMPLANT N/A 01/13/2016   Procedure: RADIOACTIVE SEED IMPLANT/BRACHYTHERAPY IMPLANT;  Surgeon: Barron Alvine, MD;  Location: [facility];  Service: Urology;  Laterality: N/A;   Patient Active Problem List   Diagnosis Date Noted   Acute CVA (cerebrovascular accident) (HCC) 12/22/2022   CVA (cerebral vascular accident) (HCC) 12/22/2022   Constipation 03/04/2022   CAD (coronary artery disease) 05/24/2021   NSTEMI (non-ST elevated myocardial infarction) (HCC) 05/24/2021   Pneumothorax 10/11/2019   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   Knee joint symptom 03/13/2019   Left shoulder pain 09/04/2018   Diabetes mellitus without complication (HCC) 03/02/2018   Anxiety state 03/16/2016   Pseudophakia of both eyes 02/24/2016   TIA (transient ischemic attack) 10/14/2015   BPV (benign positional vertigo) 11/02/2014   Advance care planning 05/13/2014   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   Hearing loss 04/21/2013   Ventral hernia 10/26/2011   Medicare annual wellness visit, subsequent 10/26/2011   Prostate cancer (HCC) 10/26/2011   Coronary atherosclerosis 07/19/2010   GERD 07/19/2010   Hyperlipidemia 07/18/2010   Gout 07/18/2010   Hypertension 07/18/2010    ONSET DATE: 12/22/2022  REFERRING DIAG: I63.9 (ICD-10-CM) - Acute CVA (cerebrovascular accident) (HCC)  THERAPY DIAG:  Unsteadiness on feet  Hemiplegia and hemiparesis following cerebral infarction affecting left non-dominant side (HCC)  Other abnormalities of gait and mobility  Muscle weakness (generalized)  History of falling  Rationale for Evaluation and Treatment: Rehabilitation  SUBJECTIVE:  SUBJECTIVE STATEMENT: Pt reports he has been  having good days and bad days, [name] has been keeping a BP log and plans to send that to his doctor. Pt reports he started out having a good day but getting out to his car to come to this appointment was difficult, was a little shaky on his legs.  Pt accompanied by:  [name]   PERTINENT HISTORY: Pt is 72 yo male who presented on 12/21/22 with intermittent L leg weakness past 3 weeks. 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 Pt discharged on 12/22/22. Pt with hx including but not limited to CAD, HTN, TIA, anxiety, BPPV, HLD, gout, prostate CA, CABG and stents.  Pt also hard of hearing.   Vitals:   02/23/23 1329  BP: 121/77  Pulse: 62        PAIN:  Are you having pain? No  PRECAUTIONS: Fall  WEIGHT BEARING RESTRICTIONS: No  FALLS: Has patient fallen in last 6 months? Yes. Number of falls 5, denies injury, just some bruises   LIVING ENVIRONMENT: Lives with: lives with their spouse and has family closeby and grand son is always home  Lives in: House/apartment Stairs: Yes: Internal: 12 steps; on right going up, level entry into house  Has following equipment at home: Dan Humphreys - 2 wheeled, Environmental consultant - 4 wheeled, shower chair, and Uses standard 2 wheeled RW, uses rollator out in the community as he can sit in it.   PLOF: Independent and Leisure: enjoys biking and his riding his UTV  PATIENT GOALS: Wants to build more strength in L leg  OBJECTIVE:   DIAGNOSTIC FINDINGS: MRI brain wo contrast 12/21/2022: Intermediate sized area of acute ischemia within the right anterior cerebral artery territory. No hemorrhage or mass effect.   COGNITION: Overall cognitive status: Within functional limits for tasks assessed   SENSATION: Light touch: WFL  Reports some tingling in feet at night.   COORDINATION: Heel to shin: slightly slower with LLE   RAM with BUE: impaired coordination with LUE   POSTURE: rounded shoulders, forward head, and weight  shift right  LOWER EXTREMITY ROM:     Decr LLE knee extension AROM due to weakness  LOWER EXTREMITY MMT:    MMT Right Eval Left Eval  Hip flexion 5 4+  Hip extension    Hip abduction 5 5  Hip adduction 5 5  Hip internal rotation    Hip external rotation    Knee flexion 5 5  Knee extension 5 3  Ankle dorsiflexion 5 4  Ankle plantarflexion    Ankle inversion    Ankle eversion    (Blank rows = not tested)  All tested in sitting.    TODAY'S TREATMENT:                                                                                                                               TherAct BP assessed at  beginning of session while seated and at end of session while seated. No signs/symptoms of OH during session. Vitals:   02/23/23 1329  BP: 121/77  Pulse: 62     For STG assessment:  [facility] PT Assessment - 02/23/23 1337       Ambulation/Gait   Gait velocity 32.8 ft over 25.28 sec = 1.3 ft/sec      Standardized Balance Assessment   Standardized Balance Assessment Berg Balance Test;Five Times Sit to Stand    Five times sit to stand comments  16.13 sec   hands on thighs     Berg Balance Test   Sit to Stand Able to stand using hands after several tries    Standing Unsupported Able to stand 30 seconds unsupported    Sitting with Back Unsupported but Feet Supported on Floor or Stool Able to sit 2 minutes under supervision    Stand to Sit Controls descent by using hands    Transfers Needs one person to assist    Standing Unsupported with Eyes Closed Needs help to keep from falling    Standing Unsupported with Feet Together Needs help to attain position and unable to hold for 15 seconds    From Standing, Reach Forward with Outstretched Arm Loses balance while trying/requires external support    From Standing Position, Pick up Object from Floor Unable to try/needs assist to keep balance    From Standing Position, Turn to Look Behind Over each Shoulder Needs supervision when  turning    Turn 360 Degrees Needs assistance while turning    Standing Unsupported, Alternately Place Feet on Step/Stool Needs assistance to keep from falling or unable to try    Standing Unsupported, One Foot in [employer] balance while stepping or standing    Standing on One Leg Unable to try or needs assist to prevent fall    Total Score 12    Berg comment: 12/56, high fall risk             TherEx Reviewed standing exercises that patient can perform at home on his "good" days: Standing mini-squats x 10 reps Standing alt L/R 4" step-taps  Added to HEP, see bolded below  PATIENT EDUCATION: Education details: continue to monitor BP and dizziness at home, continue HEP as able, continue to use RW AT ALL TIMES due to random onset of balance impairments and LLE giving out, added to HEP Person educated: Patient and Spouse Education method: Explanation, Demonstration, and Handouts Education comprehension: verbalized understanding and needs further education  HOME EXERCISE PROGRAM: Access Code: [email or web address] URL: [email or web address] Date: 02/09/2023 Prepared by: Sherlie Ban  Program Notes Sitting at edge of mat table, with left leg tucked behind right, holding onto chair, gently lean forward and lift bottom off mat and hold for a few seconds and then slowly lower to the mat.   Exercises - Sit to Stand  - 1 x daily - 7 x weekly - 3 sets - 5 reps - Seated Long Arc Quad  - 1 x daily - 7 x weekly - 3 sets - 5 reps - 5 sec hold - Supine Bridge  - 1 x daily - 7 x weekly - 1-2 sets - 10 reps - Supine Hip Adduction Isometric with Ball  - 1 x daily - 7 x weekly - 1-2 sets - 10 reps - Standing Forward Step Taps with Counter Support  - 1 x daily - 7 x weekly - 3 sets - 10 reps - Mini Squat with  Counter Support  - 1 x daily - 7 x weekly - 3 sets - 10 reps  GOALS: Goals reviewed with patient? Yes  SHORT TERM GOALS: Target date: 02/23/2023  Pt will be independent with  initial HEP with family supervision in order to build upon functional gains made in therapy. Baseline: Goal status: MET  2.  Pt and pt's family will verbalize understanding on fall prevention in the home.  Baseline:  Goal status: IN PROGRESS  3.  Pt will improve Berg score to 6/56 for decreased fall risk Baseline: 2/56 (7/2), 12/56 (7/23) Goal status: MET  4. Pt will improve 5x sit<>stand to less than or equal to 20 sec with no UE support to demonstrate improved functional strength and transfer efficiency.  Baseline: 24.5 seconds with no UE support, 16.13 sec (7/23) Goal status: MET  5.  Pt will improve gait speed with RW to at least 1.2 ft/sec in order to demo decr fall risk.  Baseline: 44.22 seconds with RW = .74 ft/sec, pt needing one brief standing rest break due to LLE weakness and felt like LLE could buckle, 1.3 ft/sec with RW (7/23) Goal status: MET  6.  Pt will be able to stand for 5 minutes with supervision in order to improve standing tolerance for ADLs.  Baseline: Pt's spouse states pt can not stand for too long due to fatigue/weakness, pt unable to consistently stand for 5 min without LOB to the L  Goal status: IN PROGRESS  LONG TERM GOALS: Target date: 03/23/2023  Pt will be independent with final HEP with family supervision in order to build upon functional gains made in therapy. Baseline:  Goal status: INITIAL  2.  Pt will improve Berg score to 10/56 for decreased fall risk Baseline: 2/56 (7/2) Goal status: INITIAL  3.  Pt will improve gait speed with RW vs. LRAD to at least 1.8 ft/sec in order to demo improved household mobility/decr fall risk  Baseline: 44.22 seconds with RW = .74 ft/sec, pt needing one brief standing rest break due to LLE weakness and felt like LLE could buckle Goal status: INITIAL  4.  Pt will ambulate at least 400' with LRAD and supervision (over indoor/outdoor paved surfaces) in order to demo improved household/community mobility.  Baseline:   24.5 seconds with no UE support Goal status: INITIAL  5.  Pt will improve 5x sit<>stand to less than or equal to 16 sec with no UE support to demonstrate improved functional strength and transfer efficiency.  Baseline:  Goal status: INITIAL    ASSESSMENT:  CLINICAL IMPRESSION: Emphasis of skilled PT session on assessing STG and adding standing exercises to HEP for when pt is having "good" days and he is able to safely perform them. Pt has met 4/6 STG due to improving his gait speed, score on the Berg, 5xSTS score, and being independent with his initial HEP. Pt does need ongoing education about his high fall risk and how to be safer in the home to prevent a fall. Additionally, his standing balance remains impaired and is unable to stand x 5 min without LOB to the L. Pt continues to exhibit intermittent performance during therapy sessions having "good" days and "bad" days which affect his performance and what he is able to safely work on in therapy and at home. Pt continues to benefit from skilled therapy services to work towards increasing his safety and independence with functional mobility. Continue POC.    OBJECTIVE IMPAIRMENTS: Abnormal gait, decreased activity tolerance, decreased balance,  decreased coordination, decreased endurance, decreased knowledge of use of DME, decreased mobility, difficulty walking, decreased ROM, decreased strength, and postural dysfunction.   ACTIVITY LIMITATIONS: carrying, lifting, bending, standing, stairs, transfers, and locomotion level  PARTICIPATION LIMITATIONS: cleaning, driving, shopping, community activity, and yard work  PERSONAL FACTORS: Age, Behavior pattern, Past/current experiences, Time since onset of injury/illness/exacerbation, and 3+ comorbidities: CAD, HTN, TIA, anxiety, BPPV, HLD, gout, prostate CA, CABG and stents  are also affecting patient's functional outcome.   REHAB POTENTIAL: Good  CLINICAL DECISION MAKING: Evolving/moderate  complexity  EVALUATION COMPLEXITY: Moderate  PLAN:  PT FREQUENCY: 2x/week  PT DURATION: 8 weeks  PLANNED INTERVENTIONS: Therapeutic exercises, Therapeutic activity, Neuromuscular re-education, Balance training, Gait training, Patient/Family education, Self Care, Joint mobilization, Stair training, Orthotic/Fit training, DME instructions, Aquatic Therapy, Manual therapy, and Re-evaluation  PLAN FOR NEXT SESSION: CHECK BP!!! Add to HEP as appropriate. Pt with LLE buckling and L lateral lean in unsupported sitting and standing. Gait with RW. Work on sitting balance/maintaining midline and adding safer exercises to HEP. SciFit, Blaze pods, SLS, LLE coordination, how are added standing exercises?, work on unsupported standing as safe and able   Peter Congo, PT, DPT, CSRS  02/23/23 2:01 PM

## 2023-02-25 ENCOUNTER — Encounter: Payer: Self-pay | Admitting: Cardiology

## 2023-02-25 MED ORDER — LOSARTAN POTASSIUM 25 MG PO TABS: 12.5000 mg | ORAL_TABLET | Freq: Every day | ORAL | Status: DC

## 2023-02-26 ENCOUNTER — Encounter (HOSPITAL_COMMUNITY): Payer: Self-pay | Admitting: Cardiology

## 2023-02-26 ENCOUNTER — Ambulatory Visit: Payer: Medicare Other | Admitting: Physical Therapy

## 2023-02-26 ENCOUNTER — Encounter: Payer: Self-pay | Admitting: Physical Therapy

## 2023-02-26 VITALS — BP 94/64 | HR 76

## 2023-02-26 DIAGNOSIS — I69354 Hemiplegia and hemiparesis following cerebral infarction affecting left non-dominant side: Secondary | ICD-10-CM

## 2023-02-26 DIAGNOSIS — R2689 Other abnormalities of gait and mobility: Secondary | ICD-10-CM

## 2023-02-26 DIAGNOSIS — M6281 Muscle weakness (generalized): Secondary | ICD-10-CM

## 2023-02-26 DIAGNOSIS — Z9181 History of falling: Secondary | ICD-10-CM | POA: Diagnosis not present

## 2023-02-26 DIAGNOSIS — I639 Cerebral infarction, unspecified: Secondary | ICD-10-CM | POA: Diagnosis not present

## 2023-02-26 DIAGNOSIS — R2681 Unsteadiness on feet: Secondary | ICD-10-CM | POA: Diagnosis not present

## 2023-02-26 NOTE — Therapy (Signed)
OUTPATIENT PHYSICAL THERAPY NEURO TREATMENT   Patient Name: Tyler Guerra. MRN: [id number] DOB:19-Dec-1950, 72 y.o., male Today's Date: 02/26/2023   PCP: Joaquim Nam, MD  REFERRING PROVIDER: Merlene Laughter, DO  END OF SESSION:  PT End of Session - 02/26/23 1319     Visit Number 8    Number of Visits 17    Date for PT Re-Evaluation 03/27/23    Authorization Type Medicare -Part A & B    PT Start Time 1316    PT Stop Time 1356    PT Time Calculation (min) 40 min    Equipment Utilized During Treatment Gait belt    Activity Tolerance Patient tolerated treatment well    Behavior During Therapy [facility] for tasks assessed/performed                  Past Medical History:  Diagnosis Date   Anxiety state 03/16/2016   Aphasia 01/26/2017   Benign localized prostatic hyperplasia with lower urinary tract symptoms (LUTS)    BPV (benign positional vertigo) 11/02/2014   Carotid artery stenosis    PER DUPLEX 10-22-2015  BILATERAL ICA 1-39%   Cataract    Coronary artery disease    CABG 2000 with LIMA> LAD, SVG > RCA and free radial > OM. 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   Gout    Gout 07/18/2010   Qualifier: Diagnosis of  By: Para March MD, Cheree Ditto     H/O eye injury    chronic changes to left eye after injury   Hand weakness 03/02/2018   Hearing loss 04/21/2013   History of TIA (transient ischemic attack)    03/ 2017  no residual after brief episode loss peripheral vision   HOH (hard of hearing)    Hyperglycemia 05/17/2016   Hyperlipemia    Hyperlipidemia 07/18/2010   Qualifier: Diagnosis of  By: Para March MD, Graham     Hypertension    HYPERTENSION, BENIGN  ESSENTIAL 07/18/2010   Qualifier: Diagnosis of  By: Para March MD, Graham     Hypokalemia 01/26/2017   Knee pain 03/13/2019   Left shoulder pain 09/04/2018   Pneumothorax 10/11/2019   RIGHT    Prostate cancer (HCC) UROLOGIST-  DR GRAPEY/  ONCOLOGIST-  DR MANNING   dx 2015--- Stage T1c, Gleason 3+4, PSA 4.03, vol 44cc   PVC's (premature ventricular contractions)    Rash and nonspecific skin eruption 10/14/2015   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   S/P CABG x 05 Dec 1998   Skin lesion 10/14/2015   Stroke [facility])    TIA (transient ischemic attack) 10/14/2015   Ventral hernia 10/26/2011   Wears glasses    Past Surgical History:  Procedure Laterality Date   APPENDECTOMY  1975   CARDIAC CATHETERIZATION  12-22-2001   dr Verdis Prime   severe native vessel disease mLAD 60-70%,  total occulsion pCFX  and pRCA/  widely patent saphenous vein , free radial , and LIMA grafts/  minminal lv dysfunction, ef 60%   CHEST TUBE INSERTION Right 10/11/2019   CORONARY ARTERY BYPASS GRAFT  12/1998   LIMA to LAD,  SVG to PDA and Diagonal, Free radial graft  to OM   CORONARY STENT INTERVENTION N/A 05/26/2021   Procedure: CORONARY STENT INTERVENTION;  Surgeon: Orbie Pyo, MD;  Location: [facility] INVASIVE CV LAB;  Service: Cardiovascular;  Laterality: N/A;   Exericse treadmill test  last one 01-03-2014  dr Verdis Prime   normal exercise tolerance w/ hypertensive repsonse,  no ischemic EKG changes, appropriate HR response & recovery ([facility] TM score 9;  Low Risk , PVC's w/ exertion)   EYE SURGERY     FRACTURE SURGERY     LEFT HEART CATH AND CORS/GRAFTS ANGIOGRAPHY N/A 05/26/2021   Procedure: LEFT HEART CATH AND CORS/GRAFTS ANGIOGRAPHY;  Surgeon: Orbie Pyo, MD;  Location: [facility] INVASIVE CV LAB;  Service: Cardiovascular;  Laterality: N/A;   LOOP RECORDER INSERTION N/A 12/22/2022   Procedure: LOOP RECORDER INSERTION;  Surgeon: Marinus Maw, MD;  Location: [facility] INVASIVE CV LAB;  Service:  Cardiovascular;  Laterality: N/A;   PROSTATE BIOPSY     RADIOACTIVE SEED IMPLANT N/A 01/13/2016   Procedure: RADIOACTIVE SEED IMPLANT/BRACHYTHERAPY IMPLANT;  Surgeon: Barron Alvine, MD;  Location: [facility];  Service: Urology;  Laterality: N/A;   Patient Active Problem List   Diagnosis Date Noted   Acute CVA (cerebrovascular accident) (HCC) 12/22/2022   CVA (cerebral vascular accident) (HCC) 12/22/2022   Constipation 03/04/2022   CAD (coronary artery disease) 05/24/2021   NSTEMI (non-ST elevated myocardial infarction) (HCC) 05/24/2021   Pneumothorax 10/11/2019   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   Knee joint symptom 03/13/2019   Left shoulder pain 09/04/2018   Diabetes mellitus without complication (HCC) 03/02/2018   Anxiety state 03/16/2016   Pseudophakia of both eyes 02/24/2016   TIA (transient ischemic attack) 10/14/2015   BPV (benign positional vertigo) 11/02/2014   Advance care planning 05/13/2014   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   Hearing loss 04/21/2013   Ventral hernia 10/26/2011   Medicare annual wellness visit, subsequent 10/26/2011   Prostate cancer (HCC) 10/26/2011   Coronary atherosclerosis 07/19/2010   GERD 07/19/2010   Hyperlipidemia 07/18/2010   Gout 07/18/2010   Hypertension 07/18/2010    ONSET DATE: 12/22/2022  REFERRING DIAG: I63.9 (ICD-10-CM) - Acute CVA (cerebrovascular accident) (HCC)  THERAPY DIAG:  Unsteadiness on feet  Hemiplegia and hemiparesis following cerebral infarction affecting left non-dominant side (HCC)  Muscle weakness (generalized)  Other abnormalities of gait and mobility  Rationale for Evaluation and Treatment: Rehabilitation  SUBJECTIVE:                                                                                                                                                                                             SUBJECTIVE  STATEMENT: Has been getting up walking more with the walker.  Reports today has been a better day. Has been monitoring BP at home. No falls. No dizziness right now.   Pt accompanied by:  [name]   PERTINENT HISTORY: Pt is 72 yo male who presented on 12/21/22 with intermittent L leg weakness past 3 weeks. 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 Pt discharged on 12/22/22. Pt with hx including but not limited to CAD, HTN, TIA, anxiety, BPPV, HLD, gout, prostate CA, CABG and stents.  Pt also hard of hearing.   Vitals:   02/26/23 1325 02/26/23 1345  BP: 98/64 94/64  Pulse: 74 76       PAIN:  Are you having pain? No  PRECAUTIONS: Fall  WEIGHT BEARING RESTRICTIONS: No  FALLS: Has patient fallen in last 6 months? Yes. Number of falls 5, denies injury, just some bruises   LIVING ENVIRONMENT: Lives with: lives with their spouse and has family closeby and grand son is always home  Lives in: House/apartment Stairs: Yes: Internal: 12 steps; on right going up, level entry into house  Has following equipment at home: Dan Humphreys - 2 wheeled, Environmental consultant - 4 wheeled, shower chair, and Uses standard 2 wheeled RW, uses rollator out in the community as he can sit in it.   PLOF: Independent and Leisure: enjoys biking and his riding his UTV  PATIENT GOALS: Wants to build more strength in L leg  OBJECTIVE:   DIAGNOSTIC FINDINGS: MRI brain wo contrast 12/21/2022: Intermediate sized area of acute ischemia within the right anterior cerebral artery territory. No hemorrhage or mass effect.   COGNITION: Overall cognitive status: Within functional limits for tasks assessed   SENSATION: Light touch: WFL  Reports some tingling in feet at night.   COORDINATION: Heel to shin: slightly slower with LLE   RAM with BUE: impaired coordination with LUE   POSTURE: rounded shoulders, forward head, and weight shift right  LOWER EXTREMITY ROM:     Decr LLE knee extension AROM due to weakness  LOWER EXTREMITY MMT:    MMT Right Eval  Left Eval  Hip flexion 5 4+  Hip extension    Hip abduction 5 5  Hip adduction 5 5  Hip internal rotation    Hip external rotation    Knee flexion 5 5  Knee extension 5 3  Ankle dorsiflexion 5 4  Ankle plantarflexion    Ankle inversion    Ankle eversion    (Blank rows = not tested)  All tested in sitting.    TODAY'S TREATMENT:                                                                                                                               TherAct BP assessed at beginning of session while seated and halfway during session while seated.  No signs/symptoms of OH during session. Pt also denying any dizziness  Vitals:   02/26/23 1325 02/26/23 1345  BP: 98/64 94/64  Pulse: 74 76    NMR:  Performed stand step transfer from w/c rollator to mat table with RW with supervision/CGA: When pt initially sitting down on the mat table, pt with incr lean to the L, needing min A to correct. Then pt able to maintain balance in sitting for remainder of session.  Standing at edge of mat table with chair in front of pt, standing and reaching towards left with RUE to grab bean bag from PT student volunteer and then tossing into crate, pt initially performing without UE support then needing support for balance, with min/mod A at times for balance as pt with incr lean to the L and then L knee would start to give out and then pt would need to sit down to the mat as once pt was leaning to the L, pt unable to correct. Performed multiple reps of standing and then needing intermittent seated rest breaks due to LLE  Seated with 10# kettlebell on R and L side performed RLE step overs laterally and back to middle for improved hip flexor strengthening/core activation and balance with no UE support, when performing with LLE, performed x5 reps initially without kettlebell obstacle due to weakness/impaired coordination, then performed an additional x10 reps stepping over kettlebell with pt needing UE support  on mat table for balance and pt heavily reliant on leaning to the R side  10 reps staggered stance sit <> stands with RLE posteriorly, performing initially with UE support and then performed with none with CGA for balance, trying to sit without UE support  Standing with BUE support on chair: stepping RLE out and in x10 reps for incr stance time on LLE, stepping LLE out and in x10 reps (pt only able to make it to rep 9 or 10 before LLE started giving out), then progressing difficulty to stepping over 2" obstacle x5 reps before LLE gave out and needing to sit back down and an additional x10 reps after a seated rest break. Pt during better the 2nd set.    PATIENT EDUCATION: Education details: continue to monitor BP and dizziness at home, continue HEP as able, continue to use RW AT ALL TIMES due to random onset of balance impairments and LLE giving out. When pt is having a better day, can also worked on staggered stance sit <> stands with LLE posteriorly with his RW and his spouse  Person educated: Patient and Spouse Education method: Explanation, Demonstration, and Handouts Education comprehension: verbalized understanding and needs further education  HOME EXERCISE PROGRAM: Access Code: [email or web address] URL: [email or web address] Date: 02/09/2023 Prepared by: Sherlie Ban  Program Notes Sitting at edge of mat table, with left leg tucked behind right, holding onto chair, gently lean forward and lift bottom off mat and hold for a few seconds and then slowly lower to the mat.   Exercises - Sit to Stand  - 1 x daily - 7 x weekly - 3 sets - 5 reps - with LLE staggered behind R  - Seated Long Arc Quad  - 1 x daily - 7 x weekly - 3 sets - 5 reps - 5 sec hold - Supine Bridge  - 1 x daily - 7 x weekly - 1-2 sets - 10 reps - Supine Hip Adduction Isometric with Ball  - 1 x daily - 7 x weekly - 1-2 sets - 10 reps - Standing Forward Step Taps with Counter Support  - 1 x  daily - 7 x weekly - 3 sets  - 10 reps - Mini Squat with Counter Support  - 1 x daily - 7 x weekly - 3 sets - 10 reps  GOALS: Goals reviewed with patient? Yes  SHORT TERM GOALS: Target date: 02/23/2023  Pt will be independent with initial HEP with family supervision in order to build upon functional gains made in therapy. Baseline: Goal status: MET  2.  Pt and pt's family will verbalize understanding on fall prevention in the home.  Baseline:  Goal status: IN PROGRESS  3.  Pt will improve Berg score to 6/56 for decreased fall risk Baseline: 2/56 (7/2), 12/56 (7/23) Goal status: MET  4. Pt will improve 5x sit<>stand to less than or equal to 20 sec with no UE support to demonstrate improved functional strength and transfer efficiency.  Baseline: 24.5 seconds with no UE support, 16.13 sec (7/23) Goal status: MET  5.  Pt will improve gait speed with RW to at least 1.2 ft/sec in order to demo decr fall risk.  Baseline: 44.22 seconds with RW = .74 ft/sec, pt needing one brief standing rest break due to LLE weakness and felt like LLE could buckle, 1.3 ft/sec with RW (7/23) Goal status: MET  6.  Pt will be able to stand for 5 minutes with supervision in order to improve standing tolerance for ADLs.  Baseline: Pt's spouse states pt can not stand for too long due to fatigue/weakness, pt unable to consistently stand for 5 min without LOB to the L  Goal status: IN PROGRESS  LONG TERM GOALS: Target date: 03/23/2023  Pt will be independent with final HEP with family supervision in order to build upon functional gains made in therapy. Baseline:  Goal status: INITIAL  2.  Pt will improve Berg score to 10/56 for decreased fall risk Baseline: 2/56 (7/2) Goal status: INITIAL  3.  Pt will improve gait speed with RW vs. LRAD to at least 1.8 ft/sec in order to demo improved household mobility/decr fall risk  Baseline: 44.22 seconds with RW = .74 ft/sec, pt needing one brief standing rest break due to LLE weakness and felt  like LLE could buckle Goal status: INITIAL  4.  Pt will ambulate at least 400' with LRAD and supervision (over indoor/outdoor paved surfaces) in order to demo improved household/community mobility.  Baseline:  24.5 seconds with no UE support Goal status: INITIAL  5.  Pt will improve 5x sit<>stand to less than or equal to 16 sec with no UE support to demonstrate improved functional strength and transfer efficiency.  Baseline:  Goal status: INITIAL    ASSESSMENT:  CLINICAL IMPRESSION: Pt's BP lower today, but still [facility] for therapy. Pt with no dizziness reported during session today, but just having episodes where his LLE would buckle and pt would need to sit down. Performed session in front of mat table with chair in front of pt because of unexpected LLE buckling. Once pt loses balance to the L in standing, pt unable to self correct and needing min/mod A with LLE buckling. This tends to happen unexpectedly. Pt continues to benefit from skilled therapy services to work towards increasing his safety and independence with functional mobility. Continue POC.    OBJECTIVE IMPAIRMENTS: Abnormal gait, decreased activity tolerance, decreased balance, decreased coordination, decreased endurance, decreased knowledge of use of DME, decreased mobility, difficulty walking, decreased ROM, decreased strength, and postural dysfunction.   ACTIVITY LIMITATIONS: carrying, lifting, bending, standing, stairs, transfers, and locomotion level  PARTICIPATION LIMITATIONS: cleaning, driving, shopping, community activity, and yard work  PERSONAL FACTORS: Age, Behavior pattern, Past/current experiences, Time since onset of injury/illness/exacerbation, and 3+ comorbidities: CAD, HTN, TIA, anxiety, BPPV, HLD, gout, prostate CA, CABG and stents  are also affecting patient's functional outcome.   REHAB POTENTIAL: Good  CLINICAL DECISION MAKING: Evolving/moderate complexity  EVALUATION COMPLEXITY:  Moderate  PLAN:  PT FREQUENCY: 2x/week  PT DURATION: 8 weeks  PLANNED INTERVENTIONS: Therapeutic exercises, Therapeutic activity, Neuromuscular re-education, Balance training, Gait training, Patient/Family education, Self Care, Joint mobilization, Stair training, Orthotic/Fit training, DME instructions, Aquatic Therapy, Manual therapy, and Re-evaluation  PLAN FOR NEXT SESSION: CHECK BP!!! Add to HEP as appropriate. Pt with LLE buckling and L lateral lean in unsupported sitting and standing. Gait with RW. Work on sitting balance/maintaining midline and adding safer exercises to HEP. SciFit, Blaze pods, SLS, LLE coordination, how are added standing exercises?, work on unsupported standing as safe and able   Sherlie Ban, PT, DPT 02/26/23 2:02 PM

## 2023-03-01 ENCOUNTER — Ambulatory Visit (INDEPENDENT_AMBULATORY_CARE_PROVIDER_SITE_OTHER): Payer: Medicare Other

## 2023-03-01 DIAGNOSIS — G459 Transient cerebral ischemic attack, unspecified: Secondary | ICD-10-CM | POA: Diagnosis not present

## 2023-03-02 ENCOUNTER — Encounter: Payer: Self-pay | Admitting: Physical Therapy

## 2023-03-02 ENCOUNTER — Ambulatory Visit: Payer: Medicare Other | Admitting: Physical Therapy

## 2023-03-02 VITALS — BP 104/68 | HR 72

## 2023-03-02 DIAGNOSIS — R2689 Other abnormalities of gait and mobility: Secondary | ICD-10-CM | POA: Diagnosis not present

## 2023-03-02 DIAGNOSIS — Z9181 History of falling: Secondary | ICD-10-CM | POA: Diagnosis not present

## 2023-03-02 DIAGNOSIS — I69354 Hemiplegia and hemiparesis following cerebral infarction affecting left non-dominant side: Secondary | ICD-10-CM | POA: Diagnosis not present

## 2023-03-02 DIAGNOSIS — I639 Cerebral infarction, unspecified: Secondary | ICD-10-CM | POA: Diagnosis not present

## 2023-03-02 DIAGNOSIS — M6281 Muscle weakness (generalized): Secondary | ICD-10-CM | POA: Diagnosis not present

## 2023-03-02 DIAGNOSIS — R2681 Unsteadiness on feet: Secondary | ICD-10-CM | POA: Diagnosis not present

## 2023-03-02 NOTE — Therapy (Signed)
OUTPATIENT PHYSICAL THERAPY NEURO TREATMENT   Patient Name: Tyler Guerra. MRN: [id number] DOB:June 07, 1951, 72 y.o., male Today's Date: 03/02/2023   PCP: Joaquim Nam, MD  REFERRING PROVIDER: Merlene Laughter, DO  END OF SESSION:  PT End of Session - 03/02/23 1325     Visit Number 9    Number of Visits 17    Date for PT Re-Evaluation 03/27/23    Authorization Type Medicare -Part A & B    PT Start Time 1323    PT Stop Time 1400    PT Time Calculation (min) 37 min    Equipment Utilized During Treatment Gait belt    Activity Tolerance Patient tolerated treatment well    Behavior During Therapy WFL for tasks assessed/performed                  Past Medical History:  Diagnosis Date   Anxiety state 03/16/2016   Aphasia 01/26/2017   Benign localized prostatic hyperplasia with lower urinary tract symptoms (LUTS)    BPV (benign positional vertigo) 11/02/2014   Carotid artery stenosis    PER DUPLEX 10-22-2015  BILATERAL ICA 1-39%   Cataract    Coronary artery disease    CABG 2000 with LIMA> LAD, SVG > RCA and free radial > OM. 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   Gout    Gout 07/18/2010   Qualifier: Diagnosis of  By: Para March MD, Cheree Ditto     H/O eye injury    chronic changes to left eye after injury   Hand weakness 03/02/2018   Hearing loss 04/21/2013   History of TIA (transient ischemic attack)    03/ 2017  no residual after brief episode loss peripheral vision   HOH (hard of hearing)    Hyperglycemia 05/17/2016   Hyperlipemia    Hyperlipidemia 07/18/2010   Qualifier: Diagnosis of  By: Para March MD, Graham     Hypertension    HYPERTENSION, BENIGN  ESSENTIAL 07/18/2010   Qualifier: Diagnosis of  By: Para March MD, Graham     Hypokalemia 01/26/2017   Knee pain 03/13/2019   Left shoulder pain 09/04/2018   Pneumothorax 10/11/2019   RIGHT    Prostate cancer (HCC) UROLOGIST-  DR GRAPEY/  ONCOLOGIST-  DR MANNING   dx 2015--- Stage T1c, Gleason 3+4, PSA 4.03, vol 44cc   PVC's (premature ventricular contractions)    Rash and nonspecific skin eruption 10/14/2015   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   S/P CABG x 05 Dec 1998   Skin lesion 10/14/2015   Stroke [facility])    TIA (transient ischemic attack) 10/14/2015   Ventral hernia 10/26/2011   Wears glasses    Past Surgical History:  Procedure Laterality Date   APPENDECTOMY  1975   CARDIAC CATHETERIZATION  12-22-2001   dr Verdis Prime   severe native vessel disease mLAD 60-70%,  total occulsion pCFX  and pRCA/  widely patent saphenous vein , free radial , and LIMA grafts/  minminal lv dysfunction, ef 60%   CHEST TUBE INSERTION Right 10/11/2019   CORONARY ARTERY BYPASS GRAFT  12/1998   LIMA to LAD,  SVG to PDA and Diagonal, Free radial graft  to OM   CORONARY STENT INTERVENTION N/A 05/26/2021   Procedure: CORONARY STENT INTERVENTION;  Surgeon: Orbie Pyo, MD;  Location: [facility];  Service: Cardiovascular;  Laterality: N/A;   Exericse treadmill test  last one 01-03-2014  dr Verdis Prime   normal exercise tolerance w/ hypertensive repsonse,  no ischemic EKG changes, appropriate HR response & recovery ([facility] TM score 9;  Low Risk , PVC's w/ exertion)   EYE SURGERY     FRACTURE SURGERY     LEFT HEART CATH AND CORS/GRAFTS ANGIOGRAPHY N/A 05/26/2021   Procedure: LEFT HEART CATH AND CORS/GRAFTS ANGIOGRAPHY;  Surgeon: Orbie Pyo, MD;  Location: [facility];  Service: Cardiovascular;  Laterality: N/A;   LOOP RECORDER INSERTION N/A 12/22/2022   Procedure: LOOP RECORDER INSERTION;  Surgeon: Marinus Maw, MD;  Location: [facility];  Service:  Cardiovascular;  Laterality: N/A;   PROSTATE BIOPSY     RADIOACTIVE SEED IMPLANT N/A 01/13/2016   Procedure: RADIOACTIVE SEED IMPLANT/BRACHYTHERAPY IMPLANT;  Surgeon: Barron Alvine, MD;  Location: [facility];  Service: Urology;  Laterality: N/A;   Patient Active Problem List   Diagnosis Date Noted   Acute CVA (cerebrovascular accident) (HCC) 12/22/2022   CVA (cerebral vascular accident) (HCC) 12/22/2022   Constipation 03/04/2022   CAD (coronary artery disease) 05/24/2021   NSTEMI (non-ST elevated myocardial infarction) (HCC) 05/24/2021   Pneumothorax 10/11/2019   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   Knee joint symptom 03/13/2019   Left shoulder pain 09/04/2018   Diabetes mellitus without complication (HCC) 03/02/2018   Anxiety state 03/16/2016   Pseudophakia of both eyes 02/24/2016   TIA (transient ischemic attack) 10/14/2015   BPV (benign positional vertigo) 11/02/2014   Advance care planning 05/13/2014   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   Hearing loss 04/21/2013   Ventral hernia 10/26/2011   Medicare annual wellness visit, subsequent 10/26/2011   Prostate cancer (HCC) 10/26/2011   Coronary atherosclerosis 07/19/2010   GERD 07/19/2010   Hyperlipidemia 07/18/2010   Gout 07/18/2010   Hypertension 07/18/2010    ONSET DATE: 12/22/2022  REFERRING DIAG: I63.9 (ICD-10-CM) - Acute CVA (cerebrovascular accident) (HCC)  THERAPY DIAG:  Unsteadiness on feet  Muscle weakness (generalized)  Hemiplegia and hemiparesis following cerebral infarction affecting left non-dominant side (HCC)  Other abnormalities of gait and mobility  Rationale for Evaluation and Treatment: Rehabilitation  SUBJECTIVE:                                                                                                                                                                                             SUBJECTIVE  STATEMENT: Did a lot of walking over the weekend. Sometimes not  walking at home with any AD. Reports L leg is feeling a little more tired today. No falls.   Pt accompanied by:  [name]   PERTINENT HISTORY: Pt is 72 yo male who presented on 12/21/22 with intermittent L leg weakness past 3 weeks. 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 Pt discharged on 12/22/22. Pt with hx including but not limited to CAD, HTN, TIA, anxiety, BPPV, HLD, gout, prostate CA, CABG and stents.  Pt also hard of hearing.   Vitals:   03/02/23 1335  BP: 104/68  Pulse: 72      PAIN:  Are you having pain? No  PRECAUTIONS: Fall  WEIGHT BEARING RESTRICTIONS: No  FALLS: Has patient fallen in last 6 months? Yes. Number of falls 5, denies injury, just some bruises   LIVING ENVIRONMENT: Lives with: lives with their spouse and has family closeby and grand son is always home  Lives in: House/apartment Stairs: Yes: Internal: 12 steps; on right going up, level entry into house  Has following equipment at home: Dan Humphreys - 2 wheeled, Environmental consultant - 4 wheeled, shower chair, and Uses standard 2 wheeled RW, uses rollator out in the community as he can sit in it.   PLOF: Independent and Leisure: enjoys biking and his riding his UTV  PATIENT GOALS: Wants to build more strength in L leg  OBJECTIVE:   DIAGNOSTIC FINDINGS: MRI brain wo contrast 12/21/2022: Intermediate sized area of acute ischemia within the right anterior cerebral artery territory. No hemorrhage or mass effect.   COGNITION: Overall cognitive status: Within functional limits for tasks assessed   SENSATION: Light touch: WFL  Reports some tingling in feet at night.   COORDINATION: Heel to shin: slightly slower with LLE   RAM with BUE: impaired coordination with LUE   POSTURE: rounded shoulders, forward head, and weight shift right  LOWER EXTREMITY ROM:     Decr LLE knee extension AROM due to weakness  LOWER EXTREMITY MMT:    MMT Right Eval Left Eval  Hip flexion 5 4+  Hip  extension    Hip abduction 5 5  Hip adduction 5 5  Hip internal rotation    Hip external rotation    Knee flexion 5 5  Knee extension 5 3  Ankle dorsiflexion 5 4  Ankle plantarflexion    Ankle inversion    Ankle eversion    (Blank rows = not tested)  All tested in sitting.    TODAY'S TREATMENT:                                                                                                                               BP assessed at beginning of session  No signs/symptoms of OH during session. Pt also denying any dizziness  Vitals:   03/02/23 1335  BP: 104/68  Pulse: 72  NMR/E-Stim Attended: Educated on Bioness and set pt on Tablet 1 with upper thigh cuff only to quads. Did not use for L ankle DF   Standing at RW: Forward and backwards stepping with RLE for incr stance time on LLE during on time for 15 seconds and rest during off time. Performed 6 reps total for pre-gait training    GAIT: Gait pattern: decreased step length- Right, decreased stance time- Left, decreased stride length, trunk flexed, and poor foot clearance- Left, L knee instability when fatigued  Distance walked: 38', 28', 49'  Assistive device utilized: Environmental consultant - 2 wheeled Level of assistance: CGA Comments: Performed with Bioness in gait mode for quad activation during stance. Pt able to walk farther distances with less knee instability. However, when pt gets more fatigued, does tend to buckle a little with seated rest break needed. Does need cues to stay closer to RW as pt tends to push it too far anteriorly.   with w/c follow, pt with L knee instability when fatigued    PATIENT EDUCATION: Education details: continue to monitor BP and dizziness at home, continue HEP as able, continue to use RW AT ALL TIMES due to random onset of balance impairments and LLE giving out. Pt should not be using no AD at home. Education about Bioness and purpose of Bioness for neuro re-ed and gait training  Person  educated: Patient and Spouse Education method: Explanation, Demonstration, and Handouts Education comprehension: verbalized understanding and needs further education  HOME EXERCISE PROGRAM: Access Code: [email or web address] URL: [email or web address] Date: 02/09/2023 Prepared by: Sherlie Ban  Program Notes Sitting at edge of mat table, with left leg tucked behind right, holding onto chair, gently lean forward and lift bottom off mat and hold for a few seconds and then slowly lower to the mat.   Exercises - Sit to Stand  - 1 x daily - 7 x weekly - 3 sets - 5 reps - with LLE staggered behind R  - Seated Long Arc Quad  - 1 x daily - 7 x weekly - 3 sets - 5 reps - 5 sec hold - Supine Bridge  - 1 x daily - 7 x weekly - 1-2 sets - 10 reps - Supine Hip Adduction Isometric with Ball  - 1 x daily - 7 x weekly - 1-2 sets - 10 reps - Standing Forward Step Taps with Counter Support  - 1 x daily - 7 x weekly - 3 sets - 10 reps - Mini Squat with Counter Support  - 1 x daily - 7 x weekly - 3 sets - 10 reps  GOALS: Goals reviewed with patient? Yes  SHORT TERM GOALS: Target date: 02/23/2023  Pt will be independent with initial HEP with family supervision in order to build upon functional gains made in therapy. Baseline: Goal status: MET  2.  Pt and pt's family will verbalize understanding on fall prevention in the home.  Baseline:  Goal status: IN PROGRESS  3.  Pt will improve Berg score to 6/56 for decreased fall risk Baseline: 2/56 (7/2), 12/56 (7/23) Goal status: MET  4. Pt will improve 5x sit<>stand to less than or equal to 20 sec with no UE support to demonstrate improved functional strength and transfer efficiency.  Baseline: 24.5 seconds with no UE support, 16.13 sec (7/23) Goal status: MET  5.  Pt will improve gait speed with RW to at least 1.2 ft/sec in order to demo decr fall risk.  Baseline: 44.22 seconds with RW = .  74 ft/sec, pt needing one brief standing rest break due to  LLE weakness and felt like LLE could buckle, 1.3 ft/sec with RW (7/23) Goal status: MET  6.  Pt will be able to stand for 5 minutes with supervision in order to improve standing tolerance for ADLs.  Baseline: Pt's spouse states pt can not stand for too long due to fatigue/weakness, pt unable to consistently stand for 5 min without LOB to the L  Goal status: IN PROGRESS  LONG TERM GOALS: Target date: 03/23/2023  Pt will be independent with final HEP with family supervision in order to build upon functional gains made in therapy. Baseline:  Goal status: INITIAL  2.  Pt will improve Berg score to 10/56 for decreased fall risk Baseline: 2/56 (7/2) Goal status: INITIAL  3.  Pt will improve gait speed with RW vs. LRAD to at least 1.8 ft/sec in order to demo improved household mobility/decr fall risk  Baseline: 44.22 seconds with RW = .74 ft/sec, pt needing one brief standing rest break due to LLE weakness and felt like LLE could buckle Goal status: INITIAL  4.  Pt will ambulate at least 400' with LRAD and supervision (over indoor/outdoor paved surfaces) in order to demo improved household/community mobility.  Baseline:  24.5 seconds with no UE support Goal status: INITIAL  5.  Pt will improve 5x sit<>stand to less than or equal to 16 sec with no UE support to demonstrate improved functional strength and transfer efficiency.  Baseline:  Goal status: INITIAL    ASSESSMENT:  CLINICAL IMPRESSION: Pt reporting not feeling any lightheadedness or dizziness today with BP WFL. Educated on continued use of RW at home as pt not walking with any AD at times. Discussed fall risk with this as LLE could give out. Initiated use of Bioness today to L quad for NMR/strengthening in stance and gait training to help with LLE knee buckling. With Bioness in gait mode, pt able to perform 3 bouts of ambulation with a seated rest break between each for a total of 115'. Pt demonstrating improved quad control with  Bioness, but does start to buckle a little with fatigue. Pt will benefit from continued use and tolerated well. Continue POC.    OBJECTIVE IMPAIRMENTS: Abnormal gait, decreased activity tolerance, decreased balance, decreased coordination, decreased endurance, decreased knowledge of use of DME, decreased mobility, difficulty walking, decreased ROM, decreased strength, and postural dysfunction.   ACTIVITY LIMITATIONS: carrying, lifting, bending, standing, stairs, transfers, and locomotion level  PARTICIPATION LIMITATIONS: cleaning, driving, shopping, community activity, and yard work  PERSONAL FACTORS: Age, Behavior pattern, Past/current experiences, Time since onset of injury/illness/exacerbation, and 3+ comorbidities: CAD, HTN, TIA, anxiety, BPPV, HLD, gout, prostate CA, CABG and stents  are also affecting patient's functional outcome.   REHAB POTENTIAL: Good  CLINICAL DECISION MAKING: Evolving/moderate complexity  EVALUATION COMPLEXITY: Moderate  PLAN:  PT FREQUENCY: 2x/week  PT DURATION: 8 weeks  PLANNED INTERVENTIONS: Therapeutic exercises, Therapeutic activity, Neuromuscular re-education, Balance training, Gait training, Patient/Family education, Self Care, Joint mobilization, Stair training, Orthotic/Fit training, DME instructions, Aquatic Therapy, Manual therapy, and Re-evaluation  PLAN FOR NEXT SESSION: WILL NEED 10TH VISIT PN!!!   CHECK BP!!! Add to HEP as appropriate. Pt with LLE buckling and L lateral lean in unsupported sitting and standing. Gait with RW. Work on sitting balance/maintaining midline and adding safer exercises to HEP. SciFit, Blaze pods, SLS, LLE coordination, how are added standing exercises?, work on unsupported standing as safe and able  Continue working with [employer]  Sherlie Ban, PT, DPT 03/02/23 3:56 PM

## 2023-03-05 ENCOUNTER — Encounter: Payer: Self-pay | Admitting: Physical Therapy

## 2023-03-05 ENCOUNTER — Ambulatory Visit: Payer: Medicare Other | Attending: Family Medicine | Admitting: Physical Therapy

## 2023-03-05 VITALS — BP 126/81 | HR 79

## 2023-03-05 DIAGNOSIS — R2681 Unsteadiness on feet: Secondary | ICD-10-CM | POA: Diagnosis not present

## 2023-03-05 DIAGNOSIS — I69354 Hemiplegia and hemiparesis following cerebral infarction affecting left non-dominant side: Secondary | ICD-10-CM | POA: Insufficient documentation

## 2023-03-05 DIAGNOSIS — R2689 Other abnormalities of gait and mobility: Secondary | ICD-10-CM | POA: Diagnosis not present

## 2023-03-05 DIAGNOSIS — M6281 Muscle weakness (generalized): Secondary | ICD-10-CM | POA: Diagnosis not present

## 2023-03-05 DIAGNOSIS — I639 Cerebral infarction, unspecified: Secondary | ICD-10-CM | POA: Insufficient documentation

## 2023-03-05 DIAGNOSIS — Z9181 History of falling: Secondary | ICD-10-CM | POA: Insufficient documentation

## 2023-03-05 NOTE — Therapy (Signed)
OUTPATIENT PHYSICAL THERAPY NEURO TREATMENT/10th VISIT PN   Patient Name: Tyler Guerra. MRN: [id number] DOB:[birth year], 72 y.o., male Today's Date: 03/05/2023   PCP: Joaquim Nam, MD  REFERRING PROVIDER: Merlene Laughter, DO  10th Visit Physical Therapy Progress Note  Dates of Reporting Period: 01/26/23 to 03/05/23     END OF SESSION:  PT End of Session - 03/05/23 1320     Visit Number 10    Number of Visits 17    Date for PT Re-Evaluation 03/27/23    Authorization Type Medicare -Part A & B    PT Start Time 1317    Equipment Utilized During Treatment Gait belt    Activity Tolerance Patient tolerated treatment well    Behavior During Therapy [facility] for tasks assessed/performed                  Past Medical History:  Diagnosis Date   Anxiety state 03/16/2016   Aphasia 01/26/2017   Benign localized prostatic hyperplasia with lower urinary tract symptoms (LUTS)    BPV (benign positional vertigo) 11/02/2014   Carotid artery stenosis    PER DUPLEX 10-22-2015  BILATERAL ICA 1-39%   Cataract    Coronary artery disease    CABG 2000 with LIMA> LAD, SVG > RCA and free radial > OM. 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   Gout    Gout 07/18/2010   Qualifier: Diagnosis of  By: Para March MD, Cheree Ditto     H/O eye injury    chronic changes to left eye after injury   Hand weakness 03/02/2018   Hearing loss 04/21/2013   History of TIA (transient ischemic attack)    03/ 2017  no residual after brief episode loss peripheral vision   HOH (hard of hearing)    Hyperglycemia 05/17/2016   Hyperlipemia    Hyperlipidemia 07/18/2010   Qualifier: Diagnosis of  By: Para March MD,  Graham     Hypertension    HYPERTENSION, BENIGN ESSENTIAL 07/18/2010   Qualifier: Diagnosis of  By: Para March MD, Graham     Hypokalemia 01/26/2017   Knee pain 03/13/2019   Left shoulder pain 09/04/2018   Pneumothorax 10/11/2019   RIGHT    Prostate cancer (HCC) UROLOGIST-  DR GRAPEY/  ONCOLOGIST-  DR MANNING   dx 2015--- Stage T1c, Gleason 3+4, PSA 4.03, vol 44cc   PVC's (premature ventricular contractions)    Rash and nonspecific skin eruption 10/14/2015   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   S/P CABG x 05 Dec 1998   Skin lesion 10/14/2015   Stroke [facility])    TIA (transient ischemic attack) 10/14/2015   Ventral hernia 10/26/2011   Wears glasses    Past Surgical History:  Procedure Laterality Date   APPENDECTOMY  1975   CARDIAC CATHETERIZATION  12-22-2001   dr Verdis Prime   severe native vessel disease mLAD 60-70%,  total occulsion pCFX  and pRCA/  widely patent saphenous vein , free radial , and LIMA grafts/  minminal lv dysfunction, ef 60%   CHEST TUBE INSERTION Right 10/11/2019   CORONARY ARTERY BYPASS GRAFT  12/1998   LIMA to LAD,  SVG to PDA and  Diagonal, Free radial graft to OM   CORONARY STENT INTERVENTION N/A 05/26/2021   Procedure: CORONARY STENT INTERVENTION;  Surgeon: Orbie Pyo, MD;  Location: [facility] INVASIVE CV LAB;  Service: Cardiovascular;  Laterality: N/A;   Exericse treadmill test  last one 01-03-2014  dr Verdis Prime   normal exercise tolerance w/ hypertensive repsonse,  no ischemic EKG changes, appropriate HR response & recovery ([facility] TM score 9;  Low Risk , PVC's w/ exertion)   EYE SURGERY     FRACTURE SURGERY     LEFT HEART CATH AND CORS/GRAFTS ANGIOGRAPHY N/A 05/26/2021   Procedure: LEFT HEART CATH AND CORS/GRAFTS ANGIOGRAPHY;  Surgeon: Orbie Pyo, MD;  Location: [facility] INVASIVE CV LAB;  Service: Cardiovascular;  Laterality: N/A;   LOOP RECORDER INSERTION N/A 12/22/2022   Procedure: LOOP RECORDER INSERTION;  Surgeon: Marinus Maw, MD;   Location: [facility] INVASIVE CV [facility];  Service: Cardiovascular;  Laterality: N/A;   PROSTATE BIOPSY     RADIOACTIVE SEED IMPLANT N/A 01/13/2016   Procedure: RADIOACTIVE SEED IMPLANT/BRACHYTHERAPY IMPLANT;  Surgeon: Barron Alvine, MD;  Location: [facility];  Service: Urology;  Laterality: N/A;   Patient Active Problem List   Diagnosis Date Noted   Acute CVA (cerebrovascular accident) (HCC) 12/22/2022   CVA (cerebral vascular accident) (HCC) 12/22/2022   Constipation 03/04/2022   CAD (coronary artery disease) 05/24/2021   NSTEMI (non-ST elevated myocardial infarction) (HCC) 05/24/2021   Pneumothorax 10/11/2019   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   Knee joint symptom 03/13/2019   Left shoulder pain 09/04/2018   Diabetes mellitus without complication (HCC) 03/02/2018   Anxiety state 03/16/2016   Pseudophakia of both eyes 02/24/2016   TIA (transient ischemic attack) 10/14/2015   BPV (benign positional vertigo) 11/02/2014   Advance care planning 05/13/2014   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   Hearing loss 04/21/2013   Ventral hernia 10/26/2011   Medicare annual wellness visit, subsequent 10/26/2011   Prostate cancer (HCC) 10/26/2011   Coronary atherosclerosis 07/19/2010   GERD 07/19/2010   Hyperlipidemia 07/18/2010   Gout 07/18/2010   Hypertension 07/18/2010    ONSET DATE: 12/22/2022  REFERRING DIAG: I63.9 (ICD-10-CM) - Acute CVA (cerebrovascular accident) (HCC)  THERAPY DIAG:  Unsteadiness on feet  Hemiplegia and hemiparesis following cerebral infarction affecting left non-dominant side (HCC)  Muscle weakness (generalized)  Other abnormalities of gait and mobility  Rationale for Evaluation and Treatment: Rehabilitation  SUBJECTIVE:  SUBJECTIVE STATEMENT: Having a good  day. Haven't taken any of his BP medication today. Ambulated into session today with rollator. No falls. Still walking with nothing at home.   Pt accompanied by:  [name]   PERTINENT HISTORY: Pt is 72 yo male who presented on 12/21/22 with intermittent L leg weakness past 3 weeks. 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 Pt discharged on 12/22/22. Pt with hx including but not limited to CAD, HTN, TIA, anxiety, BPPV, HLD, gout, prostate CA, CABG and stents.  Pt also hard of hearing.   Vitals:   03/05/23 1327 03/05/23 1351  BP: 114/71 126/81  Pulse: 75 79       PAIN:  Are you having pain? No  PRECAUTIONS: Fall  WEIGHT BEARING RESTRICTIONS: No  FALLS: Has patient fallen in last 6 months? Yes. Number of falls 5, denies injury, just some bruises   LIVING ENVIRONMENT: Lives with: lives with their spouse and has family closeby and grand son is always home  Lives in: House/apartment Stairs: Yes: Internal: 12 steps; on right going up, level entry into house  Has following equipment at home: Dan Humphreys - 2 wheeled, Environmental consultant - 4 wheeled, shower chair, and Uses standard 2 wheeled RW, uses rollator out in the community as he can sit in it.   PLOF: Independent and Leisure: enjoys biking and his riding his UTV  PATIENT GOALS: Wants to build more strength in L leg  OBJECTIVE:   DIAGNOSTIC FINDINGS: MRI brain wo contrast 12/21/2022: Intermediate sized area of acute ischemia within the right anterior cerebral artery territory. No hemorrhage or mass effect.   COGNITION: Overall cognitive status: Within functional limits for tasks assessed   SENSATION: Light touch: WFL  Reports some tingling in feet at night.   COORDINATION: Heel to shin: slightly slower with LLE   RAM with BUE: impaired coordination with LUE   POSTURE: rounded shoulders, forward head, and weight shift right  LOWER EXTREMITY ROM:     Decr LLE knee extension AROM due to  weakness  LOWER EXTREMITY MMT:    MMT Right Eval Left Eval  Hip flexion 5 4+  Hip extension    Hip abduction 5 5  Hip adduction 5 5  Hip internal rotation    Hip external rotation    Knee flexion 5 5  Knee extension 5 3  Ankle dorsiflexion 5 4  Ankle plantarflexion    Ankle inversion    Ankle eversion    (Blank rows = not tested)  All tested in sitting.    TODAY'S TREATMENT:                                                                                                                               BP assessed at beginning of session and halfway through. Pt with no dizziness or lightheadedness today.  Vitals:   03/05/23 1327 03/05/23 1351  BP: 114/71 126/81  Pulse: 75 79      NMR:  In // bars: Alternating SLS taps to 6" step, x15 reps each side, beginning with UE support > none, pt able to perform SLS on LLE with no UE support and no buckling  With feet together on level ground, ball toss x10 reps to 2nd PT, then air ex with feet apart x10 reps and then feet together x15 reps, pt with mild postural sway but did a good job keeping his balance with incr the speed of toss as well  10 reps sit <> stands on air ex with no UE support  On air ex: feet hip width distance EO x10 reps head turns (cues to look all the way to the L side), x10 reps head nods Alternating step up/up and down/down onto air ex, beginning with UE support > none, x12 reps each side alternating legs  Chair behind pt in bars in case pt's LLE gave out, but no episodes of that today   GAIT: Gait pattern: decreased step length- Right, decreased stance time- Left, decreased stride length, trunk flexed, and poor foot clearance- Left  Distance walked: 230' x 1, plus additional distances  Assistive device utilized: Environmental consultant - 2 wheeled Level of assistance: Supervision  Comments: Pt ambulated into clinic with rollator with supervision and pt using RW for activities during session. Pt with no episodes of L knee  buckling during gait today and no LOB or trunk lean to L.    PATIENT EDUCATION: Education details: Continue use of RW at home. Following up with PCP regarding pt's functional status when not on his BP meds as pt with significant improvement in therapy today with stable BP.  Person educated: Patient and Spouse Education method: Medical illustrator Education comprehension: verbalized understanding and needs further education  HOME EXERCISE PROGRAM: Access Code: [email or web address] URL: [email or web address] Date: 02/09/2023 Prepared by: Sherlie Ban  Program Notes Sitting at edge of mat table, with left leg tucked behind right, holding onto chair, gently lean forward and lift bottom off mat and hold for a few seconds and then slowly lower to the mat.   Exercises - Sit to Stand  - 1 x daily - 7 x weekly - 3 sets - 5 reps - with LLE staggered behind R  - Seated Long Arc Quad  - 1 x daily - 7 x weekly - 3 sets - 5 reps - 5 sec hold - Supine Bridge  - 1 x daily - 7 x weekly - 1-2 sets - 10 reps - Supine Hip Adduction Isometric with Ball  - 1 x daily - 7 x weekly - 1-2 sets - 10 reps - Standing Forward Step Taps with Counter Support  - 1 x daily - 7 x weekly - 3 sets - 10 reps - Mini Squat with Counter Support  - 1 x daily - 7 x weekly - 3 sets - 10 reps  GOALS: Goals reviewed with patient? Yes  SHORT TERM GOALS: Target date: 02/23/2023  Pt will be independent with initial HEP with family supervision in order to build upon functional gains made in therapy. Baseline: Goal status: MET  2.  Pt and pt's family will verbalize understanding on fall prevention in the home.  Baseline:  Goal status: IN PROGRESS  3.  Pt will improve Berg score to 6/56 for decreased fall risk Baseline: 2/56 (7/2), 12/56 (7/23) Goal status: MET  4. Pt will improve 5x sit<>stand to less than or equal to 20 sec with  no UE support to demonstrate improved functional strength and transfer  efficiency.  Baseline: 24.5 seconds with no UE support, 16.13 sec (7/23) Goal status: MET  5.  Pt will improve gait speed with RW to at least 1.2 ft/sec in order to demo decr fall risk.  Baseline: 44.22 seconds with RW = .74 ft/sec, pt needing one brief standing rest break due to LLE weakness and felt like LLE could buckle, 1.3 ft/sec with RW (7/23) Goal status: MET  6.  Pt will be able to stand for 5 minutes with supervision in order to improve standing tolerance for ADLs.  Baseline: Pt's spouse states pt can not stand for too long due to fatigue/weakness, pt unable to consistently stand for 5 min without LOB to the L  Goal status: IN PROGRESS  LONG TERM GOALS: Target date: 03/23/2023  Pt will be independent with final HEP with family supervision in order to build upon functional gains made in therapy. Baseline:  Goal status: INITIAL  2.  Pt will improve Berg score to 10/56 for decreased fall risk Baseline: 2/56 (7/2) Goal status: INITIAL  3.  Pt will improve gait speed with RW vs. LRAD to at least 1.8 ft/sec in order to demo improved household mobility/decr fall risk  Baseline: 44.22 seconds with RW = .74 ft/sec, pt needing one brief standing rest break due to LLE weakness and felt like LLE could buckle Goal status: INITIAL  4.  Pt will ambulate at least 400' with LRAD and supervision (over indoor/outdoor paved surfaces) in order to demo improved household/community mobility.  Baseline:  24.5 seconds with no UE support Goal status: INITIAL  5.  Pt will improve 5x sit<>stand to less than or equal to 16 sec with no UE support to demonstrate improved functional strength and transfer efficiency.  Baseline:  Goal status: INITIAL    ASSESSMENT:  CLINICAL IMPRESSION: 10th visit PN: Pt did not take his BP medication before therapy today as he normally ambulates and moves better before taking it. Pt's BP WNL at start of therapy and during therapy after activity. Pt also reporting no  episodes of lightheadedness/dizziness. Pt with significant improvement in gait and balance today. Pt able to ambulate 230' with RW and supervision with no episodes of L knee buckling. Previously pt unable to walk approx >50' without L knee wanting to give way. Pt also able to perform high level balance in // bars with SLS tasks and on compliant surfaces and no UE support with good balance reactions. In previous sessions, in standing pt had a trunk lean to the L that he was unable to correct. No trunk lean noted for today. Pt and pt's spouse to follow-up with PCP regarding medications. Will continue per POC.    OBJECTIVE IMPAIRMENTS: Abnormal gait, decreased activity tolerance, decreased balance, decreased coordination, decreased endurance, decreased knowledge of use of DME, decreased mobility, difficulty walking, decreased ROM, decreased strength, and postural dysfunction.   ACTIVITY LIMITATIONS: carrying, lifting, bending, standing, stairs, transfers, and locomotion level  PARTICIPATION LIMITATIONS: cleaning, driving, shopping, community activity, and yard work  PERSONAL FACTORS: Age, Behavior pattern, Past/current experiences, Time since onset of injury/illness/exacerbation, and 3+ comorbidities: CAD, HTN, TIA, anxiety, BPPV, HLD, gout, prostate CA, CABG and stents  are also affecting patient's functional outcome.   REHAB POTENTIAL: Good  CLINICAL DECISION MAKING: Evolving/moderate complexity  EVALUATION COMPLEXITY: Moderate  PLAN:  PT FREQUENCY: 2x/week  PT DURATION: 8 weeks  PLANNED INTERVENTIONS: Therapeutic exercises, Therapeutic activity, Neuromuscular re-education, Balance training, Gait training,  Patient/Family education, Self Care, Joint mobilization, Stair training, Orthotic/Fit training, DME instructions, Aquatic Therapy, Manual therapy, and Re-evaluation  PLAN FOR NEXT SESSION:   CHECK BP!!! Add to HEP as appropriate. Pt with LLE buckling and L lateral lean in unsupported  sitting and standing. Gait with RW. Work on sitting balance/maintaining midline and adding safer exercises to HEP. SciFit, Blaze pods, SLS, LLE coordination, how are added standing exercises?, work on unsupported standing as safe and able  Continue working with bioness    Sherlie Ban, PT, DPT 03/05/23 1:51 PM

## 2023-03-09 ENCOUNTER — Ambulatory Visit: Payer: Medicare Other | Admitting: Physical Therapy

## 2023-03-09 ENCOUNTER — Encounter: Payer: Self-pay | Admitting: Physical Therapy

## 2023-03-09 VITALS — BP 120/81 | HR 72

## 2023-03-09 DIAGNOSIS — I639 Cerebral infarction, unspecified: Secondary | ICD-10-CM | POA: Diagnosis not present

## 2023-03-09 DIAGNOSIS — I69354 Hemiplegia and hemiparesis following cerebral infarction affecting left non-dominant side: Secondary | ICD-10-CM | POA: Diagnosis not present

## 2023-03-09 DIAGNOSIS — R2681 Unsteadiness on feet: Secondary | ICD-10-CM | POA: Diagnosis not present

## 2023-03-09 DIAGNOSIS — M6281 Muscle weakness (generalized): Secondary | ICD-10-CM

## 2023-03-09 DIAGNOSIS — R2689 Other abnormalities of gait and mobility: Secondary | ICD-10-CM

## 2023-03-09 DIAGNOSIS — Z9181 History of falling: Secondary | ICD-10-CM | POA: Diagnosis not present

## 2023-03-09 NOTE — Therapy (Signed)
OUTPATIENT PHYSICAL THERAPY NEURO TREATMENT  Patient Name: Tyler Guerra. MRN: [id number] DOB:1951/04/24, 72 y.o., male Today's Date: 03/10/2023   PCP: Joaquim Nam, MD  REFERRING PROVIDER: Merlene Laughter, DO     END OF SESSION:  PT End of Session - 03/09/23 1448     Visit Number 11    Number of Visits 17    Date for PT Re-Evaluation 03/27/23    Authorization Type Medicare -Part A & B    PT Start Time 1446    PT Stop Time 1528    PT Time Calculation (min) 42 min    Equipment Utilized During Treatment Gait belt    Activity Tolerance Patient tolerated treatment well    Behavior During Therapy WFL for tasks assessed/performed                  Past Medical History:  Diagnosis Date   Anxiety state 03/16/2016   Aphasia 01/26/2017   Benign localized prostatic hyperplasia with lower urinary tract symptoms (LUTS)    BPV (benign positional vertigo) 11/02/2014   Carotid artery stenosis    PER DUPLEX 10-22-2015  BILATERAL ICA 1-39%   Cataract    Coronary artery disease    CABG 2000 with LIMA> LAD, SVG > RCA and free radial > OM. 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   Gout    Gout 07/18/2010   Qualifier: Diagnosis of  By: Para March MD, Cheree Ditto     H/O eye injury    chronic changes to left eye after injury   Hand weakness 03/02/2018   Hearing loss 04/21/2013   History of TIA (transient ischemic attack)    03/ 2017  no residual after brief episode loss peripheral vision   HOH (hard of hearing)    Hyperglycemia 05/17/2016   Hyperlipemia    Hyperlipidemia 07/18/2010   Qualifier: Diagnosis of  By: Para March MD, Graham     Hypertension    HYPERTENSION, BENIGN  ESSENTIAL 07/18/2010   Qualifier: Diagnosis of  By: Para March MD, Graham     Hypokalemia 01/26/2017   Knee pain 03/13/2019   Left shoulder pain 09/04/2018   Pneumothorax 10/11/2019   RIGHT    Prostate cancer (HCC) UROLOGIST-  DR GRAPEY/  ONCOLOGIST-  DR MANNING   dx 2015--- Stage T1c, Gleason 3+4, PSA 4.03, vol 44cc   PVC's (premature ventricular contractions)    Rash and nonspecific skin eruption 10/14/2015   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   S/P CABG x 05 Dec 1998   Skin lesion 10/14/2015   Stroke [facility])    TIA (transient ischemic attack) 10/14/2015   Ventral hernia 10/26/2011   Wears glasses    Past Surgical History:  Procedure Laterality Date   APPENDECTOMY  1975   CARDIAC CATHETERIZATION  12-22-2001   dr Verdis Prime   severe native vessel disease mLAD 60-70%,  total occulsion pCFX  and pRCA/  widely patent saphenous vein , free radial , and LIMA grafts/  minminal lv dysfunction, ef 60%   CHEST TUBE INSERTION Right 10/11/2019   CORONARY ARTERY BYPASS GRAFT  12/1998   LIMA to LAD,  SVG to PDA and Diagonal, Free  radial graft to OM   CORONARY STENT INTERVENTION N/A 05/26/2021   Procedure: CORONARY STENT INTERVENTION;  Surgeon: Orbie Pyo, MD;  Location: [facility];  Service: Cardiovascular;  Laterality: N/A;   Exericse treadmill test  last one 01-03-2014  dr Verdis Prime   normal exercise tolerance w/ hypertensive repsonse,  no ischemic EKG changes, appropriate HR response & recovery ([facility] TM score 9;  Low Risk , PVC's w/ exertion)   EYE SURGERY     FRACTURE SURGERY     LEFT HEART CATH AND CORS/GRAFTS ANGIOGRAPHY N/A 05/26/2021   Procedure: LEFT HEART CATH AND CORS/GRAFTS ANGIOGRAPHY;  Surgeon: Orbie Pyo, MD;  Location: [facility];  Service: Cardiovascular;  Laterality: N/A;   LOOP RECORDER INSERTION N/A 12/22/2022   Procedure: LOOP RECORDER INSERTION;  Surgeon: Marinus Maw, MD;  Location: [facility];  Service:  Cardiovascular;  Laterality: N/A;   PROSTATE BIOPSY     RADIOACTIVE SEED IMPLANT N/A 01/13/2016   Procedure: RADIOACTIVE SEED IMPLANT/BRACHYTHERAPY IMPLANT;  Surgeon: Barron Alvine, MD;  Location[facility];  Service: Urology;  Laterality: N/A;   Patient Active Problem List   Diagnosis Date Noted   Acute CVA (cerebrovascular accident) (HCC) 12/22/2022   CVA (cerebral vascular accident) (HCC) 12/22/2022   Constipation 03/04/2022   CAD (coronary artery disease) 05/24/2021   NSTEMI (non-ST elevated myocardial infarction) (HCC) 05/24/2021   Pneumothorax 10/11/2019   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   Knee joint symptom 03/13/2019   Left shoulder pain 09/04/2018   Diabetes mellitus without complication (HCC) 03/02/2018   Anxiety state 03/16/2016   Pseudophakia of both eyes 02/24/2016   TIA (transient ischemic attack) 10/14/2015   BPV (benign positional vertigo) 11/02/2014   Advance care planning 05/13/2014   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   Hearing loss 04/21/2013   Ventral hernia 10/26/2011   Medicare annual wellness visit, subsequent 10/26/2011   Prostate cancer (HCC) 10/26/2011   Coronary atherosclerosis 07/19/2010   GERD 07/19/2010   Hyperlipidemia 07/18/2010   Gout 07/18/2010   Hypertension 07/18/2010    ONSET DATE: 12/22/2022  REFERRING DIAG: I63.9 (ICD-10-CM) - Acute CVA (cerebrovascular accident) (HCC)  THERAPY DIAG:  Unsteadiness on feet  Muscle weakness (generalized)  Other abnormalities of gait and mobility  Hemiplegia and hemiparesis following cerebral infarction affecting left non-dominant side (HCC)  Rationale for Evaluation and Treatment: Rehabilitation  SUBJECTIVE:  SUBJECTIVE STATEMENT: Having a "wishy washy" day. Walked out to the car and  took a shower this morning. Was wheeled into with his rollator today due to it being a longer distance.   Pt accompanied by:  [name]   PERTINENT HISTORY: Pt is 72 yo male who presented on 12/21/22 with intermittent L leg weakness past 3 weeks. 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 Pt discharged on 12/22/22. Pt with hx including but not limited to CAD, HTN, TIA, anxiety, BPPV, HLD, gout, prostate CA, CABG and stents.  Pt also hard of hearing.   Vitals:   03/09/23 1456  BP: 120/81  Pulse: 72        PAIN:  Are you having pain? No  PRECAUTIONS: Fall  WEIGHT BEARING RESTRICTIONS: No  FALLS: Has patient fallen in last 6 months? Yes. Number of falls 5, denies injury, just some bruises   LIVING ENVIRONMENT: Lives with: lives with their spouse and has family closeby and grand son is always home  Lives in: House/apartment Stairs: Yes: Internal: 12 steps; on right going up, level entry into house  Has following equipment at home: Dan Humphreys - 2 wheeled, Environmental consultant - 4 wheeled, shower chair, and Uses standard 2 wheeled RW, uses rollator out in the community as he can sit in it.   PLOF: Independent and Leisure: enjoys biking and his riding his UTV  PATIENT GOALS: Wants to build more strength in L leg  OBJECTIVE:   DIAGNOSTIC FINDINGS: MRI brain wo contrast 12/21/2022: Intermediate sized area of acute ischemia within the right anterior cerebral artery territory. No hemorrhage or mass effect.   COGNITION: Overall cognitive status: Within functional limits for tasks assessed   SENSATION: Light touch: WFL  Reports some tingling in feet at night.   COORDINATION: Heel to shin: slightly slower with LLE   RAM with BUE: impaired coordination with LUE   POSTURE: rounded shoulders, forward head, and weight shift right  LOWER EXTREMITY ROM:     Decr LLE knee extension AROM due to weakness  LOWER EXTREMITY MMT:    MMT Right Eval Left Eval  Hip  flexion 5 4+  Hip extension    Hip abduction 5 5  Hip adduction 5 5  Hip internal rotation    Hip external rotation    Knee flexion 5 5  Knee extension 5 3  Ankle dorsiflexion 5 4  Ankle plantarflexion    Ankle inversion    Ankle eversion    (Blank rows = not tested)  All tested in sitting.    TODAY'S TREATMENT:                                                                                                                               BP assessed at beginning of session and halfway through. Pt with no dizziness or lightheadedness today.  Vitals:   03/09/23 1456  BP: 120/81  Pulse: 72  NMR/E-Stim Attended: Pt on Tablet 1 with upper thigh cuff only to quads. Did not use for L ankle DF    Standing at RW with Bioness on activity mode With LLE as stance leg, tapping RLE to 6" step during on times for 10 seconds for quad activation during stance, rest during 8 second off time, performed 5 reps. No bucklign noted  With no UE support: mini squats for quad activation with no UE support during on times of 10 seconds and rest for 8 seconds, performed 5 sets total   In // bars: On gait mode: forwards/retro gait with no UE support, down and back x3 reps, then with adding in head turns x2 reps, adding in head nods x2 reps with forward gait, some unsteadiness with added head turns  On activity mode: Side stepping down and back x2 reps, then adding in ball toss with 2nd PT for manual dual tasking while side stepping down and back x3 reps, incr speed when tossing ball      GAIT: Gait pattern: decreased step length- Right, decreased stance time- Left, decreased stride length, trunk flexed, and poor foot clearance- Left, L knee instability when fatigued  Distance walked: 230' x 1, 115' x 1, plus additional distances during session  Assistive device utilized: Environmental consultant - 2 wheeled Level of assistance: Supervision  Comments: Performed with Bioness in gait mode for quad activation during  stance. Pt with no episodes of L knee instability today. Pt also subjectively reports feeling more confidence    PATIENT EDUCATION: Education details: Continue use of RW at home. Continue following up with PCP regarding BP  Person educated: Patient and Spouse Education method: Explanation and Demonstration Education comprehension: verbalized understanding and needs further education  HOME EXERCISE PROGRAM: Access Code: [email or web address] URL: [email or web address] Date: 02/09/2023 Prepared by: Sherlie Ban  Program Notes Sitting at edge of mat table, with left leg tucked behind right, holding onto chair, gently lean forward and lift bottom off mat and hold for a few seconds and then slowly lower to the mat.   Exercises - Sit to Stand  - 1 x daily - 7 x weekly - 3 sets - 5 reps - with LLE staggered behind R  - Seated Long Arc Quad  - 1 x daily - 7 x weekly - 3 sets - 5 reps - 5 sec hold - Supine Bridge  - 1 x daily - 7 x weekly - 1-2 sets - 10 reps - Supine Hip Adduction Isometric with Ball  - 1 x daily - 7 x weekly - 1-2 sets - 10 reps - Standing Forward Step Taps with Counter Support  - 1 x daily - 7 x weekly - 3 sets - 10 reps - Mini Squat with Counter Support  - 1 x daily - 7 x weekly - 3 sets - 10 reps  GOALS: Goals reviewed with patient? Yes  SHORT TERM GOALS: Target date: 02/23/2023  Pt will be independent with initial HEP with family supervision in order to build upon functional gains made in therapy. Baseline: Goal status: MET  2.  Pt and pt's family will verbalize understanding on fall prevention in the home.  Baseline:  Goal status: IN PROGRESS  3.  Pt will improve Berg score to 6/56 for decreased fall risk Baseline: 2/56 (7/2), 12/56 (7/23) Goal status: MET  4. Pt will improve 5x sit<>stand to less than or equal to 20 sec with no UE support to demonstrate improved functional strength and transfer efficiency.  Baseline: 24.5 seconds with no UE support,  16.13 sec (7/23) Goal status: MET  5.  Pt will improve gait speed with RW to at least 1.2 ft/sec in order to demo decr fall risk.  Baseline: 44.22 seconds with RW = .74 ft/sec, pt needing one brief standing rest break due to LLE weakness and felt like LLE could buckle, 1.3 ft/sec with RW (7/23) Goal status: MET  6.  Pt will be able to stand for 5 minutes with supervision in order to improve standing tolerance for ADLs.  Baseline: Pt's spouse states pt can not stand for too long due to fatigue/weakness, pt unable to consistently stand for 5 min without LOB to the L  Goal status: IN PROGRESS  LONG TERM GOALS: Target date: 03/23/2023  Pt will be independent with final HEP with family supervision in order to build upon functional gains made in therapy. Baseline:  Goal status: INITIAL  2.  Pt will improve Berg score to 10/56 for decreased fall risk Baseline: 2/56 (7/2) Goal status: INITIAL  3.  Pt will improve gait speed with RW vs. LRAD to at least 1.8 ft/sec in order to demo improved household mobility/decr fall risk  Baseline: 44.22 seconds with RW = .74 ft/sec, pt needing one brief standing rest break due to LLE weakness and felt like LLE could buckle Goal status: INITIAL  4.  Pt will ambulate at least 400' with LRAD and supervision (over indoor/outdoor paved surfaces) in order to demo improved household/community mobility.  Baseline:  24.5 seconds with no UE support Goal status: INITIAL  5.  Pt will improve 5x sit<>stand to less than or equal to 16 sec with no UE support to demonstrate improved functional strength and transfer efficiency.  Baseline:  Goal status: INITIAL    ASSESSMENT:  CLINICAL IMPRESSION: Pt's BP WFL for therapy today and pt reporting no dizziness or lightheadedness during session today. Continued to utilize Bioness today to L quad for gait tasks and NMR for strengthening. Pt able to walk 345' + with RW during session with Bioness to L quad and pt with no  episodes of knee buckling. Also worked on balance tasks working on [employer] support, with pt with no episodes of buckling or leaning today. Will continue per POC.    OBJECTIVE IMPAIRMENTS: Abnormal gait, decreased activity tolerance, decreased balance, decreased coordination, decreased endurance, decreased knowledge of use of DME, decreased mobility, difficulty walking, decreased ROM, decreased strength, and postural dysfunction.   ACTIVITY LIMITATIONS: carrying, lifting, bending, standing, stairs, transfers, and locomotion level  PARTICIPATION LIMITATIONS: cleaning, driving, shopping, community activity, and yard work  PERSONAL FACTORS: Age, Behavior pattern, Past/current experiences, Time since onset of injury/illness/exacerbation, and 3+ comorbidities: CAD, HTN, TIA, anxiety, BPPV, HLD, gout, prostate CA, CABG and stents  are also affecting patient's functional outcome.   REHAB POTENTIAL: Good  CLINICAL DECISION MAKING: Evolving/moderate complexity  EVALUATION COMPLEXITY: Moderate  PLAN:  PT FREQUENCY: 2x/week  PT DURATION: 8 weeks  PLANNED INTERVENTIONS: Therapeutic exercises, Therapeutic activity, Neuromuscular re-education, Balance training, Gait training, Patient/Family education, Self Care, Joint mobilization, Stair training, Orthotic/Fit training, DME instructions, Aquatic Therapy, Manual therapy, and Re-evaluation  PLAN FOR NEXT SESSION:  CHECK BP!!! Add to HEP as appropriate. Gait with RW, once knee continues to stay more stable and balance is better can work towards a cane? Work on [employer], standing balance, functional strength, SLS activities.   Continue working with bioness to L quad.   Sherlie Ban, PT, DPT 03/10/23 10:01 AM

## 2023-03-12 ENCOUNTER — Encounter: Payer: Self-pay | Admitting: Physical Therapy

## 2023-03-12 ENCOUNTER — Ambulatory Visit: Payer: Medicare Other | Admitting: Physical Therapy

## 2023-03-12 VITALS — BP 117/85 | HR 81

## 2023-03-12 DIAGNOSIS — Z9181 History of falling: Secondary | ICD-10-CM | POA: Diagnosis not present

## 2023-03-12 DIAGNOSIS — I639 Cerebral infarction, unspecified: Secondary | ICD-10-CM | POA: Diagnosis not present

## 2023-03-12 DIAGNOSIS — R2681 Unsteadiness on feet: Secondary | ICD-10-CM

## 2023-03-12 DIAGNOSIS — M6281 Muscle weakness (generalized): Secondary | ICD-10-CM | POA: Diagnosis not present

## 2023-03-12 DIAGNOSIS — R2689 Other abnormalities of gait and mobility: Secondary | ICD-10-CM

## 2023-03-12 DIAGNOSIS — I69354 Hemiplegia and hemiparesis following cerebral infarction affecting left non-dominant side: Secondary | ICD-10-CM | POA: Diagnosis not present

## 2023-03-12 NOTE — Therapy (Signed)
OUTPATIENT PHYSICAL THERAPY NEURO TREATMENT  Patient Name: Tyler Guerra. MRN: [id number] DOB:[birth year], 72 y.o., male Today's Date: 03/12/2023   PCP: Joaquim Nam, MD  REFERRING PROVIDER: Merlene Laughter, DO     END OF SESSION:  PT End of Session - 03/12/23 1315     Visit Number 12    Number of Visits 17    Date for PT Re-Evaluation 03/27/23    Authorization Type Medicare -Part A & B    PT Start Time 1313    PT Stop Time 1400    PT Time Calculation (min) 47 min    Equipment Utilized During Treatment Gait belt    Activity Tolerance Patient tolerated treatment well    Behavior During Therapy [facility] for tasks assessed/performed                  Past Medical History:  Diagnosis Date   Anxiety state 03/16/2016   Aphasia 01/26/2017   Benign localized prostatic hyperplasia with lower urinary tract symptoms (LUTS)    BPV (benign positional vertigo) 11/02/2014   Carotid artery stenosis    PER DUPLEX 10-22-2015  BILATERAL ICA 1-39%   Cataract    Coronary artery disease    CABG 2000 with LIMA> LAD, SVG > RCA and free radial > OM. 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   Gout    Gout 07/18/2010   Qualifier: Diagnosis of  By: Para March MD, Cheree Ditto     H/O eye injury    chronic changes to left eye after injury   Hand weakness 03/02/2018   Hearing loss 04/21/2013   History of TIA (transient ischemic attack)    03/ 2017  no residual after brief episode loss peripheral vision   HOH (hard of hearing)    Hyperglycemia 05/17/2016   Hyperlipemia    Hyperlipidemia 07/18/2010   Qualifier: Diagnosis of  By: Para March MD, Graham     Hypertension    HYPERTENSION, BENIGN  ESSENTIAL 07/18/2010   Qualifier: Diagnosis of  By: Para March MD, Graham     Hypokalemia 01/26/2017   Knee pain 03/13/2019   Left shoulder pain 09/04/2018   Pneumothorax 10/11/2019   RIGHT    Prostate cancer (HCC) UROLOGIST-  DR GRAPEY/  ONCOLOGIST-  DR MANNING   dx 2015--- Stage T1c, Gleason 3+4, PSA 4.03, vol 44cc   PVC's (premature ventricular contractions)    Rash and nonspecific skin eruption 10/14/2015   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   S/P CABG x 05 Dec 1998   Skin lesion 10/14/2015   Stroke [facility])    TIA (transient ischemic attack) 10/14/2015   Ventral hernia 10/26/2011   Wears glasses    Past Surgical History:  Procedure Laterality Date   APPENDECTOMY  1975   CARDIAC CATHETERIZATION  12-22-2001   dr Verdis Prime   severe native vessel disease mLAD 60-70%,  total occulsion pCFX  and pRCA/  widely patent saphenous vein , free radial , and LIMA grafts/  minminal lv dysfunction, ef 60%   CHEST TUBE INSERTION Right 10/11/2019   CORONARY ARTERY BYPASS GRAFT  12/1998   LIMA to LAD,  SVG to PDA and Diagonal, Free  radial graft to OM   CORONARY STENT INTERVENTION N/A 05/26/2021   Procedure: CORONARY STENT INTERVENTION;  Surgeon: Orbie Pyo, MD;  Location: [facility] INVASIVE CV LAB;  Service: Cardiovascular;  Laterality: N/A;   Exericse treadmill test  last one 01-03-2014  dr Verdis Prime   normal exercise tolerance w/ hypertensive repsonse,  no ischemic EKG changes, appropriate HR response & recovery ([facility] TM score 9;  Low Risk , PVC's w/ exertion)   EYE SURGERY     FRACTURE SURGERY     LEFT HEART CATH AND CORS/GRAFTS ANGIOGRAPHY N/A 05/26/2021   Procedure: LEFT HEART CATH AND CORS/GRAFTS ANGIOGRAPHY;  Surgeon: Orbie Pyo, MD;  Location: [facility] INVASIVE CV LAB;  Service: Cardiovascular;  Laterality: N/A;   LOOP RECORDER INSERTION N/A 12/22/2022   Procedure: LOOP RECORDER INSERTION;  Surgeon: Marinus Maw, MD;  Location: [facility] INVASIVE CV LAB;  Service:  Cardiovascular;  Laterality: N/A;   PROSTATE BIOPSY     RADIOACTIVE SEED IMPLANT N/A 01/13/2016   Procedure: RADIOACTIVE SEED IMPLANT/BRACHYTHERAPY IMPLANT;  Surgeon: Barron Alvine, MD;  Location: [facility];  Service: Urology;  Laterality: N/A;   Patient Active Problem List   Diagnosis Date Noted   Acute CVA (cerebrovascular accident) (HCC) 12/22/2022   CVA (cerebral vascular accident) (HCC) 12/22/2022   Constipation 03/04/2022   CAD (coronary artery disease) 05/24/2021   NSTEMI (non-ST elevated myocardial infarction) (HCC) 05/24/2021   Pneumothorax 10/11/2019   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   Knee joint symptom 03/13/2019   Left shoulder pain 09/04/2018   Diabetes mellitus without complication (HCC) 03/02/2018   Anxiety state 03/16/2016   Pseudophakia of both eyes 02/24/2016   TIA (transient ischemic attack) 10/14/2015   BPV (benign positional vertigo) 11/02/2014   Advance care planning 05/13/2014   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   Hearing loss 04/21/2013   Ventral hernia 10/26/2011   Medicare annual wellness visit, subsequent 10/26/2011   Prostate cancer (HCC) 10/26/2011   Coronary atherosclerosis 07/19/2010   GERD 07/19/2010   Hyperlipidemia 07/18/2010   Gout 07/18/2010   Hypertension 07/18/2010    ONSET DATE: 12/22/2022  REFERRING DIAG: I63.9 (ICD-10-CM) - Acute CVA (cerebrovascular accident) (HCC)  THERAPY DIAG:  Unsteadiness on feet  Muscle weakness (generalized)  Other abnormalities of gait and mobility  Hemiplegia and hemiparesis following cerebral infarction affecting left non-dominant side (HCC)  History of falling  Rationale for Evaluation and Treatment: Rehabilitation  SUBJECTIVE:  SUBJECTIVE STATEMENT: Patient reports that he is  feeling about around a 7/10 today (10 being feeling perfect). Patient denies falls/near falls and changes to medications.   Pt accompanied by:  [name]   PERTINENT HISTORY: Pt is 73 yo male who presented on 12/21/22 with intermittent L leg weakness past 3 weeks. 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 Pt discharged on 12/22/22. Pt with hx including but not limited to CAD, HTN, TIA, anxiety, BPPV, HLD, gout, prostate CA, CABG and stents.  Pt also hard of hearing.   Vitals:   03/12/23 1321 03/12/23 1344  BP: 105/72 117/85  Pulse: 75 81    PAIN:  Are you having pain? No  PRECAUTIONS: Fall  WEIGHT BEARING RESTRICTIONS: No  FALLS: Has patient fallen in last 6 months? Yes. Number of falls 5, denies injury, just some bruises   LIVING ENVIRONMENT: Lives with: lives with their spouse and has family closeby and grand son is always home  Lives in: House/apartment Stairs: Yes: Internal: 12 steps; on right going up, level entry into house  Has following equipment at home: Dan Humphreys - 2 wheeled, Environmental consultant - 4 wheeled, shower chair, and Uses standard 2 wheeled RW, uses rollator out in the community as he can sit in it.   PLOF: Independent and Leisure: enjoys biking and his riding his UTV  PATIENT GOALS: Wants to build more strength in L leg  OBJECTIVE:   DIAGNOSTIC FINDINGS: MRI brain wo contrast 12/21/2022: Intermediate sized area of acute ischemia within the right anterior cerebral artery territory. No hemorrhage or mass effect.   COGNITION: Overall cognitive status: Within functional limits for tasks assessed   SENSATION: Light touch: WFL  Reports some tingling in feet at night.   COORDINATION: Heel to shin: slightly slower with LLE   RAM with BUE: impaired coordination with LUE   POSTURE: rounded shoulders, forward head, and weight shift right  LOWER EXTREMITY ROM:     Decr LLE knee extension AROM due to weakness  LOWER EXTREMITY MMT:     MMT Right Eval Left Eval  Hip flexion 5 4+  Hip extension    Hip abduction 5 5  Hip adduction 5 5  Hip internal rotation    Hip external rotation    Knee flexion 5 5  Knee extension 5 3  Ankle dorsiflexion 5 4  Ankle plantarflexion    Ankle inversion    Ankle eversion    (Blank rows = not tested)  All tested in sitting.    TODAY'S TREATMENT:                                                                                                                               BP assessed at beginning of session and halfway through. Pt with no dizziness or lightheadedness today.  Vitals:   03/12/23 1321 03/12/23 1344  BP: 105/72 117/85  Pulse: 75 81   NMR/E-Stim  Attended:  Pt on Tablet 1 with upper thigh cuff only to quads. Did not use for L ankle DF.  TherAct: Bioness set in training mode with 12 second activation time and 8 second relaxation time Without UE support step tap to steps 2 x 10 bil (tap to second step, up to third, down to second on LLE stance for quad activation, on RLE for LLE knee/hip flexion for LE clearance)  NMR: GAIT: Gait pattern: decreased step length- Right, decreased stance time- Left, decreased stride length, trunk flexed, and poor foot clearance- Left, L knee instability when fatigued  Distance walked: 115' x 1 with 2WW, 1 x 115' forward without AD, 1 x 80 feet backward without AD, 1 x 60' without AD  Level of assistance: Supervision-CGA Comments: Performed with Bioness in gait mode for quad activation during stance. Pt demonstrates increased difficulty with backwards stepping particularly with stride length of LLE   Gait pattern: decreased step length- Right, decreased stance time- Left, decreased stride length, trunk flexed, and poor foot clearance- Left Distance walked: 115' x 1 with SPC Level of assistance: SPC Comments: Bioness doffed to simulate home setup  RAMP:  Level of Assistance: CGA Assistive device utilized: Single point cane Ramp  Comments: appropriate pacing and control   CURB:  Level of Assistance: Min A Assistive device utilized: Single point cane Curb Comments: requires cuing for sequencing, increased instability noted on step down, will continue to step down  PATIENT EDUCATION: Education details: Continue HEP  Person educated: Patient and Spouse Education method: Medical illustrator Education comprehension: verbalized understanding and needs further education  HOME EXERCISE PROGRAM: Access Code: [email or web address] URL: [email or web address] Date: 02/09/2023 Prepared by: Sherlie Ban  Program Notes Sitting at edge of mat table, with left leg tucked behind right, holding onto chair, gently lean forward and lift bottom off mat and hold for a few seconds and then slowly lower to the mat.   Exercises - Sit to Stand  - 1 x daily - 7 x weekly - 3 sets - 5 reps - with LLE staggered behind R  - Seated Long Arc Quad  - 1 x daily - 7 x weekly - 3 sets - 5 reps - 5 sec hold - Supine Bridge  - 1 x daily - 7 x weekly - 1-2 sets - 10 reps - Supine Hip Adduction Isometric with Ball  - 1 x daily - 7 x weekly - 1-2 sets - 10 reps - Standing Forward Step Taps with Counter Support  - 1 x daily - 7 x weekly - 3 sets - 10 reps - Mini Squat with Counter Support  - 1 x daily - 7 x weekly - 3 sets - 10 reps  GOALS: Goals reviewed with patient? Yes  SHORT TERM GOALS: Target date: 02/23/2023  Pt will be independent with initial HEP with family supervision in order to build upon functional gains made in therapy. Baseline: Goal status: MET  2.  Pt and pt's family will verbalize understanding on fall prevention in the home.  Baseline:  Goal status: IN PROGRESS  3.  Pt will improve Berg score to 6/56 for decreased fall risk Baseline: 2/56 (7/2), 12/56 (7/23) Goal status: MET  4. Pt will improve 5x sit<>stand to less than or equal to 20 sec with no UE support to demonstrate improved functional strength and  transfer efficiency.  Baseline: 24.5 seconds with no UE support, 16.13 sec (7/23) Goal status: MET  5.  Pt will improve gait speed  with RW to at least 1.2 ft/sec in order to demo decr fall risk.  Baseline: 44.22 seconds with RW = .74 ft/sec, pt needing one brief standing rest break due to LLE weakness and felt like LLE could buckle, 1.3 ft/sec with RW (7/23) Goal status: MET  6.  Pt will be able to stand for 5 minutes with supervision in order to improve standing tolerance for ADLs.  Baseline: Pt's spouse states pt can not stand for too long due to fatigue/weakness, pt unable to consistently stand for 5 min without LOB to the L  Goal status: IN PROGRESS  LONG TERM GOALS: Target date: 03/23/2023  Pt will be independent with final HEP with family supervision in order to build upon functional gains made in therapy. Baseline:  Goal status: INITIAL  2.  Pt will improve Berg score to 10/56 for decreased fall risk Baseline: 2/56 (7/2) Goal status: INITIAL  3.  Pt will improve gait speed with RW vs. LRAD to at least 1.8 ft/sec in order to demo improved household mobility/decr fall risk  Baseline: 44.22 seconds with RW = .74 ft/sec, pt needing one brief standing rest break due to LLE weakness and felt like LLE could buckle Goal status: INITIAL  4.  Pt will ambulate at least 400' with LRAD and supervision (over indoor/outdoor paved surfaces) in order to demo improved household/community mobility.  Baseline:  24.5 seconds with no UE support Goal status: INITIAL  5.  Pt will improve 5x sit<>stand to less than or equal to 16 sec with no UE support to demonstrate improved functional strength and transfer efficiency.  Baseline:  Goal status: INITIAL    ASSESSMENT:  CLINICAL IMPRESSION: Pt's BP remains stable during session both seated and standing. Able to progress to [facility] and training without AD in today's session without buckling. Bioness used for quad activation to improve stability  intermittently throughout the session. Patient demonstrates increased need for assistance with step down with [facility] so will continue to progress eccentric lowering. Will continue per POC.  OBJECTIVE IMPAIRMENTS: Abnormal gait, decreased activity tolerance, decreased balance, decreased coordination, decreased endurance, decreased knowledge of use of DME, decreased mobility, difficulty walking, decreased ROM, decreased strength, and postural dysfunction.   ACTIVITY LIMITATIONS: carrying, lifting, bending, standing, stairs, transfers, and locomotion level  PARTICIPATION LIMITATIONS: cleaning, driving, shopping, community activity, and yard work  PERSONAL FACTORS: Age, Behavior pattern, Past/current experiences, Time since onset of injury/illness/exacerbation, and 3+ comorbidities: CAD, HTN, TIA, anxiety, BPPV, HLD, gout, prostate CA, CABG and stents  are also affecting patient's functional outcome.   REHAB POTENTIAL: Good  CLINICAL DECISION MAKING: Evolving/moderate complexity  EVALUATION COMPLEXITY: Moderate  PLAN:  PT FREQUENCY: 2x/week  PT DURATION: 8 weeks  PLANNED INTERVENTIONS: Therapeutic exercises, Therapeutic activity, Neuromuscular re-education, Balance training, Gait training, Patient/Family education, Self Care, Joint mobilization, Stair training, Orthotic/Fit training, DME instructions, Aquatic Therapy, Manual therapy, and Re-evaluation  PLAN FOR NEXT SESSION:  CHECK BP!!! Add to HEP as appropriate. Gait with RW, once knee continues to stay more stable and balance is better can work towards a cane? Work on [employer], standing balance, functional strength, SLS activities.   Continue working with bioness to L quad. Work on [employer] outdoors and up and down curbs  [employer], PT, DPT 03/12/23 3:36 PM

## 2023-03-16 ENCOUNTER — Encounter: Payer: Self-pay | Admitting: Physical Therapy

## 2023-03-16 ENCOUNTER — Ambulatory Visit: Payer: Medicare Other | Admitting: Physical Therapy

## 2023-03-16 VITALS — BP 101/61 | HR 72

## 2023-03-16 DIAGNOSIS — I69354 Hemiplegia and hemiparesis following cerebral infarction affecting left non-dominant side: Secondary | ICD-10-CM | POA: Diagnosis not present

## 2023-03-16 DIAGNOSIS — R2689 Other abnormalities of gait and mobility: Secondary | ICD-10-CM | POA: Diagnosis not present

## 2023-03-16 DIAGNOSIS — I639 Cerebral infarction, unspecified: Secondary | ICD-10-CM | POA: Diagnosis not present

## 2023-03-16 DIAGNOSIS — Z9181 History of falling: Secondary | ICD-10-CM | POA: Diagnosis not present

## 2023-03-16 DIAGNOSIS — R2681 Unsteadiness on feet: Secondary | ICD-10-CM | POA: Diagnosis not present

## 2023-03-16 DIAGNOSIS — M6281 Muscle weakness (generalized): Secondary | ICD-10-CM

## 2023-03-16 NOTE — Therapy (Signed)
OUTPATIENT PHYSICAL THERAPY NEURO TREATMENT  Patient Name: Tyler Guerra. MRN: [id number] DOB:February 08, 1951, 72 y.o., male Today's Date: 03/16/2023   PCP: Joaquim Nam, MD  REFERRING PROVIDER: Merlene Laughter, DO     END OF SESSION:  PT End of Session - 03/16/23 1320     Visit Number 13    Number of Visits 17    Date for PT Re-Evaluation 03/27/23    Authorization Type Medicare -Part A & B    PT Start Time 1317    PT Stop Time 1359    PT Time Calculation (min) 42 min    Equipment Utilized During Treatment Gait belt    Activity Tolerance Patient tolerated treatment well   limited by lower BP   Behavior During Therapy [facility] for tasks assessed/performed                  Past Medical History:  Diagnosis Date   Anxiety state 03/16/2016   Aphasia 01/26/2017   Benign localized prostatic hyperplasia with lower urinary tract symptoms (LUTS)    BPV (benign positional vertigo) 11/02/2014   Carotid artery stenosis    PER DUPLEX 10-22-2015  BILATERAL ICA 1-39%   Cataract    Coronary artery disease    CABG 2000 with LIMA> LAD, SVG > RCA and free radial > OM. 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   Gout    Gout 07/18/2010   Qualifier: Diagnosis of  By: Para March MD, Cheree Ditto     H/O eye injury    chronic changes to left eye after injury   Hand weakness 03/02/2018   Hearing loss 04/21/2013   History of TIA (transient ischemic attack)    03/ 2017  no residual after brief episode loss peripheral vision   HOH (hard of hearing)    Hyperglycemia 05/17/2016   Hyperlipemia    Hyperlipidemia 07/18/2010   Qualifier: Diagnosis of  By: Para March MD, Graham     Hypertension     HYPERTENSION, BENIGN ESSENTIAL 07/18/2010   Qualifier: Diagnosis of  By: Para March MD, Graham     Hypokalemia 01/26/2017   Knee pain 03/13/2019   Left shoulder pain 09/04/2018   Pneumothorax 10/11/2019   RIGHT    Prostate cancer (HCC) UROLOGIST-  DR GRAPEY/  ONCOLOGIST-  DR MANNING   dx 2015--- Stage T1c, Gleason 3+4, PSA 4.03, vol 44cc   PVC's (premature ventricular contractions)    Rash and nonspecific skin eruption 10/14/2015   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   S/P CABG x 05 Dec 1998   Skin lesion 10/14/2015   Stroke [facility])    TIA (transient ischemic attack) 10/14/2015   Ventral hernia 10/26/2011   Wears glasses    Past Surgical History:  Procedure Laterality Date   APPENDECTOMY  1975   CARDIAC CATHETERIZATION  12-22-2001   dr Verdis Prime   severe native vessel disease mLAD 60-70%,  total occulsion pCFX  and pRCA/  widely patent saphenous vein , free radial , and LIMA grafts/  minminal lv dysfunction, ef 60%   CHEST TUBE INSERTION Right 10/11/2019   CORONARY ARTERY BYPASS GRAFT  12/1998   LIMA to LAD,  SVG  to PDA and Diagonal, Free radial graft to OM   CORONARY STENT INTERVENTION N/A 05/26/2021   Procedure: CORONARY STENT INTERVENTION;  Surgeon: Orbie Pyo, MD;  Location: [facility] INVASIVE [facility];  Service: Cardiovascular;  Laterality: N/A;   Exericse treadmill test  last one 01-03-2014  dr Verdis Prime   normal exercise tolerance w/ hypertensive repsonse,  no ischemic EKG changes, appropriate HR response & recovery ([facility] TM score 9;  Low Risk , PVC's w/ exertion)   EYE SURGERY     FRACTURE SURGERY     LEFT HEART CATH AND CORS/GRAFTS ANGIOGRAPHY N/A 05/26/2021   Procedure: LEFT HEART CATH AND CORS/GRAFTS ANGIOGRAPHY;  Surgeon: Orbie Pyo, MD;  Location: [facility] INVAS[facility] CV [facility];  Service: Cardiovascular;  Laterality: N/A;   LOOP RECORDER INSERTION N/A 12/22/2022   Procedure: LOOP RECORDER INSERTION;  Surgeon: Marinus Maw, MD;  Location: [facility];   Service: Cardiovascular;  Laterality: N/A;   PROSTATE BIOPSY     RADIOACTIVE SEED IMPLANT N/A 01/13/2016   Procedure: RADIOACTIVE SEED IMPLANT/BRACHYTHERAPY IMPLANT;  Surgeon: Barron Alvine, MD;  Location: [facility];  Service: Urology;  Laterality: N/A;   Patient Active Problem List   Diagnosis Date Noted   Acute CVA (cerebrovascular accident) (HCC) 12/22/2022   CVA (cerebral vascular accident) (HCC) 12/22/2022   Constipation 03/04/2022   CAD (coronary artery disease) 05/24/2021   NSTEMI (non-ST elevated myocardial infarction) (HCC) 05/24/2021   Pneumothorax 10/11/2019   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   Knee joint symptom 03/13/2019   Left shoulder pain 09/04/2018   Diabetes mellitus without complication (HCC) 03/02/2018   Anxiety state 03/16/2016   Pseudophakia of both eyes 02/24/2016   TIA (transient ischemic attack) 10/14/2015   BPV (benign positional vertigo) 11/02/2014   Advance care planning 05/13/2014   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   Hearing loss 04/21/2013   Ventral hernia 10/26/2011   Medicare annual wellness visit, subsequent 10/26/2011   Prostate cancer (HCC) 10/26/2011   Coronary atherosclerosis 07/19/2010   GERD 07/19/2010   Hyperlipidemia 07/18/2010   Gout 07/18/2010   Hypertension 07/18/2010    ONSET DATE: 12/22/2022  REFERRING DIAG: I63.9 (ICD-10-CM) - Acute CVA (cerebrovascular accident) (HCC)  THERAPY DIAG:  Unsteadiness on feet  Other abnormalities of gait and mobility  Muscle weakness (generalized)  Hemiplegia and hemiparesis following cerebral infarction affecting left non-dominant side (HCC)  Rationale for Evaluation and Treatment: Rehabilitation  SUBJECTIVE:  SUBJECTIVE STATEMENT: Had a bad day Sunday. Also had a migraine. Has  an appt next week with Dr. Para March. Reports he feels like a 5/10 today.   Pt accompanied by:  [name]   PERTINENT HISTORY: Pt is 72 yo male who presented on 12/21/22 with intermittent L leg weakness past 3 weeks. 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 Pt discharged on 12/22/22. Pt with hx including but not limited to CAD, HTN, TIA, anxiety, BPPV, HLD, gout, prostate CA, CABG and stents.  Pt also hard of hearing.   Vitals:   03/16/23 1324 03/16/23 1352  BP: 105/67 101/61  Pulse: 74 72     PAIN:  Are you having pain? No  PRECAUTIONS: Fall  WEIGHT BEARING RESTRICTIONS: No  FALLS: Has patient fallen in last 6 months? Yes. Number of falls 5, denies injury, just some bruises   LIVING ENVIRONMENT: Lives with: lives with their spouse and has family closeby and grand son is always home  Lives in: House/apartment Stairs: Yes: Internal: 12 steps; on right going up, level entry into house  Has following equipment at home: Dan Humphreys - 2 wheeled, Environmental consultant - 4 wheeled, shower chair, and Uses standard 2 wheeled RW, uses rollator out in the community as he can sit in it.   PLOF: Independent and Leisure: enjoys biking and his riding his UTV  PATIENT GOALS: Wants to build more strength in L leg  OBJECTIVE:   DIAGNOSTIC FINDINGS: MRI brain wo contrast 12/21/2022: Intermediate sized area of acute ischemia within the right anterior cerebral artery territory. No hemorrhage or mass effect.   COGNITION: Overall cognitive status: Within functional limits for tasks assessed   SENSATION: Light touch: WFL  Reports some tingling in feet at night.   COORDINATION: Heel to shin: slightly slower with LLE   RAM with BUE: impaired coordination with LUE   POSTURE: rounded shoulders, forward head, and weight shift right  LOWER EXTREMITY ROM:     Decr LLE knee extension AROM due to weakness  LOWER EXTREMITY MMT:    MMT Right Eval Left Eval  Hip flexion 5 4+   Hip extension    Hip abduction 5 5  Hip adduction 5 5  Hip internal rotation    Hip external rotation    Knee flexion 5 5  Knee extension 5 3  Ankle dorsiflexion 5 4  Ankle plantarflexion    Ankle inversion    Ankle eversion    (Blank rows = not tested)  All tested in sitting.    TODAY'S TREATMENT:                                                                                                                               Therapeutic Activity:   BP assessed at beginning of session and towards the end.  Vitals:   03/16/23 1324 03/16/23 1352  BP: 105/67 101/61  Pulse: 74 72  Pt reporting feeling a little on the dizzy side at the end of session after standing tasks.   Pt to follow-up with his PCP next week regarding BP. Discussed potential use of abdominal binder or compression socks to help with BP and pt can ask PCP regarding this. Pt also reports that he has compression socks at home. Discussed can try to wear them on a day where his BP is lower to see if that helps and to make sure that he drinks plenty of water at home (pt reports that he has not been drinking enough water).   NMR:  With 6" step, alternating forward step ups 2 x 10 reps each side, pt needing to use BUE support, cues for sequencing, and slowed controlled lowering. Pt reporting RPE as 6/10 after each set. Towards end of each set, pt with some knee instability noted due to fatigue  With 6 blaze pods, 4 on floor, 2 on 6" step for SLS stability, weight shifting, reaction times, visual scanning, performed on random setting Performed 4 bouts of 1 minute each with either seated/standing rest break in between: 13 hits, 17 hits, 20 hits, 23 hits, cues to try to perform without UE support as pt reliant on // bars and tends to hold on. Pt with LLE fatigue towards end of each bout   GAIT: Gait pattern: decreased step length- Right, decreased stance time- Left, decreased stride length, trunk flexed, and poor foot  clearance- Left, L knee instability when fatigued  Distance walked: 230' x 1 with RW, 40' x 1 with SPC  Level of assistance: Supervision-CGA Comments: Pt able to ambulate with supervision with RW for 2 laps, pt with some episodes of maybe knees wanting to buckle during the first 115', but none during the 2nd lap.  Ambulated a short distance to // bars with SPC with CGA, at end of session, ambulated back to mat table with RW as pt was feeling lightheaded.    PATIENT EDUCATION: Education details: Continue HEP, see therapeutic activity  Person educated: Patient and Spouse Education method: Medical illustrator Education comprehension: verbalized understanding and needs further education  HOME EXERCISE PROGRAM: Access Code: [email or web address] URL: [email or web address] Date: 02/09/2023 Prepared by: Sherlie Ban  Program Notes Sitting at edge of mat table, with left leg tucked behind right, holding onto chair, gently lean forward and lift bottom off mat and hold for a few seconds and then slowly lower to the mat.   Exercises - Sit to Stand  - 1 x daily - 7 x weekly - 3 sets - 5 reps - with LLE staggered behind R  - Seated Long Arc Quad  - 1 x daily - 7 x weekly - 3 sets - 5 reps - 5 sec hold - Supine Bridge  - 1 x daily - 7 x weekly - 1-2 sets - 10 reps - Supine Hip Adduction Isometric with Ball  - 1 x daily - 7 x weekly - 1-2 sets - 10 reps - Standing Forward Step Taps with Counter Support  - 1 x daily - 7 x weekly - 3 sets - 10 reps - Mini Squat with Counter Support  - 1 x daily - 7 x weekly - 3 sets - 10 reps  GOALS: Goals reviewed with patient? Yes  SHORT TERM GOALS: Target date: 02/23/2023  Pt will be independent with initial HEP with family supervision in order to build upon functional gains made in therapy. Baseline: Goal status: MET  2.  Pt and  pt's family will verbalize understanding on fall prevention in the home.  Baseline:  Goal status: IN  PROGRESS  3.  Pt will improve Berg score to 6/56 for decreased fall risk Baseline: 2/56 (7/2), 12/56 (7/23) Goal status: MET  4. Pt will improve 5x sit<>stand to less than or equal to 20 sec with no UE support to demonstrate improved functional strength and transfer efficiency.  Baseline: 24.5 seconds with no UE support, 16.13 sec (7/23) Goal status: MET  5.  Pt will improve gait speed with RW to at least 1.2 ft/sec in order to demo decr fall risk.  Baseline: 44.22 seconds with RW = .74 ft/sec, pt needing one brief standing rest break due to LLE weakness and felt like LLE could buckle, 1.3 ft/sec with RW (7/23) Goal status: MET  6.  Pt will be able to stand for 5 minutes with supervision in order to improve standing tolerance for ADLs.  Baseline: Pt's spouse states pt can not stand for too long due to fatigue/weakness, pt unable to consistently stand for 5 min without LOB to the L  Goal status: IN PROGRESS  LONG TERM GOALS: Target date: 03/23/2023  Pt will be independent with final HEP with family supervision in order to build upon functional gains made in therapy. Baseline:  Goal status: INITIAL  2.  Pt will improve Berg score to 10/56 for decreased fall risk Baseline: 2/56 (7/2) Goal status: INITIAL  3.  Pt will improve gait speed with RW vs. LRAD to at least 1.8 ft/sec in order to demo improved household mobility/decr fall risk  Baseline: 44.22 seconds with RW = .74 ft/sec, pt needing one brief standing rest break due to LLE weakness and felt like LLE could buckle Goal status: INITIAL  4.  Pt will ambulate at least 400' with LRAD and supervision (over indoor/outdoor paved surfaces) in order to demo improved household/community mobility.  Baseline:  24.5 seconds with no UE support Goal status: INITIAL  5.  Pt will improve 5x sit<>stand to less than or equal to 16 sec with no UE support to demonstrate improved functional strength and transfer efficiency.  Baseline:  Goal status:  INITIAL    ASSESSMENT:  CLINICAL IMPRESSION: Pt's BP running on the lower side today. Pt initially with no symptoms of dizziness or lightheadedness. Worked on standing strengthening tasks with step ups and SLS tasks/reaction times with blaze pods. With blaze pod activity, pt primarily needing to use RUE support for balance despite cues to let go as pt more fearful of standing on LLE. Rest breaks intermittently due to LLE fatigue. At end of session, pt reporting feeling some dizziness that subsided with seated rest break. Pt follows up with PCP next week. Will continue per POC.  OBJECTIVE IMPAIRMENTS: Abnormal gait, decreased activity tolerance, decreased balance, decreased coordination, decreased endurance, decreased knowledge of use of DME, decreased mobility, difficulty walking, decreased ROM, decreased strength, and postural dysfunction.   ACTIVITY LIMITATIONS: carrying, lifting, bending, standing, stairs, transfers, and locomotion level  PARTICIPATION LIMITATIONS: cleaning, driving, shopping, community activity, and yard work  PERSONAL FACTORS: Age, Behavior pattern, Past/current experiences, Time since onset of injury/illness/exacerbation, and 3+ comorbidities: CAD, HTN, TIA, anxiety, BPPV, HLD, gout, prostate CA, CABG and stents  are also affecting patient's functional outcome.   REHAB POTENTIAL: Good  CLINICAL DECISION MAKING: Evolving/moderate complexity  EVALUATION COMPLEXITY: Moderate  PLAN:  PT FREQUENCY: 2x/week  PT DURATION: 8 weeks  PLANNED INTERVENTIONS: Therapeutic exercises, Therapeutic activity, Neuromuscular re-education, Balance training, Gait training, Patient/Family  education, Self Care, Joint mobilization, Stair training, Orthotic/Fit training, DME instructions, Aquatic Therapy, Manual therapy, and Re-evaluation  PLAN FOR NEXT SESSION:  CHECK BP!!! Add to HEP as appropriate. Gait with RW, once knee continues to stay more stable and balance is better can work  towards a cane? Work on [employer], standing balance, functional strength, SLS activities.   Continue working with bioness to L quad. Work on [employer] outdoors and up and down curbs  [employer], PT, DPT 03/16/23 2:04 PM

## 2023-03-17 ENCOUNTER — Other Ambulatory Visit: Payer: Self-pay | Admitting: Family Medicine

## 2023-03-17 ENCOUNTER — Other Ambulatory Visit: Payer: Self-pay | Admitting: Physician Assistant

## 2023-03-17 DIAGNOSIS — K219 Gastro-esophageal reflux disease without esophagitis: Secondary | ICD-10-CM

## 2023-03-17 NOTE — Progress Notes (Signed)
[facility] Summary Report / Loop Recorder 

## 2023-03-18 ENCOUNTER — Encounter (INDEPENDENT_AMBULATORY_CARE_PROVIDER_SITE_OTHER): Payer: Self-pay

## 2023-03-19 ENCOUNTER — Ambulatory Visit: Payer: Medicare Other | Admitting: Physical Therapy

## 2023-03-19 ENCOUNTER — Encounter: Payer: Self-pay | Admitting: Physical Therapy

## 2023-03-19 VITALS — BP 101/61 | HR 69

## 2023-03-19 DIAGNOSIS — R2689 Other abnormalities of gait and mobility: Secondary | ICD-10-CM | POA: Diagnosis not present

## 2023-03-19 DIAGNOSIS — R2681 Unsteadiness on feet: Secondary | ICD-10-CM

## 2023-03-19 DIAGNOSIS — I639 Cerebral infarction, unspecified: Secondary | ICD-10-CM | POA: Diagnosis not present

## 2023-03-19 DIAGNOSIS — Z9181 History of falling: Secondary | ICD-10-CM | POA: Diagnosis not present

## 2023-03-19 DIAGNOSIS — M6281 Muscle weakness (generalized): Secondary | ICD-10-CM | POA: Diagnosis not present

## 2023-03-19 DIAGNOSIS — I69354 Hemiplegia and hemiparesis following cerebral infarction affecting left non-dominant side: Secondary | ICD-10-CM | POA: Diagnosis not present

## 2023-03-19 NOTE — Therapy (Signed)
OUTPATIENT PHYSICAL THERAPY NEURO TREATMENT  Patient Name: Tyler Guerra. MRN: [id number] DOB:February 12, 1951, 72 y.o., male Today's Date: 03/22/2023   PCP: Tyler Nam, MD  REFERRING PROVIDER: Merlene Laughter, DO     END OF SESSION:     03/19/23 1321  PT Visits / Re-Eval  Visit Number 14  Number of Visits 17  Date for PT Re-Evaluation 03/27/23  Authorization  Authorization Type Medicare -Part A & B  PT Time Calculation  PT Start Time 1320  PT Stop Time 1400  PT Time Calculation (min) 40 min  PT - End of Session  Equipment Utilized During Treatment Gait belt  Activity Tolerance Patient tolerated treatment well  Behavior During Therapy [name] for tasks assessed/performed        Past Medical History:  Diagnosis Date   Anxiety state 03/16/2016   Aphasia 01/26/2017   Benign localized prostatic hyperplasia with lower urinary tract symptoms (LUTS)    BPV (benign positional vertigo) 11/02/2014   Carotid artery stenosis    PER DUPLEX 10-22-2015  BILATERAL ICA 1-39%   Cataract    Coronary artery disease    CABG 2000 with LIMA> LAD, SVG > RCA and free radial > OM. 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   Gout    Gout 07/18/2010   Qualifier: Diagnosis of  By: Para March MD, Cheree Ditto     H/O eye injury    chronic changes to left eye after injury   Hand weakness 03/02/2018   Hearing loss 04/21/2013   History of TIA (transient ischemic attack)    03/ 2017  no residual after brief episode loss peripheral vision   HOH (hard of hearing)    Hyperglycemia 05/17/2016   Hyperlipemia    Hyperlipidemia 07/18/2010   Qualifier: Diagnosis of  By: Para March MD, Graham      Hypertension    HYPERTENSION, BENIGN ESSENTIAL 07/18/2010   Qualifier: Diagnosis of  By: Para March MD, Graham     Hypokalemia 01/26/2017   Knee pain 03/13/2019   Left shoulder pain 09/04/2018   Pneumothorax 10/11/2019   RIGHT    Prostate cancer (HCC) UROLOGIST-  DR GRAPEY/  ONCOLOGIST-  DR MANNING   dx 2015--- Stage T1c, Gleason 3+4, PSA 4.03, vol 44cc   PVC's (premature ventricular contractions)    Rash and nonspecific skin eruption 10/14/2015   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   S/P CABG x 05 Dec 1998   Skin lesion 10/14/2015   Stroke [facility])    TIA (transient ischemic attack) 10/14/2015   Ventral hernia 10/26/2011   Wears glasses    Past Surgical History:  Procedure Laterality Date   APPENDECTOMY  1975   CARDIAC CATHETERIZATION  12-22-2001   dr Verdis Prime   severe native vessel disease mLAD 60-70%,  total occulsion pCFX  and pRCA/  widely patent saphenous vein , free radial , and LIMA grafts/  minminal lv dysfunction, ef 60%   CHEST TUBE INSERTION Right 10/11/2019   CORONARY ARTERY BYPASS GRAFT  12/1998   LIMA to LAD,  SVG to PDA and Diagonal, Free radial graft to OM   CORONARY STENT INTERVENTION N/A 05/26/2021   Procedure: CORONARY STENT  INTERVENTION;  Surgeon: Orbie Pyo, MD;  Location: [facility] INVASIVE CV LAB;  Service: Cardiovascular;  Laterality: N/A;   Exericse treadmill test  last one 01-03-2014  dr Verdis Prime   normal exercise tolerance w/ hypertensive repsonse,  no ischemic EKG changes, appropriate HR response & recovery ([facility] TM score 9;  Low Risk , PVC's w/ exertion)   EYE SURGERY     FRACTURE SURGERY     LEFT HEART CATH AND CORS/GRAFTS ANGIOGRAPHY N/A 05/26/2021   Procedure: LEFT HEART CATH AND CORS/GRAFTS ANGIOGRAPHY;  Surgeon: Orbie Pyo, MD;  Location: [facility] INVASIVE CV LAB;  Service: Cardiovascular;  Laterality: N/A;   LOOP RECORDER INSERTION N/A 12/22/2022   Procedure: LOOP RECORDER INSERTION;  Surgeon: Marinus Maw, MD;  Location: [facility]  INVASIVE CV LAB;  Service: Cardiovascular;  Laterality: N/A;   PROSTATE BIOPSY     RADIOACTIVE SEED IMPLANT N/A 01/13/2016   Procedure: RADIOACTIVE SEED IMPLANT/BRACHYTHERAPY IMPLANT;  Surgeon: Barron Alvine, MD;  Location: [facility];  Service: Urology;  Laterality: N/A;   Patient Active Problem List   Diagnosis Date Noted   Acute CVA (cerebrovascular accident) (HCC) 12/22/2022   CVA (cerebral vascular accident) (HCC) 12/22/2022   Constipation 03/04/2022   CAD (coronary artery disease) 05/24/2021   NSTEMI (non-ST elevated myocardial infarction) (HCC) 05/24/2021   Pneumothorax 10/11/2019   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   Knee joint symptom 03/13/2019   Left shoulder pain 09/04/2018   Diabetes mellitus without complication (HCC) 03/02/2018   Anxiety state 03/16/2016   Pseudophakia of both eyes 02/24/2016   TIA (transient ischemic attack) 10/14/2015   BPV (benign positional vertigo) 11/02/2014   Advance care planning 05/13/2014   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   Hearing loss 04/21/2013   Ventral hernia 10/26/2011   Medicare annual wellness visit, subsequent 10/26/2011   Prostate cancer (HCC) 10/26/2011   Coronary atherosclerosis 07/19/2010   GERD 07/19/2010   Hyperlipidemia 07/18/2010   Gout 07/18/2010   Hypertension 07/18/2010    ONSET DATE: 12/22/2022  REFERRING DIAG: I63.9 (ICD-10-CM) - Acute CVA (cerebrovascular accident) (HCC)  THERAPY DIAG:  Unsteadiness on feet  Other abnormalities of gait and mobility  Muscle weakness (generalized)  Rationale for Evaluation and Treatment: Rehabilitation  SUBJECTIVE:                                                                                                                                                                                             SUBJECTIVE STATEMENT:  Has had a good couple of days. Feeling like its a good day. BP for the past couple of days have been somewhat ok.  Has not  tried to wear the compression socks.   Pt accompanied by:  wife Tyler Guerra   PERTINENT HISTORY: Pt is 72 yo male who presented on 12/21/22 with intermittent L leg weakness past 3 weeks. 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 Pt discharged on 12/22/22. Pt with hx including but not limited to CAD, HTN, TIA, anxiety, BPPV, HLD, gout, prostate CA, CABG and stents.  Pt also hard of hearing.   Vitals:   03/19/23 1328 03/19/23 1350  BP: 113/68 101/61  Pulse: 68 69     PAIN:  Are you having pain? No  PRECAUTIONS: Fall  WEIGHT BEARING RESTRICTIONS: No  FALLS: Has patient fallen in last 6 months? Yes. Number of falls 5, denies injury, just some bruises   LIVING ENVIRONMENT: Lives with: lives with their spouse and has family closeby and grand son is always home  Lives in: House/apartment Stairs: Yes: Internal: 12 steps; on right going up, level entry into house  Has following equipment at home: Dan Humphreys - 2 wheeled, Environmental consultant - 4 wheeled, shower chair, and Uses standard 2 wheeled RW, uses rollator out in the community as he can sit in it.   PLOF: Independent and Leisure: enjoys biking and his riding his UTV  PATIENT GOALS: Wants to build more strength in L leg  OBJECTIVE:   DIAGNOSTIC FINDINGS: MRI brain wo contrast 12/21/2022: Intermediate sized area of acute ischemia within the right anterior cerebral artery territory. No hemorrhage or mass effect.   COGNITION: Overall cognitive status: Within functional limits for tasks assessed   SENSATION: Light touch: WFL  Reports some tingling in feet at night.   COORDINATION: Heel to shin: slightly slower with LLE   RAM with BUE: impaired coordination with LUE   POSTURE: rounded shoulders, forward head, and weight shift right  LOWER EXTREMITY ROM:     Decr LLE knee extension AROM due to weakness  LOWER EXTREMITY MMT:    MMT Right Eval Left Eval  Hip flexion 5 4+  Hip extension    Hip abduction 5 5   Hip adduction 5 5  Hip internal rotation    Hip external rotation    Knee flexion 5 5  Knee extension 5 3  Ankle dorsiflexion 5 4  Ankle plantarflexion    Ankle inversion    Ankle eversion    (Blank rows = not tested)  All tested in sitting.    TODAY'S TREATMENT:                                                                                                                               Therapeutic Activity:   BP assessed at beginning of session and halfway during session as pt reporting feeling a little on the off balance/dizzy side. Pt has a seated rest break and was provided water and pt reporting feeling better afterwards.  Vitals:   03/19/23 1328 03/19/23 1350  BP:  113/68 101/61  Pulse: 68 69     NMR:  On rockerboard in A/P direction:  Weight shifting x15 reps for hip/ankle strategy, beginning with BUE support > none, cues for tall posture and looking up into mirror  Balloon taps, with one PT providing min A for balance and 2nd PT performing balloon taps with pt, performed 2 x 15 reps, with cues to alternate hitting with each UE, needing min A due to pt with tendency to lose his balance posteriorly  Tandem gait down and back x3 reps with pt needing UE support Holding tandem stance 2 x 20-30 seconds bilaterally, intermittent UE support with pt more challenged with LLE posteriorly  With 4 larger orange obstacles, performed step-to pattern down and back x3 reps, alternating sets leading with RLE and LLE, towards end of each set, pt's LLE got more fatigued and almost buckled, with seated rest break needed   GAIT: Gait pattern: decreased step length- Right, decreased stance time- Left, decreased stride length, trunk flexed, and poor foot clearance- Left, L knee instability when fatigued  Distance walked: 2 x 50' with SPC  Level of assistance: Supervision-CGA Comments: CGA with gait with SPC to and from // bars with no knee instability noted and pt with good balance.     PATIENT EDUCATION: Education details: Scheduling more appts, following up with PCP Monday about BP and asking about abdominal binder or compression socks  Person educated: Patient and Spouse Education method: Medical illustrator Education comprehension: verbalized understanding and needs further education  HOME EXERCISE PROGRAM: Access Code: [email or web address] URL: [email or web address] Date: 02/09/2023 Prepared by: Sherlie Ban  Program Notes Sitting at edge of mat table, with left leg tucked behind right, holding onto chair, gently lean forward and lift bottom off mat and hold for a few seconds and then slowly lower to the mat.   Exercises - Sit to Stand  - 1 x daily - 7 x weekly - 3 sets - 5 reps - with LLE staggered behind R  - Seated Long Arc Quad  - 1 x daily - 7 x weekly - 3 sets - 5 reps - 5 sec hold - Supine Bridge  - 1 x daily - 7 x weekly - 1-2 sets - 10 reps - Supine Hip Adduction Isometric with Ball  - 1 x daily - 7 x weekly - 1-2 sets - 10 reps - Standing Forward Step Taps with Counter Support  - 1 x daily - 7 x weekly - 3 sets - 10 reps - Mini Squat with Counter Support  - 1 x daily - 7 x weekly - 3 sets - 10 reps  GOALS: Goals reviewed with patient? Yes  SHORT TERM GOALS: Target date: 02/23/2023  Pt will be independent with initial HEP with family supervision in order to build upon functional gains made in therapy. Baseline: Goal status: MET  2.  Pt and pt's family will verbalize understanding on fall prevention in the home.  Baseline:  Goal status: IN PROGRESS  3.  Pt will improve Berg score to 6/56 for decreased fall risk Baseline: 2/56 (7/2), 12/56 (7/23) Goal status: MET  4. Pt will improve 5x sit<>stand to less than or equal to 20 sec with no UE support to demonstrate improved functional strength and transfer efficiency.  Baseline: 24.5 seconds with no UE support, 16.13 sec (7/23) Goal status: MET  5.  Pt will improve gait speed  with RW to at least 1.2 ft/sec in order to demo decr fall  risk.  Baseline: 44.22 seconds with RW = .74 ft/sec, pt needing one brief standing rest break due to LLE weakness and felt like LLE could buckle, 1.3 ft/sec with RW (7/23) Goal status: MET  6.  Pt will be able to stand for 5 minutes with supervision in order to improve standing tolerance for ADLs.  Baseline: Pt's spouse states pt can not stand for too long due to fatigue/weakness, pt unable to consistently stand for 5 min without LOB to the L  Goal status: IN PROGRESS  LONG TERM GOALS: Target date: 03/23/2023  Pt will be independent with final HEP with family supervision in order to build upon functional gains made in therapy. Baseline:  Goal status: INITIAL  2.  Pt will improve Berg score to 10/56 for decreased fall risk Baseline: 2/56 (7/2) Goal status: INITIAL  3.  Pt will improve gait speed with RW vs. LRAD to at least 1.8 ft/sec in order to demo improved household mobility/decr fall risk  Baseline: 44.22 seconds with RW = .74 ft/sec, pt needing one brief standing rest break due to LLE weakness and felt like LLE could buckle Goal status: INITIAL  4.  Pt will ambulate at least 400' with LRAD and supervision (over indoor/outdoor paved surfaces) in order to demo improved household/community mobility.  Baseline:  24.5 seconds with no UE support Goal status: INITIAL  5.  Pt will improve 5x sit<>stand to less than or equal to 16 sec with no UE support to demonstrate improved functional strength and transfer efficiency.  Baseline:  Goal status: INITIAL    ASSESSMENT:  CLINICAL IMPRESSION: Pt's BP WNL, but pt did report some dizziness/lightheadedness after some standing tasks with pt needing a seated rest break. Pt's BP did drop a little bit (see above for more details). Pt to see his PCP next week to follow-up regarding orthostatics. Worked on standing balance tasks today with obstacle negotiation, unlevel surfaces, and narrow  BOS. Pt's LLE fatigued with obstacle negotiation. Will continue per POC.  OBJECTIVE IMPAIRMENTS: Abnormal gait, decreased activity tolerance, decreased balance, decreased coordination, decreased endurance, decreased knowledge of use of DME, decreased mobility, difficulty walking, decreased ROM, decreased strength, and postural dysfunction.   ACTIVITY LIMITATIONS: carrying, lifting, bending, standing, stairs, transfers, and locomotion level  PARTICIPATION LIMITATIONS: cleaning, driving, shopping, community activity, and yard work  PERSONAL FACTORS: Age, Behavior pattern, Past/current experiences, Time since onset of injury/illness/exacerbation, and 3+ comorbidities: CAD, HTN, TIA, anxiety, BPPV, HLD, gout, prostate CA, CABG and stents  are also affecting patient's functional outcome.   REHAB POTENTIAL: Good  CLINICAL DECISION MAKING: Evolving/moderate complexity  EVALUATION COMPLEXITY: Moderate  PLAN:  PT FREQUENCY: 2x/week  PT DURATION: 8 weeks  PLANNED INTERVENTIONS: Therapeutic exercises, Therapeutic activity, Neuromuscular re-education, Balance training, Gait training, Patient/Family education, Self Care, Joint mobilization, Stair training, Orthotic/Fit training, DME instructions, Aquatic Therapy, Manual therapy, and Re-evaluation  PLAN FOR NEXT SESSION:  CHECK BP!!! Make HEP more challenging.   Work on gait with cane. Work on [employer], standing balance, functional strength, SLS activities.   Bioness to L quad. Will need to check goals next week and re-cert!!!   Sherlie Ban, PT, DPT 03/22/23 8:32 AM

## 2023-03-22 ENCOUNTER — Encounter: Payer: Self-pay | Admitting: Family Medicine

## 2023-03-22 ENCOUNTER — Ambulatory Visit (INDEPENDENT_AMBULATORY_CARE_PROVIDER_SITE_OTHER): Payer: Medicare Other | Admitting: Family Medicine

## 2023-03-22 VITALS — BP 118/80 | HR 81 | Temp 98.3°F | Ht 69.0 in | Wt 213.0 lb

## 2023-03-22 DIAGNOSIS — I10 Essential (primary) hypertension: Secondary | ICD-10-CM | POA: Diagnosis not present

## 2023-03-22 DIAGNOSIS — K219 Gastro-esophageal reflux disease without esophagitis: Secondary | ICD-10-CM | POA: Diagnosis not present

## 2023-03-22 DIAGNOSIS — E119 Type 2 diabetes mellitus without complications: Secondary | ICD-10-CM

## 2023-03-22 LAB — HEMOGLOBIN A1C: Hgb A1c MFr Bld: 6.4 % (ref 4.6–6.5)

## 2023-03-22 MED ORDER — ATORVASTATIN CALCIUM 80 MG PO TABS: 80.0000 mg | ORAL_TABLET | Freq: Every day | ORAL | 3 refills | Status: DC

## 2023-03-22 MED ORDER — PANTOPRAZOLE SODIUM 40 MG PO TBEC: 40.0000 mg | DELAYED_RELEASE_TABLET | Freq: Every day | ORAL | 2 refills | Status: DC

## 2023-03-22 NOTE — Progress Notes (Unsigned)
Hypertension:    Using medication without problems or lightheadedness: see below Chest pain with exertion: no Edema:no Short of breath: no Some days his leg strength is better than others.  Variable BPs, up and down.  He has more sx when his BP is lower.   Strength wise, "today is a good day."    GERD controlled on PPI.  Rx sent.   Needed refill on atorvastatin. Rx sent.   Meds, vitals, and allergies reviewed.  PMH and SH reviewed  ROS: Per HPI unless specifically indicated in ROS section   GEN: nad, alert and oriented HEENT: mucous membranes moist NECK: supple w/o LA CV: rrr. PULM: ctab, no inc wob ABD: soft, +bs EXT: no edema SKIN: no acute rash  BP this AM was 176/103- he felt strong and stable at the time.  Lower BP at the OV after taking his med.    He didn't feel weak standing here at clinic.   30 minutes were devoted to patient care in this encounter (this includes time spent reviewing the patient's file/history, interviewing and examining the patient, counseling/reviewing plan with patient).

## 2023-03-22 NOTE — Patient Instructions (Addendum)
Drink enough water to keep your urine clear or light.   If needed try cutting flomax back to 1 pill a day for a few days and see if you feel better (stronger walking without worsening urine stream).  Try compression stockings in the daytime.   Update me in about 1 week by phone.    Go to the lab on the way out.   If you have mychart we'll likely use that to update you.    Take care.  Glad to see you.

## 2023-03-23 ENCOUNTER — Encounter: Payer: Self-pay | Admitting: Physical Therapy

## 2023-03-23 ENCOUNTER — Ambulatory Visit: Payer: Medicare Other | Admitting: Physical Therapy

## 2023-03-23 VITALS — BP 144/84 | HR 71

## 2023-03-23 DIAGNOSIS — R2681 Unsteadiness on feet: Secondary | ICD-10-CM | POA: Diagnosis not present

## 2023-03-23 DIAGNOSIS — I69354 Hemiplegia and hemiparesis following cerebral infarction affecting left non-dominant side: Secondary | ICD-10-CM | POA: Diagnosis not present

## 2023-03-23 DIAGNOSIS — R2689 Other abnormalities of gait and mobility: Secondary | ICD-10-CM | POA: Diagnosis not present

## 2023-03-23 DIAGNOSIS — M6281 Muscle weakness (generalized): Secondary | ICD-10-CM | POA: Diagnosis not present

## 2023-03-23 DIAGNOSIS — Z9181 History of falling: Secondary | ICD-10-CM | POA: Diagnosis not present

## 2023-03-23 DIAGNOSIS — I639 Cerebral infarction, unspecified: Secondary | ICD-10-CM | POA: Diagnosis not present

## 2023-03-23 LAB — BASIC METABOLIC PANEL
BUN: 21 mg/dL (ref 6–23)
CO2: 27 meq/L (ref 19–32)
Calcium: 9.3 mg/dL (ref 8.4–10.5)
Chloride: 108 mEq/L (ref 96–112)
Creatinine, Ser: 1.07 mg/dL (ref 0.40–1.50)
GFR: 69.95 mL/min (ref >60.00)
Glucose, Bld: 152 mg/dL — ABNORMAL HIGH (ref 70–99)
Potassium: 3.8 meq/L (ref 3.5–5.1)
Sodium: 144 meq/L (ref 135–145)

## 2023-03-23 NOTE — Therapy (Signed)
OUTPATIENT PHYSICAL THERAPY NEURO TREATMENT  Patient Name: Tyler Guerra. MRN: [id number] DOB:[birth year], 72 y.o., male 60 Date: 03/23/2023   PCP: Tyler Nam, MD  REFERRING PROVIDER: Merlene Laughter, DO     END OF SESSION:  PT End of Session - 03/23/23 1403     Visit Number 15    Number of Visits 17    Date for PT Re-Evaluation 03/27/23    Authorization Type Medicare -Part A & B    PT Start Time 1401    PT Stop Time 1444    PT Time Calculation (min) 43 min    Equipment Utilized During Treatment Gait belt    Activity Tolerance Patient tolerated treatment well    Behavior During Therapy WFL for tasks assessed/performed                 Past Medical History:  Diagnosis Date   Anxiety state 03/16/2016   Aphasia 01/26/2017   Benign localized prostatic hyperplasia with lower urinary tract symptoms (LUTS)    BPV (benign positional vertigo) 11/02/2014   Carotid artery stenosis    PER DUPLEX 10-22-2015  BILATERAL ICA 1-39%   Cataract    Coronary artery disease    CABG 2000 with LIMA> LAD, SVG > RCA and free radial > OM. 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   Gout    Gout 07/18/2010   Qualifier: Diagnosis of  By: Para March MD, Cheree Ditto     H/O eye injury    chronic changes to left eye after injury   Hand weakness 03/02/2018   Hearing loss 04/21/2013   History of TIA (transient ischemic attack)    03/ 2017  no residual after brief episode loss peripheral vision   HOH (hard of hearing)    Hyperglycemia 05/17/2016   Hyperlipemia    Hyperlipidemia 07/18/2010   Qualifier: Diagnosis of  By: Para March MD, Graham     Hypertension    HYPERTENSION, BENIGN  ESSENTIAL 07/18/2010   Qualifier: Diagnosis of  By: Para March MD, Graham     Hypokalemia 01/26/2017   Knee pain 03/13/2019   Left shoulder pain 09/04/2018   Pneumothorax 10/11/2019   RIGHT    Prostate cancer (HCC) UROLOGIST-  DR GRAPEY/  ONCOLOGIST-  DR MANNING   dx 2015--- Stage T1c, Gleason 3+4, PSA 4.03, vol 44cc   PVC's (premature ventricular contractions)    Rash and nonspecific skin eruption 10/14/2015   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)    TIA (transient ischemic attack) 10/14/2015   Ventral hernia 10/26/2011   Wears glasses    Past Surgical History:  Procedure Laterality Date   APPENDECTOMY  1975   CARDIAC CATHETERIZATION  12-22-2001   dr Verdis Prime   severe native vessel disease mLAD 60-70%,  total occulsion pCFX  and pRCA/  widely patent saphenous vein , free radial , and LIMA grafts/  minminal lv dysfunction, ef 60%   CHEST TUBE INSERTION Right 10/11/2019   CORONARY ARTERY BYPASS GRAFT  12/1998   LIMA to LAD,  SVG to PDA and Diagonal, Free radial  graft to OM   CORONARY STENT INTERVENTION N/A 05/26/2021   Procedure: CORONARY STENT INTERVENTION;  Surgeon: Orbie Pyo, MD;  Location: [facility];  Service: Cardiovascular;  Laterality: N/A;   Exericse treadmill test  last one 01-03-2014  dr Verdis Prime   normal exercise tolerance w/ hypertensive repsonse,  no ischemic EKG changes, appropriate HR response & recovery ([facility] TM score 9;  Low Risk , PVC's w/ exertion)   EYE SURGERY     FRACTURE SURGERY     LEFT HEART CATH AND CORS/GRAFTS ANGIOGRAPHY N/A 05/26/2021   Procedure: LEFT HEART CATH AND CORS/GRAFTS ANGIOGRAPHY;  Surgeon: Orbie Pyo, MD;  Location: [facility];  Service: Cardiovascular;  Laterality: N/A;   LOOP RECORDER INSERTION N/A 12/22/2022   Procedure: LOOP RECORDER INSERTION;  Surgeon: Marinus Maw, MD;  Location: [facility];  Service:  Cardiovascular;  Laterality: N/A;   PROSTATE BIOPSY     RADIOACTIVE SEED IMPLANT N/A 01/13/2016   Procedure: RADIOACTIVE SEED IMPLANT/BRACHYTHERAPY IMPLANT;  Surgeon: Barron Alvine, MD;  Location: [facility];  Service: Urology;  Laterality: N/A;   Patient Active Problem List   Diagnosis Date Noted   Acute CVA (cerebrovascular accident) (HCC) 12/22/2022   CVA (cerebral vascular accident) (HCC) 12/22/2022   Constipation 03/04/2022   CAD (coronary artery disease) 05/24/2021   NSTEMI (non-ST elevated myocardial infarction) (HCC) 05/24/2021   Pneumothorax 10/11/2019   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   Knee joint symptom 03/13/2019   Left shoulder pain 09/04/2018   Diabetes mellitus without complication (HCC) 03/02/2018   Anxiety state 03/16/2016   Pseudophakia of both eyes 02/24/2016   TIA (transient ischemic attack) 10/14/2015   BPV (benign positional vertigo) 11/02/2014   Advance care planning 05/13/2014   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   Hearing loss 04/21/2013   Ventral hernia 10/26/2011   Medicare annual wellness visit, subsequent 10/26/2011   Prostate cancer (HCC) 10/26/2011   Coronary atherosclerosis 07/19/2010   GERD 07/19/2010   Hyperlipidemia 07/18/2010   Gout 07/18/2010   Hypertension 07/18/2010    ONSET DATE: 12/22/2022  REFERRING DIAG: I63.9 (ICD-10-CM) - Acute CVA (cerebrovascular accident) (HCC)  THERAPY DIAG:  Unsteadiness on feet  Other abnormalities of gait and mobility  Muscle weakness (generalized)  Hemiplegia and hemiparesis following cerebral infarction affecting left non-dominant side (HCC)  Rationale for Evaluation and Treatment: Rehabilitation  SUBJECTIVE:  SUBJECTIVE STATEMENT:  Saw the PCP yesterday. Was instructed to drink more  water, only take one Flomax, and wear compression stockings during the day. No falls. Has been walking around the past few days. Having a good day today.   Pt accompanied by:  wife Tyler Guerra   PERTINENT HISTORY: Pt is 72 yo male who presented on 12/21/22 with intermittent L leg weakness past 3 weeks. 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 Pt discharged on 12/22/22. Pt with hx including but not limited to CAD, HTN, TIA, anxiety, BPPV, HLD, gout, prostate CA, CABG and stents.  Pt also hard of hearing.   Vitals:   03/23/23 1412  BP: (!) 144/84  Pulse: 71      PAIN:  Are you having pain? No  PRECAUTIONS: Fall  WEIGHT BEARING RESTRICTIONS: No  FALLS: Has patient fallen in last 6 months? Yes. Number of falls 5, denies injury, just some bruises   LIVING ENVIRONMENT: Lives with: lives with their spouse and has family closeby and grand son is always home  Lives in: House/apartment Stairs: Yes: Internal: 12 steps; on right going up, level entry into house  Has following equipment at home: Dan Humphreys - 2 wheeled, Environmental consultant - 4 wheeled, shower chair, and Uses standard 2 wheeled RW, uses rollator out in the community as he can sit in it.   PLOF: Independent and Leisure: enjoys biking and his riding his UTV  PATIENT GOALS: Wants to build more strength in L leg  OBJECTIVE:   DIAGNOSTIC FINDINGS: MRI brain wo contrast 12/21/2022: Intermediate sized area of acute ischemia within the right anterior cerebral artery territory. No hemorrhage or mass effect.   COGNITION: Overall cognitive status: Within functional limits for tasks assessed   SENSATION: Light touch: WFL  Reports some tingling in feet at night.   COORDINATION: Heel to shin: slightly slower with LLE   RAM with BUE: impaired coordination with LUE   POSTURE: rounded shoulders, forward head, and weight shift right  LOWER EXTREMITY ROM:     Decr LLE knee extension AROM due to weakness  LOWER  EXTREMITY MMT:    MMT Right Eval Left Eval  Hip flexion 5 4+  Hip extension    Hip abduction 5 5  Hip adduction 5 5  Hip internal rotation    Hip external rotation    Knee flexion 5 5  Knee extension 5 3  Ankle dorsiflexion 5 4  Ankle plantarflexion    Ankle inversion    Ankle eversion    (Blank rows = not tested)  All tested in sitting.    TODAY'S TREATMENT:                                                                                                                               Therapeutic Activity:   Gave information on [employer] yoga program and provided handout, pt very interested in doing this  BP assessed at start of session, pt with no dizziness or lightheadedness during session  Vitals:   03/23/23 1412  BP: (!) 144/84  Pulse: 71     [facility] PT Assessment - 03/23/23 1414       Berg Balance Test   Sit to Stand Able to stand without using hands and stabilize independently    Standing Unsupported Able to stand safely 2 minutes    Sitting with Back Unsupported but Feet Supported on Floor or Stool Able to sit safely and securely 2 minutes    Stand to Sit Sits safely with minimal use of hands    Transfers Able to transfer safely, minor use of hands    Standing Unsupported with Eyes Closed Able to stand 10 seconds safely    Standing Unsupported with Feet Together Able to place feet together independently and stand 1 minute safely    From Standing, Reach Forward with Outstretched Arm Can reach forward >12 cm safely (5")    From Standing Position, Pick up Object from Floor Able to pick up shoe safely and easily    From Standing Position, Turn to Look Behind Over each Shoulder Looks behind from both sides and weight shifts well    Turn 360 Degrees Able to turn 360 degrees safely but slowly   8.56 to R, 6.22 to L   Standing Unsupported, Alternately Place Feet on Step/Stool Able to stand independently and safely and complete 8 steps in 20 seconds    Standing  Unsupported, One Foot in Front Able to plae foot ahead of the other independently and hold 30 seconds    Standing on One Leg Able to lift leg independently and hold equal to or more than 3 seconds    Total Score 50    Berg comment: 50/56 = moderate fall risk            5x sit <> stand: 16.3 seconds, 14.8 seconds    GAIT: Gait pattern: decreased step length- Right, decreased stance time- Left, decreased stride length, trunk flexed, and poor foot clearance- Left, L knee instability when fatigued  Distance walked: Rollator into clinic mod I, small distances in clinic with no AD with SBA  Level of assistance: SBA Comments: Pt able to ambulate in and out of clinic today with rollator   Access Code: [email or web address] URL: [email or web address] Date: 03/23/2023 Prepared by: Sherlie Ban  Exercises - Sit to Stand  - 1 x daily - 7 x weekly - 3 sets - 5 reps - Standing Forward Step Taps with Counter Support  - 1 x daily - 7 x weekly - 3 sets - 10 reps - Mini Squat with Counter Support  - 1 x daily - 7 x weekly - 3 sets - 10 reps  Below exercises added to HEP for balance when pt having a good day, pt able to perform well with intermittent UE support as needed  - Standing Single Leg Stance with Counter Support  - 1 x daily - 5 x weekly - 3 sets - 10-15 hold - Standing Marching  - 1 x daily - 5 x weekly - 3 sets  PATIENT EDUCATION: Education details: Results of goals, information about LY[employer] yoga program, balance additions to HEP when pt having a good day  Person educated: Patient and Spouse Education method: Explanation, Demonstration, and Handouts Education comprehension: verbalized understanding and needs further education  HOME EXERCISE PROGRAM: Access Code: [email or web address] URL: [email or web address] Date: 03/23/2023 Prepared by: Sherlie Ban  Exercises - Sit  to Stand  - 1 x daily - 7 x weekly - 3 sets - 5 reps - Standing Forward Step Taps with Counter Support   - 1 x daily - 7 x weekly - 3 sets - 10 reps - Mini Squat with Counter Support  - 1 x daily - 7 x weekly - 3 sets - 10 reps - Standing Single Leg Stance with Counter Support  - 1 x daily - 5 x weekly - 3 sets - 10-15 hold - Standing Marching  - 1 x daily - 5 x weekly - 3 sets  GOALS: Goals reviewed with patient? Yes  SHORT TERM GOALS: Target date: 02/23/2023  Pt will be independent with initial HEP with family supervision in order to build upon functional gains made in therapy. Baseline: Goal status: MET  2.  Pt and pt's family will verbalize understanding on fall prevention in the home.  Baseline:  Goal status: IN PROGRESS  3.  Pt will improve Berg score to 6/56 for decreased fall risk Baseline: 2/56 (7/2), 12/56 (7/23) Goal status: MET  4. Pt will improve 5x sit<>stand to less than or equal to 20 sec with no UE support to demonstrate improved functional strength and transfer efficiency.  Baseline: 24.5 seconds with no UE support, 16.13 sec (7/23) Goal status: MET  5.  Pt will improve gait speed with RW to at least 1.2 ft/sec in order to demo decr fall risk.  Baseline: 44.22 seconds with RW = .74 ft/sec, pt needing one brief standing rest break due to LLE weakness and felt like LLE could buckle, 1.3 ft/sec with RW (7/23) Goal status: MET  6.  Pt will be able to stand for 5 minutes with supervision in order to improve standing tolerance for ADLs.  Baseline: Pt's spouse states pt can not stand for too long due to fatigue/weakness, pt unable to consistently stand for 5 min without LOB to the L  Goal status: IN PROGRESS  LONG TERM GOALS: Target date: 03/23/2023  Pt will be independent with final HEP with family supervision in order to build upon functional gains made in therapy. Baseline:  Goal status: INITIAL  2.  Pt will improve Berg score to 10/56 for decreased fall risk Baseline: 2/56 (7/2); 50/56 on 8/20 Goal status: MET  3.  Pt will improve gait speed with RW vs. LRAD to  at least 1.8 ft/sec in order to demo improved household mobility/decr fall risk  Baseline: 44.22 seconds with RW = .74 ft/sec, pt needing one brief standing rest break due to LLE weakness and felt like LLE could buckle Goal status: INITIAL  4.  Pt will ambulate at least 400' with LRAD and supervision (over indoor/outdoor paved surfaces) in order to demo improved household/community mobility.  Baseline:  24.5 seconds with no UE support Goal status: INITIAL  5.  Pt will improve 5x sit<>stand to less than or equal to 16 sec with no UE support to demonstrate improved functional strength and transfer efficiency.  Baseline: 14.8 seconds (8/20) Goal status: MET    ASSESSMENT:  CLINICAL IMPRESSION: Pt's BP WNL and no episodes of lightheadedness and dizziness. Pt saw PCP yesterday and was instructed to drink more water, wear compression socks during the day as needed (pt not wearing today) and decr flomax to 1x a day. Began to assess pt's LTGs with pt meeting LTGs #2 and #5. Pt with SIGNIFICANT improvement in BERG score, incr from 12/56 > 50/56. Pt also met LTG in regards to 5x sit <>  stand. Pt has made excellent progress so far, esp when BP is better managed and pt has better balance. Will continue per POC.  OBJECTIVE IMPAIRMENTS: Abnormal gait, decreased activity tolerance, decreased balance, decreased coordination, decreased endurance, decreased knowledge of use of DME, decreased mobility, difficulty walking, decreased ROM, decreased strength, and postural dysfunction.   ACTIVITY LIMITATIONS: carrying, lifting, bending, standing, stairs, transfers, and locomotion level  PARTICIPATION LIMITATIONS: cleaning, driving, shopping, community activity, and yard work  PERSONAL FACTORS: Age, Behavior pattern, Past/current experiences, Time since onset of injury/illness/exacerbation, and 3+ comorbidities: CAD, HTN, TIA, anxiety, BPPV, HLD, gout, prostate CA, CABG and stents  are also affecting patient's  functional outcome.   REHAB POTENTIAL: Good  CLINICAL DECISION MAKING: Evolving/moderate complexity  EVALUATION COMPLEXITY: Moderate  PLAN:  PT FREQUENCY: 2x/week  PT DURATION: 8 weeks  PLANNED INTERVENTIONS: Therapeutic exercises, Therapeutic activity, Neuromuscular re-education, Balance training, Gait training, Patient/Family education, Self Care, Joint mobilization, Stair training, Orthotic/Fit training, DME instructions, Aquatic Therapy, Manual therapy, and Re-evaluation  PLAN FOR NEXT SESSION:  CHECK BP!!! Update HEP and add hip strength. Finish checking goals and re-cert. Update goals to with no AD vs. SPC   Work on gait with cane. Work on [employer], standing balance, functional strength, SLS activities.   Sherlie Ban, PT, DPT 03/23/23 4:34 PM

## 2023-03-24 NOTE — Assessment & Plan Note (Signed)
With labile blood pressures noted.  Goal to limit hypotension and limit significant blood pressure elevation.  Discussed.  Discussed that his blood pressure range tolerance may be lower compared to prior. Drink enough water to keep your urine clear or light.   If needed try cutting flomax back to 1 pill a day for a few days and see if you feel better (stronger walking without worsening urine stream).  Try compression stockings in the daytime.   Update me in about 1 week by phone.    See notes on labs.

## 2023-03-26 ENCOUNTER — Ambulatory Visit: Payer: Medicare Other | Admitting: Physical Therapy

## 2023-03-26 VITALS — BP 166/91 | HR 58

## 2023-03-26 DIAGNOSIS — I69354 Hemiplegia and hemiparesis following cerebral infarction affecting left non-dominant side: Secondary | ICD-10-CM | POA: Diagnosis not present

## 2023-03-26 DIAGNOSIS — M6281 Muscle weakness (generalized): Secondary | ICD-10-CM | POA: Diagnosis not present

## 2023-03-26 DIAGNOSIS — R2689 Other abnormalities of gait and mobility: Secondary | ICD-10-CM

## 2023-03-26 DIAGNOSIS — Z9181 History of falling: Secondary | ICD-10-CM

## 2023-03-26 DIAGNOSIS — R2681 Unsteadiness on feet: Secondary | ICD-10-CM

## 2023-03-26 DIAGNOSIS — I639 Cerebral infarction, unspecified: Secondary | ICD-10-CM | POA: Diagnosis not present

## 2023-03-26 NOTE — Therapy (Signed)
OUTPATIENT PHYSICAL THERAPY NEURO TREATMENT - RECERT***  Patient Name: Cayton Dafoe. MRN: [id number] DOB:March 13, 1951, 72 y.o., male 12 Date: 03/26/2023   PCP: Joaquim Nam, MD  REFERRING PROVIDER: Merlene Laughter, DO     END OF SESSION:  PT End of Session - 03/26/23 1320     Visit Number 16    Number of Visits 24   recert   Date for PT Re-Evaluation 05/07/23    Authorization Type Medicare -Part A & B    PT Start Time 1315    PT Stop Time 1400    PT Time Calculation (min) 45 min    Equipment Utilized During Treatment Gait belt    Activity Tolerance Patient tolerated treatment well    Behavior During Therapy [facility] for tasks assessed/performed                  Past Medical History:  Diagnosis Date   Anxiety state 03/16/2016   Aphasia 01/26/2017   Benign localized prostatic hyperplasia with lower urinary tract symptoms (LUTS)    BPV (benign positional vertigo) 11/02/2014   Carotid artery stenosis    PER DUPLEX 10-22-2015  BILATERAL ICA 1-39%   Cataract    Coronary artery disease    CABG 2000 with LIMA> LAD, SVG > RCA and free radial > OM. 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   Gout    Gout 07/18/2010   Qualifier: Diagnosis of  By: Para March MD, Cheree Ditto     H/O eye injury    chronic changes to left eye after injury   Hand weakness 03/02/2018   Hearing loss 04/21/2013   History of TIA (transient ischemic attack)    03/ 2017  no residual after brief episode loss peripheral vision   HOH (hard of hearing)    Hyperglycemia 05/17/2016   Hyperlipemia    Hyperlipidemia 07/18/2010   Qualifier: Diagnosis of  By: Para March MD, Graham     Hypertension     HYPERTENSION, BENIGN ESSENTIAL 07/18/2010   Qualifier: Diagnosis of  By: Para March MD, Graham     Hypokalemia 01/26/2017   Knee pain 03/13/2019   Left shoulder pain 09/04/2018   Pneumothorax 10/11/2019   RIGHT    Prostate cancer (HCC) UROLOGIST-  DR GRAPEY/  ONCOLOGIST-  DR MANNING   dx 2015--- Stage T1c, Gleason 3+4, PSA 4.03, vol 44cc   PVC's (premature ventricular contractions)    Rash and nonspecific skin eruption 10/14/2015   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   S/P CABG x 05 Dec 1998   Skin lesion 10/14/2015   Stroke [facility])    TIA (transient ischemic attack) 10/14/2015   Ventral hernia 10/26/2011   Wears glasses    Past Surgical History:  Procedure Laterality Date   APPENDECTOMY  1975   CARDIAC CATHETERIZATION  12-22-2001   dr Verdis Prime   severe native vessel disease mLAD 60-70%,  total occulsion pCFX  and pRCA/  widely patent saphenous vein , free radial , and LIMA grafts/  minminal lv dysfunction, ef 60%   CHEST TUBE INSERTION Right 10/11/2019   CORONARY ARTERY BYPASS GRAFT  12/1998   LIMA to LAD,  SVG to  PDA and Diagonal, Free radial graft to OM   CORONARY STENT INTERVENTION N/A 05/26/2021   Procedure: CORONARY STENT INTERVENTION;  Surgeon: Orbie Pyo, MD;  Location: [facility] INVASIVE CV LAB;  Service: Cardiovascular;  Laterality: N/A;   Exericse treadmill test  last one 01-03-2014  dr Verdis Prime   normal exercise tolerance w/ hypertensive repsonse,  no ischemic EKG changes, appropriate HR response & recovery ([facility] TM score 9;  Low Risk , PVC's w/ exertion)   EYE SURGERY     FRACTURE SURGERY     LEFT HEART CATH AND CORS/GRAFTS ANGIOGRAPHY N/A 05/26/2021   Procedure: LEFT HEART CATH AND CORS/GRAFTS ANGIOGRAPHY;  Surgeon: Orbie Pyo, MD;  Location: [facility] INV[facility] CV [facility];  Service: Cardiovascular;  Laterality: N/A;   LOOP RECORDER INSERTION N/A 12/22/2022   Procedure: LOOP RECORDER INSERTION;  Surgeon: Marinus Maw, MD;  Location: [facility] INV[facility] CV [facility];   Service: Cardiovascular;  Laterality: N/A;   PROSTATE BIOPSY     RADIOACTIVE SEED IMPLANT N/A 01/13/2016   Procedure: RADIOACTIVE SEED IMPLANT/BRACHYTHERAPY IMPLANT;  Surgeon: Barron Alvine, MD;  Location: [facility];  Service: Urology;  Laterality: N/A;   Patient Active Problem List   Diagnosis Date Noted   Acute CVA (cerebrovascular accident) (HCC) 12/22/2022   CVA (cerebral vascular accident) (HCC) 12/22/2022   Constipation 03/04/2022   CAD (coronary artery disease) 05/24/2021   NSTEMI (non-ST elevated myocardial infarction) (HCC) 05/24/2021   Pneumothorax 10/11/2019   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   Knee joint symptom 03/13/2019   Left shoulder pain 09/04/2018   Diabetes mellitus without complication (HCC) 03/02/2018   Anxiety state 03/16/2016   Pseudophakia of both eyes 02/24/2016   TIA (transient ischemic attack) 10/14/2015   BPV (benign positional vertigo) 11/02/2014   Advance care planning 05/13/2014   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   Hearing loss 04/21/2013   Ventral hernia 10/26/2011   Medicare annual wellness visit, subsequent 10/26/2011   Prostate cancer (HCC) 10/26/2011   Coronary atherosclerosis 07/19/2010   GERD 07/19/2010   Hyperlipidemia 07/18/2010   Gout 07/18/2010   Hypertension 07/18/2010    ONSET DATE: 12/22/2022  REFERRING DIAG: I63.9 (ICD-10-CM) - Acute CVA (cerebrovascular accident) (HCC)  THERAPY DIAG:  Unsteadiness on feet  Other abnormalities of gait and mobility  Muscle weakness (generalized)  History of falling  Acute CVA (cerebrovascular accident) (HCC)  Rationale for Evaluation and Treatment: Rehabilitation  SUBJECTIVE:  SUBJECTIVE STATEMENT:  Pt enters clinic without an AD today! Uses his wife's hand for some  support, no falls since last visit. Pt and his wife are driving to [place] next week to spend a week there.  Pt accompanied by:  [name]   PERTINENT HISTORY: Pt is 72 yo male who presented on 12/21/22 with intermittent L leg weakness past 3 weeks. 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 Pt discharged on 12/22/22. Pt with hx including but not limited to CAD, HTN, TIA, anxiety, BPPV, HLD, gout, prostate CA, CABG and stents.  Pt also hard of hearing.   Vitals:   03/26/23 1324  BP: (!) 166/91  Pulse: (!) 58       PAIN:  Are you having pain? No  PRECAUTIONS: Fall  WEIGHT BEARING RESTRICTIONS: No  FALLS: Has patient fallen in last 6 months? Yes. Number of falls 5, denies injury, just some bruises   LIVING ENVIRONMENT: Lives with: lives with their spouse and has family closeby and grand son is always home  Lives in: House/apartment Stairs: Yes: Internal: 12 steps; on right going up, level entry into house  Has following equipment at home: Dan Humphreys - 2 wheeled, Environmental consultant - 4 wheeled, shower chair, and Uses standard 2 wheeled RW, uses rollator out in the community as he can sit in it.   PLOF: Independent and Leisure: enjoys biking and his riding his UTV  PATIENT GOALS: Wants to build more strength in L leg  OBJECTIVE:   DIAGNOSTIC FINDINGS: MRI brain wo contrast 12/21/2022: Intermediate sized area of acute ischemia within the right anterior cerebral artery territory. No hemorrhage or mass effect.   COGNITION: Overall cognitive status: Within functional limits for tasks assessed   SENSATION: Light touch: WFL  Reports some tingling in feet at night.   COORDINATION: Heel to shin: slightly slower with LLE   RAM with BUE: impaired coordination with LUE   POSTURE: rounded shoulders, forward head, and weight shift right  LOWER EXTREMITY ROM:     Decr LLE knee extension AROM due to weakness  LOWER EXTREMITY MMT:    MMT Right Eval  Left Eval  Hip flexion 5 4+  Hip extension    Hip abduction 5 5  Hip adduction 5 5  Hip internal rotation    Hip external rotation    Knee flexion 5 5  Knee extension 5 3  Ankle dorsiflexion 5 4  Ankle plantarflexion    Ankle inversion    Ankle eversion    (Blank rows = not tested)  All tested in sitting.    TODAY'S TREATMENT:                                                                                                                               Therapeutic Activity:   Seated BP assessed at beginning of session. Some difficulty obtaining an accurate reading due to patient fidgeting, pt remains  asymptomatic of any BP changes throughout session.  Vitals:   03/26/23 1324  BP: (!) 166/91  Pulse: (!) 58    For LTG assessment:  [facility] PT Assessment - 03/26/23 1330       Ambulation/Gait   Gait velocity 32.8 ft over 13.41 sec = 2.44 ft/sec             Static standing balance on airex while performing ball toss 2 x 30 reps with CGA Added in dual cog task naming animals, food, etc Pt exhibits decreased attention to task and decreased speed of performance with addition of cog task  To work on dynamic standing balance and navigation of functional environments: Ambulation through obstacle course navigating over foam beams, 4" hurdles, and performing alt L/R gumdrop taps with CGA for balance, pt progresses from step-to to step-through gait pattern   GAIT:  Gait pattern: decreased hip/knee flexion- Right and decreased hip/knee flexion- Left Distance walked: 1000 ft Assistive device utilized: None Level of assistance: CGA Comments: gait outdoors across uneven sidewalk, up/down inclines, and across grass; onset of hip fatigue with gait but no LOB noted  Pt also ambulates in/out of therapy clinic with no AD this date at [facility] level.   PATIENT EDUCATION: Education details: Results of goals, PT POC Person educated: Patient and Spouse Education method: Software engineer Education comprehension: verbalized understanding and needs further education  HOME EXERCISE PROGRAM: Access Code: [email or web address] URL: [email or web address] Date: 03/23/2023 Prepared by: Sherlie Ban  Exercises - Sit to Stand  - 1 x daily - 7 x weekly - 3 sets - 5 reps - Standing Forward Step Taps with Counter Support  - 1 x daily - 7 x weekly - 3 sets - 10 reps - Mini Squat with Counter Support  - 1 x daily - 7 x weekly - 3 sets - 10 reps  Below exercises added to HEP for balance when pt having a good day, pt able to perform well with intermittent UE support as needed  - Standing Single Leg Stance with Counter Support  - 1 x daily - 5 x weekly - 3 sets - 10-15 hold - Standing Marching  - 1 x daily - 5 x weekly - 3 sets  GOALS: Goals reviewed with patient? Yes  SHORT TERM GOALS: Target date: 02/23/2023  Pt will be independent with initial HEP with family supervision in order to build upon functional gains made in therapy. Baseline: Goal status: MET  2.  Pt and pt's family will verbalize understanding on fall prevention in the home.  Baseline:  Goal status: IN PROGRESS  3.  Pt will improve Berg score to 6/56 for decreased fall risk Baseline: 2/56 (7/2), 12/56 (7/23) Goal status: MET  4. Pt will improve 5x sit<>stand to less than or equal to 20 sec with no UE support to demonstrate improved functional strength and transfer efficiency.  Baseline: 24.5 seconds with no UE support, 16.13 sec (7/23) Goal status: MET  5.  Pt will improve gait speed with RW to at least 1.2 ft/sec in order to demo decr fall risk.  Baseline: 44.22 seconds with RW = .74 ft/sec, pt needing one brief standing rest break due to LLE weakness and felt like LLE could buckle, 1.3 ft/sec with RW (7/23) Goal status: MET  6.  Pt will be able to stand for 5 minutes with supervision in order to improve standing tolerance for ADLs.  Baseline: Pt's spouse states pt can not stand for too long  due  to fatigue/weakness, pt unable to consistently stand for 5 min without LOB to the L  Goal status: IN PROGRESS  LONG TERM GOALS: Target date: 03/23/2023  Pt will be independent with final HEP with family supervision in order to build upon functional gains made in therapy. Baseline:  Goal status: IN PROGRESS  2.  Pt will improve Berg score to 10/56 for decreased fall risk Baseline: 2/56 (7/2); 50/56 on 8/20 Goal status: MET  3.  Pt will improve gait speed with RW vs. LRAD to at least 1.8 ft/sec in order to demo improved household mobility/decr fall risk  Baseline: 44.22 seconds with RW = .74 ft/sec, pt needing one brief standing rest break due to LLE weakness and felt like LLE could buckle, 2.44 ft/sec no AD (8/23) Goal status: MET  4.  Pt will ambulate at least 400' with LRAD and supervision (over indoor/outdoor paved surfaces) in order to demo improved household/community mobility.  Baseline:  24.5 seconds with no UE support; 1000 ft outdoors with CGA (8/23) Goal status: IN PROGRESS  5.  Pt will improve 5x sit<>stand to less than or equal to 16 sec with no UE support to demonstrate improved functional strength and transfer efficiency.  Baseline: 14.8 seconds (8/20) Goal status: MET  NEW SHORT TERM GOALS=LONG TERM GOALS due to length of POC   NEW LONG TERM GOALS:  Target date: 04/30/2023  Pt will be independent with final HEP for improved strength, balance, transfers and gait. Baseline:  Goal status: IN PROGRESS  2.  FGA to be assessed and LTG set Baseline: to be assessed Goal status: INITIAL  3.  Pt will improve gait velocity to at least 3.0 ft/sec for improved gait efficiency and performance at Supervision level  Baseline: 2.44 ft/sec no AD with CGA (8/23) Goal status: INITIAL    ASSESSMENT:  CLINICAL IMPRESSION: Emphasis of skilled PT session on assessing LTG and writing new LTG in order for recertification of PT services this date. Pt has met 3/5 LTG due to  improving his score on the Berg from 2/56 to 50/56, improved his gait speed from 0.74 ft/sec to 2.44 ft/sec, and  improved his 5xSTS score to 14.8 sec from 24.5 sec initially.*** Continue POC.   OBJECTIVE IMPAIRMENTS: Abnormal gait, decreased activity tolerance, decreased balance, decreased coordination, decreased endurance, decreased knowledge of use of DME, decreased mobility, difficulty walking, decreased ROM, decreased strength, and postural dysfunction.   ACTIVITY LIMITATIONS: carrying, lifting, bending, standing, stairs, transfers, and locomotion level  PARTICIPATION LIMITATIONS: cleaning, driving, shopping, community activity, and yard work  PERSONAL FACTORS: Age, Behavior pattern, Past/current experiences, Time since onset of injury/illness/exacerbation, and 3+ comorbidities: CAD, HTN, TIA, anxiety, BPPV, HLD, gout, prostate CA, CABG and stents  are also affecting patient's functional outcome.   REHAB POTENTIAL: Good  CLINICAL DECISION MAKING: Evolving/moderate complexity  EVALUATION COMPLEXITY: Moderate  PLAN:  PT FREQUENCY: 2x/week  PT DURATION: 8 weeks  PLANNED INTERVENTIONS: Therapeutic exercises, Therapeutic activity, Neuromuscular re-education, Balance training, Gait training, Patient/Family education, Self Care, Joint mobilization, Stair training, Orthotic/Fit training, DME instructions, Aquatic Therapy, Manual therapy, and Re-evaluation  PLAN FOR NEXT SESSION:  CHECK BP!!! Update HEP and add hip strength. Finish checking goals and re-cert. Update goals to with no AD vs. SPC   Work on gait with cane. Work on [employer], standing balance, functional strength, SLS activities. ***  Assess FGA and write LTG, ***  Peter Congo, PT, DPT, CSRS  03/26/23 2:06 PM

## 2023-03-30 ENCOUNTER — Ambulatory Visit: Payer: Medicare Other | Admitting: Physical Therapy

## 2023-04-02 ENCOUNTER — Ambulatory Visit: Payer: Medicare Other | Admitting: Physical Therapy

## 2023-04-06 ENCOUNTER — Ambulatory Visit: Payer: Medicare Other | Admitting: Physical Therapy

## 2023-04-07 ENCOUNTER — Encounter: Payer: Self-pay | Admitting: Physical Therapy

## 2023-04-07 ENCOUNTER — Ambulatory Visit: Payer: Medicare Other | Attending: Family Medicine | Admitting: Physical Therapy

## 2023-04-07 VITALS — BP 152/93 | HR 65

## 2023-04-07 DIAGNOSIS — M6281 Muscle weakness (generalized): Secondary | ICD-10-CM | POA: Insufficient documentation

## 2023-04-07 DIAGNOSIS — R2689 Other abnormalities of gait and mobility: Secondary | ICD-10-CM | POA: Diagnosis not present

## 2023-04-07 DIAGNOSIS — Z9181 History of falling: Secondary | ICD-10-CM | POA: Diagnosis not present

## 2023-04-07 DIAGNOSIS — R2681 Unsteadiness on feet: Secondary | ICD-10-CM | POA: Diagnosis not present

## 2023-04-07 NOTE — Therapy (Signed)
OUTPATIENT PHYSICAL THERAPY NEURO TREATMENT   Patient Name: Tyler Guerra. MRN: [id number] DOB:1951-02-14, 72 y.o., male Today's Date: 04/07/2023   PCP: Joaquim Nam, MD  REFERRING PROVIDER: Merlene Laughter, DO     END OF SESSION:  PT End of Session - 04/07/23 1618     Visit Number 17    Number of Visits 24   recert   Date for PT Re-Evaluation 05/07/23    Authorization Type Medicare -Part A & B    PT Start Time 1616    PT Stop Time 1658    PT Time Calculation (min) 42 min    Equipment Utilized During Treatment Gait belt    Activity Tolerance Patient tolerated treatment well    Behavior During Therapy WFL for tasks assessed/performed                  Past Medical History:  Diagnosis Date   Anxiety state 03/16/2016   Aphasia 01/26/2017   Benign localized prostatic hyperplasia with lower urinary tract symptoms (LUTS)    BPV (benign positional vertigo) 11/02/2014   Carotid artery stenosis    PER DUPLEX 10-22-2015  BILATERAL ICA 1-39%   Cataract    Coronary artery disease    CABG 2000 with LIMA> LAD, SVG > RCA and free radial > OM. 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   Gout    Gout 07/18/2010   Qualifier: Diagnosis of  By: Para March MD, Cheree Ditto     H/O eye injury    chronic changes to left eye after injury   Hand weakness 03/02/2018   Hearing loss 04/21/2013   History of TIA (transient ischemic attack)    03/ 2017  no residual after brief episode loss peripheral vision   HOH (hard of hearing)    Hyperglycemia 05/17/2016   Hyperlipemia    Hyperlipidemia 07/18/2010   Qualifier: Diagnosis of  By: Para March MD, Graham     Hypertension    HYPERTENSION,  BENIGN ESSENTIAL 07/18/2010   Qualifier: Diagnosis of  By: Para March MD, Graham     Hypokalemia 01/26/2017   Knee pain 03/13/2019   Left shoulder pain 09/04/2018   Pneumothorax 10/11/2019   RIGHT    Prostate cancer (HCC) UROLOGIST-  DR GRAPEY/  ONCOLOGIST-  DR MANNING   dx 2015--- Stage T1c, Gleason 3+4, PSA 4.03, vol 44cc   PVC's (premature ventricular contractions)    Rash and nonspecific skin eruption 10/14/2015   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   S/P CABG x 05 Dec 1998   Skin lesion 10/14/2015   Stroke [facility])    TIA (transient ischemic attack) 10/14/2015   Ventral hernia 10/26/2011   Wears glasses    Past Surgical History:  Procedure Laterality Date   APPENDECTOMY  1975   CARDIAC CATHETERIZATION  12-22-2001   dr Verdis Prime   severe native vessel disease mLAD 60-70%,  total occulsion pCFX  and pRCA/  widely patent saphenous vein , free radial , and LIMA grafts/  minminal lv dysfunction, ef 60%   CHEST TUBE INSERTION Right 10/11/2019   CORONARY ARTERY BYPASS GRAFT  12/1998   LIMA to LAD,  SVG to PDA  and Diagonal, Free radial graft to OM   CORONARY STENT INTERVENTION N/A 05/26/2021   Procedure: CORONARY STENT INTERVENTION;  Surgeon: Orbie Pyo, MD;  Location: [facility];  Service: Cardiovascular;  Laterality: N/A;   Exericse treadmill test  last one 01-03-2014  dr Verdis Prime   normal exercise tolerance w/ hypertensive repsonse,  no ischemic EKG changes, appropriate HR response & recovery ([facility] TM score 9;  Low Risk , PVC's w/ exertion)   EYE SURGERY     FRACTURE SURGERY     LEFT HEART CATH AND CORS/GRAFTS ANGIOGRAPHY N/A 05/26/2021   Procedure: LEFT HEART CATH AND CORS/GRAFTS ANGIOGRAPHY;  Surgeon: Orbie Pyo, MD;  Location: [facility];  Service: Cardiovascular;  Laterality: N/A;   LOOP RECORDER INSERTION N/A 12/22/2022   Procedure: LOOP RECORDER INSERTION;  Surgeon: Marinus Maw, MD;  Location: [facility];  Service:  Cardiovascular;  Laterality: N/A;   PROSTATE BIOPSY     RADIOACTIVE SEED IMPLANT N/A 01/13/2016   Procedure: RADIOACTIVE SEED IMPLANT/BRACHYTHERAPY IMPLANT;  Surgeon: Barron Alvine, MD;  Location: [facility];  Service: Urology;  Laterality: N/A;   Patient Active Problem List   Diagnosis Date Noted   Acute CVA (cerebrovascular accident) (HCC) 12/22/2022   CVA (cerebral vascular accident) (HCC) 12/22/2022   Constipation 03/04/2022   CAD (coronary artery disease) 05/24/2021   NSTEMI (non-ST elevated myocardial infarction) (HCC) 05/24/2021   Pneumothorax 10/11/2019   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   Knee joint symptom 03/13/2019   Left shoulder pain 09/04/2018   Diabetes mellitus without complication (HCC) 03/02/2018   Anxiety state 03/16/2016   Pseudophakia of both eyes 02/24/2016   TIA (transient ischemic attack) 10/14/2015   BPV (benign positional vertigo) 11/02/2014   Advance care planning 05/13/2014   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   Hearing loss 04/21/2013   Ventral hernia 10/26/2011   Medicare annual wellness visit, subsequent 10/26/2011   Prostate cancer (HCC) 10/26/2011   Coronary atherosclerosis 07/19/2010   GERD 07/19/2010   Hyperlipidemia 07/18/2010   Gout 07/18/2010   Hypertension 07/18/2010    ONSET DATE: 12/22/2022  REFERRING DIAG: I63.9 (ICD-10-CM) - Acute CVA (cerebrovascular accident) (HCC)  THERAPY DIAG:  Unsteadiness on feet  Other abnormalities of gait and mobility  Muscle weakness (generalized)  Rationale for Evaluation and Treatment: Rehabilitation  SUBJECTIVE:                                                                                                                                                                                             SUBJECTIVE STATEMENT:  Still not using any AD.  Has been working on walking backwards. Has not had to use any AD. [place] was good and hot. Brought his cane just in case but  did not have to use it. No falls. BP has also been decent. Was able to go upstairs to sleep upstairs last night.   Pt accompanied by:  [name]   PERTINENT HISTORY: Pt is 72 yo male who presented on 12/21/22 with intermittent L leg weakness past 3 weeks. 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 Pt discharged on 12/22/22. Pt with hx including but not limited to CAD, HTN, TIA, anxiety, BPPV, HLD, gout, prostate CA, CABG and stents.  Pt also hard of hearing.   Vitals:   04/07/23 1629  BP: (!) 152/93  Pulse: 65    PAIN:  Are you having pain? No  PRECAUTIONS: Fall  WEIGHT BEARING RESTRICTIONS: No  FALLS: Has patient fallen in last 6 months? Yes. Number of falls 5, denies injury, just some bruises   LIVING ENVIRONMENT: Lives with: lives with their spouse and has family closeby and grand son is always home  Lives in: House/apartment Stairs: Yes: Internal: 12 steps; on right going up, level entry into house  Has following equipment at home: Dan Humphreys - 2 wheeled, Environmental consultant - 4 wheeled, shower chair, and Uses standard 2 wheeled RW, uses rollator out in the community as he can sit in it.   PLOF: Independent and Leisure: enjoys biking and his riding his UTV  PATIENT GOALS: Wants to build more strength in L leg  OBJECTIVE:   DIAGNOSTIC FINDINGS: MRI brain wo contrast 12/21/2022: Intermediate sized area of acute ischemia within the right anterior cerebral artery territory. No hemorrhage or mass effect.   COGNITION: Overall cognitive status: Within functional limits for tasks assessed   SENSATION: Light touch: WFL  Reports some tingling in feet at night.   COORDINATION: Heel to shin: slightly slower with LLE   RAM with BUE: impaired coordination with LUE   POSTURE: rounded shoulders, forward head, and weight shift right  LOWER EXTREMITY ROM:     Decr LLE knee extension AROM due to weakness  LOWER EXTREMITY MMT:    MMT Right Eval Left Eval   Hip flexion 5 4+  Hip extension    Hip abduction 5 5  Hip adduction 5 5  Hip internal rotation    Hip external rotation    Knee flexion 5 5  Knee extension 5 3  Ankle dorsiflexion 5 4  Ankle plantarflexion    Ankle inversion    Ankle eversion    (Blank rows = not tested)  All tested in sitting.    TODAY'S TREATMENT:                                                                                                                               Therapeutic Activity:   Seated BP assessed at beginning of session. Pt's spouse reports that  diastolic has been in the 80s and 90s at home and plans to reach out to PCP regarding this.   Vitals:   04/07/23 1629  BP: (!) 152/93  Pulse: 65     [facility] PT Assessment - 04/07/23 1627       Functional Gait  Assessment   Gait assessed  Yes    Gait Level Surface Walks 20 ft in less than 7 sec but greater than 5.5 sec, uses assistive device, slower speed, mild gait deviations, or deviates 6-10 in outside of the 12 in walkway width.   6.8 seconds   Change in Gait Speed Able to change speed, demonstrates mild gait deviations, deviates 6-10 in outside of the 12 in walkway width, or no gait deviations, unable to achieve a major change in velocity, or uses a change in velocity, or uses an assistive device.    Gait with Horizontal Head Turns Performs head turns smoothly with no change in gait. Deviates no more than 6 in outside 12 in walkway width    Gait with Vertical Head Turns Performs head turns with no change in gait. Deviates no more than 6 in outside 12 in walkway width.    Gait and Pivot Turn Pivot turns safely within 3 sec and stops quickly with no loss of balance.    Step Over Obstacle Is able to step over 2 stacked shoe boxes taped together (9 in total height) without changing gait speed. No evidence of imbalance.    Gait with Narrow Base of Support Ambulates 7-9 steps.    Gait with Eyes Closed Walks 20 ft, no assistive devices, good speed, no  evidence of imbalance, normal gait pattern, deviates no more than 6 in outside 12 in walkway width. Ambulates 20 ft in less than 7 sec.   6.84   Ambulating Backwards Walks 20 ft, slow speed, abnormal gait pattern, evidence for imbalance, deviates 10-15 in outside 12 in walkway width.   21.22 seconds   Steps Alternating feet, no rail.    Total Score 25    FGA comment: 25/30 = Low Fall Risk             Access Code: [email or web address] URL: [email or web address] Date: 04/07/2023 Prepared by: Sherlie Ban  Added bolded exercises below to HEP:   Exercises - Sit to Stand  - 1 x daily - 7 x weekly - 3 sets - 10 reps - Mini Squat with Counter Support  - 1 x daily - 7 x weekly - 3 sets - 10 reps - Standing Single Leg Stance with Counter Support  - 1 x daily - 5 x weekly - 3 sets - 10-15 hold - Standing Marching  - 1 x daily - 5 x weekly - 3 sets - Tandem Walking with Counter Support  - 1-2 x daily - 5 x weekly - 3 sets - focus on slowed pace, going forwards and backwards  - Side Stepping with Resistance at [employer] and Counter Support  - 1-2 x daily - 5 x weekly - 3 sets - with blue theraband around thighs, cues for mini squat position   GAIT:  Gait pattern: decreased hip/knee flexion- Right and decreased hip/knee flexion- Left Distance walked: Clinic distances  Assistive device utilized: None Level of assistance: SBA Comments: Pt also ambulates in/out of therapy clinic with no AD with supervision    PATIENT EDUCATION: Education details: Results of FGA, additions to HEP for balance/hip strength Person educated: Patient and Spouse Education method: Explanation, Demonstration, Verbal cues,  and Handouts Education comprehension: verbalized understanding, returned demonstration, and needs further education  HOME EXERCISE PROGRAM: Access Code: [email or web address] URL: [email or web address] Date: 04/07/2023 Prepared by: Sherlie Ban  Exercises - Sit to Stand  - 1 x daily -  7 x weekly - 3 sets - 10 reps - Mini Squat with Counter Support  - 1 x daily - 7 x weekly - 3 sets - 10 reps - Standing Single Leg Stance with Counter Support  - 1 x daily - 5 x weekly - 3 sets - 10-15 hold - Standing Marching  - 1 x daily - 5 x weekly - 3 sets - Tandem Walking with Counter Support  - 1-2 x daily - 5 x weekly - 3 sets - Side Stepping with Resistance at Thighs and Counter Support  - 1-2 x daily - 5 x weekly - 3 sets  GOALS: Goals reviewed with patient? Yes   LONG TERM GOALS: Target date: 03/23/2023  Pt will be independent with final HEP with family supervision in order to build upon functional gains made in therapy. Baseline:  Goal status: IN PROGRESS  2.  Pt will improve Berg score to 10/56 for decreased fall risk Baseline: 2/56 (7/2); 50/56 on 8/20 Goal status: MET  3.  Pt will improve gait speed with RW vs. LRAD to at least 1.8 ft/sec in order to demo improved household mobility/decr fall risk  Baseline: 44.22 seconds with RW = .74 ft/sec, pt needing one brief standing rest break due to LLE weakness and felt like LLE could buckle, 2.44 ft/sec no AD (8/23) Goal status: MET  4.  Pt will ambulate at least 400' with LRAD and supervision (over indoor/outdoor paved surfaces) in order to demo improved household/community mobility.  Baseline:  24.5 seconds with no UE support; 1000 ft outdoors with CGA (8/23) Goal status: IN PROGRESS  5.  Pt will improve 5x sit<>stand to less than or equal to 16 sec with no UE support to demonstrate improved functional strength and transfer efficiency.  Baseline: 14.8 seconds (8/20) Goal status: MET  NEW SHORT TERM GOALS=LONG TERM GOALS due to length of POC   NEW LONG TERM GOALS:  Target date: 04/30/2023  Pt will be independent with final HEP for improved strength, balance, transfers and gait. Baseline:  Goal status: IN PROGRESS  2.  Pt will improve FGA to at least a 28/30 in order to demo decr fall risk.  Baseline: 25/30  Goal  status: INITIAL  3.  Pt will improve gait velocity to at least 3.0 ft/sec for improved gait efficiency and performance at Supervision level  Baseline: 2.44 ft/sec no AD with CGA (8/23) Goal status: INITIAL    ASSESSMENT:  CLINICAL IMPRESSION: Pt returns from [place] and is doing great! Did not use an AD at all since he was last seen and pt had no falls. Pt's diastolic slightly elevated today in the 90s with pt's spouse going to make PCP aware. Assessed FGA with pt scoring a 25/30, indicating a low fall risk, LTG updated as appropriate. Added tandem gait to HEP as well as side stepping with resistance for hip ABD strength. Pt able to ambulate all throughout session with supervision. Pt is making excellent progress, will continue per POC.    OBJECTIVE IMPAIRMENTS: Abnormal gait, decreased activity tolerance, decreased balance, decreased coordination, decreased endurance, decreased knowledge of use of DME, decreased mobility, difficulty walking, decreased ROM, decreased strength, and postural dysfunction.   ACTIVITY LIMITATIONS: carrying, lifting, bending, standing, stairs, transfers, and locomotion level  PARTICIPATION LIMITATIONS: cleaning, driving, shopping, community activity, and yard work  PERSONAL FACTORS: Age, Behavior pattern, Past/current experiences, Time since onset of injury/illness/exacerbation, and 3+ comorbidities: CAD, HTN, TIA, anxiety, BPPV, HLD, gout, prostate CA, CABG and stents  are also affecting patient's functional outcome.   REHAB POTENTIAL: Good  CLINICAL DECISION MAKING: Evolving/moderate complexity  EVALUATION COMPLEXITY: Moderate  PLAN:  PT FREQUENCY: 2x/week  PT DURATION: 8 weeks + 4 weeks (recert)  PLANNED INTERVENTIONS: Therapeutic exercises, Therapeutic activity, Neuromuscular re-education, Balance training, Gait training, Patient/Family education, Self Care, Joint mobilization, Stair training, Orthotic/Fit training, DME instructions, Aquatic Therapy,  Manual therapy, and Re-evaluation  PLAN FOR NEXT SESSION:  CHECK BP!!! Work on gait across [employer], Work on [employer], standing balance, functional strength (focus on hips), SLS tasks.    Sherlie Ban, PT, DPT 04/07/23 5:07 PM

## 2023-04-09 ENCOUNTER — Ambulatory Visit: Payer: Medicare Other | Admitting: Physical Therapy

## 2023-04-09 VITALS — BP 179/95 | HR 56

## 2023-04-09 DIAGNOSIS — R2681 Unsteadiness on feet: Secondary | ICD-10-CM | POA: Diagnosis not present

## 2023-04-09 DIAGNOSIS — M6281 Muscle weakness (generalized): Secondary | ICD-10-CM

## 2023-04-09 DIAGNOSIS — R2689 Other abnormalities of gait and mobility: Secondary | ICD-10-CM

## 2023-04-09 DIAGNOSIS — Z9181 History of falling: Secondary | ICD-10-CM

## 2023-04-09 NOTE — Therapy (Signed)
OUTPATIENT PHYSICAL THERAPY NEURO TREATMENT   Patient Name: Tyler Guerra. MRN: [id number] DOB:[birth year], 72 y.o., male Today's Date: 04/09/2023   PCP: Joaquim Nam, MD  REFERRING PROVIDER: Merlene Laughter, DO     END OF SESSION:  PT End of Session - 04/09/23 1322     Visit Number 18    Number of Visits 24   recert   Date for PT Re-Evaluation 05/07/23    Authorization Type Medicare -Part A & B    PT Start Time 1321   pt arrived late   PT Stop Time 1400    PT Time Calculation (min) 39 min    Equipment Utilized During Treatment Gait belt    Activity Tolerance Patient tolerated treatment well    Behavior During Therapy WFL for tasks assessed/performed                   Past Medical History:  Diagnosis Date   Anxiety state 03/16/2016   Aphasia 01/26/2017   Benign localized prostatic hyperplasia with lower urinary tract symptoms (LUTS)    BPV (benign positional vertigo) 11/02/2014   Carotid artery stenosis    PER DUPLEX 10-22-2015  BILATERAL ICA 1-39%   Cataract    Coronary artery disease    CABG 2000 with LIMA> LAD, SVG > RCA and free radial > OM. 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   Gout    Gout 07/18/2010   Qualifier: Diagnosis of  By: Para March MD, Cheree Ditto     H/O eye injury    chronic changes to left eye after injury   Hand weakness 03/02/2018   Hearing loss 04/21/2013   History of TIA (transient ischemic attack)    03/ 2017  no residual after brief episode loss peripheral vision   HOH (hard of hearing)    Hyperglycemia 05/17/2016   Hyperlipemia    Hyperlipidemia 07/18/2010   Qualifier: Diagnosis of  By: Para March MD, Graham      Hypertension    HYPERTENSION, BENIGN ESSENTIAL 07/18/2010   Qualifier: Diagnosis of  By: Para March MD, Graham     Hypokalemia 01/26/2017   Knee pain 03/13/2019   Left shoulder pain 09/04/2018   Pneumothorax 10/11/2019   RIGHT    Prostate cancer (HCC) UROLOGIST-  DR GRAPEY/  ONCOLOGIST-  DR MANNING   dx 2015--- Stage T1c, Gleason 3+4, PSA 4.03, vol 44cc   PVC's (premature ventricular contractions)    Rash and nonspecific skin eruption 10/14/2015   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   S/P CABG x 05 Dec 1998   Skin lesion 10/14/2015   Stroke [facility])    TIA (transient ischemic attack) 10/14/2015   Ventral hernia 10/26/2011   Wears glasses    Past Surgical History:  Procedure Laterality Date   APPENDECTOMY  1975   CARDIAC CATHETERIZATION  12-22-2001   dr Verdis Prime   severe native vessel disease mLAD 60-70%,  total occulsion pCFX  and pRCA/  widely patent saphenous vein , free radial , and LIMA grafts/  minminal lv dysfunction, ef 60%   CHEST TUBE INSERTION Right 10/11/2019   CORONARY ARTERY BYPASS GRAFT  12/1998   LIMA to  LAD,  SVG to PDA and Diagonal, Free radial graft to OM   CORONARY STENT INTERVENTION N/A 05/26/2021   Procedure: CORONARY STENT INTERVENTION;  Surgeon: Orbie Pyo, MD;  Location: [facility];  Service: Cardiovascular;  Laterality: N/A;   Exericse treadmill test  last one 01-03-2014  dr Verdis Prime   normal exercise tolerance w/ hypertensive repsonse,  no ischemic EKG changes, appropriate HR response & recovery ([facility] TM score 9;  Low Risk , PVC's w/ exertion)   EYE SURGERY     FRACTURE SURGERY     LEFT HEART CATH AND CORS/GRAFTS ANGIOGRAPHY N/A 05/26/2021   Procedure: LEFT HEART CATH AND CORS/GRAFTS ANGIOGRAPHY;  Surgeon: Orbie Pyo, MD;  Location: [facility];  Service: Cardiovascular;  Laterality: N/A;   LOOP RECORDER INSERTION N/A 12/22/2022   Procedure: LOOP RECORDER INSERTION;  Surgeon: Marinus Maw, MD;  Location: [facility];  Service: Cardiovascular;  Laterality: N/A;   PROSTATE BIOPSY     RADIOACTIVE SEED IMPLANT N/A 01/13/2016   Procedure: RADIOACTIVE SEED IMPLANT/BRACHYTHERAPY IMPLANT;  Surgeon: Barron Alvine, MD;  Location: [facility];  Service: Urology;  Laterality: N/A;   Patient Active Problem List   Diagnosis Date Noted   Acute CVA (cerebrovascular accident) (HCC) 12/22/2022   CVA (cerebral vascular accident) (HCC) 12/22/2022   Constipation 03/04/2022   CAD (coronary artery disease) 05/24/2021   NSTEMI (non-ST elevated myocardial infarction) (HCC) 05/24/2021   Pneumothorax 10/11/2019   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   Knee joint symptom 03/13/2019   Left shoulder pain 09/04/2018   Diabetes mellitus without complication (HCC) 03/02/2018   Anxiety state 03/16/2016   Pseudophakia of both eyes 02/24/2016   TIA (transient ischemic attack) 10/14/2015   BPV (benign positional vertigo) 11/02/2014   Advance care planning 05/13/2014   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   Hearing loss 04/21/2013   Ventral hernia 10/26/2011   Medicare annual wellness visit, subsequent 10/26/2011   Prostate cancer (HCC) 10/26/2011   Coronary atherosclerosis 07/19/2010   GERD 07/19/2010   Hyperlipidemia 07/18/2010   Gout 07/18/2010   Hypertension 07/18/2010    ONSET DATE: 12/22/2022  REFERRING DIAG: I63.9 (ICD-10-CM) - Acute CVA (cerebrovascular accident) (HCC)  THERAPY DIAG:  Unsteadiness on feet  Other abnormalities of gait and mobility  Muscle weakness (generalized)  History of falling  Rationale for Evaluation and Treatment: Rehabilitation  SUBJECTIVE:  SUBJECTIVE STATEMENT:  Pt again enters clinic without RW, has not used it in 3 weeks. He reports no falls and no pain. Pt  reports that his balance is still a little "wishy-washy". Pt reports his HEP is going well, helps to target his balance impairments.  Pt and his wife have been recording BP and plan to send log to his doctor. Pt took his BP medication just before leaving for therapy today.  Pt accompanied by:  [name]   PERTINENT HISTORY: Pt is 72 yo male who presented on 12/21/22 with intermittent L leg weakness past 3 weeks. 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 Pt discharged on 12/22/22. Pt with hx including but not limited to CAD, HTN, TIA, anxiety, BPPV, HLD, gout, prostate CA, CABG and stents.  Pt also hard of hearing.   Vitals:   04/09/23 1329  BP: (!) 179/95  Pulse: (!) 56     PAIN:  Are you having pain? No  PRECAUTIONS: Fall  WEIGHT BEARING RESTRICTIONS: No  FALLS: Has patient fallen in last 6 months? Yes. Number of falls 5, denies injury, just some bruises   LIVING ENVIRONMENT: Lives with: lives with their spouse and has family closeby and grand son is always home  Lives in: House/apartment Stairs: Yes: Internal: 12 steps; on right going up, level entry into house  Has following equipment at home: Dan Humphreys - 2 wheeled, Environmental consultant - 4 wheeled, shower chair, and Uses standard 2 wheeled RW, uses rollator out in the community as he can sit in it.   PLOF: Independent and Leisure: enjoys biking and his riding his UTV  PATIENT GOALS: Wants to build more strength in L leg  OBJECTIVE:   DIAGNOSTIC FINDINGS: MRI brain wo contrast 12/21/2022: Intermediate sized area of acute ischemia within the right anterior cerebral artery territory. No hemorrhage or mass effect.   COGNITION: Overall cognitive status: Within functional limits for tasks assessed   SENSATION: Light touch: WFL  Reports some tingling in feet at night.   COORDINATION: Heel to shin: slightly slower with LLE   RAM with BUE: impaired coordination with LUE   POSTURE: rounded shoulders,  forward head, and weight shift right  LOWER EXTREMITY ROM:     Decr LLE knee extension AROM due to weakness  LOWER EXTREMITY MMT:    MMT Right Eval Left Eval  Hip flexion 5 4+  Hip extension    Hip abduction 5 5  Hip adduction 5 5  Hip internal rotation    Hip external rotation    Knee flexion 5 5  Knee extension 5 3  Ankle dorsiflexion 5 4  Ankle plantarflexion    Ankle inversion    Ankle eversion    (Blank rows = not tested)  All tested in sitting.    TODAY'S TREATMENT:  Therapeutic Activity:   Seated BP assessed at beginning of session. Pt noted to have hypertension this date due to just taking his medication prior to leaving for his appointment.  Vitals:   04/09/23 1329  BP: (!) 179/95  Pulse: (!) 56     Static standing balance with one LE on green dynadisc performing ball toss against rebounder 2 x 15 reps each with min A for balance  Resisted gait with blue theraband and no UE support: Forwards gait 4 x 50 ft Lateral sidestepping 2 x 50 ft L/R   6 Blaze pods on random setting for improved SLS stability and dynamic standing balance navigating over obstacles and on compliant surfaces.  Performed on 1 minute intervals with 30 rest periods.  Pt requires min A guarding. Round 1:  obstacle course setup.  12 hits. Round 2:  obstacle course setup.  10 hits. Round 3:  obstacle course setup.  10 hits. Notable errors/deficits:  cross over stepping to regain balance vs use of stepping out strategy     PATIENT EDUCATION: Education details: importance of continuing to monitor BP and communicate BP readings to physician as well as taking medicine at schedule times; continue HEP Person educated: Patient and Spouse Education method: Explanation Education comprehension: verbalized understanding  HOME EXERCISE PROGRAM: Access Code:  [email or web address] URL: [email or web address] Date: 04/07/2023 Prepared by: Sherlie Ban  Exercises - Sit to Stand  - 1 x daily - 7 x weekly - 3 sets - 10 reps - Mini Squat with Counter Support  - 1 x daily - 7 x weekly - 3 sets - 10 reps - Standing Single Leg Stance with Counter Support  - 1 x daily - 5 x weekly - 3 sets - 10-15 hold - Standing Marching  - 1 x daily - 5 x weekly - 3 sets - Tandem Walking with Counter Support  - 1-2 x daily - 5 x weekly - 3 sets - focus on slowed pace, going forwards and backwards  - Side Stepping with Resistance at Thighs and Counter Support  - 1-2 x daily - 5 x weekly - 3 sets - with blue theraband around thighs, cues for mini squat position   GOALS: Goals reviewed with patient? Yes   LONG TERM GOALS: Target date: 03/23/2023  Pt will be independent with final HEP with family supervision in order to build upon functional gains made in therapy. Baseline:  Goal status: IN PROGRESS  2.  Pt will improve Berg score to 10/56 for decreased fall risk Baseline: 2/56 (7/2); 50/56 on 8/20 Goal status: MET  3.  Pt will improve gait speed with RW vs. LRAD to at least 1.8 ft/sec in order to demo improved household mobility/decr fall risk  Baseline: 44.22 seconds with RW = .74 ft/sec, pt needing one brief standing rest break due to LLE weakness and felt like LLE could buckle, 2.44 ft/sec no AD (8/23) Goal status: MET  4.  Pt will ambulate at least 400' with LRAD and supervision (over indoor/outdoor paved surfaces) in order to demo improved household/community mobility.  Baseline:  24.5 seconds with no UE support; 1000 ft outdoors with CGA (8/23) Goal status: IN PROGRESS  5.  Pt will improve 5x sit<>stand to less than or equal to 16 sec with no UE support to demonstrate improved functional strength and transfer efficiency.  Baseline: 14.8 seconds (8/20) Goal status: MET  NEW SHORT TERM GOALS=LONG TERM GOALS due to length of POC   NEW LONG TERM GOALS:  Target date: 04/30/2023  Pt will be independent with final HEP for improved strength, balance, transfers and gait. Baseline:  Goal status: IN PROGRESS  2.  Pt will improve FGA to at least a 28/30 in order to demo decr fall risk.  Baseline: 25/30  Goal status: INITIAL  3.  Pt will improve gait velocity to at least 3.0 ft/sec for improved gait efficiency and performance at Supervision level  Baseline: 2.44 ft/sec no AD with CGA (8/23) Goal status: INITIAL    ASSESSMENT:  CLINICAL IMPRESSION: Emphasis of skilled PT session on continuing to monitor vitals, discussing importance of BP monitoring and taking meds at scheduled times, as well as continuing to work on dynamic standing balance and LE strengthening. Pt continues to progress with his standing balance and is bale to take on higher level challenges in therapy sessions. Pt continues to benefit from skilled therapy services to work towards LTGs and return to PLOF. Continue POC.    OBJECTIVE IMPAIRMENTS: Abnormal gait, decreased activity tolerance, decreased balance, decreased coordination, decreased endurance, decreased knowledge of use of DME, decreased mobility, difficulty walking, decreased ROM, decreased strength, and postural dysfunction.   ACTIVITY LIMITATIONS: carrying, lifting, bending, standing, stairs, transfers, and locomotion level  PARTICIPATION LIMITATIONS: cleaning, driving, shopping, community activity, and yard work  PERSONAL FACTORS: Age, Behavior pattern, Past/current experiences, Time since onset of injury/illness/exacerbation, and 3+ comorbidities: CAD, HTN, TIA, anxiety, BPPV, HLD, gout, prostate CA, CABG and stents  are also affecting patient's functional outcome.   REHAB POTENTIAL: Good  CLINICAL DECISION MAKING: Evolving/moderate complexity  EVALUATION COMPLEXITY: Moderate  PLAN:  PT FREQUENCY: 2x/week  PT DURATION: 8 weeks + 4 weeks (recert)  PLANNED INTERVENTIONS: Therapeutic exercises,  Therapeutic activity, Neuromuscular re-education, Balance training, Gait training, Patient/Family education, Self Care, Joint mobilization, Stair training, Orthotic/Fit training, DME instructions, Aquatic Therapy, Manual therapy, and Re-evaluation  PLAN FOR NEXT SESSION:  CHECK BP!!! Work on gait across [employer], Work on [employer], standing balance, functional strength (focus on hips), SLS tasks. Blaze pod obstacle courses.   Peter Congo, PT, DPT, CSRS  04/09/23 2:01 PM

## 2023-04-12 DIAGNOSIS — Z23 Encounter for immunization: Secondary | ICD-10-CM | POA: Diagnosis not present

## 2023-04-13 ENCOUNTER — Ambulatory Visit: Payer: Medicare Other | Admitting: Physical Therapy

## 2023-04-13 VITALS — BP 165/91 | HR 69

## 2023-04-13 DIAGNOSIS — R2681 Unsteadiness on feet: Secondary | ICD-10-CM

## 2023-04-13 DIAGNOSIS — M6281 Muscle weakness (generalized): Secondary | ICD-10-CM

## 2023-04-13 DIAGNOSIS — R2689 Other abnormalities of gait and mobility: Secondary | ICD-10-CM

## 2023-04-13 DIAGNOSIS — Z9181 History of falling: Secondary | ICD-10-CM | POA: Diagnosis not present

## 2023-04-13 NOTE — Therapy (Signed)
OUTPATIENT PHYSICAL THERAPY NEURO TREATMENT   Patient Name: Tyler Guerra. MRN: [id number] DOB:10/04/1950, 72 y.o., male Today's Date: 04/13/2023   PCP: Tyler Nam, MD  REFERRING PROVIDER: Merlene Laughter, DO     END OF SESSION:  PT End of Session - 04/13/23 1320     Visit Number 19    Number of Visits 24   recert   Date for PT Re-Evaluation 05/07/23    Authorization Type Medicare -Part A & B    PT Start Time 1320    PT Stop Time 1404    PT Time Calculation (min) 44 min    Equipment Utilized During Treatment Gait belt    Activity Tolerance Patient tolerated treatment well    Behavior During Therapy WFL for tasks assessed/performed                    Past Medical History:  Diagnosis Date   Anxiety state 03/16/2016   Aphasia 01/26/2017   Benign localized prostatic hyperplasia with lower urinary tract symptoms (LUTS)    BPV (benign positional vertigo) 11/02/2014   Carotid artery stenosis    PER DUPLEX 10-22-2015  BILATERAL ICA 1-39%   Cataract    Coronary artery disease    CABG 2000 with LIMA> LAD, SVG > RCA and free radial > OM. 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   Gout    Gout 07/18/2010   Qualifier: Diagnosis of  By: Para March MD, Cheree Ditto     H/O eye injury    chronic changes to left eye after injury   Hand weakness 03/02/2018   Hearing loss 04/21/2013   History of TIA (transient ischemic attack)    03/ 2017  no residual after brief episode loss peripheral vision   HOH (hard of hearing)    Hyperglycemia 05/17/2016   Hyperlipemia    Hyperlipidemia 07/18/2010   Qualifier: Diagnosis of  By: Para March MD, Graham     Hypertension     HYPERTENSION, BENIGN ESSENTIAL 07/18/2010   Qualifier: Diagnosis of  By: Para March MD, Graham     Hypokalemia 01/26/2017   Knee pain 03/13/2019   Left shoulder pain 09/04/2018   Pneumothorax 10/11/2019   RIGHT    Prostate cancer (HCC) UROLOGIST-  DR GRAPEY/  ONCOLOGIST-  DR MANNING   dx 2015--- Stage T1c, Gleason 3+4, PSA 4.03, vol 44cc   PVC's (premature ventricular contractions)    Rash and nonspecific skin eruption 10/14/2015   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   S/P CABG x 05 Dec 1998   Skin lesion 10/14/2015   Stroke [facility])    TIA (transient ischemic attack) 10/14/2015   Ventral hernia 10/26/2011   Wears glasses    Past Surgical History:  Procedure Laterality Date   APPENDECTOMY  1975   CARDIAC CATHETERIZATION  12-22-2001   dr Verdis Prime   severe native vessel disease mLAD 60-70%,  total occulsion pCFX  and pRCA/  widely patent saphenous vein , free radial , and LIMA grafts/  minminal lv dysfunction, ef 60%   CHEST TUBE INSERTION Right 10/11/2019   CORONARY ARTERY BYPASS GRAFT  12/1998   LIMA to LAD,  SVG  to PDA and Diagonal, Free radial graft to OM   CORONARY STENT INTERVENTION N/A 05/26/2021   Procedure: CORONARY STENT INTERVENTION;  Surgeon: Orbie Pyo, MD;  Location: [facility];  Service: Cardiovascular;  Laterality: N/A;   Exericse treadmill test  last one 01-03-2014  dr Verdis Prime   normal exercise tolerance w/ hypertensive repsonse,  no ischemic EKG changes, appropriate HR response & recovery ([facility] TM score 9;  Low Risk , PVC's w/ exertion)   EYE SURGERY     FRACTURE SURGERY     LEFT HEART CATH AND CORS/GRAFTS ANGIOGRAPHY N/A 05/26/2021   Procedure: LEFT HEART CATH AND CORS/GRAFTS ANGIOGRAPHY;  Surgeon: Orbie Pyo, MD;  Location: [facility];  Service: Cardiovascular;  Laterality: N/A;   LOOP RECORDER INSERTION N/A 12/22/2022   Procedure: LOOP RECORDER INSERTION;  Surgeon: Marinus Maw, MD;  Location: [facility];   Service: Cardiovascular;  Laterality: N/A;   PROSTATE BIOPSY     RADIOACTIVE SEED IMPLANT N/A 01/13/2016   Procedure: RADIOACTIVE SEED IMPLANT/BRACHYTHERAPY IMPLANT;  Surgeon: Barron Alvine, MD;  Location: [facility];  Service: Urology;  Laterality: N/A;   Patient Active Problem List   Diagnosis Date Noted   Acute CVA (cerebrovascular accident) (HCC) 12/22/2022   CVA (cerebral vascular accident) (HCC) 12/22/2022   Constipation 03/04/2022   CAD (coronary artery disease) 05/24/2021   NSTEMI (non-ST elevated myocardial infarction) (HCC) 05/24/2021   Pneumothorax 10/11/2019   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   Knee joint symptom 03/13/2019   Left shoulder pain 09/04/2018   Diabetes mellitus without complication (HCC) 03/02/2018   Anxiety state 03/16/2016   Pseudophakia of both eyes 02/24/2016   TIA (transient ischemic attack) 10/14/2015   BPV (benign positional vertigo) 11/02/2014   Advance care planning 05/13/2014   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   Hearing loss 04/21/2013   Ventral hernia 10/26/2011   Medicare annual wellness visit, subsequent 10/26/2011   Prostate cancer (HCC) 10/26/2011   Coronary atherosclerosis 07/19/2010   GERD 07/19/2010   Hyperlipidemia 07/18/2010   Gout 07/18/2010   Hypertension 07/18/2010    ONSET DATE: 12/22/2022  REFERRING DIAG: I63.9 (ICD-10-CM) - Acute CVA (cerebrovascular accident) (HCC)  THERAPY DIAG:  Unsteadiness on feet  Other abnormalities of gait and mobility  Muscle weakness (generalized)  History of falling  Rationale for Evaluation and Treatment: Rehabilitation  SUBJECTIVE:                                                                                                                                                                                             SUBJECTIVE STATEMENT:  Pt just got his covid shot, flu shot, and pneumonia shot yesterday, woke up with sore back and shoulders.  Pt with some  anterior lean when entering his appointment this session but continues to not use an AD for community mobility.  Pt accompanied by:  wife Tyler Guerra   PERTINENT HISTORY: Pt is 72 yo male who presented on 12/21/22 with intermittent L leg weakness past 3 weeks. 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 Pt discharged on 12/22/22. Pt with hx including but not limited to CAD, HTN, TIA, anxiety, BPPV, HLD, gout, prostate CA, CABG and stents.  Pt also hard of hearing.   Vitals:   04/13/23 1324  BP: (!) 165/91  Pulse: 69      PAIN:  Are you having pain? No  PRECAUTIONS: Fall  WEIGHT BEARING RESTRICTIONS: No  FALLS: Has patient fallen in last 6 months? Yes. Number of falls 5, denies injury, just some bruises   LIVING ENVIRONMENT: Lives with: lives with their spouse and has family closeby and grand son is always home  Lives in: House/apartment Stairs: Yes: Internal: 12 steps; on right going up, level entry into house  Has following equipment at home: Dan Humphreys - 2 wheeled, Environmental consultant - 4 wheeled, shower chair, and Uses standard 2 wheeled RW, uses rollator out in the community as he can sit in it.   PLOF: Independent and Leisure: enjoys biking and his riding his UTV  PATIENT GOALS: Wants to build more strength in L leg  OBJECTIVE:   DIAGNOSTIC FINDINGS: MRI brain wo contrast 12/21/2022: Intermediate sized area of acute ischemia within the right anterior cerebral artery territory. No hemorrhage or mass effect.   COGNITION: Overall cognitive status: Within functional limits for tasks assessed   SENSATION: Light touch: WFL  Reports some tingling in feet at night.   COORDINATION: Heel to shin: slightly slower with LLE   RAM with BUE: impaired coordination with LUE   POSTURE: rounded shoulders, forward head, and weight shift right  LOWER EXTREMITY ROM:     Decr LLE knee extension AROM due to weakness  LOWER EXTREMITY MMT:    MMT Right Eval Left Eval   Hip flexion 5 4+  Hip extension    Hip abduction 5 5  Hip adduction 5 5  Hip internal rotation    Hip external rotation    Knee flexion 5 5  Knee extension 5 3  Ankle dorsiflexion 5 4  Ankle plantarflexion    Ankle inversion    Ankle eversion    (Blank rows = not tested)  All tested in sitting.    TODAY'S TREATMENT:                                                                                                                               Therapeutic Activity:  Seated BP assessed at beginning of session. Pt noted to have hypertension this date though improved from previous  session. Vitals:   04/13/23 1324  BP: (!) 165/91  Pulse: 69     NMR To work on SL stability, static standing balance, and LE strengthening: Resisted dot taps with blue theraband around ankles performing alt L/R dot taps crossing midline and outside BOS Static stance on airex performing alt L/R dot taps with min A for balance with focus on SL stability on compliant surface  6" resisted step taps with blue theraband with BUE support at bottom of stairs 12" step taps with BUE support at bottom of stairs  To work on use of balance strategies to prevent a fall: Rockerboard A/P stance 3 x 30 sec each no UE support + 4# weighted ball punch outs 2 x 10 reps + 4# weighted ball OH lifts 2 x 10 reps Rockerboard L/R stance 3 x 30 sec each no UE support + 4# weighted ball punch outs 2 x 10 reps + 4# weighted ball OH lifts 2 x 10 reps    PATIENT EDUCATION: Education details: importance of continuing to monitor BP, continue HEP Person educated: Patient and Spouse Education method: Explanation Education comprehension: verbalized understanding  HOME EXERCISE PROGRAM: Access Code: [email or web address] URL: [email or web address] Date: 04/07/2023 Prepared by: Sherlie Ban  Exercises - Sit to Stand  - 1 x daily - 7 x weekly - 3 sets - 10 reps - Mini Squat with Counter Support  - 1 x daily - 7 x weekly  - 3 sets - 10 reps - Standing Single Leg Stance with Counter Support  - 1 x daily - 5 x weekly - 3 sets - 10-15 hold - Standing Marching  - 1 x daily - 5 x weekly - 3 sets - Tandem Walking with Counter Support  - 1-2 x daily - 5 x weekly - 3 sets - focus on slowed pace, going forwards and backwards  - Side Stepping with Resistance at Thighs and Counter Support  - 1-2 x daily - 5 x weekly - 3 sets - with blue theraband around thighs, cues for mini squat position   GOALS: Goals reviewed with patient? Yes   LONG TERM GOALS: Target date: 03/23/2023  Pt will be independent with final HEP with family supervision in order to build upon functional gains made in therapy. Baseline:  Goal status: IN PROGRESS  2.  Pt will improve Berg score to 10/56 for decreased fall risk Baseline: 2/56 (7/2); 50/56 on 8/20 Goal status: MET  3.  Pt will improve gait speed with RW vs. LRAD to at least 1.8 ft/sec in order to demo improved household mobility/decr fall risk  Baseline: 44.22 seconds with RW = .74 ft/sec, pt needing one brief standing rest break due to LLE weakness and felt like LLE could buckle, 2.44 ft/sec no AD (8/23) Goal status: MET  4.  Pt will ambulate at least 400' with LRAD and supervision (over indoor/outdoor paved surfaces) in order to demo improved household/community mobility.  Baseline:  24.5 seconds with no UE support; 1000 ft outdoors with CGA (8/23) Goal status: IN PROGRESS  5.  Pt will improve 5x sit<>stand to less than or equal to 16 sec with no UE support to demonstrate improved functional strength and transfer efficiency.  Baseline: 14.8 seconds (8/20) Goal status: MET  NEW SHORT TERM GOALS=LONG TERM GOALS due to length of POC   NEW LONG TERM GOALS:  Target date: 04/30/2023  Pt will be independent with final HEP for improved strength, balance, transfers and gait. Baseline:  Goal status: IN PROGRESS  2.  Pt will improve FGA to at least a 28/30 in order to demo decr fall  risk.  Baseline: 25/30  Goal status: INITIAL  3.  Pt will improve gait velocity to at least 3.0 ft/sec for improved gait efficiency and performance at Supervision level  Baseline: 2.44 ft/sec no AD with CGA (8/23) Goal status: INITIAL    ASSESSMENT:  CLINICAL IMPRESSION: Emphasis of skilled PT session on continuing to work on SL stability, LE strengthening, and balance strategies. Pt with minor LOB with balance challenges this session, requires up to min A to recover. Pt continues to benefit from skilled therapy services to work towards increasing his safety and independence with functional mobility. Continue POC.    OBJECTIVE IMPAIRMENTS: Abnormal gait, decreased activity tolerance, decreased balance, decreased coordination, decreased endurance, decreased knowledge of use of DME, decreased mobility, difficulty walking, decreased ROM, decreased strength, and postural dysfunction.   ACTIVITY LIMITATIONS: carrying, lifting, bending, standing, stairs, transfers, and locomotion level  PARTICIPATION LIMITATIONS: cleaning, driving, shopping, community activity, and yard work  PERSONAL FACTORS: Age, Behavior pattern, Past/current experiences, Time since onset of injury/illness/exacerbation, and 3+ comorbidities: CAD, HTN, TIA, anxiety, BPPV, HLD, gout, prostate CA, CABG and stents  are also affecting patient's functional outcome.   REHAB POTENTIAL: Good  CLINICAL DECISION MAKING: Evolving/moderate complexity  EVALUATION COMPLEXITY: Moderate  PLAN:  PT FREQUENCY: 2x/week  PT DURATION: 8 weeks + 4 weeks (recert)  PLANNED INTERVENTIONS: Therapeutic exercises, Therapeutic activity, Neuromuscular re-education, Balance training, Gait training, Patient/Family education, Self Care, Joint mobilization, Stair training, Orthotic/Fit training, DME instructions, Aquatic Therapy, Manual therapy, and Re-evaluation  PLAN FOR NEXT SESSION:  CHECK BP!!! Work on gait across [employer], Work on  [employer], standing balance, functional strength (focus on hips), SLS tasks. Blaze pod obstacle courses., pt goals?   Peter Congo, PT, DPT, CSRS  04/13/23 2:04 PM

## 2023-04-16 ENCOUNTER — Encounter: Payer: Self-pay | Admitting: Family Medicine

## 2023-04-16 ENCOUNTER — Ambulatory Visit: Payer: Medicare Other | Admitting: Physical Therapy

## 2023-04-16 VITALS — BP 165/95 | HR 58

## 2023-04-16 DIAGNOSIS — R2689 Other abnormalities of gait and mobility: Secondary | ICD-10-CM | POA: Diagnosis not present

## 2023-04-16 DIAGNOSIS — R2681 Unsteadiness on feet: Secondary | ICD-10-CM

## 2023-04-16 DIAGNOSIS — Z9181 History of falling: Secondary | ICD-10-CM | POA: Diagnosis not present

## 2023-04-16 DIAGNOSIS — M6281 Muscle weakness (generalized): Secondary | ICD-10-CM

## 2023-04-16 NOTE — Therapy (Signed)
OUTPATIENT PHYSICAL THERAPY NEURO TREATMENT - 20th VISIT PROGRESS NOTE  Patient Name: Tyler Guerra. MRN: [id number] DOB:14-Apr-1951, 72 y.o., male Today's Date: 04/16/2023   PCP: Joaquim Nam, MD  REFERRING PROVIDER: Merlene Laughter, DO  Physical Therapy Progress Note   Dates of Reporting Period: 03/05/2023 - 04/16/2023  See Note below for Objective Data and Assessment of Progress/Goals.  Thank you for the referral of this patient. Peter Congo, PT, DPT, CSRS    END OF SESSION:  PT End of Session - 04/16/23 1320     Visit Number 20    Number of Visits 24   recert   Date for PT Re-Evaluation 05/07/23    Authorization Type Medicare -Part A & B    PT Start Time 1320    PT Stop Time 1359    PT Time Calculation (min) 39 min    Equipment Utilized During Treatment Gait belt    Activity Tolerance Patient tolerated treatment well    Behavior During Therapy WFL for tasks assessed/performed                     Past Medical History:  Diagnosis Date   Anxiety state 03/16/2016   Aphasia 01/26/2017   Benign localized prostatic hyperplasia with lower urinary tract symptoms (LUTS)    BPV (benign positional vertigo) 11/02/2014   Carotid artery stenosis    PER DUPLEX 10-22-2015  BILATERAL ICA 1-39%   Cataract    Coronary artery disease    CABG 2000 with LIMA> LAD, SVG > RCA and free radial > OM. 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   Gout    Gout 07/18/2010   Qualifier: Diagnosis of  By: Para March MD, Cheree Ditto     H/O eye injury    chronic changes to left eye after injury   Hand weakness 03/02/2018   Hearing loss 04/21/2013   History of TIA (transient  ischemic attack)    03/ 2017  no residual after brief episode loss peripheral vision   HOH (hard of hearing)    Hyperglycemia 05/17/2016   Hyperlipemia    Hyperlipidemia 07/18/2010   Qualifier: Diagnosis of  By: Para March MD, Graham     Hypertension    HYPERTENSION, BENIGN ESSENTIAL 07/18/2010   Qualifier: Diagnosis of  By: Para March MD, Graham     Hypokalemia 01/26/2017   Knee pain 03/13/2019   Left shoulder pain 09/04/2018   Pneumothorax 10/11/2019   RIGHT    Prostate cancer (HCC) UROLOGIST-  DR GRAPEY/  ONCOLOGIST-  DR MANNING   dx 2015--- Stage T1c, Gleason 3+4, PSA 4.03, vol 44cc   PVC's (premature ventricular contractions)    Rash and nonspecific skin eruption 10/14/2015   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   S/P CABG x 05 Dec 1998   Skin lesion 10/14/2015   Stroke [facility])    TIA (transient ischemic attack) 10/14/2015   Ventral hernia 10/26/2011   Wears glasses    Past Surgical History:  Procedure Laterality Date   APPENDECTOMY  1975   CARDIAC CATHETERIZATION  12-22-2001   dr Verdis Prime   severe native vessel disease mLAD 60-70%,  total occulsion pCFX  and pRCA/  widely patent saphenous vein , free radial , and LIMA grafts/  minminal lv dysfunction, ef 60%   CHEST TUBE INSERTION Right 10/11/2019   CORONARY ARTERY BYPASS GRAFT  12/1998   LIMA to LAD,  SVG to PDA and Diagonal, Free radial graft to OM   CORONARY STENT INTERVENTION N/A 05/26/2021   Procedure: CORONARY STENT INTERVENTION;  Surgeon: Orbie Pyo, MD;  Location: [facility];  Service: Cardiovascular;  Laterality: N/A;   Exericse treadmill test  last one 01-03-2014  dr Verdis Prime   normal exercise tolerance w/ hypertensive repsonse,  no ischemic EKG changes, appropriate HR response & recovery ([facility] TM score 9;  Low Risk , PVC's w/ exertion)   EYE SURGERY     FRACTURE SURGERY     LEFT HEART CATH AND CORS/GRAFTS ANGIOGRAPHY N/A 05/26/2021   Procedure: LEFT HEART CATH AND CORS/GRAFTS  ANGIOGRAPHY;  Surgeon: Orbie Pyo, MD;  Location: [facility];  Service: Cardiovascular;  Laterality: N/A;   LOOP RECORDER INSERTION N/A 12/22/2022   Procedure: LOOP RECORDER INSERTION;  Surgeon: Marinus Maw, MD;  Location: [facility];  Service: Cardiovascular;  Laterality: N/A;   PROSTATE BIOPSY     RADIOACTIVE SEED IMPLANT N/A 01/13/2016   Procedure: RADIOACTIVE SEED IMPLANT/BRACHYTHERAPY IMPLANT;  Surgeon: Barron Alvine, MD;  Location: [facility];  Service: Urology;  Laterality: N/A;   Patient Active Problem List   Diagnosis Date Noted   Acute CVA (cerebrovascular accident) (HCC) 12/22/2022   CVA (cerebral vascular accident) (HCC) 12/22/2022   Constipation 03/04/2022   CAD (coronary artery disease) 05/24/2021   NSTEMI (non-ST elevated myocardial infarction) (HCC) 05/24/2021   Pneumothorax 10/11/2019   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   Knee joint symptom 03/13/2019   Left shoulder pain 09/04/2018   Diabetes mellitus without complication (HCC) 03/02/2018   Anxiety state 03/16/2016   Pseudophakia of both eyes 02/24/2016   TIA (transient ischemic attack) 10/14/2015   BPV (benign positional vertigo) 11/02/2014   Advance care planning 05/13/2014   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   Hearing loss 04/21/2013   Ventral hernia 10/26/2011   Medicare annual wellness visit, subsequent 10/26/2011   Prostate cancer (HCC) 10/26/2011   Coronary atherosclerosis 07/19/2010   GERD 07/19/2010   Hyperlipidemia 07/18/2010   Gout 07/18/2010   Hypertension 07/18/2010    ONSET DATE: 12/22/2022  REFERRING DIAG: I63.9 (ICD-10-CM) - Acute CVA (cerebrovascular accident) (HCC)  THERAPY DIAG:  Unsteadiness on feet  Other abnormalities of gait and mobility  Muscle weakness (generalized)  History of falling  Rationale for Evaluation and Treatment: Rehabilitation  SUBJECTIVE:  SUBJECTIVE STATEMENT:  Pt reports he wore himself out the past few days working outside and going up/down slight grade/hill in his backyard, gets tired quickly. Pt reports that he felt good after PT session the other day. Pt's wife did send BP readings to PCP and he meets with him next Tuesday. No falls. No pain.  Regarding ongoing deficits pt reports that he feels like his legs are not at the same strength from before stroke and he has noticed decreased stamina/endurance.   Pt accompanied by:  wife Tyler Guerra   PERTINENT HISTORY: Pt is 72 yo male who presented on 12/21/22 with intermittent L leg weakness past 3 weeks. 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 Pt discharged on 12/22/22. Pt with hx including but not limited to CAD, HTN, TIA, anxiety, BPPV, HLD, gout, prostate CA, CABG and stents.  Pt also hard of hearing.   Vitals:   04/16/23 1339  BP: (!) 165/95  Pulse: (!) 58       PAIN:  Are you having pain? No  PRECAUTIONS: Fall  WEIGHT BEARING RESTRICTIONS: No  FALLS: Has patient fallen in last 6 months? Yes. Number of falls 5, denies injury, just some bruises   LIVING ENVIRONMENT: Lives with: lives with their spouse and has family closeby and grand son is always home  Lives in: House/apartment Stairs: Yes: Internal: 12 steps; on right going up, level entry into house  Has following equipment at home: Dan Humphreys - 2 wheeled, Environmental consultant - 4 wheeled, shower chair, and Uses standard 2 wheeled RW, uses rollator out in the community as he can sit in it.   PLOF: Independent and Leisure: enjoys biking and his riding his UTV  PATIENT GOALS: Wants to build more strength in L leg  OBJECTIVE:   DIAGNOSTIC FINDINGS: MRI brain wo contrast 12/21/2022: Intermediate sized area of acute ischemia within the right anterior cerebral artery territory. No  hemorrhage or mass effect.   COGNITION: Overall cognitive status: Within functional limits for tasks assessed   SENSATION: Light touch: WFL  Reports some tingling in feet at night.   COORDINATION: Heel to shin: slightly slower with LLE   RAM with BUE: impaired coordination with LUE   POSTURE: rounded shoulders, forward head, and weight shift right  LOWER EXTREMITY ROM:     Decr LLE knee extension AROM due to weakness  LOWER EXTREMITY MMT:    MMT Right Eval Left Eval  Hip flexion 5 4+  Hip extension    Hip abduction 5 5  Hip adduction 5 5  Hip internal rotation    Hip external rotation    Knee flexion 5 5  Knee extension 5 3  Ankle dorsiflexion 5 4  Ankle plantarflexion    Ankle inversion    Ankle eversion    (Blank rows = not tested)  All tested in sitting.    TODAY'S TREATMENT:  Therapeutic Activity:  Seated BP assessed at beginning of session. Pt noted to have hypertension though narrowly within safe limits for participation in therapy session. Vitals:   04/16/23 1339  BP: (!) 165/95  Pulse: (!) 58    Discussed PT POC with plan to d/c at end of this POC.  NMR Half-kneel position on red mat on floor to work on hip strengthening: R/L half kneel static balance no UE support and CGA 2 x 30 sec each Added in unweighted ball punch-outs 2 x 10 reps each  To work on functional hip strengthening: Resisted sit to stands onto red therapy ball with blue band around waist 2 x 10 reps no UE support    PATIENT EDUCATION: Education details: importance of continuing to monitor BP, continue HEP, PT POC Person educated: Patient and Spouse Education method: Explanation Education comprehension: verbalized understanding  HOME EXERCISE PROGRAM: Access Code: [email or web address] URL: [email or web address] Date: 04/07/2023 Prepared by:  Sherlie Ban  Exercises - Sit to Stand  - 1 x daily - 7 x weekly - 3 sets - 10 reps - Mini Squat with Counter Support  - 1 x daily - 7 x weekly - 3 sets - 10 reps - Standing Single Leg Stance with Counter Support  - 1 x daily - 5 x weekly - 3 sets - 10-15 hold - Standing Marching  - 1 x daily - 5 x weekly - 3 sets - Tandem Walking with Counter Support  - 1-2 x daily - 5 x weekly - 3 sets - focus on slowed pace, going forwards and backwards  - Side Stepping with Resistance at Thighs and Counter Support  - 1-2 x daily - 5 x weekly - 3 sets - with blue theraband around thighs, cues for mini squat position   GOALS: Goals reviewed with patient? Yes   LONG TERM GOALS: Target date: 03/23/2023  Pt will be independent with final HEP with family supervision in order to build upon functional gains made in therapy. Baseline:  Goal status: IN PROGRESS  2.  Pt will improve Berg score to 10/56 for decreased fall risk Baseline: 2/56 (7/2); 50/56 on 8/20 Goal status: MET  3.  Pt will improve gait speed with RW vs. LRAD to at least 1.8 ft/sec in order to demo improved household mobility/decr fall risk  Baseline: 44.22 seconds with RW = .74 ft/sec, pt needing one brief standing rest break due to LLE weakness and felt like LLE could buckle, 2.44 ft/sec no AD (8/23) Goal status: MET  4.  Pt will ambulate at least 400' with LRAD and supervision (over indoor/outdoor paved surfaces) in order to demo improved household/community mobility.  Baseline:  24.5 seconds with no UE support; 1000 ft outdoors with CGA (8/23) Goal status: IN PROGRESS  5.  Pt will improve 5x sit<>stand to less than or equal to 16 sec with no UE support to demonstrate improved functional strength and transfer efficiency.  Baseline: 14.8 seconds (8/20) Goal status: MET  NEW SHORT TERM GOALS=LONG TERM GOALS due to length of POC   NEW LONG TERM GOALS:  Target date: 04/30/2023  Pt will be independent with final HEP for improved  strength, balance, transfers and gait. Baseline:  Goal status: IN PROGRESS  2.  Pt will improve FGA to at least a 28/30 in order to demo decr fall risk.  Baseline: 25/30  Goal status: INITIAL  3.  Pt will improve gait velocity to at least 3.0 ft/sec for improved gait efficiency and performance at  Supervision level  Baseline: 2.44 ft/sec no AD with CGA (8/23) Goal status: INITIAL    ASSESSMENT:  CLINICAL IMPRESSION: Emphasis of skilled PT session on continuing to monitor vitals, discussing PT POC, and continuing to work on functional LE strengthening. Pt does continue to exhibit decreased endurance as evidenced by onset of fatigue with functional activity over the past few days. Pt continues to benefit from skilled therapy services to work towards LTGs. Continue POC.    OBJECTIVE IMPAIRMENTS: Abnormal gait, decreased activity tolerance, decreased balance, decreased coordination, decreased endurance, decreased knowledge of use of DME, decreased mobility, difficulty walking, decreased ROM, decreased strength, and postural dysfunction.   ACTIVITY LIMITATIONS: carrying, lifting, bending, standing, stairs, transfers, and locomotion level  PARTICIPATION LIMITATIONS: cleaning, driving, shopping, community activity, and yard work  PERSONAL FACTORS: Age, Behavior pattern, Past/current experiences, Time since onset of injury/illness/exacerbation, and 3+ comorbidities: CAD, HTN, TIA, anxiety, BPPV, HLD, gout, prostate CA, CABG and stents  are also affecting patient's functional outcome.   REHAB POTENTIAL: Good  CLINICAL DECISION MAKING: Evolving/moderate complexity  EVALUATION COMPLEXITY: Moderate  PLAN:  PT FREQUENCY: 2x/week  PT DURATION: 8 weeks + 4 weeks (recert)  PLANNED INTERVENTIONS: Therapeutic exercises, Therapeutic activity, Neuromuscular re-education, Balance training, Gait training, Patient/Family education, Self Care, Joint mobilization, Stair training, Orthotic/Fit  training, DME instructions, Aquatic Therapy, Manual therapy, and Re-evaluation  PLAN FOR NEXT SESSION:  CHECK BP!!! Work on gait across [employer], Work on [employer], standing balance, functional strength (focus on hips), SLS tasks. Blaze pod obstacle courses., endurance and functional LE strengthening  Interested in chair yoga   Peter Congo, PT, DPT, CSRS  04/16/23 2:00 PM

## 2023-04-18 ENCOUNTER — Other Ambulatory Visit: Payer: Self-pay | Admitting: Family Medicine

## 2023-04-18 MED ORDER — TAMSULOSIN HCL 0.4 MG PO CAPS: 0.4000 mg | ORAL_CAPSULE | Freq: Every day | ORAL | Status: DC

## 2023-04-18 MED ORDER — LOSARTAN POTASSIUM 25 MG PO TABS: 12.5000 mg | ORAL_TABLET | Freq: Every day | ORAL | Status: DC

## 2023-04-20 ENCOUNTER — Encounter: Payer: Self-pay | Admitting: Family Medicine

## 2023-04-20 ENCOUNTER — Ambulatory Visit (INDEPENDENT_AMBULATORY_CARE_PROVIDER_SITE_OTHER): Payer: Medicare Other | Admitting: Family Medicine

## 2023-04-20 ENCOUNTER — Ambulatory Visit: Payer: Medicare Other | Admitting: Physical Therapy

## 2023-04-20 VITALS — BP 122/78 | HR 72 | Temp 98.1°F | Ht 69.0 in | Wt 211.0 lb

## 2023-04-20 VITALS — BP 170/93 | HR 66

## 2023-04-20 DIAGNOSIS — R2681 Unsteadiness on feet: Secondary | ICD-10-CM | POA: Diagnosis not present

## 2023-04-20 DIAGNOSIS — M6281 Muscle weakness (generalized): Secondary | ICD-10-CM | POA: Diagnosis not present

## 2023-04-20 DIAGNOSIS — I10 Essential (primary) hypertension: Secondary | ICD-10-CM

## 2023-04-20 DIAGNOSIS — Z9181 History of falling: Secondary | ICD-10-CM | POA: Diagnosis not present

## 2023-04-20 DIAGNOSIS — R2689 Other abnormalities of gait and mobility: Secondary | ICD-10-CM | POA: Diagnosis not present

## 2023-04-20 NOTE — Patient Instructions (Addendum)
Recheck in mid December at a visit here at clinic.   We can do A1c at the visit.  You don't need to fast.   Take care.  Glad to see you.  If your BP is consistently above 140/90 and you don't have lower BP readings, then try taking a full tab of losartan for 1 day.  You may be able to continue with the full tab dose but if you are lightheaded at all then cut back to 1/2 tab a day.   Update me as needed.   You don't need to check your BP everyday, but when you do check it I would do so in the middle of the day.

## 2023-04-20 NOTE — Therapy (Signed)
OUTPATIENT PHYSICAL THERAPY NEURO TREATMENT  Patient Name: Tyler Guerra. MRN: [id number] DOB:September 01, 1950, 73 y.o., male Today's Date: 04/20/2023   PCP: Joaquim Nam, MD  REFERRING PROVIDER: Merlene Laughter, DO     END OF SESSION:  PT End of Session - 04/20/23 1152     Visit Number 21    Number of Visits 24   recert   Date for PT Re-Evaluation 05/07/23    Authorization Type Medicare -Part A & B    PT Start Time 1150    PT Stop Time 1230    PT Time Calculation (min) 40 min    Equipment Utilized During Treatment Gait belt    Activity Tolerance Patient tolerated treatment well    Behavior During Therapy [facility] for tasks assessed/performed                      Past Medical History:  Diagnosis Date   Anxiety state 03/16/2016   Aphasia 01/26/2017   Benign localized prostatic hyperplasia with lower urinary tract symptoms (LUTS)    BPV (benign positional vertigo) 11/02/2014   Carotid artery stenosis    PER DUPLEX 10-22-2015  BILATERAL ICA 1-39%   Cataract    Coronary artery disease    CABG 2000 with LIMA> LAD, SVG > RCA and free radial > OM. 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   Gout    Gout 07/18/2010   Qualifier: Diagnosis of  By: Para March MD, Cheree Ditto     H/O eye injury    chronic changes to left eye after injury   Hand weakness 03/02/2018   Hearing loss 04/21/2013   History of TIA (transient ischemic attack)    03/ 2017  no residual after brief episode loss peripheral vision   HOH (hard of hearing)    Hyperglycemia 05/17/2016   Hyperlipemia    Hyperlipidemia 07/18/2010   Qualifier: Diagnosis of  By: Para March MD, Graham     Hypertension     HYPERTENSION, BENIGN ESSENTIAL 07/18/2010   Qualifier: Diagnosis of  By: Para March MD, Graham     Hypokalemia 01/26/2017   Knee pain 03/13/2019   Left shoulder pain 09/04/2018   Pneumothorax 10/11/2019   RIGHT    Prostate cancer (HCC) UROLOGIST-  DR GRAPEY/  ONCOLOGIST-  DR MANNING   dx 2015--- Stage T1c, Gleason 3+4, PSA 4.03, vol 44cc   PVC's (premature ventricular contractions)    Rash and nonspecific skin eruption 10/14/2015   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   S/P CABG x 05 Dec 1998   Skin lesion 10/14/2015   Stroke [facility])    TIA (transient ischemic attack) 10/14/2015   Ventral hernia 10/26/2011   Wears glasses    Past Surgical History:  Procedure Laterality Date   APPENDECTOMY  1975   CARDIAC CATHETERIZATION  12-22-2001   dr Verdis Prime   severe native vessel disease mLAD 60-70%,  total occulsion pCFX  and pRCA/  widely patent saphenous vein , free radial , and LIMA grafts/  minminal lv dysfunction, ef 60%   CHEST TUBE INSERTION Right 10/11/2019   CORONARY ARTERY BYPASS GRAFT  12/1998   LIMA to LAD,  SVG to PDA and Diagonal, Free radial graft to OM   CORONARY STENT INTERVENTION N/A 05/26/2021   Procedure: CORONARY STENT INTERVENTION;  Surgeon: Orbie Pyo, MD;  Location: [facility] INVASIVE CV LAB;  Service: Cardiovascular;  Laterality: N/A;   Exericse treadmill test  last one 01-03-2014  dr Verdis Prime   normal exercise tolerance w/ hypertensive repsonse,  no ischemic EKG changes, appropriate HR response & recovery ([facility] TM score 9;  Low Risk , PVC's w/ exertion)   EYE SURGERY     FRACTURE SURGERY     LEFT HEART CATH AND CORS/GRAFTS ANGIOGRAPHY N/A 05/26/2021   Procedure: LEFT HEART CATH AND CORS/GRAFTS ANGIOGRAPHY;  Surgeon: Orbie Pyo, MD;  Location: [facility] INVASIVE CV LAB;  Service: Cardiovascular;  Laterality: N/A;   LOOP RECORDER INSERTION N/A 12/22/2022   Procedure: LOOP RECORDER INSERTION;  Surgeon: Marinus Maw, MD;  Location: [facility] INVASIVE CV LAB;   Service: Cardiovascular;  Laterality: N/A;   PROSTATE BIOPSY     RADIOACTIVE SEED IMPLANT N/A 01/13/2016   Procedure: RADIOACTIVE SEED IMPLANT/BRACHYTHERAPY IMPLANT;  Surgeon: Barron Alvine, MD;  Location: [facility];  Service: Urology;  Laterality: N/A;   Patient Active Problem List   Diagnosis Date Noted   Acute CVA (cerebrovascular accident) (HCC) 12/22/2022   CVA (cerebral vascular accident) (HCC) 12/22/2022   Constipation 03/04/2022   CAD (coronary artery disease) 05/24/2021   NSTEMI (non-ST elevated myocardial infarction) (HCC) 05/24/2021   Pneumothorax 10/11/2019   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   Knee joint symptom 03/13/2019   Left shoulder pain 09/04/2018   Diabetes mellitus without complication (HCC) 03/02/2018   Anxiety state 03/16/2016   Pseudophakia of both eyes 02/24/2016   TIA (transient ischemic attack) 10/14/2015   BPV (benign positional vertigo) 11/02/2014   Advance care planning 05/13/2014   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   Hearing loss 04/21/2013   Ventral hernia 10/26/2011   Medicare annual wellness visit, subsequent 10/26/2011   Prostate cancer (HCC) 10/26/2011   Coronary atherosclerosis 07/19/2010   GERD 07/19/2010   Hyperlipidemia 07/18/2010   Gout 07/18/2010   Hypertension 07/18/2010    ONSET DATE: 12/22/2022  REFERRING DIAG: I63.9 (ICD-10-CM) - Acute CVA (cerebrovascular accident) (HCC)  THERAPY DIAG:  Unsteadiness on feet  Other abnormalities of gait and mobility  Muscle weakness (generalized)  History of falling  Rationale for Evaluation and Treatment: Rehabilitation  SUBJECTIVE:                                                                                                                                                                                             SUBJECTIVE  STATEMENT:  Pt reports he did the LYB yoga class last night, had a good time and felt stretched out, slept well when he got home.  Denies fall or other acute changes.   Pt accompanied by:  [name]   PERTINENT HISTORY: Pt is 72 yo male who presented on 12/21/22 with intermittent L leg weakness past 3 weeks. 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 Pt discharged on 12/22/22. Pt with hx including but not limited to CAD, HTN, TIA, anxiety, BPPV, HLD, gout, prostate CA, CABG and stents.  Pt also hard of hearing.   Vitals:   04/20/23 1156  BP: (!) 170/93  Pulse: 66        PAIN:  Are you having pain? No  PRECAUTIONS: Fall  WEIGHT BEARING RESTRICTIONS: No  FALLS: Has patient fallen in last 6 months? Yes. Number of falls 5, denies injury, just some bruises   LIVING ENVIRONMENT: Lives with: lives with their spouse and has family closeby and grand son is always home  Lives in: House/apartment Stairs: Yes: Internal: 12 steps; on right going up, level entry into house  Has following equipment at home: Dan Humphreys - 2 wheeled, Environmental consultant - 4 wheeled, shower chair, and Uses standard 2 wheeled RW, uses rollator out in the community as he can sit in it.   PLOF: Independent and Leisure: enjoys biking and his riding his UTV  PATIENT GOALS: Wants to build more strength in L leg  OBJECTIVE:   DIAGNOSTIC FINDINGS: MRI brain wo contrast 12/21/2022: Intermediate sized area of acute ischemia within the right anterior cerebral artery territory. No hemorrhage or mass effect.   COGNITION: Overall cognitive status: Within functional limits for tasks assessed   SENSATION: Light touch: WFL  Reports some tingling in feet at night.   COORDINATION: Heel to shin: slightly slower with LLE   RAM with BUE: impaired coordination with LUE   POSTURE: rounded shoulders, forward head, and weight shift right  LOWER EXTREMITY ROM:     Decr LLE knee extension AROM due to weakness  LOWER EXTREMITY MMT:    MMT Right Eval Left Eval  Hip flexion 5 4+  Hip extension    Hip abduction 5 5  Hip  adduction 5 5  Hip internal rotation    Hip external rotation    Knee flexion 5 5  Knee extension 5 3  Ankle dorsiflexion 5 4  Ankle plantarflexion    Ankle inversion    Ankle eversion    (Blank rows = not tested)  All tested in sitting.    TODAY'S TREATMENT:                                                                                                                               Therapeutic Activity:  Seated BP assessed at beginning of session. Pt noted to have hypertension though narrowly within safe limits for participation in therapy session. Pt's physician  did recommend adding back in 1/2 pill of losartin, they have a follow up with him later today. Also wary of changing meds as [name] going out of town this weekend. Vitals:   04/20/23 1156  BP: (!) 170/93  Pulse: 66    NMR Half-kneel position on red mat on floor to work on hip strengthening: Ball toss 2 x 30 reps L/R Weighted dowel volleyball 2 x 30 reps L/R   To work on dynamic balance, SLS stability: Lateral sidestepping with green band around ankles with alt L/R gumdrop taps Lateral sidstepping on purple beam with alt L/R gumdrop taps Progression from B fingertip support to no UE support    PATIENT EDUCATION: Education details: importance of continuing to monitor BP, continue HEP, PT POC with plan to d/c next week Person educated: Patient and Spouse Education method: Explanation Education comprehension: verbalized understanding  HOME EXERCISE PROGRAM: Access Code: [email or web address] URL: [email or web address] Date: 04/07/2023 Prepared by: Sherlie Ban  Exercises - Sit to Stand  - 1 x daily - 7 x weekly - 3 sets - 10 reps - Mini Squat with Counter Support  - 1 x daily - 7 x weekly - 3 sets - 10 reps - Standing Single Leg Stance with Counter Support  - 1 x daily - 5 x weekly - 3 sets - 10-15 hold - Standing Marching  - 1 x daily - 5 x weekly - 3 sets - Tandem Walking with Counter Support  -  1-2 x daily - 5 x weekly - 3 sets - focus on slowed pace, going forwards and backwards  - Side Stepping with Resistance at Thighs and Counter Support  - 1-2 x daily - 5 x weekly - 3 sets - with blue theraband around thighs, cues for mini squat position   GOALS: Goals reviewed with patient? Yes   LONG TERM GOALS: Target date: 03/23/2023  Pt will be independent with final HEP with family supervision in order to build upon functional gains made in therapy. Baseline:  Goal status: IN PROGRESS  2.  Pt will improve Berg score to 10/56 for decreased fall risk Baseline: 2/56 (7/2); 50/56 on 8/20 Goal status: MET  3.  Pt will improve gait speed with RW vs. LRAD to at least 1.8 ft/sec in order to demo improved household mobility/decr fall risk  Baseline: 44.22 seconds with RW = .74 ft/sec, pt needing one brief standing rest break due to LLE weakness and felt like LLE could buckle, 2.44 ft/sec no AD (8/23) Goal status: MET  4.  Pt will ambulate at least 400' with LRAD and supervision (over indoor/outdoor paved surfaces) in order to demo improved household/community mobility.  Baseline:  24.5 seconds with no UE support; 1000 ft outdoors with CGA (8/23) Goal status: IN PROGRESS  5.  Pt will improve 5x sit<>stand to less than or equal to 16 sec with no UE support to demonstrate improved functional strength and transfer efficiency.  Baseline: 14.8 seconds (8/20) Goal status: MET  NEW SHORT TERM GOALS=LONG TERM GOALS due to length of POC   NEW LONG TERM GOALS:  Target date: 04/30/2023  Pt will be independent with final HEP for improved strength, balance, transfers and gait. Baseline:  Goal status: IN PROGRESS  2.  Pt will improve FGA to at least a 28/30 in order to demo decr fall risk.  Baseline: 25/30  Goal status: INITIAL  3.  Pt will improve gait velocity to at least 3.0 ft/sec for improved gait efficiency and performance at  Supervision level  Baseline: 2.44 ft/sec no AD with CGA  (8/23) Goal status: INITIAL    ASSESSMENT:  CLINICAL IMPRESSION: Emphasis of skilled PT session on continuing to work on functional hip strengthening in half-kneel position and dynamic standing balance with SLS stability. Pt with decreased stability on his LLE as compared to his RLE but no LOB noted. Pt exhibits good progression to no UE support with foam beam lateral stepping and gumdrop taps. Pt continues to benefit from skilled therapy services to work towards increasing his safety and independence with functional mobility. Continue POC.    OBJECTIVE IMPAIRMENTS: Abnormal gait, decreased activity tolerance, decreased balance, decreased coordination, decreased endurance, decreased knowledge of use of DME, decreased mobility, difficulty walking, decreased ROM, decreased strength, and postural dysfunction.   ACTIVITY LIMITATIONS: carrying, lifting, bending, standing, stairs, transfers, and locomotion level  PARTICIPATION LIMITATIONS: cleaning, driving, shopping, community activity, and yard work  PERSONAL FACTORS: Age, Behavior pattern, Past/current experiences, Time since onset of injury/illness/exacerbation, and 3+ comorbidities: CAD, HTN, TIA, anxiety, BPPV, HLD, gout, prostate CA, CABG and stents  are also affecting patient's functional outcome.   REHAB POTENTIAL: Good  CLINICAL DECISION MAKING: Evolving/moderate complexity  EVALUATION COMPLEXITY: Moderate  PLAN:  PT FREQUENCY: 2x/week  PT DURATION: 8 weeks + 4 weeks (recert)  PLANNED INTERVENTIONS: Therapeutic exercises, Therapeutic activity, Neuromuscular re-education, Balance training, Gait training, Patient/Family education, Self Care, Joint mobilization, Stair training, Orthotic/Fit training, DME instructions, Aquatic Therapy, Manual therapy, and Re-evaluation  PLAN FOR NEXT SESSION:  CHECK BP!!! Work on gait across [employer], Work on [employer], standing balance, functional strength (focus on hips), SLS tasks.  Blaze pod obstacle courses., endurance and functional LE strengthening    Peter Congo, PT, DPT, CSRS  04/20/23 12:36 PM

## 2023-04-20 NOTE — Progress Notes (Unsigned)
H/o CVA.    Using medication without problems or lightheadedness: yes Chest pain with exertion:no Edema:no Short of breath:no Still on 1/2 tab losartan.   Still in therapy.  No new events or sx.    Meds, vitals, and allergies reviewed.   PMH and SH reviewed  ROS: Per HPI unless specifically indicated in ROS section   GEN: nad, alert and oriented HEENT: ncat NECK: supple w/o LA CV: rrr. PULM: ctab, no inc wob ABD: soft, +bs EXT: no edema SKIN: no acute rash  25 minutes were devoted to patient care in this encounter (this includes time spent reviewing the patient's file/history, interviewing and examining the patient, counseling/reviewing plan with patient).

## 2023-04-21 NOTE — Assessment & Plan Note (Signed)
Still on 1/2 tab losartan.   Still in therapy.  No new events or sx.    Recheck in mid December at a visit here at clinic.    If BP is consistently above 140/90 and you no lower BP readings, then try taking a full tab of losartan for 1 day.  He may be able to continue with the full tab dose but if lightheaded at all then cut back to 1/2 tab a day.   Update me as needed.   He does not need to check your BP everyday, but when checked I would do so in the middle of the day.

## 2023-04-23 ENCOUNTER — Ambulatory Visit: Payer: Medicare Other | Admitting: Physical Therapy

## 2023-04-27 ENCOUNTER — Ambulatory Visit: Payer: Medicare Other | Admitting: Physical Therapy

## 2023-04-27 VITALS — BP 154/94 | HR 64

## 2023-04-27 DIAGNOSIS — R2689 Other abnormalities of gait and mobility: Secondary | ICD-10-CM | POA: Diagnosis not present

## 2023-04-27 DIAGNOSIS — M6281 Muscle weakness (generalized): Secondary | ICD-10-CM

## 2023-04-27 DIAGNOSIS — R2681 Unsteadiness on feet: Secondary | ICD-10-CM | POA: Diagnosis not present

## 2023-04-27 DIAGNOSIS — Z9181 History of falling: Secondary | ICD-10-CM | POA: Diagnosis not present

## 2023-04-27 NOTE — Therapy (Signed)
OUTPATIENT PHYSICAL THERAPY NEURO TREATMENT  Patient Name: Tyler Guerra. MRN: [id number] DOB:31-May-1951, 71 y.o., male Today's Date: 04/27/2023   PCP: Joaquim Nam, MD  REFERRING PROVIDER: Merlene Laughter, DO     END OF SESSION:  PT End of Session - 04/27/23 1318     Visit Number 22    Number of Visits 24   recert   Date for PT Re-Evaluation 05/07/23    Authorization Type Medicare -Part A & B    PT Start Time 1315    PT Stop Time 1400    PT Time Calculation (min) 45 min    Equipment Utilized During Treatment Gait belt    Activity Tolerance Patient tolerated treatment well    Behavior During Therapy [facility] for tasks assessed/performed                       Past Medical History:  Diagnosis Date   Anxiety state 03/16/2016   Aphasia 01/26/2017   Benign localized prostatic hyperplasia with lower urinary tract symptoms (LUTS)    BPV (benign positional vertigo) 11/02/2014   Carotid artery stenosis    PER DUPLEX 10-22-2015  BILATERAL ICA 1-39%   Cataract    Coronary artery disease    CABG 2000 with LIMA> LAD, SVG > RCA and free radial > OM. 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   Gout    Gout 07/18/2010   Qualifier: Diagnosis of  By: Para March MD, Cheree Ditto     H/O eye injury    chronic changes to left eye after injury   Hand weakness 03/02/2018   Hearing loss 04/21/2013   History of TIA (transient ischemic attack)    03/ 2017  no residual after brief episode loss peripheral vision   HOH (hard of hearing)    Hyperglycemia 05/17/2016   Hyperlipemia    Hyperlipidemia 07/18/2010   Qualifier: Diagnosis of  By: Para March MD, Graham     Hypertension     HYPERTENSION, BENIGN ESSENTIAL 07/18/2010   Qualifier: Diagnosis of  By: Para March MD, Graham     Hypokalemia 01/26/2017   Knee pain 03/13/2019   Left shoulder pain 09/04/2018   Pneumothorax 10/11/2019   RIGHT    Prostate cancer (HCC) UROLOGIST-  DR GRAPEY/  ONCOLOGIST-  DR MANNING   dx 2015--- Stage T1c, Gleason 3+4, PSA 4.03, vol 44cc   PVC's (premature ventricular contractions)    Rash and nonspecific skin eruption 10/14/2015   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   S/P CABG x 05 Dec 1998   Skin lesion 10/14/2015   Stroke [facility])    TIA (transient ischemic attack) 10/14/2015   Ventral hernia 10/26/2011   Wears glasses    Past Surgical History:  Procedure Laterality Date   APPENDECTOMY  1975   CARDIAC CATHETERIZATION  12-22-2001   dr Verdis Prime   severe native vessel disease mLAD 60-70%,  total occulsion pCFX  and pRCA/  widely patent saphenous vein , free radial , and LIMA grafts/  minminal lv dysfunction, ef 60%   CHEST TUBE INSERTION Right 10/11/2019   CORONARY ARTERY BYPASS GRAFT  12/1998   LIMA to LAD,  SVG to PDA and Diagonal, Free radial graft to OM   CORONARY STENT INTERVENTION N/A 05/26/2021   Procedure: CORONARY STENT INTERVENTION;  Surgeon: Orbie Pyo, MD;  Location: [facility] INVASIVE CV LAB;  Service: Cardiovascular;  Laterality: N/A;   Exericse treadmill test  last one 01-03-2014  dr Verdis Prime   normal exercise tolerance w/ hypertensive repsonse,  no ischemic EKG changes, appropriate HR response & recovery ([facility] TM score 9;  Low Risk , PVC's w/ exertion)   EYE SURGERY     FRACTURE SURGERY     LEFT HEART CATH AND CORS/GRAFTS ANGIOGRAPHY N/A 05/26/2021   Procedure: LEFT HEART CATH AND CORS/GRAFTS ANGIOGRAPHY;  Surgeon: Orbie Pyo, MD;  Location: [facility] INVASIVE CV LAB;  Service: Cardiovascular;  Laterality: N/A;   LOOP RECORDER INSERTION N/A 12/22/2022   Procedure: LOOP RECORDER INSERTION;  Surgeon: Marinus Maw, MD;  Location: [facility] INVASIVE CV LAB;   Service: Cardiovascular;  Laterality: N/A;   PROSTATE BIOPSY     RADIOACTIVE SEED IMPLANT N/A 01/13/2016   Procedure: RADIOACTIVE SEED IMPLANT/BRACHYTHERAPY IMPLANT;  Surgeon: Barron Alvine, MD;  Location: [facility];  Service: Urology;  Laterality: N/A;   Patient Active Problem List   Diagnosis Date Noted   Acute CVA (cerebrovascular accident) (HCC) 12/22/2022   CVA (cerebral vascular accident) (HCC) 12/22/2022   Constipation 03/04/2022   CAD (coronary artery disease) 05/24/2021   NSTEMI (non-ST elevated myocardial infarction) (HCC) 05/24/2021   Pneumothorax 10/11/2019   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   Knee joint symptom 03/13/2019   Left shoulder pain 09/04/2018   Diabetes mellitus without complication (HCC) 03/02/2018   Anxiety state 03/16/2016   Pseudophakia of both eyes 02/24/2016   TIA (transient ischemic attack) 10/14/2015   BPV (benign positional vertigo) 11/02/2014   Advance care planning 05/13/2014   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   Hearing loss 04/21/2013   Ventral hernia 10/26/2011   Medicare annual wellness visit, subsequent 10/26/2011   Prostate cancer (HCC) 10/26/2011   Coronary atherosclerosis 07/19/2010   GERD 07/19/2010   Hyperlipidemia 07/18/2010   Gout 07/18/2010   Hypertension 07/18/2010    ONSET DATE: 12/22/2022  REFERRING DIAG: I63.9 (ICD-10-CM) - Acute CVA (cerebrovascular accident) (HCC)  THERAPY DIAG:  Unsteadiness on feet  Other abnormalities of gait and mobility  Muscle weakness (generalized)  History of falling  Rationale for Evaluation and Treatment: Rehabilitation  SUBJECTIVE:                                                                                                                                                                                             SUBJECTIVE  STATEMENT:  Pt reports that he did have a fall since last visit, he was more fatigued and was walking down the stairs. He landed on  his knees and slid down to his bottom. Pt was able to get back up himself, reports no injuries with the fall just scraped up his L knee a bit.  Pt has not started taking 1/2 pill extra of Losartin yet because [name] just got back, his PCP is ok with him having a higher BP vs letting it drop low again.   Pt accompanied by:  [name]   PERTINENT HISTORY: Pt is 72 yo male who presented on 12/21/22 with intermittent L leg weakness past 3 weeks. 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 Pt discharged on 12/22/22. Pt with hx including but not limited to CAD, HTN, TIA, anxiety, BPPV, HLD, gout, prostate CA, CABG and stents.  Pt also hard of hearing.   Vitals:   04/27/23 1322  BP: (!) 154/94  Pulse: 64      PAIN:  Are you having pain? No  PRECAUTIONS: Fall  WEIGHT BEARING RESTRICTIONS: No  FALLS: Has patient fallen in last 6 months? Yes. Number of falls 5, denies injury, just some bruises   LIVING ENVIRONMENT: Lives with: lives with their spouse and has family closeby and grand son is always home  Lives in: House/apartment Stairs: Yes: Internal: 12 steps; on right going up, level entry into house  Has following equipment at home: Dan Humphreys - 2 wheeled, Environmental consultant - 4 wheeled, shower chair, and Uses standard 2 wheeled RW, uses rollator out in the community as he can sit in it.   PLOF: Independent and Leisure: enjoys biking and his riding his UTV  PATIENT GOALS: Wants to build more strength in L leg  OBJECTIVE:   DIAGNOSTIC FINDINGS: MRI brain wo contrast 12/21/2022: Intermediate sized area of acute ischemia within the right anterior cerebral artery territory. No hemorrhage or mass effect.   COGNITION: Overall cognitive status: Within functional limits for tasks assessed   SENSATION: Light touch: WFL  Reports some tingling in feet at night.   COORDINATION: Heel to shin: slightly slower with LLE   RAM with BUE: impaired coordination with LUE    POSTURE: rounded shoulders, forward head, and weight shift right  LOWER EXTREMITY ROM:     Decr LLE knee extension AROM due to weakness  LOWER EXTREMITY MMT:    MMT Right Eval Left Eval  Hip flexion 5 4+  Hip extension    Hip abduction 5 5  Hip adduction 5 5  Hip internal rotation    Hip external rotation    Knee flexion 5 5  Knee extension 5 3  Ankle dorsiflexion 5 4  Ankle plantarflexion    Ankle inversion    Ankle eversion    (Blank rows = not tested)  All tested in sitting.    TODAY'S TREATMENT:  Therapeutic Activity:  Seated BP assessed at beginning of session. Pt noted to have hypertension though narrowly within safe limits for participation in therapy session. Pt's physician did recommend adding back in 1/2 pill of losartin, has not started this yet as [name] just got back.  Vitals:   04/27/23 1322  BP: (!) 154/94  Pulse: 64     NMR 6 Blaze pods on random setting for improved SLS stability, static standing balance while reaching outside BOS and across midline.  Performed on 1 minute intervals with 1 minute rest periods.  Pt requires CGA guarding. Setup: standing on 6" step with progression to standing on airex while reaching for targets across midline and outside BOS with no UE support. Round 1:  11 hits. Round 2:  14 hits. Round 3:  13 hits. Notable errors/deficits: decreased balance when standing on LLE as compared to RLE  Round 1:  20 hits. Round 2:  14 hits. Notable errors/deficits: decreased balance when standing on LLE as compared to RLE   Ambulation through obstacle course standing on airex, stepping over 6" hurdles, alt L/R cone taps while on compliant surface, weaving through cones set 1 ft apart, and walking across purple foam beam with min A. Pt exhibits most difficulty performing cone taps from compliant surface and  ambulating across foam beam. Added in dual/cognitive task during 2nd round with pt having to name musical instruments and then sports for 3rd round. Pt exhibits decreased balance and decreased speed of task performance with addition of dual task.   Cone weave task with forwards, lateral, and backwards stepping through cones with CGA to min A for balance and v/c for sequencing of task and to increase step length. Pt reports this task is 8/10 on challenge level.   PATIENT EDUCATION: Education details: continue HEP, plan to d/c next session Person educated: Patient and Spouse Education method: Explanation Education comprehension: verbalized understanding  HOME EXERCISE PROGRAM: Access Code: [email or web address] URL: [email or web address] Date: 04/07/2023 Prepared by: Sherlie Ban  Exercises - Sit to Stand  - 1 x daily - 7 x weekly - 3 sets - 10 reps - Mini Squat with Counter Support  - 1 x daily - 7 x weekly - 3 sets - 10 reps - Standing Single Leg Stance with Counter Support  - 1 x daily - 5 x weekly - 3 sets - 10-15 hold - Standing Marching  - 1 x daily - 5 x weekly - 3 sets - Tandem Walking with Counter Support  - 1-2 x daily - 5 x weekly - 3 sets - focus on slowed pace, going forwards and backwards  - Side Stepping with Resistance at Thighs and Counter Support  - 1-2 x daily - 5 x weekly - 3 sets - with blue theraband around thighs, cues for mini squat position   GOALS: Goals reviewed with patient? Yes   LONG TERM GOALS: Target date: 03/23/2023  Pt will be independent with final HEP with family supervision in order to build upon functional gains made in therapy. Baseline:  Goal status: IN PROGRESS  2.  Pt will improve Berg score to 10/56 for decreased fall risk Baseline: 2/56 (7/2); 50/56 on 8/20 Goal status: MET  3.  Pt will improve gait speed with RW vs. LRAD to at least 1.8 ft/sec in order to demo improved household mobility/decr fall risk  Baseline: 44.22 seconds  with RW = .74 ft/sec, pt needing one brief standing rest break due to LLE weakness and felt like LLE  could buckle, 2.44 ft/sec no AD (8/23) Goal status: MET  4.  Pt will ambulate at least 400' with LRAD and supervision (over indoor/outdoor paved surfaces) in order to demo improved household/community mobility.  Baseline:  24.5 seconds with no UE support; 1000 ft outdoors with CGA (8/23) Goal status: IN PROGRESS  5.  Pt will improve 5x sit<>stand to less than or equal to 16 sec with no UE support to demonstrate improved functional strength and transfer efficiency.  Baseline: 14.8 seconds (8/20) Goal status: MET  NEW SHORT TERM GOALS=LONG TERM GOALS due to length of POC   NEW LONG TERM GOALS:  Target date: 04/30/2023  Pt will be independent with final HEP for improved strength, balance, transfers and gait. Baseline:  Goal status: IN PROGRESS  2.  Pt will improve FGA to at least a 28/30 in order to demo decr fall risk.  Baseline: 25/30  Goal status: INITIAL  3.  Pt will improve gait velocity to at least 3.0 ft/sec for improved gait efficiency and performance at Supervision level  Baseline: 2.44 ft/sec no AD with CGA (8/23) Goal status: INITIAL    ASSESSMENT:  CLINICAL IMPRESSION: Emphasis of skilled PT session on working on static and dynamic standing balance, SLS stability, LE coordination, and dual tasking. Pt exhibits most challenge coordinating his LLE, performing SLS on compliant surfaces, and performing dual tasking without compensations. Plan to assess LTG and d/c PT from next session. Continue POC.    OBJECTIVE IMPAIRMENTS: Abnormal gait, decreased activity tolerance, decreased balance, decreased coordination, decreased endurance, decreased knowledge of use of DME, decreased mobility, difficulty walking, decreased ROM, decreased strength, and postural dysfunction.   ACTIVITY LIMITATIONS: carrying, lifting, bending, standing, stairs, transfers, and locomotion  level  PARTICIPATION LIMITATIONS: cleaning, driving, shopping, community activity, and yard work  PERSONAL FACTORS: Age, Behavior pattern, Past/current experiences, Time since onset of injury/illness/exacerbation, and 3+ comorbidities: CAD, HTN, TIA, anxiety, BPPV, HLD, gout, prostate CA, CABG and stents  are also affecting patient's functional outcome.   REHAB POTENTIAL: Good  CLINICAL DECISION MAKING: Evolving/moderate complexity  EVALUATION COMPLEXITY: Moderate  PLAN:  PT FREQUENCY: 2x/week  PT DURATION: 8 weeks + 4 weeks (recert)  PLANNED INTERVENTIONS: Therapeutic exercises, Therapeutic activity, Neuromuscular re-education, Balance training, Gait training, Patient/Family education, Self Care, Joint mobilization, Stair training, Orthotic/Fit training, DME instructions, Aquatic Therapy, Manual therapy, and Re-evaluation  PLAN FOR NEXT SESSION: assess LTG and d/c!    Peter Congo, PT, DPT, CSRS  04/27/23 2:01 PM

## 2023-04-30 ENCOUNTER — Encounter: Payer: Self-pay | Admitting: Physical Therapy

## 2023-04-30 ENCOUNTER — Ambulatory Visit: Payer: Medicare Other | Admitting: Physical Therapy

## 2023-04-30 VITALS — BP 126/83 | HR 73

## 2023-04-30 DIAGNOSIS — R2689 Other abnormalities of gait and mobility: Secondary | ICD-10-CM

## 2023-04-30 DIAGNOSIS — R2681 Unsteadiness on feet: Secondary | ICD-10-CM

## 2023-04-30 DIAGNOSIS — Z9181 History of falling: Secondary | ICD-10-CM | POA: Diagnosis not present

## 2023-04-30 DIAGNOSIS — M6281 Muscle weakness (generalized): Secondary | ICD-10-CM | POA: Diagnosis not present

## 2023-04-30 NOTE — Therapy (Signed)
OUTPATIENT PHYSICAL THERAPY NEURO TREATMENT / DISCHARGE  Patient Name: Tyler Guerra. MRN: [id number] DOB:02/29/1951, 72 y.o., male Today's Date: 04/30/2023  PHYSICAL THERAPY DISCHARGE SUMMARY  Visits from Start of Care: 23  Current functional level related to goals / functional outcomes: Partially met LTGs, see below for details   Remaining deficits: Falls risk and tripping noted with fatigue   Education / Equipment: Recommend use of cane at this time for safety with walking, especially with fatigue  Patient agrees to discharge. Patient goals were partially met. Patient is being discharged due to maximized rehab potential.   PCP: Joaquim Nam, MD  REFERRING PROVIDER: Merlene Laughter, DO  END OF SESSION:  PT End of Session - 04/30/23 1408     Visit Number 23    Number of Visits 24    Date for PT Re-Evaluation 05/07/23    Authorization Type Medicare -Part A & B    PT Start Time 1408    PT Stop Time 1440    PT Time Calculation (min) 32 min    Equipment Utilized During Treatment Gait belt    Activity Tolerance Patient tolerated treatment well    Behavior During Therapy WFL for tasks assessed/performed             Past Medical History:  Diagnosis Date   Anxiety state 03/16/2016   Aphasia 01/26/2017   Benign localized prostatic hyperplasia with lower urinary tract symptoms (LUTS)    BPV (benign positional vertigo) 11/02/2014   Carotid artery stenosis    PER DUPLEX 10-22-2015  BILATERAL ICA 1-39%   Cataract    Coronary artery disease    CABG 2000 with LIMA> LAD, SVG > RCA and free radial > OM. 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   Gout    Gout  07/18/2010   Qualifier: Diagnosis of  By: Para March MD, Cheree Ditto     H/O eye injury    chronic changes to left eye after injury   Hand weakness 03/02/2018   Hearing loss 04/21/2013   History of TIA (transient ischemic attack)    03/ 2017  no residual after brief episode loss peripheral vision   HOH (hard of hearing)    Hyperglycemia 05/17/2016   Hyperlipemia    Hyperlipidemia 07/18/2010   Qualifier: Diagnosis of  By: Para March MD, Graham     Hypertension    HYPERTENSION, BENIGN ESSENTIAL 07/18/2010   Qualifier: Diagnosis of  By: Para March MD, Graham     Hypokalemia 01/26/2017   Knee pain 03/13/2019   Left shoulder pain 09/04/2018   Pneumothorax 10/11/2019   RIGHT    Prostate cancer (HCC) UROLOGIST-  DR GRAPEY/  ONCOLOGIST-  DR MANNING   dx 2015--- Stage T1c, Gleason 3+4, PSA 4.03, vol 44cc   PVC's (premature ventricular contractions)    Rash and nonspecific skin eruption 10/14/2015   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   S/P CABG x 05 Dec 1998   Skin lesion 10/14/2015   Stroke [facility])    TIA (transient ischemic attack) 10/14/2015   Ventral hernia 10/26/2011   Wears glasses    Past Surgical History:  Procedure Laterality Date   APPENDECTOMY  1975   CARDIAC CATHETERIZATION  12-22-2001   dr Verdis Prime   severe native vessel disease mLAD 60-70%,  total occulsion pCFX  and pRCA/  widely patent saphenous vein , free radial , and LIMA grafts/  minminal lv dysfunction, ef 60%   CHEST TUBE INSERTION Right 10/11/2019   CORONARY ARTERY BYPASS GRAFT  12/1998   LIMA to LAD,  SVG to PDA and Diagonal, Free radial graft to OM   CORONARY STENT INTERVENTION N/A 05/26/2021   Procedure: CORONARY STENT INTERVENTION;  Surgeon: Orbie Pyo, MD;  Location: [facility];  Service: Cardiovascular;  Laterality: N/A;   Exericse treadmill test  last one 01-03-2014  dr Verdis Prime   normal exercise tolerance w/ hypertensive repsonse,  no ischemic EKG changes, appropriate HR response &  recovery ([facility] TM score 9;  Low Risk , PVC's w/ exertion)   EYE SURGERY     FRACTURE SURGERY     LEFT HEART CATH AND CORS/GRAFTS ANGIOGRAPHY N/A 05/26/2021   Procedure: LEFT HEART CATH AND CORS/GRAFTS ANGIOGRAPHY;  Surgeon: Orbie Pyo, MD;  Location: [facility];  Service: Cardiovascular;  Laterality: N/A;   LOOP RECORDER INSERTION N/A 12/22/2022   Procedure: LOOP RECORDER INSERTION;  Surgeon: Marinus Maw, MD;  Location: [facility];  Service: Cardiovascular;  Laterality: N/A;   PROSTATE BIOPSY     RADIOACTIVE SEED IMPLANT N/A 01/13/2016   Procedure: RADIOACTIVE SEED IMPLANT/BRACHYTHERAPY IMPLANT;  Surgeon: Barron Alvine, MD;  Location: [facility];  Service: Urology;  Laterality: N/A;   Patient Active Problem List   Diagnosis Date Noted   Acute CVA (cerebrovascular accident) (HCC) 12/22/2022   CVA (cerebral vascular accident) (HCC) 12/22/2022   Constipation 03/04/2022   CAD (coronary artery disease) 05/24/2021   NSTEMI (non-ST elevated myocardial infarction) (HCC) 05/24/2021   Pneumothorax 10/11/2019   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   Knee joint symptom 03/13/2019   Left shoulder pain 09/04/2018   Diabetes mellitus without complication (HCC) 03/02/2018   Anxiety state 03/16/2016   Pseudophakia of both eyes 02/24/2016   TIA (transient ischemic attack) 10/14/2015   BPV (benign positional vertigo) 11/02/2014   Advance care planning 05/13/2014   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   Hearing loss 04/21/2013   Ventral hernia 10/26/2011   Medicare annual wellness visit, subsequent 10/26/2011   Prostate cancer (HCC) 10/26/2011   Coronary atherosclerosis 07/19/2010   GERD 07/19/2010   Hyperlipidemia 07/18/2010   Gout 07/18/2010   Hypertension 07/18/2010    ONSET DATE: 12/22/2022  REFERRING DIAG: I63.9 (ICD-10-CM) - Acute CVA (cerebrovascular accident) (HCC)  THERAPY DIAG:  Unsteadiness on feet  Other abnormalities of gait and  mobility  Muscle weakness (generalized)  History of falling  Rationale for Evaluation and Treatment: Rehabilitation  SUBJECTIVE:  SUBJECTIVE STATEMENT:  Pt reports confidence in final HEP. Patient agreeable to D/C. Ambulates into session without AD.   Pt accompanied by:  [name]   PERTINENT HISTORY: Pt is 72 yo male who presented on 12/21/22 with intermittent L leg weakness past 3 weeks. 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 Pt discharged on 12/22/22. Pt with hx including but not limited to CAD, HTN, TIA, anxiety, BPPV, HLD, gout, prostate CA, CABG and stents.  Pt also hard of hearing.   Vitals:   04/30/23 1415  BP: 126/83  Pulse: 73    PAIN:  Are you having pain? No  PRECAUTIONS: Fall  WEIGHT BEARING RESTRICTIONS: No  FALLS: Has patient fallen in last 6 months? Yes. Number of falls 5, denies injury, just some bruises   LIVING ENVIRONMENT: Lives with: lives with their spouse and has family closeby and grand son is always home  Lives in: House/apartment Stairs: Yes: Internal: 12 steps; on right going up, level entry into house  Has following equipment at home: Dan Humphreys - 2 wheeled, Environmental consultant - 4 wheeled, shower chair, and Uses standard 2 wheeled RW, uses rollator out in the community as he can sit in it.   PLOF: Independent and Leisure: enjoys biking and his riding his UTV  PATIENT GOALS: Wants to build more strength in L leg  OBJECTIVE:   DIAGNOSTIC FINDINGS: MRI brain wo contrast 12/21/2022: Intermediate sized area of acute ischemia within the right anterior cerebral artery territory. No hemorrhage or mass effect.   COGNITION: Overall cognitive status: Within functional limits for tasks assessed   SENSATION: Light touch: WFL  Reports some  tingling in feet at night.   COORDINATION: Heel to shin: slightly slower with LLE   RAM with BUE: impaired coordination with LUE   POSTURE: rounded shoulders, forward head, and weight shift right  LOWER EXTREMITY ROM:     Decr LLE knee extension AROM due to weakness  LOWER EXTREMITY MMT:    MMT Right Eval Left Eval  Hip flexion 5 4+  Hip extension    Hip abduction 5 5  Hip adduction 5 5  Hip internal rotation    Hip external rotation    Knee flexion 5 5  Knee extension 5 3  Ankle dorsiflexion 5 4  Ankle plantarflexion    Ankle inversion    Ankle eversion    (Blank rows = not tested)  All tested in sitting.    TODAY'S TREATMENT:                                                                                                                               Vitals:   04/30/23 1415  BP: 126/83  Pulse: 73   TherAct:   [facility] PT Assessment - 04/30/23 0001       Standardized Balance Assessment   Standardized Balance Assessment 10 meter walk test    10 Meter Walk 2.37  ft/sec without AD (SBA) at normal pace, 2.92 ft/sec without AD (SBA) with walking quicker     Functional Gait  Assessment   Gait assessed  Yes    Gait Level Surface Walks 20 ft in less than 5.5 sec, no assistive devices, good speed, no evidence for imbalance, normal gait pattern, deviates no more than 6 in outside of the 12 in walkway width.    Change in Gait Speed Makes only minor adjustments to walking speed, or accomplishes a change in speed with significant gait deviations, deviates 10-15 in outside the 12 in walkway width, or changes speed but loses balance but is able to recover and continue walking.   tripped on intial trial with gait speed change   Gait with Horizontal Head Turns Performs head turns smoothly with no change in gait. Deviates no more than 6 in outside 12 in walkway width    Gait with Vertical Head Turns Performs head turns with no change in gait. Deviates no more than 6 in outside 12  in walkway width.    Gait and Pivot Turn Pivot turns safely within 3 sec and stops quickly with no loss of balance.    Step Over Obstacle Is able to step over 2 stacked shoe boxes taped together (9 in total height) without changing gait speed. No evidence of imbalance.    Gait with Narrow Base of Support Ambulates 4-7 steps.   4 steps   Gait with Eyes Closed Walks 20 ft, uses assistive device, slower speed, mild gait deviations, deviates 6-10 in outside 12 in walkway width. Ambulates 20 ft in less than 9 sec but greater than 7 sec.   slows pace, mild deviation   Ambulating Backwards Walks 20 ft, uses assistive device, slower speed, mild gait deviations, deviates 6-10 in outside 12 in walkway width.   slows pace   Steps Alternating feet, must use rail.   requires use of rail   Total Score 23    FGA comment: 23/50 = Low Fall Risk            PATIENT EDUCATION: Education details: progress on goals, recommend use of cane at this time given trip that patient able to self correct Person educated: Patient and Spouse Education method: Explanation Education comprehension: verbalized understanding  HOME EXERCISE PROGRAM: Access Code: [email or web address] URL: [email or web address] Date: 04/07/2023 Prepared by: Sherlie Ban  Exercises - Sit to Stand  - 1 x daily - 7 x weekly - 3 sets - 10 reps - Mini Squat with Counter Support  - 1 x daily - 7 x weekly - 3 sets - 10 reps - Standing Single Leg Stance with Counter Support  - 1 x daily - 5 x weekly - 3 sets - 10-15 hold - Standing Marching  - 1 x daily - 5 x weekly - 3 sets - Tandem Walking with Counter Support  - 1-2 x daily - 5 x weekly - 3 sets - focus on slowed pace, going forwards and backwards  - Side Stepping with Resistance at Thighs and Counter Support  - 1-2 x daily - 5 x weekly - 3 sets - with blue theraband around thighs, cues for mini squat position   GOALS: Goals reviewed with patient? Yes   LONG TERM GOALS: Target date:  03/23/2023  Pt will be independent with final HEP with family supervision in order to build upon functional gains made in therapy. Baseline:  Goal status: IN PROGRESS  2.  Pt will improve Berg score to  10/56 for decreased fall risk Baseline: 2/56 (7/2); 50/56 on 8/20 Goal status: MET  3.  Pt will improve gait speed with RW vs. LRAD to at least 1.8 ft/sec in order to demo improved household mobility/decr fall risk  Baseline: 44.22 seconds with RW = .74 ft/sec, pt needing one brief standing rest break due to LLE weakness and felt like LLE could buckle, 2.44 ft/sec no AD (8/23) Goal status: MET  4.  Pt will ambulate at least 400' with LRAD and supervision (over indoor/outdoor paved surfaces) in order to demo improved household/community mobility.  Baseline:  24.5 seconds with no UE support; 1000 ft outdoors with CGA (8/23) Goal status: IN PROGRESS  5.  Pt will improve 5x sit<>stand to less than or equal to 16 sec with no UE support to demonstrate improved functional strength and transfer efficiency.  Baseline: 14.8 seconds (8/20) Goal status: MET  NEW SHORT TERM GOALS=LONG TERM GOALS due to length of POC   NEW LONG TERM GOALS:  Target date: 04/30/2023  Pt will be independent with final HEP for improved strength, balance, transfers and gait. Baseline: Reports confidence in final HEP Goal status: MET  2.  Pt will improve FGA to at least a 28/30 in order to demo decr fall risk.  Baseline: 25/30; 23/30 Goal status: NOT MET  3.  Pt will improve gait velocity to at least 3.0 ft/sec for improved gait efficiency and performance at Supervision level  Baseline: 2.44 ft/sec no AD with CGA (8/23); 2.37 ft/sec with no AD and SBA and 2.92 ft/sec no AD with SBA when walking quicker Goal status: ADEQUATE PROGRESS  ASSESSMENT:  CLINICAL IMPRESSION: Patient D/C from physical therapy due to maximized rehab potential at this time. Patient presents with ongoing low falls risk as indicated by FGA  score; given one episode of catching foot during session recommend continued use of cane for safety at this time. Patient demonstrates independence in HEP and demonstrates ability to sustain near 3.0 ft/sec with quick gait. No further PT services indicated at this time.   OBJECTIVE IMPAIRMENTS: Abnormal gait, decreased activity tolerance, decreased balance, decreased coordination, decreased endurance, decreased knowledge of use of DME, decreased mobility, difficulty walking, decreased ROM, decreased strength, and postural dysfunction.   ACTIVITY LIMITATIONS: carrying, lifting, bending, standing, stairs, transfers, and locomotion level  PARTICIPATION LIMITATIONS: cleaning, driving, shopping, community activity, and yard work  PERSONAL FACTORS: Age, Behavior pattern, Past/current experiences, Time since onset of injury/illness/exacerbation, and 3+ comorbidities: CAD, HTN, TIA, anxiety, BPPV, HLD, gout, prostate CA, CABG and stents  are also affecting patient's functional outcome.   REHAB POTENTIAL: Good  CLINICAL DECISION MAKING: Evolving/moderate complexity  EVALUATION COMPLEXITY: Moderate  PLAN:  NA - patient D/C  Maryruth Eve, PT, DPT 04/30/23 3:34 PM

## 2023-05-08 ENCOUNTER — Other Ambulatory Visit: Payer: Self-pay | Admitting: Cardiology

## 2023-05-10 ENCOUNTER — Ambulatory Visit (INDEPENDENT_AMBULATORY_CARE_PROVIDER_SITE_OTHER): Payer: Medicare Other

## 2023-05-10 DIAGNOSIS — G459 Transient cerebral ischemic attack, unspecified: Secondary | ICD-10-CM

## 2023-05-10 LAB — CUP PACEART REMOTE DEVICE CHECK
Date Time Interrogation Session: 20241006231403
Implantable Pulse Generator Implant Date: 20240521

## 2023-05-20 ENCOUNTER — Encounter: Payer: Self-pay | Admitting: Neurology

## 2023-05-24 ENCOUNTER — Ambulatory Visit: Payer: Medicare Other | Admitting: Neurology

## 2023-05-24 NOTE — Progress Notes (Signed)
[facility] Summary Report / Loop Recorder 

## 2023-05-31 ENCOUNTER — Encounter: Payer: Self-pay | Admitting: Neurology

## 2023-05-31 ENCOUNTER — Ambulatory Visit (INDEPENDENT_AMBULATORY_CARE_PROVIDER_SITE_OTHER): Payer: Medicare Other | Admitting: Neurology

## 2023-05-31 VITALS — BP 136/81 | HR 87 | Ht 69.0 in | Wt 214.0 lb

## 2023-05-31 DIAGNOSIS — I63521 Cerebral infarction due to unspecified occlusion or stenosis of right anterior cerebral artery: Secondary | ICD-10-CM

## 2023-05-31 NOTE — Progress Notes (Signed)
Follow-up Visit   Date: 05/31/2023    Tyler Guerra. MRN: [id number] DOB: 08/10/1950    Tyler Guerra. is a 72 y.o. right-handed male with CAD, hyperlipidemia, hypertension, GERD, anxiety/depression returning to the clinic for follow-up of right ACA infarct.  The patient was accompanied to the clinic by wife who also provides collateral information.    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  Exam and coordination of the left leg has improved with PT.  He is minimally symptomatic.  Loop recorder has been placed.   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  He was on aspirin and plavix already, so switched to aspirin and Brilinta. There was concern that he may have embolic event.  Echo showed normal EF, no shunt.  He was discharged home and had loop recorder placed on 5/21.  He feels that the left leg is a little stronger, but it still tends to buckle when walking. He denies numbness/tingling of the left leg.    He is retired Tyler Guerra for [employer].  UPDATE 05/31/2023:  He is here for follow-up visit.  He completed PT and has noticed that left leg buckling sensation and weakness has improved.  Sometimes, he has some imbalance such as when trying to put pants on when he is standing, but overall he is pleased with his progressive.  No further buckling spells or weakness.  No new complaints  BP is better controlled with medication changes per PCP.   Medications:  Current Outpatient Medications on File Prior to Visit  Medication Sig Dispense Refill   allopurinol  (ZYLOPRIM) 300 MG tablet TAKE 1 TABLET DAILY 90 tablet 3   aspirin EC 81 MG tablet Take 1 tablet (81 mg total) by mouth daily. 90 tablet 3   atorvastatin (LIPITOR) 80 MG tablet Take 1 tablet (80 mg total) by mouth daily. 90 tablet 3   Cholecalciferol (VITAMIN D PO) Take 1 capsule by mouth daily.     clopidogrel (PLAVIX) 75 MG tablet Take 75 mg by mouth daily. Starting 01/22/23     escitalopram (LEXAPRO) 5 MG tablet TAKE 1 TABLET DAILY 90 tablet 3   losartan (COZAAR) 25 MG tablet Take 0.5-1 tablets (12.5-25 mg total) by mouth daily. (Patient taking differently: Take 25 mg by mouth daily.)     metoprolol tartrate (LOPRESSOR) 25 MG tablet TAKE AS INSTRUCTED BY YOUR PRESCRIBER 30 tablet 11   Misc Natural Products (BLACK CHERRY CONCENTRATE PO) Take 1 tablet by mouth at bedtime.     Multiple Vitamins-Minerals (OCUVITE PO) Take 1 capsule by mouth daily.     nitroGLYCERIN (NITROSTAT) 0.4 MG SL tablet Place 1 tablet (0.4 mg total) under the tongue every 5 (five) minutes as needed for chest pain. 25 tablet 2   pantoprazole (PROTONIX) 40 MG tablet Take 1 tablet (40 mg total) by mouth daily. 90 tablet 2   polyethylene glycol powder (GLYCOLAX/MIRALAX) 17 GM/SCOOP powder Take 17 g by mouth daily as needed for mild constipation. 238 g 0   Propylene Glycol (SYSTANE BALANCE) 0.6 % SOLN Place 1 drop into both eyes daily as needed (dry  eyes).     sodium chloride (OCEAN) 0.65 % SOLN nasal spray Place 1 spray into both nostrils as needed for congestion.     tamsulosin (FLOMAX) 0.4 MG CAPS capsule Take 1 capsule (0.4 mg total) by mouth daily.     Vitamin A 3 MG CAPS Take by mouth.     No current facility-administered medications on file prior to visit.    Allergies: No Known Allergies  Vital Signs:  BP 136/81   Pulse 87   Ht 5\' 9"  (1.753 m)   Wt 214 lb (97.1 kg)   SpO2 100%   BMI 31.60 kg/m    Neurological Exam: MENTAL STATUS including orientation to time, place, Guerra, recent and remote memory,  attention span and concentration, language, and fund of knowledge is normal.  Speech is not dysarthric.  CRANIAL NERVES:  Pupils equal round and reactive to light.  Normal conjugate, extra-ocular eye movements in all directions of gaze.  No ptosis.  Face is symmetric. Palate elevates symmetrically.  Tongue is midline.  MOTOR:  Motor strength is 5/5 in all extremities, including left leg.  No atrophy, fasciculations or abnormal movements.  No pronator drift.  Tone is normal.    MSRs:  Reflexes are 2+/4 throughout.  SENSORY:  Intact to vibration throughout.  COORDINATION/GAIT:  Normal finger-to- nose-finger.  Heel-to-shin intact bilaterally. Intact rapid alternating movements bilaterally.  Gait narrow based and stable.   Data: CTA head and neck 12/22/2022: No emergent large vessel occlusion or hemodynamically significant stenosis of the head or neck.  Aortic atherosclerosis (ICD10-I70.0).   MRI brain wo contrast 12/21/2022: Intermediate sized area of acute ischemia within the right anterior cerebral artery territory. No hemorrhage or mass effect.  Lab Results  Component Value Date   CHOL 128 12/22/2022   HDL 42 12/22/2022   LDLCALC 66 12/22/2022   LDLDIRECT 97.0 10/01/2020   TRIG 99 12/22/2022   CHOLHDL 3.0 12/22/2022   Lab Results  Component Value Date   HGBA1C 6.4 03/22/2023     Thank you for allowing me to participate in patient's care.  If I can answer any additional questions, I would be pleased to do so.    Sincerely,    Zissel Biederman K. Allena Katz, DO

## 2023-06-14 ENCOUNTER — Ambulatory Visit: Payer: BLUE CROSS/BLUE SHIELD

## 2023-06-15 LAB — CUP PACEART REMOTE DEVICE CHECK
Date Time Interrogation Session: 20241110232144
Implantable Pulse Generator Implant Date: 20240521

## 2023-06-28 ENCOUNTER — Other Ambulatory Visit: Payer: Self-pay

## 2023-06-28 MED ORDER — CLOPIDOGREL BISULFATE 75 MG PO TABS: 75.0000 mg | ORAL_TABLET | Freq: Every day | ORAL | 1 refills | Status: DC

## 2023-07-19 ENCOUNTER — Ambulatory Visit (INDEPENDENT_AMBULATORY_CARE_PROVIDER_SITE_OTHER): Payer: Medicare Other

## 2023-07-19 DIAGNOSIS — G459 Transient cerebral ischemic attack, unspecified: Secondary | ICD-10-CM

## 2023-07-19 LAB — CUP PACEART REMOTE DEVICE CHECK
Date Time Interrogation Session: 20241215231901
Implantable Pulse Generator Implant Date: 20240521

## 2023-07-20 ENCOUNTER — Ambulatory Visit: Payer: Medicare Other | Admitting: Family Medicine

## 2023-08-02 ENCOUNTER — Ambulatory Visit: Payer: Medicare Other | Admitting: Family Medicine

## 2023-08-02 VITALS — BP 132/82 | HR 68 | Temp 98.5°F | Ht 69.0 in | Wt 216.0 lb

## 2023-08-02 DIAGNOSIS — E119 Type 2 diabetes mellitus without complications: Secondary | ICD-10-CM | POA: Diagnosis not present

## 2023-08-02 DIAGNOSIS — Z8639 Personal history of other endocrine, nutritional and metabolic disease: Secondary | ICD-10-CM

## 2023-08-02 DIAGNOSIS — I639 Cerebral infarction, unspecified: Secondary | ICD-10-CM

## 2023-08-02 LAB — POCT GLYCOSYLATED HEMOGLOBIN (HGB A1C): Hemoglobin A1C: 6.3 % — AB (ref 4.0–5.6)

## 2023-08-02 MED ORDER — LOSARTAN POTASSIUM 25 MG PO TABS: 12.5000 mg | ORAL_TABLET | Freq: Every day | ORAL | Status: DC

## 2023-08-02 MED ORDER — METOPROLOL TARTRATE 25 MG PO TABS: 12.5000 mg | ORAL_TABLET | Freq: Two times a day (BID) | ORAL | Status: DC

## 2023-08-02 NOTE — Patient Instructions (Addendum)
Labs at a yearly office visit in April.  Don't completely fast- take a snack ahead of time.  Take care.  Glad to see you. Update me as needed.

## 2023-08-03 NOTE — Assessment & Plan Note (Signed)
 Non smoker.  Not diabetic.  Continue metoprolol  and losartan .  Continue atorvastatin  with aspirin  and plavix .  Routine cautions d/w pt.  He can update me as needed.  Labs at a yearly office visit in April.  Don't completely fast- take a snack ahead of time.  31 minutes were devoted to patient care in this encounter (this includes time spent reviewing the patient's file/history, interviewing and examining the patient, counseling/reviewing plan with patient).

## 2023-08-03 NOTE — Assessment & Plan Note (Signed)
 Not diabetic by A1c.   No meds.  A1c 6.3.  d/w pt at OV.

## 2023-08-23 ENCOUNTER — Ambulatory Visit (INDEPENDENT_AMBULATORY_CARE_PROVIDER_SITE_OTHER): Payer: Medicare Other

## 2023-08-23 DIAGNOSIS — G459 Transient cerebral ischemic attack, unspecified: Secondary | ICD-10-CM | POA: Diagnosis not present

## 2023-08-23 LAB — CUP PACEART REMOTE DEVICE CHECK
Date Time Interrogation Session: 20250119232327
Implantable Pulse Generator Implant Date: 20240521

## 2023-08-23 NOTE — Progress Notes (Signed)
[facility] Summary Report / Loop Recorder 

## 2023-09-27 ENCOUNTER — Ambulatory Visit (INDEPENDENT_AMBULATORY_CARE_PROVIDER_SITE_OTHER): Payer: BLUE CROSS/BLUE SHIELD

## 2023-09-27 DIAGNOSIS — G459 Transient cerebral ischemic attack, unspecified: Secondary | ICD-10-CM | POA: Diagnosis not present

## 2023-09-28 LAB — CUP PACEART REMOTE DEVICE CHECK
Date Time Interrogation Session: 20250223232054
Implantable Pulse Generator Implant Date: 20240521

## 2023-09-30 NOTE — Progress Notes (Signed)
 [facility] Summary Report / Loop Recorder

## 2023-11-01 ENCOUNTER — Ambulatory Visit (INDEPENDENT_AMBULATORY_CARE_PROVIDER_SITE_OTHER): Payer: BLUE CROSS/BLUE SHIELD

## 2023-11-01 DIAGNOSIS — G459 Transient cerebral ischemic attack, unspecified: Secondary | ICD-10-CM | POA: Diagnosis not present

## 2023-11-01 LAB — CUP PACEART REMOTE DEVICE CHECK
Date Time Interrogation Session: 20250330231620
Implantable Pulse Generator Implant Date: 20240521

## 2023-11-02 NOTE — Progress Notes (Signed)
 [facility] Summary Report / Loop Recorder

## 2023-11-02 NOTE — Addendum Note (Signed)
 Addended by: Geralyn Flash D on: 11/02/2023 01:05 PM   Modules accepted: Orders

## 2023-11-05 ENCOUNTER — Encounter: Payer: Self-pay | Admitting: Internal Medicine

## 2023-11-30 ENCOUNTER — Encounter: Payer: Self-pay | Admitting: Family Medicine

## 2023-11-30 ENCOUNTER — Ambulatory Visit (INDEPENDENT_AMBULATORY_CARE_PROVIDER_SITE_OTHER): Payer: Medicare Other | Admitting: Family Medicine

## 2023-11-30 VITALS — BP 138/80 | HR 69 | Temp 98.5°F | Ht 70.0 in | Wt 215.8 lb

## 2023-11-30 DIAGNOSIS — E785 Hyperlipidemia, unspecified: Secondary | ICD-10-CM

## 2023-11-30 DIAGNOSIS — I639 Cerebral infarction, unspecified: Secondary | ICD-10-CM

## 2023-11-30 DIAGNOSIS — R739 Hyperglycemia, unspecified: Secondary | ICD-10-CM | POA: Diagnosis not present

## 2023-11-30 DIAGNOSIS — I10 Essential (primary) hypertension: Secondary | ICD-10-CM | POA: Diagnosis not present

## 2023-11-30 DIAGNOSIS — Z0000 Encounter for general adult medical examination without abnormal findings: Secondary | ICD-10-CM

## 2023-11-30 DIAGNOSIS — Z7189 Other specified counseling: Secondary | ICD-10-CM

## 2023-11-30 DIAGNOSIS — M109 Gout, unspecified: Secondary | ICD-10-CM | POA: Diagnosis not present

## 2023-11-30 DIAGNOSIS — Z8673 Personal history of transient ischemic attack (TIA), and cerebral infarction without residual deficits: Secondary | ICD-10-CM

## 2023-11-30 DIAGNOSIS — Z125 Encounter for screening for malignant neoplasm of prostate: Secondary | ICD-10-CM

## 2023-11-30 DIAGNOSIS — Z8639 Personal history of other endocrine, nutritional and metabolic disease: Secondary | ICD-10-CM

## 2023-11-30 DIAGNOSIS — K219 Gastro-esophageal reflux disease without esophagitis: Secondary | ICD-10-CM

## 2023-11-30 LAB — COMPREHENSIVE METABOLIC PANEL WITH GFR
ALT: 13 U/L (ref 0–53)
AST: 14 U/L (ref 0–37)
Albumin: 4.2 g/dL (ref 3.5–5.2)
Alkaline Phosphatase: 105 U/L (ref 39–117)
BUN: 18 mg/dL (ref 6–23)
CO2: 27 meq/L (ref 19–32)
Calcium: 9.2 mg/dL (ref 8.4–10.5)
Chloride: 109 meq/L (ref 96–112)
Creatinine, Ser: 0.92 mg/dL (ref 0.40–1.50)
GFR: 83.44 mL/min (ref >60.00)
Glucose, Bld: 139 mg/dL — ABNORMAL HIGH (ref 70–99)
Potassium: 4 meq/L (ref 3.5–5.1)
Sodium: 142 meq/L (ref 135–145)
Total Bilirubin: 1 mg/dL (ref 0.2–1.2)
Total Protein: 6.6 g/dL (ref 6.0–8.3)

## 2023-11-30 LAB — CBC WITH DIFFERENTIAL/PLATELET
Basophils Absolute: 0 10*3/uL (ref 0.0–0.1)
Basophils Relative: 0.3 % (ref 0.0–3.0)
Eosinophils Absolute: 0.1 10*3/uL (ref 0.0–0.7)
Eosinophils Relative: 1.4 % (ref 0.0–5.0)
HCT: 40.3 % (ref 39.0–52.0)
Hemoglobin: 13.4 g/dL (ref 13.0–17.0)
Lymphocytes Relative: 30.9 % (ref 12.0–46.0)
Lymphs Abs: 1.8 10*3/uL (ref 0.7–4.0)
MCHC: 33.2 g/dL (ref 30.0–36.0)
MCV: 90.8 fl (ref 78.0–100.0)
Monocytes Absolute: 0.4 10*3/uL (ref 0.1–1.0)
Monocytes Relative: 6.7 % (ref 3.0–12.0)
Neutro Abs: 3.5 10*3/uL (ref 1.4–7.7)
Neutrophils Relative %: 60.7 % (ref 43.0–77.0)
Platelets: 167 10*3/uL (ref 150.0–400.0)
RBC: 4.44 Mil/uL (ref 4.22–5.81)
RDW: 14.4 % (ref 11.5–15.5)
WBC: 5.8 10*3/uL (ref 4.0–10.5)

## 2023-11-30 LAB — HEMOGLOBIN A1C: Hgb A1c MFr Bld: 6.7 % — ABNORMAL HIGH (ref 4.6–6.5)

## 2023-11-30 LAB — LIPID PANEL
Cholesterol: 118 mg/dL (ref 0–200)
HDL: 41.5 mg/dL (ref >39.00)
LDL Cholesterol: 53 mg/dL (ref 0–99)
NonHDL: 76.72
Total CHOL/HDL Ratio: 3
Triglycerides: 119 mg/dL (ref 0.0–149.0)
VLDL: 23.8 mg/dL (ref 0.0–40.0)

## 2023-11-30 LAB — URIC ACID: Uric Acid, Serum: 3.9 mg/dL — ABNORMAL LOW (ref 4.0–7.8)

## 2023-11-30 LAB — PSA, MEDICARE: PSA: 0.03 ng/mL — ABNORMAL LOW (ref 0.10–4.00)

## 2023-11-30 LAB — TSH: TSH: 1.86 u[IU]/mL (ref 0.35–5.50)

## 2023-11-30 MED ORDER — TAMSULOSIN HCL 0.4 MG PO CAPS: 0.4000 mg | ORAL_CAPSULE | Freq: Every day | ORAL | 3 refills | Status: DC

## 2023-11-30 MED ORDER — PANTOPRAZOLE SODIUM 40 MG PO TBEC: 40.0000 mg | DELAYED_RELEASE_TABLET | Freq: Every day | ORAL | 3 refills | Status: AC

## 2023-11-30 MED ORDER — ALLOPURINOL 300 MG PO TABS: 300.0000 mg | ORAL_TABLET | Freq: Every day | ORAL | 3 refills | Status: AC

## 2023-11-30 MED ORDER — ESCITALOPRAM OXALATE 5 MG PO TABS: 5.0000 mg | ORAL_TABLET | Freq: Every day | ORAL | 3 refills | Status: AC

## 2023-11-30 NOTE — Progress Notes (Unsigned)
 Gout. Still on allopurinol .  No ADE on med.  Rare sx.  Labs pending.    No diabetic by last A1c, d/w pt.  Prev MALB d/w pt and no change in plan.    Mood d/w pt.  Still on lexapro  5mg  a day.  "My mood is still okay."  He thought that lexapro  helped.  No ADE on med.    He had 3 days w/o BM.  D/w pt about bowel regimen.  Taking metamucil prn.  See AVS.    Hypertension:    Using medication without problems or lightheadedness: yes Chest pain with exertion: no Edema:no Short of breath: he has some SOB with sig exertion.  Not acutely worse.   He has echo pending.   He has fatigue at the end of the day.    He is still trying to compensate from the stroke.  He was able to stand today from a chair w/o pushing off from the chair.  He still has some relative L leg weakness.    Elevated Cholesterol: Using medications without problems: yes Muscle aches: not likely from statin, occ aches noted but not diffuse or constant.   Diet compliance: d/w pt.  Exercise: d/w pt.    Flu 2024 Shingles prev done PNA up-to-date Tetanus 2017 RSV prev done COVID vaccine up-to-date. Colonoscopy 2018 PSA pending 2025 Advance directive-wife designated patient were incapacitated.  His mother recently passed at age 20.  She had memory loss.  Condolences offered.    Meds, vitals, and allergies reviewed.   ROS: Per HPI unless specifically indicated in ROS section   GEN: nad, alert and oriented HEENT: ncat NECK: supple w/o LA CV: rrr. PULM: ctab, no inc wob ABD: soft, +bs EXT: no edema SKIN: well perfused.  Grip symmetric but slightly weaker with L plantar flexion.

## 2023-11-30 NOTE — Patient Instructions (Addendum)
 You could try taking metamucil daily as needed.  You could try taking dulcolax as needed.  Go to the lab on the way out.   If you have mychart we'll likely use that to update you.    Take care.  Glad to see you.

## 2023-12-01 DIAGNOSIS — Z0000 Encounter for general adult medical examination without abnormal findings: Secondary | ICD-10-CM | POA: Insufficient documentation

## 2023-12-01 NOTE — Assessment & Plan Note (Signed)
 Continue ARB and metoprolol .  He has echo pending.   See notes on labs.

## 2023-12-01 NOTE — Assessment & Plan Note (Signed)
Advance directive-wife designated patient were incapacitated. 

## 2023-12-01 NOTE — Assessment & Plan Note (Signed)
 No diabetic by last A1c, d/w pt.  Prev MALB d/w pt and no change in plan.  See notes on labs.

## 2023-12-01 NOTE — Assessment & Plan Note (Signed)
 Flu 2024 Shingles prev done PNA up-to-date Tetanus 2017 RSV prev done COVID vaccine up-to-date. Colonoscopy 2018 PSA pending 2025 Advance directive-wife designated patient were incapacitated.  His mother recently passed at age 72.  She had memory loss.  Condolences offered.

## 2023-12-01 NOTE — Assessment & Plan Note (Signed)
See notes on labs.  Continue atorvastatin. 

## 2023-12-01 NOTE — Assessment & Plan Note (Signed)
 H/o cva. Continue lexapro .  Continue aspirin , plavix , statin. Continue ARB and metoprolol .

## 2023-12-01 NOTE — Assessment & Plan Note (Signed)
Continue allopurinol.  See notes on labs. 

## 2023-12-05 ENCOUNTER — Encounter: Payer: Self-pay | Admitting: Family Medicine

## 2023-12-05 DIAGNOSIS — E119 Type 2 diabetes mellitus without complications: Secondary | ICD-10-CM | POA: Insufficient documentation

## 2023-12-06 ENCOUNTER — Ambulatory Visit (INDEPENDENT_AMBULATORY_CARE_PROVIDER_SITE_OTHER): Payer: BLUE CROSS/BLUE SHIELD

## 2023-12-06 DIAGNOSIS — G459 Transient cerebral ischemic attack, unspecified: Secondary | ICD-10-CM

## 2023-12-06 LAB — CUP PACEART REMOTE DEVICE CHECK
Date Time Interrogation Session: 20250504233034
Implantable Pulse Generator Implant Date: 20240521

## 2023-12-07 ENCOUNTER — Encounter: Payer: Self-pay | Admitting: Internal Medicine

## 2023-12-09 ENCOUNTER — Ambulatory Visit (HOSPITAL_COMMUNITY): Payer: Medicare Other | Attending: Cardiology

## 2023-12-09 ENCOUNTER — Encounter: Payer: Self-pay | Admitting: Cardiology

## 2023-12-09 DIAGNOSIS — I10 Essential (primary) hypertension: Secondary | ICD-10-CM | POA: Insufficient documentation

## 2023-12-09 LAB — ECHOCARDIOGRAM COMPLETE
AR max vel: 1.67 cm2
AV Area VTI: 1.76 cm2
AV Area mean vel: 1.84 cm2
AV Mean grad: 12 mmHg
AV Peak grad: 25.2 mmHg
Ao pk vel: 2.51 m/s
Area-P 1/2: 3.11 cm2
P 1/2 time: 580 ms
S' Lateral: 2.8 cm

## 2023-12-14 ENCOUNTER — Ambulatory Visit: Payer: Self-pay

## 2023-12-14 DIAGNOSIS — I350 Nonrheumatic aortic (valve) stenosis: Secondary | ICD-10-CM

## 2023-12-14 NOTE — Telephone Encounter (Signed)
-----   Message from Nurse Aneta Bar sent at 12/10/2023  7:46 AM EDT -----  ----- Message ----- From: Jacqueline Matsu, MD Sent: 12/09/2023   7:18 PM EDT To: [facility] Triage  Echo showed normal pumping function of the heart EF 60 to 65% with mildly thickened heart muscle.  Moderate calcification of the aortic valve with mild to moderate aortic stenosis.  Repeat echo in 1 year for aortic stenosis

## 2023-12-14 NOTE — Telephone Encounter (Signed)
 [facility] message sent with Echo results, order placed for echo in 1  year for aortic stenosis.

## 2023-12-15 NOTE — Progress Notes (Signed)
 [facility] Summary Report / Loop Recorder

## 2023-12-19 ENCOUNTER — Emergency Department (HOSPITAL_COMMUNITY)

## 2023-12-19 ENCOUNTER — Other Ambulatory Visit: Payer: Self-pay

## 2023-12-19 ENCOUNTER — Inpatient Hospital Stay (HOSPITAL_COMMUNITY)
Admission: EM | Admit: 2023-12-19 | Discharge: 2023-12-22 | DRG: 065 | Disposition: A | Discharge status: Rehabilitation Facility | Attending: Internal Medicine | Admitting: Internal Medicine

## 2023-12-19 DIAGNOSIS — C61 Malignant neoplasm of prostate: Secondary | ICD-10-CM | POA: Diagnosis not present

## 2023-12-19 DIAGNOSIS — E785 Hyperlipidemia, unspecified: Secondary | ICD-10-CM | POA: Diagnosis not present

## 2023-12-19 DIAGNOSIS — I672 Cerebral atherosclerosis: Secondary | ICD-10-CM | POA: Diagnosis not present

## 2023-12-19 DIAGNOSIS — Z6830 Body mass index (BMI) 30.0-30.9, adult: Secondary | ICD-10-CM

## 2023-12-19 DIAGNOSIS — R414 Neurologic neglect syndrome: Secondary | ICD-10-CM | POA: Diagnosis not present

## 2023-12-19 DIAGNOSIS — R29810 Facial weakness: Secondary | ICD-10-CM | POA: Diagnosis not present

## 2023-12-19 DIAGNOSIS — R410 Disorientation, unspecified: Secondary | ICD-10-CM | POA: Diagnosis not present

## 2023-12-19 DIAGNOSIS — R4182 Altered mental status, unspecified: Secondary | ICD-10-CM | POA: Diagnosis not present

## 2023-12-19 DIAGNOSIS — N400 Enlarged prostate without lower urinary tract symptoms: Secondary | ICD-10-CM | POA: Diagnosis not present

## 2023-12-19 DIAGNOSIS — I69392 Facial weakness following cerebral infarction: Secondary | ICD-10-CM | POA: Diagnosis not present

## 2023-12-19 DIAGNOSIS — H9190 Unspecified hearing loss, unspecified ear: Secondary | ICD-10-CM | POA: Diagnosis not present

## 2023-12-19 DIAGNOSIS — I6521 Occlusion and stenosis of right carotid artery: Secondary | ICD-10-CM | POA: Diagnosis not present

## 2023-12-19 DIAGNOSIS — G8194 Hemiplegia, unspecified affecting left nondominant side: Secondary | ICD-10-CM | POA: Diagnosis present

## 2023-12-19 DIAGNOSIS — R509 Fever, unspecified: Principal | ICD-10-CM | POA: Diagnosis present

## 2023-12-19 DIAGNOSIS — R29711 NIHSS score 11: Secondary | ICD-10-CM | POA: Diagnosis not present

## 2023-12-19 DIAGNOSIS — Z1152 Encounter for screening for COVID-19: Secondary | ICD-10-CM

## 2023-12-19 DIAGNOSIS — K5900 Constipation, unspecified: Secondary | ICD-10-CM | POA: Diagnosis not present

## 2023-12-19 DIAGNOSIS — Z8249 Family history of ischemic heart disease and other diseases of the circulatory system: Secondary | ICD-10-CM

## 2023-12-19 DIAGNOSIS — F32A Depression, unspecified: Secondary | ICD-10-CM | POA: Diagnosis present

## 2023-12-19 DIAGNOSIS — I6381 Other cerebral infarction due to occlusion or stenosis of small artery: Principal | ICD-10-CM | POA: Diagnosis present

## 2023-12-19 DIAGNOSIS — E119 Type 2 diabetes mellitus without complications: Secondary | ICD-10-CM | POA: Diagnosis present

## 2023-12-19 DIAGNOSIS — I350 Nonrheumatic aortic (valve) stenosis: Secondary | ICD-10-CM | POA: Diagnosis present

## 2023-12-19 DIAGNOSIS — Z823 Family history of stroke: Secondary | ICD-10-CM

## 2023-12-19 DIAGNOSIS — M109 Gout, unspecified: Secondary | ICD-10-CM | POA: Diagnosis not present

## 2023-12-19 DIAGNOSIS — H518 Other specified disorders of binocular movement: Secondary | ICD-10-CM | POA: Diagnosis present

## 2023-12-19 DIAGNOSIS — E876 Hypokalemia: Secondary | ICD-10-CM | POA: Diagnosis not present

## 2023-12-19 DIAGNOSIS — Z8673 Personal history of transient ischemic attack (TIA), and cerebral infarction without residual deficits: Secondary | ICD-10-CM | POA: Diagnosis not present

## 2023-12-19 DIAGNOSIS — E66811 Obesity, class 1: Secondary | ICD-10-CM | POA: Diagnosis present

## 2023-12-19 DIAGNOSIS — I480 Paroxysmal atrial fibrillation: Secondary | ICD-10-CM | POA: Diagnosis present

## 2023-12-19 DIAGNOSIS — E872 Acidosis, unspecified: Secondary | ICD-10-CM | POA: Diagnosis present

## 2023-12-19 DIAGNOSIS — E7849 Other hyperlipidemia: Secondary | ICD-10-CM | POA: Diagnosis not present

## 2023-12-19 DIAGNOSIS — Z9861 Coronary angioplasty status: Secondary | ICD-10-CM

## 2023-12-19 DIAGNOSIS — R0689 Other abnormalities of breathing: Secondary | ICD-10-CM | POA: Diagnosis not present

## 2023-12-19 DIAGNOSIS — R531 Weakness: Secondary | ICD-10-CM | POA: Diagnosis not present

## 2023-12-19 DIAGNOSIS — I2510 Atherosclerotic heart disease of native coronary artery without angina pectoris: Secondary | ICD-10-CM | POA: Diagnosis not present

## 2023-12-19 DIAGNOSIS — I6782 Cerebral ischemia: Secondary | ICD-10-CM | POA: Diagnosis not present

## 2023-12-19 DIAGNOSIS — I10 Essential (primary) hypertension: Secondary | ICD-10-CM | POA: Diagnosis present

## 2023-12-19 DIAGNOSIS — I639 Cerebral infarction, unspecified: Secondary | ICD-10-CM | POA: Diagnosis not present

## 2023-12-19 DIAGNOSIS — Z818 Family history of other mental and behavioral disorders: Secondary | ICD-10-CM

## 2023-12-19 DIAGNOSIS — Z800 Family history of malignant neoplasm of digestive organs: Secondary | ICD-10-CM

## 2023-12-19 DIAGNOSIS — G479 Sleep disorder, unspecified: Secondary | ICD-10-CM | POA: Diagnosis not present

## 2023-12-19 DIAGNOSIS — Z951 Presence of aortocoronary bypass graft: Secondary | ICD-10-CM

## 2023-12-19 DIAGNOSIS — Z8546 Personal history of malignant neoplasm of prostate: Secondary | ICD-10-CM

## 2023-12-19 DIAGNOSIS — R569 Unspecified convulsions: Secondary | ICD-10-CM | POA: Diagnosis not present

## 2023-12-19 DIAGNOSIS — Z6829 Body mass index (BMI) 29.0-29.9, adult: Secondary | ICD-10-CM | POA: Diagnosis not present

## 2023-12-19 DIAGNOSIS — R4587 Impulsiveness: Secondary | ICD-10-CM | POA: Diagnosis not present

## 2023-12-19 DIAGNOSIS — R29818 Other symptoms and signs involving the nervous system: Secondary | ICD-10-CM

## 2023-12-19 DIAGNOSIS — E1151 Type 2 diabetes mellitus with diabetic peripheral angiopathy without gangrene: Secondary | ICD-10-CM | POA: Diagnosis not present

## 2023-12-19 DIAGNOSIS — F39 Unspecified mood [affective] disorder: Secondary | ICD-10-CM | POA: Diagnosis not present

## 2023-12-19 DIAGNOSIS — Z7902 Long term (current) use of antithrombotics/antiplatelets: Secondary | ICD-10-CM | POA: Diagnosis not present

## 2023-12-19 DIAGNOSIS — R0902 Hypoxemia: Secondary | ICD-10-CM | POA: Diagnosis not present

## 2023-12-19 DIAGNOSIS — I69354 Hemiplegia and hemiparesis following cerebral infarction affecting left non-dominant side: Secondary | ICD-10-CM | POA: Diagnosis not present

## 2023-12-19 DIAGNOSIS — R3911 Hesitancy of micturition: Secondary | ICD-10-CM | POA: Diagnosis not present

## 2023-12-19 DIAGNOSIS — Z7982 Long term (current) use of aspirin: Secondary | ICD-10-CM | POA: Diagnosis not present

## 2023-12-19 DIAGNOSIS — Z79899 Other long term (current) drug therapy: Secondary | ICD-10-CM | POA: Diagnosis not present

## 2023-12-19 DIAGNOSIS — K219 Gastro-esophageal reflux disease without esophagitis: Secondary | ICD-10-CM | POA: Diagnosis not present

## 2023-12-19 DIAGNOSIS — F419 Anxiety disorder, unspecified: Secondary | ICD-10-CM | POA: Diagnosis present

## 2023-12-19 DIAGNOSIS — R4189 Other symptoms and signs involving cognitive functions and awareness: Secondary | ICD-10-CM | POA: Diagnosis not present

## 2023-12-19 DIAGNOSIS — N401 Enlarged prostate with lower urinary tract symptoms: Secondary | ICD-10-CM | POA: Diagnosis not present

## 2023-12-19 DIAGNOSIS — R32 Unspecified urinary incontinence: Secondary | ICD-10-CM | POA: Diagnosis present

## 2023-12-19 DIAGNOSIS — I6611 Occlusion and stenosis of right anterior cerebral artery: Secondary | ICD-10-CM | POA: Diagnosis not present

## 2023-12-19 DIAGNOSIS — R739 Hyperglycemia, unspecified: Secondary | ICD-10-CM | POA: Diagnosis not present

## 2023-12-19 DIAGNOSIS — I252 Old myocardial infarction: Secondary | ICD-10-CM

## 2023-12-19 DIAGNOSIS — G319 Degenerative disease of nervous system, unspecified: Secondary | ICD-10-CM | POA: Diagnosis not present

## 2023-12-19 DIAGNOSIS — R29704 NIHSS score 4: Secondary | ICD-10-CM | POA: Diagnosis not present

## 2023-12-19 DIAGNOSIS — Z87440 Personal history of urinary (tract) infections: Secondary | ICD-10-CM | POA: Diagnosis not present

## 2023-12-19 DIAGNOSIS — R404 Transient alteration of awareness: Secondary | ICD-10-CM | POA: Diagnosis not present

## 2023-12-19 DIAGNOSIS — Z8042 Family history of malignant neoplasm of prostate: Secondary | ICD-10-CM

## 2023-12-19 DIAGNOSIS — I6523 Occlusion and stenosis of bilateral carotid arteries: Secondary | ICD-10-CM | POA: Diagnosis not present

## 2023-12-19 LAB — I-STAT CHEM 8, ED
BUN: 26 mg/dL — ABNORMAL HIGH (ref 8–23)
Calcium, Ion: 1.13 mmol/L — ABNORMAL LOW (ref 1.15–1.40)
Chloride: 105 mmol/L (ref 98–111)
Creatinine, Ser: 1 mg/dL (ref 0.61–1.24)
Glucose, Bld: 169 mg/dL — ABNORMAL HIGH (ref 70–99)
HCT: 39 % (ref 39.0–52.0)
Hemoglobin: 13.3 g/dL (ref 13.0–17.0)
Potassium: 3.6 mmol/L (ref 3.5–5.1)
Sodium: 138 mmol/L (ref 135–145)
TCO2: 21 mmol/L — ABNORMAL LOW (ref 22–32)

## 2023-12-19 LAB — COMPREHENSIVE METABOLIC PANEL WITH GFR
ALT: 16 U/L (ref 0–44)
AST: 25 U/L (ref 15–41)
Albumin: 3.7 g/dL (ref 3.5–5.0)
Alkaline Phosphatase: 97 U/L (ref 38–126)
Anion gap: 12 (ref 5–15)
BUN: 26 mg/dL — ABNORMAL HIGH (ref 8–23)
CO2: 22 mmol/L (ref 22–32)
Calcium: 9.4 mg/dL (ref 8.9–10.3)
Chloride: 103 mmol/L (ref 98–111)
Creatinine, Ser: 1.12 mg/dL (ref 0.61–1.24)
GFR, Estimated: >60 mL/min (ref >60)
Glucose, Bld: 167 mg/dL — ABNORMAL HIGH (ref 70–99)
Potassium: 3.6 mmol/L (ref 3.5–5.1)
Sodium: 137 mmol/L (ref 135–145)
Total Bilirubin: 1.6 mg/dL — ABNORMAL HIGH (ref 0.0–1.2)
Total Protein: 7 g/dL (ref 6.5–8.1)

## 2023-12-19 LAB — CBG MONITORING, ED: Glucose-Capillary: 169 mg/dL — ABNORMAL HIGH (ref 70–99)

## 2023-12-19 LAB — DIFFERENTIAL
Abs Immature Granulocytes: 0.04 10*3/uL (ref 0.00–0.07)
Basophils Absolute: 0 10*3/uL (ref 0.0–0.1)
Basophils Relative: 0 %
Eosinophils Absolute: 0 10*3/uL (ref 0.0–0.5)
Eosinophils Relative: 0 %
Immature Granulocytes: 1 %
Lymphocytes Relative: 14 %
Lymphs Abs: 1.2 10*3/uL (ref 0.7–4.0)
Monocytes Absolute: 0.3 10*3/uL (ref 0.1–1.0)
Monocytes Relative: 4 %
Neutro Abs: 6.9 10*3/uL (ref 1.7–7.7)
Neutrophils Relative %: 81 %

## 2023-12-19 LAB — CBC
HCT: 38.7 % — ABNORMAL LOW (ref 39.0–52.0)
Hemoglobin: 13.1 g/dL (ref 13.0–17.0)
MCH: 29.9 pg (ref 26.0–34.0)
MCHC: 33.9 g/dL (ref 30.0–36.0)
MCV: 88.4 fL (ref 80.0–100.0)
Platelets: 159 10*3/uL (ref 150–400)
RBC: 4.38 MIL/uL (ref 4.22–5.81)
RDW: 12.8 % (ref 11.5–15.5)
WBC: 8.5 10*3/uL (ref 4.0–10.5)
nRBC: 0 % (ref 0.0–0.2)

## 2023-12-19 LAB — URINALYSIS, W/ REFLEX TO CULTURE (INFECTION SUSPECTED)
Bacteria, UA: NONE SEEN
Bilirubin Urine: NEGATIVE
Glucose, UA: NEGATIVE mg/dL
Hgb urine dipstick: NEGATIVE
Ketones, ur: 5 mg/dL — AB
Leukocytes,Ua: NEGATIVE
Nitrite: NEGATIVE
Protein, ur: NEGATIVE mg/dL
Specific Gravity, Urine: >1.046 — ABNORMAL HIGH (ref 1.005–1.030)
pH: 6 (ref 5.0–8.0)

## 2023-12-19 LAB — ETHANOL: Alcohol, Ethyl (B): <15 mg/dL (ref <15)

## 2023-12-19 LAB — I-STAT CG4 LACTIC ACID, ED
Lactic Acid, Venous: 2.4 mmol/L (ref 0.5–1.9)
Lactic Acid, Venous: 1.4 mmol/L (ref 0.5–1.9)

## 2023-12-19 LAB — SARS CORONAVIRUS 2 BY RT PCR: SARS Coronavirus 2 by RT PCR: NEGATIVE

## 2023-12-19 LAB — APTT: aPTT: 30 s (ref 24–36)

## 2023-12-19 LAB — PROTIME-INR
INR: 1.2 (ref 0.8–1.2)
Prothrombin Time: 15.5 s — ABNORMAL HIGH (ref 11.4–15.2)

## 2023-12-19 MED ORDER — ESCITALOPRAM OXALATE 10 MG PO TABS
5.0000 mg | ORAL_TABLET | Freq: Every day | ORAL | Status: DC
  Administered 2023-12-19 – 2023-12-22 (×4): 5 mg via ORAL
  Filled 2023-12-19 (×4): qty 1

## 2023-12-19 MED ORDER — NITROGLYCERIN 0.4 MG SL SUBL: 0.4000 mg | SUBLINGUAL_TABLET | SUBLINGUAL | Status: DC | PRN

## 2023-12-19 MED ORDER — ASPIRIN 81 MG PO TBEC
81.0000 mg | DELAYED_RELEASE_TABLET | Freq: Every day | ORAL | Status: DC
  Administered 2023-12-19 – 2023-12-22 (×4): 81 mg via ORAL
  Filled 2023-12-19 (×4): qty 1

## 2023-12-19 MED ORDER — MORPHINE SULFATE (PF) 4 MG/ML IV SOLN
4.0000 mg | Freq: Once | INTRAVENOUS | Status: AC
  Administered 2023-12-19: 4 mg via INTRAVENOUS
  Filled 2023-12-19: qty 1

## 2023-12-19 MED ORDER — VITAMIN D 25 MCG (1000 UNIT) PO TABS
1000.0000 [IU] | ORAL_TABLET | Freq: Every day | ORAL | Status: DC
  Administered 2023-12-19 – 2023-12-22 (×4): 1000 [IU] via ORAL
  Filled 2023-12-19 (×4): qty 1

## 2023-12-19 MED ORDER — ATORVASTATIN CALCIUM 80 MG PO TABS
80.0000 mg | ORAL_TABLET | Freq: Every day | ORAL | Status: DC
  Administered 2023-12-19 – 2023-12-22 (×4): 80 mg via ORAL
  Filled 2023-12-19 (×4): qty 1

## 2023-12-19 MED ORDER — PANTOPRAZOLE SODIUM 40 MG PO TBEC
40.0000 mg | DELAYED_RELEASE_TABLET | Freq: Every day | ORAL | Status: DC
  Administered 2023-12-19 – 2023-12-22 (×4): 40 mg via ORAL
  Filled 2023-12-19 (×4): qty 1

## 2023-12-19 MED ORDER — ALLOPURINOL 300 MG PO TABS
300.0000 mg | ORAL_TABLET | Freq: Every day | ORAL | Status: DC
  Administered 2023-12-19 – 2023-12-22 (×4): 300 mg via ORAL
  Filled 2023-12-19: qty 3
  Filled 2023-12-19 (×2): qty 1
  Filled 2023-12-19: qty 3

## 2023-12-19 MED ORDER — ACETAMINOPHEN 650 MG RE SUPP: 650.0000 mg | RECTAL | Status: DC | PRN

## 2023-12-19 MED ORDER — SODIUM CHLORIDE 0.9 % IV SOLN
1.0000 g | INTRAVENOUS | Status: DC
  Administered 2023-12-19 – 2023-12-20 (×2): 1 g via INTRAVENOUS
  Filled 2023-12-19 (×2): qty 10

## 2023-12-19 MED ORDER — POLYETHYLENE GLYCOL 3350 17 GM/SCOOP PO POWD: 17.0000 g | Freq: Every day | ORAL | Status: DC | PRN

## 2023-12-19 MED ORDER — IOPAMIDOL (ISOVUE-370) INJECTION 76%
100.0000 mL | Freq: Once | INTRAVENOUS | Status: AC | PRN
  Administered 2023-12-19: 100 mL via INTRAVENOUS

## 2023-12-19 MED ORDER — ACETAMINOPHEN 325 MG PO TABS
650.0000 mg | ORAL_TABLET | ORAL | Status: DC | PRN
  Administered 2023-12-19 – 2023-12-22 (×2): 650 mg via ORAL
  Filled 2023-12-19 (×3): qty 2

## 2023-12-19 MED ORDER — ENOXAPARIN SODIUM 40 MG/0.4ML IJ SOSY
40.0000 mg | PREFILLED_SYRINGE | INTRAMUSCULAR | Status: DC
  Administered 2023-12-19 – 2023-12-21 (×3): 40 mg via SUBCUTANEOUS
  Filled 2023-12-19 (×3): qty 0.4

## 2023-12-19 MED ORDER — STROKE: EARLY STAGES OF RECOVERY BOOK
Freq: Once | Status: AC
  Filled 2023-12-19: qty 1

## 2023-12-19 MED ORDER — SODIUM CHLORIDE 0.9% FLUSH
3.0000 mL | Freq: Once | INTRAVENOUS | Status: AC
  Administered 2023-12-19: 3 mL via INTRAVENOUS

## 2023-12-19 MED ORDER — POLYVINYL ALCOHOL 1.4 % OP SOLN: 1.0000 [drp] | Freq: Every day | OPHTHALMIC | Status: DC | PRN

## 2023-12-19 MED ORDER — CLOPIDOGREL BISULFATE 75 MG PO TABS
75.0000 mg | ORAL_TABLET | Freq: Every day | ORAL | Status: DC
  Administered 2023-12-19 – 2023-12-21 (×3): 75 mg via ORAL
  Filled 2023-12-19 (×3): qty 1

## 2023-12-19 MED ORDER — ACETAMINOPHEN 500 MG PO TABS
1000.0000 mg | ORAL_TABLET | Freq: Once | ORAL | Status: AC
  Administered 2023-12-19: 1000 mg via ORAL
  Filled 2023-12-19: qty 2

## 2023-12-19 MED ORDER — TAMSULOSIN HCL 0.4 MG PO CAPS
0.4000 mg | ORAL_CAPSULE | Freq: Every day | ORAL | Status: DC
  Administered 2023-12-19 – 2023-12-22 (×4): 0.4 mg via ORAL
  Filled 2023-12-19 (×4): qty 1

## 2023-12-19 MED ORDER — ACETAMINOPHEN 160 MG/5ML PO SOLN: 650.0000 mg | ORAL | Status: DC | PRN

## 2023-12-19 MED ORDER — PROPYLENE GLYCOL 0.6 % OP SOLN: 1.0000 [drp] | Freq: Every day | OPHTHALMIC | Status: DC | PRN

## 2023-12-19 MED ORDER — SODIUM CHLORIDE 0.9 % IV SOLN: INTRAVENOUS | Status: AC

## 2023-12-19 MED ORDER — LACTATED RINGERS IV BOLUS
1000.0000 mL | Freq: Once | INTRAVENOUS | Status: AC
  Administered 2023-12-19: 1000 mL via INTRAVENOUS

## 2023-12-19 MED ORDER — ONDANSETRON HCL 4 MG/2ML IJ SOLN
4.0000 mg | Freq: Once | INTRAMUSCULAR | Status: AC
  Administered 2023-12-19: 4 mg via INTRAVENOUS
  Filled 2023-12-19: qty 2

## 2023-12-19 NOTE — Progress Notes (Signed)
 Per nurse patient is unavailable for routine EEG at this time due to being at MRI. Tech will try back as schedule permits.

## 2023-12-19 NOTE — ED Notes (Signed)
 Pt transported to MRI

## 2023-12-19 NOTE — Consult Note (Signed)
 NEUROLOGY CONSULT NOTE   Date of service: Dec 19, 2023 Patient Name: Tyler Guerra. MRN:  [id number] DOB:  [birth year] Chief Complaint: "Left-sided weakness, right gaze deviation and altered mental status" Requesting Provider: Almond Army, MD  History of Present Illness  Tyler Guerra. is a 73 y.o. male with hx of hypertension, hyperlipidemia, TIA, prostate cancer, right ACA stroke in 5/24 and gout who presents with altered mental status, left-sided weakness and right gaze preference.  Patient's wife states that yesterday starting around noon or 2 PM, he had a migraine and a low-grade fever.  She suspected that he had a UTI, he kept being weak but she did not notice any weakness until EMS was called this morning for weakness and they noticed left-sided weakness and AMS-for which they activated a code stroke.  Patient reports a recent migraine but denies other symptoms.   He is not a very reliable historian as he is very uncomfortable at this time and also is exhibiting some left-sided neglect as well as jitteriness requiring frequent redirection to follow commands, although he has no significant difficulty following commands  LKW: Difficult to ascertain-he started to feel unwell around at best 2 PM yesterday but went to bed at 11 PM without much of weakness noted by family.  So the best last known well is 11 PM on 12/18/2023 although it might be prior to that. Modified rankin score: 2-Slight disability-UNABLE to perform all activities but does not need assistance IV Thrombolysis: No, outside of window EVT: No, no LVO  NIHSS components Score: Comment  1a Level of Conscious 0[x]  1[]  2[]  3[]      1b LOC Questions 0[x]  1[]  2[]       1c LOC Commands 0[x]  1[]  2[]       2 Best Gaze 0[]  1[x]  2[]       3 Visual 0[]  1[]  2[x]  3[]      4 Facial Palsy 0[]  1[x]  2[]  3[]      5a Motor Arm - left 0[]  1[x]  2[]  3[]  4[]  UN[]    5b Motor Arm - Right 0[x]  1[]  2[]  3[]  4[]  UN[]    6a Motor Leg - Left 0[]  1[]   2[x]  3[]  4[]  UN[]    6b Motor Leg - Right 0[x]  1[]  2[]  3[]  4[]  UN[]    7 Limb Ataxia 0[x]  1[]  2[]  UN[]      8 Sensory 0[]  1[x]  2[]  UN[]      9 Best Language 0[x]  1[]  2[]  3[]      10 Dysarthria 0[]  1[x]  2[]  UN[]      11 Extinct. and Inattention 0[]  1[]  2[x]       TOTAL: 11       ROS   Unable to ascertain due to altered mental status  Past History   Past Medical History:  Diagnosis Date   Anxiety state 03/16/2016   Aortic stenosis    Mild to moderate by echo 12/2023 with mean aortic valve gradient 12 mmHg   Aphasia 01/26/2017   Benign localized prostatic hyperplasia with lower urinary tract symptoms (LUTS)    BPV (benign positional vertigo) 11/02/2014   Carotid artery stenosis    PER DUPLEX 10-22-2015  BILATERAL ICA 1-39%   Cataract    Coronary artery disease    CABG 2000 with LIMA> LAD, SVG > RCA and free radial > OM. 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   Gout    Gout 07/18/2010   Qualifier: Diagnosis of  By: Vallarie Gauze MD, Tyrone Gallop     H/O eye injury    chronic changes to left eye after injury   Hand weakness 03/02/2018   Hearing loss 04/21/2013   History of TIA (transient ischemic attack)    03/ 2017  no residual after brief episode loss peripheral vision   HOH (hard of hearing)    Hyperglycemia 05/17/2016   Hyperlipemia    Hyperlipidemia 07/18/2010   Qualifier: Diagnosis of  By: Vallarie Gauze MD, Graham     Hypertension    HYPERTENSION, BENIGN ESSENTIAL 07/18/2010   Qualifier: Diagnosis of  By: Vallarie Gauze MD, Graham     Hypokalemia 01/26/2017   Knee pain 03/13/2019   Left shoulder pain 09/04/2018   Pneumothorax 10/11/2019   RIGHT    Prostate cancer (HCC) UROLOGIST-  DR GRAPEY/   ONCOLOGIST-  DR MANNING   dx 2015--- Stage T1c, Gleason 3+4, PSA 4.03, vol 44cc   PVC's (premature ventricular contractions)    Rash and nonspecific skin eruption 10/14/2015   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   S/P CABG x 05 Dec 1998   Skin lesion 10/14/2015   Stroke [facility])    TIA (transient ischemic attack) 10/14/2015   Ventral hernia 10/26/2011   Wears glasses     Past Surgical History:  Procedure Laterality Date   APPENDECTOMY  1975   CARDIAC CATHETERIZATION  12-22-2001   dr Kay Parson   severe native vessel disease mLAD 60-70%,  total occulsion pCFX  and pRCA/  widely patent saphenous vein , free radial , and LIMA grafts/  minminal lv dysfunction, ef 60%   CHEST TUBE INSERTION Right 10/11/2019   CORONARY ARTERY BYPASS GRAFT  12/1998   LIMA to LAD,  SVG to PDA and Diagonal, Free radial graft to OM   CORONARY STENT INTERVENTION N/A 05/26/2021   Procedure: CORONARY STENT INTERVENTION;  Surgeon: Kyra Phy, MD;  Location: [facility];  Service: Cardiovascular;  Laterality: N/A;   Exericse treadmill test  last one 01-03-2014  dr Kay Parson   normal exercise tolerance w/ hypertensive repsonse,  no ischemic EKG changes, appropriate HR response & recovery ([facility] TM score 9;  Low Risk , PVC's w/ exertion)   EYE SURGERY     FRACTURE SURGERY     LEFT HEART CATH AND CORS/GRAFTS ANGIOGRAPHY N/A 05/26/2021   Procedure: LEFT HEART CATH AND CORS/GRAFTS ANGIOGRAPHY;  Surgeon: Kyra Phy, MD;  Location: [facility];  Service: Cardiovascular;  Laterality: N/A;   LOOP RECORDER INSERTION N/A 12/22/2022   Procedure: LOOP RECORDER INSERTION;  Surgeon: Tammie Fall, MD;  Location: [facility];  Service: Cardiovascular;  Laterality: N/A;   PROSTATE BIOPSY     RADIOACTIVE SEED IMPLANT N/A 01/13/2016   Procedure: RADIOACTIVE SEED IMPLANT/BRACHYTHERAPY IMPLANT;  Surgeon: Ann Barnacle, MD;  Location: [facility];  Service: Urology;   Laterality: N/A;    Family History: Family History  Problem Relation Age of Onset   Dementia Mother    Heart disease Mother    Cancer Father        prostate in eighties   Prostate cancer Father    Heart disease Father    Stroke Father    Cancer Sister  breast   Anxiety disorder Sister    Colon cancer Maternal Grandfather    Cancer Paternal Uncle        prostate    Social History  reports that he has never smoked. He has never used smokeless tobacco. He reports that he does not drink alcohol and does not use drugs.  No Known Allergies  Medications  No current facility-administered medications for this encounter.  Current Outpatient Medications:    allopurinol  (ZYLOPRIM ) 300 MG tablet, Take 1 tablet (300 mg total) by mouth daily., Disp: 90 tablet, Rfl: 3   aspirin  EC 81 MG tablet, Take 1 tablet (81 mg total) by mouth daily., Disp: 90 tablet, Rfl: 3   atorvastatin  (LIPITOR ) 80 MG tablet, Take 1 tablet (80 mg total) by mouth daily., Disp: 90 tablet, Rfl: 3   Cholecalciferol (VITAMIN D PO), Take 1 capsule by mouth daily., Disp: , Rfl:    clopidogrel  (PLAVIX ) 75 MG tablet, Take 1 tablet (75 mg total) by mouth daily., Disp: 90 tablet, Rfl: 1   escitalopram  (LEXAPRO ) 5 MG tablet, Take 1 tablet (5 mg total) by mouth daily., Disp: 90 tablet, Rfl: 3   losartan  (COZAAR ) 25 MG tablet, Take 0.5 tablets (12.5 mg total) by mouth daily., Disp: , Rfl:    metoprolol  tartrate (LOPRESSOR ) 25 MG tablet, Take 0.5 tablets (12.5 mg total) by mouth 2 (two) times daily., Disp: , Rfl:    Misc Natural Products (BLACK CHERRY CONCENTRATE PO), Take 1 tablet by mouth at bedtime., Disp: , Rfl:    Multiple Vitamins-Minerals (OCUVITE PO), Take 1 capsule by mouth daily., Disp: , Rfl:    nitroGLYCERIN  (NITROSTAT ) 0.4 MG SL tablet, Place 1 tablet (0.4 mg total) under the tongue every 5 (five) minutes as needed for chest pain., Disp: 25 tablet, Rfl: 2   pantoprazole  (PROTONIX ) 40 MG tablet, Take 1 tablet (40  mg total) by mouth daily., Disp: 90 tablet, Rfl: 3   polyethylene glycol powder (GLYCOLAX /MIRALAX ) 17 GM/SCOOP powder, Take 17 g by mouth daily as needed for mild constipation., Disp: 238 g, Rfl: 0   Propylene Glycol (SYSTANE BALANCE) 0.6 % SOLN, Place 1 drop into both eyes daily as needed (dry eyes)., Disp: , Rfl:    sodium chloride  (OCEAN) 0.65 % SOLN nasal spray, Place 1 spray into both nostrils as needed for congestion., Disp: , Rfl:    tamsulosin  (FLOMAX ) 0.4 MG CAPS capsule, Take 1 capsule (0.4 mg total) by mouth daily., Disp: 90 capsule, Rfl: 3   Vitamin A 3 MG CAPS, Take by mouth., Disp: , Rfl:   Vitals   Vitals:   12/19/23 1100 12/19/23 1200 12/19/23 1236 12/19/23 1238  BP:  (!) 148/114 (!) 164/80   Pulse:  81 83   Resp:   20   Temp:    (!) 100.8 F (38.2 C)  TempSrc:    Oral  SpO2:  98% 99%   Weight: 95.4 kg       Body mass index is 30.18 kg/m.  Physical Exam   Constitutional: Appears well-developed and well-nourished.  Psych: Affect appropriate to situation.  Eyes: No scleral injection.  HENT: No OP obstruction.  Head: Normocephalic.  Cardiovascular: Normal rate and regular rhythm.  Respiratory: Effort normal, non-labored breathing.  Skin: WDI.   Neurologic Examination    NEURO:  Mental Status: Alert and oriented to person and place, able to state correct month but unable to give history of present illness other than to say he has had a migraine recently.  Somewhat restless and impulsive, requiring frequent redirection.  He is able to follow single step commands but required repeated prompting for two-step commands Speech/Language: speech is with mild dysarthria but no aphasia.    Cranial Nerves:  II: PERRL.  Left hemianopsia III, IV, VI: He has somewhat of rightward gaze preference and has some movement towards midline but does not reach midline.  He does not have a forced gaze V: Sensation is intact to light touch and absent on the left VII: Left facial  droop VIII: hearing intact to voice. IX, X: Voice is slightly dysarthric XII: tongue is midline without fasciculations. Motor: Able to move all 4 extremities with good antigravity strength, but drift present in left arm and left leg Tone: is normal and bulk is normal Sensation- Intact to light touch bilaterally.  Left-sided extinction present on double simultaneous stimulation Coordination: FTN intact on the right, unable to perform on the left Gait- deferred   Labs/Imaging/Neurodiagnostic studies   CBC:  Recent Labs  Lab January 09, 2024 1154 2024/01/09 1200  WBC 8.5  --   NEUTROABS 6.9  --   HGB 13.1 13.3  HCT 38.7* 39.0  MCV 88.4  --   PLT 159  --    Basic Metabolic Panel:  Lab Results  Component Value Date   NA 138 01/09/2024   K 3.6 09-Jan-2024   CO2 27 11/30/2023   GLUCOSE 169 (H) 09-Jan-2024   BUN 26 (H) Jan 09, 2024   CREATININE 1.00 09-Jan-2024   CALCIUM  9.2 11/30/2023   GFRNONAA >60 12/22/2022   GFRAA >60 10/12/2019   Lipid Panel:  Lab Results  Component Value Date   LDLCALC 53 11/30/2023   HgbA1c:  Lab Results  Component Value Date   HGBA1C 6.7 (H) 11/30/2023   Urine Drug Screen:     Component Value Date/Time   LABOPIA NONE DETECTED 01/26/2017 2145   COCAINSCRNUR NONE DETECTED 01/26/2017 2145   LABBENZ NONE DETECTED 01/26/2017 2145   AMPHETMU NONE DETECTED 01/26/2017 2145   THCU NONE DETECTED 01/26/2017 2145   LABBARB NONE DETECTED 01/26/2017 2145    Alcohol Level     Component Value Date/Time   ETH <10 12/21/2022 1632   INR  Lab Results  Component Value Date   INR 1.2 2024-01-09   APTT  Lab Results  Component Value Date   APTT 30 01/09/24   AED levels: No results found for: "PHENYTOIN", "ZONISAMIDE", "LAMOTRIGINE", "LEVETIRACETA"  CT Head without contrast(Personally reviewed): No acute abnormality, atrophy and chronic small vessel ischemic disease  CT angio Head and Neck with contrast(Personally reviewed): Focal occlusion versus severe  near occlusive stenosis of right ACA A3 segment, unsure if this was present on prior CT in May 2024 because of poor quality due to motion degradation  CT perfusion: No core or penumbra infarct-study might be limited by contrast timing  MRI Brain(Personally reviewed): Pending  Neurodiagnostics rEEG:  Pending  ASSESSMENT   Tyler Guerra. is a 73 y.o. male  with hx of hypertension, hyperlipidemia, TIA, prostate cancer, right ACA stroke in May 2024 and gout who presents with altered mental status, left-sided weakness, left-sided neglect and right gaze deviation.  Patient's wife reports that yesterday, he complained of a migraine and had a low-grade fever, and that this morning he awoke with left-sided weakness, left-sided neglect, altered mental status and right gaze deviation.  On exam, he is noted to have drift in the left upper and lower extremities as well as neglect to double simultaneous stimulation and right gaze  deviation.  He is febrile to 100.8 but does not have a leukocytosis.  Initial CT head demonstrated no acute abnormality, and no LVO or core infarct was found on CT a with CT perfusion.  Will obtain MRI brain as well as resting EEG, as patient's old stroke puts him at high risk for seizure activity.  Unwitnessed seizure with postictal state could be the cause of patient's current symptoms.  Toxic metabolic encephalopathy is also on the differential.  RECOMMENDATIONS  -MRI brain, proceed with stroke workup if positive - Routine EEG - Workup for causes of toxic metabolic encephalopathy per primary team ______________________________________________________________________  Patient seen by NP with MD, MD to edit note as needed.  Signed, Cortney E Bucky Cardinal, NP [facility]   Attending Neurohospitalist Addendum Patient seen and examined with APP/Resident. Agree with the history and physical as documented above. Agree with the plan as documented, which I helped  formulate. I have independently reviewed the chart, obtained history, review of systems and examined the patient.I have personally reviewed pertinent head/neck/spine imaging (CT/MRI). Please feel free to call with any questions.  -- Tona Francis, MD Neurologist [facility] Pager: [phone]

## 2023-12-19 NOTE — Procedures (Signed)
 Patient Name: Tyler Guerra.  MRN: [id number]  Epilepsy Attending: Arleene Lack  Referring Physician/Provider: Colon Dear, NP Date: 12/19/2023 Duration: 21.27 mins  Patient history: 73yo M with altered mental status, left-sided weakness, left-sided neglect and right gaze deviation. EEG to evaluate for seizure  Level of alertness: Awake  AEDs during EEG study: None  Technical aspects: This EEG study was done with scalp electrodes positioned according to the 10-20 International system of electrode placement. Electrical activity was reviewed with band pass filter of 1-70Hz , sensitivity of 7 uV/mm, display speed of 38mm/sec with a 60Hz  notched filter applied as appropriate. EEG data were recorded continuously and digitally stored.  Video monitoring was available and reviewed as appropriate.  Description: EEG showed continuous generalized 3 to 6 Hz theta-delta slowing. Hyperventilation and photic stimulation were not performed.     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 No seizures or epileptiform discharges were seen throughout the recording.  Analicia Skibinski O Sybilla Malhotra

## 2023-12-19 NOTE — Progress Notes (Addendum)
 Same-day progress note  EEG: pending MRI: small stroke in right periatrial WM Can admit for stroke w/u as below  -Admit to hospitalist or observation -Telemetry monitoring -Allow for permissive hypertension for the first 24-48h - only treat PRN if SBP >220 mmHg. Blood pressures can be gradually normalized to SBP<140 upon discharge. -MRI brain without contrast -Echocardiogram -HgbA1c, fasting lipid panel -Frequent neuro checks -Prophylactic therapy- ASA+Plavix  -Atorvastatin  80 mg PO daily -Risk factor modification -PT consult, OT consult, Speech consult -Has a loop recorder - recommend interrogation.If Afib found on telemetry, will need anticoagulation. Decision pending imaging and stroke team rounding. -Management of fever per primary team.  Please page stroke NP/PA/MD (listed on AMION)  from 8am-4 pm as this patient will be followed by the stroke team at this point.   -- Tona Francis, MD Neurologist [facility]

## 2023-12-19 NOTE — ED Provider Notes (Signed)
 [place] EMERGENCY DEPARTMENT AT [facility] Provider Note   CSN: [id number] Arrival date & time: 12/19/23  1151  An emergency department physician performed an initial assessment on this suspected stroke patient at 1153.  History  Chief Complaint  Patient presents with   Code Stroke    Tyler Guerra. is a 73 y.o. male.  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  Patient and wife gives a history.  Patient had very reported some headaches yesterday but seem to be at his baseline when he went to bed around 11 last night.  However when he woke up this morning his wife noted that he was not acting himself.  He was not able to stand and walk.  He seemed to have worsening deficits on the left side.  EMS arrived and noted that patient had left-sided weakness, was unable to follow commands and seemed to have hemineglect.  They also noted patient may have a fever.  Patient reports overall he just feels unwell.  EMS reports they patient has had improvement in symptoms to where now he is able to answer questions and follow commands.  Patient does endorse taking aspirin  and Plavix .  He is still complaining of a headache and denies chest pain, shortness of breath, abdominal pain, nausea or vomiting.  The history is provided by the patient, the EMS personnel and medical records.       Home Medications Prior to Admission medications   Medication Sig Start Date End Date Taking? Authorizing Provider  allopurinol  (ZYLOPRIM ) 300 MG tablet Take 1 tablet (300 mg total) by mouth daily. 11/30/23   Donnie Galea, MD  aspirin  EC 81 MG tablet Take 1 tablet (81 mg total) by mouth daily. 06/01/19   Arty Binning, MD  atorvastatin  (LIPITOR ) 80 MG tablet Take 1 tablet (80 mg total) by mouth daily. 03/22/23    Donnie Galea, MD  Cholecalciferol (VITAMIN D PO) Take 1 capsule by mouth daily.    [provider]  clopidogrel  (PLAVIX ) 75 MG tablet Take 1 tablet (75 mg total) by mouth daily. 06/28/23   Jacqueline Matsu, MD  escitalopram  (LEXAPRO ) 5 MG tablet Take 1 tablet (5 mg total) by mouth daily. 11/30/23   Donnie Galea, MD  losartan  (COZAAR ) 25 MG tablet Take 0.5 tablets (12.5 mg total) by mouth daily. 08/02/23   Donnie Galea, MD  metoprolol  tartrate (LOPRESSOR ) 25 MG tablet Take 0.5 tablets (12.5 mg total) by mouth 2 (two) times daily. 08/02/23   Donnie Galea, MD  Misc Natural Products (BLACK CHERRY CONCENTRATE PO) Take 1 tablet by mouth at bedtime.    [provider]  Multiple Vitamins-Minerals (OCUVITE PO) Take 1 capsule by mouth daily.    [provider]  nitroGLYCERIN  (NITROSTAT ) 0.4 MG SL tablet Place 1 tablet (0.4 mg total) under the tongue every 5 (five) minutes as needed for chest pain. 05/27/21   Goodrich, Callie E, PA-C  pantoprazole  (PROTONIX ) 40 MG tablet Take 1 tablet (40 mg total) by mouth daily. 11/30/23   Donnie Galea, MD  polyethylene glycol powder (GLYCOLAX /MIRALAX ) 17 GM/SCOOP powder Take 17 g by mouth daily as needed for mild constipation. 12/22/22   Aura Leeds Latif, DO  Propylene Glycol (SYSTANE BALANCE) 0.6 % SOLN Place 1 drop into both eyes daily  as needed (dry eyes).    [provider]  sodium chloride  (OCEAN) 0.65 % SOLN nasal spray Place 1 spray into both nostrils as needed for congestion.    [provider]  tamsulosin  (FLOMAX ) 0.4 MG CAPS capsule Take 1 capsule (0.4 mg total) by mouth daily. 11/30/23   Donnie Galea, MD  Vitamin A 3 MG CAPS Take by mouth.    [provider]      Allergies    Patient has no known allergies.    Review of Systems   Review of Systems  Physical Exam Updated Vital Signs BP (!) 164/80   Pulse 83   Temp (!) 100.8 F (38.2 C) (Oral)   Resp 20   Wt 95.4 kg   SpO2 99%    BMI 30.18 kg/m  Physical Exam Vitals and nursing note reviewed.  Constitutional:      General: He is not in acute distress.    Appearance: He is well-developed.  HENT:     Head: Normocephalic and atraumatic.  Eyes:     Conjunctiva/sclera: Conjunctivae normal.     Pupils: Pupils are equal, round, and reactive to light.  Cardiovascular:     Rate and Rhythm: Normal rate and regular rhythm.     Pulses: Normal pulses.     Heart sounds: No murmur heard. Pulmonary:     Effort: Pulmonary effort is normal. No respiratory distress.     Breath sounds: Normal breath sounds. No wheezing or rales.  Abdominal:     General: There is no distension.     Palpations: Abdomen is soft.     Tenderness: There is no abdominal tenderness. There is no guarding or rebound.  Musculoskeletal:        General: No tenderness. Normal range of motion.     Cervical back: Normal range of motion and neck supple.  Skin:    General: Skin is warm and dry.     Findings: No erythema or rash.     Comments: Warm to the touch  Neurological:     Mental Status: He is alert and oriented to person, place, and time.     Comments: Patient has left-sided hemineglect.  Uncoordinated movement of the left arm and leg but he is also neglecting.  5 of 5 strength in the right upper and lower extremity without pronator drift.  Patient is not able to voluntarily move his left arm or leg.  No noted aphasia  Psychiatric:        Behavior: Behavior normal.     ED Results / Procedures / Treatments   Labs (all labs ordered are listed, but only abnormal results are displayed) Labs Reviewed  PROTIME-INR - Abnormal; Notable for the following components:      Result Value   Prothrombin Time 15.5 (*)    All other components within normal limits  CBC - Abnormal; Notable for the following components:   HCT 38.7 (*)    All other components within normal limits  COMPREHENSIVE METABOLIC PANEL WITH GFR - Abnormal; Notable for the following  components:   Glucose, Bld 167 (*)    BUN 26 (*)    Total Bilirubin 1.6 (*)    All other components within normal limits  I-STAT CHEM 8, ED - Abnormal; Notable for the following components:   BUN 26 (*)    Glucose, Bld 169 (*)    Calcium , Ion 1.13 (*)    TCO2 21 (*)    All other components within  normal limits  CBG MONITORING, ED - Abnormal; Notable for the following components:   Glucose-Capillary 169 (*)    All other components within normal limits  I-STAT CG4 LACTIC ACID, ED - Abnormal; Notable for the following components:   Lactic Acid, Venous 2.4 (*)    All other components within normal limits  SARS CORONAVIRUS 2 BY RT PCR  APTT  DIFFERENTIAL  ETHANOL  URINALYSIS, W/ REFLEX TO CULTURE (INFECTION SUSPECTED)  I-STAT CG4 LACTIC ACID, ED    EKG None  Radiology MR BRAIN WO CONTRAST Result Date: 12/19/2023 CLINICAL DATA:  Provided history: Stroke, follow-up. EXAM: MRI HEAD WITHOUT CONTRAST TECHNIQUE: Multiplanar, multiecho pulse sequences of the brain and surrounding structures were obtained without intravenous contrast. COMPARISON:  Non-contrast head CT and CT angiogram head/neck performed earlier today 12/19/2023. Brain MRI 12/21/2022. FINDINGS: Brain: Generalized cerebral atrophy. 3 mm acute infarct within the right periatrial white matter (series 9, image 53). Known chronic right anterior cerebral artery territory infarct within the medial right frontal lobe and corpus callosum. Adjacent chronic infarct within the right frontal lobe periventricular white matter, extending inferiorly to the right basal ganglia. Small chronic cortical infarct within the anterior left frontal lobe. Chronic lacunar infarcts within the cerebral hemispheric white matter on the left. Additional small chronic lacunar infarct within the left subinsular white matter. Background advanced patchy and confluent T2 FLAIR hyperintense signal abnormality within the cerebral white matter, nonspecific but compatible  with chronic small vessel ischemic disease. Few punctate chronic microhemorrhages scattered within the supratentorial brain. No evidence of an intracranial mass. No extra-axial fluid collection. No midline shift. Vascular: Please see CTA head/neck performed earlier today. Skull and upper cervical spine: No focal worrisome marrow lesion. Slight C2-C3 grade 1 anterolisthesis. Sinuses/Orbits: No mass or acute finding within the imaged orbits. Prior left ocular lens replacement. No significant paranasal sinus disease. IMPRESSION: 1. 3 mm acute infarct within the right periatrial white matter. 2. Background parenchymal atrophy, chronic small vessel disease and redemonstrated chronic infarcts as described. Electronically Signed   By: Bascom Lily D.O.   On: 12/19/2023 14:46   DG Chest Port 1 View Result Date: 12/19/2023 CLINICAL DATA:  Fever.  Neurologic deficits. EXAM: PORTABLE CHEST 1 VIEW COMPARISON:  AP chest 05/22/2021 FINDINGS: Status post median sternotomy and CABG. Cardiac silhouette is at the upper limits of normal size. Mediastinal contours are within limits. Interval decrease in now moderately low lung volumes. No definite acute airspace opacity. No pleural effusion or pneumothorax. No acute skeletal abnormality. IMPRESSION: Interval decrease in now moderately low lung volumes. No definite acute airspace opacity. Electronically Signed   By: Bertina Broccoli M.D.   On: 12/19/2023 13:46   CT ANGIO HEAD NECK W WO CM W PERF (CODE STROKE) Result Date: 12/19/2023 CLINICAL DATA:  Provided history: Neuro deficit, acute, stroke suspected. EXAM: CT ANGIOGRAPHY HEAD AND NECK CT PERFUSION BRAIN TECHNIQUE: Multidetector CT imaging of the head and neck was performed using the standard protocol during bolus administration of intravenous contrast. Multiplanar CT image reconstructions and MIPs were obtained to evaluate the vascular anatomy. Carotid stenosis measurements (when applicable) are obtained utilizing NASCET  criteria, using the distal internal carotid diameter as the denominator. Multiphase CT imaging of the brain was performed following IV bolus contrast injection. Subsequent parametric perfusion maps were calculated using RAPID software. RADIATION DOSE REDUCTION: This exam was performed according to the departmental dose-optimization program which includes automated exposure control, adjustment of the mA and/or kV according to patient size and/or use of iterative  reconstruction technique. CONTRAST:  Administered contrast not known at this time. COMPARISON:  Noncontrast head CT performed earlier today 12/19/2023. CT angiogram head/neck 12/22/2022. FINDINGS: CTA NECK FINDINGS Aortic arch: Standard aortic branching. Atherosclerotic plaque within the visualized thoracic aorta and proximal major branch vessels of the neck. Streak/beam hardening artifact arising from a dense contrast bolus partially obscures the left subclavian artery. Within this limitation, there is no appreciable hemodynamically significant innominate or proximal subclavian artery stenosis. Right carotid system: CCA and ICA patent within the neck without stenosis. Minimal atherosclerotic plaque at the carotid bifurcation. Left carotid system: CCA and ICA patent within the neck without stenosis. Minimal atherosclerotic plaque at the CCA origin. Mild atherosclerotic plaque about the carotid bifurcation and within the proximal ICA. Vertebral arteries: Patent within the neck without stenosis or significant atherosclerotic disease. The right vertebral artery is dominant. Skeleton: Nonspecific reversal of the expected cervical lordosis. Spondylosis at the cervical and visualized upper thoracic levels. Poor dentition. Other neck: No neck mass or cervical lymphadenopathy. Upper chest: No consolidation within the imaged lung apices. Prior median sternotomy. Review of the MIP images confirms the above findings CTA HEAD FINDINGS Anterior circulation: The  intracranial internal carotid arteries are patent. Atherosclerotic plaque within both vessels. Most notably, calcified plaque results in up to moderate stenosis of the paraclinoid right ICA. The M1 middle cerebral arteries are patent. No M2 proximal branch occlusion or high-grade proximal stenosis. The right anterior cerebral artery A1 segment is markedly hypoplastic or developmentally absent. Focal occlusion versus severe near occlusive stenosis of the right anterior cerebral artery A3 segment (series 5, image 76) (series 10, image 45). It is difficult to determine if this was present on the prior CTA of 12/22/2022 (due to the degree of motion degradation on the prior exam). Immediately distal to this, additional severe stenosis within the right anterior cerebral artery A3 segment. The left anterior cerebral artery is patent. No intracranial aneurysm is identified. Posterior circulation: The intracranial vertebral arteries are patent. Mild atherosclerotic plaque within the right vertebral artery at the V3/V4 junction. Atherosclerotic plaque at the left vertebral artery V3/V4 junction with no more than mild stenosis. The basilar artery is patent. The posterior cerebral arteries are patent. Hypoplastic right P1 segment with sizable right posterior communicating artery. The left posterior communicating artery is diminutive Venous sinuses: Evaluation for dural venous sinus thrombosis is limited due to contrast timing. Anatomic variants: As described. Review of the MIP images confirms the above findings CT Brain Perfusion Findings: Poor source data quality limits reliability of the perfusion values. Within this limitation, findings are as follows. CBF (<30%) Volume: 0mL Perfusion (Tmax>6.0s) volume: 0mL Mismatch Volume: 0mL Infarction location:None identified. CTA head impression #1 called by telephone at the time of interpretation on 12/19/2023 at 12:43 pm to provider [facility] , who verbally acknowledged these  results. IMPRESSION: CTA neck: 1. The common carotid and internal carotid arteries are patent within the neck without stenosis. Mild atherosclerotic plaque bilaterally, as described. 2. The vertebral arteries are patent within the neck without stenosis or significant atherosclerotic disease. 3. Aortic Atherosclerosis (ICD10-I70.0). CTA head: 1. Focal occlusion versus severe, near-occlusive stenosis of the right anterior cerebral artery A3 segment. It is difficult to determine if this was present on the prior CTA of 12/22/2022 (due to the degree of motion degradation on the prior exam). Immediately downstream from this, additional severe stenosis within the right anterior cerebral artery A3 segment. 2. Background intracranial atherosclerotic disease. Most notably, calcified plaque results in up to moderate  stenosis of the paraclinoid right internal carotid artery. CT perfusion head: Poor source data quality limits reliability of the perfusion values. Within this limitation, the perfusion software identifies no core infarct. The perfusion software identifies no critically hypoperfused parenchyma (utilizing the Tmax>6 seconds threshold). No mismatch volume reported. Electronically Signed   By: Bascom Lily D.O.   On: 12/19/2023 12:43   CT HEAD CODE STROKE WO CONTRAST Result Date: 12/19/2023 CLINICAL DATA:  Code stroke. Neuro deficit, acute, stroke suspected. EXAM: CT HEAD WITHOUT CONTRAST TECHNIQUE: Contiguous axial images were obtained from the base of the skull through the vertex without intravenous contrast. RADIATION DOSE REDUCTION: This exam was performed according to the departmental dose-optimization program which includes automated exposure control, adjustment of the mA and/or kV according to patient size and/or use of iterative reconstruction technique. COMPARISON:  CT angiogram head/neck 12/22/2022. Brain MRI 12/21/2022. FINDINGS: Brain: Generalized cerebral atrophy. Known chronic right anterior cerebral  artery vascular territory infarct within the medial right frontal lobe/corpus callosum. Unchanged chronic infarct again demonstrated within the right frontal lobe periventricular white matter and anterior right subinsular white matter. Unchanged small chronic cortical infarct within the anterior left frontal lobe. Background moderate patchy and ill-defined hypoattenuation within the cerebral white matter, nonspecific but compatible chronic small vessel ischemic disease. There is no acute intracranial hemorrhage. No acute demarcated cortical infarct. No extra-axial fluid collection. No evidence of an intracranial mass. No midline shift. No hyperdense vessel. Atherosclerotic calcifications. Vascular: No calvarial fracture or aggressive osseous lesion. Skull: No mass or acute finding within the imaged orbits. No significant paranasal sinus disease. Sinuses/Orbits: No mass or acute finding within the imaged orbits. Trace mucosal thickening within the bilateral ethmoid and right maxillary sinuses at the imaged levels. ASPECTS [facility] Stroke Program Early CT Score) - Ganglionic level infarction (caudate, lentiform nuclei, internal capsule, insula, M1-M3 cortex): 7 - Supraganglionic infarction (M4-M6 cortex): 3 Total score (0-10 with 10 being normal): 10 (when discounting chronic infarcts). No acute intracranial finding. These results were communicated to Dr. Bonnita Buttner at 12:12 pmon 5/18/2025by text page via the [facility] messaging system. IMPRESSION: 1.  No acute intracranial finding. 2. Parenchymal atrophy, chronic small vessel ischemic disease and chronic infarcts, as described. Electronically Signed   By: Bascom Lily D.O.   On: 12/19/2023 12:12    Procedures Procedures    Medications Ordered in ED Medications  sodium chloride  flush (NS) 0.9 % injection 3 mL (3 mLs Intravenous Given 12/19/23 1238)  iopamidol (ISOVUE-370) 76 % injection 100 mL (100 mLs Intravenous Contrast Given 12/19/23 1227)  acetaminophen  (TYLENOL )  tablet 1,000 mg (1,000 mg Oral Given 12/19/23 1313)  lactated ringers  bolus 1,000 mL (1,000 mLs Intravenous New Bag/Given 12/19/23 1432)  morphine  (PF) 4 MG/ML injection 4 mg (4 mg Intravenous Given 12/19/23 1443)  ondansetron  (ZOFRAN ) injection 4 mg (4 mg Intravenous Given 12/19/23 1444)    ED Course/ Medical Decision Making/ A&P                                 Medical Decision Making Amount and/or Complexity of Data Reviewed Labs: ordered. Decision-making details documented in ED Course. Radiology: ordered and independent interpretation performed. Decision-making details documented in ED Course. ECG/medicine tests: ordered and independent interpretation performed. Decision-making details documented in ED Course.  Risk OTC drugs. Prescription drug management.   Pt with multiple medical problems and comorbidities and presenting today with a complaint that caries a high risk for morbidity and  mortality.  Here today as a code stroke.  Dr. Bonnita Buttner and the stroke team are at bedside.  Patient went directly to scanner.  Symptoms are concerning for occlusion versus intracranial bleed versus possible infectious etiology.  I independently interpreted patient's labs and EKG.  EKG without acute findings, labs with a initial lactate of 2.4, negative COVID, Chem-8 without acute findings, CBC within normal limits, CMP reassuring, EtOH is negative. I have independently visualized and interpreted pt's images today.   CT of the head without evidence of bleed, CTA with some severe stenosis or blockage and neurology recommended evaluation for toxic metabolic causes given patient's low-grade fever as well as they we will do an MRI and EEG.  Patient's wife reports that he was having some complaints yesterday but his major issues were today.  He did have a temperature of 101 at home.  She reports no significant history of recurrent UTIs.  He has not had recent cough, complaint of abdominal pain nausea or vomiting.  No  one else is sick at home.  Urine is still pending at this point.  He was given IV fluids and Tylenol .  Feel that patient will require admission.  MRI with concern for small stroke.  Neurology recommends: MRI: small stroke in right periatrial WM Can admit for stroke w/u as below -Admit to hospitalist or observation -Telemetry monitoring -Allow for permissive hypertension for the first 24-48h - only treat PRN if SBP >220 mmHg. Blood pressures can be gradually normalized to SBP<140 upon discharge. -MRI brain without contrast -Echocardiogram -HgbA1c, fasting lipid panel -Frequent neuro checks -Prophylactic therapy- ASA+Plavix  -Atorvastatin  80 mg PO daily -Risk factor modification -PT consult, OT consult, Speech consult -Has a loop recorder - recommend interrogation.If Afib found on telemetry, will need anticoagulation. Decision pending imaging and stroke team rounding. -Management of fever per primary team.            Final Clinical Impression(s) / ED Diagnoses Final diagnoses:  Left-sided weakness  Fever of unknown origin    Rx / DC Orders ED Discharge Orders     None         Almond Army, MD 12/19/23 1514

## 2023-12-19 NOTE — Code Documentation (Signed)
 Stroke Response Nurse Documentation Code Documentation  Tyler Guerra. is a 73 y.o. male arriving to [facility]  via [facility] on 12/19/23 with past medical hx of hypertension, hyperlipidemia, CAD status post CABG, carotid artery stenosis, diabetes, prior CVA with L sided deficits. On aspirin  81 mg daily and clopidogrel  75 mg daily. Code stroke was activated by EMS.   Patient from home where he was LKW at 2300 last night and now complaining of L sided weakness and AMS. Per pt wife, he had a migraine and low grade fever yesterday. This morning when he woke up she noticed L sided weakness and AMS.   Stroke team at the bedside on patient arrival. Labs drawn and patient cleared for CT. Patient to CT with team. NIHSS 11, see documentation for details and code stroke times.  The following imaging was completed:  CT Head, CTA, and CTP. Patient is not a candidate for IV Thrombolytic due to outside window. Patient is not a candidate for IR due to no LVO.   Care Plan: q2h neuro checks and vitals.    Bedside handoff with ED RN Tyler Guerra.    Tyler Guerra  Stroke Response RN

## 2023-12-19 NOTE — H&P (Addendum)
 History and Physical    DOA: 12/19/2023  PCP: Donnie Galea, MD  Patient coming from: Home  Chief Complaint: Altered mental status  HPI: Tyler Guerra. is a 73 y.o. 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  Wife and daughter are at bedside and provides most of the history.  Wife states yesterday evening around 5 PM patient complained of headache and noted to have low-grade temp of 99.5.  He apparently had called wife who had gone out of the house and when no response tried calling his daughters.  When wife arrived home he seemed somewhat upset that he could not reach them but at the same time somewhat confused. Wife noted low-grade temp and gave him Motrin to help with the headache as well subsequent to which patient went to bed.   Patient apparently also had an episode of incontinence and wife suspected if patient had any UTI.  Patient's wife checked on him again this morning when he appeared to be warm to touch and his temp was 101F.  Wife tried to help patient out of bed but he was too weak to get up.  At this point wife called his daughters who live next-door to help patient out of the bed to chair.  Patient apparently was pulling on them and was having hard time following directions.  He appeared confused and family called EMS.  On EMS arrival patient noted to have left-sided neglect and some left-sided weakness.  One of the daughters apparently noted mild left facial asymmetry although now at baseline.  Patient also noted to be drooling from the left side of his mouth when being transported according to the family.  Patient in the ED appears to have left-sided neglect and right side preference.  He is able to speak clearly but has difficulty following  commands.  He is moving all his extremities spontaneously. ED course: Tmax 100.8 F, SBP 114-164, O2 sat 99% on room air, respiratory rate 15-20.  Labs show WBC 8.5, hemoglobin 13.1, hematocrit 38.7, platelet 159, sodium 137, potassium 3.6, chloride 103, bicarb 22, BUN 26, creatinine 1.12, calcium  9.4, INR 1.2, glucose 167, lactate 2.4.  COVID screen negative.  Chest x-ray with low lung volumes, no infiltrate.  UA unremarkable.  MRI head shows 3 mm right parietal infarct   Review of Systems: As per HPI, otherwise review of systems negative.    Past Medical History:  Diagnosis Date   Anxiety state 03/16/2016   Aortic stenosis    Mild to moderate by echo 12/2023 with mean aortic valve gradient 12 mmHg   Aphasia 01/26/2017   Benign localized prostatic hyperplasia with lower urinary tract symptoms (LUTS)    BPV (benign positional vertigo) 11/02/2014   Carotid artery stenosis    PER DUPLEX 10-22-2015  BILATERAL ICA 1-39%   Cataract    Coronary artery disease    CABG 2000 with LIMA> LAD, SVG > RCA and free radial > OM. 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  Gout    Gout 07/18/2010   Qualifier: Diagnosis of  By: Vallarie Gauze MD, Tyrone Gallop     H/O eye injury    chronic changes to left eye after injury   Hand weakness 03/02/2018   Hearing loss 04/21/2013   History of TIA (transient ischemic attack)    03/ 2017  no residual after brief episode loss peripheral vision   HOH (hard of hearing)    Hyperglycemia 05/17/2016   Hyperlipemia    Hyperlipidemia 07/18/2010   Qualifier: Diagnosis of  By: Vallarie Gauze MD, Graham     Hypertension    HYPERTENSION, BENIGN ESSENTIAL 07/18/2010   Qualifier: Diagnosis of   By: Vallarie Gauze MD, Graham     Hypokalemia 01/26/2017   Knee pain 03/13/2019   Left shoulder pain 09/04/2018   Pneumothorax 10/11/2019   RIGHT    Prostate cancer (HCC) UROLOGIST-  DR GRAPEY/  ONCOLOGIST-  DR MANNING   dx 2015--- Stage T1c, Gleason 3+4, PSA 4.03, vol 44cc   PVC's (premature ventricular contractions)    Rash and nonspecific skin eruption 10/14/2015   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   S/P CABG x 05 Dec 1998   Skin lesion 10/14/2015   Stroke [facility])    TIA (transient ischemic attack) 10/14/2015   Ventral hernia 10/26/2011   Wears glasses     Past Surgical History:  Procedure Laterality Date   APPENDECTOMY  1975   CARDIAC CATHETERIZATION  12-22-2001   dr Kay Parson   severe native vessel disease mLAD 60-70%,  total occulsion pCFX  and pRCA/  widely patent saphenous vein , free radial , and LIMA grafts/  minminal lv dysfunction, ef 60%   CHEST TUBE INSERTION Right 10/11/2019   CORONARY ARTERY BYPASS GRAFT  12/1998   LIMA to LAD,  SVG to PDA and Diagonal, Free radial graft to OM   CORONARY STENT INTERVENTION N/A 05/26/2021   Procedure: CORONARY STENT INTERVENTION;  Surgeon: Kyra Phy, MD;  Location: [facility];  Service: Cardiovascular;  Laterality: N/A;   Exericse treadmill test  last one 01-03-2014  dr Kay Parson   normal exercise tolerance w/ hypertensive repsonse,  no ischemic EKG changes, appropriate HR response & recovery ([facility] TM score 9;  Low Risk , PVC's w/ exertion)   EYE SURGERY     FRACTURE SURGERY     LEFT HEART CATH AND CORS/GRAFTS ANGIOGRAPHY N/A 05/26/2021   Procedure: LEFT HEART CATH AND CORS/GRAFTS ANGIOGRAPHY;  Surgeon: Kyra Phy, MD;  Location: [facility];  Service: Cardiovascular;  Laterality: N/A;   LOOP RECORDER INSERTION N/A 12/22/2022   Procedure: LOOP RECORDER INSERTION;  Surgeon: Tammie Fall, MD;  Location: [facility];  Service: Cardiovascular;  Laterality: N/A;   PROSTATE BIOPSY      RADIOACTIVE SEED IMPLANT N/A 01/13/2016   Procedure: RADIOACTIVE SEED IMPLANT/BRACHYTHERAPY IMPLANT;  Surgeon: Ann Barnacle, MD;  Location: [facility];  Service: Urology;  Laterality: N/A;    Social history:  reports that he has never smoked. He has never used smokeless tobacco. He reports that he does not drink alcohol and does not use drugs.   No Known Allergies  Family History  Problem Relation Age of Onset   Dementia Mother    Heart disease Mother    Cancer Father        prostate in eighties   Prostate cancer Father    Heart disease Father    Stroke Father  Cancer Sister        breast   Anxiety disorder Sister    Colon cancer Maternal Grandfather    Cancer Paternal Uncle        prostate      Prior to Admission medications   Medication Sig Start Date End Date Taking? Authorizing Provider  allopurinol  (ZYLOPRIM ) 300 MG tablet Take 1 tablet (300 mg total) by mouth daily. 11/30/23  Yes Donnie Galea, MD  aspirin  EC 81 MG tablet Take 1 tablet (81 mg total) by mouth daily. 06/01/19  Yes Arty Binning, MD  atorvastatin  (LIPITOR ) 80 MG tablet Take 1 tablet (80 mg total) by mouth daily. Patient taking differently: Take 80 mg by mouth at bedtime. 03/22/23  Yes Donnie Galea, MD  Cholecalciferol (VITAMIN D PO) Take 1 capsule by mouth daily.   Yes [provider]  clopidogrel  (PLAVIX ) 75 MG tablet Take 1 tablet (75 mg total) by mouth daily. 06/28/23  Yes Turner, Rufus Council, MD  escitalopram  (LEXAPRO ) 5 MG tablet Take 1 tablet (5 mg total) by mouth daily. 11/30/23  Yes Donnie Galea, MD  losartan  (COZAAR ) 25 MG tablet Take 0.5 tablets (12.5 mg total) by mouth daily. 08/02/23  Yes Donnie Galea, MD  metoprolol  tartrate (LOPRESSOR ) 25 MG tablet Take 0.5 tablets (12.5 mg total) by mouth 2 (two) times daily. 08/02/23  Yes Donnie Galea, MD  Misc Natural Products (BLACK CHERRY CONCENTRATE PO) Take 1 tablet by mouth at bedtime.   Yes [provider]  Multiple Vitamins-Minerals (OCUVITE PO) Take 1 capsule by mouth daily.   Yes [provider]  nitroGLYCERIN  (NITROSTAT ) 0.4 MG SL tablet Place 1 tablet (0.4 mg total) under the tongue every 5 (five) minutes as needed for chest pain. 05/27/21  Yes Goodrich, Callie E, PA-C  pantoprazole  (PROTONIX ) 40 MG tablet Take 1 tablet (40 mg total) by mouth daily. 11/30/23  Yes Donnie Galea, MD  polyethylene glycol powder (GLYCOLAX /MIRALAX ) 17 GM/SCOOP powder Take 17 g by mouth daily as needed for mild constipation. 12/22/22  Yes Sheikh, Omair Latif, DO  Propylene Glycol (SYSTANE BALANCE) 0.6 % SOLN Place 1 drop into both eyes daily as needed (dry eyes).   Yes [provider]  sodium chloride  (OCEAN) 0.65 % SOLN nasal spray Place 1 spray into both nostrils as needed for congestion.   Yes [provider]  tamsulosin  (FLOMAX ) 0.4 MG CAPS capsule Take 1 capsule (0.4 mg total) by mouth daily. 11/30/23  Yes Donnie Galea, MD  Vitamin A 3 MG CAPS Take by mouth.   Yes [provider]    Physical Exam: Vitals:   12/19/23 1518 12/19/23 1530 12/19/23 1558 12/19/23 1615  BP: (!) 117/51 (!) 128/50  (!) 114/58  Pulse: 66 62  63  Resp: 16 15  16   Temp:   100 F (37.8 C)   TempSrc:   Oral   SpO2: 97% 96%  97%  Weight:        Constitutional: NAD, calm, comfortable Eyes: PERRL, lids and conjunctivae normal ENMT: Mucous membranes are moist. Posterior pharynx clear of any exudate or lesions.Normal dentition.  Neck: normal, supple, no masses, no thyromegaly Respiratory: clear to auscultation bilaterally, no wheezing, no crackles. Normal respiratory effort. No accessory muscle use.  Cardiovascular: Regular rate and rhythm, no murmurs / rubs / gallops. No extremity edema. 2+ pedal pulses. No carotid bruits.  Abdomen: no tenderness, no masses palpated. No hepatosplenomegaly. Bowel sounds positive.  Musculoskeletal: no clubbing /  cyanosis. No joint deformity upper and lower  extremities. Good ROM, no contractures. Normal muscle tone.  Neurologic: No facial asymmetry, no ptosis.  CN 2-12 grossly intact.  Moving all extremities spontaneously.  Able to turn to the right side and left side by himself.  Could not test sensations as patient is not very responsive.  Poor grip on the left side.  Muscle strength 4/5 on the left side, could not test finger-to-nose test.  Could not check gait. Psychiatric: . Alert and oriented x 2. Normal mood.  SKIN/catheters: no rashes, lesions, ulcers. No induration  Labs on Admission: I have personally reviewed following labs and imaging studies  CBC: Recent Labs  Lab 12/19/23 1154 12/19/23 1200  WBC 8.5  --   NEUTROABS 6.9  --   HGB 13.1 13.3  HCT 38.7* 39.0  MCV 88.4  --   PLT 159  --    Basic Metabolic Panel: Recent Labs  Lab 12/19/23 1154 12/19/23 1200  NA 137 138  K 3.6 3.6  CL 103 105  CO2 22  --   GLUCOSE 167* 169*  BUN 26* 26*  CREATININE 1.12 1.00  CALCIUM  9.4  --    GFR: Estimated Creatinine Clearance: 78.6 mL/min (by C-G formula based on SCr of 1 mg/dL). Recent Labs  Lab 12/19/23 1154 12/19/23 1335  WBC 8.5  --   LATICACIDVEN  --  2.4*   Liver Function Tests: Recent Labs  Lab 12/19/23 1154  AST 25  ALT 16  ALKPHOS 97  BILITOT 1.6*  PROT 7.0  ALBUMIN 3.7   No results for input(s): "LIPASE", "AMYLASE" in the last 168 hours. No results for input(s): "AMMONIA" in the last 168 hours. Coagulation Profile: Recent Labs  Lab 12/19/23 1154  INR 1.2   Cardiac Enzymes: No results for input(s): "CKTOTAL", "CKMB", "CKMBINDEX", "TROPONINI" in the last 168 hours. BNP (last 3 results) No results for input(s): "PROBNP" in the last 8760 hours. HbA1C: No results for input(s): "HGBA1C" in the last 72 hours. CBG: Recent Labs  Lab 12/19/23 1154  GLUCAP 169*   Lipid Profile: No results for input(s): "CHOL", "HDL", "LDLCALC", "TRIG", "CHOLHDL", "LDLDIRECT" in the last 72 hours. Thyroid  Function  Tests: No results for input(s): "TSH", "T4TOTAL", "FREET4", "T3FREE", "THYROIDAB" in the last 72 hours. Anemia Panel: No results for input(s): "VITAMINB12", "FOLATE", "FERRITIN", "TIBC", "IRON", "RETICCTPCT" in the last 72 hours. Urine analysis:    Component Value Date/Time   COLORURINE YELLOW 12/19/2023 1439   APPEARANCEUR CLEAR 12/19/2023 1439   LABSPEC >1.046 (H) 12/19/2023 1439   PHURINE 6.0 12/19/2023 1439   GLUCOSEU NEGATIVE 12/19/2023 1439   HGBUR NEGATIVE 12/19/2023 1439   BILIRUBINUR NEGATIVE 12/19/2023 1439   BILIRUBINUR 1+ 12/01/2016 1018   KETONESUR 5 (A) 12/19/2023 1439   PROTEINUR NEGATIVE 12/19/2023 1439   UROBILINOGEN negative (A) 12/01/2016 1018   NITRITE NEGATIVE 12/19/2023 1439   LEUKOCYTESUR NEGATIVE 12/19/2023 1439    Radiological Exams on Admission: Personally reviewed  MR BRAIN WO CONTRAST Result Date: 12/19/2023 CLINICAL DATA:  Provided history: Stroke, follow-up. EXAM: MRI HEAD WITHOUT CONTRAST TECHNIQUE: Multiplanar, multiecho pulse sequences of the brain and surrounding structures were obtained without intravenous contrast. COMPARISON:  Non-contrast head CT and CT angiogram head/neck performed earlier today 12/19/2023. Brain MRI 12/21/2022. FINDINGS: Brain: Generalized cerebral atrophy. 3 mm acute infarct within the right periatrial white matter (series 9, image 53). Known chronic right anterior cerebral artery territory infarct within the medial right frontal lobe and corpus callosum. Adjacent chronic infarct within the  right frontal lobe periventricular white matter, extending inferiorly to the right basal ganglia. Small chronic cortical infarct within the anterior left frontal lobe. Chronic lacunar infarcts within the cerebral hemispheric white matter on the left. Additional small chronic lacunar infarct within the left subinsular white matter. Background advanced patchy and confluent T2 FLAIR hyperintense signal abnormality within the cerebral white matter,  nonspecific but compatible with chronic small vessel ischemic disease. Few punctate chronic microhemorrhages scattered within the supratentorial brain. No evidence of an intracranial mass. No extra-axial fluid collection. No midline shift. Vascular: Please see CTA head/neck performed earlier today. Skull and upper cervical spine: No focal worrisome marrow lesion. Slight C2-C3 grade 1 anterolisthesis. Sinuses/Orbits: No mass or acute finding within the imaged orbits. Prior left ocular lens replacement. No significant paranasal sinus disease. IMPRESSION: 1. 3 mm acute infarct within the right periatrial white matter. 2. Background parenchymal atrophy, chronic small vessel disease and redemonstrated chronic infarcts as described. Electronically Signed   By: Bascom Lily D.O.   On: 12/19/2023 14:46   DG Chest Port 1 View Result Date: 12/19/2023 CLINICAL DATA:  Fever.  Neurologic deficits. EXAM: PORTABLE CHEST 1 VIEW COMPARISON:  AP chest 05/22/2021 FINDINGS: Status post median sternotomy and CABG. Cardiac silhouette is at the upper limits of normal size. Mediastinal contours are within limits. Interval decrease in now moderately low lung volumes. No definite acute airspace opacity. No pleural effusion or pneumothorax. No acute skeletal abnormality. IMPRESSION: Interval decrease in now moderately low lung volumes. No definite acute airspace opacity. Electronically Signed   By: Bertina Broccoli M.D.   On: 12/19/2023 13:46   CT ANGIO HEAD NECK W WO CM W PERF (CODE STROKE) Result Date: 12/19/2023 CLINICAL DATA:  Provided history: Neuro deficit, acute, stroke suspected. EXAM: CT ANGIOGRAPHY HEAD AND NECK CT PERFUSION BRAIN TECHNIQUE: Multidetector CT imaging of the head and neck was performed using the standard protocol during bolus administration of intravenous contrast. Multiplanar CT image reconstructions and MIPs were obtained to evaluate the vascular anatomy. Carotid stenosis measurements (when applicable) are  obtained utilizing NASCET criteria, using the distal internal carotid diameter as the denominator. Multiphase CT imaging of the brain was performed following IV bolus contrast injection. Subsequent parametric perfusion maps were calculated using RAPID software. RADIATION DOSE REDUCTION: This exam was performed according to the departmental dose-optimization program which includes automated exposure control, adjustment of the mA and/or kV according to patient size and/or use of iterative reconstruction technique. CONTRAST:  Administered contrast not known at this time. COMPARISON:  Noncontrast head CT performed earlier today 12/19/2023. CT angiogram head/neck 12/22/2022. FINDINGS: CTA NECK FINDINGS Aortic arch: Standard aortic branching. Atherosclerotic plaque within the visualized thoracic aorta and proximal major branch vessels of the neck. Streak/beam hardening artifact arising from a dense contrast bolus partially obscures the left subclavian artery. Within this limitation, there is no appreciable hemodynamically significant innominate or proximal subclavian artery stenosis. Right carotid system: CCA and ICA patent within the neck without stenosis. Minimal atherosclerotic plaque at the carotid bifurcation. Left carotid system: CCA and ICA patent within the neck without stenosis. Minimal atherosclerotic plaque at the CCA origin. Mild atherosclerotic plaque about the carotid bifurcation and within the proximal ICA. Vertebral arteries: Patent within the neck without stenosis or significant atherosclerotic disease. The right vertebral artery is dominant. Skeleton: Nonspecific reversal of the expected cervical lordosis. Spondylosis at the cervical and visualized upper thoracic levels. Poor dentition. Other neck: No neck mass or cervical lymphadenopathy. Upper chest: No consolidation within the imaged lung apices.  Prior median sternotomy. Review of the MIP images confirms the above findings CTA HEAD FINDINGS Anterior  circulation: The intracranial internal carotid arteries are patent. Atherosclerotic plaque within both vessels. Most notably, calcified plaque results in up to moderate stenosis of the paraclinoid right ICA. The M1 middle cerebral arteries are patent. No M2 proximal branch occlusion or high-grade proximal stenosis. The right anterior cerebral artery A1 segment is markedly hypoplastic or developmentally absent. Focal occlusion versus severe near occlusive stenosis of the right anterior cerebral artery A3 segment (series 5, image 76) (series 10, image 45). It is difficult to determine if this was present on the prior CTA of 12/22/2022 (due to the degree of motion degradation on the prior exam). Immediately distal to this, additional severe stenosis within the right anterior cerebral artery A3 segment. The left anterior cerebral artery is patent. No intracranial aneurysm is identified. Posterior circulation: The intracranial vertebral arteries are patent. Mild atherosclerotic plaque within the right vertebral artery at the V3/V4 junction. Atherosclerotic plaque at the left vertebral artery V3/V4 junction with no more than mild stenosis. The basilar artery is patent. The posterior cerebral arteries are patent. Hypoplastic right P1 segment with sizable right posterior communicating artery. The left posterior communicating artery is diminutive Venous sinuses: Evaluation for dural venous sinus thrombosis is limited due to contrast timing. Anatomic variants: As described. Review of the MIP images confirms the above findings CT Brain Perfusion Findings: Poor source data quality limits reliability of the perfusion values. Within this limitation, findings are as follows. CBF (<30%) Volume: 0mL Perfusion (Tmax>6.0s) volume: 0mL Mismatch Volume: 0mL Infarction location:None identified. CTA head impression #1 called by telephone at the time of interpretation on 12/19/2023 at 12:43 pm to provider [facility] , who verbally  acknowledged these results. IMPRESSION: CTA neck: 1. The common carotid and internal carotid arteries are patent within the neck without stenosis. Mild atherosclerotic plaque bilaterally, as described. 2. The vertebral arteries are patent within the neck without stenosis or significant atherosclerotic disease. 3. Aortic Atherosclerosis (ICD10-I70.0). CTA head: 1. Focal occlusion versus severe, near-occlusive stenosis of the right anterior cerebral artery A3 segment. It is difficult to determine if this was present on the prior CTA of 12/22/2022 (due to the degree of motion degradation on the prior exam). Immediately downstream from this, additional severe stenosis within the right anterior cerebral artery A3 segment. 2. Background intracranial atherosclerotic disease. Most notably, calcified plaque results in up to moderate stenosis of the paraclinoid right internal carotid artery. CT perfusion head: Poor source data quality limits reliability of the perfusion values. Within this limitation, the perfusion software identifies no core infarct. The perfusion software identifies no critically hypoperfused parenchyma (utilizing the Tmax>6 seconds threshold). No mismatch volume reported. Electronically Signed   By: Bascom Lily D.O.   On: 12/19/2023 12:43   CT HEAD CODE STROKE WO CONTRAST Result Date: 12/19/2023 CLINICAL DATA:  Code stroke. Neuro deficit, acute, stroke suspected. EXAM: CT HEAD WITHOUT CONTRAST TECHNIQUE: Contiguous axial images were obtained from the base of the skull through the vertex without intravenous contrast. RADIATION DOSE REDUCTION: This exam was performed according to the departmental dose-optimization program which includes automated exposure control, adjustment of the mA and/or kV according to patient size and/or use of iterative reconstruction technique. COMPARISON:  CT angiogram head/neck 12/22/2022. Brain MRI 12/21/2022. FINDINGS: Brain: Generalized cerebral atrophy. Known chronic right  anterior cerebral artery vascular territory infarct within the medial right frontal lobe/corpus callosum. Unchanged chronic infarct again demonstrated within the right frontal lobe periventricular  white matter and anterior right subinsular white matter. Unchanged small chronic cortical infarct within the anterior left frontal lobe. Background moderate patchy and ill-defined hypoattenuation within the cerebral white matter, nonspecific but compatible chronic small vessel ischemic disease. There is no acute intracranial hemorrhage. No acute demarcated cortical infarct. No extra-axial fluid collection. No evidence of an intracranial mass. No midline shift. No hyperdense vessel. Atherosclerotic calcifications. Vascular: No calvarial fracture or aggressive osseous lesion. Skull: No mass or acute finding within the imaged orbits. No significant paranasal sinus disease. Sinuses/Orbits: No mass or acute finding within the imaged orbits. Trace mucosal thickening within the bilateral ethmoid and right maxillary sinuses at the imaged levels. ASPECTS [facility] Stroke Program Early CT Score) - Ganglionic level infarction (caudate, lentiform nuclei, internal capsule, insula, M1-M3 cortex): 7 - Supraganglionic infarction (M4-M6 cortex): 3 Total score (0-10 with 10 being normal): 10 (when discounting chronic infarcts). No acute intracranial finding. These results were communicated to Dr. Bonnita Buttner at 12:12 pmon 5/18/2025by text page via the [facility] messaging system. IMPRESSION: 1.  No acute intracranial finding. 2. Parenchymal atrophy, chronic small vessel ischemic disease and chronic infarcts, as described. Electronically Signed   By: Bascom Lily D.O.   On: 12/19/2023 12:12    EKG: Independently reviewed.  Normal sinus rhythm     Assessment and Plan:   Principal Problem:   Acute CVA (cerebrovascular accident) (HCC)    1.Acute right parietal CVA: Causing left-sided weakness, left hemineglect and right gaze preference.   Patient already on maximal therapy-aspirin  plus Plavix  at home along with high-dose statins.  Seen by neurology and recommended to continue the same.  Patient previously diagnosed with bilateral mild carotid artery stenosis (0 to 39% in 2017)-will repeat carotid Dopplers.  Patient has a loop recorder since last stroke-Per wife not been diagnosed with any arrhythmias so far.  PT/OT, speech therapy evaluations in AM.  N.p.o., aspirin , Plavix , statins, permissive hypertension and DVT prophylaxis for now.  2.   Fever: Unclear etiology.  No leukocytosis but has lactic acid elevation.  Started on empiric antibiotics for UTI after urine sample collected but UA now resulted normal.?  Aspiration in the setting of problem #1.  Continue empiric antibiotics for now.  Speech therapy evaluation in a.m.  3.  Hypertension: Hold home medications for permissive hypertension in the setting of problem #1  4.  Hyperlipidemia: Resume statins.  5.  CAD: Resume antiplatelet agents and statins.  Holding Coreg for permissive hypertension.  6.  Gout, BPV, anxiety, depression and other chronic medical conditions: Resume home meds.  DVT prophylaxis: Lovenox   COVID screen: Negative  Code Status: Full code as confirmed with family at bedside   .Health care proxy would be his wife Trevor Fudge  Patient/Family Communication: Discussed with patient and all questions answered to satisfaction.  Consults called: Neurology Admission status :I certify that at the point of admission it is my clinical judgment that the patient will require inpatient hospital care spanning beyond 2 midnights from the point of admission due to high intensity of service and high frequency of surveillance required.Inpatient status is judged to be reasonable and necessary in order to provide the required intensity of service to ensure the patient's safety. The patient's presenting symptoms, physical exam findings, and initial radiographic and laboratory data in  the context of their chronic comorbidities is felt to place them at high risk for further clinical deterioration. The following factors support the patient status of inpatient : Acute stroke with NIH greater than 2, continued AMS, left-sided  deficits.     Michela Aguas MD [facility] Pager in [place]  If 7PM-7AM, please contact night-coverage [email or web address]   12/19/2023, 5:24 PM

## 2023-12-19 NOTE — Progress Notes (Signed)
EEG complete. Results pending.  ?

## 2023-12-19 NOTE — ED Notes (Signed)
 Nt called CCMD to put pt on monitor

## 2023-12-20 DIAGNOSIS — E1151 Type 2 diabetes mellitus with diabetic peripheral angiopathy without gangrene: Secondary | ICD-10-CM

## 2023-12-20 DIAGNOSIS — I10 Essential (primary) hypertension: Secondary | ICD-10-CM | POA: Diagnosis not present

## 2023-12-20 DIAGNOSIS — R29704 NIHSS score 4: Secondary | ICD-10-CM

## 2023-12-20 DIAGNOSIS — E785 Hyperlipidemia, unspecified: Secondary | ICD-10-CM | POA: Diagnosis not present

## 2023-12-20 DIAGNOSIS — I6381 Other cerebral infarction due to occlusion or stenosis of small artery: Secondary | ICD-10-CM | POA: Diagnosis not present

## 2023-12-20 DIAGNOSIS — Z7982 Long term (current) use of aspirin: Secondary | ICD-10-CM

## 2023-12-20 DIAGNOSIS — Z7902 Long term (current) use of antithrombotics/antiplatelets: Secondary | ICD-10-CM

## 2023-12-20 DIAGNOSIS — I639 Cerebral infarction, unspecified: Secondary | ICD-10-CM | POA: Diagnosis not present

## 2023-12-20 LAB — LIPID PANEL
Cholesterol: 100 mg/dL (ref 0–200)
HDL: 34 mg/dL — ABNORMAL LOW (ref >40)
LDL Cholesterol: 51 mg/dL (ref 0–99)
Total CHOL/HDL Ratio: 2.9 ratio
Triglycerides: 76 mg/dL (ref <150)
VLDL: 15 mg/dL (ref 0–40)

## 2023-12-20 LAB — GLUCOSE, CAPILLARY: Glucose-Capillary: 85 mg/dL (ref 70–99)

## 2023-12-20 MED ORDER — AZITHROMYCIN 500 MG PO TABS
500.0000 mg | ORAL_TABLET | Freq: Every day | ORAL | Status: DC
  Administered 2023-12-20 – 2023-12-21 (×2): 500 mg via ORAL
  Filled 2023-12-20 (×2): qty 1

## 2023-12-20 NOTE — Evaluation (Signed)
 Occupational Therapy Evaluation Patient Details Name: Tyler Guerra. MRN: [id number] DOB: 06/10/1951 Today's Date: 12/20/2023   History of Present Illness   Tyler Cheema. is a 73 y.o. male admitted 12/19/23 with left-sided weakness and right gaze preference, migraine. MRI revealed acute infarct within the right periatrial white matter. PMH includes hypertension, hyperlipidemia, TIA, prostate cancer, right ACA stroke in 5/24, gout, CAD, TIA, anxiety, BPPV, CABG and stents, HOH (L better than R)     Clinical Impressions Pt is typically independent in ADL and mobility. Wife does report residual L sided weakness from previous CVA - but he was functional and independent, even driving short distances down the road to [place]. She manages his medicines and finances. Today Pt presents with decreased sensation and motor activation on LUE - impairment is worse distally as Pt did not respond to nail bed pressure on L hand, but could identify touch at the shoulder and elbow. Could not make fist, but can grossly move arm in shoulder and elbow flexion with overall MMT of 3/5. Pt with visual inattention and L visual deficits, not tracking past midline, requiring cues and head turns as well as decreased smoothness of tracking. Pt was min A +2 for transfers and short in room mobility with multimodal cues and 2 person HHA. Pt HOH (L ear better than R) and demonstrates delayed processing, attention. Overall he is mod A for UB and LB ADL and will benefit from skilled OT in the acute setting as well as afterwards at post-acute rehab of >3 hours daily to maximize safety and independence in ADL and functional transfers. Wife present and supportive throughout and can provide 24/7 care.      If plan is discharge home, recommend the following:   A little help with walking and/or transfers;A lot of help with bathing/dressing/bathroom;Assistance with cooking/housework;Direct supervision/assist for medications  management;Direct supervision/assist for financial management;Assist for transportation;Help with stairs or ramp for entrance;Supervision due to cognitive status     Functional Status Assessment   Patient has had a recent decline in their functional status and demonstrates the ability to make significant improvements in function in a reasonable and predictable amount of time.     Equipment Recommendations   BSC/3in1;Other (comment) (defer to next venue)     Recommendations for Other Services   Rehab consult;PT consult;Speech consult     Precautions/Restrictions   Precautions Precautions: Fall Recall of Precautions/Restrictions: Impaired Restrictions Weight Bearing Restrictions Per Provider Order: No     Mobility Bed Mobility Overal bed mobility: Needs Assistance Bed Mobility: Supine to Sit, Sit to Supine     Supine to sit: Mod assist Sit to supine: Mod assist   General bed mobility comments: multimodal cues for sequencing, assist for trunk elevation to come EOB and then assist for BLE back into bed for supine. assist for all aspects of line management    Transfers Overall transfer level: Needs assistance Equipment used: 2 person hand held assist Transfers: Sit to/from Stand Sit to Stand: Min assist, +2 physical assistance, +2 safety/equipment, From elevated surface (ED stretcher)           General transfer comment: min A for boost and balance, more assist on the left side      Balance Overall balance assessment: Needs assistance Sitting-balance support: Feet supported, Single extremity supported Sitting balance-Leahy Scale: Fair Sitting balance - Comments: unchallenged able to sit EOB with min guard close to supervision level   Standing balance support: Single extremity supported, Reliant on  assistive device for balance Standing balance-Leahy Scale: Poor Standing balance comment: dependent on external assist from therapy team                            ADL either performed or assessed with clinical judgement   ADL Overall ADL's : Needs assistance/impaired Eating/Feeding: NPO   Grooming: Wash/dry face;Moderate assistance;Sitting   Upper Body Bathing: Moderate assistance Upper Body Bathing Details (indicate cue type and reason): for back Lower Body Bathing: Moderate assistance;Sitting/lateral leans Lower Body Bathing Details (indicate cue type and reason): multimodal cues to attend to left side Upper Body Dressing : Moderate assistance Upper Body Dressing Details (indicate cue type and reason): new gown Lower Body Dressing: Maximal assistance   Toilet Transfer: Minimal assistance;+2 for safety/equipment;Ambulation Toilet Transfer Details (indicate cue type and reason): 2 person HHA, assist for initiation, safety with navigating environment Toileting- Clothing Manipulation and Hygiene: Moderate assistance;Sit to/from stand       Functional mobility during ADLs: Minimal assistance;+2 for safety/equipment;+2 for physical assistance (2 person HHA) General ADL Comments: L inattention, L visual deficits, decreaseb balance, decreased cog     Vision Baseline Vision/History: 1 Wears glasses Ability to See in Adequate Light: 2 Moderately impaired Patient Visual Report: Eye fatigue/eye pain/headache Vision Assessment?: Yes Eye Alignment: Within Functional Limits Ocular Range of Motion: Restricted on the left;Impaired-to be further tested in functional context Alignment/Gaze Preference: Gaze right Tracking/Visual Pursuits: Decreased smoothness of horizontal tracking;Requires cues, head turns, or add eye shifts to track;Impaired - to be further tested in functional context Saccades: Impaired - to be further tested in functional context Visual Fields: Left homonymous hemianopsia;Impaired-to be further tested in functional context Additional Comments: needs further assessment - limited assessment today due to floor transfer      Perception Perception: Impaired Preception Impairment Details: Inattention/Neglect, Spatial orientation Perception-Other Comments: L inattention   Praxis         Pertinent Vitals/Pain Pain Assessment Pain Assessment: Faces Pain Score: 0-No pain Pain Intervention(s): Monitored during session, Repositioned     Extremity/Trunk Assessment Upper Extremity Assessment Upper Extremity Assessment: Right hand dominant;LUE deficits/detail LUE Deficits / Details: decreased sensation, decreased overall function. MMT revealed 3/5 grasp - strength seems to decrease distally down the arm LUE Sensation: decreased light touch;decreased proprioception LUE Coordination: decreased fine motor;decreased gross motor (fine >gross deficits)   Lower Extremity Assessment Lower Extremity Assessment: Defer to PT evaluation   Cervical / Trunk Assessment Cervical / Trunk Assessment: Normal   Communication Communication Communication: Impaired Factors Affecting Communication: Hearing impaired (L ear better than R)   Cognition Arousal: Alert Behavior During Therapy: Flat affect Cognition: Cognition impaired     Awareness: Intellectual awareness impaired, Online awareness impaired Memory impairment (select all impairments): Working memory Attention impairment (select first level of impairment): Sustained attention Executive functioning impairment (select all impairments): Initiation, Sequencing, Reasoning, Problem solving OT - Cognition Comments: Pt with L inattention, delayed response/initiation, Pt pleasant and cooperative, decreased attention to task - will continue to assess                 Following commands: Impaired Following commands impaired: Follows one step commands with increased time (multimodal cues increased response time)     Cueing  General Comments   Cueing Techniques: Verbal cues;Gestural cues;Tactile cues  Wife "Trevor Fudge" present and supportive throughout session    Exercises     Shoulder Instructions      Home Living Family/patient expects to be discharged  to:: Private residence Living Arrangements: Spouse/significant other Available Help at Discharge: Family Type of Home: House Home Access: Level entry     Home Layout: Two level;Able to live on main level with bedroom/bathroom     Bathroom Shower/Tub: Chief Strategy Officer: Standard     Home Equipment: Shower seat;Cane - single point;Other (comment) (Duet Rollator that converts to transport chair)   Additional Comments: has been driving (stick shift) and wife checks on him via the 360 app      Prior Functioning/Environment Prior Level of Function : Needs assist             Mobility Comments: independent ADLs Comments: pt spouse manages bills, medications    OT Problem List: Decreased strength;Decreased range of motion;Decreased activity tolerance;Impaired balance (sitting and/or standing);Impaired vision/perception;Decreased coordination;Decreased cognition;Decreased safety awareness;Impaired sensation;Impaired UE functional use   OT Treatment/Interventions: Self-care/ADL training;Therapeutic exercise;Neuromuscular education;Energy conservation;DME and/or AE instruction;Manual therapy;Therapeutic activities;Cognitive remediation/compensation;Visual/perceptual remediation/compensation;Patient/family education;Balance training      OT Goals(Current goals can be found in the care plan section)   Acute Rehab OT Goals Patient Stated Goal: be independent OT Goal Formulation: With patient/family Time For Goal Achievement: 01/03/24 Potential to Achieve Goals: Good   OT Frequency:  Min 2X/week    Co-evaluation PT/OT/SLP Co-Evaluation/Treatment: Yes Reason for Co-Treatment: Necessary to address cognition/behavior during functional activity;For patient/therapist safety;To address functional/ADL transfers PT goals addressed during session: Mobility/safety with  mobility;Balance;Strengthening/ROM OT goals addressed during session: ADL's and self-care;Proper use of Adaptive equipment and DME;Strengthening/ROM (Vision)      AM-PAC OT "6 Clicks" Daily Activity     Outcome Measure Help from another person eating meals?: Total (NPO) Help from another person taking care of personal grooming?: A Lot Help from another person toileting, which includes using toliet, bedpan, or urinal?: A Lot Help from another person bathing (including washing, rinsing, drying)?: A Lot Help from another person to put on and taking off regular upper body clothing?: A Lot Help from another person to put on and taking off regular lower body clothing?: A Lot 6 Click Score: 11   End of Session Equipment Utilized During Treatment: Gait belt Nurse Communication: Mobility status;Precautions  Activity Tolerance: Patient tolerated treatment well Patient left: in bed;with call bell/phone within reach;with bed alarm set;with family/visitor present  OT Visit Diagnosis: Unsteadiness on feet (R26.81);Other abnormalities of gait and mobility (R26.89);Muscle weakness (generalized) (M62.81);Low vision, both eyes (H54.2);Other symptoms and signs involving the nervous system (R29.898);Other symptoms and signs involving cognitive function;Hemiplegia and hemiparesis Hemiplegia - Right/Left: Left Hemiplegia - dominant/non-dominant: Non-Dominant Hemiplegia - caused by: Cerebral infarction                Time: 1610-9604 OT Time Calculation (min): 24 min Charges:  OT General Charges $OT Visit: 1 Visit OT Evaluation $OT Eval Moderate Complexity: 1 Mod  Tyler Guerra OTR/L Acute Rehabilitation Services Office: [phone]   Ebony Goldstein [facility] 12/20/2023, 1:02 PM

## 2023-12-20 NOTE — Evaluation (Signed)
 Clinical/Bedside Swallow Evaluation Patient Details  Name: Tyler Guerra. MRN: [id number] Date of Birth: 03-04-1951  Today's Date: 12/20/2023 Time: SLP Start Time (ACUTE ONLY): 1300 SLP Stop Time (ACUTE ONLY): 1313 SLP Time Calculation (min) (ACUTE ONLY): 13 min  Past Medical History:  Past Medical History:  Diagnosis Date   Anxiety state 03/16/2016   Aortic stenosis    Mild to moderate by echo 12/2023 with mean aortic valve gradient 12 mmHg   Aphasia 01/26/2017   Benign localized prostatic hyperplasia with lower urinary tract symptoms (LUTS)    BPV (benign positional vertigo) 11/02/2014   Carotid artery stenosis    PER DUPLEX 10-22-2015  BILATERAL ICA 1-39%   Cataract    Coronary artery disease    CABG 2000 with LIMA> LAD, SVG > RCA and free radial > OM. 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   Gout    Gout 07/18/2010   Qualifier: Diagnosis of  By: Vallarie Gauze MD, Tyrone Gallop     H/O eye injury    chronic changes to left eye after injury   Hand weakness 03/02/2018   Hearing loss 04/21/2013   History of TIA (transient ischemic attack)    03/ 2017  no residual after brief episode loss peripheral vision   HOH (hard of hearing)    Hyperglycemia 05/17/2016   Hyperlipemia    Hyperlipidemia 07/18/2010   Qualifier: Diagnosis of  By: Vallarie Gauze MD, Graham     Hypertension    HYPERTENSION, BENIGN ESSENTIAL 07/18/2010   Qualifier: Diagnosis of  By: Vallarie Gauze MD, Graham     Hypokalemia 01/26/2017   Knee pain 03/13/2019   Left shoulder pain 09/04/2018   Pneumothorax 10/11/2019   RIGHT    Prostate cancer (HCC) UROLOGIST-  DR GRAPEY/  ONCOLOGIST-  DR MANNING   dx 2015--- Stage T1c, Gleason 3+4,  PSA 4.03, vol 44cc   PVC's (premature ventricular contractions)    Rash and nonspecific skin eruption 10/14/2015   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   S/P CABG x 05 Dec 1998   Skin lesion 10/14/2015   Stroke [facility])    TIA (transient ischemic attack) 10/14/2015   Ventral hernia 10/26/2011   Wears glasses    Past Surgical History:  Past Surgical History:  Procedure Laterality Date   APPENDECTOMY  1975   CARDIAC CATHETERIZATION  12-22-2001   dr Kay Parson   severe native vessel disease mLAD 60-70%,  total occulsion pCFX  and pRCA/  widely patent saphenous vein , free radial , and LIMA grafts/  minminal lv dysfunction, ef 60%   CHEST TUBE INSERTION Right 10/11/2019   CORONARY ARTERY BYPASS GRAFT  12/1998   LIMA to LAD,  SVG to PDA and Diagonal, Free radial graft to OM   CORONARY STENT INTERVENTION N/A 05/26/2021   Procedure: CORONARY STENT INTERVENTION;  Surgeon: Kyra Phy, MD;  Location: [facility] INVASIVE CV LAB;  Service: Cardiovascular;  Laterality: N/A;   Exericse treadmill test  last one 01-03-2014  dr Kay Parson   normal exercise tolerance w/ hypertensive repsonse,  no ischemic EKG changes, appropriate HR response & recovery ([facility] TM score 9;  Low Risk , PVC's w/ exertion)  EYE SURGERY     FRACTURE SURGERY     LEFT HEART CATH AND CORS/GRAFTS ANGIOGRAPHY N/A 05/26/2021   Procedure: LEFT HEART CATH AND CORS/GRAFTS ANGIOGRAPHY;  Surgeon: Thukkani, Arun K, MD;  Location: [facility] INVASIVE CV LAB;  Service: Cardiovascular;  Laterality: N/A;   LOOP RECORDER INSERTION N/A 12/22/2022   Procedure: LOOP RECORDER INSERTION;  Surgeon: Tammie Fall, MD;  Location: [facility] INVASIVE CV LAB;  Service: Cardiovascular;  Laterality: N/A;   PROSTATE BIOPSY     RADIOACTIVE SEED IMPLANT N/A 01/13/2016   Procedure: RADIOACTIVE SEED IMPLANT/BRACHYTHERAPY IMPLANT;  Surgeon: Ann Barnacle, MD;  Location[facility];  Service: Urology;  Laterality: N/A;   HPI:  Dewarren Ledbetter. is a 73 y.o. male admitted 12/19/23 with left-sided weakness and right gaze preference, migraine. MRI revealed acute infarct within the right periatrial white matter. PMH includes hypertension, hyperlipidemia, TIA, prostate cancer, right ACA stroke in 5/24, gout, CAD, TIA, anxiety, BPPV, CABG and stents, HOH (L better than R)    Assessment / Plan / Recommendation  Clinical Impression  Patient presents with what appears to be normal oropharyngeal swallowing function based on bedside exam. Noted chest xray with Interval decrease in now moderately low lung volumes. No definite  acute airspace opacity.  No indication from a bedside standpoint for instrumental testing however MD, if feel necessary in the future to r/o silent aspiration given fevers and/or cxr results, please reconsult for MBS. SLP Visit Diagnosis: Dysphagia, unspecified (R13.10)    Aspiration Risk  No limitations    Diet Recommendation Regular;Thin liquid    Liquid Administration via: Cup;Straw Medication Administration: Whole meds with liquid Supervision: Patient able to self feed Compensations: Slow rate;Small sips/bites Postural Changes: Seated upright at 90 degrees    Other  Recommendations Oral Care Recommendations: Oral care BID    Recommendations for follow up therapy are one component of a multi-disciplinary discharge planning process, led by the attending physician.  Recommendations may be updated based on patient status, additional functional criteria and insurance authorization.  Follow up Recommendations No SLP follow up         Functional Status Assessment Patient has not had a recent decline in their functional status    Swallow Study   General HPI: [name]. is a 73 y.o. male admitted 12/19/23 with left-sided weakness and right gaze preference, migraine. MRI revealed acute infarct within the right periatrial white matter. PMH includes hypertension, hyperlipidemia, TIA, prostate cancer, right ACA  stroke in 5/24, gout, CAD, TIA, anxiety, BPPV, CABG and stents, HOH (L better than R) Type of Study: Bedside Swallow Evaluation Previous Swallow Assessment: none Diet Prior to this Study: Regular;Thin liquids (Level 0) Temperature Spikes Noted: No Respiratory Status: Room air History of Recent Intubation: No Behavior/Cognition: Alert;Cooperative;Pleasant mood;Requires cueing Oral Cavity Assessment: Within Functional Limits Oral Care Completed by SLP: No Oral Cavity - Dentition: Adequate natural dentition Vision: Functional for self-feeding Self-Feeding Abilities: Able to feed self Patient Positioning: Upright in bed Baseline Vocal Quality: Normal Volitional Cough: Strong Volitional Swallow: Able to elicit    Oral/Motor/Sensory Function Overall Oral Motor/Sensory Function: Within functional limits   Ice Chips Ice chips: Not tested   Thin Liquid Thin Liquid: Within functional limits Presentation: Straw;Self Fed    Nectar Thick Nectar Thick Liquid: Not tested   Honey Thick Honey Thick Liquid: Not tested   Puree Puree: Within functional limits Presentation: Spoon   Solid     Solid: Within functional limits Presentation: Self Fed  Reakwon Barren MA, CCC-SLP  Ladasia Sircy Meryl 12/20/2023,1:26 PM

## 2023-12-20 NOTE — Progress Notes (Signed)
 [facility]                                                                               [name], is a 73 y.o. male, DOB - 1950/11/11, WUJ:[id number] Admit date - 12/19/2023    Outpatient Primary MD for the patient is Donnie Galea, MD  LOS - 1  days    Brief summary   Havard Guerra. is a 73 y.o. 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    Assessment & Plan    Assessment and Plan:   Acute right parietal CVA:  Continues to have left sided neglect and right gaze preference.  Neurology on board.  Stroke work up in progress.  Mri Brain shows 3 mm acute infarct within the right periatrial white matter.  CTA of the head and neck shows Focal occlusion versus severe, near-occlusive stenosis of the right anterior cerebral artery A3 segment. The common carotid and internal carotid arteries are patent within the neck without stenosis. The vertebral arteries are patent.  Recent 2D ECHO reviewed.  LDL is 51, A1c IS 6.7% Continue with aspirin  and plavix  for 4 weeks followed by aspirin  as per neurology.  Continue with lipitor  80 mg daily.  Therapy evaluations are pending.    Fever earlier this am.  UA is negative.  Patient empirically started on rocephin  and azithromycin  for possible aspiration event.  Check SLP evaluation.    CAD Stable, no chest pain.    Hypertension Well controlled.    Depression/ anxiety Resume home meds.    BPH Resume flomax .    Estimated body mass index is 30.18 kg/m as calculated from the following:   Height as of 11/30/23: 5\' 10"  (1.778 m).   Weight as of this encounter: 95.4 kg.  Code Status: full code.  DVT Prophylaxis:  enoxaparin  (LOVENOX ) injection 40 mg Start: 12/19/23  1700   Level of Care: Level of care: Telemetry Medical Family Communication: none at bedside.   Disposition Plan:     Remains inpatient appropriate:  pending further work up/ CIR work up.   Procedures:  MRI brain Echocardiogram.   Consultants:   Neurology.   Antimicrobials:   Anti-infectives (From admission, onward)    Start     Dose/Rate Route Frequency Ordered Stop   12/19/23 1730  cefTRIAXone  (ROCEPHIN ) 1 g in sodium chloride  0.9 % 100 mL IVPB        1 g 200 mL/hr over 30 Minutes Intravenous Every 24 hours 12/19/23 1724          Medications  Scheduled Meds:  allopurinol   300 mg Oral Daily   aspirin  EC  81 mg Oral Daily   atorvastatin   80 mg Oral Daily   cholecalciferol   1,000 Units Oral Daily   clopidogrel   75 mg Oral Daily   enoxaparin  (LOVENOX ) injection  40 mg Subcutaneous Q24H   escitalopram   5 mg Oral Daily   pantoprazole   40 mg Oral Daily  tamsulosin   0.4 mg Oral Daily   Continuous Infusions:  sodium chloride  40 mL/hr at 12/19/23 1726   cefTRIAXone  (ROCEPHIN )  IV Stopped (12/19/23 1837)   PRN Meds:.acetaminophen  **OR** acetaminophen  (TYLENOL ) oral liquid 160 mg/5 mL **OR** acetaminophen , nitroGLYCERIN , polyethylene glycol powder, polyvinyl alcohol     Subjective:   Tyler Guerra was seen and examined today.  No new complaints today.  Objective:   Vitals:   12/20/23 0613 12/20/23 0800 12/20/23 1127 12/20/23 1200  BP:  (!) 156/64 (!) 145/88 (!) 144/78  Pulse:  (!) 55 62 61  Resp:  18 13 12   Temp: 98.4 F (36.9 C) 98.2 F (36.8 C)    TempSrc: Oral Oral    SpO2:  99% 96% 93%  Weight:        Intake/Output Summary (Last 24 hours) at 12/20/2023 1457 Last data filed at 12/20/2023 1321 Gross per 24 hour  Intake --  Output 125 ml  Net -125 ml   Filed Weights   12/19/23 1100  Weight: 95.4 kg     Exam General exam: Appears calm and comfortable  Respiratory system: Clear to auscultation. Respiratory effort normal. Cardiovascular system: S1 &  S2 heard, RRR.  Gastrointestinal system: Abdomen is nondistended, soft and nontender.  Central nervous system: Alert, left sided neglect, very hard of hearing.  Extremities: Symmetric 5 x 5 power. Skin: No rashes, Psychiatry: unable to assess due to confusion.    Data Reviewed:  I have personally reviewed following labs and imaging studies   CBC Lab Results  Component Value Date   WBC 8.5 12/19/2023   RBC 4.38 12/19/2023   HGB 13.3 12/19/2023   HCT 39.0 12/19/2023   MCV 88.4 12/19/2023   MCH 29.9 12/19/2023   PLT 159 12/19/2023   MCHC 33.9 12/19/2023   RDW 12.8 12/19/2023   LYMPHSABS 1.2 12/19/2023   MONOABS 0.3 12/19/2023   EOSABS 0.0 12/19/2023   BASOSABS 0.0 12/19/2023     Last metabolic panel Lab Results  Component Value Date   NA 138 12/19/2023   K 3.6 12/19/2023   CL 105 12/19/2023   CO2 22 12/19/2023   BUN 26 (H) 12/19/2023   CREATININE 1.00 12/19/2023   GLUCOSE 169 (H) 12/19/2023   GFRNONAA >60 12/19/2023   GFRAA >60 10/12/2019   CALCIUM  9.4 12/19/2023   PHOS 3.6 12/22/2022   PROT 7.0 12/19/2023   ALBUMIN 3.7 12/19/2023   BILITOT 1.6 (H) 12/19/2023   ALKPHOS 97 12/19/2023   AST 25 12/19/2023   ALT 16 12/19/2023   ANIONGAP 12 12/19/2023    CBG (last 3)  Recent Labs    12/19/23 1154  GLUCAP 169*      Coagulation Profile: Recent Labs  Lab 12/19/23 1154  INR 1.2     Radiology Studies: EEG adult Result Date: 12/19/2023 Arleene Lack, MD     12/19/2023  6:35 PM Patient Name: Tyler Guerra. MRN: [id number] Epilepsy Attending: Arleene Lack Referring Physician/Provider: Colon Dear, NP Date: 12/19/2023 Duration: 21.27 mins Patient history: 73yo M with altered mental status, left-sided weakness, left-sided neglect and right gaze deviation. EEG to evaluate for seizure Level of alertness: Awake AEDs during EEG study: None Technical aspects: This EEG study was done with scalp electrodes positioned according to the 10-20  International system of electrode placement. Electrical activity was reviewed with band pass filter of 1-70Hz , sensitivity of 7 uV/mm, display speed of 28mm/sec with a 60Hz  notched filter applied as appropriate. EEG data were recorded  continuously and digitally stored.  Video monitoring was available and reviewed as appropriate. Description: EEG showed continuous generalized 3 to 6 Hz theta-delta slowing. Hyperventilation and photic stimulation were not performed.   ABNORMALITY - Continuous slow, generalized IMPRESSION: This study is suggestive of moderate diffuse encephalopathy. No seizures or epileptiform discharges were seen throughout the recording. Arleene Lack   MR BRAIN WO CONTRAST Result Date: 12/19/2023 CLINICAL DATA:  Provided history: Stroke, follow-up. EXAM: MRI HEAD WITHOUT CONTRAST TECHNIQUE: Multiplanar, multiecho pulse sequences of the brain and surrounding structures were obtained without intravenous contrast. COMPARISON:  Non-contrast head CT and CT angiogram head/neck performed earlier today 12/19/2023. Brain MRI 12/21/2022. FINDINGS: Brain: Generalized cerebral atrophy. 3 mm acute infarct within the right periatrial white matter (series 9, image 53). Known chronic right anterior cerebral artery territory infarct within the medial right frontal lobe and corpus callosum. Adjacent chronic infarct within the right frontal lobe periventricular white matter, extending inferiorly to the right basal ganglia. Small chronic cortical infarct within the anterior left frontal lobe. Chronic lacunar infarcts within the cerebral hemispheric white matter on the left. Additional small chronic lacunar infarct within the left subinsular white matter. Background advanced patchy and confluent T2 FLAIR hyperintense signal abnormality within the cerebral white matter, nonspecific but compatible with chronic small vessel ischemic disease. Few punctate chronic microhemorrhages scattered within the supratentorial  brain. No evidence of an intracranial mass. No extra-axial fluid collection. No midline shift. Vascular: Please see CTA head/neck performed earlier today. Skull and upper cervical spine: No focal worrisome marrow lesion. Slight C2-C3 grade 1 anterolisthesis. Sinuses/Orbits: No mass or acute finding within the imaged orbits. Prior left ocular lens replacement. No significant paranasal sinus disease. IMPRESSION: 1. 3 mm acute infarct within the right periatrial white matter. 2. Background parenchymal atrophy, chronic small vessel disease and redemonstrated chronic infarcts as described. Electronically Signed   By: Bascom Lily D.O.   On: 12/19/2023 14:46   DG Chest Port 1 View Result Date: 12/19/2023 CLINICAL DATA:  Fever.  Neurologic deficits. EXAM: PORTABLE CHEST 1 VIEW COMPARISON:  AP chest 05/22/2021 FINDINGS: Status post median sternotomy and CABG. Cardiac silhouette is at the upper limits of normal size. Mediastinal contours are within limits. Interval decrease in now moderately low lung volumes. No definite acute airspace opacity. No pleural effusion or pneumothorax. No acute skeletal abnormality. IMPRESSION: Interval decrease in now moderately low lung volumes. No definite acute airspace opacity. Electronically Signed   By: Bertina Broccoli M.D.   On: 12/19/2023 13:46   CT ANGIO HEAD NECK W WO CM W PERF (CODE STROKE) Result Date: 12/19/2023 CLINICAL DATA:  Provided history: Neuro deficit, acute, stroke suspected. EXAM: CT ANGIOGRAPHY HEAD AND NECK CT PERFUSION BRAIN TECHNIQUE: Multidetector CT imaging of the head and neck was performed using the standard protocol during bolus administration of intravenous contrast. Multiplanar CT image reconstructions and MIPs were obtained to evaluate the vascular anatomy. Carotid stenosis measurements (when applicable) are obtained utilizing NASCET criteria, using the distal internal carotid diameter as the denominator. Multiphase CT imaging of the brain was performed  following IV bolus contrast injection. Subsequent parametric perfusion maps were calculated using RAPID software. RADIATION DOSE REDUCTION: This exam was performed according to the departmental dose-optimization program which includes automated exposure control, adjustment of the mA and/or kV according to patient size and/or use of iterative reconstruction technique. CONTRAST:  Administered contrast not known at this time. COMPARISON:  Noncontrast head CT performed earlier today 12/19/2023. CT angiogram head/neck 12/22/2022. FINDINGS: CTA NECK FINDINGS  Aortic arch: Standard aortic branching. Atherosclerotic plaque within the visualized thoracic aorta and proximal major branch vessels of the neck. Streak/beam hardening artifact arising from a dense contrast bolus partially obscures the left subclavian artery. Within this limitation, there is no appreciable hemodynamically significant innominate or proximal subclavian artery stenosis. Right carotid system: CCA and ICA patent within the neck without stenosis. Minimal atherosclerotic plaque at the carotid bifurcation. Left carotid system: CCA and ICA patent within the neck without stenosis. Minimal atherosclerotic plaque at the CCA origin. Mild atherosclerotic plaque about the carotid bifurcation and within the proximal ICA. Vertebral arteries: Patent within the neck without stenosis or significant atherosclerotic disease. The right vertebral artery is dominant. Skeleton: Nonspecific reversal of the expected cervical lordosis. Spondylosis at the cervical and visualized upper thoracic levels. Poor dentition. Other neck: No neck mass or cervical lymphadenopathy. Upper chest: No consolidation within the imaged lung apices. Prior median sternotomy. Review of the MIP images confirms the above findings CTA HEAD FINDINGS Anterior circulation: The intracranial internal carotid arteries are patent. Atherosclerotic plaque within both vessels. Most notably, calcified plaque  results in up to moderate stenosis of the paraclinoid right ICA. The M1 middle cerebral arteries are patent. No M2 proximal branch occlusion or high-grade proximal stenosis. The right anterior cerebral artery A1 segment is markedly hypoplastic or developmentally absent. Focal occlusion versus severe near occlusive stenosis of the right anterior cerebral artery A3 segment (series 5, image 76) (series 10, image 45). It is difficult to determine if this was present on the prior CTA of 12/22/2022 (due to the degree of motion degradation on the prior exam). Immediately distal to this, additional severe stenosis within the right anterior cerebral artery A3 segment. The left anterior cerebral artery is patent. No intracranial aneurysm is identified. Posterior circulation: The intracranial vertebral arteries are patent. Mild atherosclerotic plaque within the right vertebral artery at the V3/V4 junction. Atherosclerotic plaque at the left vertebral artery V3/V4 junction with no more than mild stenosis. The basilar artery is patent. The posterior cerebral arteries are patent. Hypoplastic right P1 segment with sizable right posterior communicating artery. The left posterior communicating artery is diminutive Venous sinuses: Evaluation for dural venous sinus thrombosis is limited due to contrast timing. Anatomic variants: As described. Review of the MIP images confirms the above findings CT Brain Perfusion Findings: Poor source data quality limits reliability of the perfusion values. Within this limitation, findings are as follows. CBF (<30%) Volume: 0mL Perfusion (Tmax>6.0s) volume: 0mL Mismatch Volume: 0mL Infarction location:None identified. CTA head impression #1 called by telephone at the time of interpretation on 12/19/2023 at 12:43 pm to provider [facility], [place] , who verbally acknowledged these results. IMPRESSION: CTA neck: 1. The common carotid and internal carotid arteries are patent within the neck without stenosis.  Mild atherosclerotic plaque bilaterally, as described. 2. The vertebral arteries are patent within the neck without stenosis or significant atherosclerotic disease. 3. Aortic Atherosclerosis (ICD10-I70.0). CTA head: 1. Focal occlusion versus severe, near-occlusive stenosis of the right anterior cerebral artery A3 segment. It is difficult to determine if this was present on the prior CTA of 12/22/2022 (due to the degree of motion degradation on the prior exam). Immediately downstream from this, additional severe stenosis within the right anterior cerebral artery A3 segment. 2. Background intracranial atherosclerotic disease. Most notably, calcified plaque results in up to moderate stenosis of the paraclinoid right internal carotid artery. CT perfusion head: Poor source data quality limits reliability of the perfusion values. Within this limitation, the perfusion software identifies  no core infarct. The perfusion software identifies no critically hypoperfused parenchyma (utilizing the Tmax>6 seconds threshold). No mismatch volume reported. Electronically Signed   By: Bascom Lily D.O.   On: 12/19/2023 12:43   CT HEAD CODE STROKE WO CONTRAST Result Date: 12/19/2023 CLINICAL DATA:  Code stroke. Neuro deficit, acute, stroke suspected. EXAM: CT HEAD WITHOUT CONTRAST TECHNIQUE: Contiguous axial images were obtained from the base of the skull through the vertex without intravenous contrast. RADIATION DOSE REDUCTION: This exam was performed according to the departmental dose-optimization program which includes automated exposure control, adjustment of the mA and/or kV according to patient size and/or use of iterative reconstruction technique. COMPARISON:  CT angiogram head/neck 12/22/2022. Brain MRI 12/21/2022. FINDINGS: Brain: Generalized cerebral atrophy. Known chronic right anterior cerebral artery vascular territory infarct within the medial right frontal lobe/corpus callosum. Unchanged chronic infarct again  demonstrated within the right frontal lobe periventricular white matter and anterior right subinsular white matter. Unchanged small chronic cortical infarct within the anterior left frontal lobe. Background moderate patchy and ill-defined hypoattenuation within the cerebral white matter, nonspecific but compatible chronic small vessel ischemic disease. There is no acute intracranial hemorrhage. No acute demarcated cortical infarct. No extra-axial fluid collection. No evidence of an intracranial mass. No midline shift. No hyperdense vessel. Atherosclerotic calcifications. Vascular: No calvarial fracture or aggressive osseous lesion. Skull: No mass or acute finding within the imaged orbits. No significant paranasal sinus disease. Sinuses/Orbits: No mass or acute finding within the imaged orbits. Trace mucosal thickening within the bilateral ethmoid and right maxillary sinuses at the imaged levels. ASPECTS [facility] Stroke Program Early CT Score) - Ganglionic level infarction (caudate, lentiform nuclei, internal capsule, insula, M1-M3 cortex): 7 - Supraganglionic infarction (M4-M6 cortex): 3 Total score (0-10 with 10 being normal): 10 (when discounting chronic infarcts). No acute intracranial finding. These results were communicated to Dr. Bonnita Buttner at 12:12 pmon 5/18/2025by text page via the [facility] messaging system. IMPRESSION: 1.  No acute intracranial finding. 2. Parenchymal atrophy, chronic small vessel ischemic disease and chronic infarcts, as described. Electronically Signed   By: Bascom Lily D.O.   On: 12/19/2023 12:12       Feliciana Horn M.D. [facility] 12/20/2023, 2:57 PM  Available via Epic secure chat 7am-7pm After 7 pm, please refer to night coverage provider listed on amion.

## 2023-12-20 NOTE — Progress Notes (Addendum)
 STROKE TEAM PROGRESS NOTE    INTERIM HISTORY/SUBJECTIVE  Patient presented 5/18 complaining of left-sided weakness, left-sided neglect, altered mental status, right gaze deviation.  Febrile with elevated lactic acid, but no leukocytosis on admission. MRI shows tiny right parietal periventricular white matter infarct.  Patient has history of chronic infarcts with most recent stroke being in May 2024.  He is on aspirin  and Plavix  at home prior to admission.  EEG negative.  Wife at bedside during rounds.  Patient complaining of continued headache.  Continues to have confusion, left-sided weakness, left-sided facial droop, left hemianopia.  OBJECTIVE  CBC    Component Value Date/Time   WBC 8.5 12/19/2023 1154   RBC 4.38 12/19/2023 1154   HGB 13.3 12/19/2023 1200   HCT 39.0 12/19/2023 1200   PLT 159 12/19/2023 1154   MCV 88.4 12/19/2023 1154   MCH 29.9 12/19/2023 1154   MCHC 33.9 12/19/2023 1154   RDW 12.8 12/19/2023 1154   LYMPHSABS 1.2 12/19/2023 1154   MONOABS 0.3 12/19/2023 1154   EOSABS 0.0 12/19/2023 1154   BASOSABS 0.0 12/19/2023 1154    BMET    Component Value Date/Time   NA 138 12/19/2023 1200   NA 142 10/22/2021 1047   K 3.6 12/19/2023 1200   CL 105 12/19/2023 1200   CO2 22 12/19/2023 1154   GLUCOSE 169 (H) 12/19/2023 1200   GLUCOSE 91 08/06/2010 1536   BUN 26 (H) 12/19/2023 1200   BUN 20 10/22/2021 1047   CREATININE 1.00 12/19/2023 1200   CALCIUM  9.4 12/19/2023 1154   EGFR 72 10/22/2021 1047   GFRNONAA >60 12/19/2023 1154    IMAGING past 24 hours EEG adult Result Date: 12/19/2023 Arleene Lack, MD     12/19/2023  6:35 PM Patient Name: Tyler Guerra. MRN: [id number] Epilepsy Attending: Arleene Lack Referring Physician/Provider: Colon Dear, NP Date: 12/19/2023 Duration: 21.27 mins Patient history: 73yo M with altered mental status, left-sided weakness, left-sided neglect and right gaze deviation. EEG to evaluate for seizure Level of  alertness: Awake AEDs during EEG study: None Technical aspects: This EEG study was done with scalp electrodes positioned according to the 10-20 International system of electrode placement. Electrical activity was reviewed with band pass filter of 1-70Hz , sensitivity of 7 uV/mm, display speed of 58mm/sec with a 60Hz  notched filter applied as appropriate. EEG data were recorded continuously and digitally stored.  Video monitoring was available and reviewed as appropriate. Description: EEG showed continuous generalized 3 to 6 Hz theta-delta slowing. Hyperventilation and photic stimulation were not performed.   ABNORMALITY - Continuous slow, generalized IMPRESSION: This study is suggestive of moderate diffuse encephalopathy. No seizures or epileptiform discharges were seen throughout the recording. Arleene Lack   MR BRAIN WO CONTRAST Result Date: 12/19/2023 CLINICAL DATA:  Provided history: Stroke, follow-up. EXAM: MRI HEAD WITHOUT CONTRAST TECHNIQUE: Multiplanar, multiecho pulse sequences of the brain and surrounding structures were obtained without intravenous contrast. COMPARISON:  Non-contrast head CT and CT angiogram head/neck performed earlier today 12/19/2023. Brain MRI 12/21/2022. FINDINGS: Brain: Generalized cerebral atrophy. 3 mm acute infarct within the right periatrial white matter (series 9, image 53). Known chronic right anterior cerebral artery territory infarct within the medial right frontal lobe and corpus callosum. Adjacent chronic infarct within the right frontal lobe periventricular white matter, extending inferiorly to the right basal ganglia. Small chronic cortical infarct within the anterior left frontal lobe. Chronic lacunar infarcts within the cerebral hemispheric white matter on the left. Additional small chronic  lacunar infarct within the left subinsular white matter. Background advanced patchy and confluent T2 FLAIR hyperintense signal abnormality within the cerebral white matter,  nonspecific but compatible with chronic small vessel ischemic disease. Few punctate chronic microhemorrhages scattered within the supratentorial brain. No evidence of an intracranial mass. No extra-axial fluid collection. No midline shift. Vascular: Please see CTA head/neck performed earlier today. Skull and upper cervical spine: No focal worrisome marrow lesion. Slight C2-C3 grade 1 anterolisthesis. Sinuses/Orbits: No mass or acute finding within the imaged orbits. Prior left ocular lens replacement. No significant paranasal sinus disease. IMPRESSION: 1. 3 mm acute infarct within the right periatrial white matter. 2. Background parenchymal atrophy, chronic small vessel disease and redemonstrated chronic infarcts as described. Electronically Signed   By: Bascom Lily D.O.   On: 12/19/2023 14:46   DG Chest Port 1 View Result Date: 12/19/2023 CLINICAL DATA:  Fever.  Neurologic deficits. EXAM: PORTABLE CHEST 1 VIEW COMPARISON:  AP chest 05/22/2021 FINDINGS: Status post median sternotomy and CABG. Cardiac silhouette is at the upper limits of normal size. Mediastinal contours are within limits. Interval decrease in now moderately low lung volumes. No definite acute airspace opacity. No pleural effusion or pneumothorax. No acute skeletal abnormality. IMPRESSION: Interval decrease in now moderately low lung volumes. No definite acute airspace opacity. Electronically Signed   By: Bertina Broccoli M.D.   On: 12/19/2023 13:46   CT ANGIO HEAD NECK W WO CM W PERF (CODE STROKE) Result Date: 12/19/2023 CLINICAL DATA:  Provided history: Neuro deficit, acute, stroke suspected. EXAM: CT ANGIOGRAPHY HEAD AND NECK CT PERFUSION BRAIN TECHNIQUE: Multidetector CT imaging of the head and neck was performed using the standard protocol during bolus administration of intravenous contrast. Multiplanar CT image reconstructions and MIPs were obtained to evaluate the vascular anatomy. Carotid stenosis measurements (when applicable) are  obtained utilizing NASCET criteria, using the distal internal carotid diameter as the denominator. Multiphase CT imaging of the brain was performed following IV bolus contrast injection. Subsequent parametric perfusion maps were calculated using RAPID software. RADIATION DOSE REDUCTION: This exam was performed according to the departmental dose-optimization program which includes automated exposure control, adjustment of the mA and/or kV according to patient size and/or use of iterative reconstruction technique. CONTRAST:  Administered contrast not known at this time. COMPARISON:  Noncontrast head CT performed earlier today 12/19/2023. CT angiogram head/neck 12/22/2022. FINDINGS: CTA NECK FINDINGS Aortic arch: Standard aortic branching. Atherosclerotic plaque within the visualized thoracic aorta and proximal major branch vessels of the neck. Streak/beam hardening artifact arising from a dense contrast bolus partially obscures the left subclavian artery. Within this limitation, there is no appreciable hemodynamically significant innominate or proximal subclavian artery stenosis. Right carotid system: CCA and ICA patent within the neck without stenosis. Minimal atherosclerotic plaque at the carotid bifurcation. Left carotid system: CCA and ICA patent within the neck without stenosis. Minimal atherosclerotic plaque at the CCA origin. Mild atherosclerotic plaque about the carotid bifurcation and within the proximal ICA. Vertebral arteries: Patent within the neck without stenosis or significant atherosclerotic disease. The right vertebral artery is dominant. Skeleton: Nonspecific reversal of the expected cervical lordosis. Spondylosis at the cervical and visualized upper thoracic levels. Poor dentition. Other neck: No neck mass or cervical lymphadenopathy. Upper chest: No consolidation within the imaged lung apices. Prior median sternotomy. Review of the MIP images confirms the above findings CTA HEAD FINDINGS Anterior  circulation: The intracranial internal carotid arteries are patent. Atherosclerotic plaque within both vessels. Most notably, calcified plaque results in up to  moderate stenosis of the paraclinoid right ICA. The M1 middle cerebral arteries are patent. No M2 proximal branch occlusion or high-grade proximal stenosis. The right anterior cerebral artery A1 segment is markedly hypoplastic or developmentally absent. Focal occlusion versus severe near occlusive stenosis of the right anterior cerebral artery A3 segment (series 5, image 76) (series 10, image 45). It is difficult to determine if this was present on the prior CTA of 12/22/2022 (due to the degree of motion degradation on the prior exam). Immediately distal to this, additional severe stenosis within the right anterior cerebral artery A3 segment. The left anterior cerebral artery is patent. No intracranial aneurysm is identified. Posterior circulation: The intracranial vertebral arteries are patent. Mild atherosclerotic plaque within the right vertebral artery at the V3/V4 junction. Atherosclerotic plaque at the left vertebral artery V3/V4 junction with no more than mild stenosis. The basilar artery is patent. The posterior cerebral arteries are patent. Hypoplastic right P1 segment with sizable right posterior communicating artery. The left posterior communicating artery is diminutive Venous sinuses: Evaluation for dural venous sinus thrombosis is limited due to contrast timing. Anatomic variants: As described. Review of the MIP images confirms the above findings CT Brain Perfusion Findings: Poor source data quality limits reliability of the perfusion values. Within this limitation, findings are as follows. CBF (<30%) Volume: 0mL Perfusion (Tmax>6.0s) volume: 0mL Mismatch Volume: 0mL Infarction location:None identified. CTA head impression #1 called by telephone at the time of interpretation on 12/19/2023 at 12:43 pm to provider [facility] , who verbally  acknowledged these results. IMPRESSION: CTA neck: 1. The common carotid and internal carotid arteries are patent within the neck without stenosis. Mild atherosclerotic plaque bilaterally, as described. 2. The vertebral arteries are patent within the neck without stenosis or significant atherosclerotic disease. 3. Aortic Atherosclerosis (ICD10-I70.0). CTA head: 1. Focal occlusion versus severe, near-occlusive stenosis of the right anterior cerebral artery A3 segment. It is difficult to determine if this was present on the prior CTA of 12/22/2022 (due to the degree of motion degradation on the prior exam). Immediately downstream from this, additional severe stenosis within the right anterior cerebral artery A3 segment. 2. Background intracranial atherosclerotic disease. Most notably, calcified plaque results in up to moderate stenosis of the paraclinoid right internal carotid artery. CT perfusion head: Poor source data quality limits reliability of the perfusion values. Within this limitation, the perfusion software identifies no core infarct. The perfusion software identifies no critically hypoperfused parenchyma (utilizing the Tmax>6 seconds threshold). No mismatch volume reported. Electronically Signed   By: Bascom Lily D.O.   On: 12/19/2023 12:43   CT HEAD CODE STROKE WO CONTRAST Result Date: 12/19/2023 CLINICAL DATA:  Code stroke. Neuro deficit, acute, stroke suspected. EXAM: CT HEAD WITHOUT CONTRAST TECHNIQUE: Contiguous axial images were obtained from the base of the skull through the vertex without intravenous contrast. RADIATION DOSE REDUCTION: This exam was performed according to the departmental dose-optimization program which includes automated exposure control, adjustment of the mA and/or kV according to patient size and/or use of iterative reconstruction technique. COMPARISON:  CT angiogram head/neck 12/22/2022. Brain MRI 12/21/2022. FINDINGS: Brain: Generalized cerebral atrophy. Known chronic right  anterior cerebral artery vascular territory infarct within the medial right frontal lobe/corpus callosum. Unchanged chronic infarct again demonstrated within the right frontal lobe periventricular white matter and anterior right subinsular white matter. Unchanged small chronic cortical infarct within the anterior left frontal lobe. Background moderate patchy and ill-defined hypoattenuation within the cerebral white matter, nonspecific but compatible chronic small vessel ischemic disease.  There is no acute intracranial hemorrhage. No acute demarcated cortical infarct. No extra-axial fluid collection. No evidence of an intracranial mass. No midline shift. No hyperdense vessel. Atherosclerotic calcifications. Vascular: No calvarial fracture or aggressive osseous lesion. Skull: No mass or acute finding within the imaged orbits. No significant paranasal sinus disease. Sinuses/Orbits: No mass or acute finding within the imaged orbits. Trace mucosal thickening within the bilateral ethmoid and right maxillary sinuses at the imaged levels. ASPECTS [facility] Stroke Program Early CT Score) - Ganglionic level infarction (caudate, lentiform nuclei, internal capsule, insula, M1-M3 cortex): 7 - Supraganglionic infarction (M4-M6 cortex): 3 Total score (0-10 with 10 being normal): 10 (when discounting chronic infarcts). No acute intracranial finding. These results were communicated to Dr. Bonnita Buttner at 12:12 pmon 5/18/2025by text page via the [facility] messaging system. IMPRESSION: 1.  No acute intracranial finding. 2. Parenchymal atrophy, chronic small vessel ischemic disease and chronic infarcts, as described. Electronically Signed   By: Bascom Lily D.O.   On: 12/19/2023 12:12    Vitals:   12/20/23 0600 12/20/23 5188 12/20/23 0800 12/20/23 1127  BP: (!) 153/75  (!) 156/64 (!) 145/88  Pulse: (!) 54  (!) 55 62  Resp:   18 13  Temp:  98.4 F (36.9 C) 98.2 F (36.8 C)   TempSrc:  Oral Oral   SpO2: 97%  99% 96%  Weight:          PHYSICAL EXAM General: Sleepy, appears acutely ill, NAD Psych:  Mood and affect appropriate for situation CV: Regular rate and rhythm on monitor Respiratory:  Regular, unlabored respirations on room air  NEURO:  Mental Status: Drowsy, Oriented to self, age, time. Slow to answer. Poor attention.  Speech/Language: No dysarthria or aphasia. Able to follow all simple commands.   Cranial Nerves:  II: PERRL. Left hemianopsia  III, IV, VI: EOMI with decreased tracking. Eyelids elevate symmetrically.  V: Sensation is subjectively intact to light touch and symmetrical to face.  VII: Left facial droop VIII: hearing intact to voice. IX, X: No dysarthria XII: tongue is midline without fasciculations. Motor: Spontaneous movement of all 4 extremities seen with antigravity strength.  There is mild drift present on the left side Tone: is normal and bulk is normal Sensation- Decreased to left arm   Coordination: FTN intact bilaterally, HKS: no ataxia in BLE.No drift.  Gait- deferred  Most Recent NIH: 4.     ASSESSMENT/PLAN  Tyler Guerra. is a 73 y.o. 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  Acute Ischemic Infarct:  right parietal periventricular white matter  Etiology:  small vessel disease,   Code Stroke CT head  No acute finding Parenchymal atrophy, chronic small vessel disease, chronic infarcts (anterior left frontal lobe, right frontal lobe, right white matter) CTA head & neck  Focal acute occlusion versus severe, near occlusive stenosis right A3 segment.  Additional severe stenosis within right A3 segment immediately downstream from this. Background intracranial atherosclerotic disease with calcified plaque resulting in up to moderate stenosis of  paraclinoid right ICA CT perfusion  No core infarct, no mismatch volume reported MRI   3 mm acute right white matter infarct Background parenchymal atrophy, chronic small vessel disease, chronic infarcts rEEG: Suggestive of moderate diffuse encephalopathy.  No seizures seen. Carotid Doppler : PENDING 2D Echo 12/09/2023:  EF 60 to 65%, mild LVH, trivial MVR, trivial AVR, mild to moderate AV stenosis Recommend Loop recorder interrogation to evaluate for atrial fibrillation as this will change anticoagulation recommendations LDL 51 HgbA1c 6.7 VTE prophylaxis -Lovenox  aspirin  81 mg daily and clopidogrel  75 mg daily prior to admission, continue Aspirin  and Brilinta  for 4 weeks, then Aspirin  only as DAPT has failed.  Therapy recommendations:  Pending Disposition:  pending  Hx of Stroke/TIA TIA 2017 12/2022: Right ACA territory infarct due to right A1 occlusion with exam notable for mild LLE weakness and ataxia.  Discharged with walker and outpatient therapy orders, aspirin  and Brilinta  for 4 weeks, then resumed his aspirin  and Plavix .   Hypertension Home meds: Losartan  25 mg daily Stable Blood Pressure Goal: BP less than 220/110 for 24 to 48 hours after symptom onset, then gradually normalize Avoid hypotension due to intracranial atherosclerosis  Hyperlipidemia Home meds: Atorvastatin  80 mg, resumed in hospital LDL 51, goal < 70 Continue statin at discharge  Diabetes type II Controlled Home meds:  none HgbA1c 6.7, goal < 7.0 CBGs SSI Recommend close follow-up with PCP for better DM control  Other Stroke Risk Factors Obesity, Body mass index is 30.18 kg/m., BMI >/= 30 associated with increased stroke risk, recommend weight loss, diet and exercise as appropriate  Family hx stroke (father) Coronary artery disease S/p CABG   Other Active Problems Hx of Prostate Cancer  Hospital day # 1    Pt seen by Neuro NP/APP and later by MD. Note/plan to be edited by MD as needed.    Audrene Lease, DNP [facility]  I have personally obtained history,examined this patient, reviewed notes, independently viewed imaging studies, participated in medical decision making and plan of care.ROS completed by me personally and pertinent positives fully documented  I have made any additions or clarifications directly to the above note. Agree with note above.  Patient presented with altered mental status with left-sided weakness and right gaze deviation in the setting of febrile illness.  MRI shows only a small right parietal periventricular white matter lacunar infarct but patient has had multiple previous strokes.  Aspirin  and Brillinta for 4 weeks followed by aspirin  alone as patient has failed dual antiplatelet therapy with aspirin  and Plavix .  Continue ongoing stroke workup.  Aggressive risk factor modification.  Interrogate loop recorder for paroxysmal A-fib.  Mobilize out of bed.  Therapy consults.  Treatment of fever and possible infection as per primary team.  Greater than 50% time during this 50-minute visit was spent in counseling and coordination of care and discussion with patient and answering questions.  Ardella Beaver, MD Medical Director [facility] Pager: [phone] 12/20/2023 5:35 PM  Please use AMION for contact information & EPIC for messaging.  To contact Stroke Continuity provider, please refer to [email or web address]. After hours, contact General Neurology

## 2023-12-20 NOTE — Progress Notes (Signed)

## 2023-12-20 NOTE — Evaluation (Addendum)
 Physical Therapy Evaluation Patient Details Name: Tyler Guerra. MRN: [id number] DOB: November 20, 1950 Today's Date: 12/20/2023  History of Present Illness  Tyler Guerra. is a 73 y.o. male admitted 12/19/23 with left-sided weakness and right gaze preference, migraine. MRI revealed acute infarct within the right periatrial white matter. 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 Pt presents with decreased functional mobility secondary to left inattention, impaired sensation, balance, and cognition. Pt requiring min assist (+2 safety) for transfers and room ambulation with handheld guidance and max multimodal cueing for environmental navigation. Patient will benefit from intensive inpatient follow-up therapy, >3 hours/day in order to address deficits, maximize functional mobility and decrease caregiver burden.       If plan is discharge home, recommend the following: A little help with walking and/or transfers;A little help with bathing/dressing/bathroom;Assistance with cooking/housework;Direct supervision/assist for medications management;Direct supervision/assist for financial management;Assist for transportation   Can travel by private vehicle        Equipment Recommendations None recommended by PT  Recommendations for Other Services  Rehab consult    Functional Status Assessment Patient has had a recent decline in their functional status and demonstrates the ability to make significant improvements in function in a reasonable and predictable amount of time.     Precautions / Restrictions Precautions Precautions: Fall Recall of Precautions/Restrictions: Impaired Restrictions Weight Bearing  Restrictions Per Provider Order: No      Mobility  Bed Mobility Overal bed mobility: Needs Assistance Bed Mobility: Supine to Sit, Sit to Supine     Supine to sit: Mod assist Sit to supine: Mod assist   General bed mobility comments: multimodal cues for sequencing, assist for trunk elevation to come EOB and then assist for BLE back into bed for supine. assist for all aspects of line management    Transfers Overall transfer level: Needs assistance Equipment used: 2 person hand held assist Transfers: Sit to/from Stand Sit to Stand: Min assist, +2 physical assistance, +2 safety/equipment, From elevated surface (ED stretcher)           General transfer comment: min A for boost and balance, more assist on the left side    Ambulation/Gait Ambulation/Gait assistance: Min assist, +2 safety/equipment Gait Distance (Feet): 15 Feet Assistive device: 1 person hand held assist Gait Pattern/deviations: Step-through pattern, Decreased stride length Gait velocity: decreased Gait velocity interpretation: <1.31 ft/sec, indicative of household ambulator   General Gait Details: Handheld assist for postural stability and environmental guidance, max multimodal cueing due to vision and left inattention  Stairs            Wheelchair Mobility     Tilt Bed    Modified Rankin (Stroke Patients Only) Modified Rankin (Stroke Patients Only) Pre-Morbid Rankin Score: Slight disability Modified Rankin: Moderately severe disability     Balance Overall balance assessment: Needs assistance Sitting-balance support: Feet supported, Single extremity supported Sitting balance-Leahy Scale: Fair Sitting balance - Comments: unchallenged able to sit EOB with min guard close to supervision level   Standing balance support: Single extremity supported, Reliant on assistive device for balance Standing balance-Leahy Scale: Poor Standing balance comment: dependent on external assist from therapy  team  Pertinent Vitals/Pain Pain Assessment Pain Assessment: Faces Faces Pain Scale: No hurt    Home Living Family/patient expects to be discharged to:: Private residence Living Arrangements: Spouse/significant other Available Help at Discharge: Family Type of Home: House Home Access: Level entry       Home Layout: Two level;Able to live on main level with bedroom/bathroom Home Equipment: Shower seat;Cane - single point;Other (comment) (Duet Rollator that converts to transport chair) Additional Comments: has been driving (stick shift) and wife checks on him via the 360 app    Prior Function Prior Level of Function : Needs assist             Mobility Comments: independent ADLs Comments: pt spouse manages bills, medications     Extremity/Trunk Assessment   Upper Extremity Assessment Upper Extremity Assessment: Defer to OT evaluation LUE Deficits / Details: decreased sensation, decreased overall function. MMT revealed 3/5 grasp - strength seems to decrease distally down the arm LUE Sensation: decreased light touch;decreased proprioception LUE Coordination: decreased fine motor;decreased gross motor (fine >gross deficits)    Lower Extremity Assessment Lower Extremity Assessment: RLE deficits/detail;LLE deficits/detail RLE Deficits / Details: Strength 5/5 LLE Deficits / Details: Strength 5/5    Cervical / Trunk Assessment Cervical / Trunk Assessment: Normal  Communication   Communication Communication: Impaired Factors Affecting Communication: Hearing impaired (L ear better than R)    Cognition Arousal: Alert Behavior During Therapy: Flat affect   PT - Cognitive impairments: Awareness, Attention, Problem solving, Safety/Judgement                       PT - Cognition Comments: Decreased awareness of deficits Following commands: Impaired Following commands impaired: Follows one step commands with increased time  (multimodal cues increased response time)     Cueing Cueing Techniques: Verbal cues, Gestural cues, Tactile cues     General Comments General comments (skin integrity, edema, etc.): Wife "Trevor Fudge" present and supportive throughout session    Exercises     Assessment/Plan    PT Assessment Patient needs continued PT services  PT Problem List Decreased balance;Decreased mobility;Decreased coordination;Decreased cognition;Decreased safety awareness;Impaired sensation       PT Treatment Interventions Stair training;Functional mobility training;Gait training;Therapeutic activities;Therapeutic exercise;Balance training;Cognitive remediation;Patient/family education    PT Goals (Current goals can be found in the Care Plan section)  Acute Rehab PT Goals Patient Stated Goal: pt spouse agreeable to rehab PT Goal Formulation: With patient/family Time For Goal Achievement: 01/03/24 Potential to Achieve Goals: Good    Frequency Min 3X/week     Co-evaluation PT/OT/SLP Co-Evaluation/Treatment: Yes Reason for Co-Treatment: Necessary to address cognition/behavior during functional activity;For patient/therapist safety;To address functional/ADL transfers PT goals addressed during session: Mobility/safety with mobility;Balance;Strengthening/ROM OT goals addressed during session: ADL's and self-care;Proper use of Adaptive equipment and DME;Strengthening/ROM (Vision)       AM-PAC PT "6 Clicks" Mobility  Outcome Measure Help needed turning from your back to your side while in a flat bed without using bedrails?: A Lot Help needed moving from lying on your back to sitting on the side of a flat bed without using bedrails?: A Lot Help needed moving to and from a bed to a chair (including a wheelchair)?: A Little Help needed standing up from a chair using your arms (e.g., wheelchair or bedside chair)?: A Little Help needed to walk in hospital room?: A Little Help needed climbing 3-5 steps with a  railing? : A Lot 6 Click Score: 15    End of Session  Equipment Utilized During Treatment: Gait belt Activity Tolerance: Patient tolerated treatment well Patient left: in bed;with call bell/phone within reach;with bed alarm set Nurse Communication: Mobility status PT Visit Diagnosis: Unsteadiness on feet (R26.81);Difficulty in walking, not elsewhere classified (R26.2)    Time: 1610-9604 PT Time Calculation (min) (ACUTE ONLY): 23 min   Charges:   PT Evaluation $PT Eval Low Complexity: 1 Low   PT General Charges $$ ACUTE PT VISIT: 1 Visit         Verdia Glad, PT, DPT Acute Rehabilitation Services Office [phone]   Claria Crofts 12/20/2023, 1:30 PM

## 2023-12-20 NOTE — Evaluation (Signed)
 Speech Language Pathology Evaluation Patient Details Name: Tyler Guerra. MRN: [id number] DOB: 04-30-1951 Today's Date: 12/20/2023 Time: 4098-1191 SLP Time Calculation (min) (ACUTE ONLY): 9 min  Problem List:  Patient Active Problem List   Diagnosis Date Noted   Diabetes mellitus without complication (HCC) 12/05/2023   Health care maintenance 12/01/2023   Acute CVA (cerebrovascular accident) (HCC) 12/22/2022   CVA (cerebral vascular accident) (HCC) 12/22/2022   Constipation 03/04/2022   CAD (coronary artery disease) 05/24/2021   NSTEMI (non-ST elevated myocardial infarction) (HCC) 05/24/2021   Pneumothorax 10/11/2019   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   Knee joint symptom 03/13/2019   Left shoulder pain 09/04/2018   Anxiety state 03/16/2016   Pseudophakia of both eyes 02/24/2016   TIA (transient ischemic attack) 10/14/2015   BPV (benign positional vertigo) 11/02/2014   Advance care planning 05/13/2014   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   Hearing loss 04/21/2013   Ventral hernia 10/26/2011   Medicare annual wellness visit, subsequent 10/26/2011   Prostate cancer (HCC) 10/26/2011   Coronary atherosclerosis 07/19/2010   GERD 07/19/2010   Hyperlipidemia 07/18/2010   Gout 07/18/2010   Hypertension 07/18/2010   Past Medical History:  Past Medical History:  Diagnosis Date   Anxiety state 03/16/2016   Aortic stenosis    Mild to moderate by echo 12/2023 with mean aortic valve gradient 12 mmHg   Aphasia 01/26/2017   Benign localized prostatic hyperplasia with lower urinary tract symptoms (LUTS)    BPV (benign positional vertigo) 11/02/2014   Carotid artery stenosis    PER DUPLEX 10-22-2015  BILATERAL ICA 1-39%   Cataract    Coronary artery disease    CABG 2000 with LIMA> LAD, SVG > RCA and free radial > OM. S/P NSTEMI 05/2021 cath with luiminal Irrg SVG>RCA, occluded prox RCA, patent free radial>OM, occluded oLCx, occluded o1   Depression 01/26/2017   Diabetes  mellitus without complication (HCC) 03/02/2018   GERD 07/19/2010   Qualifier: Diagnosis of  By: Vallarie Gauze MD, Tyrone Gallop     GERD (gastroesophageal reflux disease)    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   Gout    Gout 07/18/2010   Qualifier: Diagnosis of  By: Vallarie Gauze MD, Tyrone Gallop     H/O eye injury    chronic changes to left eye after injury   Hand weakness 03/02/2018   Hearing loss 04/21/2013   History of TIA (transient ischemic attack)    03/ 2017  no residual after brief episode loss peripheral vision   HOH (hard of hearing)    Hyperglycemia 05/17/2016   Hyperlipemia    Hyperlipidemia 07/18/2010   Qualifier: Diagnosis of  By: Vallarie Gauze MD, Graham     Hypertension    HYPERTENSION, BENIGN ESSENTIAL 07/18/2010   Qualifier: Diagnosis of  By: Vallarie Gauze MD, Graham     Hypokalemia 01/26/2017   Knee pain 03/13/2019   Left shoulder pain 09/04/2018   Pneumothorax 10/11/2019   RIGHT    Prostate cancer (HCC) UROLOGIST-  DR GRAPEY/  ONCOLOGIST-  DR MANNING   dx 2015--- Stage T1c, Gleason 3+4, PSA 4.03, vol 44cc   PVC's (premature ventricular contractions)    Rash and nonspecific skin eruption 10/14/2015   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   S/P CABG x 05 Dec 1998   Skin lesion 10/14/2015   Stroke [facility])    TIA (transient ischemic attack) 10/14/2015   Ventral hernia 10/26/2011   Wears glasses    Past Surgical History:  Past Surgical History:  Procedure  Laterality Date   APPENDECTOMY  1975   CARDIAC CATHETERIZATION  12-22-2001   dr Kay Parson   severe native vessel disease mLAD 60-70%,  total occulsion pCFX  and pRCA/  widely patent saphenous vein , free radial , and LIMA grafts/  minminal lv dysfunction, ef 60%   CHEST TUBE INSERTION Right 10/11/2019   CORONARY ARTERY BYPASS GRAFT  12/1998   LIMA to LAD,  SVG to PDA and Diagonal, Free radial graft to OM   CORONARY STENT INTERVENTION N/A 05/26/2021   Procedure: CORONARY STENT INTERVENTION;  Surgeon: Kyra Phy, MD;  Location: [facility]  INVASIVE CV LAB;  Service: Cardiovascular;  Laterality: N/A;   Exericse treadmill test  last one 01-03-2014  dr Kay Parson   normal exercise tolerance w/ hypertensive repsonse,  no ischemic EKG changes, appropriate HR response & recovery ([facility] TM score 9;  Low Risk , PVC's w/ exertion)   EYE SURGERY     FRACTURE SURGERY     LEFT HEART CATH AND CORS/GRAFTS ANGIOGRAPHY N/A 05/26/2021   Procedure: LEFT HEART CATH AND CORS/GRAFTS ANGIOGRAPHY;  Surgeon: Kyra Phy, MD;  Location: [facility] INVASIVE CV LAB;  Service: Cardiovascular;  Laterality: N/A;   LOOP RECORDER INSERTION N/A 12/22/2022   Procedure: LOOP RECORDER INSERTION;  Surgeon: Tammie Fall, MD;  Location: [facility] INVASIVE CV LAB;  Service: Cardiovascular;  Laterality: N/A;   PROSTATE BIOPSY     RADIOACTIVE SEED IMPLANT N/A 01/13/2016   Procedure: RADIOACTIVE SEED IMPLANT/BRACHYTHERAPY IMPLANT;  Surgeon: Ann Barnacle, MD;  Location: [facility];  Service: Urology;  Laterality: N/A;   HPI:  Tyler Guerra. is a 73 y.o. male admitted 12/19/23 with left-sided weakness and right gaze preference, migraine. MRI revealed acute infarct within the right periatrial white matter. PMH includes hypertension, hyperlipidemia, TIA, prostate cancer, right ACA stroke in 5/24, gout, CAD, TIA, anxiety, BPPV, CABG and stents, HOH (L better than R)   Assessment / Plan / Recommendation Clinical Impression  Cognitive linguistic evaluation complete, limited as patient needing to use the bathroom. Patient presents with cognitive deficits in the areas of attention, awareness, memory, and reasoning. Noted to be impulsive and with decreased processing speed impacting other areas of cognition in addition to ability to follow multi-step commands. Left sided visual field cut noted. Did initiate some movements with left hand although has a right gaze preference, unable to r/o left sided inattention as well. Suspect deficits from previous CVA however no family  present to confirm. Will benefit from skilled SLP intervention.    SLP Assessment  SLP Recommendation/Assessment: Patient needs continued Speech Lanaguage Pathology Services SLP Visit Diagnosis: Cognitive communication deficit (R41.841)    Recommendations for follow up therapy are one component of a multi-disciplinary discharge planning process, led by the attending physician.  Recommendations may be updated based on patient status, additional functional criteria and insurance authorization.    Follow Up Recommendations  Acute inpatient rehab (3hours/day)    Assistance Recommended at Discharge  Frequent or constant Supervision/Assistance  Functional Status Assessment Patient has had a recent decline in their functional status and demonstrates the ability to make significant improvements in function in a reasonable and predictable amount of time.  Frequency and Duration min 2x/week  2 weeks      SLP Evaluation Cognition  Overall Cognitive Status: No family/caregiver present to determine baseline cognitive functioning (suspect different) Arousal/Alertness: Awake/alert Orientation Level: Oriented to person;Oriented to place;Oriented to time;Disoriented to situation Attention: Sustained Sustained Attention: Impaired Sustained Attention Impairment:  Verbal basic;Functional basic Memory: Impaired Memory Impairment: Storage deficit;Retrieval deficit Awareness: Impaired Awareness Impairment: Intellectual impairment Problem Solving: Impaired Problem Solving Impairment: Verbal basic Executive Function: Decision Making (impulsive) Safety/Judgment: Impaired Comments: impulsive       Comprehension  Auditory Comprehension Overall Auditory Comprehension: Impaired Yes/No Questions: Within Functional Limits Commands: Impaired Multistep Basic Commands: 75-100% accurate Visual Recognition/Discrimination Discrimination: Within Function Limits Reading Comprehension Reading Status: Not tested     Expression Expression Primary Mode of Expression: Verbal Verbal Expression Overall Verbal Expression: Appears within functional limits for tasks assessed   Oral / Motor  Oral Motor/Sensory Function Overall Oral Motor/Sensory Function: Within functional limits Motor Speech Overall Motor Speech: Appears within functional limits for tasks assessed           Delsa Fife MA, CCC-SLP  Michalla Ringer Meryl 12/20/2023, 1:33 PM

## 2023-12-20 NOTE — TOC CAGE-AID Note (Signed)
 Transition of Care (TOC) - CAGE-AID Screening   Patient Details  Name: Tyler Guerra. MRN: [id number] Date of Birth: Dec 23, 1950  Transition of Care [facility] CM/SW Contact:    [name], LCSW Phone Number: 12/20/2023, 10:19 AM   Clinical Narrative:    CAGE-AID Screening:    Have You Ever Felt You Ought to Cut Down on Your Drinking or Drug Use?: No Have People Annoyed You By [employer] Your Drinking Or Drug Use?: No Have You Felt Bad Or Guilty About Your Drinking Or Drug Use?: No Have You Ever Had a Drink or Used Drugs First Thing In The Morning to Steady Your Nerves or to Get Rid of a Hangover?: No CAGE-AID Score: 0  Substance Abuse Education Offered: No

## 2023-12-21 ENCOUNTER — Other Ambulatory Visit: Payer: Self-pay | Admitting: Cardiology

## 2023-12-21 ENCOUNTER — Inpatient Hospital Stay (HOSPITAL_COMMUNITY)

## 2023-12-21 ENCOUNTER — Encounter (HOSPITAL_COMMUNITY)

## 2023-12-21 DIAGNOSIS — R29704 NIHSS score 4: Secondary | ICD-10-CM | POA: Diagnosis not present

## 2023-12-21 DIAGNOSIS — E1151 Type 2 diabetes mellitus with diabetic peripheral angiopathy without gangrene: Secondary | ICD-10-CM | POA: Diagnosis not present

## 2023-12-21 DIAGNOSIS — I6381 Other cerebral infarction due to occlusion or stenosis of small artery: Secondary | ICD-10-CM | POA: Diagnosis not present

## 2023-12-21 DIAGNOSIS — I639 Cerebral infarction, unspecified: Secondary | ICD-10-CM | POA: Diagnosis not present

## 2023-12-21 DIAGNOSIS — E785 Hyperlipidemia, unspecified: Secondary | ICD-10-CM | POA: Diagnosis not present

## 2023-12-21 LAB — CBC
HCT: 36 % — ABNORMAL LOW (ref 39.0–52.0)
Hemoglobin: 12.5 g/dL — ABNORMAL LOW (ref 13.0–17.0)
MCH: 29.9 pg (ref 26.0–34.0)
MCHC: 34.7 g/dL (ref 30.0–36.0)
MCV: 86.1 fL (ref 80.0–100.0)
Platelets: 150 10*3/uL (ref 150–400)
RBC: 4.18 MIL/uL — ABNORMAL LOW (ref 4.22–5.81)
RDW: 12.4 % (ref 11.5–15.5)
WBC: 7.1 10*3/uL (ref 4.0–10.5)
nRBC: 0 % (ref 0.0–0.2)

## 2023-12-21 LAB — BASIC METABOLIC PANEL WITH GFR
Anion gap: 9 (ref 5–15)
BUN: 19 mg/dL (ref 8–23)
CO2: 22 mmol/L (ref 22–32)
Calcium: 9 mg/dL (ref 8.9–10.3)
Chloride: 107 mmol/L (ref 98–111)
Creatinine, Ser: 1.03 mg/dL (ref 0.61–1.24)
GFR, Estimated: >60 mL/min (ref >60)
Glucose, Bld: 104 mg/dL — ABNORMAL HIGH (ref 70–99)
Potassium: 3.7 mmol/L (ref 3.5–5.1)
Sodium: 138 mmol/L (ref 135–145)

## 2023-12-21 LAB — PROCALCITONIN: Procalcitonin: <0.1 ng/mL

## 2023-12-21 MED ORDER — TICAGRELOR 90 MG PO TABS
90.0000 mg | ORAL_TABLET | Freq: Two times a day (BID) | ORAL | Status: DC
Start: 2023-12-21 — End: 2023-12-22
  Administered 2023-12-21 – 2023-12-22 (×2): 90 mg via ORAL
  Filled 2023-12-21 (×2): qty 1

## 2023-12-21 NOTE — PMR Pre-admission (Signed)
 PMR Admission Coordinator Pre-Admission Assessment  Patient: Tyler Guerra. is an 73 y.o., male MRN: [id number] DOB: [birth year] Height:   Weight: 95.4 kg  Insurance Information HMO:     PPO:      PCP:      IPA:      80/20:      OTHER:  PRIMARY: Medicare Part A and B      Policy#: [id number]       Subscriber: pt CM Name:       Phone#:      Fax#:  Pre-Cert#: verified Health and safety inspector:  Benefits:  Phone #:      Name:  Eff. Date: A/B 12/01/16     Deduct: $1676      Out of Pocket Max: n/a      Life Max: n/a CIR: 100%      SNF: 20 full days Outpatient: 80%     Co-Pay: 20% Home Health: 100%      Co-Pay:  DME: 80%     Co-Pay: 20% Providers:  SECONDARY: BCBS Supplement      Policy#: [id number]     Phone#: [phone]  Financial Counselor:       Phone#:   The "Data Collection Information Summary" for patients in Inpatient Rehabilitation Facilities with attached "Privacy Act Statement-Health Care Records" was provided and verbally reviewed with: Patient and Family  Emergency Contact Information Contact Information     Name Relation Home Work Mobile   Tyler Guerra Spouse [phone]        Other Contacts     Name Relation Home Work Mobile   Tyler Guerra Daughter   [phone]   Tyler Guerra Daughter   [phone]       Current Medical History  Patient Admitting Diagnosis: CVA   History of Present Illness: Pt is a 73 y/o male with PMH of HTN, prostate cancer, right ACA stroke in 12/2022, gout, CAD, anxiety, BPPV, CABG, and HOH who presented to [facility] on 12/19/23 with c/o left sided hemiparesis and R gaze preference with associated HA.  In ED NIHSS 11, CT head negative, CTA with focal occlusion vs severe near occlusion of right ACA A3 segment.  Febrile to 100.8 but no leukocytosis.  MRI showed small stroke in the right parietal white matter.  Neurology recommendations for aspirin  and plavix  and loop recorder interrogation.  EEG showed moderate diffuse encephalopathy but  no seizures, loop showed no afib.  Fever workup negative so broad spectrum abx stopped.  Therapy evaluations were completed and pt was recommended for CIR.   Complete NIHSS TOTAL: 4  Patient's medical record from Tyler Guerra has been reviewed by the rehabilitation admission coordinator and physician.  Past Medical History  Past Medical History:  Diagnosis Date   Anxiety state 03/16/2016   Aortic stenosis    Mild to moderate by echo 12/2023 with mean aortic valve gradient 12 mmHg   Aphasia 01/26/2017   Benign localized prostatic hyperplasia with lower urinary tract symptoms (LUTS)    BPV (benign positional vertigo) 11/02/2014   Carotid artery stenosis    PER DUPLEX 10-22-2015  BILATERAL ICA 1-39%   Cataract    Coronary artery disease    CABG 2000 with LIMA> LAD, SVG > RCA and free radial > OM. S/P NSTEMI 05/2021 cath with luiminal Irrg SVG>RCA, occluded prox RCA, patent free radial>OM, occluded oLCx, occluded o1   Depression 01/26/2017   Diabetes mellitus without complication (HCC) 03/02/2018   GERD 07/19/2010   Qualifier: Diagnosis  of  By: Vallarie Gauze MD, Tyrone Gallop     GERD (gastroesophageal reflux disease)    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   Gout    Gout 07/18/2010   Qualifier: Diagnosis of  By: Vallarie Gauze MD, Tyrone Gallop     H/O eye injury    chronic changes to left eye after injury   Hand weakness 03/02/2018   Hearing loss 04/21/2013   History of TIA (transient ischemic attack)    03/ 2017  no residual after brief episode loss peripheral vision   HOH (hard of hearing)    Hyperglycemia 05/17/2016   Hyperlipemia    Hyperlipidemia 07/18/2010   Qualifier: Diagnosis of  By: Vallarie Gauze MD, Graham     Hypertension    HYPERTENSION, BENIGN ESSENTIAL 07/18/2010   Qualifier: Diagnosis of  By: Vallarie Gauze MD, Graham     Hypokalemia 01/26/2017   Knee pain 03/13/2019   Left shoulder pain 09/04/2018   Pneumothorax 10/11/2019   RIGHT    Prostate cancer (HCC) UROLOGIST-  DR GRAPEY/  ONCOLOGIST-  DR MANNING   dx  2015--- Stage T1c, Gleason 3+4, PSA 4.03, vol 44cc   PVC's (premature ventricular contractions)    Rash and nonspecific skin eruption 10/14/2015   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 No  Family History   family history includes Anxiety disorder in his sister; Cancer in his father, paternal uncle, and sister; Colon cancer in his maternal grandfather; Dementia in his mother; Heart disease in his father and mother; Prostate cancer in his father; Stroke in his father.  Current Medications  Current Facility-Administered Medications:    acetaminophen  (TYLENOL ) tablet 650 mg, 650 mg, Oral, Q4H PRN, 650 mg at 12/19/23 1738 **OR** acetaminophen  (TYLENOL ) 160 MG/5ML solution 650 mg, 650 mg, Per Tube, Q4H PRN **OR** acetaminophen  (TYLENOL ) suppository 650 mg, 650 mg, Rectal, Q4H PRN, Michela Aguas, MD   allopurinol  (ZYLOPRIM ) tablet 300 mg, 300 mg, Oral, Daily, Kamineni, Neelima, MD, 300 mg at 12/21/23 0827   aspirin  EC tablet 81 mg, 81 mg, Oral, Daily, Kamineni, Neelima, MD, 81 mg at 12/21/23 0827   atorvastatin  (LIPITOR ) tablet 80 mg, 80 mg, Oral, Daily, Michela Aguas, MD, 80 mg at 12/21/23 0827   cholecalciferol  (VITAMIN D3) 25 MCG (1000 UNIT) tablet 1,000 Units, 1,000 Units, Oral, Daily, Michela Aguas, MD, 1,000 Units at 12/21/23 0827   enoxaparin  (LOVENOX ) injection 40 mg, 40 mg, Subcutaneous, Q24H, Kamineni, Neelima, MD, 40 mg at 12/20/23 1746   escitalopram  (LEXAPRO ) tablet 5 mg, 5 mg, Oral, Daily, Kamineni, Neelima, MD, 5 mg at 12/21/23 0827   nitroGLYCERIN  (NITROSTAT ) SL tablet 0.4 mg, 0.4 mg, Sublingual, Q5 min PRN, Michela Aguas, MD   pantoprazole  (PROTONIX ) EC tablet 40 mg, 40 mg, Oral, Daily, Kamineni, Neelima, MD, 40 mg at  12/21/23 0827   polyethylene glycol powder (GLYCOLAX /MIRALAX ) container 17 g, 17 g, Oral, Daily PRN, Kamineni, Neelima, MD   polyvinyl alcohol  (LIQUIFILM TEARS) 1.4 % ophthalmic solution 1 drop, 1 drop, Both Eyes, Daily PRN, Synthia Ewing, RPH   tamsulosin  (FLOMAX ) capsule 0.4 mg, 0.4 mg, Oral, Daily, Kamineni, Neelima, MD, 0.4 mg at 12/21/23 0827   ticagrelor  (BRILINTA ) tablet 90 mg, 90 mg, Oral, BID, Lehner, Erin C, NP  Patients  Current Diet:  Diet Order             Diet Heart Room service appropriate? Yes; Fluid consistency: Thin  Diet effective now                   Precautions / Restrictions Precautions Precautions: Fall Restrictions Weight Bearing Restrictions Per Provider Order: No   Has the patient had 2 or more falls or a fall with injury in the past year? No  Prior Activity Level Community (5-7x/wk): independent with mobility/ADLs, assist for med management/finances, driving short distances, no DME  Prior Functional Level Self Care: Did the patient need help bathing, dressing, using the toilet or eating? Independent  Indoor Mobility: Did the patient need assistance with walking from room to room (with or without device)? Independent  Stairs: Did the patient need assistance with internal or external stairs (with or without device)? Independent  Functional Cognition: Did the patient need help planning regular tasks such as shopping or remembering to take medications? Needed some help  Patient Information Are you of Hispanic, Latino/a,or Spanish origin?: A. No, not of Hispanic, Latino/a, or Spanish origin What is your race?: K. Native Hawaiian Do you need or want an interpreter to communicate with a doctor or health care staff?: 0. No  Patient's Response To:  Health Literacy and Transportation Is the patient able to respond to health literacy and transportation needs?: Yes Health Literacy - How often do you need to have someone help you when you read  instructions, pamphlets, or other written material from your doctor or pharmacy?: Never In the past 12 months, has lack of transportation kept you from medical appointments or from getting medications?: No In the past 12 months, has lack of transportation kept you from meetings, work, or from getting things needed for daily living?: No  Journalist, newspaper / Equipment Home Equipment: Shower seat, Medical laboratory scientific officer - single point, Other (comment) (Duet Rollator that converts to transport chair)  Prior Device Use: Indicate devices/aids used by the patient prior to current illness, exacerbation or injury? None of the above  Current Functional Level Cognition  Arousal/Alertness: Awake/alert Overall Cognitive Status: No family/caregiver present to determine baseline cognitive functioning (suspect different) Orientation Level: Oriented to person, Oriented to place, Oriented to time, Disoriented to situation Attention: Sustained Sustained Attention: Impaired Sustained Attention Impairment: Verbal basic, Functional basic Memory: Impaired Memory Impairment: Storage deficit, Retrieval deficit Awareness: Impaired Awareness Impairment: Intellectual impairment Problem Solving: Impaired Problem Solving Impairment: Verbal basic Executive Function: Decision Making (impulsive) Safety/Judgment: Impaired Comments: impulsive    Extremity Assessment (includes Sensation/Coordination)  Upper Extremity Assessment: Defer to OT evaluation LUE Deficits / Details: decreased sensation, decreased overall function. MMT revealed 3/5 grasp - strength seems to decrease distally down the arm LUE Sensation: decreased light touch, decreased proprioception LUE Coordination: decreased fine motor, decreased gross motor (fine >gross deficits)  Lower Extremity Assessment: RLE deficits/detail, LLE deficits/detail RLE Deficits / Details: Strength 5/5 LLE Deficits / Details: Strength 5/5    ADLs  Overall ADL's : Needs  assistance/impaired Eating/Feeding: NPO Grooming: Wash/dry face, Moderate assistance, Sitting Upper Body Bathing: Moderate assistance Upper Body Bathing Details (indicate cue type and reason): for back Lower Body Bathing: Moderate assistance, Sitting/lateral leans Lower Body Bathing Details (indicate cue type and reason): multimodal cues to attend to left side Upper Body Dressing : Moderate assistance Upper Body Dressing Details (indicate cue type and reason): new gown Lower Body Dressing: Maximal assistance Toilet Transfer: Minimal assistance, +2 for safety/equipment, Ambulation Toilet  Transfer Details (indicate cue type and reason): 2 person HHA, assist for initiation, safety with navigating environment Toileting- Clothing Manipulation and Hygiene: Moderate assistance, Sit to/from stand Functional mobility during ADLs: Minimal assistance, +2 for safety/equipment, +2 for physical assistance (2 person HHA) General ADL Comments: L inattention, L visual deficits, decreaseb balance, decreased cog    Mobility  Overal bed mobility: Needs Assistance Bed Mobility: Supine to Sit, Sit to Supine Supine to sit: Mod assist Sit to supine: Mod assist General bed mobility comments: multimodal cues for sequencing, assist for trunk elevation to come EOB and then assist for BLE back into bed for supine. assist for all aspects of line management    Transfers  Overall transfer level: Needs assistance Equipment used: 2 person hand held assist Transfers: Sit to/from Stand Sit to Stand: Min assist, +2 physical assistance, +2 safety/equipment, From elevated surface (ED stretcher) General transfer comment: min A for boost and balance, more assist on the left side    Ambulation / Gait / Stairs / Wheelchair Mobility  Ambulation/Gait Ambulation/Gait assistance: Min assist, +2 safety/equipment Gait Distance (Feet): 15 Feet Assistive device: 1 person hand held assist Gait Pattern/deviations: Step-through  pattern, Decreased stride length General Gait Details: Handheld assist for postural stability and environmental guidance, max multimodal cueing due to vision and left inattention Gait velocity: decreased Gait velocity interpretation: <1.31 ft/sec, indicative of household ambulator    Posture / Balance Dynamic Sitting Balance Sitting balance - Comments: unchallenged able to sit EOB with min guard close to supervision level Balance Overall balance assessment: Needs assistance Sitting-balance support: Feet supported, Single extremity supported Sitting balance-Leahy Scale: Fair Sitting balance - Comments: unchallenged able to sit EOB with min guard close to supervision level Standing balance support: Single extremity supported, Reliant on assistive device for balance Standing balance-Leahy Scale: Poor Standing balance comment: dependent on external assist from therapy team    Special needs/care consideration Diabetic management A1C 6.7   Previous Home Environment (from acute therapy documentation) Living Arrangements: Spouse/significant other Available Help at Discharge: Family Type of Home: House Home Layout: Two level, Able to live on main level with bedroom/bathroom Home Access: Level entry Bathroom Shower/Tub: Engineer, manufacturing systems: Standard Additional Comments: has been driving (stick shift) and wife checks on him via the 360 app  Discharge Living Setting Plans for Discharge Living Setting: Patient's home, Lives with (comment) (spouse, Trevor Fudge) Type of Home at Discharge: House Discharge Home Layout: Able to live on main level with bedroom/bathroom Discharge Home Access: Level entry Discharge Bathroom Shower/Tub: Tub/shower unit, Walk-in shower (walk in on second story) Discharge Bathroom Toilet: Standard Discharge Bathroom Accessibility: Yes How Accessible: Accessible via walker Does the patient have any problems obtaining your medications?:  No  Social/Family/Support Systems Patient Roles: Spouse, Parent Contact Information: 2 adult daughters who live within a five minute radius Anticipated Caregiver: wife, Trevor Fudge Anticipated Industrial/product designer Information: [phone] Ability/Limitations of Caregiver: none stated Caregiver Availability: 24/7 Discharge Plan Discussed with Primary Caregiver: Yes Is Caregiver In Agreement with Plan?: Yes Does Caregiver/Family have Issues with Lodging/Transportation while Pt is in Rehab?: No  Goals Patient/Family Goal for Rehab: PT supervision, OT supervision to min assist, SLP supervision Expected length of stay: 10-14 days Additional Information: Discharge plan: home with spouse 24/7, daughters nearby and supportive as well Pt/Family Agrees to Admission and willing to participate: Yes Program Orientation Provided & Reviewed with Pt/Caregiver Including Roles  & Responsibilities: Yes  Decrease burden of Care through IP rehab admission: no  Possible need for SNF  placement upon discharge: Not anticipated.  Plan for discharge home with spouse 24/7  Patient Condition: I have reviewed medical records from [facility], spoken with [facility] team, and patient and spouse. I met with patient at the bedside for inpatient rehabilitation assessment.  Patient will benefit from ongoing PT, OT, and SLP, can actively participate in 3 hours of therapy a day 5 days of the week, and can make measurable gains during the admission.  Patient will also benefit from the coordinated team approach during an Inpatient Acute Rehabilitation admission.  The patient will receive intensive therapy as well as Rehabilitation physician, nursing, social worker, and care management interventions.  Due to safety, skin/wound care, disease management, medication administration, pain management, and patient education the patient requires 24 hour a day rehabilitation nursing.  The patient is currently min assist with mobility and mod to max  basic ADLs.  Discharge setting and therapy post discharge at home with home health is anticipated.  Patient has agreed to participate in the Acute Inpatient Rehabilitation Program and will admit today.  Preadmission Screen Completed By:  Mickey Alar, PT, DPT 12/21/2023 4:26 PM ______________________________________________________________________   Discussed status with Dr. Rayleen Cal on 12/22/23  at 9:58 AM  and received approval for admission today.  Admission Coordinator:  Caitlin E Warren, PT, time 9:58 AM Alanna Hu 12/22/23    Assessment/Plan: Diagnosis: CVA Does the need for close, 24 hr/day Medical supervision in concert with the patient's rehab needs make it unreasonable for this patient to be served in a less intensive setting? Yes Co-Morbidities requiring supervision/potential complications: HLD, HTN, Fever, mood disorder, Obesity Due to bladder management, bowel management, safety, skin/wound care, disease management, medication administration, pain management, and patient education, does the patient require 24 hr/day rehab nursing? Yes Does the patient require coordinated care of a physician, rehab nurse, PT, OT, and SLP to address physical and functional deficits in the context of the above medical diagnosis(es)? Yes Addressing deficits in the following areas: balance, endurance, locomotion, strength, transferring, bowel/bladder control, bathing, dressing, feeding, grooming, toileting, cognition, speech, language, swallowing, and psychosocial support Can the patient actively participate in an intensive therapy program of at least 3 hrs of therapy 5 days a week? Yes The potential for patient to make measurable gains while on inpatient rehab is excellent Anticipated functional outcomes upon discharge from inpatient rehab: supervision PT, supervision and min assist OT, supervision SLP Estimated rehab length of stay to reach the above functional goals is: 10-14 Anticipated discharge  destination: Home 10. Overall Rehab/Functional Prognosis: excellent   MD Signature: [name]

## 2023-12-21 NOTE — Plan of Care (Signed)
 Pt has rested quietly throughout the night with no distress noted. Alert and oriented to person and place. On room air. SR on the monitor. Pt has voided in urinal and been incontinent in the bed and on the floor. Pt jumps out of bed. Bed alarm on at all times. Daughter at bedside part of the night. Pt keeps pulling leads off. Pt is HOH. No complaints of pain voiced.     Problem: Ischemic Stroke/TIA Tissue Perfusion: Goal: Complications of ischemic stroke/TIA will be minimized Outcome: Progressing   Problem: Self-Care: Goal: Ability to participate in self-care as condition permits will improve Outcome: Progressing Goal: Ability to communicate needs accurately will improve Outcome: Progressing   Problem: Clinical Measurements: Goal: Cardiovascular complication will be avoided Outcome: Progressing   Problem: Pain Managment: Goal: General experience of comfort will improve and/or be controlled Outcome: Progressing

## 2023-12-21 NOTE — Progress Notes (Deleted)
 [facility]                                                                               Tyler Guerra, is a 73 y.o. male, DOB - February 23, 1951, WUJ:[id number] Admit date - 12/19/2023    Outpatient Primary MD for the patient is Tyler Galea, MD  LOS - 2  days    Brief summary   Tyler Guerra. is a 73 y.o. 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    Assessment & Plan    Assessment and Plan:   Acute right parietal CVA:  Continues to have left sided neglect and right gaze preference.  Neurology on board.  Stroke work up in progress.  Mri Brain shows 3 mm acute infarct within the right periatrial white matter.  CTA of the head and neck shows Focal occlusion versus severe, near-occlusive stenosis of the right anterior cerebral artery A3 segment. The common carotid and internal carotid arteries are patent within the neck without stenosis. The vertebral arteries are patent.  Recent 2D ECHO reviewed.  LDL is 51, A1c IS 6.7% Continue with aspirin  and plavix  for 4 weeks followed by aspirin  as per neurology.  Continue with lipitor  80 mg daily.  Therapy evaluations are pending.    Fever earlier this am.  UA is negative.  Patient empirically started on rocephin  and azithromycin  for possible aspiration event.  Check SLP evaluation.    CAD Stable, no chest pain.    Hypertension Well controlled.    Depression/ anxiety Resume home meds.    BPH Resume flomax .    Estimated body mass index is 30.18 kg/m as calculated from the following:   Height as of 11/30/23: 5\' 10"  (1.778 m).   Weight as of this encounter: 95.4 kg.  Code Status: full code.  DVT Prophylaxis:  enoxaparin  (LOVENOX ) injection 40 mg Start: 12/19/23  1700   Level of Care: Level of care: Telemetry Medical Family Communication: none at bedside.   Disposition Plan:     Remains inpatient appropriate:  pending further work up/ CIR work up.   Procedures:  MRI brain Echocardiogram.   Consultants:   Neurology.   Antimicrobials:   Anti-infectives (From admission, onward)    Start     Dose/Rate Route Frequency Ordered Stop   12/20/23 1900  azithromycin  (ZITHROMAX ) tablet 500 mg        500 mg Oral Daily 12/20/23 1801 12/23/23 0959   12/19/23 1730  cefTRIAXone  (ROCEPHIN ) 1 g in sodium chloride  0.9 % 100 mL IVPB        1 g 200 mL/hr over 30 Minutes Intravenous Every 24 hours 12/19/23 1724          Medications  Scheduled Meds:  allopurinol   300 mg Oral Daily   aspirin  EC  81 mg Oral Daily   atorvastatin   80 mg Oral Daily   azithromycin   500 mg Oral Daily   cholecalciferol   1,000 Units Oral Daily  enoxaparin  (LOVENOX ) injection  40 mg Subcutaneous Q24H   escitalopram   5 mg Oral Daily   pantoprazole   40 mg Oral Daily   tamsulosin   0.4 mg Oral Daily   ticagrelor   90 mg Oral BID   Continuous Infusions:  cefTRIAXone  (ROCEPHIN )  IV 1 g (12/20/23 1745)   PRN Meds:.acetaminophen  **OR** acetaminophen  (TYLENOL ) oral liquid 160 mg/5 mL **OR** acetaminophen , nitroGLYCERIN , polyethylene glycol powder, polyvinyl alcohol     Subjective:   Tyler Guerra was seen and examined today.  No new complaints today.  Objective:   Vitals:   12/21/23 0025 12/21/23 0400 12/21/23 0746 12/21/23 1227  BP: (!) 182/88 (!) 172/87 (!) 154/65 (!) 143/75  Pulse: 69 62 69 67  Resp: 12 12 14 17   Temp: 99.8 F (37.7 C) 99.6 F (37.6 C)  99.3 F (37.4 C)  TempSrc: Oral Oral Oral Oral  SpO2: 94% 94% 96% 95%  Weight:        Intake/Output Summary (Last 24 hours) at 12/21/2023 1310 Last data filed at 12/21/2023 1228 Gross per 24 hour  Intake --  Output 885 ml  Net -885 ml   Filed Weights   12/19/23 1100  Weight: 95.4 kg     Exam General  exam: Appears calm and comfortable  Respiratory system: Clear to auscultation. Respiratory effort normal. Cardiovascular system: S1 & S2 heard, RRR.  Gastrointestinal system: Abdomen is nondistended, soft and nontender.  Central nervous system: Alert, left sided neglect, very hard of hearing.  Extremities: Symmetric 5 x 5 power. Skin: No rashes, Psychiatry: unable to assess due to confusion.    Data Reviewed:  I have personally reviewed following labs and imaging studies   CBC Lab Results  Component Value Date   WBC 7.1 12/21/2023   RBC 4.18 (L) 12/21/2023   HGB 12.5 (L) 12/21/2023   HCT 36.0 (L) 12/21/2023   MCV 86.1 12/21/2023   MCH 29.9 12/21/2023   PLT 150 12/21/2023   MCHC 34.7 12/21/2023   RDW 12.4 12/21/2023   LYMPHSABS 1.2 12/19/2023   MONOABS 0.3 12/19/2023   EOSABS 0.0 12/19/2023   BASOSABS 0.0 12/19/2023     Last metabolic panel Lab Results  Component Value Date   NA 138 12/21/2023   K 3.7 12/21/2023   CL 107 12/21/2023   CO2 22 12/21/2023   BUN 19 12/21/2023   CREATININE 1.03 12/21/2023   GLUCOSE 104 (H) 12/21/2023   GFRNONAA >60 12/21/2023   GFRAA >60 10/12/2019   CALCIUM  9.0 12/21/2023   PHOS 3.6 12/22/2022   PROT 7.0 12/19/2023   ALBUMIN 3.7 12/19/2023   BILITOT 1.6 (H) 12/19/2023   ALKPHOS 97 12/19/2023   AST 25 12/19/2023   ALT 16 12/19/2023   ANIONGAP 9 12/21/2023    CBG (last 3)  Recent Labs    12/19/23 1154 12/20/23 2350  GLUCAP 169* 85      Coagulation Profile: Recent Labs  Lab 12/19/23 1154  INR 1.2     Radiology Studies: DG CHEST PORT 1 VIEW Result Date: 12/21/2023 CLINICAL DATA:  Fever. EXAM: PORTABLE CHEST 1 VIEW COMPARISON:  Dec 19, 2023. FINDINGS: Stable cardiomediastinal silhouette. Status post coronary bypass graft. Lungs are clear. Bony thorax is unremarkable. IMPRESSION: No active disease. Electronically Signed   By: Tyler Guerra M.D.   On: 12/21/2023 10:23   EEG adult Result Date: 12/19/2023 Tyler Lack, MD     12/19/2023  6:35 PM Patient Name: Tyler Guerra. MRN: [id number] Epilepsy Attending: Priyanka O Guerra  Referring Physician/Provider: de Thayne Fine, Tyler Proffer, NP Date: 12/19/2023 Duration: 21.27 mins Patient history: 73yo M with altered mental status, left-sided weakness, left-sided neglect and right gaze deviation. EEG to evaluate for seizure Level of alertness: Awake AEDs during EEG study: None Technical aspects: This EEG study was done with scalp electrodes positioned according to the 10-20 International system of electrode placement. Electrical activity was reviewed with band pass filter of 1-70Hz , sensitivity of 7 uV/mm, display speed of 75mm/sec with a 60Hz  notched filter applied as appropriate. EEG data were recorded continuously and digitally stored.  Video monitoring was available and reviewed as appropriate. Description: EEG showed continuous generalized 3 to 6 Hz theta-delta slowing. Hyperventilation and photic stimulation were not performed.   ABNORMALITY - Continuous slow, generalized IMPRESSION: This study is suggestive of moderate diffuse encephalopathy. No seizures or epileptiform discharges were seen throughout the recording. Tyler Guerra   MR BRAIN WO CONTRAST Result Date: 12/19/2023 CLINICAL DATA:  Provided history: Stroke, follow-up. EXAM: MRI HEAD WITHOUT CONTRAST TECHNIQUE: Multiplanar, multiecho pulse sequences of the brain and surrounding structures were obtained without intravenous contrast. COMPARISON:  Non-contrast head CT and CT angiogram head/neck performed earlier today 12/19/2023. Brain MRI 12/21/2022. FINDINGS: Brain: Generalized cerebral atrophy. 3 mm acute infarct within the right periatrial white matter (series 9, image 53). Known chronic right anterior cerebral artery territory infarct within the medial right frontal lobe and corpus callosum. Adjacent chronic infarct within the right frontal lobe periventricular white matter, extending inferiorly to the  right basal ganglia. Small chronic cortical infarct within the anterior left frontal lobe. Chronic lacunar infarcts within the cerebral hemispheric white matter on the left. Additional small chronic lacunar infarct within the left subinsular white matter. Background advanced patchy and confluent T2 FLAIR hyperintense signal abnormality within the cerebral white matter, nonspecific but compatible with chronic small vessel ischemic disease. Few punctate chronic microhemorrhages scattered within the supratentorial brain. No evidence of an intracranial mass. No extra-axial fluid collection. No midline shift. Vascular: Please see CTA head/neck performed earlier today. Skull and upper cervical spine: No focal worrisome marrow lesion. Slight C2-C3 grade 1 anterolisthesis. Sinuses/Orbits: No mass or acute finding within the imaged orbits. Prior left ocular lens replacement. No significant paranasal sinus disease. IMPRESSION: 1. 3 mm acute infarct within the right periatrial white matter. 2. Background parenchymal atrophy, chronic small vessel disease and redemonstrated chronic infarcts as described. Electronically Signed   By: Bascom Lily D.O.   On: 12/19/2023 14:46   DG Chest Port 1 View Result Date: 12/19/2023 CLINICAL DATA:  Fever.  Neurologic deficits. EXAM: PORTABLE CHEST 1 VIEW COMPARISON:  AP chest 05/22/2021 FINDINGS: Status post median sternotomy and CABG. Cardiac silhouette is at the upper limits of normal size. Mediastinal contours are within limits. Interval decrease in now moderately low lung volumes. No definite acute airspace opacity. No pleural effusion or pneumothorax. No acute skeletal abnormality. IMPRESSION: Interval decrease in now moderately low lung volumes. No definite acute airspace opacity. Electronically Signed   By: Bertina Broccoli M.D.   On: 12/19/2023 13:46       Seena Dadds M.D. [facility] 12/21/2023, 1:10 PM  Available via Epic secure chat 7am-7pm After 7 pm, please  refer to night coverage provider listed on amion.

## 2023-12-21 NOTE — TOC CM/SW Note (Signed)
 Transition of Care [facility]) - Inpatient Brief Assessment   Patient Details  Name: Tyler Guerra. MRN: [id number] Date of Birth: 04-21-1951  [facility] CM/SW Contact:    Jannice Mends, LCSW Phone Number: 12/21/2023, 1:19 PM   Clinical Narrative: Patient admitted from home with spouse. TOC following for CIR determination of candidacy and bed availability once medically stable for discharge.    Transition of Care Asessment: Insurance and Status: Insurance coverage has been reviewed Patient has primary care physician: Yes Home environment has been reviewed: From home Prior level of function:: Modified Independent Prior/Current Home Services: No current home services Social Drivers of Health Review: SDOH reviewed no interventions necessary Readmission risk has been reviewed: Yes Transition of care needs: transition of care needs identified, TOC will continue to follow

## 2023-12-21 NOTE — Progress Notes (Signed)
 STROKE TEAM PROGRESS NOTE    INTERIM HISTORY/SUBJECTIVE  Patient presented 5/18 complaining of left-sided weakness, left-sided neglect, altered mental status, right gaze deviation.  Febrile with elevated lactic acid, but no leukocytosis on admission. MRI shows tiny right parietal periventricular white matter infarct.  Patient has history of chronic infarcts with most recent stroke being in May 2024.  He is on aspirin  and Plavix  at home prior to admission.  EEG negative.  Wife at bedside during rounds.  Patient complaining of continued headache.  Continues to have confusion, left-sided weakness, left-sided facial droop, left hemianopia.  OBJECTIVE  CBC    Component Value Date/Time   WBC 7.1 12/21/2023 1103   RBC 4.18 (L) 12/21/2023 1103   HGB 12.5 (L) 12/21/2023 1103   HCT 36.0 (L) 12/21/2023 1103   PLT 150 12/21/2023 1103   MCV 86.1 12/21/2023 1103   MCH 29.9 12/21/2023 1103   MCHC 34.7 12/21/2023 1103   RDW 12.4 12/21/2023 1103   LYMPHSABS 1.2 12/19/2023 1154   MONOABS 0.3 12/19/2023 1154   EOSABS 0.0 12/19/2023 1154   BASOSABS 0.0 12/19/2023 1154    BMET    Component Value Date/Time   NA 138 12/21/2023 1103   NA 142 10/22/2021 1047   K 3.7 12/21/2023 1103   CL 107 12/21/2023 1103   CO2 22 12/21/2023 1103   GLUCOSE 104 (H) 12/21/2023 1103   GLUCOSE 91 08/06/2010 1536   BUN 19 12/21/2023 1103   BUN 20 10/22/2021 1047   CREATININE 1.03 12/21/2023 1103   CALCIUM  9.0 12/21/2023 1103   EGFR 72 10/22/2021 1047   GFRNONAA >60 12/21/2023 1103    IMAGING past 24 hours DG CHEST PORT 1 VIEW Result Date: 12/21/2023 CLINICAL DATA:  Fever. EXAM: PORTABLE CHEST 1 VIEW COMPARISON:  Dec 19, 2023. FINDINGS: Stable cardiomediastinal silhouette. Status post coronary bypass graft. Lungs are clear. Bony thorax is unremarkable. IMPRESSION: No active disease. Electronically Signed   By: Rosalene Colon M.D.   On: 12/21/2023 10:23    Vitals:   12/21/23 0025 12/21/23 0400 12/21/23 0746  12/21/23 1227  BP: (!) 182/88 (!) 172/87 (!) 154/65 (!) 143/75  Pulse: 69 62 69 67  Resp: 12 12 14 17   Temp: 99.8 F (37.7 C) 99.6 F (37.6 C)  99.3 F (37.4 C)  TempSrc: Oral Oral Oral Oral  SpO2: 94% 94% 96% 95%  Weight:         PHYSICAL EXAM General: Sleepy, appears acutely ill, NAD Psych:  Mood and affect appropriate for situation CV: Regular rate and rhythm on monitor Respiratory:  Regular, unlabored respirations on room air  NEURO:  Mental Status: Drowsy, Oriented to self, age, time. Slow to answer. Poor attention.  Speech/Language: No dysarthria or aphasia. Able to follow all simple commands.   Cranial Nerves:  II: PERRL. Left hemianopsia  III, IV, VI: EOMI with decreased tracking. Eyelids elevate symmetrically.  V: Sensation is subjectively intact to light touch and symmetrical to face.  VII: Left facial droop VIII: hearing intact to voice. IX, X: No dysarthria XII: tongue is midline without fasciculations. Motor: Spontaneous movement of all 4 extremities seen with antigravity strength.  There is mild drift present on the left side Tone: is normal and bulk is normal Sensation- Decreased to left arm   Coordination: FTN intact bilaterally, HKS: no ataxia in BLE.No drift.  Gait- deferred  Most Recent NIH: 4.     ASSESSMENT/PLAN  Tyler Guerra. is a 73 y.o. male  with hx  of hypertension, hyperlipidemia, TIA, prostate cancer, right ACA stroke in May 2024 and gout who presents with altered mental status, left-sided weakness, left-sided neglect and right gaze deviation and admitted for full TIA/stroke workup NIH on Admission: 11.  Acute Ischemic Infarct:  right parietal periventricular white matter  Etiology:  small vessel disease,   Code Stroke CT head  No acute finding Parenchymal atrophy, chronic small vessel disease, chronic infarcts (anterior left frontal lobe, right frontal lobe, right white matter) CTA head & neck  Focal acute occlusion versus severe,  near occlusive stenosis right A3 segment.  Additional severe stenosis within right A3 segment immediately downstream from this. Background intracranial atherosclerotic disease with calcified plaque resulting in up to moderate stenosis of paraclinoid right ICA CT perfusion  No core infarct, no mismatch volume reported MRI   3 mm acute right white matter infarct Background parenchymal atrophy, chronic small vessel disease, chronic infarcts rEEG: Suggestive of moderate diffuse encephalopathy.  No seizures seen. Carotid Doppler : PENDING 2D Echo 12/09/2023: EF 60 to 65%, mild LVH, trivial MVR, trivial AVR, mild to moderate AV stenosis Recommend Loop recorder interrogation to evaluate for atrial fibrillation as this will change anticoagulation recommendations LDL 51 HgbA1c 6.7 VTE prophylaxis -Lovenox  aspirin  81 mg daily and clopidogrel  75 mg daily prior to admission, continue Aspirin  and Brilinta  for 4 weeks, then Aspirin  only as DAPT has failed.  Therapy recommendations:  Pending Disposition:  pending  Hx of Stroke/TIA TIA 2017 12/2022: Right ACA territory infarct due to right A1 occlusion with exam notable for mild LLE weakness and ataxia.  Discharged with walker and outpatient therapy orders, aspirin  and Brilinta  for 4 weeks, then resumed his aspirin  and Plavix .   Hypertension Home meds: Losartan  25 mg daily Stable Blood Pressure Goal: BP less than 220/110 for 24 to 48 hours after symptom onset, then gradually normalize Avoid hypotension due to intracranial atherosclerosis  Hyperlipidemia Home meds: Atorvastatin  80 mg, resumed in hospital LDL 51, goal < 70 Continue statin at discharge  Diabetes type II Controlled Home meds:  none HgbA1c 6.7, goal < 7.0 CBGs SSI Recommend close follow-up with PCP for better DM control  Other Stroke Risk Factors Obesity, Body mass index is 30.18 kg/m., BMI >/= 30 associated with increased stroke risk, recommend weight loss, diet and exercise as  appropriate  Family hx stroke (father) Coronary artery disease S/p CABG   Other Active Problems Hx of Prostate Cancer  Hospital day # 2     Patient presented with altered mental status with left-sided weakness and right gaze deviation in the setting of febrile illness.  MRI shows only a small right parietal periventricular white matter lacunar infarct but patient has had multiple previous strokes.  Aspirin  and Brillinta for 4 weeks followed by aspirin  alone as patient has failed dual antiplatelet therapy with aspirin  and Plavix .  Continue Aggressive risk factor modification.  Patient has been accepted for inpatient rehab but no bed is available today.  Stroke team will sign off.  Follow-up with outpatient stroke clinic in 2 months.  Long discussion with patient and Dr. Kelleen Patee and answered questions..  Treatment of fever and possible infection as per primary team.  Greater than 50% time during this 35-minute visit was spent in counseling and coordination of care and discussion with patient and answering questions.  Ardella Beaver, MD Medical Director [facility] Pager: [phone] 12/21/2023 12:37 PM  Please use AMION for contact information & EPIC for messaging.  To contact Stroke Continuity provider, please  refer to [email or web address]. After hours, contact General Neurology

## 2023-12-21 NOTE — Progress Notes (Signed)
 Inpatient Rehab Coordinator Note:  I met with patient and his spouse, Tyler Guerra, at bedside to discuss CIR recommendations and goals/expectations of CIR stay.  We reviewed 3 hrs/day of therapy, physician follow up, and estimated length of stay 2 weeks or less (dependent upon progress) with goals of supervision.  She is home with pt and able to provide expected level of assist.  They also have 2 daughters who live in close proximity and are supportive.  We reviewed insurance benefits and confirmed Medicare A/B primary and BCBS supplement.  I do not need prior auth to admit to CIR.  I do not have a bed available for this patient to admit to CIR today, but will follow for potential admit in the next few days pending availability and progress with therapy.   [name], PT, DPT Admissions Coordinator [phone] 12/21/23  11:32 AM

## 2023-12-21 NOTE — Progress Notes (Addendum)
 [facility]                                                                               Tyler Guerra, is a 73 y.o. male, DOB - May 02, 1951, WUJ:[id number] Admit date - 12/19/2023    Outpatient Primary MD for the patient is Donnie Galea, MD  LOS - 2  days    Brief summary    Tyler Guerra. is a 73 y.o. 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  His workup significant for acute CVA   Assessment & Plan    Assessment and Plan:   Acute ischemic CVA -Right parietal periventricular white matter, most likely due to small vessel disease -CTA head with no acute findings CTA head and neck: Focal acute occlusion versus severe, near occlusive stenosis right A3 segment.  Additional severe stenosis within right A3 segment immediately downstream from this.Background intracranial atherosclerotic disease with calcified plaque resulting in up to moderate stenosis of paraclinoid right ICA - MRI brain with 3 mm acute right ventricular moderate infarct - EEG moderate diffuse encephalopathy, no seizures - 2D echo EF 60 to 65% mild to moderate AV stenosis - Loop recorder interrogated, discussed with EP, PA [place] reviewed device, device is clear, no evidence of A-fib.   - LDL 51 A1c 6.7 - On aspirin  and Plavix  prior to admission, recommendation for aspirin  and Brilinta  x 4 weeks then aspirin  only as dual antiplatelet therapy failed - PT, OT, SLP consult greatly appreciated, plan for CIR, pending bed availability  Per lipidemia - LDL is 51, continue with home atorvastatin  80 mg daily  Hypertension - Allow for permissive hypertension  Fever on admission -No clear etiology, no evidence of pneumonia, UA is negative, procalcitonin is  negative -Will monitor off antibiotics will DC Rocephin  and azithromycin   CAD Stable, no chest pain.   Hypertension Well controlled.  Low for permissive hypertension  Depression/ anxiety Resume home meds.   BPH Resume flomax .   Estimated body mass index is 30.18 kg/m as calculated from the following:   Height as of 11/30/23: 5\' 10"  (1.778 m).   Weight as of this encounter: 95.4 kg.  Code Status: full code.  DVT Prophylaxis:  enoxaparin  (LOVENOX ) injection 40 mg Start: 12/19/23 1700   Level of Care: Level of care: Telemetry Medical Family Communication: none at bedside.    Disposition Plan:     Remains inpatient appropriate:  pending further work up/ CIR work up.    Consultants:   Neurology.   Antimicrobials:   Anti-infectives (From admission, onward)    Start     Dose/Rate Route Frequency Ordered Stop   12/20/23 1900  azithromycin  (ZITHROMAX ) tablet 500 mg        500 mg Oral Daily 12/20/23 1801 12/23/23 0959   12/19/23 1730  cefTRIAXone  (ROCEPHIN ) 1 g in sodium chloride  0.9 % 100 mL IVPB        1 g 200 mL/hr over 30 Minutes Intravenous Every 24  hours 12/19/23 1724          Medications  Scheduled Meds:  allopurinol   300 mg Oral Daily   aspirin  EC  81 mg Oral Daily   atorvastatin   80 mg Oral Daily   azithromycin   500 mg Oral Daily   cholecalciferol   1,000 Units Oral Daily   enoxaparin  (LOVENOX ) injection  40 mg Subcutaneous Q24H   escitalopram   5 mg Oral Daily   pantoprazole   40 mg Oral Daily   tamsulosin   0.4 mg Oral Daily   ticagrelor   90 mg Oral BID   Continuous Infusions:  cefTRIAXone  (ROCEPHIN )  IV 1 g (12/20/23 1745)   PRN Meds:.acetaminophen  **OR** acetaminophen  (TYLENOL ) oral liquid 160 mg/5 mL **OR** acetaminophen , nitroGLYCERIN , polyethylene glycol powder, polyvinyl alcohol     Subjective:   Tyler Guerra does appear mildly confused, reports some headache still reports weakness in the left side Objective:   Vitals:   12/21/23 0025  12/21/23 0400 12/21/23 0746 12/21/23 1227  BP: (!) 182/88 (!) 172/87 (!) 154/65 (!) 143/75  Pulse: 69 62 69 67  Resp: 12 12 14 17   Temp: 99.8 F (37.7 C) 99.6 F (37.6 C)  99.3 F (37.4 C)  TempSrc: Oral Oral Oral Oral  SpO2: 94% 94% 96% 95%  Weight:        Intake/Output Summary (Last 24 hours) at 12/21/2023 1433 Last data filed at 12/21/2023 1228 Gross per 24 hour  Intake --  Output 760 ml  Net -760 ml   Filed Weights   12/19/23 1100  Weight: 95.4 kg     Exam Awake Alert, confused, left facial droop, left hemianopsia with left side mild drift and decreased strength in the left arm, he has poor attention, Symmetrical Chest wall movement, Good air movement bilaterally, CTAB RRR,No Gallops,Rubs or new Murmurs, No Parasternal Heave +ve B.Sounds, Abd Soft, No tenderness, No rebound - guarding or rigidity. No Cyanosis, Clubbing or edema, No new Rash or bruise     Data Reviewed:  I have personally reviewed following labs and imaging studies   CBC Lab Results  Component Value Date   WBC 7.1 12/21/2023   RBC 4.18 (L) 12/21/2023   HGB 12.5 (L) 12/21/2023   HCT 36.0 (L) 12/21/2023   MCV 86.1 12/21/2023   MCH 29.9 12/21/2023   PLT 150 12/21/2023   MCHC 34.7 12/21/2023   RDW 12.4 12/21/2023   LYMPHSABS 1.2 12/19/2023   MONOABS 0.3 12/19/2023   EOSABS 0.0 12/19/2023   BASOSABS 0.0 12/19/2023     Last metabolic panel Lab Results  Component Value Date   NA 138 12/21/2023   K 3.7 12/21/2023   CL 107 12/21/2023   CO2 22 12/21/2023   BUN 19 12/21/2023   CREATININE 1.03 12/21/2023   GLUCOSE 104 (H) 12/21/2023   GFRNONAA >60 12/21/2023   GFRAA >60 10/12/2019   CALCIUM  9.0 12/21/2023   PHOS 3.6 12/22/2022   PROT 7.0 12/19/2023   ALBUMIN 3.7 12/19/2023   BILITOT 1.6 (H) 12/19/2023   ALKPHOS 97 12/19/2023   AST 25 12/19/2023   ALT 16 12/19/2023   ANIONGAP 9 12/21/2023    CBG (last 3)  Recent Labs    12/19/23 1154 12/20/23 2350  GLUCAP 169* 85       Coagulation Profile: Recent Labs  Lab 12/19/23 1154  INR 1.2     Radiology Studies: DG CHEST PORT 1 VIEW Result Date: 12/21/2023 CLINICAL DATA:  Fever. EXAM: PORTABLE CHEST 1 VIEW COMPARISON:  Dec 19, 2023. FINDINGS: Stable  cardiomediastinal silhouette. Status post coronary bypass graft. Lungs are clear. Bony thorax is unremarkable. IMPRESSION: No active disease. Electronically Signed   By: Rosalene Colon M.D.   On: 12/21/2023 10:23   EEG adult Result Date: 12/19/2023 Arleene Lack, MD     12/19/2023  6:35 PM Patient Name: [name] MRN: [id number] Epilepsy Attending: Arleene Lack Referring Physician/Provider: Colon Dear, NP Date: 12/19/2023 Duration: 21.27 mins Patient history: 73yo M with altered mental status, left-sided weakness, left-sided neglect and right gaze deviation. EEG to evaluate for seizure Level of alertness: Awake AEDs during EEG study: None Technical aspects: This EEG study was done with scalp electrodes positioned according to the 10-20 International system of electrode placement. Electrical activity was reviewed with band pass filter of 1-70Hz , sensitivity of 7 uV/mm, display speed of 38mm/sec with a 60Hz  notched filter applied as appropriate. EEG data were recorded continuously and digitally stored.  Video monitoring was available and reviewed as appropriate. Description: EEG showed continuous generalized 3 to 6 Hz theta-delta slowing. Hyperventilation and photic stimulation were not performed.   ABNORMALITY - Continuous slow, generalized IMPRESSION: This study is suggestive of moderate diffuse encephalopathy. No seizures or epileptiform discharges were seen throughout the recording. Priyanka O Yadav       Lavaris Sexson M.D. [facility] 12/21/2023, 2:33 PM  Available via Epic secure chat 7am-7pm After 7 pm, please refer to night coverage provider listed on amion.

## 2023-12-22 ENCOUNTER — Other Ambulatory Visit: Payer: Self-pay

## 2023-12-22 ENCOUNTER — Encounter (HOSPITAL_COMMUNITY)

## 2023-12-22 ENCOUNTER — Inpatient Hospital Stay (HOSPITAL_COMMUNITY)
Admission: AD | Admit: 2023-12-22 | Discharge: 2023-12-30 | DRG: 057 | Disposition: A | Discharge status: Home / Self Care | Source: Intra-hospital | Attending: Physical Medicine and Rehabilitation | Admitting: Physical Medicine and Rehabilitation

## 2023-12-22 ENCOUNTER — Encounter (HOSPITAL_COMMUNITY): Payer: Self-pay | Admitting: Physical Medicine and Rehabilitation

## 2023-12-22 DIAGNOSIS — I2510 Atherosclerotic heart disease of native coronary artery without angina pectoris: Secondary | ICD-10-CM | POA: Diagnosis present

## 2023-12-22 DIAGNOSIS — E66811 Obesity, class 1: Secondary | ICD-10-CM | POA: Diagnosis present

## 2023-12-22 DIAGNOSIS — R4189 Other symptoms and signs involving cognitive functions and awareness: Secondary | ICD-10-CM | POA: Diagnosis present

## 2023-12-22 DIAGNOSIS — I69392 Facial weakness following cerebral infarction: Secondary | ICD-10-CM

## 2023-12-22 DIAGNOSIS — G479 Sleep disorder, unspecified: Secondary | ICD-10-CM | POA: Diagnosis present

## 2023-12-22 DIAGNOSIS — R4587 Impulsiveness: Secondary | ICD-10-CM | POA: Diagnosis present

## 2023-12-22 DIAGNOSIS — R3911 Hesitancy of micturition: Secondary | ICD-10-CM | POA: Diagnosis present

## 2023-12-22 DIAGNOSIS — F411 Generalized anxiety disorder: Secondary | ICD-10-CM | POA: Diagnosis present

## 2023-12-22 DIAGNOSIS — H9190 Unspecified hearing loss, unspecified ear: Secondary | ICD-10-CM | POA: Diagnosis present

## 2023-12-22 DIAGNOSIS — K219 Gastro-esophageal reflux disease without esophagitis: Secondary | ICD-10-CM | POA: Diagnosis present

## 2023-12-22 DIAGNOSIS — R7989 Other specified abnormal findings of blood chemistry: Secondary | ICD-10-CM | POA: Diagnosis present

## 2023-12-22 DIAGNOSIS — E872 Acidosis, unspecified: Secondary | ICD-10-CM | POA: Diagnosis present

## 2023-12-22 DIAGNOSIS — I69354 Hemiplegia and hemiparesis following cerebral infarction affecting left non-dominant side: Principal | ICD-10-CM

## 2023-12-22 DIAGNOSIS — E785 Hyperlipidemia, unspecified: Secondary | ICD-10-CM | POA: Diagnosis present

## 2023-12-22 DIAGNOSIS — Z79899 Other long term (current) drug therapy: Secondary | ICD-10-CM

## 2023-12-22 DIAGNOSIS — E876 Hypokalemia: Secondary | ICD-10-CM | POA: Diagnosis present

## 2023-12-22 DIAGNOSIS — Z951 Presence of aortocoronary bypass graft: Secondary | ICD-10-CM

## 2023-12-22 DIAGNOSIS — Z6829 Body mass index (BMI) 29.0-29.9, adult: Secondary | ICD-10-CM

## 2023-12-22 DIAGNOSIS — F32A Depression, unspecified: Secondary | ICD-10-CM | POA: Diagnosis present

## 2023-12-22 DIAGNOSIS — K5900 Constipation, unspecified: Secondary | ICD-10-CM | POA: Diagnosis present

## 2023-12-22 DIAGNOSIS — I10 Essential (primary) hypertension: Secondary | ICD-10-CM | POA: Diagnosis present

## 2023-12-22 DIAGNOSIS — Z823 Family history of stroke: Secondary | ICD-10-CM

## 2023-12-22 DIAGNOSIS — Z87440 Personal history of urinary (tract) infections: Secondary | ICD-10-CM | POA: Diagnosis not present

## 2023-12-22 DIAGNOSIS — C61 Malignant neoplasm of prostate: Secondary | ICD-10-CM

## 2023-12-22 DIAGNOSIS — I672 Cerebral atherosclerosis: Secondary | ICD-10-CM | POA: Diagnosis present

## 2023-12-22 DIAGNOSIS — Z7982 Long term (current) use of aspirin: Secondary | ICD-10-CM

## 2023-12-22 DIAGNOSIS — N401 Enlarged prostate with lower urinary tract symptoms: Secondary | ICD-10-CM | POA: Diagnosis not present

## 2023-12-22 DIAGNOSIS — I6523 Occlusion and stenosis of bilateral carotid arteries: Secondary | ICD-10-CM | POA: Diagnosis present

## 2023-12-22 DIAGNOSIS — R739 Hyperglycemia, unspecified: Secondary | ICD-10-CM | POA: Diagnosis not present

## 2023-12-22 DIAGNOSIS — F39 Unspecified mood [affective] disorder: Secondary | ICD-10-CM

## 2023-12-22 DIAGNOSIS — M109 Gout, unspecified: Secondary | ICD-10-CM | POA: Diagnosis present

## 2023-12-22 DIAGNOSIS — I350 Nonrheumatic aortic (valve) stenosis: Secondary | ICD-10-CM | POA: Diagnosis present

## 2023-12-22 DIAGNOSIS — Z923 Personal history of irradiation: Secondary | ICD-10-CM

## 2023-12-22 DIAGNOSIS — Z955 Presence of coronary angioplasty implant and graft: Secondary | ICD-10-CM

## 2023-12-22 DIAGNOSIS — Z8042 Family history of malignant neoplasm of prostate: Secondary | ICD-10-CM

## 2023-12-22 DIAGNOSIS — Z818 Family history of other mental and behavioral disorders: Secondary | ICD-10-CM

## 2023-12-22 DIAGNOSIS — Z7902 Long term (current) use of antithrombotics/antiplatelets: Secondary | ICD-10-CM

## 2023-12-22 DIAGNOSIS — Z800 Family history of malignant neoplasm of digestive organs: Secondary | ICD-10-CM

## 2023-12-22 DIAGNOSIS — R509 Fever, unspecified: Secondary | ICD-10-CM | POA: Diagnosis not present

## 2023-12-22 DIAGNOSIS — I639 Cerebral infarction, unspecified: Secondary | ICD-10-CM | POA: Diagnosis not present

## 2023-12-22 DIAGNOSIS — Z8546 Personal history of malignant neoplasm of prostate: Secondary | ICD-10-CM

## 2023-12-22 DIAGNOSIS — E119 Type 2 diabetes mellitus without complications: Secondary | ICD-10-CM | POA: Diagnosis present

## 2023-12-22 DIAGNOSIS — I252 Old myocardial infarction: Secondary | ICD-10-CM

## 2023-12-22 DIAGNOSIS — R410 Disorientation, unspecified: Secondary | ICD-10-CM | POA: Insufficient documentation

## 2023-12-22 DIAGNOSIS — Z8249 Family history of ischemic heart disease and other diseases of the circulatory system: Secondary | ICD-10-CM

## 2023-12-22 LAB — GLUCOSE, CAPILLARY
Glucose-Capillary: 113 mg/dL — ABNORMAL HIGH (ref 70–99)
Glucose-Capillary: 125 mg/dL — ABNORMAL HIGH (ref 70–99)

## 2023-12-22 MED ORDER — VITAMIN D 25 MCG (1000 UNIT) PO TABS
1000.0000 [IU] | ORAL_TABLET | Freq: Every day | ORAL | Status: DC
  Administered 2023-12-23 – 2023-12-30 (×8): 1000 [IU] via ORAL
  Filled 2023-12-22 (×8): qty 1

## 2023-12-22 MED ORDER — POLYETHYLENE GLYCOL 3350 17 GM/SCOOP PO POWD: 17.0000 g | Freq: Every day | ORAL | Status: DC | PRN

## 2023-12-22 MED ORDER — POLYVINYL ALCOHOL 1.4 % OP SOLN
1.0000 [drp] | Freq: Every day | OPHTHALMIC | Status: DC | PRN
Start: 2023-12-22 — End: 2023-12-30

## 2023-12-22 MED ORDER — ALLOPURINOL 300 MG PO TABS
300.0000 mg | ORAL_TABLET | Freq: Every day | ORAL | Status: DC
  Administered 2023-12-23 – 2023-12-30 (×8): 300 mg via ORAL
  Filled 2023-12-22 (×8): qty 1

## 2023-12-22 MED ORDER — GUAIFENESIN-DM 100-10 MG/5ML PO SYRP: 5.0000 mL | ORAL_SOLUTION | Freq: Four times a day (QID) | ORAL | Status: DC | PRN

## 2023-12-22 MED ORDER — NITROGLYCERIN 0.4 MG SL SUBL: 0.4000 mg | SUBLINGUAL_TABLET | SUBLINGUAL | Status: DC | PRN

## 2023-12-22 MED ORDER — PROPYLENE GLYCOL 0.6 % OP SOLN: 1.0000 [drp] | Freq: Every day | OPHTHALMIC | Status: DC | PRN

## 2023-12-22 MED ORDER — LIDOCAINE HCL URETHRAL/MUCOSAL 2 % EX GEL: CUTANEOUS | Status: DC | PRN

## 2023-12-22 MED ORDER — MELATONIN 5 MG PO TABS
5.0000 mg | ORAL_TABLET | Freq: Every evening | ORAL | Status: DC | PRN
  Administered 2023-12-29: 5 mg via ORAL
  Filled 2023-12-22: qty 1

## 2023-12-22 MED ORDER — ASPIRIN 81 MG PO TBEC
81.0000 mg | DELAYED_RELEASE_TABLET | Freq: Every day | ORAL | Status: DC
  Administered 2023-12-23 – 2023-12-30 (×8): 81 mg via ORAL
  Filled 2023-12-22 (×8): qty 1

## 2023-12-22 MED ORDER — SALINE SPRAY 0.65 % NA SOLN: 1.0000 | NASAL | Status: DC | PRN

## 2023-12-22 MED ORDER — PROCHLORPERAZINE EDISYLATE 10 MG/2ML IJ SOLN: 5.0000 mg | Freq: Four times a day (QID) | INTRAMUSCULAR | Status: DC | PRN

## 2023-12-22 MED ORDER — TICAGRELOR 90 MG PO TABS
90.0000 mg | ORAL_TABLET | Freq: Two times a day (BID) | ORAL | Status: DC
  Administered 2023-12-22 – 2023-12-30 (×16): 90 mg via ORAL
  Filled 2023-12-22 (×16): qty 1

## 2023-12-22 MED ORDER — INSULIN ASPART 100 UNIT/ML IJ SOLN: 0.0000 [IU] | Freq: Every day | INTRAMUSCULAR | Status: DC

## 2023-12-22 MED ORDER — LOSARTAN POTASSIUM 25 MG PO TABS
25.0000 mg | ORAL_TABLET | Freq: Every day | ORAL | Status: DC
  Administered 2023-12-23 – 2023-12-30 (×8): 25 mg via ORAL
  Filled 2023-12-22 (×8): qty 1

## 2023-12-22 MED ORDER — DIPHENHYDRAMINE HCL 25 MG PO CAPS: 25.0000 mg | ORAL_CAPSULE | Freq: Four times a day (QID) | ORAL | Status: DC | PRN

## 2023-12-22 MED ORDER — QUETIAPINE FUMARATE 25 MG PO TABS
25.0000 mg | ORAL_TABLET | Freq: Every day | ORAL | Status: DC
  Administered 2023-12-22 – 2023-12-29 (×8): 25 mg via ORAL
  Filled 2023-12-22 (×8): qty 1

## 2023-12-22 MED ORDER — PANTOPRAZOLE SODIUM 40 MG PO TBEC
40.0000 mg | DELAYED_RELEASE_TABLET | Freq: Every day | ORAL | Status: DC
  Administered 2023-12-23 – 2023-12-30 (×8): 40 mg via ORAL
  Filled 2023-12-22 (×8): qty 1

## 2023-12-22 MED ORDER — INSULIN ASPART 100 UNIT/ML IJ SOLN
0.0000 [IU] | Freq: Three times a day (TID) | INTRAMUSCULAR | Status: DC
  Administered 2023-12-24: 1 [IU] via SUBCUTANEOUS
  Administered 2023-12-24: 2 [IU] via SUBCUTANEOUS
  Administered 2023-12-25: 1 [IU] via SUBCUTANEOUS

## 2023-12-22 MED ORDER — TAMSULOSIN HCL 0.4 MG PO CAPS
0.4000 mg | ORAL_CAPSULE | Freq: Every day | ORAL | Status: DC
  Administered 2023-12-23 – 2023-12-29 (×7): 0.4 mg via ORAL
  Filled 2023-12-22 (×7): qty 1

## 2023-12-22 MED ORDER — LOSARTAN POTASSIUM 50 MG PO TABS
25.0000 mg | ORAL_TABLET | Freq: Every day | ORAL | Status: DC
  Administered 2023-12-22: 25 mg via ORAL
  Filled 2023-12-22: qty 1

## 2023-12-22 MED ORDER — BISACODYL 10 MG RE SUPP: 10.0000 mg | Freq: Every day | RECTAL | Status: DC | PRN

## 2023-12-22 MED ORDER — TICAGRELOR 90 MG PO TABS: 90.0000 mg | ORAL_TABLET | Freq: Two times a day (BID) | ORAL | Status: DC

## 2023-12-22 MED ORDER — ALUM & MAG HYDROXIDE-SIMETH 200-200-20 MG/5ML PO SUSP: 30.0000 mL | ORAL | Status: DC | PRN

## 2023-12-22 MED ORDER — ENOXAPARIN SODIUM 40 MG/0.4ML IJ SOSY
40.0000 mg | PREFILLED_SYRINGE | INTRAMUSCULAR | Status: DC
  Administered 2023-12-22 – 2023-12-29 (×8): 40 mg via SUBCUTANEOUS
  Filled 2023-12-22 (×9): qty 0.4

## 2023-12-22 MED ORDER — ESCITALOPRAM OXALATE 10 MG PO TABS
5.0000 mg | ORAL_TABLET | Freq: Every day | ORAL | Status: DC
  Administered 2023-12-23 – 2023-12-30 (×8): 5 mg via ORAL
  Filled 2023-12-22 (×8): qty 1

## 2023-12-22 MED ORDER — CALCIUM POLYCARBOPHIL 625 MG PO TABS
625.0000 mg | ORAL_TABLET | Freq: Every day | ORAL | Status: DC
Start: 2023-12-22 — End: 2023-12-30
  Administered 2023-12-22 – 2023-12-30 (×9): 625 mg via ORAL
  Filled 2023-12-22 (×9): qty 1

## 2023-12-22 MED ORDER — FLEET ENEMA RE ENEM: 1.0000 | ENEMA | Freq: Once | RECTAL | Status: DC | PRN

## 2023-12-22 MED ORDER — ACETAMINOPHEN 325 MG PO TABS
325.0000 mg | ORAL_TABLET | ORAL | Status: DC | PRN
  Filled 2023-12-22: qty 2

## 2023-12-22 MED ORDER — PROCHLORPERAZINE MALEATE 5 MG PO TABS: 5.0000 mg | ORAL_TABLET | Freq: Four times a day (QID) | ORAL | Status: DC | PRN

## 2023-12-22 MED ORDER — PROCHLORPERAZINE 25 MG RE SUPP: 12.5000 mg | Freq: Four times a day (QID) | RECTAL | Status: DC | PRN

## 2023-12-22 MED ORDER — ATORVASTATIN CALCIUM 80 MG PO TABS
80.0000 mg | ORAL_TABLET | Freq: Every day | ORAL | Status: DC
  Administered 2023-12-23 – 2023-12-29 (×7): 80 mg via ORAL
  Filled 2023-12-22 (×7): qty 1

## 2023-12-22 NOTE — TOC Transition Note (Signed)
 Transition of Care [facility]) - Discharge Note   Patient Details  Name: Tyler Guerra. MRN: [id number] Date of Birth: 1950/09/13  [facility]) CM/SW Contact:  Jannice Mends, LCSW Phone Number: 12/22/2023, 10:29 AM   Clinical Narrative:    Patient discharging to inpatient rehab today. No further TOC needs identified.    Final next level of care: IP Rehab Facility Barriers to Discharge: No Barriers Identified   Patient Goals and CMS Choice            Discharge Placement                       Discharge Plan and Services Additional resources added to the After Visit Summary for                                       Social Drivers of Health (SDOH) Interventions SDOH Screenings   Food Insecurity: No Food Insecurity (12/20/2023)  Housing: Low Risk  (12/20/2023)  Transportation Needs: No Transportation Needs (12/20/2023)  Utilities: Not At Risk (12/20/2023)  Alcohol  Screen: Low Risk  (02/16/2023)  Depression (PHQ2-9): Low Risk  (08/02/2023)  Financial Resource Strain: Low Risk  (08/02/2023)  Physical Activity: Insufficiently Active (08/02/2023)  Social Connections: Moderately Isolated (12/20/2023)  Stress: No Stress Concern Present (08/02/2023)  Tobacco Use: Low Risk  (11/30/2023)  Health Literacy: Adequate Health Literacy (02/16/2023)     Readmission Risk Interventions     No data to display

## 2023-12-22 NOTE — Progress Notes (Signed)
 Occupational Therapy Treatment Patient Details Name: Tyler Guerra. MRN: [id number] DOB: 1951-07-12 Today's Date: 12/22/2023   History of present illness Tyler Guerra. is a 73 y.o. male admitted 12/19/23 with left-sided weakness and right gaze preference, migraine. MRI revealed acute infarct within the right periatrial white matter. PMH includes hypertension, hyperlipidemia, TIA, prostate cancer, right ACA stroke in 5/24, gout, CAD, TIA, anxiety, BPPV, CABG and stents, HOH (L better than R)   OT comments  Pt is progressing towards OT goals this session. Much more alert than previous session/evaluation - impulsive and with decreased safety awareness. Pt CGA for step pivot to recliner to eat lunch. Pt proceeded to eat all his his lunch, using utensils appropriately and practicing compensatory strategies for decreased vision. Able to locate all foods and utensils. Pt would benefit from additional visual assessment as impact seems less today than initial eval. POC remains appropriate and Pt will benefit from skilled OT acutely and post-acute at the >3 hour rehab venue with focus on cognition, vision, and functional ADL.      If plan is discharge home, recommend the following:  A little help with walking and/or transfers;A lot of help with bathing/dressing/bathroom;Assistance with cooking/housework;Direct supervision/assist for medications management;Direct supervision/assist for financial management;Assist for transportation;Help with stairs or ramp for entrance;Supervision due to cognitive status   Equipment Recommendations  BSC/3in1;Other (comment)    Recommendations for Other Services Rehab consult;PT consult;Speech consult    Precautions / Restrictions Precautions Precautions: Fall Recall of Precautions/Restrictions: Impaired Restrictions Weight Bearing Restrictions Per Provider Order: No       Mobility Bed Mobility Overal bed mobility: Needs Assistance Bed Mobility: Supine to  Sit     Supine to sit: Contact guard     General bed mobility comments: impulsive and required cues for sequencing and safety    Transfers Overall transfer level: Needs assistance Equipment used: 1 person hand held assist Transfers: Bed to chair/wheelchair/BSC       Step pivot transfers: Contact guard assist     General transfer comment: GCA required cues to find chair on the left, impulsive and standing unsafely initially for line management     Balance Overall balance assessment: Needs assistance Sitting-balance support: Feet supported, Single extremity supported Sitting balance-Leahy Scale: Good     Standing balance support: No upper extremity supported Standing balance-Leahy Scale: Fair                             ADL either performed or assessed with clinical judgement   ADL Overall ADL's : Needs assistance/impaired Eating/Feeding: Modified independent;Sitting                       Toilet Transfer: Contact guard assist;Stand-pivot (no DME) Toilet Transfer Details (indicate cue type and reason): simulated through recliner transfer         Functional mobility during ADLs: Contact guard assist (SPT only) General ADL Comments: continues to be very impulsive, mild L inattention, vision needs to be reassessed    Extremity/Trunk Assessment              Vision       Perception     Praxis     Communication Communication Communication: Other (comment) Factors Affecting Communication: Hearing impaired (L ear better than R ear)   Cognition Arousal: Alert Behavior During Therapy: Impulsive Cognition: Cognition impaired     Awareness: Intellectual awareness impaired, Online awareness impaired Memory  impairment (select all impairments): Short-term memory, Working memory Attention impairment (select first level of impairment): Sustained attention Executive functioning impairment (select all impairments): Organization, Reasoning,  Problem solving OT - Cognition Comments: Pt with L inattention, delayed response/initiation, Pt pleasant and cooperative, decreased attention to task - will continue to assess                 Following commands: Impaired Following commands impaired: Follows multi-step commands inconsistently      Cueing   Cueing Techniques: Verbal cues, Gestural cues, Tactile cues  Exercises      Shoulder Instructions       General Comments Wife "Trevor Fudge" present and supportive throughout session    Pertinent Vitals/ Pain       Pain Assessment Pain Assessment: No/denies pain Pain Intervention(s): Monitored during session, Repositioned  Home Living                                          Prior Functioning/Environment              Frequency  Min 2X/week        Progress Toward Goals  OT Goals(current goals can now be found in the care plan section)  Progress towards OT goals: Progressing toward goals  Acute Rehab OT Goals Patient Stated Goal: get to rehab and get home OT Goal Formulation: With patient/family Time For Goal Achievement: 01/03/24 Potential to Achieve Goals: Good  Plan      Co-evaluation                 AM-PAC OT "6 Clicks" Daily Activity     Outcome Measure   Help from another person eating meals?: None Help from another person taking care of personal grooming?: A Little Help from another person toileting, which includes using toliet, bedpan, or urinal?: A Little Help from another person bathing (including washing, rinsing, drying)?: A Little Help from another person to put on and taking off regular upper body clothing?: A Little Help from another person to put on and taking off regular lower body clothing?: A Little 6 Click Score: 19    End of Session Equipment Utilized During Treatment: Gait belt  OT Visit Diagnosis: Unsteadiness on feet (R26.81);Other abnormalities of gait and mobility (R26.89);Muscle weakness  (generalized) (M62.81);Low vision, both eyes (H54.2);Other symptoms and signs involving the nervous system (R29.898);Other symptoms and signs involving cognitive function;Hemiplegia and hemiparesis Hemiplegia - Right/Left: Left Hemiplegia - dominant/non-dominant: Non-Dominant Hemiplegia - caused by: Cerebral infarction   Activity Tolerance Patient tolerated treatment well   Patient Left in chair;with call bell/phone within reach;with family/visitor present   Nurse Communication Mobility status;Precautions        Time: 1610-9604 OT Time Calculation (min): 29 min  Charges: OT General Charges $OT Visit: 1 Visit OT Treatments $Self Care/Home Management : 8-22 mins $Therapeutic Activity: 8-22 mins  Chales [place] OTR/L Acute Rehabilitation Services Office: [phone]  Ebony Goldstein [facility] 12/22/2023, 1:14 PM

## 2023-12-22 NOTE — Progress Notes (Signed)
 Inpatient Rehab Admissions Coordinator:   I have a bed for this patient to admit to CIR today.  Dr. Hilton Lucky in agreement and [facility] aware.  I will let pt/family know.   [name], PT, DPT Admissions Coordinator [phone] 12/22/23  10:01 AM

## 2023-12-22 NOTE — Care Management Important Message (Signed)
 Important Message  Patient Details  Name: Tyler Guerra. MRN: [id number] Date of Birth: [birth year]   Important Message Given:  Yes - Medicare IM     Wynonia Hedges 12/22/2023, 3:03 PM

## 2023-12-22 NOTE — Discharge Summary (Signed)
 PATIENT DETAILS Name: Tyler Guerra. Age: 73 y.o. Sex: male Date of Birth: [birth year] MRN: [id number]. Admitting Physician: Michela Aguas, MD FAO:[id number], Gwynda Leriche, MD  Admit Date: 12/19/2023 Discharge date: 12/22/2023  Recommendations for Outpatient Follow-up:  Follow up with PCP in 1-2 weeks Please obtain CMP/CBC in one week Ensure follow-up with stroke clinic  Admitted From:  Home  Disposition: Rehabilitation facility   Discharge Condition: good  CODE STATUS:   Code Status: Full Code   Diet recommendation:  Diet Order             Diet - low sodium heart healthy           Diet Heart Room service appropriate? Yes; Fluid consistency: Thin  Diet effective now                    Brief Summary: 73 year old with history of HTN, HLD, CAD, TIA/CVA who presented with worsening left-sided weakness.  He was found to have acute CVA  Significant studies 4/29>> A1c: 6.7 5/08>> echo: EF 60-65% 5/18>> MRI brain: Acute infarct right periatrial white matter. 5/18>> CTA head/neck: No significant stenosis in neck-focal occlusion of right anterior cerebral artery-A3 segment. 5/19>> LDL: 51  Brief Hospital Course: Acute CVA Continues to have slight left-sided weakness-mostly leg Workup as above Per prior notes-Loop recorder interrogated by EP-no evidence of A-fib Followed closely by stroke MD-recommendations are for aspirin /Brilinta  x 4 weeks then aspirin  alone as patient failed DAPT with aspirin /Plavix . Plans are for CIR on discharge when bed available.  HLD Statin  HTN Initially permissive hypertension was allowed Resume losartan /metoprolol    Fever on admission No source of infection identified Afebrile now-monitor off antibiotics Unclear significance of fever-as isolated and with no recurrence.  Mood disorder Stable Lexapro .  Class 1 Obesity: Estimated body mass index is 30.18 kg/m as calculated from the following:   Height as of 11/30/23: 5\' 10"   (1.778 m).   Weight as of this encounter: 95.4 kg.    Discharge Diagnoses:  Principal Problem:   Acute CVA (cerebrovascular accident) [facility])   Discharge Instructions:  Activity:  As tolerated  Discharge Instructions     Ambulatory referral to Neurology   Complete by: As directed    An appointment is requested in approximately: 4 weeks   Diet - low sodium heart healthy   Complete by: As directed    Discharge instructions   Complete by: As directed    Follow with Primary MD  Donnie Galea, MD in 1-2 weeks  Follow-up with stroke clinic-their office will call you with a follow-up appointment  Take aspirin  along with Brilinta  x 4 weeks-after 4 weeks-stop Brilinta  and continue on aspirin .  Please get a complete blood count and chemistry panel checked by your Primary MD at your next visit, and again as instructed by your Primary MD.  Get Medicines reviewed and adjusted: Please take all your medications with you for your next visit with your Primary MD  Laboratory/radiological data: Please request your Primary MD to go over all hospital tests and procedure/radiological results at the follow up, please ask your Primary MD to get all Hospital records sent to his/her office.  In some cases, they will be blood work, cultures and biopsy results pending at the time of your discharge. Please request that your primary care M.D. follows up on these results.  Also Note the following: If you experience worsening of your admission symptoms, develop shortness of breath, life threatening emergency, suicidal or homicidal  thoughts you must seek medical attention immediately by calling 911 or calling your MD immediately  if symptoms less severe.  You must read complete instructions/literature along with all the possible adverse reactions/side effects for all the Medicines you take and that have been prescribed to you. Take any new Medicines after you have completely understood and accpet all the  possible adverse reactions/side effects.   Do not drive when taking Pain medications or sleeping medications (Benzodaizepines)  Do not take more than prescribed Pain, Sleep and Anxiety Medications. It is not advisable to combine anxiety,sleep and pain medications without talking with your primary care practitioner  Special Instructions: If you have smoked or chewed Tobacco  in the last 2 yrs please stop smoking, stop any regular Alcohol   and or any Recreational drug use.  Wear Seat belts while driving.  Please note: You were cared for by a hospitalist during your hospital stay. Once you are discharged, your primary care physician will handle any further medical issues. Please note that NO REFILLS for any discharge medications will be authorized once you are discharged, as it is imperative that you return to your primary care physician (or establish a relationship with a primary care physician if you do not have one) for your post hospital discharge needs so that they can reassess your need for medications and monitor your lab values.   Increase activity slowly   Complete by: As directed       Allergies as of 12/22/2023   No Known Allergies      Medication List     STOP taking these medications    clopidogrel  75 MG tablet Commonly known as: PLAVIX        TAKE these medications    allopurinol  300 MG tablet Commonly known as: ZYLOPRIM  Take 1 tablet (300 mg total) by mouth daily.   aspirin  EC 81 MG tablet Take 1 tablet (81 mg total) by mouth daily.   atorvastatin  80 MG tablet Commonly known as: LIPITOR  Take 1 tablet (80 mg total) by mouth daily. What changed: when to take this   BLACK CHERRY CONCENTRATE PO Take 1 tablet by mouth at bedtime.   escitalopram  5 MG tablet Commonly known as: LEXAPRO  Take 1 tablet (5 mg total) by mouth daily.   losartan  25 MG tablet Commonly known as: COZAAR  TAKE 1 TABLET DAILY What changed: how much to take   metoprolol  tartrate 25 MG  tablet Commonly known as: LOPRESSOR  Take 0.5 tablets (12.5 mg total) by mouth 2 (two) times daily.   nitroGLYCERIN  0.4 MG SL tablet Commonly known as: NITROSTAT  Place 1 tablet (0.4 mg total) under the tongue every 5 (five) minutes as needed for chest pain.   OCUVITE PO Take 1 capsule by mouth daily.   pantoprazole  40 MG tablet Commonly known as: PROTONIX  Take 1 tablet (40 mg total) by mouth daily.   polyethylene glycol powder 17 GM/SCOOP powder Commonly known as: GLYCOLAX /MIRALAX  Take 17 g by mouth daily as needed for mild constipation.   sodium chloride  0.65 % Soln nasal spray Commonly known as: OCEAN Place 1 spray into both nostrils as needed for congestion.   Systane Balance 0.6 % Soln Generic drug: Propylene Glycol Place 1 drop into both eyes daily as needed (dry eyes).   tamsulosin  0.4 MG Caps capsule Commonly known as: FLOMAX  Take 1 capsule (0.4 mg total) by mouth daily.   ticagrelor  90 MG Tabs tablet Commonly known as: BRILINTA  Take 1 tablet (90 mg total) by mouth 2 (two) times  daily for 28 days.   Vitamin A 3 MG Caps Take by mouth.   VITAMIN D  PO Take 1 capsule by mouth daily.        Follow-up Information     Donnie Galea, MD. Schedule an appointment as soon as possible for a visit in 1 week(s).   Specialty: Family Medicine Contact information: [address] [place] [address] [phone]         [facility] Follow up.   Specialty: Neurology Why: Office will call with date/time, If you dont hear from them,please give them a call Contact information: [address] [place]  [phone]               No Known Allergies   Other Procedures/Studies: DG CHEST PORT 1 VIEW Result Date: 12/21/2023 CLINICAL DATA:  Fever. EXAM: PORTABLE CHEST 1 VIEW COMPARISON:  Dec 19, 2023. FINDINGS: Stable cardiomediastinal silhouette. Status post coronary bypass graft.  Lungs are clear. Bony thorax is unremarkable. IMPRESSION: No active disease. Electronically Signed   By: Rosalene Colon M.D.   On: 12/21/2023 10:23   EEG adult Result Date: 12/19/2023 Arleene Lack, MD     12/19/2023  6:35 PM Patient Name: [name] MRN: [id number] Epilepsy Attending: Arleene Lack Referring Physician/Provider: Colon Dear, NP Date: 12/19/2023 Duration: 21.27 mins Patient history: 73yo M with altered mental status, left-sided weakness, left-sided neglect and right gaze deviation. EEG to evaluate for seizure Level of alertness: Awake AEDs during EEG study: None Technical aspects: This EEG study was done with scalp electrodes positioned according to the 10-20 International system of electrode placement. Electrical activity was reviewed with band pass filter of 1-70Hz , sensitivity of 7 uV/mm, display speed of 61mm/sec with a 60Hz  notched filter applied as appropriate. EEG data were recorded continuously and digitally stored.  Video monitoring was available and reviewed as appropriate. Description: EEG showed continuous generalized 3 to 6 Hz theta-delta slowing. Hyperventilation and photic stimulation were not performed.   ABNORMALITY - Continuous slow, generalized IMPRESSION: This study is suggestive of moderate diffuse encephalopathy. No seizures or epileptiform discharges were seen throughout the recording. Arleene Lack   MR BRAIN WO CONTRAST Result Date: 12/19/2023 CLINICAL DATA:  Provided history: Stroke, follow-up. EXAM: MRI HEAD WITHOUT CONTRAST TECHNIQUE: Multiplanar, multiecho pulse sequences of the brain and surrounding structures were obtained without intravenous contrast. COMPARISON:  Non-contrast head CT and CT angiogram head/neck performed earlier today 12/19/2023. Brain MRI 12/21/2022. FINDINGS: Brain: Generalized cerebral atrophy. 3 mm acute infarct within the right periatrial white matter (series 9, image 53). Known chronic right anterior cerebral  artery territory infarct within the medial right frontal lobe and corpus callosum. Adjacent chronic infarct within the right frontal lobe periventricular white matter, extending inferiorly to the right basal ganglia. Small chronic cortical infarct within the anterior left frontal lobe. Chronic lacunar infarcts within the cerebral hemispheric white matter on the left. Additional small chronic lacunar infarct within the left subinsular white matter. Background advanced patchy and confluent T2 FLAIR hyperintense signal abnormality within the cerebral white matter, nonspecific but compatible with chronic small vessel ischemic disease. Few punctate chronic microhemorrhages scattered within the supratentorial brain. No evidence of an intracranial mass. No extra-axial fluid collection. No midline shift. Vascular: Please see CTA head/neck performed earlier today. Skull and upper cervical spine: No focal worrisome marrow lesion. Slight C2-C3 grade 1 anterolisthesis. Sinuses/Orbits: No mass or acute finding within the  imaged orbits. Prior left ocular lens replacement. No significant paranasal sinus disease. IMPRESSION: 1. 3 mm acute infarct within the right periatrial white matter. 2. Background parenchymal atrophy, chronic small vessel disease and redemonstrated chronic infarcts as described. Electronically Signed   By: Bascom Lily D.O.   On: 12/19/2023 14:46   DG Chest Port 1 View Result Date: 12/19/2023 CLINICAL DATA:  Fever.  Neurologic deficits. EXAM: PORTABLE CHEST 1 VIEW COMPARISON:  AP chest 05/22/2021 FINDINGS: Status post median sternotomy and CABG. Cardiac silhouette is at the upper limits of normal size. Mediastinal contours are within limits. Interval decrease in now moderately low lung volumes. No definite acute airspace opacity. No pleural effusion or pneumothorax. No acute skeletal abnormality. IMPRESSION: Interval decrease in now moderately low lung volumes. No definite acute airspace opacity.  Electronically Signed   By: Bertina Broccoli M.D.   On: 12/19/2023 13:46   CT ANGIO HEAD NECK W WO CM W PERF (CODE STROKE) Result Date: 12/19/2023 CLINICAL DATA:  Provided history: Neuro deficit, acute, stroke suspected. EXAM: CT ANGIOGRAPHY HEAD AND NECK CT PERFUSION BRAIN TECHNIQUE: Multidetector CT imaging of the head and neck was performed using the standard protocol during bolus administration of intravenous contrast. Multiplanar CT image reconstructions and MIPs were obtained to evaluate the vascular anatomy. Carotid stenosis measurements (when applicable) are obtained utilizing NASCET criteria, using the distal internal carotid diameter as the denominator. Multiphase CT imaging of the brain was performed following IV bolus contrast injection. Subsequent parametric perfusion maps were calculated using RAPID software. RADIATION DOSE REDUCTION: This exam was performed according to the departmental dose-optimization program which includes automated exposure control, adjustment of the mA and/or kV according to patient size and/or use of iterative reconstruction technique. CONTRAST:  Administered contrast not known at this time. COMPARISON:  Noncontrast head CT performed earlier today 12/19/2023. CT angiogram head/neck 12/22/2022. FINDINGS: CTA NECK FINDINGS Aortic arch: Standard aortic branching. Atherosclerotic plaque within the visualized thoracic aorta and proximal major branch vessels of the neck. Streak/beam hardening artifact arising from a dense contrast bolus partially obscures the left subclavian artery. Within this limitation, there is no appreciable hemodynamically significant innominate or proximal subclavian artery stenosis. Right carotid system: CCA and ICA patent within the neck without stenosis. Minimal atherosclerotic plaque at the carotid bifurcation. Left carotid system: CCA and ICA patent within the neck without stenosis. Minimal atherosclerotic plaque at the CCA origin. Mild atherosclerotic  plaque about the carotid bifurcation and within the proximal ICA. Vertebral arteries: Patent within the neck without stenosis or significant atherosclerotic disease. The right vertebral artery is dominant. Skeleton: Nonspecific reversal of the expected cervical lordosis. Spondylosis at the cervical and visualized upper thoracic levels. Poor dentition. Other neck: No neck mass or cervical lymphadenopathy. Upper chest: No consolidation within the imaged lung apices. Prior median sternotomy. Review of the MIP images confirms the above findings CTA HEAD FINDINGS Anterior circulation: The intracranial internal carotid arteries are patent. Atherosclerotic plaque within both vessels. Most notably, calcified plaque results in up to moderate stenosis of the paraclinoid right ICA. The M1 middle cerebral arteries are patent. No M2 proximal branch occlusion or high-grade proximal stenosis. The right anterior cerebral artery A1 segment is markedly hypoplastic or developmentally absent. Focal occlusion versus severe near occlusive stenosis of the right anterior cerebral artery A3 segment (series 5, image 76) (series 10, image 45). It is difficult to determine if this was present on the prior CTA of 12/22/2022 (due to the degree of motion degradation on the prior exam). Immediately  distal to this, additional severe stenosis within the right anterior cerebral artery A3 segment. The left anterior cerebral artery is patent. No intracranial aneurysm is identified. Posterior circulation: The intracranial vertebral arteries are patent. Mild atherosclerotic plaque within the right vertebral artery at the V3/V4 junction. Atherosclerotic plaque at the left vertebral artery V3/V4 junction with no more than mild stenosis. The basilar artery is patent. The posterior cerebral arteries are patent. Hypoplastic right P1 segment with sizable right posterior communicating artery. The left posterior communicating artery is diminutive Venous sinuses:  Evaluation for dural venous sinus thrombosis is limited due to contrast timing. Anatomic variants: As described. Review of the MIP images confirms the above findings CT Brain Perfusion Findings: Poor source data quality limits reliability of the perfusion values. Within this limitation, findings are as follows. CBF (<30%) Volume: 0mL Perfusion (Tmax>6.0s) volume: 0mL Mismatch Volume: 0mL Infarction location:None identified. CTA head impression #1 called by telephone at the time of interpretation on 12/19/2023 at 12:43 pm to provider [facility] , who verbally acknowledged these results. IMPRESSION: CTA neck: 1. The common carotid and internal carotid arteries are patent within the neck without stenosis. Mild atherosclerotic plaque bilaterally, as described. 2. The vertebral arteries are patent within the neck without stenosis or significant atherosclerotic disease. 3. Aortic Atherosclerosis (ICD10-I70.0). CTA head: 1. Focal occlusion versus severe, near-occlusive stenosis of the right anterior cerebral artery A3 segment. It is difficult to determine if this was present on the prior CTA of 12/22/2022 (due to the degree of motion degradation on the prior exam). Immediately downstream from this, additional severe stenosis within the right anterior cerebral artery A3 segment. 2. Background intracranial atherosclerotic disease. Most notably, calcified plaque results in up to moderate stenosis of the paraclinoid right internal carotid artery. CT perfusion head: Poor source data quality limits reliability of the perfusion values. Within this limitation, the perfusion software identifies no core infarct. The perfusion software identifies no critically hypoperfused parenchyma (utilizing the Tmax>6 seconds threshold). No mismatch volume reported. Electronically Signed   By: Bascom Lily D.O.   On: 12/19/2023 12:43   CT HEAD CODE STROKE WO CONTRAST Result Date: 12/19/2023 CLINICAL DATA:  Code stroke. Neuro deficit, acute,  stroke suspected. EXAM: CT HEAD WITHOUT CONTRAST TECHNIQUE: Contiguous axial images were obtained from the base of the skull through the vertex without intravenous contrast. RADIATION DOSE REDUCTION: This exam was performed according to the departmental dose-optimization program which includes automated exposure control, adjustment of the mA and/or kV according to patient size and/or use of iterative reconstruction technique. COMPARISON:  CT angiogram head/neck 12/22/2022. Brain MRI 12/21/2022. FINDINGS: Brain: Generalized cerebral atrophy. Known chronic right anterior cerebral artery vascular territory infarct within the medial right frontal lobe/corpus callosum. Unchanged chronic infarct again demonstrated within the right frontal lobe periventricular white matter and anterior right subinsular white matter. Unchanged small chronic cortical infarct within the anterior left frontal lobe. Background moderate patchy and ill-defined hypoattenuation within the cerebral white matter, nonspecific but compatible chronic small vessel ischemic disease. There is no acute intracranial hemorrhage. No acute demarcated cortical infarct. No extra-axial fluid collection. No evidence of an intracranial mass. No midline shift. No hyperdense vessel. Atherosclerotic calcifications. Vascular: No calvarial fracture or aggressive osseous lesion. Skull: No mass or acute finding within the imaged orbits. No significant paranasal sinus disease. Sinuses/Orbits: No mass or acute finding within the imaged orbits. Trace mucosal thickening within the bilateral ethmoid and right maxillary sinuses at the imaged levels. ASPECTS [facility] Stroke Program Early CT Score) - Ganglionic  level infarction (caudate, lentiform nuclei, internal capsule, insula, M1-M3 cortex): 7 - Supraganglionic infarction (M4-M6 cortex): 3 Total score (0-10 with 10 being normal): 10 (when discounting chronic infarcts). No acute intracranial finding. These results were  communicated to Dr. Bonnita Buttner at 12:12 pmon 5/18/2025by text page via the [facility] messaging system. IMPRESSION: 1.  No acute intracranial finding. 2. Parenchymal atrophy, chronic small vessel ischemic disease and chronic infarcts, as described. Electronically Signed   By: Bascom Lily D.O.   On: 12/19/2023 12:12   ECHOCARDIOGRAM COMPLETE Result Date: 12/09/2023    ECHOCARDIOGRAM REPORT   Patient Name:   Kelsie Kramp. Date of Exam: 12/09/2023 Medical Rec #:  [id number]          Height:       70.0 in Accession #:    [id number]         Weight:       215.8 lb Date of Birth:  [birth year]          BSA:          2.156 m Patient Age:    72 years           BP:           102/68 mmHg Patient Gender: M                  HR:           63 bpm. Exam Location:  [address] Procedure: 2D Echo, Cardiac Doppler, Color Doppler, 3D Echo and Strain Analysis            (Both Spectral and Color Flow Doppler were utilized during            procedure). Indications:    I35.0 AS  History:        Patient has prior history of Echocardiogram examinations, most                 recent 12/22/2022. CAD and Previous Myocardial Infarction,                 Stroke, AS; Risk Factors:Hypertension, Diabetes and                 Dyslipidemia.  Sonographer:    Lula Sale RDCS Referring Phys: [phone] TRACI R TURNER IMPRESSIONS  1. Left ventricular ejection fraction, by estimation, is 60 to 65%. The left ventricle has normal function. The left ventricle has no regional wall motion abnormalities. There is mild left ventricular hypertrophy. Left ventricular diastolic parameters were normal.  2. Right ventricular systolic function is normal. The right ventricular size is normal. Tricuspid regurgitation signal is inadequate for assessing PA pressure.  3. The mitral valve is normal in structure. Trivial mitral valve regurgitation. No evidence of mitral stenosis.  4. The aortic valve is tricuspid. There is moderate calcification of the aortic valve. Aortic valve  regurgitation is trivial. Mild to moderate aortic valve stenosis. Vmax 2.5 m/s, MG 12 mmHg, AVA 1.8 cm^2, DI 0.38  5. The inferior vena cava is normal in size with greater than 50% respiratory variability, suggesting right atrial pressure of 3 mmHg. FINDINGS  Left Ventricle: Left ventricular ejection fraction, by estimation, is 60 to 65%. The left ventricle has normal function. The left ventricle has no regional wall motion abnormalities. Global longitudinal strain performed but not reported based on interpreter judgement due to suboptimal tracking. 3D ejection fraction reviewed and evaluated as part of the interpretation. Alternate measurement of EF is felt to be  most reflective of LV function. The left ventricular internal cavity size was normal in  size. There is mild left ventricular hypertrophy. Left ventricular diastolic parameters were normal. Right Ventricle: The right ventricular size is normal. No increase in right ventricular wall thickness. Right ventricular systolic function is normal. Tricuspid regurgitation signal is inadequate for assessing PA pressure. Left Atrium: Left atrial size was normal in size. Right Atrium: Right atrial size was normal in size. Pericardium: There is no evidence of pericardial effusion. Mitral Valve: The mitral valve is normal in structure. Trivial mitral valve regurgitation. No evidence of mitral valve stenosis. Tricuspid Valve: The tricuspid valve is normal in structure. Tricuspid valve regurgitation is trivial. Aortic Valve: The aortic valve is tricuspid. There is moderate calcification of the aortic valve. Aortic valve regurgitation is trivial. Aortic regurgitation PHT measures 580 msec. Mild to moderate aortic stenosis is present. Aortic valve mean gradient measures 12.0 mmHg. Aortic valve peak gradient measures 25.2 mmHg. Aortic valve area, by VTI measures 1.76 cm. Pulmonic Valve: The pulmonic valve was not well visualized. Pulmonic valve regurgitation is trivial.  Aorta: The aortic root is normal in size and structure. Venous: The inferior vena cava is normal in size with greater than 50% respiratory variability, suggesting right atrial pressure of 3 mmHg. IAS/Shunts: The interatrial septum was not well visualized. Additional Comments: 3D was performed not requiring image post processing on an independent workstation and was normal.  LEFT VENTRICLE PLAX 2D LVIDd:         4.70 cm   Diastology LVIDs:         2.80 cm   LV e' medial:    7.83 cm/s LV PW:         1.10 cm   LV E/e' medial:  11.7 LV IVS:        1.30 cm   LV e' lateral:   7.83 cm/s LVOT diam:     2.40 cm   LV E/e' lateral: 11.7 LV SV:         88 LV SV Index:   41        2D Longitudinal Strain LVOT Area:     4.52 cm  2D Strain GLS (A4C):   -14.8 %                          2D Strain GLS (A3C):   -14.3 %                          2D Strain GLS (A2C):   -18.5 %                          2D Strain GLS Avg:     -15.9 %                           3D Volume EF:                          3D EF:        55 %                          LV EDV:       191 ml  LV ESV:       87 ml                          LV SV:        105 ml RIGHT VENTRICLE            IVC RV S prime:     9.90 cm/s  IVC diam: 1.00 cm TAPSE (M-mode): 1.6 cm LEFT ATRIUM             Index        RIGHT ATRIUM           Index LA diam:        3.70 cm 1.72 cm/m   RA Pressure: 3.00 mmHg LA Vol (A2C):   59.8 ml 27.74 ml/m  RA Area:     19.40 cm LA Vol (A4C):   65.4 ml 30.34 ml/m  RA Volume:   55.60 ml  25.79 ml/m LA Biplane Vol: 63.1 ml 29.27 ml/m  AORTIC VALVE AV Area (Vmax):    1.67 cm AV Area (Vmean):   1.84 cm AV Area (VTI):     1.76 cm AV Vmax:           251.00 cm/s AV Vmean:          156.000 cm/s AV VTI:            0.499 m AV Peak Grad:      25.2 mmHg AV Mean Grad:      12.0 mmHg LVOT Vmax:         92.60 cm/s LVOT Vmean:        63.500 cm/s LVOT VTI:          0.194 m LVOT/AV VTI ratio: 0.39 AI PHT:            580 msec  AORTA Ao Root diam: 3.60  cm Ao Asc diam:  3.80 cm MITRAL VALVE                TRICUSPID VALVE MV Area (PHT): 3.11 cm     Estimated RAP:  3.00 mmHg MV Decel Time: 244 msec MV E velocity: 91.70 cm/s   SHUNTS MV A velocity: 103.00 cm/s  Systemic VTI:  0.19 m MV E/A ratio:  0.89         Systemic Diam: 2.40 cm Carson Clara MD Electronically signed by Carson Clara MD Signature Date/Time: 12/09/2023/2:31:27 PM    Final    CUP PACEART REMOTE DEVICE CHECK Result Date: 12/06/2023 ILR summary report received. Battery status OK. Normal device function. No new symptom, tachy, brady, or pause episodes. No new AF episodes. Monthly summary reports and ROV/PRN LA, CVRS    TODAY-DAY OF DISCHARGE:  Subjective:   Tyler Guerra today has no headache,no chest abdominal pain,no new weakness tingling or numbness, feels much better wants to go home today.   Objective:   Blood pressure (!) 169/93, pulse 62, temperature 98.3 F (36.8 C), temperature source Oral, resp. rate 18, weight 95.4 kg, SpO2 97%.  Intake/Output Summary (Last 24 hours) at 12/22/2023 0916 Last data filed at 12/22/2023 0524 Gross per 24 hour  Intake --  Output 200 ml  Net -200 ml   Filed Weights   12/19/23 1100  Weight: 95.4 kg    Exam: Awake Alert, Oriented *3, No new F.N deficits, Normal affect [place].AT,PERRAL Supple Neck,No JVD, No cervical lymphadenopathy appriciated.  Symmetrical Chest wall movement, Good air movement bilaterally, CTAB RRR,No Gallops,Rubs  or new Murmurs, No Parasternal Heave +ve B.Sounds, Abd Soft, Non tender, No organomegaly appriciated, No rebound -guarding or rigidity. No Cyanosis, Clubbing or edema, No new Rash or bruise   PERTINENT RADIOLOGIC STUDIES: DG CHEST PORT 1 VIEW Result Date: 12/21/2023 CLINICAL DATA:  Fever. EXAM: PORTABLE CHEST 1 VIEW COMPARISON:  Dec 19, 2023. FINDINGS: Stable cardiomediastinal silhouette. Status post coronary bypass graft. Lungs are clear. Bony thorax is unremarkable. IMPRESSION: No active  disease. Electronically Signed   By: Rosalene Colon M.D.   On: 12/21/2023 10:23     PERTINENT LAB RESULTS: CBC: Recent Labs    12/19/23 1154 12/19/23 1200 12/21/23 1103  WBC 8.5  --  7.1  HGB 13.1 13.3 12.5*  HCT 38.7* 39.0 36.0*  PLT 159  --  150   CMET CMP     Component Value Date/Time   NA 138 12/21/2023 1103   NA 142 10/22/2021 1047   K 3.7 12/21/2023 1103   CL 107 12/21/2023 1103   CO2 22 12/21/2023 1103   GLUCOSE 104 (H) 12/21/2023 1103   GLUCOSE 91 08/06/2010 1536   BUN 19 12/21/2023 1103   BUN 20 10/22/2021 1047   CREATININE 1.03 12/21/2023 1103   CALCIUM  9.0 12/21/2023 1103   PROT 7.0 12/19/2023 1154   ALBUMIN 3.7 12/19/2023 1154   AST 25 12/19/2023 1154   ALT 16 12/19/2023 1154   ALKPHOS 97 12/19/2023 1154   BILITOT 1.6 (H) 12/19/2023 1154   GFR 83.44 11/30/2023 1151   EGFR 72 10/22/2021 1047   GFRNONAA >60 12/21/2023 1103    GFR Estimated Creatinine Clearance: 76.3 mL/min (by C-G formula based on SCr of 1.03 mg/dL). No results for input(s): "LIPASE", "AMYLASE" in the last 72 hours. No results for input(s): "CKTOTAL", "CKMB", "CKMBINDEX", "TROPONINI" in the last 72 hours. Invalid input(s): "POCBNP" No results for input(s): "DDIMER" in the last 72 hours. No results for input(s): "HGBA1C" in the last 72 hours. Recent Labs    12/20/23 0652  CHOL 100  HDL 34*  LDLCALC 51  TRIG 76  CHOLHDL 2.9   No results for input(s): "TSH", "T4TOTAL", "T3FREE", "THYROIDAB" in the last 72 hours.  Invalid input(s): "FREET3" No results for input(s): "VITAMINB12", "FOLATE", "FERRITIN", "TIBC", "IRON", "RETICCTPCT" in the last 72 hours. Coags: Recent Labs    12/19/23 1154  INR 1.2   Microbiology: Recent Results (from the past 240 hours)  SARS Coronavirus 2 by RT PCR (hospital order, performed in [facility] hospital lab) *cepheid single result test* Anterior Nasal Swab     Status: None   Collection Time: 12/19/23  1:03 PM   Specimen: Anterior Nasal Swab   Result Value Ref Range Status   SARS Coronavirus 2 by RT PCR NEGATIVE NEGATIVE Final    Comment: Performed at [facility], [address] [place], [place] [address]    FURTHER DISCHARGE INSTRUCTIONS:  Get Medicines reviewed and adjusted: Please take all your medications with you for your next visit with your Primary MD  Laboratory/radiological data: Please request your Primary MD to go over all hospital tests and procedure/radiological results at the follow up, please ask your Primary MD to get all Hospital records sent to his/her office.  In some cases, they will be blood work, cultures and biopsy results pending at the time of your discharge. Please request that your primary care M.D. goes through all the records of your hospital data and follows up on these results.  Also Note the following: If you experience  worsening of your admission symptoms, develop shortness of breath, life threatening emergency, suicidal or homicidal thoughts you must seek medical attention immediately by calling 911 or calling your MD immediately  if symptoms less severe.  You must read complete instructions/literature along with all the possible adverse reactions/side effects for all the Medicines you take and that have been prescribed to you. Take any new Medicines after you have completely understood and accpet all the possible adverse reactions/side effects.   Do not drive when taking Pain medications or sleeping medications (Benzodaizepines)  Do not take more than prescribed Pain, Sleep and Anxiety Medications. It is not advisable to combine anxiety,sleep and pain medications without talking with your primary care practitioner  Special Instructions: If you have smoked or chewed Tobacco  in the last 2 yrs please stop smoking, stop any regular Alcohol   and or any Recreational drug use.  Wear Seat belts while driving.  Please note: You were cared for by a hospitalist during your hospital stay. Once  you are discharged, your primary care physician will handle any further medical issues. Please note that NO REFILLS for any discharge medications will be authorized once you are discharged, as it is imperative that you return to your primary care physician (or establish a relationship with a primary care physician if you do not have one) for your post hospital discharge needs so that they can reassess your need for medications and monitor your lab values.  Total Time spent coordinating discharge including counseling, education and face to face time equals greater than 30 minutes.  SignedKimberly Penna 12/22/2023 9:16 AM

## 2023-12-22 NOTE — H&P (Shared)
 Physical Medicine and Rehabilitation Admission H&P    Chief Complaint  Patient presents with   Functional deficits due to stroke.     HPI:  Tyler Guerra. 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 Bosie Bye) who was admitted to [facility] on 12/19/23 with headache, mental status changes with difficulty following commands, left sided weakness with left gaze preference and was noted to be confused.  He had mild temp elevation in ED- 100.8 with lactate 2.4 and was started on IVF and empiric antibiotics. Wife questioned UTI and UA done negative. CXR with moderate low lung volumes. CTA head done showing  focal occlusion v/s severe near occlusive stenosis of right anterior cerebral artery A3 segment as well as calcified plaque resulting in moderate stenosis of paraclinoid R-ICA. No core infarct on perfusion scan. MRI brain done revealing 3 mm infarct right periartrial white matter with background parenchymal atrophy, small vessel disease and chronic infarcts left cerebral white matter. 2D echo from 05/13 showed EF 60-65% with mild to moderate aortic stenosis and mild LVH. EEG negative for seizures.   Dr. Janett Medin felt that stroke was due to small vessel disease and to continue ASA/Brillinta X 30 days followed by ASA alone. Loop recorder interrogated and was negative for A fib. Right inattention is improving but he continued to have low grade fevers which has resolved.  Antibiotics dc 05/20 as work up negative.  He has had issues with confusion since yesterday and CXR negative and WBC stable. He continues to be limited by weakness and cognitive deficits with delay in processing, is impulsive and has poor safety awareness. Therapy has been working with patient who requires min   Review of Systems  Constitutional:  Negative for chills and fever.  HENT:  Positive for hearing loss (Hard of  hearing).   Eyes:  Positive for blurred vision. Negative for double vision.  Respiratory:  Negative for cough and shortness of breath.   Cardiovascular:  Negative for chest pain.  Gastrointestinal:  Positive for constipation (LMB today- improved) and nausea. Negative for abdominal pain, diarrhea and vomiting.  Genitourinary:  Positive for urgency.       Hesitancy  Musculoskeletal:  Positive for joint pain (R knee and hip pain).  Skin:  Negative for rash.  Neurological:  Positive for headaches. Negative for sensory change.    Past Medical History:  Diagnosis Date   Anxiety state 03/16/2016   Aortic stenosis    Mild to moderate by echo 12/2023 with mean aortic valve gradient 12 mmHg   Aphasia 01/26/2017   Benign localized prostatic hyperplasia with lower urinary tract symptoms (LUTS)    BPV (benign positional vertigo) 11/02/2014   Carotid artery stenosis    PER DUPLEX 10-22-2015  BILATERAL ICA 1-39%   Cataract    Coronary artery disease    CABG 2000 with LIMA> LAD, SVG > RCA and free radial > OM. 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   Gout    Gout 07/18/2010   Qualifier: Diagnosis of  By: Vallarie Gauze MD, Tyrone Gallop  H/O eye injury    chronic changes to left eye after injury   Hand weakness 03/02/2018   Hearing loss 04/21/2013   History of TIA (transient ischemic attack)    03/ 2017  no residual after brief episode loss peripheral vision   HOH (hard of hearing)    Hyperglycemia 05/17/2016   Hyperlipemia    Hyperlipidemia 07/18/2010   Qualifier: Diagnosis of  By: Vallarie Gauze MD, Tyrone Gallop     Hypertension    HYPERTENSION, BENIGN ESSENTIAL 07/18/2010   Qualifier: Diagnosis of  By: Vallarie Gauze  MD, Graham     Hypokalemia 01/26/2017   Knee pain 03/13/2019   Left shoulder pain 09/04/2018   Pneumothorax 10/11/2019   RIGHT    Prostate cancer (HCC) UROLOGIST-  DR GRAPEY/  ONCOLOGIST-  DR MANNING   dx 2015--- Stage T1c, Gleason 3+4, PSA 4.03, vol 44cc   PVC's (premature ventricular contractions)    Rash and nonspecific skin eruption 10/14/2015   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   S/P CABG x 05 Dec 1998   Skin lesion 10/14/2015   Stroke [facility])    TIA (transient ischemic attack) 10/14/2015   Ventral hernia 10/26/2011   Wears glasses     Past Surgical History:  Procedure Laterality Date   APPENDECTOMY  1975   CARDIAC CATHETERIZATION  12-22-2001   dr Kay Parson   severe native vessel disease mLAD 60-70%,  total occulsion pCFX  and pRCA/  widely patent saphenous vein , free radial , and LIMA grafts/  minminal lv dysfunction, ef 60%   CHEST TUBE INSERTION Right 10/11/2019   CORONARY ARTERY BYPASS GRAFT  12/1998   LIMA to LAD,  SVG to PDA and Diagonal, Free radial graft to OM   CORONARY STENT INTERVENTION N/A 05/26/2021   Procedure: CORONARY STENT INTERVENTION;  Surgeon: Kyra Phy, MD;  Location: [facility] IN[facility]AS[facility];  Service: Cardiovascular;  Laterality: N/A;   Exericse treadmill test  last one 01-03-2014  dr Kay Parson   normal exercise tolerance w/ hypertensive repsonse,  no ischemic EKG changes, appropriate HR response & recovery ([facility] TM score 9;  Low Risk , PVC's w/ exertion)   EYE SURGERY     FRACTURE SURGERY     LEFT HEART CATH AND CORS/GRAFTS ANGIOGRAPHY N/A 05/26/2021   Procedure: LEFT HEART CATH AND CORS/GRAFTS ANGIOGRAPHY;  Surgeon: Kyra Phy, MD;  Location: [facility] CV [facility];  Service: Cardiovascular;  Laterality: N/A;   LOOP RECORDER INSERTION N/A 12/22/2022   Procedure: LOOP RECORDER INSERTION;  Surgeon: Tammie Fall, MD;  Location: [facility] CV [facility];  Service: Cardiovascular;  Laterality: N/A;   PROSTATE BIOPSY     RADIOACTIVE  SEED IMPLANT N/A 01/13/2016   Procedure: RADIOACTIVE SEED IMPLANT/BRACHYTHERAPY IMPLANT;  Surgeon: Ann Barnacle, MD;  Location: [facility];  Service: Urology;  Laterality: N/A;    Family History  Problem Relation Age of Onset   Dementia Mother    Heart disease Mother    Cancer Father        prostate in eighties   Prostate cancer Father    Heart disease Father    Stroke Father    Cancer Sister        breast   Anxiety disorder Sister    Colon cancer Maternal Grandfather    Cancer Paternal Uncle        prostate    Social History: Married. Independent PTA. Retired Medical illustrator for [employer]. He reports that he has  never smoked. He has never used smokeless tobacco. He reports that he used to drink alcohol  on rare occasions and does not use drugs.   Allergies: No Known Allergies   Medications Prior to Admission  Medication Sig Dispense Refill   allopurinol  (ZYLOPRIM ) 300 MG tablet Take 1 tablet (300 mg total) by mouth daily. 90 tablet 3   aspirin  EC 81 MG tablet Take 1 tablet (81 mg total) by mouth daily. 90 tablet 3   atorvastatin  (LIPITOR ) 80 MG tablet Take 1 tablet (80 mg total) by mouth daily. (Patient taking differently: Take 80 mg by mouth at bedtime.) 90 tablet 3   Cholecalciferol  (VITAMIN D  PO) Take 1 capsule by mouth daily.     clopidogrel  (PLAVIX ) 75 MG tablet Take 1 tablet (75 mg total) by mouth daily. 90 tablet 1   escitalopram  (LEXAPRO ) 5 MG tablet Take 1 tablet (5 mg total) by mouth daily. 90 tablet 3   metoprolol  tartrate (LOPRESSOR ) 25 MG tablet Take 0.5 tablets (12.5 mg total) by mouth 2 (two) times daily.     Misc Natural Products (BLACK CHERRY CONCENTRATE PO) Take 1 tablet by mouth at bedtime.     Multiple Vitamins-Minerals (OCUVITE PO) Take 1 capsule by mouth daily.     nitroGLYCERIN  (NITROSTAT ) 0.4 MG SL tablet Place 1 tablet (0.4 mg total) under the tongue every 5 (five) minutes as needed for chest pain. 25 tablet 2   pantoprazole  (PROTONIX ) 40 MG  tablet Take 1 tablet (40 mg total) by mouth daily. 90 tablet 3   polyethylene glycol powder (GLYCOLAX /MIRALAX ) 17 GM/SCOOP powder Take 17 g by mouth daily as needed for mild constipation. 238 g 0   Propylene Glycol (SYSTANE BALANCE) 0.6 % SOLN Place 1 drop into both eyes daily as needed (dry eyes).     sodium chloride  (OCEAN) 0.65 % SOLN nasal spray Place 1 spray into both nostrils as needed for congestion.     tamsulosin  (FLOMAX ) 0.4 MG CAPS capsule Take 1 capsule (0.4 mg total) by mouth daily. 90 capsule 3   Vitamin A 3 MG CAPS Take by mouth.        Home: Home Living Family/patient expects to be discharged to:: Private residence Living Arrangements: Spouse/significant other Available Help at Discharge: Family Type of Home: House Home Access: Level entry Home Layout: Two level, Able to live on main level with bedroom/bathroom Bathroom Shower/Tub: Engineer, manufacturing systems: Standard Home Equipment: Information systems manager, Medical laboratory scientific officer - single point, Other (comment) (Duet Rollator that converts to transport chair) Additional Comments: has been driving (stick shift) and wife checks on him via the 360 app   Functional History: Prior Function Prior Level of Function : Needs assist Mobility Comments: independent ADLs Comments: pt spouse manages bills, medications  Functional Status:  Mobility: Bed Mobility Overal bed mobility: Needs Assistance Bed Mobility: Supine to Sit, Sit to Supine Supine to sit: Mod assist Sit to supine: Mod assist General bed mobility comments: multimodal cues for sequencing, assist for trunk elevation to come EOB and then assist for BLE back into bed for supine. assist for all aspects of line management Transfers Overall transfer level: Needs assistance Equipment used: 2 person hand held assist Transfers: Sit to/from Stand Sit to Stand: Min assist, +2 physical assistance, +2 safety/equipment, From elevated surface (ED stretcher) General transfer comment: min A for  boost and balance, more assist on the left side Ambulation/Gait Ambulation/Gait assistance: Min assist, +2 safety/equipment Gait Distance (Feet): 15 Feet Assistive device: 1 person hand held assist Gait  Pattern/deviations: Step-through pattern, Decreased stride length General Gait Details: Handheld assist for postural stability and environmental guidance, max multimodal cueing due to vision and left inattention Gait velocity: decreased Gait velocity interpretation: <1.31 ft/sec, indicative of household ambulator    ADL: ADL Overall ADL's : Needs assistance/impaired Eating/Feeding: NPO Grooming: Wash/dry face, Moderate assistance, Sitting Upper Body Bathing: Moderate assistance Upper Body Bathing Details (indicate cue type and reason): for back Lower Body Bathing: Moderate assistance, Sitting/lateral leans Lower Body Bathing Details (indicate cue type and reason): multimodal cues to attend to left side Upper Body Dressing : Moderate assistance Upper Body Dressing Details (indicate cue type and reason): new gown Lower Body Dressing: Maximal assistance Toilet Transfer: Minimal assistance, +2 for safety/equipment, Ambulation Toilet Transfer Details (indicate cue type and reason): 2 person HHA, assist for initiation, safety with navigating environment Toileting- Clothing Manipulation and Hygiene: Moderate assistance, Sit to/from stand Functional mobility during ADLs: Minimal assistance, +2 for safety/equipment, +2 for physical assistance (2 person HHA) General ADL Comments: L inattention, L visual deficits, decreaseb balance, decreased cog  Cognition: Cognition Overall Cognitive Status: No family/caregiver present to determine baseline cognitive functioning (suspect different) Arousal/Alertness: Awake/alert Orientation Level: Oriented to person, Oriented to place, Oriented to situation, Disoriented to time Attention: Sustained Sustained Attention: Impaired Sustained Attention  Impairment: Verbal basic, Functional basic Memory: Impaired Memory Impairment: Storage deficit, Retrieval deficit Awareness: Impaired Awareness Impairment: Intellectual impairment Problem Solving: Impaired Problem Solving Impairment: Verbal basic Executive Function: Decision Making (impulsive) Safety/Judgment: Impaired Comments: impulsive Cognition Arousal: Alert Behavior During Therapy: Flat affect Overall Cognitive Status: No family/caregiver present to determine baseline cognitive functioning (suspect different)   Blood pressure (!) 169/93, pulse 62, temperature 98.3 F (36.8 C), temperature source Oral, resp. rate 18, weight 95.4 kg, SpO2 97%. Physical Exam Constitutional:      Appearance: Normal appearance.     Comments: Restless and attempted to get OOB twice to void.   Neurological:     Mental Status: He is alert.     General: No apparent distress HEENT: Head is normocephalic, atraumatic, sclera anicteric, oral mucosa pink and moist, dentition  decreased- reports he wears dentures, + glasses Neck: Supple without JVD or lymphadenopathy Heart: Reg rate and rhythm. No murmurs rubs or gallops Chest: CTA bilaterally without wheezes, rales, or rhonchi; no distress Abdomen: Soft, non-tender, non-distended, bowel sounds positive. Extremities: No clubbing, cyanosis, or edema. Pulses are 2+ Psych: Pt's affect is appropriate. Pt is cooperative Skin: Clean and intact without signs of breakdown Neuro:    Mental Status: AAO to May 2025, note date, not city, Hospital, knows its Wednesday, decreased memory, Able to name president, able to provide his birthday. Delayed responses, Poor safety awareness. Distractible and poor attention. Confused. Mild L inattention Speech/Languate: Naming and repetition intact, fluent, follows simple commands but has more difficulty with multi step or complex commands CRANIAL NERVES: II: PERRL. Visual fields full III, IV, VI: EOM overall intact,  requires multiple attempts to complete V: normal sensation bilaterally VII: Mild left facial weakness  VIII: Hard of hearing IX, X: normal palatal elevation XI: 5/5 head turn and 5/5 shoulder shrug bilaterally XII: Tongue midline   MOTOR: RUE: 5/5 Deltoid, 5/5 Biceps, 5/5 Triceps,5/5 Grip LUE: 5/5 Deltoid, 4+/5 Biceps, 4+/5 Triceps, 4+/5 Grip RLE: HF 5/5, KE 5/5, ADF 5/5, APF 5/5 LLE: HF 5/5, KE 5/5, ADF 5/5, APF 5/5   REFLEXES: No ankle clonus  SENSORY: Normal to touch all 4 extremities  No hypertonia noted   Coordination: Normal finger to nose  altered LUE   Results for orders placed or performed during the hospital encounter of 12/19/23 (from the past 48 hours)  Glucose, capillary     Status: None   Collection Time: 12/20/23 11:50 PM  Result Value Ref Range   Glucose-Capillary 85 70 - 99 mg/dL    Comment: Glucose reference range applies only to samples taken after fasting for at least 8 hours.  CBC     Status: Abnormal   Collection Time: 12/21/23 11:03 AM  Result Value Ref Range   WBC 7.1 4.0 - 10.5 K/uL   RBC 4.18 (L) 4.22 - 5.81 MIL/uL   Hemoglobin 12.5 (L) 13.0 - 17.0 g/dL   HCT 81.1 (L) 91.4 - 78.2 %   MCV 86.1 80.0 - 100.0 fL   MCH 29.9 26.0 - 34.0 pg   MCHC 34.7 30.0 - 36.0 g/dL   RDW 95.6 21.3 - 08.6 %   Platelets 150 150 - 400 K/uL   nRBC 0.0 0.0 - 0.2 %    Comment: Performed at [facility], [address], [place], [place] [address]  Basic metabolic panel with GFR     Status: Abnormal   Collection Time: 12/21/23 11:03 AM  Result Value Ref Range   Sodium 138 135 - 145 mmol/L   Potassium 3.7 3.5 - 5.1 mmol/L   Chloride 107 98 - 111 mmol/L   CO2 22 22 - 32 mmol/L   Glucose, Bld 104 (H) 70 - 99 mg/dL    Comment: Glucose reference range applies only to samples taken after fasting for at least 8 hours.   BUN 19 8 - 23 mg/dL   Creatinine, Ser 9.62 0.61 - 1.24 mg/dL   Calcium  9.0 8.9 - 10.3 mg/dL   GFR, Estimated >95 >28 mL/min    Comment:  (NOTE) Calculated using the CKD-EPI Creatinine Equation (2021)    Anion gap 9 5 - 15    Comment: Performed at [facility], [address] [place], [place] [address]  Procalcitonin     Status: None   Collection Time: 12/21/23 11:03 AM  Result Value Ref Range   Procalcitonin <0.10 ng/mL    Comment:        Interpretation: PCT (Procalcitonin) <= 0.5 ng/mL: Systemic infection (sepsis) is not likely. Local bacterial infection is possible. (NOTE)       Sepsis PCT Algorithm           Lower Respiratory Tract                                      Infection PCT Algorithm    ----------------------------     ----------------------------         PCT < 0.25 ng/mL                PCT < 0.10 ng/mL          Strongly encourage             Strongly discourage   discontinuation of antibiotics    initiation of antibiotics    ----------------------------     -----------------------------       PCT 0.25 - 0.50 ng/mL            PCT 0.10 - 0.25 ng/mL               OR       >80% decrease in PCT  Discourage initiation of                                            antibiotics      Encourage discontinuation           of antibiotics    ----------------------------     -----------------------------         PCT >= 0.50 ng/mL              PCT 0.26 - 0.50 ng/mL               AND        <80% decrease in PCT             Encourage initiation of                                             antibiotics       Encourage continuation           of antibiotics    ----------------------------     -----------------------------        PCT >= 0.50 ng/mL                  PCT > 0.50 ng/mL               AND         increase in PCT                  Strongly encourage                                      initiation of antibiotics    Strongly encourage escalation           of antibiotics                                     -----------------------------                                           PCT <= 0.25  ng/mL                                                 OR                                        > 80% decrease in PCT                                      Discontinue / Do not initiate  antibiotics  Performed at [facility], [address], [place], [place] [address]    DG CHEST PORT 1 VIEW Result Date: 12/21/2023 CLINICAL DATA:  Fever. EXAM: PORTABLE CHEST 1 VIEW COMPARISON:  Dec 19, 2023. FINDINGS: Stable cardiomediastinal silhouette. Status post coronary bypass graft. Lungs are clear. Bony thorax is unremarkable. IMPRESSION: No active disease. Electronically Signed   By: Rosalene Colon M.D.   On: 12/21/2023 10:23      Blood pressure (!) 169/93, pulse 62, temperature 98.3 F (36.8 C), temperature source Oral, resp. rate 18, weight 95.4 kg, SpO2 97%.  Medical Problem List and Plan: 1. Functional deficits secondary to acute CVA R parietal periventricular white matter   -patient may shower  -ELOS/Goals: 10-14 days, Sup to min A OT, Sup PT, Sup SLP  -Admit to CIR 2.  Antithrombotics: -DVT/anticoagulation:  Pharmaceutical: Lovenox - -antiplatelet therapy:  ASA and Ticagrelor  X 4 weeks followed by ASA alone 3. Pain Management: Tylenol  prn.  4. Mood/Behavior/Sleep: LCSW to follow for evaluation and support.   -antipsychotic agents: N/A 5. Neuropsych/cognition: This patient may be intermittently capable of making decisions on his own behalf. 6. Skin/Wound Care: Routine pressure relief measures.  7. Fluids/Electrolytes/Nutrition: Monitor I/O. Check CMET in am 8. Delirium: Per family has had issues with confusion since yesterday. Low grade fevers are resolving  --Work up 05/20-->CXR- neg. Procal <0.10  WBC 7.1  --Monitor sleep wake disruption (has not slept for more than 24 hours per wife). Tele sitter ordered for fall prevention. Seroquel added to help with sleep. .  9. HTN: Monitor BP TID--remains labile.  --Continue Cozaar  and avoid  hypotension due to intracranial atherosclerosis.  10. T2DM: Hgb A1C- 6.7. Monitor BS ac/hs and use SSI for elevated BS  --CM restrictions added to diet.  11. Hyperlipidemia: LDL @ goal. Continue Lipitor  80 mg 12. Prostate CA: Was on Flomax  0.8 mg till a few months ago.  --Has had hesitancy recently. Hx of retention in the past due to UTI.   --will monitor PVR w/bladder scan. Check UA 13. Class 1 Obesity: Educated on importance of activity and weight loss Body mass index is 30.18 kg/m. 14. H/o gout: STable on allopurinol . 15. Mood disorder: On Lexapro .     Zelda Hickman, PA-C 12/22/2023  I have personally performed a face to face diagnostic evaluation of this patient and formulated the key components of the plan.  Additionally, I have personally reviewed laboratory data, imaging studies, as well as relevant notes and concur with the physician assistant's documentation above.  The patient's status has not changed from the original H&P.  Any changes in documentation from the acute care chart have been noted above.  [name], MD

## 2023-12-22 NOTE — Progress Notes (Signed)
 Physical Therapy Treatment Patient Details Name: Tyler Guerra. MRN: [id number] DOB: 02-Dec-1950 Today's Date: 12/22/2023   History of Present Illness Tyler Guerra. is a 73 y.o. male admitted 12/19/23 with left-sided weakness and right gaze preference, migraine. MRI revealed acute infarct within the right periatrial white matter. PMH includes hypertension, hyperlipidemia, TIA, prostate cancer, right ACA stroke in 5/24, gout, CAD, TIA, anxiety, BPPV, CABG and stents, HOH (L better than R)    PT Comments  Pt is progressing towards goals. Pt requires Min a for sitting to supine, Min A for sit to stand with RW and CGA to occasional Min A with RW for gait. Pt has difficulty maintaining attention to perform gait tasks to monitor the L and work on balance and dual tasking with navigating squares on the floor with 70% accuracy with frequent multi modal cues. Due to pt current functional status, home set up and available assistance at home recommending skilled physical therapy services > 3 hours/day in order to address strength, balance and functional mobility to decrease risk for falls, injury, immobility, skin break down and re-hospitalization.      If plan is discharge home, recommend the following: A little help with walking and/or transfers;Assistance with cooking/housework;Direct supervision/assist for financial management;Assist for transportation     Equipment Recommendations  None recommended by PT       Precautions / Restrictions Precautions Precautions: Fall Recall of Precautions/Restrictions: Impaired Precaution/Restrictions Comments: pt is impulsive Restrictions Weight Bearing Restrictions Per Provider Order: No     Mobility  Bed Mobility Overal bed mobility: Needs Assistance Bed Mobility: Sit to Supine       Sit to supine: Min assist   General bed mobility comments: Min A at LE with occasional cues for positioning.    Transfers Overall transfer level: Needs  assistance Equipment used: Rolling walker (2 wheels) Transfers: Sit to/from Stand Sit to Stand: Min assist           General transfer comment: Min A for initial momentum to get to standing from recliner with verbal cues for safe hand placement.    Ambulation/Gait Ambulation/Gait assistance: Min assist, Contact guard assist Gait Distance (Feet): 250 Feet Assistive device: Rolling walker (2 wheels) Gait Pattern/deviations: Step-through pattern, Decreased stride length, Drifts right/left Gait velocity: decreased Gait velocity interpretation: <1.8 ft/sec, indicate of risk for recurrent falls   General Gait Details: short uneven gait pattern with occasional improvement in steps. Pt was able to work on focusing on squares on the floor and navigating around them with intermittent cues with ~70% accuracy. Pt tends to hit the wall on the L intermittently despite cues. Pt with difficutly finding numbers on the wall on the L    Modified Rankin (Stroke Patients Only) Modified Rankin (Stroke Patients Only) Pre-Morbid Rankin Score: Slight disability Modified Rankin: Moderately severe disability     Balance Overall balance assessment: Needs assistance Sitting-balance support: Feet supported, Single extremity supported Sitting balance-Leahy Scale: Good Sitting balance - Comments: Good balance but needs supervision due to impulsivity   Standing balance support: No upper extremity supported, During functional activity, Bilateral upper extremity supported, Reliant on assistive device for balance Standing balance-Leahy Scale: Fair Standing balance comment: Requires UE support with dynamic gait        Communication Communication Communication: Other (comment) Factors Affecting Communication: Hearing impaired  Cognition Arousal: Alert Behavior During Therapy: Impulsive   PT - Cognitive impairments: Awareness, Attention, Problem solving, Safety/Judgement   PT - Cognition Comments:  Decreased awareness of  deficits, pt was unaware his spouse was sitting in room, difficulty following directions and stating he heard dark music. Following commands: Impaired Following commands impaired: Follows one step commands inconsistently    Cueing Cueing Techniques: Verbal cues, Gestural cues, Tactile cues     General Comments General comments (skin integrity, edema, etc.): Spouse present during session and very supportive. Pt introduced spouse "Tyler Guerra" upon arrival.      Pertinent Vitals/Pain Pain Assessment Pain Assessment: No/denies pain Pain Intervention(s): Monitored during session           PT Goals (current goals can now be found in the care plan section) Acute Rehab PT Goals Patient Stated Goal: pt spouse agreeable to rehab PT Goal Formulation: With patient/family Time For Goal Achievement: 01/03/24 Potential to Achieve Goals: Good Progress towards PT goals: Progressing toward goals    Frequency    Min 3X/week      PT Plan  Continue with current POC        AM-PAC PT "6 Clicks" Mobility   Outcome Measure  Help needed turning from your back to your side while in a flat bed without using bedrails?: A Lot Help needed moving from lying on your back to sitting on the side of a flat bed without using bedrails?: A Lot Help needed moving to and from a bed to a chair (including a wheelchair)?: A Little Help needed standing up from a chair using your arms (e.g., wheelchair or bedside chair)?: A Little Help needed to walk in hospital room?: A Little Help needed climbing 3-5 steps with a railing? : A Lot 6 Click Score: 15    End of Session Equipment Utilized During Treatment: Gait belt Activity Tolerance: Patient tolerated treatment well Patient left: in bed;with call bell/phone within reach;with bed alarm set;with family/visitor present Nurse Communication: Mobility status PT Visit Diagnosis: Unsteadiness on feet (R26.81);Difficulty in walking, not  elsewhere classified (R26.2)     Time: 1610-9604 PT Time Calculation (min) (ACUTE ONLY): 23 min  Charges:    $Gait Training: 8-22 mins $Therapeutic Activity: 8-22 mins PT General Charges $$ ACUTE PT VISIT: 1 Visit                    Sloan Duncans, DPT, CLT  Acute Rehabilitation Services Office: [phone] (Secure chat preferred)    Jenice Mitts 12/22/2023, 1:55 PM

## 2023-12-22 NOTE — Progress Notes (Signed)
 [name], MD  Physician Physical Medicine and Rehabilitation   PMR Pre-admission    Signed   Date of Service: 12/22/2023  9:58 AM  Related encounter: ED to Hosp-Admission (Current) from 12/19/2023 in [facility] 5W Medical Specialty PCU   Signed     Expand All Collapse All  PMR Admission Coordinator Pre-Admission Assessment   Patient: Tyler Guerra. is an 73 y.o., male MRN: [id number] DOB: January 23, 1951 Height:   Weight: 95.4 kg   Insurance Information HMO:     PPO:      PCP:      IPA:      80/20:      OTHER:  PRIMARY: Medicare Part A and B      Policy#: [id number]       Subscriber: pt CM Name:       Phone#:      Fax#:  Pre-Cert#: verified Health and safety inspector:  Benefits:  Phone #:      Name:  Eff. Date: A/B 12/01/16     Deduct: $1676      Out of Pocket Max: n/a      Life Max: n/a CIR: 100%      SNF: 20 full days Outpatient: 80%     Co-Pay: 20% Home Health: 100%      Co-Pay:  DME: 80%     Co-Pay: 20% Providers:  SECONDARY: BCBS Supplement      Policy#: [id number]     Phone#: [phone]   Financial Counselor:       Phone#:    The "Data Collection Information Summary" for patients in Inpatient Rehabilitation Facilities with attached "Privacy Act Statement-Health Care Records" was provided and verbally reviewed with: Patient and Family   Emergency Contact Information Contact Information       Name Relation Home Work Mobile    [place] Spouse [phone]             Other Contacts       Name Relation Home Work Mobile    [name],[name] Daughter     [phone]    [name] Daughter     [phone]           Current Medical History  Patient Admitting Diagnosis: CVA    History of Present Illness: Pt is a 73 y/o male with PMH of HTN, prostate cancer, right ACA stroke in 12/2022, gout, CAD, anxiety, BPPV, CABG, and HOH who presented to [facility] on 12/19/23 with c/o left sided hemiparesis and R gaze preference with associated HA.  In ED NIHSS  11, CT head negative, CTA with focal occlusion vs severe near occlusion of right ACA A3 segment.  Febrile to 100.8 but no leukocytosis.  MRI showed small stroke in the right parietal white matter.  Neurology recommendations for aspirin  and plavix  and loop recorder interrogation.  EEG showed moderate diffuse encephalopathy but no seizures, loop showed no afib.  Fever workup negative so broad spectrum abx stopped.  Therapy evaluations were completed and pt was recommended for CIR.    Complete NIHSS TOTAL: 4   Patient's medical record from [facility] has been reviewed by the rehabilitation admission coordinator and physician.   Past Medical History      Past Medical History:  Diagnosis Date   Anxiety state 03/16/2016   Aortic stenosis      Mild to moderate by echo 12/2023 with mean aortic valve gradient 12 mmHg   Aphasia 01/26/2017   Benign localized prostatic hyperplasia with  lower urinary tract symptoms (LUTS)     BPV (benign positional vertigo) 11/02/2014   Carotid artery stenosis      PER DUPLEX 10-22-2015  BILATERAL ICA 1-39%   Cataract     Coronary artery disease      CABG 2000 with LIMA> LAD, SVG > RCA and free radial > OM. S/P NSTEMI 05/2021 cath with luiminal Irrg SVG>RCA, occluded prox RCA, patent free radial>OM, occluded oLCx, occluded o1   Depression 01/26/2017   Diabetes mellitus without complication (HCC) 03/02/2018   GERD 07/19/2010    Qualifier: Diagnosis of  By: Vallarie Gauze MD, Tyrone Gallop     GERD (gastroesophageal reflux disease)     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   Gout     Gout 07/18/2010    Qualifier: Diagnosis of  By: Vallarie Gauze MD, Tyrone Gallop     H/O eye injury      chronic changes to left eye after injury   Hand weakness 03/02/2018   Hearing loss 04/21/2013   History of TIA (transient ischemic attack)      03/ 2017  no residual after brief episode loss peripheral vision   HOH (hard of hearing)     Hyperglycemia 05/17/2016   Hyperlipemia     Hyperlipidemia 07/18/2010     Qualifier: Diagnosis of  By: Vallarie Gauze MD, Graham     Hypertension     HYPERTENSION, BENIGN ESSENTIAL 07/18/2010    Qualifier: Diagnosis of  By: Vallarie Gauze MD, Graham     Hypokalemia 01/26/2017   Knee pain 03/13/2019   Left shoulder pain 09/04/2018   Pneumothorax 10/11/2019    RIGHT    Prostate cancer (HCC) UROLOGIST-  DR GRAPEY/  ONCOLOGIST-  DR MANNING    dx 2015--- Stage T1c, Gleason 3+4, PSA 4.03, vol 44cc   PVC's (premature ventricular contractions)     Rash and nonspecific skin eruption 10/14/2015   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   S/P CABG x 05 Dec 1998   Skin lesion 10/14/2015   Stroke [facility])     TIA (transient ischemic attack) 10/14/2015   Ventral hernia 10/26/2011   Wears glasses            Has the patient had major surgery during 100 days prior to admission? No   Family History   family history includes Anxiety disorder in his sister; Cancer in his father, paternal uncle, and sister; Colon cancer in his maternal grandfather; Dementia in his mother; Heart disease in his father and mother; Prostate cancer in his father; Stroke in his father.   Current Medications  Current Medications    Current Facility-Administered Medications:    acetaminophen  (TYLENOL ) tablet 650 mg, 650 mg, Oral, Q4H PRN, 650 mg at 12/19/23 1738 **OR** acetaminophen  (TYLENOL ) 160 MG/5ML solution 650 mg, 650 mg, Per Tube, Q4H PRN **OR** acetaminophen  (TYLENOL ) suppository 650 mg, 650 mg, Rectal, Q4H PRN, Kamineni, Neelima, MD   allopurinol  (ZYLOPRIM ) tablet 300 mg, 300 mg, Oral, Daily, Kamineni, Neelima, MD, 300 mg at 12/21/23 0827   aspirin  EC tablet 81 mg, 81 mg, Oral, Daily, Kamineni, Neelima, MD, 81 mg at 12/21/23 0827   atorvastatin  (LIPITOR ) tablet 80 mg, 80 mg, Oral, Daily, Kamineni, Neelima, MD, 80 mg at 12/21/23 0827   cholecalciferol  (VITAMIN D3) 25 MCG (1000 UNIT) tablet 1,000 Units, 1,000 Units, Oral, Daily, Kamineni, Neelima, MD, 1,000 Units at 12/21/23 0827    enoxaparin  (LOVENOX ) injection 40 mg, 40 mg, Subcutaneous, Q24H, Kamineni, Neelima, MD, 40 mg at 12/20/23 1746  escitalopram  (LEXAPRO ) tablet 5 mg, 5 mg, Oral, Daily, Kamineni, Neelima, MD, 5 mg at 12/21/23 0827   nitroGLYCERIN  (NITROSTAT ) SL tablet 0.4 mg, 0.4 mg, Sublingual, Q5 min PRN, Michela Aguas, MD   pantoprazole  (PROTONIX ) EC tablet 40 mg, 40 mg, Oral, Daily, Kamineni, Neelima, MD, 40 mg at 12/21/23 0827   polyethylene glycol powder (GLYCOLAX /MIRALAX ) container 17 g, 17 g, Oral, Daily PRN, Kamineni, Neelima, MD   polyvinyl alcohol  (LIQUIFILM TEARS) 1.4 % ophthalmic solution 1 drop, 1 drop, Both Eyes, Daily PRN, Synthia Ewing, RPH   tamsulosin  (FLOMAX ) capsule 0.4 mg, 0.4 mg, Oral, Daily, Kamineni, Neelima, MD, 0.4 mg at 12/21/23 0827   ticagrelor  (BRILINTA ) tablet 90 mg, 90 mg, Oral, BID, Lehner, Erin C, NP     Patients Current Diet:  Diet Order                  Diet Heart Room service appropriate? Yes; Fluid consistency: Thin  Diet effective now                         Precautions / Restrictions Precautions Precautions: Fall Restrictions Weight Bearing Restrictions Per Provider Order: No    Has the patient had 2 or more falls or a fall with injury in the past year? No   Prior Activity Level Community (5-7x/wk): independent with mobility/ADLs, assist for med management/finances, driving short distances, no DME   Prior Functional Level Self Care: Did the patient need help bathing, dressing, using the toilet or eating? Independent   Indoor Mobility: Did the patient need assistance with walking from room to room (with or without device)? Independent   Stairs: Did the patient need assistance with internal or external stairs (with or without device)? Independent   Functional Cognition: Did the patient need help planning regular tasks such as shopping or remembering to take medications? Needed some help   Patient Information Are you of Hispanic, Latino/a,or  Spanish origin?: A. No, not of Hispanic, Latino/a, or Spanish origin What is your race?: K. Native Hawaiian Do you need or want an interpreter to communicate with a doctor or health care staff?: 0. No   Patient's Response To:  Health Literacy and Transportation Is the patient able to respond to health literacy and transportation needs?: Yes Health Literacy - How often do you need to have someone help you when you read instructions, pamphlets, or other written material from your doctor or pharmacy?: Never In the past 12 months, has lack of transportation kept you from medical appointments or from getting medications?: No In the past 12 months, has lack of transportation kept you from meetings, work, or from getting things needed for daily living?: No   Journalist, newspaper / Equipment Home Equipment: Shower seat, Medical laboratory scientific officer - single point, Other (comment) (Duet Rollator that converts to transport chair)   Prior Device Use: Indicate devices/aids used by the patient prior to current illness, exacerbation or injury? None of the above   Current Functional Level Cognition   Arousal/Alertness: Awake/alert Overall Cognitive Status: No family/caregiver present to determine baseline cognitive functioning (suspect different) Orientation Level: Oriented to person, Oriented to place, Oriented to time, Disoriented to situation Attention: Sustained Sustained Attention: Impaired Sustained Attention Impairment: Verbal basic, Functional basic Memory: Impaired Memory Impairment: Storage deficit, Retrieval deficit Awareness: Impaired Awareness Impairment: Intellectual impairment Problem Solving: Impaired Problem Solving Impairment: Verbal basic Executive Function: Decision Making (impulsive) Safety/Judgment: Impaired Comments: impulsive    Extremity Assessment (includes Sensation/Coordination)  Upper Extremity Assessment: Defer to OT evaluation LUE Deficits / Details: decreased sensation, decreased  overall function. MMT revealed 3/5 grasp - strength seems to decrease distally down the arm LUE Sensation: decreased light touch, decreased proprioception LUE Coordination: decreased fine motor, decreased gross motor (fine >gross deficits)  Lower Extremity Assessment: RLE deficits/detail, LLE deficits/detail RLE Deficits / Details: Strength 5/5 LLE Deficits / Details: Strength 5/5     ADLs   Overall ADL's : Needs assistance/impaired Eating/Feeding: NPO Grooming: Wash/dry face, Moderate assistance, Sitting Upper Body Bathing: Moderate assistance Upper Body Bathing Details (indicate cue type and reason): for back Lower Body Bathing: Moderate assistance, Sitting/lateral leans Lower Body Bathing Details (indicate cue type and reason): multimodal cues to attend to left side Upper Body Dressing : Moderate assistance Upper Body Dressing Details (indicate cue type and reason): new gown Lower Body Dressing: Maximal assistance Toilet Transfer: Minimal assistance, +2 for safety/equipment, Ambulation Toilet Transfer Details (indicate cue type and reason): 2 person HHA, assist for initiation, safety with navigating environment Toileting- Clothing Manipulation and Hygiene: Moderate assistance, Sit to/from stand Functional mobility during ADLs: Minimal assistance, +2 for safety/equipment, +2 for physical assistance (2 person HHA) General ADL Comments: L inattention, L visual deficits, decreaseb balance, decreased cog     Mobility   Overal bed mobility: Needs Assistance Bed Mobility: Supine to Sit, Sit to Supine Supine to sit: Mod assist Sit to supine: Mod assist General bed mobility comments: multimodal cues for sequencing, assist for trunk elevation to come EOB and then assist for BLE back into bed for supine. assist for all aspects of line management     Transfers   Overall transfer level: Needs assistance Equipment used: 2 person hand held assist Transfers: Sit to/from Stand Sit to Stand: Min  assist, +2 physical assistance, +2 safety/equipment, From elevated surface (ED stretcher) General transfer comment: min A for boost and balance, more assist on the left side     Ambulation / Gait / Stairs / Wheelchair Mobility   Ambulation/Gait Ambulation/Gait assistance: Min assist, +2 safety/equipment Gait Distance (Feet): 15 Feet Assistive device: 1 person hand held assist Gait Pattern/deviations: Step-through pattern, Decreased stride length General Gait Details: Handheld assist for postural stability and environmental guidance, max multimodal cueing due to vision and left inattention Gait velocity: decreased Gait velocity interpretation: <1.31 ft/sec, indicative of household ambulator     Posture / Balance Dynamic Sitting Balance Sitting balance - Comments: unchallenged able to sit EOB with min guard close to supervision level Balance Overall balance assessment: Needs assistance Sitting-balance support: Feet supported, Single extremity supported Sitting balance-Leahy Scale: Fair Sitting balance - Comments: unchallenged able to sit EOB with min guard close to supervision level Standing balance support: Single extremity supported, Reliant on assistive device for balance Standing balance-Leahy Scale: Poor Standing balance comment: dependent on external assist from therapy team     Special needs/care consideration Diabetic management A1C 6.7    Previous Home Environment (from acute therapy documentation) Living Arrangements: Spouse/significant other Available Help at Discharge: Family Type of Home: House Home Layout: Two level, Able to live on main level with bedroom/bathroom Home Access: Level entry Bathroom Shower/Tub: Engineer, manufacturing systems: Standard Additional Comments: has been driving (stick shift) and wife checks on him via the 360 app   Discharge Living Setting Plans for Discharge Living Setting: Patient's home, Lives with (comment) (spouse, Trevor Fudge) Type of  Home at Discharge: House Discharge Home Layout: Able to live on main level with bedroom/bathroom Discharge Home Access: Level entry Discharge  Bathroom Shower/Tub: Hydrographic surveyor, Walk-in shower (walk in on second story) Discharge Bathroom Toilet: Standard Discharge Bathroom Accessibility: Yes How Accessible: Accessible via walker Does the patient have any problems obtaining your medications?: No   Social/Family/Support Systems Patient Roles: Spouse, Parent Contact Information: 2 adult daughters who live within a five minute radius Anticipated Caregiver: wife, Trevor Fudge Anticipated Industrial/product designer Information: [phone] Ability/Limitations of Caregiver: none stated Caregiver Availability: 24/7 Discharge Plan Discussed with Primary Caregiver: Yes Is Caregiver In Agreement with Plan?: Yes Does Caregiver/Family have Issues with Lodging/Transportation while Pt is in Rehab?: No   Goals Patient/Family Goal for Rehab: PT supervision, OT supervision to min assist, SLP supervision Expected length of stay: 10-14 days Additional Information: Discharge plan: home with spouse 24/7, daughters nearby and supportive as well Pt/Family Agrees to Admission and willing to participate: Yes Program Orientation Provided & Reviewed with Pt/Caregiver Including Roles  & Responsibilities: Yes   Decrease burden of Care through IP rehab admission: no   Possible need for SNF placement upon discharge: Not anticipated.  Plan for discharge home with spouse 24/7   Patient Condition: I have reviewed medical records from [facility], spoken with [facility] team, and patient and spouse. I met with patient at the bedside for inpatient rehabilitation assessment.  Patient will benefit from ongoing PT, OT, and SLP, can actively participate in 3 hours of therapy a day 5 days of the week, and can make measurable gains during the admission.  Patient will also benefit from the coordinated team approach during an Inpatient Acute  Rehabilitation admission.  The patient will receive intensive therapy as well as Rehabilitation physician, nursing, social worker, and care management interventions.  Due to safety, skin/wound care, disease management, medication administration, pain management, and patient education the patient requires 24 hour a day rehabilitation nursing.  The patient is currently min assist with mobility and mod to max basic ADLs.  Discharge setting and therapy post discharge at home with home health is anticipated.  Patient has agreed to participate in the Acute Inpatient Rehabilitation Program and will admit today.   Preadmission Screen Completed By:  Mickey Alar, PT, DPT 12/21/2023 4:26 PM ______________________________________________________________________   Discussed status with Dr. Rayleen Cal on 12/22/23  at 9:58 AM  and received approval for admission today.   Admission Coordinator:  Andrell Bergeson E Jesusa Stenerson, PT, time 9:58 AM Alanna Hu 12/22/23     Assessment/Plan: Diagnosis: CVA Does the need for close, 24 hr/day Medical supervision in concert with the patient's rehab needs make it unreasonable for this patient to be served in a less intensive setting? Yes Co-Morbidities requiring supervision/potential complications: HLD, HTN, Fever, mood disorder, Obesity Due to bladder management, bowel management, safety, skin/wound care, disease management, medication administration, pain management, and patient education, does the patient require 24 hr/day rehab nursing? Yes Does the patient require coordinated care of a physician, rehab nurse, PT, OT, and SLP to address physical and functional deficits in the context of the above medical diagnosis(es)? Yes Addressing deficits in the following areas: balance, endurance, locomotion, strength, transferring, bowel/bladder control, bathing, dressing, feeding, grooming, toileting, cognition, speech, language, swallowing, and psychosocial support Can the patient actively  participate in an intensive therapy program of at least 3 hrs of therapy 5 days a week? Yes The potential for patient to make measurable gains while on inpatient rehab is excellent Anticipated functional outcomes upon discharge from inpatient rehab: supervision PT, supervision and min assist OT, supervision SLP Estimated rehab length of stay to reach the above functional goals is:  10-14 Anticipated discharge destination: Home 10. Overall Rehab/Functional Prognosis: excellent     MD Signature: [name]         Revision History

## 2023-12-22 NOTE — Progress Notes (Signed)
 Patient ID: Tyler Guerra., male   DOB: Feb 17, 1951, 73 y.o.   MRN: [id number] INPATIENT REHABILITATION ADMISSION NOTE   Arrival Method: bed     Mental Orientation: alert and oriented x3 person place, time Intermittent confusion/restlessness    Assessment: completed see flowsheets    Skin: blanchable redness to buttocks/abdominal bruising  2nd nurrse Faby RN   IV'S: Left arm PIV   Pain: none   Tubes and Drains: none   Safety Measures: implemented   Vital Signs: obtained    Height and Weight: see flowsheets    Rehab Orientation: reviewed and in agreement-- family present    Family: present    Notes:

## 2023-12-22 NOTE — H&P (Signed)
 Physical Medicine and Rehabilitation Admission H&P    Chief Complaint  Patient presents with   Functional deficits due to stroke.     HPI:  Tyler Guerra. 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 Bosie Bye) who was admitted to [facility] on 12/19/23 with headache, mental status changes with difficulty following commands, left sided weakness with left gaze preference and was noted to be confused.  He had mild temp elevation in ED- 100.8 with lactate 2.4 and was started on IVF and empiric antibiotics. Wife questioned UTI and UA done negative. CXR with moderate low lung volumes. CTA head done showing  focal occlusion v/s severe near occlusive stenosis of right anterior cerebral artery A3 segment as well as calcified plaque resulting in moderate stenosis of paraclinoid R-ICA. No core infarct on perfusion scan. MRI brain done revealing 3 mm infarct right periartrial white matter with background parenchymal atrophy, small vessel disease and chronic infarcts left cerebral white matter. 2D echo from 05/13 showed EF 60-65% with mild to moderate aortic stenosis and mild LVH. EEG negative for seizures, suggestive of encephalopathy.   Dr. Janett Medin felt that stroke was due to small vessel disease and to continue ASA/Brillinta X 30 days followed by ASA alone. Loop recorder interrogated and was negative for A fib. Right inattention is improving but he continued to have low grade fevers which has resolved.  Antibiotics dc 05/20 as work up negative.  He has had issues with confusion since yesterday and CXR negative and WBC stable. He continues to be limited by weakness and cognitive deficits with delay in processing, is impulsive and has poor safety awareness. Therapy has been working with patient who requires min A.    Review of Systems  Constitutional:  Negative for chills and fever.  HENT:   Positive for hearing loss (Hard of hearing).   Eyes:  Positive for blurred vision. Negative for double vision.  Respiratory:  Negative for cough and shortness of breath.   Cardiovascular:  Negative for chest pain.  Gastrointestinal:  Positive for constipation (LMB today- improved) and nausea. Negative for abdominal pain, diarrhea and vomiting.  Genitourinary:  Positive for urgency.       Hesitancy  Musculoskeletal:  Positive for joint pain (R knee and hip pain).  Skin:  Negative for rash.  Neurological:  Positive for headaches. Negative for sensory change.    Past Medical History:  Diagnosis Date   Anxiety state 03/16/2016   Aortic stenosis    Mild to moderate by echo 12/2023 with mean aortic valve gradient 12 mmHg   Aphasia 01/26/2017   Benign localized prostatic hyperplasia with lower urinary tract symptoms (LUTS)    BPV (benign positional vertigo) 11/02/2014   Carotid artery stenosis    PER DUPLEX 10-22-2015  BILATERAL ICA 1-39%   Cataract    Coronary artery disease    CABG 2000 with LIMA> LAD, SVG > RCA and free radial > OM. 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   Gout    Gout 07/18/2010   Qualifier: Diagnosis of  By:  Vallarie Gauze MD, Tyrone Gallop     H/O eye injury    chronic changes to left eye after injury   Hand weakness 03/02/2018   Hearing loss 04/21/2013   History of TIA (transient ischemic attack)    03/ 2017  no residual after brief episode loss peripheral vision   HOH (hard of hearing)    Hyperglycemia 05/17/2016   Hyperlipemia    Hyperlipidemia 07/18/2010   Qualifier: Diagnosis of  By: Vallarie Gauze MD, Tyrone Gallop     Hypertension    HYPERTENSION, BENIGN ESSENTIAL 07/18/2010    Qualifier: Diagnosis of  By: Vallarie Gauze MD, Graham     Hypokalemia 01/26/2017   Knee pain 03/13/2019   Left shoulder pain 09/04/2018   Pneumothorax 10/11/2019   RIGHT    Prostate cancer (HCC) UROLOGIST-  DR GRAPEY/  ONCOLOGIST-  DR MANNING   dx 2015--- Stage T1c, Gleason 3+4, PSA 4.03, vol 44cc   PVC's (premature ventricular contractions)    Rash and nonspecific skin eruption 10/14/2015   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   S/P CABG x 05 Dec 1998   Skin lesion 10/14/2015   Stroke [facility])    TIA (transient ischemic attack) 10/14/2015   Ventral hernia 10/26/2011   Wears glasses     Past Surgical History:  Procedure Laterality Date   APPENDECTOMY  1975   CARDIAC CATHETERIZATION  12-22-2001   dr Kay Parson   severe native vessel disease mLAD 60-70%,  total occulsion pCFX  and pRCA/  widely patent saphenous vein , free radial , and LIMA grafts/  minminal lv dysfunction, ef 60%   CHEST TUBE INSERTION Right 10/11/2019   CORONARY ARTERY BYPASS GRAFT  12/1998   LIMA to LAD,  SVG to PDA and Diagonal, Free radial graft to OM   CORONARY STENT INTERVENTION N/A 05/26/2021   Procedure: CORONARY STENT INTERVENTION;  Surgeon: Kyra Phy, MD;  Location: [facility] INVASIVE CV LAB;  Service: Cardiovascular;  Laterality: N/A;   Exericse treadmill test  last one 01-03-2014  dr Kay Parson   normal exercise tolerance w/ hypertensive repsonse,  no ischemic EKG changes, appropriate HR response & recovery ([facility] TM score 9;  Low Risk , PVC's w/ exertion)   EYE SURGERY     FRACTURE SURGERY     LEFT HEART CATH AND CORS/GRAFTS ANGIOGRAPHY N/A 05/26/2021   Procedure: LEFT HEART CATH AND CORS/GRAFTS ANGIOGRAPHY;  Surgeon: Kyra Phy, MD;  Location: [facility] INVASIVE CV LAB;  Service: Cardiovascular;  Laterality: N/A;   LOOP RECORDER INSERTION N/A 12/22/2022   Procedure: LOOP RECORDER INSERTION;  Surgeon: Tammie Fall, MD;  Location: [facility] INVASIVE CV LAB;  Service: Cardiovascular;  Laterality: N/A;    PROSTATE BIOPSY     RADIOACTIVE SEED IMPLANT N/A 01/13/2016   Procedure: RADIOACTIVE SEED IMPLANT/BRACHYTHERAPY IMPLANT;  Surgeon: Ann Barnacle, MD;  Location: [facility];  Service: Urology;  Laterality: N/A;    Family History  Problem Relation Age of Onset   Dementia Mother    Heart disease Mother    Cancer Father        prostate in eighties   Prostate cancer Father    Heart disease Father    Stroke Father    Cancer Sister        breast   Anxiety disorder Sister    Colon cancer Maternal Grandfather    Cancer Paternal Uncle        prostate    Social History: Married. Independent PTA. 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 He reports that he has never smoked. He has never used smokeless tobacco. He reports that he used to drink alcohol  on rare occasions and does not use drugs.   Allergies: No Known Allergies   Medications Prior to Admission  Medication Sig Dispense Refill   allopurinol  (ZYLOPRIM ) 300 MG tablet Take 1 tablet (300 mg total) by mouth daily. 90 tablet 3   aspirin  EC 81 MG tablet Take 1 tablet (81 mg total) by mouth daily. 90 tablet 3   atorvastatin  (LIPITOR ) 80 MG tablet Take 1 tablet (80 mg total) by mouth daily. (Patient taking differently: Take 80 mg by mouth at bedtime.) 90 tablet 3   Cholecalciferol  (VITAMIN D  PO) Take 1 capsule by mouth daily.     escitalopram  (LEXAPRO ) 5 MG tablet Take 1 tablet (5 mg total) by mouth daily. 90 tablet 3   losartan  (COZAAR ) 25 MG tablet TAKE 1 TABLET DAILY 90 tablet 2   metoprolol  tartrate (LOPRESSOR ) 25 MG tablet Take 0.5 tablets (12.5 mg total) by mouth 2 (two) times daily.     Misc Natural Products (BLACK CHERRY CONCENTRATE PO) Take 1 tablet by mouth at bedtime.     Multiple Vitamins-Minerals (OCUVITE PO) Take 1 capsule by mouth daily.     nitroGLYCERIN  (NITROSTAT ) 0.4 MG SL tablet Place 1 tablet (0.4 mg total) under the tongue every 5 (five) minutes as needed for chest pain. 25 tablet 2   pantoprazole   (PROTONIX ) 40 MG tablet Take 1 tablet (40 mg total) by mouth daily. 90 tablet 3   polyethylene glycol powder (GLYCOLAX /MIRALAX ) 17 GM/SCOOP powder Take 17 g by mouth daily as needed for mild constipation. 238 g 0   Propylene Glycol (SYSTANE BALANCE) 0.6 % SOLN Place 1 drop into both eyes daily as needed (dry eyes).     sodium chloride  (OCEAN) 0.65 % SOLN nasal spray Place 1 spray into both nostrils as needed for congestion.     tamsulosin  (FLOMAX ) 0.4 MG CAPS capsule Take 1 capsule (0.4 mg total) by mouth daily. 90 capsule 3   ticagrelor  (BRILINTA ) 90 MG TABS tablet Take 1 tablet (90 mg total) by mouth 2 (two) times daily for 28 days.     Vitamin A 3 MG CAPS Take by mouth.          Home: Home Living Family/patient expects to be discharged to:: Private residence Living Arrangements: Spouse/significant other Available Help at Discharge: Family Type of Home: House Home Access: Level entry Home Layout: Two level, Able to live on main level with bedroom/bathroom Bathroom Shower/Tub: Engineer, manufacturing systems: Standard Home Equipment: Information systems manager, Medical laboratory scientific officer - single point, Other (comment) (Duet Rollator that converts to transport chair) Additional Comments: has been driving (stick shift) and wife checks on him via the 360 app   Functional History: Prior Function Prior Level of Function : Needs assist Mobility Comments: independent ADLs Comments: pt spouse manages bills, medications   Functional Status:  Mobility: Bed Mobility Overal bed mobility: Needs Assistance Bed Mobility: Supine to Sit, Sit to Supine Supine to sit: Mod assist Sit to supine: Mod assist General bed mobility comments: multimodal cues for sequencing, assist for trunk elevation to come EOB and then assist for BLE back into bed for supine. assist for all aspects of line management Transfers Overall transfer level: Needs assistance Equipment used: 2 person hand held assist Transfers: Sit to/from Stand Sit to  Stand: Min assist, +2 physical assistance, +2 safety/equipment, From elevated surface (ED stretcher) General transfer comment: min A  for boost and balance, more assist on the left side Ambulation/Gait Ambulation/Gait assistance: Min assist, +2 safety/equipment Gait Distance (Feet): 15 Feet Assistive device: 1 person hand held assist Gait Pattern/deviations: Step-through pattern, Decreased stride length General Gait Details: Handheld assist for postural stability and environmental guidance, max multimodal cueing due to vision and left inattention Gait velocity: decreased Gait velocity interpretation: <1.31 ft/sec, indicative of household ambulator   ADL: ADL Overall ADL's : Needs assistance/impaired Eating/Feeding: NPO Grooming: Wash/dry face, Moderate assistance, Sitting Upper Body Bathing: Moderate assistance Upper Body Bathing Details (indicate cue type and reason): for back Lower Body Bathing: Moderate assistance, Sitting/lateral leans Lower Body Bathing Details (indicate cue type and reason): multimodal cues to attend to left side Upper Body Dressing : Moderate assistance Upper Body Dressing Details (indicate cue type and reason): new gown Lower Body Dressing: Maximal assistance Toilet Transfer: Minimal assistance, +2 for safety/equipment, Ambulation Toilet Transfer Details (indicate cue type and reason): 2 person HHA, assist for initiation, safety with navigating environment Toileting- Clothing Manipulation and Hygiene: Moderate assistance, Sit to/from stand Functional mobility during ADLs: Minimal assistance, +2 for safety/equipment, +2 for physical assistance (2 person HHA) General ADL Comments: L inattention, L visual deficits, decreaseb balance, decreased cog   Cognition: Cognition Overall Cognitive Status: No family/caregiver present to determine baseline cognitive functioning (suspect different) Arousal/Alertness: Awake/alert Orientation Level: Oriented to person, Oriented  to place, Oriented to situation, Disoriented to time Attention: Sustained Sustained Attention: Impaired Sustained Attention Impairment: Verbal basic, Functional basic Memory: Impaired Memory Impairment: Storage deficit, Retrieval deficit Awareness: Impaired Awareness Impairment: Intellectual impairment Problem Solving: Impaired Problem Solving Impairment: Verbal basic Executive Function: Decision Making (impulsive) Safety/Judgment: Impaired Comments: impulsive Cognition Arousal: Alert Behavior During Therapy: Flat affect Overall Cognitive Status: No family/caregiver present to determine baseline cognitive functioning (suspect different)     Blood pressure (!) 150/94, pulse 75, temperature 98.5 F (36.9 C), temperature source Oral, resp. rate 17, height 5\' 10"  (1.778 m), weight 92.4 kg, SpO2 97%.   General: No apparent distress,Restless and attempted to get OOB twice to void.   HEENT: Head is normocephalic, atraumatic, sclera anicteric, oral mucosa pink and moist, dentition  decreased- reports he wears dentures, + glasses Neck: Supple without JVD or lymphadenopathy Heart: Reg rate and rhythm. No murmurs rubs or gallops Chest: CTA bilaterally without wheezes, rales, or rhonchi; no distress Abdomen: Soft, non-tender, non-distended, bowel sounds positive. Extremities: No clubbing, cyanosis, or edema. Pulses are 2+ Psych: Pt's affect is appropriate. Pt is cooperative Skin: Clean and intact without signs of breakdown Neuro:    Mental Status: AAO to May 2025, note date, not city, Hospital, knows its Wednesday, decreased memory, Able to name president, able to provide his birthday. Delayed responses, Poor safety awareness. Distractible and poor attention. Confused.  R gaze preference  Speech/Languate: Naming and repetition intact, fluent, follows simple commands but has more difficulty with multi step or complex commands CRANIAL NERVES: II: PERRL. Visual fields full III, IV, VI: EOM  overall intact, requires multiple attempts to complete V: normal sensation bilaterally VII: Mild left facial weakness  VIII: Hard of hearing IX, X: normal palatal elevation XI: 5/5 head turn and 5/5 shoulder shrug bilaterally XII: Tongue midline   MOTOR: RUE: 5/5 Deltoid, 5/5 Biceps, 5/5 Triceps,5/5 Grip LUE: 5/5 Deltoid, 4+/5 Biceps, 4+/5 Triceps, 4+/5 Grip RLE: HF 5/5, KE 5/5, ADF 5/5, APF 5/5 LLE: HF 5/5, KE 5/5, ADF 5/5, APF 5/5  Possible left LE apraxia   REFLEXES: No ankle clonus  SENSORY: Normal to  touch all 4 extremities  No hypertonia noted   Coordination: Normal finger to nose altered LUE   Results for orders placed or performed during the hospital encounter of 12/22/23 (from the past 48 hours)  Glucose, capillary     Status: Abnormal   Collection Time: 12/22/23  5:20 PM  Result Value Ref Range   Glucose-Capillary 113 (H) 70 - 99 mg/dL    Comment: Glucose reference range applies only to samples taken after fasting for at least 8 hours.   DG CHEST PORT 1 VIEW Result Date: 12/21/2023 CLINICAL DATA:  Fever. EXAM: PORTABLE CHEST 1 VIEW COMPARISON:  Dec 19, 2023. FINDINGS: Stable cardiomediastinal silhouette. Status post coronary bypass graft. Lungs are clear. Bony thorax is unremarkable. IMPRESSION: No active disease. Electronically Signed   By: Rosalene Colon M.D.   On: 12/21/2023 10:23      Blood pressure (!) 150/94, pulse 75, temperature 98.5 F (36.9 C), temperature source Oral, resp. rate 17, height 5\' 10"  (1.778 m), weight 92.4 kg, SpO2 97%.  Medical Problem List and Plan: 1. Functional deficits secondary to acute CVA R parietal periventricular white matter   -patient may shower  -ELOS/Goals: 10-14 days, Sup to min A OT, Sup PT, Sup SLP  -Admit to CIR 2.  Antithrombotics: -DVT/anticoagulation:  Pharmaceutical: Lovenox - -antiplatelet therapy:  ASA and Ticagrelor  X 4 weeks followed by ASA alone 3. Pain Management: Tylenol  prn.  4. Mood/Behavior/Sleep:  LCSW to follow for evaluation and support.   -antipsychotic agents: N/A  -Continue lexapro   5. Neuropsych/cognition: This patient may be intermittently capable of making decisions on his own behalf. 6. Skin/Wound Care: Routine pressure relief measures.  7. Fluids/Electrolytes/Nutrition: Monitor I/O. Check CMET in am 8. Delirium: Per family has had issues with confusion since yesterday. Low grade fevers are resolving  --Work up 05/20-->CXR- neg. Procal <0.10  WBC 7.1  --Monitor sleep wake disruption (has not slept for more than 24 hours per wife). Tele sitter ordered for fall prevention. Seroquel added to help with sleep. .  9. HTN: Monitor BP TID--remains labile. Monitor with activity.  --Continue Cozaar  and avoid hypotension due to intracranial atherosclerosis.  10. T2DM: Hgb A1C- 6.7. Monitor BS ac/hs and use SSI for elevated BS  --CM restrictions added to diet.  11. Hyperlipidemia: LDL @ goal. Continue Lipitor  80 mg 12. Prostate CA: Was on Flomax  0.8 mg till a few months ago.  --Has had hesitancy recently. Hx of retention in the past due to UTI.   --will monitor PVR w/bladder scan. Check UA  -Continue flomax , currently 0.4mg  daily 13. Class 1 Obesity: Educated on importance of activity and weight loss Body mass index is 29.23 kg/m. 14. H/o gout: STable on allopurinol . 15. Mood disorder: On Lexapro .     Zelda Hickman, PA-C 12/22/2023  I have personally performed a face to face diagnostic evaluation of this patient and formulated the key components of the plan.  Additionally, I have personally reviewed laboratory data, imaging studies, as well as relevant notes and concur with the physician assistant's documentation above.  The patient's status has not changed from the original H&P.  Any changes in documentation from the acute care chart have been noted above.  [name], MD

## 2023-12-22 NOTE — Plan of Care (Signed)
   Problem: Coping: Goal: Will verbalize positive feelings about self Outcome: Progressing

## 2023-12-23 DIAGNOSIS — I639 Cerebral infarction, unspecified: Secondary | ICD-10-CM | POA: Diagnosis not present

## 2023-12-23 LAB — CBC WITH DIFFERENTIAL/PLATELET
Abs Immature Granulocytes: 0.02 10*3/uL (ref 0.00–0.07)
Basophils Absolute: 0 10*3/uL (ref 0.0–0.1)
Basophils Relative: 0 %
Eosinophils Absolute: 0.1 10*3/uL (ref 0.0–0.5)
Eosinophils Relative: 2 %
HCT: 38.4 % — ABNORMAL LOW (ref 39.0–52.0)
Hemoglobin: 13.2 g/dL (ref 13.0–17.0)
Immature Granulocytes: 0 %
Lymphocytes Relative: 29 %
Lymphs Abs: 1.8 10*3/uL (ref 0.7–4.0)
MCH: 30.1 pg (ref 26.0–34.0)
MCHC: 34.4 g/dL (ref 30.0–36.0)
MCV: 87.7 fL (ref 80.0–100.0)
Monocytes Absolute: 0.5 10*3/uL (ref 0.1–1.0)
Monocytes Relative: 7 %
Neutro Abs: 3.9 10*3/uL (ref 1.7–7.7)
Neutrophils Relative %: 62 %
Platelets: 174 10*3/uL (ref 150–400)
RBC: 4.38 MIL/uL (ref 4.22–5.81)
RDW: 12.8 % (ref 11.5–15.5)
WBC: 6.4 10*3/uL (ref 4.0–10.5)
nRBC: 0 % (ref 0.0–0.2)

## 2023-12-23 LAB — COMPREHENSIVE METABOLIC PANEL WITH GFR
ALT: 27 U/L (ref 0–44)
AST: 38 U/L (ref 15–41)
Albumin: 3.6 g/dL (ref 3.5–5.0)
Alkaline Phosphatase: 83 U/L (ref 38–126)
Anion gap: 8 (ref 5–15)
BUN: 15 mg/dL (ref 8–23)
CO2: 24 mmol/L (ref 22–32)
Calcium: 9.4 mg/dL (ref 8.9–10.3)
Chloride: 109 mmol/L (ref 98–111)
Creatinine, Ser: 1.18 mg/dL (ref 0.61–1.24)
GFR, Estimated: >60 mL/min (ref >60)
Glucose, Bld: 113 mg/dL — ABNORMAL HIGH (ref 70–99)
Potassium: 3.3 mmol/L — ABNORMAL LOW (ref 3.5–5.1)
Sodium: 141 mmol/L (ref 135–145)
Total Bilirubin: 1 mg/dL (ref 0.0–1.2)
Total Protein: 6.6 g/dL (ref 6.5–8.1)

## 2023-12-23 LAB — URINALYSIS, ROUTINE W REFLEX MICROSCOPIC
Bacteria, UA: NONE SEEN
Bilirubin Urine: NEGATIVE
Glucose, UA: NEGATIVE mg/dL
Ketones, ur: NEGATIVE mg/dL
Leukocytes,Ua: NEGATIVE
Nitrite: NEGATIVE
Protein, ur: NEGATIVE mg/dL
Specific Gravity, Urine: 1.005 (ref 1.005–1.030)
pH: 7 (ref 5.0–8.0)

## 2023-12-23 LAB — GLUCOSE, CAPILLARY
Glucose-Capillary: 103 mg/dL — ABNORMAL HIGH (ref 70–99)
Glucose-Capillary: 113 mg/dL — ABNORMAL HIGH (ref 70–99)
Glucose-Capillary: 80 mg/dL (ref 70–99)
Glucose-Capillary: 126 mg/dL — ABNORMAL HIGH (ref 70–99)

## 2023-12-23 LAB — URINE CULTURE
Culture: NO GROWTH
Special Requests: NORMAL

## 2023-12-23 MED ORDER — POTASSIUM CHLORIDE CRYS ER 20 MEQ PO TBCR
40.0000 meq | EXTENDED_RELEASE_TABLET | Freq: Once | ORAL | Status: AC
  Administered 2023-12-23: 40 meq via ORAL
  Filled 2023-12-23: qty 2

## 2023-12-23 MED ORDER — AMLODIPINE BESYLATE 2.5 MG PO TABS
2.5000 mg | ORAL_TABLET | Freq: Every day | ORAL | Status: DC
  Administered 2023-12-23 – 2023-12-27 (×5): 2.5 mg via ORAL
  Filled 2023-12-23 (×5): qty 1

## 2023-12-23 NOTE — Evaluation (Signed)
 Occupational Therapy Assessment and Plan  Patient Details  Name: Tyler Guerra. MRN: [id number] Date of Birth: May 22, 1951  OT Diagnosis: altered mental status, cognitive deficits, and muscle weakness (generalized) Rehab Potential: Rehab Potential (ACUTE ONLY): Good ELOS: 7-10 days   Today's Date: 12/23/2023 OT Individual Time: 6962-9528 OT Individual Time Calculation (min): 75 min     Hospital Problem: Principal Problem:   Stroke, small vessel (HCC)   Past Medical History:  Past Medical History:  Diagnosis Date   Anxiety state 03/16/2016   Aortic stenosis    Mild to moderate by echo 12/2023 with mean aortic valve gradient 12 mmHg   Aphasia 01/26/2017   Benign localized prostatic hyperplasia with lower urinary tract symptoms (LUTS)    BPV (benign positional vertigo) 11/02/2014   Carotid artery stenosis    PER DUPLEX 10-22-2015  BILATERAL ICA 1-39%   Cataract    Coronary artery disease    CABG 2000 with LIMA> LAD, SVG > RCA and free radial > OM. S/P NSTEMI 05/2021 cath with luiminal Irrg SVG>RCA, occluded prox RCA, patent free radial>OM, occluded oLCx, occluded o1   Depression 01/26/2017   Diabetes mellitus without complication (HCC) 03/02/2018   GERD 07/19/2010   Qualifier: Diagnosis of  By: Tyler Gauze MD, Tyler Guerra     GERD (gastroesophageal reflux disease)    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   Gout    Gout 07/18/2010   Qualifier: Diagnosis of  By: Tyler Gauze MD, Tyler Guerra     H/O eye injury    chronic changes to left eye after injury   Hand weakness 03/02/2018   Hearing loss 04/21/2013   History of TIA (transient ischemic attack)    03/ 2017  no residual after brief episode loss peripheral vision   HOH (hard of hearing)    Hyperglycemia 05/17/2016   Hyperlipemia    Hyperlipidemia 07/18/2010   Qualifier: Diagnosis of  By: Tyler Gauze MD, Tyler Guerra     Hypertension    HYPERTENSION, BENIGN ESSENTIAL 07/18/2010   Qualifier: Diagnosis of  By: Tyler Gauze MD, Tyler Guerra     Hypokalemia 01/26/2017    Knee pain 03/13/2019   Left shoulder pain 09/04/2018   Pneumothorax 10/11/2019   RIGHT    Prostate cancer (HCC) UROLOGIST-  Tyler Guerra/  ONCOLOGIST-  Tyler Guerra   dx 2015--- Stage T1c, Gleason 3+4, PSA 4.03, vol 44cc   PVC's (premature ventricular contractions)    Rash and nonspecific skin eruption 10/14/2015   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   S/P CABG x 05 Dec 1998   Skin lesion 10/14/2015   Stroke [facility])    TIA (transient ischemic attack) 10/14/2015   Ventral hernia 10/26/2011   Wears glasses    Past Surgical History:  Past Surgical History:  Procedure Laterality Date   APPENDECTOMY  1975   CARDIAC CATHETERIZATION  12-22-2001   Tyler Tyler Guerra   severe native vessel disease mLAD 60-70%,  total occulsion pCFX  and pRCA/  widely patent saphenous vein , free radial , and LIMA grafts/  minminal lv dysfunction, ef 60%   CHEST TUBE INSERTION Right 10/11/2019   CORONARY ARTERY BYPASS GRAFT  12/1998   LIMA to LAD,  SVG to PDA and Diagonal, Free radial graft to OM   CORONARY STENT INTERVENTION N/A 05/26/2021   Procedure: CORONARY STENT INTERVENTION;  Surgeon: Tyler Phy, MD;  Location: [facility];  Service: Cardiovascular;  Laterality: N/A;   Exericse treadmill test  last one 01-03-2014  Tyler Tyler Guerra  normal exercise tolerance w/ hypertensive repsonse,  no ischemic EKG changes, appropriate HR response & recovery ([facility] TM score 9;  Low Risk , PVC's w/ exertion)   EYE SURGERY     FRACTURE SURGERY     LEFT HEART CATH AND CORS/GRAFTS ANGIOGRAPHY N/A 05/26/2021   Procedure: LEFT HEART CATH AND CORS/GRAFTS ANGIOGRAPHY;  Surgeon: Tyler Phy, MD;  Location: [facility];  Service: Cardiovascular;  Laterality: N/A;   LOOP RECORDER INSERTION N/A 12/22/2022   Procedure: LOOP RECORDER INSERTION;  Surgeon: Tyler Fall, MD;  Location: [facility];  Service: Cardiovascular;  Laterality: N/A;   PROSTATE BIOPSY     RADIOACTIVE SEED IMPLANT N/A  01/13/2016   Procedure: RADIOACTIVE SEED IMPLANT/BRACHYTHERAPY IMPLANT;  Surgeon: Tyler Barnacle, MD;  Location: [facility];  Service: Urology;  Laterality: N/A;    Assessment & Plan Clinical Impression:Tyler Guerra is a 73 year old male with history of HTN, CAD s/p CABG, cardiac stent, R-ACA  CVA 2024 w/some residual left knee weakness, prostate CA s/p radiation seed implant (Tyler Guerra) who was admitted to [facility] on 12/19/23 with headache, mental status changes with difficulty following commands, left sided weakness with left gaze preference and was noted to be confused.  He had mild temp elevation in ED- 100.8 with lactate 2.4 and was started on IVF and empiric antibiotics. Wife questioned UTI and UA done negative. CXR with moderate low lung volumes. CTA head done showing  focal occlusion v/s severe near occlusive stenosis of right anterior cerebral artery A3 segment as well as calcified plaque resulting in moderate stenosis of paraclinoid R-ICA. No core infarct on perfusion scan. MRI brain done revealing 3 mm infarct right periartrial white matter with background parenchymal atrophy, small vessel disease and chronic infarcts left cerebral white matter. 2D echo from 05/13 showed EF 60-65% with mild to moderate aortic stenosis and mild LVH. EEG negative for seizures, suggestive of encephalopathy.    Tyler Guerra felt that stroke was due to small vessel disease and to continue ASA/Brillinta X 30 days followed by ASA alone. Loop recorder interrogated and was negative for A fib. Right inattention is improving but he continued to have low grade fevers which has resolved.  Antibiotics dc 05/20 as work up negative.  He has had issues with confusion since yesterday and CXR negative and WBC stable. He continues to be limited by weakness and cognitive deficits with delay in processing, is impulsive and has poor safety awareness. Therapy has been working with patient who requires min A.   .  Patient  transferred to CIR on 12/22/2023 .    Patient currently requires CGA with basic self-care skills secondary to muscle weakness, decreased cardiorespiratoy endurance, decreased motor planning, decreased awareness, decreased problem solving, decreased safety awareness, , and decreased standing balance, decreased postural control, and decreased balance strategies.  Prior to hospitalization, patient could complete ADLs with modified independent .  Patient will benefit from skilled intervention to decrease level of assist with basic self-care skills and increase independence with basic self-care skills prior to discharge home with care partner.  Anticipate patient will require 24 hour supervision and follow up outpatient.  OT - End of Session Activity Tolerance: Tolerates 30+ min activity with multiple rests Endurance Deficit: Yes OT Assessment Rehab Potential (ACUTE ONLY): Good OT Patient demonstrates impairments in the following area(s): Balance;Cognition;Endurance;Safety OT Basic ADL's Functional Problem(s): Eating;Grooming;Bathing;Dressing;Toileting OT Transfers Functional Problem(s): Toilet;Tub/Shower OT Additional Impairment(s): None OT Plan OT Intensity: Minimum of  1-2 x/day, 45 to 90 minutes OT Frequency: 5 out of 7 days OT Duration/Estimated Length of Stay: 7-10 days OT Treatment/Interventions: Balance/vestibular training;DME/adaptive equipment instruction;Patient/family education;Therapeutic Activities;Wheelchair propulsion/positioning;Cognitive remediation/compensation;Functional electrical stimulation;Psychosocial support;Therapeutic Exercise;Community reintegration;Functional mobility training;Self Care/advanced ADL retraining;UE/LE Strength taining/ROM;Discharge planning;Neuromuscular re-education;Skin care/wound managment;UE/LE Coordination activities;Disease mangement/prevention;Pain management;Splinting/orthotics;Visual/perceptual remediation/compensation OT Self Feeding Anticipated  Outcome(s): set up OT Basic Self-Care Anticipated Outcome(s): SBA OT Toileting Anticipated Outcome(s): SBA OT Bathroom Transfers Anticipated Outcome(s): SBA OT Recommendation Recommendations for Other Services: Speech consult;Therapeutic Recreation consult Therapeutic Recreation Interventions: Pet therapy Patient destination: Home Follow Up Recommendations: Outpatient OT;24 hour supervision/assistance Equipment Recommended: To be determined   OT Evaluation Precautions/Restrictions  Precautions Precautions: Guerra Recall of Precautions/Restrictions: Impaired Precaution/Restrictions Comments: pt is impulsive Restrictions Weight Bearing Restrictions Per Provider Order: No General Chart Reviewed: Yes Response to Previous Treatment: Not applicable Family/Caregiver Present: Yes (spouse) Pain Pain Assessment Pain Scale: 0-10 Pain Score: 0-No pain Home Living/Prior Functioning Home Living Available Help at Discharge: Family Type of Home: House Home Access: Level entry Home Layout: Two level, Able to live on main level with bedroom/bathroom Alternate Level Stairs-Number of Steps: 1 flight Alternate Level Stairs-Rails: Right Bathroom Shower/Tub: Armed forces operational officer Accessibility: Yes  Lives With: Spouse IADL History Homemaking Responsibilities: No Current License: Yes (rarely since last stroke) Occupation: Retired Advertising account planner: travel Prior Function Level of Independence: Independent with basic ADLs, Independent with gait, Independent with transfers  Able to Take Stairs?: Yes Driving: Yes (rarely since last stroke) Vocation: Retired Administrator, sports Baseline Vision/History: 1 Wears glasses Ability to See in Adequate Light: 1 Impaired Patient Visual Report: Eye fatigue/eye pain/headache Vision Assessment?: Yes Eye Alignment: Within Functional Limits Ocular Range of Motion: Restricted on the left;Impaired-to be further tested in functional  context Tracking/Visual Pursuits: Decreased smoothness of horizontal tracking;Requires cues, head turns, or add eye shifts to track;Impaired - to be further tested in functional context Saccades: Impaired - to be further tested in functional context Visual Fields: Left homonymous hemianopsia;Impaired-to be further tested in functional context Perception  Perception: Impaired Praxis Praxis: Impaired Praxis Impairment Details: Initiation Cognition Cognition Overall Cognitive Status: Impaired/Different from baseline Orientation Level: Person;Place;Situation Memory: Appears intact Sustained Attention: Impaired Awareness: Impaired Problem Solving: Impaired Behaviors: Restless;Impulsive Safety/Judgment: Impaired Brief Interview for Mental Status (BIMS) Repetition of Three Words (First Attempt): 3 Temporal Orientation: Year: Correct Temporal Orientation: Month: Accurate within 5 days Temporal Orientation: Day: Correct Recall: "Sock": Yes, after cueing ("something to wear") Recall: "Blue": Yes, no cue required Recall: "Bed": Yes, after cueing ("a piece of furniture") BIMS Summary Score: 13 Sensation Sensation Light Touch: Appears Intact Hot/Cold: Appears Intact Proprioception: Appears Intact Stereognosis: Not tested Coordination Gross Motor Movements are Fluid and Coordinated: Yes Fine Motor Movements are Fluid and Coordinated: No (Lt have grip deficits and delayed from previous stroke) Motor  Motor Motor: Other (comment) Motor - Skilled Clinical Observations: generalized weakness, some residual weakness in Lt hand from prior stroke  Trunk/Postural Assessment  Cervical Assessment Cervical Assessment: Within Functional Limits Thoracic Assessment Thoracic Assessment: Within Functional Limits Lumbar Assessment Lumbar Assessment: Within Functional Limits Postural Control Postural Control: Deficits on evaluation (delayed processing with motor movements)  Balance Balance Balance  Assessed: Yes Static Sitting Balance Static Sitting - Balance Support: Feet supported;No upper extremity supported Static Sitting - Level of Assistance: 6: Modified independent (Device/Increase time) Dynamic Sitting Balance Dynamic Sitting - Balance Support: During functional activity Dynamic Sitting - Level of Assistance: 5: Stand by assistance Dynamic Standing Balance Dynamic Standing - Balance Support: During functional  activity Dynamic Standing - Level of Assistance: 4: Min assist (CGA) Extremity/Trunk Assessment RUE Assessment RUE Assessment: Within Functional Limits Active Range of Motion (AROM) Comments: WFL General Strength Comments: 4+/5 overall LUE Assessment LUE Assessment: Exceptions to [facility] General Strength Comments: 4+/5 overall, residual weakness in Lt grip  Care Tool Care Tool Self Care Eating   Eating Assist Level: Set up assist    Oral Care    Oral Care Assist Level: Supervision/Verbal cueing    Bathing         Assist Level: Contact Guard/Touching assist    Upper Body Dressing(including orthotics)       Assist Level: Supervision/Verbal cueing    Lower Body Dressing (excluding footwear)     Assist for lower body dressing: Contact Guard/Touching assist    Putting on/Taking off footwear     Assist for footwear: Supervision/Verbal cueing       Care Tool Toileting Toileting activity   Assist for toileting: Contact Guard/Touching assist     Care Tool Bed Mobility Roll left and right activity   Roll left and right assist level: Supervision/Verbal cueing    Sit to lying activity   Sit to lying assist level: Supervision/Verbal cueing    Lying to sitting on side of bed activity   Lying to sitting on side of bed assist level: the ability to move from lying on the back to sitting on the side of the bed with no back support.: Supervision/Verbal cueing     Care Tool Transfers Sit to stand transfer   Sit to stand assist level: Contact Guard/Touching  assist    Chair/bed transfer   Chair/bed transfer assist level: Contact Guard/Touching assist     Toilet transfer   Assist Level: Contact Guard/Touching assist     Care Tool Cognition  Expression of Ideas and Wants Expression of Ideas and Wants: 3. Some difficulty - exhibits some difficulty with expressing needs and ideas (e.g, some words or finishing thoughts) or speech is not clear  Understanding Verbal and Non-Verbal Content Understanding Verbal and Non-Verbal Content: 3. Usually understands - understands most conversations, but misses some part/intent of message. Requires cues at times to understand   Memory/Recall Ability Memory/Recall Ability : Current season;That he or she is in a hospital/hospital unit;Staff names and faces   Refer to Care Plan for Long Term Goals  SHORT TERM GOAL WEEK 1 OT Short Term Goal 1 (Week 1): STG=LTG d/t ELOS  Recommendations for other services: Therapeutic Recreation  Pet therapy   Skilled Therapeutic Intervention ADL ADL Eating: Set up Grooming: Supervision/safety Where Assessed-Grooming: Sitting at sink Upper Body Bathing: Supervision/safety Where Assessed-Upper Body Bathing: Shower Lower Body Bathing: Contact guard Where Assessed-Lower Body Bathing: Shower Upper Body Dressing: Supervision/safety Where Assessed-Upper Body Dressing: Edge of bed Lower Body Dressing: Contact guard Where Assessed-Lower Body Dressing: Edge of bed Toileting: Contact guard Where Assessed-Toileting: Bedside Commode;Teacher, adult education: Furniture conservator/restorer Method: Therapist, occupational: Walk in shower;Grab Clinical research associate: Administrator, arts Method: Designer, industrial/product: Grab bars ADL Comments: Pt required VC for safety during ADL tasks d/t impulsivity Mobility  Bed Mobility Bed Mobility: Supine to Sit Supine to Sit:  Supervision/Verbal cueing Transfers Sit to Stand: Contact Guard/Touching assist Stand to Sit: Contact Guard/Touching assist  1:1 evaluation and treatment session initiated this date. OT roles, goals and purpose discussed with pt as well as therapy schedule. Wife present fr eval, able to assist answering questions.  ADL completed this date with levels of assist listed above. Pt requiring VC for safety awareness throughout session and with management of RW. Pt would benefit from skilled OT in IPR setting in order to maximize independence with ADLs upon D/C.    Discharge Criteria: Patient will be discharged from OT if patient refuses treatment 3 consecutive times without medical reason, if treatment goals not met, if there is a change in medical status, if patient makes no progress towards goals or if patient is discharged from hospital.  The above assessment, treatment plan, treatment alternatives and goals were discussed and mutually agreed upon: by patient  Nila Barth, OTD, OTR/L 12/23/2023, 1:23 PM

## 2023-12-23 NOTE — Evaluation (Signed)
 Physical Therapy Assessment and Plan  Patient Details  Name: Tyler Guerra. MRN: [id number] Date of Birth: 10-28-1950  PT Diagnosis: Coordination disorder, Difficulty walking, and Impaired cognition Rehab Potential: Good ELOS: 7-10 days   Today's Date: 12/23/2023 PT Individual Time: 1330-1430 PT Individual Time Calculation (min): 60 min    Hospital Problem: Principal Problem:   Stroke, small vessel (HCC)   Past Medical History:  Past Medical History:  Diagnosis Date   Anxiety state 03/16/2016   Aortic stenosis    Mild to moderate by echo 12/2023 with mean aortic valve gradient 12 mmHg   Aphasia 01/26/2017   Benign localized prostatic hyperplasia with lower urinary tract symptoms (LUTS)    BPV (benign positional vertigo) 11/02/2014   Carotid artery stenosis    PER DUPLEX 10-22-2015  BILATERAL ICA 1-39%   Cataract    Coronary artery disease    CABG 2000 with LIMA> LAD, SVG > RCA and free radial > OM. 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   Gout    Gout 07/18/2010   Qualifier: Diagnosis of  By: Vallarie Gauze MD, Tyrone Gallop     H/O eye injury    chronic changes to left eye after injury   Hand weakness 03/02/2018   Hearing loss 04/21/2013   History of TIA (transient ischemic attack)    03/ 2017  no residual after brief episode loss peripheral vision   HOH (hard of hearing)    Hyperglycemia 05/17/2016   Hyperlipemia    Hyperlipidemia 07/18/2010   Qualifier: Diagnosis of  By: Vallarie Gauze MD, Tyler Guerra     Hypertension    HYPERTENSION, BENIGN ESSENTIAL 07/18/2010   Qualifier: Diagnosis of  By: Vallarie Gauze MD, Tyler Guerra     Hypokalemia 01/26/2017   Knee pain 03/13/2019   Left shoulder pain  09/04/2018   Pneumothorax 10/11/2019   RIGHT    Prostate cancer (HCC) UROLOGIST-  DR GRAPEY/  ONCOLOGIST-  DR Tyler Guerra   dx 2015--- Stage T1c, Gleason 3+4, PSA 4.03, vol 44cc   PVC's (premature ventricular contractions)    Rash and nonspecific skin eruption 10/14/2015   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   S/P CABG x 05 Dec 1998   Skin lesion 10/14/2015   Stroke [facility])    TIA (transient ischemic attack) 10/14/2015   Ventral hernia 10/26/2011   Wears glasses    Past Surgical History:  Past Surgical History:  Procedure Laterality Date   APPENDECTOMY  1975   CARDIAC CATHETERIZATION  12-22-2001   dr Kay Parson   severe native vessel disease mLAD 60-70%,  total occulsion pCFX  and pRCA/  widely patent saphenous vein , free radial , and LIMA grafts/  minminal lv dysfunction, ef 60%   CHEST TUBE INSERTION Right 10/11/2019   CORONARY ARTERY BYPASS GRAFT  12/1998   LIMA to LAD,  SVG to PDA and Diagonal, Free radial graft to OM   CORONARY STENT INTERVENTION N/A 05/26/2021   Procedure: CORONARY STENT INTERVENTION;  Surgeon: Kyra Phy, MD;  Location: [facility];  Service: Cardiovascular;  Laterality: N/A;   Exericse treadmill test  last one 01-03-2014  dr Kay Parson   normal exercise tolerance w/ hypertensive  repsonse,  no ischemic EKG changes, appropriate HR response & recovery ([facility] TM score 9;  Low Risk , PVC's w/ exertion)   EYE SURGERY     FRACTURE SURGERY     LEFT HEART CATH AND CORS/GRAFTS ANGIOGRAPHY N/A 05/26/2021   Procedure: LEFT HEART CATH AND CORS/GRAFTS ANGIOGRAPHY;  Surgeon: Kyra Phy, MD;  Location: [facility];  Service: Cardiovascular;  Laterality: N/A;   LOOP RECORDER INSERTION N/A 12/22/2022   Procedure: LOOP RECORDER INSERTION;  Surgeon: Tammie Fall, MD;  Location: [facility];  Service: Cardiovascular;  Laterality: N/A;   PROSTATE BIOPSY     RADIOACTIVE SEED IMPLANT N/A 01/13/2016   Procedure: RADIOACTIVE SEED  IMPLANT/BRACHYTHERAPY IMPLANT;  Surgeon: Ann Barnacle, MD;  Location: [facility];  Service: Urology;  Laterality: N/A;    Assessment & Plan Clinical Impression: Tyler Guerra. 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 Bosie Bye) who was admitted to [facility] on 12/19/23 with headache, mental status changes with difficulty following commands, left sided weakness with left gaze preference and was noted to be confused.  He had mild temp elevation in ED- 100.8 with lactate 2.4 and was started on IVF and empiric antibiotics. Wife questioned UTI and UA done negative. CXR with moderate low lung volumes. CTA head done showing  focal occlusion v/s severe near occlusive stenosis of right anterior cerebral artery A3 segment as well as calcified plaque resulting in moderate stenosis of paraclinoid R-ICA. No core infarct on perfusion scan. MRI brain done revealing 3 mm infarct right periartrial white matter with background parenchymal atrophy, small vessel disease and chronic infarcts left cerebral white matter. 2D echo from 05/13 showed EF 60-65% with mild to moderate aortic stenosis and mild LVH. EEG negative for seizures, suggestive of encephalopathy.    Dr. Janett Medin felt that stroke was due to small vessel disease and to continue ASA/Brillinta X 30 days followed by ASA alone. Loop recorder interrogated and was negative for A fib. Right inattention is improving but he continued to have low grade fevers which has resolved.  Antibiotics dc 05/20 as work up negative.  He has had issues with confusion since yesterday and CXR negative and WBC stable. He continues to be limited by weakness and cognitive deficits with delay in processing, is impulsive and has poor safety awareness. Therapy has been working with patient who requires min A.   . Patient transferred to CIR on 12/22/2023 .    Patient currently requires min with mobility secondary to impaired timing and sequencing and decreased coordination, decreased initiation, decreased attention, decreased awareness, decreased safety awareness, and decreased memory, and decreased standing balance, decreased postural control, and decreased balance strategies.  Prior to hospitalization, patient was independent  with mobility and lived with Spouse in a House home.  Home access is  Level entry.  Patient will benefit from skilled PT intervention to maximize safe functional mobility, minimize fall risk, and decrease caregiver burden for planned discharge home with 24 hour supervision.  Anticipate patient will benefit from follow up OP at discharge.  PT - End of Session Activity Tolerance: Tolerates 30+ min activity without fatigue Endurance Deficit: No PT Assessment Rehab Potential (ACUTE/IP ONLY): Good PT Barriers to Discharge: Home environment access/layout;Decreased caregiver support;Behavior PT Patient demonstrates impairments in the following area(s): Balance;Behavior;Motor;Safety;Perception PT Transfers Functional Problem(s): Bed Mobility;Bed to Chair;Car PT  Locomotion Functional Problem(s): Ambulation;Stairs PT Plan PT Intensity: Minimum of 1-2 x/day ,45 to 90 minutes PT Frequency: 5 out of 7 days PT Duration Estimated Length of Stay: 7-10 days PT Treatment/Interventions: Ambulation/gait training;Community reintegration;DME/adaptive equipment instruction;Neuromuscular re-education;Psychosocial support;Wheelchair propulsion/positioning;UE/LE Strength taining/ROM;Stair training;UE/LE Coordination activities;Therapeutic Activities;Pain management;Skin care/wound management;Functional electrical stimulation;Discharge planning;Balance/vestibular training;Cognitive remediation/compensation;Disease management/prevention;Functional mobility training;Patient/family education;Splinting/orthotics;Therapeutic Exercise;Visual/perceptual  remediation/compensation PT Transfers Anticipated Outcome(s): supervision PT Locomotion Anticipated Outcome(s): supervision PT Recommendation Follow Up Recommendations: Outpatient PT Patient destination: Home Equipment Recommended: To be determined   PT Evaluation Precautions/Restrictions Precautions Precautions: Fall Recall of Precautions/Restrictions: Impaired Precaution/Restrictions Comments: pt is impulsive Restrictions Weight Bearing Restrictions Per Provider Order: No General   Vital SignsTherapy Vitals Temp: 98.1 F (36.7 C) Temp Source: Oral Pulse Rate: 74 Resp: 17 BP: (!) 146/99 Patient Position (if appropriate): Lying Oxygen Therapy SpO2: 98 % O2 Device: Room Air Pain   Pain Interference Pain Interference Pain Effect on Sleep: 2. Occasionally Pain Interference with Therapy Activities: 1. Rarely or not at all Pain Interference with Day-to-Day Activities: 1. Rarely or not at all Home Living/Prior Functioning Home Living Available Help at Discharge: Family Type of Home: House Home Access: Level entry Home Layout: Two level;Able to live on main level with bedroom/bathroom Alternate Level Stairs-Number of Steps: 1 flight Alternate Level Stairs-Rails: Right  Lives With: Spouse Prior Function Level of Independence: Independent with gait;Independent with transfers Driving: Yes (rarely since last stroke) Vocation: Retired Vision/Perception  Vision - History Ability to See in Adequate Light: 1 Impaired Vision - Assessment Eye Alignment: Within Functional Limits Ocular Range of Motion: Restricted on the left;Impaired-to be further tested in functional context Alignment/Gaze Preference: Gaze right Tracking/Visual Pursuits: Decreased smoothness of horizontal tracking;Requires cues, head turns, or add eye shifts to track;Impaired - to be further tested in functional context Saccades: Impaired - to be further tested in functional context Perception Perception:  Impaired Preception Impairment Details: Inattention/Neglect;Spatial orientation Praxis Praxis: Impaired Praxis Impairment Details: Initiation  Cognition Overall Cognitive Status: Impaired/Different from baseline Arousal/Alertness: Awake/alert Orientation Level: Oriented X4 Year: 2025 Month: May Day of Week: Correct Attention: Sustained Sustained Attention: Impaired Sustained Attention Impairment: Verbal basic;Functional basic Memory: Impaired Memory Impairment: Storage deficit;Retrieval deficit;Decreased short term memory Decreased Short Term Memory: Verbal basic;Functional basic Awareness: Impaired Awareness Impairment: Intellectual impairment Problem Solving: Impaired Problem Solving Impairment: Verbal basic;Functional basic Behaviors: Restless;Impulsive Safety/Judgment: Impaired Comments: impulsive Sensation Sensation Light Touch: Appears Intact Hot/Cold: Appears Intact Stereognosis: Not tested Coordination Gross Motor Movements are Fluid and Coordinated: No Motor  Motor Motor: Other (comment);Motor perseverations Motor - Skilled Clinical Observations: generalized weakness, some residual weakness in Lt hand from prior stroke   Trunk/Postural Assessment  Cervical Assessment Cervical Assessment: Within Functional Limits Thoracic Assessment Thoracic Assessment: Within Functional Limits Lumbar Assessment Lumbar Assessment: Within Functional Limits Postural Control Postural Control: Deficits on evaluation  Balance Balance Balance Assessed: Yes Static Sitting Balance Static Sitting - Balance Support: Feet supported;No upper extremity supported Static Sitting - Level of Assistance: 6: Modified independent (Device/Increase time) Dynamic Sitting Balance Dynamic Sitting - Balance Support: During functional activity Dynamic Sitting - Level of Assistance: 5: Stand by assistance Static Standing Balance Static Standing - Balance Support: During functional activity Static  Standing - Level of Assistance: 4: Min assist Dynamic Standing Balance Dynamic Standing - Balance Support: During functional activity Dynamic Standing - Level of Assistance: 4: Min assist Extremity Assessment      RLE Assessment RLE Assessment: Within Functional Limits LLE Assessment LLE Assessment: Within Functional Limits  Care Tool Care Tool  Bed Mobility Roll left and right activity   Roll left and right assist level: Supervision/Verbal cueing    Sit to lying activity   Sit to lying assist level: Supervision/Verbal cueing    Lying to sitting on side of bed activity   Lying to sitting on side of bed assist level: the ability to move from lying on the back to sitting on the side of the bed with no back support.: Supervision/Verbal cueing     Care Tool Transfers Sit to stand transfer   Sit to stand assist level: Contact Guard/Touching assist    Chair/bed transfer   Chair/bed transfer assist level: Contact Guard/Touching assist    Car transfer   Car transfer assist level: Contact Guard/Touching assist      Care Tool Locomotion Ambulation   Assist level: Minimal Assistance - Patient > 75% Assistive device: No Device Max distance: 200  Walk 10 feet activity   Assist level: Minimal Assistance - Patient > 75% Assistive device: No Device   Walk 50 feet with 2 turns activity   Assist level: Minimal Assistance - Patient > 75% Assistive device: No Device  Walk 150 feet activity   Assist level: Minimal Assistance - Patient > 75% Assistive device: No Device  Walk 10 feet on uneven surfaces activity   Assist level: Minimal Assistance - Patient > 75%    Stairs   Assist level: Contact Guard/Touching assist Stairs assistive device: 2 hand rails Max number of stairs: 12  Walk up/down 1 step activity   Walk up/down 1 step (curb) assist level: Contact Guard/Touching assist Walk up/down 1 step or curb assistive device: 2 hand rails  Walk up/down 4 steps activity   Walk  up/down 4 steps assist level: Contact Guard/Touching assist Walk up/down 4 steps assistive device: 2 hand rails  Walk up/down 12 steps activity   Walk up/down 12 steps assist level: Contact Guard/Touching assist Walk up/down 12 steps assistive device: 2 hand rails  Pick up small objects from floor Pick up small object from the floor (from standing position) activity did not occur: Safety/medical concerns      Wheelchair Is the patient using a wheelchair?: Yes Type of Wheelchair: Manual   Wheelchair assist level: Supervision/Verbal cueing Max wheelchair distance: 150  Wheel 50 feet with 2 turns activity   Assist Level: Supervision/Verbal cueing  Wheel 150 feet activity   Assist Level: Supervision/Verbal cueing    Refer to Care Plan for Long Term Goals  SHORT TERM GOAL WEEK 1 PT Short Term Goal 1 (Week 1): =LTGs d/t ELOS  Recommendations for other services: None   Skilled Therapeutic Intervention Evaluation completed (see details above) with patient education regarding purpose of PT evaluation, PT POC and goals, therapy schedule, weekly team meetings, and other CIR information including safety plan and fall risk safety. No complaint of pain.  Pt performed the below functional mobility tasks with the specified levels of skilled cuing and assistance.  Pt was most limited by poor initiation and impulsivity resulting in decreased safety awareness. Pt returned to room after session and was left with all needs in reach and alarm active.   Mobility Bed Mobility Bed Mobility: Supine to Sit Supine to Sit: Supervision/Verbal cueing Transfers Transfers: Sit to Stand;Stand Pivot Transfers;Stand to Sit Sit to Stand: Contact Guard/Touching assist Stand to Sit: Contact Guard/Touching assist Stand Pivot Transfers: Contact Guard/Touching assist Transfer (Assistive device): None Locomotion  Gait Ambulation: Yes Gait Distance (Feet): 200 Feet Assistive device: None Gait Gait: Yes Gait  Pattern: Impaired Gait Pattern: Decreased stride length Stairs / Additional Locomotion Stairs: Yes Stairs Assistance: Contact Guard/Touching assist Stair Management Technique: Two rails Number of Stairs: 12 Height of Stairs: 6 Wheelchair Mobility Wheelchair Mobility: Yes Wheelchair Assistance: Doctor, general practice: Both upper extremities Wheelchair Parts Management: Needs assistance Distance: 150   Discharge Criteria: Patient will be discharged from PT if patient refuses treatment 3 consecutive times without medical reason, if treatment goals not met, if there is a change in medical status, if patient makes no progress towards goals or if patient is discharged from hospital.  The above assessment, treatment plan, treatment alternatives and goals were discussed and mutually agreed upon: by patient  Tex Filbert 12/23/2023, 4:33 PM

## 2023-12-23 NOTE — Plan of Care (Signed)
  Problem: RH Eating Goal: LTG Patient will perform eating w/assist, cues/equip (OT) Description: LTG: Patient will perform eating with assist, with/without cues using equipment (OT) Flowsheets (Taken 12/23/2023 1311) LTG: Pt will perform eating with assistance level of: Set up assist    Problem: RH Grooming Goal: LTG Patient will perform grooming w/assist,cues/equip (OT) Description: LTG: Patient will perform grooming with assist, with/without cues using equipment (OT) Flowsheets (Taken 12/23/2023 1311) LTG: Pt will perform grooming with assistance level of: Set up assist    Problem: RH Bathing Goal: LTG Patient will bathe all body parts with assist levels (OT) Description: LTG: Patient will bathe all body parts with assist levels (OT) Flowsheets (Taken 12/23/2023 1311) LTG: Pt will perform bathing with assistance level/cueing: Supervision/Verbal cueing   Problem: RH Dressing Goal: LTG Patient will perform upper body dressing (OT) Description: LTG Patient will perform upper body dressing with assist, with/without cues (OT). Flowsheets (Taken 12/23/2023 1311) LTG: Pt will perform upper body dressing with assistance level of: Supervision/Verbal cueing Goal: LTG Patient will perform lower body dressing w/assist (OT) Description: LTG: Patient will perform lower body dressing with assist, with/without cues in positioning using equipment (OT) Flowsheets (Taken 12/23/2023 1311) LTG: Pt will perform lower body dressing with assistance level of: Supervision/Verbal cueing   Problem: RH Toileting Goal: LTG Patient will perform toileting task (3/3 steps) with assistance level (OT) Description: LTG: Patient will perform toileting task (3/3 steps) with assistance level (OT)  Flowsheets (Taken 12/23/2023 1311) LTG: Pt will perform toileting task (3/3 steps) with assistance level: Supervision/Verbal cueing   Problem: RH Toilet Transfers Goal: LTG Patient will perform toilet transfers w/assist  (OT) Description: LTG: Patient will perform toilet transfers with assist, with/without cues using equipment (OT) Flowsheets (Taken 12/23/2023 1311) LTG: Pt will perform toilet transfers with assistance level of: Supervision/Verbal cueing   Problem: RH Tub/Shower Transfers Goal: LTG Patient will perform tub/shower transfers w/assist (OT) Description: LTG: Patient will perform tub/shower transfers with assist, with/without cues using equipment (OT) Flowsheets (Taken 12/23/2023 1311) LTG: Pt will perform tub/shower stall transfers with assistance level of: Supervision/Verbal cueing

## 2023-12-23 NOTE — Progress Notes (Signed)
 Inpatient Rehabilitation  Patient information reviewed and entered into eRehab system by [facility]. Karen Kays., CCC/SLP, PPS Coordinator.  Information including medical coding, functional ability and quality indicators will be reviewed and updated through discharge.

## 2023-12-23 NOTE — Progress Notes (Signed)
 Inpatient Rehabilitation Admission Medication Review by a Pharmacist  A complete drug regimen review was completed for this patient to identify any potential clinically significant medication issues.  High Risk Drug Classes Is patient taking? Indication by Medication  Antipsychotic Yes, as an intravenous medication Seroquel- mood/agitation/insomnia Compazine - n/v   Anticoagulant Yes Lovenox - DVT ppx   Antibiotic No   Opioid No   Antiplatelet Yes bASA, brillinta- planning dapt x 4 weeks, followed by bASA alone  (started 5/21- stop entered for 6/17)   Hypoglycemics/insulin  Yes Insulin - DM   Vasoactive Medication Yes Tamsulosin - BPH, losartan -HTN  NTG- CAD   Chemotherapy No   Other Yes fleet enema , PEG , bisacodyl , and - constipation Maalox- indigestion Pantoprazole - reflux  Diphenhydramine- itching  Acetaminophen - pain  Robitussin- cough   melatonin and -insomnia Allopurinol - gout  Lexapro - mood  Lipitor - HLD  VitD- supplement      Type of Medication Issue Identified Description of Issue Recommendation(s)  Drug Interaction(s) (clinically significant)     Duplicate Therapy     Allergy     No Medication Administration End Date     Incorrect Dose     Additional Drug Therapy Needed     Significant med changes from prior encounter (inform family/care partners about these prior to discharge). STOP plavix   Communicate relevant medication changes to patient/family members at discharge from CIR.   Restart or discontinue PTA meds not resumed in CIR at discharge if clinically indicated.   Other       Clinically significant medication issues were identified that warrant physician communication and completion of prescribed/recommended actions by midnight of the next day:  No  Name of provider notified for urgent issues identified:   Provider Method of Notification:     Pharmacist comments:   Time spent performing this drug regimen review (minutes):  30  Chrystie Crass,  PharmD Clinical Pharmacist  12/23/2023 9:35 AM

## 2023-12-23 NOTE — Evaluation (Signed)
 Speech Language Pathology Assessment and Plan  Patient Details  Name: Tyler Guerra. MRN: [id number] Date of Birth: 11-Jul-1951  SLP Diagnosis: Cognitive Impairments  Rehab Potential: Good ELOS: 7-10 days    Today's Date: 12/23/2023 SLP Individual Time: 4098-1191 SLP Individual Time Calculation (min): 58 min   Hospital Problem: Principal Problem:   Stroke, small vessel (HCC)  Past Medical History:  Past Medical History:  Diagnosis Date   Anxiety state 03/16/2016   Aortic stenosis    Mild to moderate by echo 12/2023 with mean aortic valve gradient 12 mmHg   Aphasia 01/26/2017   Benign localized prostatic hyperplasia with lower urinary tract symptoms (LUTS)    BPV (benign positional vertigo) 11/02/2014   Carotid artery stenosis    PER DUPLEX 10-22-2015  BILATERAL ICA 1-39%   Cataract    Coronary artery disease    CABG 2000 with LIMA> LAD, SVG > RCA and free radial > OM. 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   Gout    Gout 07/18/2010   Qualifier: Diagnosis of  By: Vallarie Gauze MD, Tyrone Gallop     H/O eye injury    chronic changes to left eye after injury   Hand weakness 03/02/2018   Hearing loss 04/21/2013   History of TIA (transient ischemic attack)    03/ 2017  no residual after brief episode loss peripheral vision   HOH (hard of hearing)    Hyperglycemia 05/17/2016   Hyperlipemia    Hyperlipidemia 07/18/2010   Qualifier: Diagnosis of  By: Vallarie Gauze MD, Graham     Hypertension    HYPERTENSION, BENIGN ESSENTIAL 07/18/2010   Qualifier: Diagnosis of  By: Vallarie Gauze MD, Graham     Hypokalemia 01/26/2017   Knee pain 03/13/2019   Left shoulder pain 09/04/2018   Pneumothorax 10/11/2019    RIGHT    Prostate cancer (HCC) UROLOGIST-  DR GRAPEY/  ONCOLOGIST-  DR MANNING   dx 2015--- Stage T1c, Gleason 3+4, PSA 4.03, vol 44cc   PVC's (premature ventricular contractions)    Rash and nonspecific skin eruption 10/14/2015   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   S/P CABG x 05 Dec 1998   Skin lesion 10/14/2015   Stroke [facility])    TIA (transient ischemic attack) 10/14/2015   Ventral hernia 10/26/2011   Wears glasses    Past Surgical History:  Past Surgical History:  Procedure Laterality Date   APPENDECTOMY  1975   CARDIAC CATHETERIZATION  12-22-2001   dr Kay Parson   severe native vessel disease mLAD 60-70%,  total occulsion pCFX  and pRCA/  widely patent saphenous vein , free radial , and LIMA grafts/  minminal lv dysfunction, ef 60%   CHEST TUBE INSERTION Right 10/11/2019   CORONARY ARTERY BYPASS GRAFT  12/1998   LIMA to LAD,  SVG to PDA and Diagonal, Free radial graft to OM   CORONARY STENT INTERVENTION N/A 05/26/2021   Procedure: CORONARY STENT INTERVENTION;  Surgeon: Kyra Phy, MD;  Location: [facility] IN[facility]IVE [facility];  Service: Cardiovascular;  Laterality: N/A;   Exericse treadmill test  last one 01-03-2014  dr Kay Parson   normal exercise tolerance w/ hypertensive repsonse,  no ischemic  EKG changes, appropriate HR response & recovery ([facility] TM score 9;  Low Risk , PVC's w/ exertion)   EYE SURGERY     FRACTURE SURGERY     LEFT HEART CATH AND CORS/GRAFTS ANGIOGRAPHY N/A 05/26/2021   Procedure: LEFT HEART CATH AND CORS/GRAFTS ANGIOGRAPHY;  Surgeon: Kyra Phy, MD;  Location: [facility] CV [facility];  Service: Cardiovascular;  Laterality: N/A;   LOOP RECORDER INSERTION N/A 12/22/2022   Procedure: LOOP RECORDER INSERTION;  Surgeon: Tammie Fall, MD;  Location: [facility];  Service: Cardiovascular;  Laterality: N/A;   PROSTATE BIOPSY     RADIOACTIVE SEED IMPLANT N/A 01/13/2016   Procedure: RADIOACTIVE SEED IMPLANT/BRACHYTHERAPY IMPLANT;  Surgeon:  Ann Barnacle, MD;  Location: [facility];  Service: Urology;  Laterality: N/A;    Assessment / Plan / Recommendation Clinical Impression HPI: Pt is a 73 y/o male with PMH of HTN, prostate cancer, right ACA stroke in 12/2022, gout, CAD, anxiety, BPPV, CABG, and HOH who presented to [facility] on [facility]23 with c/o left sided hemiparesis and R gaze preference with associated HA. In ED NIHSS 11, CT head negative, CTA with focal occlusion vs severe near occlusion of right ACA A3 segment. Febrile to 100.8 but no leukocytosis. MRI showed small stroke in the right parietal white matter. Neurology recommendations for aspirin  and plavix  and loop recorder interrogation. EEG showed moderate diffuse encephalopathy but no seizures, loop showed no afib. Fever workup negative so broad spectrum abx stopped.   Clinical Impression:  Bedside Swallow Evaluation: A bedside swallow evaluation was completed to assess for s/sx of oropharyngeal dysphagia. Oral mechanism exam WFL. POs administered included thin liquids and solids. Patient with timely mastication and complete oral clearance. No s/sx of aspiration present. Recommend regular/thin diet with use of standardized precautions including sitting upright during PO and taking small bites/sips at a slow rate. No further ST needs regarding dysphagia. Cognitive-Linguistic: Patient was evaluated via the Cognistat to assess cognitive-linguistic functioning. Patient with severe deficits in the realms of sustained attention, short term memory, and basic problem solving. Patient with seemingly intact expressive and receptive language as well as long term memory. Patient very fidgety throughout the evaluation with poor awareness of performance. Patient was demonstrating improvements in L attention compared to acute documentation, however recommend continuing to target in tx.  Dysarthria: Patient is 100% intelligible at the conversational level.  Pt would benefit from  skilled ST services to maximize cognition in order to maximize functional independence at d/c. Anticipate patient will require 24 hour supervision at d/c and f/u SLP services.    Skilled Therapeutic Interventions          Patient evaluated using a standardized cognitive linguistic assessment and bedside swallow evaluation to assess current cognitive, communicative and swallowing function. See above for details.    SLP Assessment  Patient will need skilled Speech Lanaguage Pathology Services during CIR admission    Recommendations  SLP Diet Recommendations: Age appropriate regular solids;Thin Liquid Administration via: Cup;Straw Medication Administration: Whole meds with liquid Supervision: Patient able to self feed Compensations: Slow rate;Small sips/bites Oral Care Recommendations: Oral care BID Patient destination: Home Follow up Recommendations: Outpatient SLP;24 hour supervision/assistance Equipment Recommended: None recommended by SLP    SLP Frequency 3 to 5 out of 7 days   SLP Duration  SLP Intensity  SLP Treatment/Interventions 7-10 days  Minumum of 1-2 x/day, 30 to 90 minutes  Cognitive remediation/compensation;Internal/external aids;Cueing hierarchy;Therapeutic Activities;Functional tasks;Patient/family education    Pain None reported  SLP Evaluation Cognition Overall Cognitive Status: Impaired/Different from baseline Arousal/Alertness: Awake/alert Orientation Level: Oriented X4 Year: 2025 Month: May Day of Week: Correct Attention: Sustained Sustained Attention: Impaired Sustained Attention Impairment: Verbal basic;Functional basic Memory: Impaired Memory Impairment: Storage deficit;Retrieval deficit;Decreased short term memory Decreased Short Term Memory: Verbal basic;Functional basic Awareness: Impaired Awareness Impairment: Intellectual impairment Problem Solving: Impaired Problem Solving Impairment: Verbal basic;Functional basic Behaviors:  Restless;Impulsive Safety/Judgment: Impaired Comments: impulsive  Comprehension Auditory Comprehension Overall Auditory Comprehension: Impaired Yes/No Questions: Within Functional Limits Commands: Impaired Multistep Basic Commands: 75-100% accurate Interfering Components: Attention EffectiveTechniques: Repetition;Extra processing time Expression Expression Primary Mode of Expression: Verbal Verbal Expression Overall Verbal Expression: Appears within functional limits for tasks assessed Initiation: No impairment Repetition: No impairment Naming: No impairment Oral Motor Oral Motor/Sensory Function Overall Oral Motor/Sensory Function: Within functional limits Motor Speech Overall Motor Speech: Appears within functional limits for tasks assessed  Care Tool Care Tool Cognition Ability to hear (with hearing aid or hearing appliances if normally used Ability to hear (with hearing aid or hearing appliances if normally used): 1. Minimal difficulty - difficulty in some environments (e.g. when person speaks softly or setting is noisy)   Expression of Ideas and Wants Expression of Ideas and Wants: 3. Some difficulty - exhibits some difficulty with expressing needs and ideas (e.g, some words or finishing thoughts) or speech is not clear   Understanding Verbal and Non-Verbal Content Understanding Verbal and Non-Verbal Content: 3. Usually understands - understands most conversations, but misses some part/intent of message. Requires cues at times to understand  Memory/Recall Ability Memory/Recall Ability : Current season;That he or she is in a hospital/hospital unit;Staff names and faces    Bedside Swallowing Assessment General Diet Prior to this Study: Regular;Thin liquids (Level 0) Respiratory Status: Room air Behavior/Cognition: Alert;Cooperative Oral Cavity - Dentition: Missing dentition Self-Feeding Abilities: Able to feed self Vision: Functional for self-feeding Patient Positioning:  Upright in bed Baseline Vocal Quality: Normal Volitional Cough: Strong Volitional Swallow: Able to elicit  Ice Chips Ice chips: Not tested Thin Liquid Thin Liquid: Within functional limits Presentation: Self Fed;Straw Nectar Thick Nectar Thick Liquid: Not tested Honey Thick Honey Thick Liquid: Not tested Puree Puree: Not tested Solid Solid: Within functional limits Presentation: Self Fed BSE Assessment Risk for Aspiration Impact on safety and function: No limitations Other Related Risk Factors: Previous CVA  Short Term Goals: Week 1: SLP Short Term Goal 1 (Week 1): STG = LTG due to ELOS  Refer to Care Plan for Long Term Goals  Recommendations for other services: None   Discharge Criteria: Patient will be discharged from SLP if patient refuses treatment 3 consecutive times without medical reason, if treatment goals not met, if there is a change in medical status, if patient makes no progress towards goals or if patient is discharged from hospital.  The above assessment, treatment plan, treatment alternatives and goals were discussed and mutually agreed upon: by patient  Zaylie Gisler M.A., CCC-SLP 12/23/2023, 3:12 PM

## 2023-12-23 NOTE — Progress Notes (Addendum)
 PROGRESS NOTE   Subjective/Complaints:  Pt reports he's confused "time to time" but doesn't think it's at all times.   Staff feels he's confused with poor safety awareness at all times-  He feels he's staying in bed because eof the "alarms"- not because he needs to stay in bed -reports alarms kept him up last night. LBM yesterday- but had been having constipation prior to that- wasn't clear if that was prior to hospital or in hospital.   Denies dysuria, but has urgency- and has had accidents since "staff not here fast enough".   Doesn't feel weak.  But admits cannot stand for any amount of time.   Spoke to nursing, pt has been somewhat confused since got here yesterday  ROS:  Pt denies SOB, abd pain, CP, N/V/C/D, and vision changes Negative except for HPI  Objective:   No results found. Recent Labs    12/21/23 1103 12/23/23 0636  WBC 7.1 6.4  HGB 12.5* 13.2  HCT 36.0* 38.4*  PLT 150 174   Recent Labs    12/21/23 1103 12/23/23 0636  NA 138 141  K 3.7 3.3*  CL 107 109  CO2 22 24  GLUCOSE 104* 113*  BUN 19 15  CREATININE 1.03 1.18  CALCIUM  9.0 9.4    Intake/Output Summary (Last 24 hours) at 12/23/2023 0957 Last data filed at 12/23/2023 0847 Gross per 24 hour  Intake 726 ml  Output --  Net 726 ml        Physical Exam: Vital Signs Blood pressure (!) 170/99, pulse 72, temperature 99.3 F (37.4 C), temperature source Oral, resp. rate 17, height 5\' 10"  (1.778 m), weight 92.4 kg, SpO2 96%.    General: awake, alert, appropriate, but keeps moving around in bed- not clear why- got to EOB, set off alarm, then back into bed- NAD HENT: conjugate gaze; oropharynx moist CV: regular rate and rhythm; no JVD Pulmonary: CTA B/L; no W/R/R- good air movement GI: soft, NT, ND, (+)BS-normoactive Psychiatric: very vague- flat Neurological: decreased memory and tangential- word substitutions noted, some stuttering;  mild LUE weakness- 4+/5 but LLE 5/5- some apraxia noted  Mental Status: AAO to May 2025, note date, not city, Hospital, knows its Wednesday, decreased memory, Able to name president, able to provide his birthday. Delayed responses, Poor safety awareness. Distractible and poor attention. Confused.  R gaze preference  Speech/Languate: Naming and repetition intact, fluent, follows simple commands but has more difficulty with multi step or complex commands CRANIAL NERVES: II: PERRL. Visual fields full III, IV, VI: EOM overall intact, requires multiple attempts to complete V: normal sensation bilaterally VII: Mild left facial weakness  VIII: Hard of hearing IX, X: normal palatal elevation XI: 5/5 head turn and 5/5 shoulder shrug bilaterally XII: Tongue midline     MOTOR: RUE: 5/5 Deltoid, 5/5 Biceps, 5/5 Triceps,5/5 Grip LUE: 5/5 Deltoid, 4+/5 Biceps, 4+/5 Triceps, 4+/5 Grip RLE: HF 5/5, KE 5/5, ADF 5/5, APF 5/5 LLE: HF 5/5, KE 5/5, ADF 5/5, APF 5/5   Possible left LE apraxia    REFLEXES: No ankle clonus   SENSORY: Normal to touch all 4 extremities   No hypertonia noted   Assessment/Plan: 1.  Functional deficits which require 3+ hours per day of interdisciplinary therapy in a comprehensive inpatient rehab setting. Physiatrist is providing close team supervision and 24 hour management of active medical problems listed below. Physiatrist and rehab team continue to assess barriers to discharge/monitor patient progress toward functional and medical goals  Care Tool:  Bathing              Bathing assist       Upper Body Dressing/Undressing Upper body dressing        Upper body assist      Lower Body Dressing/Undressing Lower body dressing            Lower body assist       Toileting Toileting    Toileting assist       Transfers Chair/bed transfer  Transfers assist           Locomotion Ambulation   Ambulation assist              Walk 10  feet activity   Assist           Walk 50 feet activity   Assist           Walk 150 feet activity   Assist           Walk 10 feet on uneven surface  activity   Assist           Wheelchair     Assist               Wheelchair 50 feet with 2 turns activity    Assist            Wheelchair 150 feet activity     Assist          Blood pressure (!) 170/99, pulse 72, temperature 99.3 F (37.4 C), temperature source Oral, resp. rate 17, height 5\' 10"  (1.778 m), weight 92.4 kg, SpO2 96%.  Medical Problem List and Plan: 1. Functional deficits secondary to acute CVA R parietal periventricular white matter              -patient may shower             -ELOS/Goals: 10-14 days, Sup to min A OT, Sup PT, Sup SLP             -Admit to CIR  First day- evaluations- Con't CIR PT, OT and SLP 2.  Antithrombotics: -DVT/anticoagulation:  Pharmaceutical: Lovenox - -antiplatelet therapy:  ASA and Ticagrelor  X 4 weeks followed by ASA alone 3. Pain Management: Tylenol  prn.   5/22- denies pain 4. Mood/Behavior/Sleep: LCSW to follow for evaluation and support.              -antipsychotic agents: N/A             -Continue lexapro   5. Neuropsych/cognition: This patient may be intermittently capable of making decisions on his own behalf. 6. Skin/Wound Care: Routine pressure relief measures.  7. Fluids/Electrolytes/Nutrition: Monitor I/O. Check CMET in am 8. Delirium: Per family has had issues with confusion since yesterday. Low grade fevers are resolving             --Work up 05/20-->CXR- neg. Procal <0.10  WBC 7.1             --Monitor sleep wake disruption (has not slept for more than 24 hours per wife). Tele sitter ordered for fall prevention. Seroquel added to help with sleep.   5/22- T 99.3 this AM, but otherwise no  temps in 24 hours- WBC is stable- 6.4- rechecking U/A and Cx- could be due to stroke, however wife per chart, felt it came AFTER stroke- but  admitted was having issues at home prior- also per chart. Will get in SLP to tease out what's going on.  9. HTN: Monitor BP TID--remains labile. Monitor with activity.  --Continue Cozaar  and avoid hypotension due to intracranial atherosclerosis.  5/22-out of permissive HTN timing-  BP running up to 170/99- this AM- losartan  was restarted 25 mg daily- but metoprolol  was not- I don't think Metoprolol  appropriate right now- HR is in 70's off metoprolol - so I think will start Norvasc 2.5 mg daily to help mildly as well- to AVOID HYPOTENSION 10. T2DM: Hgb A1C- 6.7. Monitor BS ac/hs and use SSI for elevated BS             --CM restrictions added to diet.   5/22- CBGs running 85-125- will con't to monitor 11. Hyperlipidemia: LDL @ goal. Continue Lipitor  80 mg 12. Prostate CA: Was on Flomax  0.8 mg till a few months ago.  --Has had hesitancy recently. Hx of retention in the past due to UTI.              --will monitor PVR w/bladder scan. Check UA             -Continue flomax , currently 0.4mg  daily 13. Class 1 Obesity: Educated on importance of activity and weight loss Body mass index is 29.23 kg/m. 14. H/o gout: STable on allopurinol . 15. Mood disorder: On Lexapro .   16. Hypokalemia  5/22- will replete K+ for 3.3 K+ level- 40 mEq x1. Will recheck Monday    I spent a total of 43   minutes on total care today- >50% coordination of care- due to  D/w nursing-about confusion and delirium as well as U/A- review of chart, as well as labs, vitals and all notes   LOS: 1 days A FACE TO FACE EVALUATION WAS PERFORMED  Tyler Guerra 12/23/2023, 9:57 AM

## 2023-12-23 NOTE — Progress Notes (Signed)
 Patient alert and oriented at beginning of conversation. Able to verbalize S/S of stroke and described prior strokes. Verbalized understanding of need to seek medical care immediately upon stroke or heart attack symptoms. Discussed plan of care, including bladder checks to identify any retention. Introduced patient to care coordinator team and conference schedule, showed book. Patient became confused when interrupted by meal ambassador and conversation ended. Spouse arrived and confirmed intermittent confusion has been part of this admission.

## 2023-12-23 NOTE — Plan of Care (Signed)
  Problem: RH BOWEL ELIMINATION Goal: RH STG MANAGE BOWEL WITH ASSISTANCE Description: STG Manage Bowel with Assistance. Outcome: Progressing Goal: RH STG MANAGE BOWEL W/MEDICATION W/ASSISTANCE Description: STG Manage Bowel with Medication with Assistance. Outcome: Progressing   Problem: RH BLADDER ELIMINATION Goal: RH STG MANAGE BLADDER WITH ASSISTANCE Description: STG Manage Bladder With Assistance Outcome: Progressing Goal: RH STG MANAGE BLADDER WITH EQUIPMENT WITH ASSISTANCE Description: STG Manage Bladder With Equipment With Assistance Outcome: Progressing   Problem: RH SKIN INTEGRITY Goal: RH STG SKIN FREE OF INFECTION/BREAKDOWN Outcome: Progressing Goal: RH STG MAINTAIN SKIN INTEGRITY WITH ASSISTANCE Description: STG Maintain Skin Integrity With Assistance. Outcome: Progressing   Problem: RH SAFETY Goal: RH STG ADHERE TO SAFETY PRECAUTIONS W/ASSISTANCE/DEVICE Description: STG Adhere to Safety Precautions With Assistance/Device. Outcome: Progressing Goal: RH STG DECREASED RISK OF FALL WITH ASSISTANCE Description: STG Decreased Risk of Fall With Assistance. Outcome: Progressing   Problem: RH PAIN MANAGEMENT Goal: RH STG PAIN MANAGED AT OR BELOW PT'S PAIN GOAL Outcome: Progressing   Problem: RH KNOWLEDGE DEFICIT GENERAL Goal: RH STG INCREASE KNOWLEDGE OF SELF CARE AFTER HOSPITALIZATION Outcome: Progressing   Problem: Education: Goal: Ability to describe self-care measures that may prevent or decrease complications (Diabetes Survival Skills Education) will improve Outcome: Progressing Goal: Individualized Educational Video(s) Outcome: Progressing   Problem: Coping: Goal: Ability to adjust to condition or change in health will improve Outcome: Progressing   Problem: Fluid Volume: Goal: Ability to maintain a balanced intake and output will improve Outcome: Progressing   Problem: Health Behavior/Discharge Planning: Goal: Ability to identify and utilize available  resources and services will improve Outcome: Progressing Goal: Ability to manage health-related needs will improve Outcome: Progressing   Problem: Metabolic: Goal: Ability to maintain appropriate glucose levels will improve Outcome: Progressing   Problem: Nutritional: Goal: Maintenance of adequate nutrition will improve Outcome: Progressing Goal: Progress toward achieving an optimal weight will improve Outcome: Progressing   Problem: Skin Integrity: Goal: Risk for impaired skin integrity will decrease Outcome: Progressing   Problem: Tissue Perfusion: Goal: Adequacy of tissue perfusion will improve Outcome: Progressing

## 2023-12-23 NOTE — Plan of Care (Signed)
  Problem: RH Problem Solving Goal: LTG Patient will demonstrate problem solving for (SLP) Description: LTG:  Patient will demonstrate problem solving for basic/complex daily situations with cues  (SLP) Flowsheets (Taken 12/23/2023 1515) LTG: Patient will demonstrate problem solving for (SLP): Basic daily situations LTG Patient will demonstrate problem solving for: Moderate Assistance - Patient 50 - 74%   Problem: RH Memory Goal: LTG Patient will demonstrate ability for day to day (SLP) Description: LTG:   Patient will demonstrate ability for day to day recall/carryover during cognitive/linguistic activities with assist  (SLP) Flowsheets (Taken 12/23/2023 1515) LTG: Patient will demonstrate ability for day to day recall: New information LTG: Patient will demonstrate ability for day to day recall/carryover during cognitive/linguistic activities with assist (SLP): Moderate Assistance - Patient 50 - 74%   Problem: RH Attention Goal: LTG Patient will demonstrate this level of attention during functional activites (SLP) Description: LTG:  Patient will will demonstrate this level of attention during functional activites (SLP) Flowsheets (Taken 12/23/2023 1515) Patient will demonstrate during cognitive/linguistic activities the attention type of: Sustained LTG: Patient will demonstrate this level of attention during cognitive/linguistic activities with assistance of (SLP): Moderate Assistance - Patient 50 - 74% Number of minutes patient will demonstrate attention during cognitive/linguistic activities: 15   Problem: RH Awareness Goal: LTG: Patient will demonstrate awareness during functional activites type of (SLP) Description: LTG: Patient will demonstrate awareness during functional activites type of (SLP) Flowsheets (Taken 12/23/2023 1515) Patient will demonstrate during cognitive/linguistic activities awareness type of: Intellectual LTG: Patient will demonstrate awareness during  cognitive/linguistic activities with assistance of (SLP): Moderate Assistance - Patient 50 - 74%

## 2023-12-24 DIAGNOSIS — I639 Cerebral infarction, unspecified: Secondary | ICD-10-CM | POA: Diagnosis not present

## 2023-12-24 LAB — GLUCOSE, CAPILLARY
Glucose-Capillary: 110 mg/dL — ABNORMAL HIGH (ref 70–99)
Glucose-Capillary: 152 mg/dL — ABNORMAL HIGH (ref 70–99)
Glucose-Capillary: 132 mg/dL — ABNORMAL HIGH (ref 70–99)
Glucose-Capillary: 129 mg/dL — ABNORMAL HIGH (ref 70–99)

## 2023-12-24 NOTE — Plan of Care (Signed)
  Problem: Consults Goal: RH STROKE PATIENT EDUCATION Description: See Patient Education module for education specifics  Outcome: Progressing Goal: Nutrition Consult-if indicated Outcome: Progressing   Problem: RH BOWEL ELIMINATION Goal: RH STG MANAGE BOWEL WITH ASSISTANCE Description: STG Manage Bowel with Assistance. Outcome: Progressing Goal: RH STG MANAGE BOWEL W/MEDICATION W/ASSISTANCE Description: STG Manage Bowel with Medication with Assistance. Outcome: Progressing   Problem: RH BLADDER ELIMINATION Goal: RH STG MANAGE BLADDER WITH ASSISTANCE Description: STG Manage Bladder With Assistance Outcome: Progressing   Problem: RH SKIN INTEGRITY Goal: RH STG SKIN FREE OF INFECTION/BREAKDOWN Outcome: Progressing

## 2023-12-24 NOTE — Progress Notes (Signed)
 PROGRESS NOTE   Subjective/Complaints:  Pt  slept much better-  food at bedside not touched Per staff, he slept all night, and sleepy this AM Woke to talk briefly then wanted to go back to sleep  ROS:  Limited by sedation Negative except for HPI  Objective:   No results found. Recent Labs    12/21/23 1103 12/23/23 0636  WBC 7.1 6.4  HGB 12.5* 13.2  HCT 36.0* 38.4*  PLT 150 174   Recent Labs    12/21/23 1103 12/23/23 0636  NA 138 141  K 3.7 3.3*  CL 107 109  CO2 22 24  GLUCOSE 104* 113*  BUN 19 15  CREATININE 1.03 1.18  CALCIUM  9.0 9.4    Intake/Output Summary (Last 24 hours) at 12/24/2023 1610 Last data filed at 12/24/2023 0700 Gross per 24 hour  Intake 600 ml  Output 200 ml  Net 400 ml        Physical Exam: Vital Signs Blood pressure (!) 157/79, pulse 76, temperature 98.3 F (36.8 C), resp. rate 18, height 5\' 10"  (1.778 m), weight 92.4 kg, SpO2 98%.     General: sleepy- hard ot wake u, but did briefly- supine in bed; NAD HENT: conjugate gaze; oropharynx moist CV: regular rate and rhythm; no JVD Pulmonary: CTA B/L; no W/R/R- good air movement GI: soft, NT, ND, (+)BS Psychiatric: appropriate Neurological: keeps falling asleep decreased memory and tangential- word substitutions noted, some stuttering; mild LUE weakness- 4+/5 but LLE 5/5- some apraxia noted  Mental Status: AAO to May 2025, note date, not city, Hospital, knows its Wednesday, decreased memory, Able to name president, able to provide his birthday. Delayed responses, Poor safety awareness. Distractible and poor attention. Confused.  R gaze preference  Speech/Languate: Naming and repetition intact, fluent, follows simple commands but has more difficulty with multi step or complex commands CRANIAL NERVES: II: PERRL. Visual fields full III, IV, VI: EOM overall intact, requires multiple attempts to complete V: normal sensation  bilaterally VII: Mild left facial weakness  VIII: Hard of hearing IX, X: normal palatal elevation XI: 5/5 head turn and 5/5 shoulder shrug bilaterally XII: Tongue midline     MOTOR: RUE: 5/5 Deltoid, 5/5 Biceps, 5/5 Triceps,5/5 Grip LUE: 5/5 Deltoid, 4+/5 Biceps, 4+/5 Triceps, 4+/5 Grip RLE: HF 5/5, KE 5/5, ADF 5/5, APF 5/5 LLE: HF 5/5, KE 5/5, ADF 5/5, APF 5/5   Possible left LE apraxia    REFLEXES: No ankle clonus   SENSORY: Normal to touch all 4 extremities   No hypertonia noted   Assessment/Plan: 1. Functional deficits which require 3+ hours per day of interdisciplinary therapy in a comprehensive inpatient rehab setting. Physiatrist is providing close team supervision and 24 hour management of active medical problems listed below. Physiatrist and rehab team continue to assess barriers to discharge/monitor patient progress toward functional and medical goals  Care Tool:  Bathing              Bathing assist Assist Level: Contact Guard/Touching assist     Upper Body Dressing/Undressing Upper body dressing        Upper body assist Assist Level: Supervision/Verbal cueing    Lower Body Dressing/Undressing Lower  body dressing            Lower body assist Assist for lower body dressing: Contact Guard/Touching assist     Toileting Toileting    Toileting assist Assist for toileting: Contact Guard/Touching assist     Transfers Chair/bed transfer  Transfers assist     Chair/bed transfer assist level: Contact Guard/Touching assist     Locomotion Ambulation   Ambulation assist      Assist level: Minimal Assistance - Patient > 75% Assistive device: No Device Max distance: 200   Walk 10 feet activity   Assist     Assist level: Minimal Assistance - Patient > 75% Assistive device: No Device   Walk 50 feet activity   Assist    Assist level: Minimal Assistance - Patient > 75% Assistive device: No Device    Walk 150 feet  activity   Assist    Assist level: Minimal Assistance - Patient > 75% Assistive device: No Device    Walk 10 feet on uneven surface  activity   Assist     Assist level: Minimal Assistance - Patient > 75%     Wheelchair     Assist Is the patient using a wheelchair?: Yes Type of Wheelchair: Manual    Wheelchair assist level: Supervision/Verbal cueing Max wheelchair distance: 150    Wheelchair 50 feet with 2 turns activity    Assist        Assist Level: Supervision/Verbal cueing   Wheelchair 150 feet activity     Assist      Assist Level: Supervision/Verbal cueing   Blood pressure (!) 157/79, pulse 76, temperature 98.3 F (36.8 C), resp. rate 18, height 5\' 10"  (1.778 m), weight 92.4 kg, SpO2 98%.  Medical Problem List and Plan: 1. 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  Antithrombotics: -DVT/anticoagulation:  Pharmaceutical: Lovenox - -antiplatelet therapy:  ASA and Ticagrelor  X 4 weeks followed by ASA alone 3. Pain Management: Tylenol  prn.   5/22- denies pain 4. Mood/Behavior/Sleep: LCSW to follow for evaluation and support.              -antipsychotic agents: N/A             -Continue lexapro   5. Neuropsych/cognition: This patient may be intermittently capable of making decisions on his own behalf. 6. Skin/Wound Care: Routine pressure relief measures.  7. Fluids/Electrolytes/Nutrition: Monitor I/O. Check CMET in am 8. Delirium: Per family has had issues with confusion since yesterday. Low grade fevers are resolving             --Work up 05/20-->CXR- neg. Procal <0.10  WBC 7.1             --Monitor sleep wake disruption (has not slept for more than 24 hours per wife). Tele sitter ordered for fall prevention. Seroquel added  to help with sleep.   5/22- T 99.3 this AM, but otherwise no temps in 24 hours- WBC is stable- 6.4- rechecking U/A and Cx- could be due to stroke, however wife per chart, felt it came AFTER stroke- but admitted was having  issues at home prior- also per chart. Will get in SLP to tease out what's going on.   5/23- last CXR from 5/20 negative and U/A 5/22 (-) for UTI 9. HTN: Monitor BP TID--remains labile. Monitor with activity.  --Continue Cozaar  and avoid hypotension due to intracranial atherosclerosis.  5/22-out of permissive HTN timing-  BP running up to 170/99- this AM- losartan  was restarted 25 mg daily- but metoprolol  was not- I don't think Metoprolol  appropriate right now- HR is in 70's off metoprolol - so I think will start Norvasc 2.5 mg daily to help mildly as well- to AVOID HYPOTENSION 5/23- Pt's BP is running 140s-160s but slightly better than yesterday- won't increase meds yet, so doesn't cause hypotension 10. T2DM: Hgb A1C- 6.7. Monitor BS ac/hs and use SSI for elevated BS             --CM restrictions added to diet.   5/22- CBGs running 85-125- will con't to monitor 11. Hyperlipidemia: LDL @ goal. Continue Lipitor  80 mg 12. Prostate CA: Was on Flomax  0.8 mg till a few months ago.  --Has had hesitancy recently. Hx of retention in the past due to UTI.              --will monitor PVR w/bladder scan. Check UA             -Continue flomax , currently 0.4mg  daily 13. Class 1 Obesity: Educated on importance of activity and weight loss Body mass index is 29.23 kg/m. 14. H/o gout: STable on allopurinol . 15. Mood disorder: On Lexapro .   16. Hypokalemia  5/22- will replete K+ for 3.3 K+ level- 40 mEq x1. Will recheck Monday    I spent a total of 37   minutes on total care today- >50% coordination of care- due to  D/w nursing as well as IPOC done- and trying to wake pt up  LOS: 2 days A FACE TO FACE EVALUATION WAS PERFORMED  Oneisha Ammons 12/24/2023, 9:23 AM

## 2023-12-24 NOTE — Progress Notes (Signed)
 Inpatient Rehabilitation Center Individual Statement of Services  Patient Name:  Tyler Guerra.  Date:  12/23/2023  Welcome to the Inpatient Rehabilitation Center.  Our goal is to provide you with an individualized program based on your diagnosis and situation, designed to meet your specific needs.  With this comprehensive rehabilitation program, you will be expected to participate in at least 3 hours of rehabilitation therapies Monday-Friday, with modified therapy programming on the weekends.  Your rehabilitation program will include the following services:  Physical Therapy (PT), Occupational Therapy (OT), Speech Therapy (ST), 24 hour per day rehabilitation nursing, Neuropsychology, Care Coordinator, Rehabilitation Medicine, Nutrition Services, and Pharmacy Services  Weekly team conferences will be held on Tuesdays to discuss your progress.  Your Inpatient Rehabilitation Care Coordinator will talk with you frequently to get your input and to update you on team discussions.  Team conferences with you and your family in attendance may also be held.  Expected length of stay: 7-10 days  Overall anticipated outcome: Supervision - CGA overall and Moderate assistance for attention, awareness problem solving and memory.  Depending on your progress and recovery, your program may change. Your Inpatient Rehabilitation Care Coordinator will coordinate services and will keep you informed of any changes. Your Inpatient Rehabilitation Care Coordinator's name and contact numbers are listed  below.  The following services may also be recommended but are not provided by the Inpatient Rehabilitation Center:   Home Health Rehabiltiation Services Outpatient Rehabilitation Services   Arrangements will be made to provide these services after discharge if needed.  Arrangements include referral to agencies that provide these services.  Your insurance has been verified to be:  Medicare A+B Your primary doctor is:   Richrd Char, MD  Pertinent information will be shared with your doctor and your insurance company.  Inpatient Rehabilitation Care Coordinator:  Kathey Pang [phone] or [phone]  Information discussed with and copy given to patient by: Naoma Bacca, 12/24/2023, 8:22 AM

## 2023-12-24 NOTE — IPOC Note (Signed)
 Overall Plan of Care (IPOC) Patient Details Name: Tyler Guerra. MRN: [id number] DOB: 03-26-1951  Admitting Diagnosis: Stroke, small vessel [facility])  Hospital Problems: Principal Problem:   Stroke, small vessel (HCC)     Functional Problem List: Nursing Behavior  PT Balance, Behavior, Motor, Safety, Perception  OT Balance, Cognition, Endurance, Safety  SLP Cognition  TR         Basic ADL's: OT Eating, Grooming, Bathing, Dressing, Toileting     Advanced  ADL's: OT       Transfers: PT Bed Mobility, Bed to Chair, Customer service manager, Tub/Shower     Locomotion: PT Ambulation, Stairs     Additional Impairments: OT None  SLP Social Cognition   Problem Solving, Memory, Attention, Awareness  TR      Anticipated Outcomes Item Anticipated Outcome  Self Feeding set up  Swallowing      Basic self-care  SBA  Toileting  SBA   Bathroom Transfers SBA  Bowel/Bladder  pt will become continent of bowel and bladder with cues and assistance  Transfers  supervision  Locomotion  supervision  Communication     Cognition  mod A  Pain  pain level below 3  Safety/Judgment  patient will adhere to safety precautions with cues and reminders   Therapy Plan: PT Intensity: Minimum of 1-2 x/day ,45 to 90 minutes PT Frequency: 5 out of 7 days PT Duration Estimated Length of Stay: 7-10 days OT Intensity: Minimum of 1-2 x/day, 45 to 90 minutes OT Frequency: 5 out of 7 days OT Duration/Estimated Length of Stay: 7-10 days SLP Intensity: Minumum of 1-2 x/day, 30 to 90 minutes SLP Frequency: 3 to 5 out of 7 days SLP Duration/Estimated Length of Stay: 7-10 days   Team Interventions: Nursing Interventions Patient/Family Education  PT interventions Ambulation/gait training, Community reintegration, Fish farm manager, Neuromuscular re-education, Psychosocial support, Wheelchair propulsion/positioning, UE/LE Strength taining/ROM, Stair training, UE/LE Coordination  activities, Therapeutic Activities, Pain management, Skin care/wound management, Functional electrical stimulation, Discharge planning, Warden/ranger, Cognitive remediation/compensation, Disease management/prevention, Functional mobility training, Patient/family education, Splinting/orthotics, Therapeutic Exercise, Visual/perceptual remediation/compensation  OT Interventions Warden/ranger, DME/adaptive equipment instruction, Patient/family education, Therapeutic Activities, Wheelchair propulsion/positioning, Cognitive remediation/compensation, Functional electrical stimulation, Psychosocial support, Therapeutic Exercise, Community reintegration, Functional mobility training, Self Care/advanced ADL retraining, UE/LE Strength taining/ROM, Discharge planning, Neuromuscular re-education, Skin care/wound managment, UE/LE Coordination activities, Disease mangement/prevention, Pain management, Splinting/orthotics, Visual/perceptual remediation/compensation  SLP Interventions Cognitive remediation/compensation, Internal/external aids, Cueing hierarchy, Therapeutic Activities, Functional tasks, Patient/family education  TR Interventions    SW/CM Interventions Discharge Planning, Psychosocial Support, Patient/Family Education, Disease Management/Prevention   Barriers to Discharge MD  Medical stability, Home enviroment access/loayout, New diabetic, Lack of/limited family support, Weight, Medication compliance, and impaired cognition  Nursing Incontinence, Medication compliance, Behavior no stairs to enter, lives on main level. spouse to provider 24/7 care and 2 daughters to help  PT Home environment access/layout, Decreased caregiver support, Behavior    OT      SLP      SW Home environment access/layout 2 level, main living area w spouse   Team Discharge Planning: Destination: PT-Home ,OT- Home , SLP-Home Projected Follow-up: PT-Outpatient PT, OT-  Outpatient OT, 24 hour  supervision/assistance, SLP-Outpatient SLP, 24 hour supervision/assistance Projected Equipment Needs: PT-To be determined, OT- To be determined, SLP-None recommended by SLP Equipment Details: PT- , OT-  Patient/family involved in discharge planning: PT- Patient,  OT-Patient, SLP-Patient  MD ELOS: 7-10 days Medical Rehab Prognosis:  Good Assessment: The patient has been admitted  for CIR therapies with the diagnosis of R parietal CVA. The team will be addressing functional mobility, strength, stamina, balance, safety, adaptive techniques and equipment, self-care, bowel and bladder mgt, patient and caregiver education, cognition. Goals have been set at SBA but mod A for cognition. Anticipated discharge destination is home with family.        See Team Conference Notes for weekly updates to the plan of care

## 2023-12-24 NOTE — Progress Notes (Signed)
 Inpatient Rehabilitation Care Coordinator Assessment and Plan Patient Details  Name: Tyler Guerra. MRN: [id number] Date of Birth: [birth year]  Today's Date: 12/23/2023  Hospital Problems: Principal Problem:   Stroke, small vessel [facility])  Past Medical History:  Past Medical History:  Diagnosis Date   Anxiety state 03/16/2016   Aortic stenosis    Mild to moderate by echo 12/2023 with mean aortic valve gradient 12 mmHg   Aphasia 01/26/2017   Benign localized prostatic hyperplasia with lower urinary tract symptoms (LUTS)    BPV (benign positional vertigo) 11/02/2014   Carotid artery stenosis    PER DUPLEX 10-22-2015  BILATERAL ICA 1-39%   Cataract    Coronary artery disease    CABG 2000 with LIMA> LAD, SVG > RCA and free radial > OM. 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   Gout    Gout 07/18/2010   Qualifier: Diagnosis of  By: Vallarie Gauze MD, Tyrone Gallop     H/O eye injury    chronic changes to left eye after injury   Hand weakness 03/02/2018   Hearing loss 04/21/2013   History of TIA (transient ischemic attack)    03/ 2017  no residual after brief episode loss peripheral vision   HOH (hard of hearing)    Hyperglycemia 05/17/2016   Hyperlipemia    Hyperlipidemia 07/18/2010   Qualifier: Diagnosis of  By: Vallarie Gauze MD, Graham     Hypertension    HYPERTENSION, BENIGN ESSENTIAL 07/18/2010   Qualifier: Diagnosis of  By: Vallarie Gauze MD, Graham     Hypokalemia 01/26/2017   Knee pain 03/13/2019   Left shoulder pain 09/04/2018   Pneumothorax 10/11/2019   RIGHT    Prostate cancer (HCC) UROLOGIST-  DR GRAPEY/  ONCOLOGIST-  DR MANNING   dx 2015--- Stage T1c, Gleason 3+4, PSA 4.03, vol 44cc    PVC's (premature ventricular contractions)    Rash and nonspecific skin eruption 10/14/2015   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   S/P CABG x 05 Dec 1998   Skin lesion 10/14/2015   Stroke [facility])    TIA (transient ischemic attack) 10/14/2015   Ventral hernia 10/26/2011   Wears glasses    Past Surgical History:  Past Surgical History:  Procedure Laterality Date   APPENDECTOMY  1975   CARDIAC CATHETERIZATION  12-22-2001   dr Kay Parson   severe native vessel disease mLAD 60-70%,  total occulsion pCFX  and pRCA/  widely patent saphenous vein , free radial , and LIMA grafts/  minminal lv dysfunction, ef 60%   CHEST TUBE INSERTION Right 10/11/2019   CORONARY ARTERY BYPASS GRAFT  12/1998   LIMA to LAD,  SVG to PDA and Diagonal, Free radial graft to OM   CORONARY STENT INTERVENTION N/A 05/26/2021   Procedure: CORONARY STENT INTERVENTION;  Surgeon: Kyra Phy, MD;  Location: [facility] INVASIVE CV LAB;  Service: Cardiovascular;  Laterality: N/A;   Exericse treadmill test  last one 01-03-2014  dr Kay Parson   normal exercise tolerance w/ hypertensive repsonse,  no ischemic EKG changes, appropriate HR response & recovery ([facility] TM score 9;  Low Risk , PVC's w/ exertion)   EYE SURGERY  FRACTURE SURGERY     LEFT HEART CATH AND CORS/GRAFTS ANGIOGRAPHY N/A 05/26/2021   Procedure: LEFT HEART CATH AND CORS/GRAFTS ANGIOGRAPHY;  Surgeon: Kyra Phy, MD;  Location: [facility] INVASIVE CV LAB;  Service: Cardiovascular;  Laterality: N/A;   LOOP RECORDER INSERTION N/A 12/22/2022   Procedure: LOOP RECORDER INSERTION;  Surgeon: Tammie Fall, MD;  Location: [facility] INVASIVE CV LAB;  Service: Cardiovascular;  Laterality: N/A;   PROSTATE BIOPSY     RADIOACTIVE SEED IMPLANT N/A 01/13/2016   Procedure: RADIOACTIVE SEED IMPLANT/BRACHYTHERAPY IMPLANT;  Surgeon: Ann Barnacle, MD;  Location: [facility];  Service: Urology;  Laterality: N/A;   Social History:  reports that he has never  smoked. He has never used smokeless tobacco. He reports that he does not drink alcohol  and does not use drugs.  Family / Support Systems Marital Status: Married Patient Roles: Spouse Spouse/Significant Other: Designer, fashion/clothing Children: 2 daughters Other Supports: family and friends Ability/Limitations of Caregiver: No physical limitations noted Caregiver Availability: 24/7 Family Dynamics: Good support system  Social History Preferred language: English Religion: Catholic Cultural Background: Building control surveyor - How often do you need to have someone help you when you read instructions, pamphlets, or other written material from your doctor or pharmacy?: Never Writes: Yes Employment Status: Retired Marine scientist Issues: n/a Guardian/Conservator: n/a   Abuse/Neglect Abuse/Neglect Assessment Can Be Completed: Unable to assess, patient is non-responsive or altered mental status Physical Abuse: Denies Verbal Abuse: Denies Sexual Abuse: Denies Exploitation of patient/patient's resources: Denies Self-Neglect: Denies  Patient response to: Social Isolation - How often do you feel lonely or isolated from those around you?: Never  Emotional Status Pt's affect, behavior and adjustment status: Cheerful affect but fidgeting Recent Psychosocial Issues: None Psychiatric History: History of depression and anxiety Substance Abuse History: N/A  Patient / Family Perceptions, Expectations & Goals Pt/Family understanding of illness & functional limitations: Wife reports a good understanding of the current illness and functional limitations Premorbid pt/family roles/activities: Spouse, father Anticipated changes in roles/activities/participation: Wife reports she is aware of the increased burden of care associated with the CVA due to previous CVA; patient able to manage meds, finances, etc before the first stroke a year ago. Since the stroke 12/2022; wife has managed medications,  finances, driving, and home management. Pt/family expectations/goals: Wife would like for the patient to return to as close to the post initial stroke functional state if possible; able to manage mobility, toileting, clothes management, etc at a mod I level. She will manage medications, meals, home management,etc.  [employer]: None Premorbid Home Care/DME Agencies: None Transportation available at discharge: Wife will provide transportation at discharge; patient did not drive PTA Is the patient able to respond to transportation needs?: No In the past 12 months, has lack of transportation kept you from medical appointments or from getting medications?: No In the past 12 months, has lack of transportation kept you from meetings, work, or from getting things needed for daily living?: No Resource referrals recommended: Neuropsychology, Support group (specify) (Stroke Support Group)  Discharge Planning Living Arrangements: Spouse/significant other Support Systems: Spouse/significant other, Children Type of Residence: Private residence Insurance Resources: [employer] Screen Referred: No Living Expenses: Lives with family Money Management: Spouse Does the patient have any problems obtaining your medications?: No Home Management: Spouse manages the home Patient/Family Preliminary Plans: D/C plan to return home with wife; 2 dtrs live local and can assist with care Care Coordinator Barriers to Discharge: Home environment access/layout Care Coordinator  Barriers to Discharge Comments: 2 level, main living area w spouse Care Coordinator Anticipated Follow Up Needs: HH/OP DC Planning Additional Notes/Comments: n/a Expected length of stay: 7-10 days with goal of supervision - CGA overall and mod assist for cognition  Clinical Impression Patient appears slightly confused; fidgeting and reaching for multiple items off tray. Easily distractible but does follow some  commands.  Wife notes "he was like this after the first stroke and it cleared over time".   Naoma Bacca 12/24/2023, 8:17 AM

## 2023-12-24 NOTE — Progress Notes (Signed)
 Speech Language Pathology Daily Session Note  Patient Details  Name: Tyler Guerra. MRN: [id number] Date of Birth: 01/22/1951  Today's Date: 12/24/2023 SLP Individual Time: 0900-0958 SLP Individual Time Calculation (min): 58 min  Short Term Goals: Week 1: SLP Short Term Goal 1 (Week 1): STG = LTG due to ELOS  Skilled Therapeutic Interventions: Skilled therapy session focused on cognitive goals. Upon entrance, patient asleep however easily woken. Patient requested transfer to [facility] and continent of bladder. Patient sat up on side of bed to consume portions of regular/thin breakfast as he had not eaten. Patient consumed both L and R sides of plate independently, therefore showing improvements in L attention. SLP continued to target cognitive goals through simple money management task. Patient required modA to count change according to verbalized amount. In addtional minutes of the session, SLP introduced memory book and its use. SLP faciliated use for todays ST sesison. Patient left in bed with alarm set and call bell in reach. Continue POC  Pain Denies  Therapy/Group: Individual Therapy  Elloise Roark M.A., CCC-SLP 12/24/2023, 6:44 AM

## 2023-12-24 NOTE — Progress Notes (Signed)
 Occupational Therapy Session Note  Patient Details  Name: Tyler Guerra. MRN: [id number] Date of Birth: Oct 09, 1950  Today's Date: 12/24/2023 OT Individual Time: 1000-1115 OT Individual Time Calculation (min): 75 min    Short Term Goals: Week 1:  OT Short Term Goal 1 (Week 1): STG=LTG d/t ELOS  Skilled Therapeutic Interventions/Progress Updates:    Pt seated EOB upon arrival and agreeable to therapy. Skilled OT services with focus on functional amb without AD, standing balance, bathing at shower level (standing), dressing with sit<>stand from seat, following one step commands, table tasks for pattern replication, and number sequencing tasks in moderately noisy environment to increase indepenence with BADLs. BAthing at shower level with close supervision while standing in shower. No LOB noted. Dressing with sit<>stand. Table tasks in gym replicated moderately complex pattern with large lego blocks. Pt required more then a reasonable amount of time to complete task. Pt recognized that the task took longer then he expected and longer then it should have. Pt amb to ortho gym for BITS activity-number sequencing. Pt required more then a reasonable amount of time to search for number and lost track of number at 13. Pt easily distracted in environment. During amb back to room, pt easily distracted by pictures on wall and other traffic in hallway. Returned to room. Wife and sister present. Reviewed focus on session. Pt reamined seated EOB with bed alarm activated. Wife and sister present.   Therapy Documentation Precautions:  Precautions Precautions: Fall Recall of Precautions/Restrictions: Impaired Precaution/Restrictions Comments: pt is impulsive Restrictions Weight Bearing Restrictions Per Provider Order: No   Pain: Pt denies pain this morning  Therapy/Group: Individual Therapy  [name] 12/24/2023, 11:23 AM

## 2023-12-24 NOTE — Plan of Care (Signed)
  Problem: RH BOWEL ELIMINATION Goal: RH STG MANAGE BOWEL WITH ASSISTANCE Description: STG Manage Bowel with Assistance. Outcome: Progressing Goal: RH STG MANAGE BOWEL W/MEDICATION W/ASSISTANCE Description: STG Manage Bowel with Medication with Assistance. Outcome: Progressing   Problem: RH BLADDER ELIMINATION Goal: RH STG MANAGE BLADDER WITH ASSISTANCE Description: STG Manage Bladder With Assistance Outcome: Progressing Goal: RH STG MANAGE BLADDER WITH EQUIPMENT WITH ASSISTANCE Description: STG Manage Bladder With Equipment With Assistance Outcome: Progressing   Problem: RH SKIN INTEGRITY Goal: RH STG SKIN FREE OF INFECTION/BREAKDOWN Outcome: Progressing Goal: RH STG MAINTAIN SKIN INTEGRITY WITH ASSISTANCE Description: STG Maintain Skin Integrity With Assistance. Outcome: Progressing   Problem: RH SAFETY Goal: RH STG ADHERE TO SAFETY PRECAUTIONS W/ASSISTANCE/DEVICE Description: STG Adhere to Safety Precautions With Assistance/Device. Outcome: Progressing Goal: RH STG DECREASED RISK OF FALL WITH ASSISTANCE Description: STG Decreased Risk of Fall With Assistance. Outcome: Progressing   Problem: RH PAIN MANAGEMENT Goal: RH STG PAIN MANAGED AT OR BELOW PT'S PAIN GOAL Outcome: Progressing   Problem: RH KNOWLEDGE DEFICIT GENERAL Goal: RH STG INCREASE KNOWLEDGE OF SELF CARE AFTER HOSPITALIZATION Outcome: Progressing   Problem: Education: Goal: Ability to describe self-care measures that may prevent or decrease complications (Diabetes Survival Skills Education) will improve Outcome: Progressing Goal: Individualized Educational Video(s) Outcome: Progressing   Problem: Coping: Goal: Ability to adjust to condition or change in health will improve Outcome: Progressing   Problem: Fluid Volume: Goal: Ability to maintain a balanced intake and output will improve Outcome: Progressing   Problem: Health Behavior/Discharge Planning: Goal: Ability to identify and utilize available  resources and services will improve Outcome: Progressing Goal: Ability to manage health-related needs will improve Outcome: Progressing   Problem: Metabolic: Goal: Ability to maintain appropriate glucose levels will improve Outcome: Progressing   Problem: Nutritional: Goal: Maintenance of adequate nutrition will improve Outcome: Progressing Goal: Progress toward achieving an optimal weight will improve Outcome: Progressing   Problem: Skin Integrity: Goal: Risk for impaired skin integrity will decrease Outcome: Progressing   Problem: Tissue Perfusion: Goal: Adequacy of tissue perfusion will improve Outcome: Progressing

## 2023-12-24 NOTE — Progress Notes (Signed)
 Physical Therapy Session Note  Patient Details  Name: Tyler Guerra. MRN: [id number] Date of Birth: Dec 16, 1950  Today's Date: 12/24/2023 PT Individual Time: 1420-1530 PT Individual Time Calculation (min): 70 min   Short Term Goals: Week 1:  PT Short Term Goal 1 (Week 1): =LTGs d/t ELOS  Skilled Therapeutic Interventions/Progress Updates:    pt received in bed and agreeable to therapy. No complaint of pain. Session focused on gait and balance, including out come measures.  Timed Up and Go: Instructed to pt walk as quickly but as safely as possible around cone placed 10 ft from seat using LRAD. Pt performed 1 practice round, and then 3 timed trials. Results: Trial 1: 12.06 seconds Trial 2: 13.27 seconds Trial 3: 12.2 seconds Average: 12.51 seconds   Patient demonstrates increased fall risk as noted by score of   36/56 on Berg Balance Scale.  (<36= high risk for falls, close to 100%; 37-45 significant >80%; 46-51 moderate >50%; 52-55 lower >25%)  Pt ambulated without AD with CGA to supervision. Pt cued for head turns and engaged in conversation for dual task, with noted decrease in speed. Pt with LOB only in loud and distracting environments during this session.   Pt used rebounder with 6lb ball for reactive balance, 3 x 20. Cues for technique and to maintain full upright position. Noted pt slightly out of breath after each bout, recovered with seated rest break.  Pt also used nustep x 12 min at level 8 for global endurance and reciprocal stepping.   Pt returned to room and to bed, was left with all needs in reach and alarm active.    Therapy Documentation Precautions:  Precautions Precautions: Fall Recall of Precautions/Restrictions: Impaired Precaution/Restrictions Comments: pt is impulsive Restrictions Weight Bearing Restrictions Per Provider Order: No General:    Balance: Standardized Balance Assessment Standardized Balance Assessment: Berg Balance Test;Timed Up and  Go Test Berg Balance Test Sit to Stand: Able to stand  independently using hands Standing Unsupported: Able to stand 2 minutes with supervision Sitting with Back Unsupported but Feet Supported on Floor or Stool: Able to sit safely and securely 2 minutes Stand to Sit: Controls descent by using hands Transfers: Able to transfer safely, definite need of hands Standing Unsupported with Eyes Closed: Able to stand 10 seconds with supervision Standing Ubsupported with Feet Together: Able to place feet together independently and stand for 1 minute with supervision From Standing, Reach Forward with Outstretched Arm: Can reach forward >12 cm safely (5") From Standing Position, Pick up Object from Floor: Able to pick up shoe, needs supervision From Standing Position, Turn to Look Behind Over each Shoulder: Looks behind one side only/other side shows less weight shift Turn 360 Degrees: Needs close supervision or verbal cueing Standing Unsupported, Alternately Place Feet on Step/Stool: Able to stand independently and complete 8 steps >20 seconds Standing Unsupported, One Foot in Front: Loses balance while stepping or standing Standing on One Leg: Tries to lift leg/unable to hold 3 seconds but remains standing independently Total Score: 36 Timed Up and Go Test TUG: Normal TUG Normal TUG (seconds): 12.51     Therapy/Group: Individual Therapy  Tex Filbert 12/24/2023, 4:01 PM

## 2023-12-25 DIAGNOSIS — I639 Cerebral infarction, unspecified: Secondary | ICD-10-CM | POA: Diagnosis not present

## 2023-12-25 DIAGNOSIS — I10 Essential (primary) hypertension: Secondary | ICD-10-CM | POA: Diagnosis not present

## 2023-12-25 DIAGNOSIS — R739 Hyperglycemia, unspecified: Secondary | ICD-10-CM

## 2023-12-25 LAB — GLUCOSE, CAPILLARY
Glucose-Capillary: 92 mg/dL (ref 70–99)
Glucose-Capillary: 133 mg/dL — ABNORMAL HIGH (ref 70–99)
Glucose-Capillary: 95 mg/dL (ref 70–99)
Glucose-Capillary: 154 mg/dL — ABNORMAL HIGH (ref 70–99)

## 2023-12-25 NOTE — Progress Notes (Signed)
 PROGRESS NOTE   Subjective/Complaints:  Pt doing well, slept well, denies pain, LBM yesterday per pt but not documented. Urinating fine, no other complaints or concerns.   ROS:  Negative except for HPI Denies CP, SOB, abd pain, N/V/D/C, or any other complaints at this time.    Objective:   No results found. Recent Labs    12/23/23 0636  WBC 6.4  HGB 13.2  HCT 38.4*  PLT 174   Recent Labs    12/23/23 0636  NA 141  K 3.3*  CL 109  CO2 24  GLUCOSE 113*  BUN 15  CREATININE 1.18  CALCIUM  9.4    Intake/Output Summary (Last 24 hours) at 12/25/2023 1034 Last data filed at 12/24/2023 2332 Gross per 24 hour  Intake 295 ml  Output 300 ml  Net -5 ml        Physical Exam: Vital Signs Blood pressure (!) 141/87, pulse 77, temperature 98.3 F (36.8 C), temperature source Oral, resp. rate 17, height 5\' 10"  (1.778 m), weight 92.4 kg, SpO2 99%.     General: awake, alert, comfortable in bedside chair; NAD HENT: conjugate gaze; oropharynx moist, a little HOH CV: regular rate and rhythm; no JVD Pulmonary: CTA B/L; no W/R/R- good air movement GI: soft, NT, ND, (+)BS Psychiatric: appropriate, calm, cooperative.  Ext: no peripheral edema  PRIOR EXAMS: Neurological: keeps falling asleep decreased memory and tangential- word substitutions noted, some stuttering; mild LUE weakness- 4+/5 but LLE 5/5- some apraxia noted  Mental Status: AAO to May 2025, note date, not city, Hospital, knows its Wednesday, decreased memory, Able to name president, able to provide his birthday. Delayed responses, Poor safety awareness. Distractible and poor attention. Confused.  R gaze preference  Speech/Languate: Naming and repetition intact, fluent, follows simple commands but has more difficulty with multi step or complex commands CRANIAL NERVES: II: PERRL. Visual fields full III, IV, VI: EOM overall intact, requires multiple attempts to  complete V: normal sensation bilaterally VII: Mild left facial weakness  VIII: Hard of hearing IX, X: normal palatal elevation XI: 5/5 head turn and 5/5 shoulder shrug bilaterally XII: Tongue midline     MOTOR: RUE: 5/5 Deltoid, 5/5 Biceps, 5/5 Triceps,5/5 Grip LUE: 5/5 Deltoid, 4+/5 Biceps, 4+/5 Triceps, 4+/5 Grip RLE: HF 5/5, KE 5/5, ADF 5/5, APF 5/5 LLE: HF 5/5, KE 5/5, ADF 5/5, APF 5/5   Possible left LE apraxia    REFLEXES: No ankle clonus   SENSORY: Normal to touch all 4 extremities   No hypertonia noted   Assessment/Plan: 1. Functional deficits which require 3+ hours per day of interdisciplinary therapy in a comprehensive inpatient rehab setting. Physiatrist is providing close team supervision and 24 hour management of active medical problems listed below. Physiatrist and rehab team continue to assess barriers to discharge/monitor patient progress toward functional and medical goals  Care Tool:  Bathing    Body parts bathed by patient: Right arm, Left arm, Chest, Abdomen, Front perineal area, Buttocks, Right upper leg, Left upper leg, Right lower leg, Left lower leg, Face         Bathing assist Assist Level: Supervision/Verbal cueing     Upper Body Dressing/Undressing Upper body  dressing   What is the patient wearing?: Pull over shirt    Upper body assist Assist Level: Supervision/Verbal cueing    Lower Body Dressing/Undressing Lower body dressing      What is the patient wearing?: Underwear/pull up, Pants     Lower body assist Assist for lower body dressing: Supervision/Verbal cueing     Toileting Toileting    Toileting assist Assist for toileting: Supervision/Verbal cueing     Transfers Chair/bed transfer  Transfers assist     Chair/bed transfer assist level: Contact Guard/Touching assist     Locomotion Ambulation   Ambulation assist      Assist level: Minimal Assistance - Patient > 75% Assistive device: No Device Max  distance: 200   Walk 10 feet activity   Assist     Assist level: Minimal Assistance - Patient > 75% Assistive device: No Device   Walk 50 feet activity   Assist    Assist level: Minimal Assistance - Patient > 75% Assistive device: No Device    Walk 150 feet activity   Assist    Assist level: Minimal Assistance - Patient > 75% Assistive device: No Device    Walk 10 feet on uneven surface  activity   Assist     Assist level: Minimal Assistance - Patient > 75%     Wheelchair     Assist Is the patient using a wheelchair?: Yes Type of Wheelchair: Manual    Wheelchair assist level: Supervision/Verbal cueing Max wheelchair distance: 150    Wheelchair 50 feet with 2 turns activity    Assist        Assist Level: Supervision/Verbal cueing   Wheelchair 150 feet activity     Assist      Assist Level: Supervision/Verbal cueing   Blood pressure (!) 141/87, pulse 77, temperature 98.3 F (36.8 C), temperature source Oral, resp. rate 17, height 5\' 10"  (1.778 m), weight 92.4 kg, SpO2 99%.  Medical Problem List and Plan: 1. 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  Antithrombotics: -DVT/anticoagulation:  Pharmaceutical: Lovenox  40mg  daily -antiplatelet therapy:  ASA and Ticagrelor  X 4 weeks followed by ASA alone 3. Pain Management: Tylenol  prn.   5/22- denies pain 4. Mood/Behavior/Sleep: LCSW to follow for evaluation and support.              -antipsychotic agents: N/A             -Continue lexapro  5mg  daily 5. Neuropsych/cognition: This patient may be intermittently capable of making decisions on his own behalf. 6. Skin/Wound Care: Routine pressure relief measures.  7. Fluids/Electrolytes/Nutrition: Monitor I/O. Monitor  labs, continue vitamins/supplements  -12/23/23 hypokalemia as below #16, remainder of labs unremarkable 8. Delirium: Per family has had issues with confusion since yesterday. Low grade fevers are resolving             --Work up 05/20-->CXR- neg. Procal <0.10  WBC 7.1 --Monitor sleep wake disruption (has not slept for more than 24 hours per wife). Tele sitter ordered for fall prevention. Seroquel 25mg  nightly added to help with sleep.  5/22-  T 99.3 this AM, but otherwise no temps in 24 hours- WBC is stable- 6.4- rechecking U/A and Cx- could be due to stroke, however wife per chart, felt it came AFTER stroke- but admitted was having issues at home prior- also per chart. Will get in SLP to tease out what's going on.   5/23- last CXR from 5/20 negative and U/A and UCx 5/22 (-) for UTI 9. HTN: Monitor BP TID--remains labile. Monitor with activity.  --Continue Cozaar  and avoid hypotension due to intracranial atherosclerosis.  5/22-out of permissive HTN timing-  BP running up to 170/99- this AM- losartan  was restarted 25 mg daily- but metoprolol  was not- I don't think Metoprolol  appropriate right now- HR is in 70's off metoprolol - so I think will start Norvasc 2.5 mg daily to help mildly as well- to AVOID HYPOTENSION 5/23- Pt's BP is running 140s-160s but slightly better than yesterday- won't increase meds yet, so doesn't cause hypotension -12/25/23 BPs fairly stable, monitor trend given recent med changes Vitals:   12/22/23 1718 12/22/23 1941 12/23/23 0427 12/23/23 1334  BP: (!) 167/96 (!) 150/94 (!) 170/99 (!) 146/99   12/23/23 2015 12/24/23 0609 12/24/23 1317 12/24/23 1940  BP: (!) 163/93 (!) 157/79 126/80 129/79   12/25/23 0428  BP: (!) 141/87    10. T2DM: Hgb A1C- 6.7. Monitor BS ac/hs and use SSI for elevated BS             --CM restrictions added to diet.   5/22- CBGs running 85-125- will con't to monitor -12/25/23 CBGs fine, using very little insulin  in last 24hrs; monitor for now, but may  eventually d/c checks given decent A1C CBG (last 3)  Recent Labs    12/24/23 1625 12/24/23 2042 12/25/23 0638  GLUCAP 132* 129* 92    11. Hyperlipidemia: LDL @ goal. Continue Lipitor  80 mg 12. Prostate CA: Was on Flomax  0.8 mg till a few months ago.  --Has had hesitancy recently. Hx of retention in the past due to UTI.              --will monitor PVR w/bladder scan. Check UA-- neg 5/23             -Continue flomax , currently 0.4mg  daily -12/25/23 low PVRs, monitor another day but could d/c this as well if continued to improve.  13. Class 1 Obesity: Educated on importance of activity and weight loss Body mass index is 29.23 kg/m. 14. H/o gout: Stable on allopurinol  300mg  daily 15. Mood disorder: On Lexapro  5mg  daily.  16. Hypokalemia  5/22- will replete K+ for 3.3 K+ level- 40 mEq x1. Will recheck Monday 17. GI ppx: protonix  40mg  daily    LOS: 3 days A FACE TO FACE EVALUATION WAS PERFORMED  [address] 12/25/2023, 10:34 AM

## 2023-12-25 NOTE — Progress Notes (Signed)
 Physical Therapy Session Note  Patient Details  Name: Tyler Guerra. MRN: [id number] Date of Birth: 06-19-1951  Today's Date: 12/25/2023 PT Individual Time: 1021-1120 PT Individual Time Calculation (min): 59 min   Short Term Goals: Week 1:  PT Short Term Goal 1 (Week 1): =LTGs d/t ELOS  Skilled Therapeutic Interventions/Progress Updates: Patient sitting in recliner with family present on entrance to room. Patient alert and agreeable to PT session.   Patient reported no pain during session.  Therapeutic Activity: Bed Mobility: Pt performed sit<supine from EOB with supervision. Transfers: Pt performed sit<>stand transfers throughout session with CGA for safety. Provided VC for hand placement to control descent.  Pt ambulated from room<>day room gym using no AD with CGA to improve household ambulatory tolerance and balance  Neuromuscular Re-ed: NMR facilitated during session with focus on dual tasking, coordination, dynamic standing balance. - Bean bag toss to [employer] with L UE (pt RH dominant) in order to increase nueroplasticity with education provided. Pt also on airex pad. Pt tossed 3 rounds with CGA for safety, and then cued to pick up bags from floor/board (CGA and demonstration of forward weight shift and flexion without LOB). Pt also cued to increase L UE extension with increase in distance/coordination to hole. Rest break required after 2nd round. - Pt ambulating around day room/nsg loop while balancing soft red ball on tennis racket with CGA. Pt required visual tracking of ball to maintain on racket (dropped once and picked up CGA). Pt then cued to say the alphabet in sequential order. Pt required cues to say alphabet while ambulating in slower pace. Pt had trouble finding next letter towards end of alphabet (pt reported this was similar issue prior to current admission). Pt performed in minimally distracting environment. - Administrator, [employer]  - Bottom corners with pong ball  on squigs (concavity facing up to hold) and other random colors above waist height. Pt cued to perform single leg stance to tap either LE to ball without knocking over (performed this until adjusting to balance, then cued to touch colors above - sequence of touching squigs on top first, then touching ball). Pt with minA throughout and cues to softly tap ball.  NMR performed for improvements in motor control and coordination, balance, sequencing, judgement, and self confidence/ efficacy in performing all aspects of mobility at highest level of independence.   Patient supine in bed at end of session with brakes locked, family present, bed alarm set, and all needs within reach.      Therapy Documentation Precautions:  Precautions Precautions: Fall Recall of Precautions/Restrictions: Impaired Precaution/Restrictions Comments: pt is impulsive Restrictions Weight Bearing Restrictions Per Provider Order: No  Therapy/Group: Individual Therapy  Osmar Howton PTA 12/25/2023, 12:41 PM

## 2023-12-25 NOTE — Progress Notes (Signed)
 Occupational Therapy Session Note  Patient Details  Name: Tyler Guerra. MRN: [id number] Date of Birth: 09-06-1950  Today's Date: 12/25/2023 OT Individual Time: 4098-1191 OT Individual Time Calculation (min): 75 min    Short Term Goals: Week 1:  OT Short Term Goal 1 (Week 1): STG=LTG d/t ELOS  Skilled Therapeutic Interventions/Progress Updates:     Patient resting in bed at the time of arrival, patient indicated that he rested well with no c/o pain. Patient is HOH, when I inquired about his hearing aids, he indicated his wife would be bring hearing aids later to day.  Patient was in agreement with completing a  BADL related task in showering to start his day during this session. Tele- sitter  was contacted for privacy during BADL task performance.   The pt was able to transition from supine in bed to EOB with SBA. The pt was able to come from EOB to standing using the RW and the bed rail for additional balance with initial cues for hand placement as a safety measure.  The pt was able to ambulate to the restroom with CGA using the RW.  The pt was able to enter the shower using the grab bar for placement onto the shower chair with initial cues for safely positioning himself onto shower chair.  The pt was able to doff his UB clothing items, an over head shirt with close S, he was able to doff his shorts, underwear, and non-skid socks with SBA. The pt was able to bathe UB/LB with close S.  The pt was able to dry himself off with s/uA. The pt exited the shower using the grab bar and was able to ambulate to EOB using the RW for dressing EOB at CGA.  The pt was able to donn his over head shirt with s/uA, his was able to donn his underwear, pants and non-skid socks with s/uA.The pt was able to ambulate to the recliner using the RW with close S.  The call light and bedside table were placed within reach with all additional needs addressed prior to exiting the pt's room.  Therapy  Documentation Precautions:  Precautions Precautions: Fall Recall of Precautions/Restrictions: Impaired Precaution/Restrictions Comments: pt is impulsive Restrictions Weight Bearing Restrictions Per Provider Order: No  Therapy/Group: Individual Therapy  Moises Ang 12/25/2023, 12:19 PM

## 2023-12-25 NOTE — Plan of Care (Signed)
  Problem: Consults Goal: RH STROKE PATIENT EDUCATION Description: See Patient Education module for education specifics  Outcome: Progressing Goal: Nutrition Consult-if indicated Outcome: Progressing   Problem: RH BOWEL ELIMINATION Goal: RH STG MANAGE BOWEL WITH ASSISTANCE Description: STG Manage Bowel with min Assistance. Outcome: Progressing Goal: RH STG MANAGE BOWEL W/MEDICATION W/ASSISTANCE Description: STG Manage Bowel with Medication with min Assistance. Outcome: Progressing   Problem: RH BLADDER ELIMINATION Goal: RH STG MANAGE BLADDER WITH ASSISTANCE Description: STG Manage Bladder With min Assistance Outcome: Progressing   Problem: RH SKIN INTEGRITY Goal: RH STG SKIN FREE OF INFECTION/BREAKDOWN Description: Prevention of skin break down or infection with min assist Outcome: Progressing   Problem: RH SAFETY Goal: RH STG ADHERE TO SAFETY PRECAUTIONS W/ASSISTANCE/DEVICE Description: STG Adhere to Safety Precautions With min Assistance/Device. Outcome: Progressing   Problem: RH PAIN MANAGEMENT Goal: RH STG PAIN MANAGED AT OR BELOW PT'S PAIN GOAL Description: Pain scale <3/10 Outcome: Progressing   Problem: RH KNOWLEDGE DEFICIT Goal: RH STG INCREASE KNOWLEDGE OF HYPERTENSION Description: Patient will be able to verbalize ways to manage hypertension with min assist Outcome: Progressing Goal: RH STG INCREASE KNOWLEGDE OF HYPERLIPIDEMIA Description: Patient will be able to verbalize medication for hyperlipidimia with min assist Outcome: Progressing Goal: RH STG INCREASE KNOWLEDGE OF STROKE PROPHYLAXIS Description: Patient will be able to name stroke prevention medications with min assist Outcome: Progressing

## 2023-12-25 NOTE — Progress Notes (Signed)
 Speech Language Pathology Daily Session Note  Patient Details  Name: Tyler Guerra. MRN: [id number] Date of Birth: 04/23/1951  Today's Date: 12/25/2023 SLP Individual Time: 1300-1356 SLP Individual Time Calculation (min): 56 min  Short Term Goals: Week 1: SLP Short Term Goal 1 (Week 1): STG = LTG due to ELOS  Skilled Therapeutic Interventions: SLP conducted skilled therapy session targeting cognitive retraining goals. Upon SLP entry, patient finishing lunch meal. SLP encouraged patient to continue eating and engaged patient in conversation-level tasks targeting recall of daily events and orientation. Utilizing room calendar aid, patient oriented to date with mod assist. Patient sustained attention to conversation for 15 minutes with min cues and exhibited no cognitive difficulty with completion of meal. After completion of meal, patient engaged in completing memory book log from previous day and from earlier sessions this date. Patient recalled events from prior therapy sessions with mod to max assist. SLP then facilitated sustained attention to detail task requiring scanning and thorough completion of one-step directions using a 3-columned word list. Patient benefited from heavy mod assist for interpretation of instructions and thoroughness. Patient was left in room with call bell in reach and alarm set. SLP will continue to target goals per plan of care.        Pain Pain Assessment Pain Scale: 0-10 Pain Score: 0-No pain  Therapy/Group: Individual Therapy  Tyler Guerra, M.A., CCC-SLP  Tyler Guerra 12/25/2023, 1:57 PM

## 2023-12-26 DIAGNOSIS — I639 Cerebral infarction, unspecified: Secondary | ICD-10-CM | POA: Diagnosis not present

## 2023-12-26 DIAGNOSIS — I10 Essential (primary) hypertension: Secondary | ICD-10-CM | POA: Diagnosis not present

## 2023-12-26 DIAGNOSIS — R739 Hyperglycemia, unspecified: Secondary | ICD-10-CM | POA: Diagnosis not present

## 2023-12-26 LAB — GLUCOSE, CAPILLARY
Glucose-Capillary: 86 mg/dL (ref 70–99)
Glucose-Capillary: 116 mg/dL — ABNORMAL HIGH (ref 70–99)
Glucose-Capillary: 109 mg/dL — ABNORMAL HIGH (ref 70–99)
Glucose-Capillary: 92 mg/dL (ref 70–99)

## 2023-12-26 NOTE — Plan of Care (Signed)
  Problem: Consults Goal: RH STROKE PATIENT EDUCATION Description: See Patient Education module for education specifics  Outcome: Progressing Goal: Nutrition Consult-if indicated Outcome: Progressing   Problem: RH BOWEL ELIMINATION Goal: RH STG MANAGE BOWEL WITH ASSISTANCE Description: STG Manage Bowel with min Assistance. Outcome: Progressing Goal: RH STG MANAGE BOWEL W/MEDICATION W/ASSISTANCE Description: STG Manage Bowel with Medication with min Assistance. Outcome: Progressing   Problem: RH BLADDER ELIMINATION Goal: RH STG MANAGE BLADDER WITH ASSISTANCE Description: STG Manage Bladder With min Assistance Outcome: Progressing   Problem: RH SKIN INTEGRITY Goal: RH STG SKIN FREE OF INFECTION/BREAKDOWN Description: Prevention of skin break down or infection with min assist Outcome: Progressing   Problem: RH PAIN MANAGEMENT Goal: RH STG PAIN MANAGED AT OR BELOW PT'S PAIN GOAL Description: Pain scale <3/10 Outcome: Progressing   Problem: RH SAFETY Goal: RH STG ADHERE TO SAFETY PRECAUTIONS W/ASSISTANCE/DEVICE Description: STG Adhere to Safety Precautions With min Assistance/Device. Outcome: Progressing   Problem: RH KNOWLEDGE DEFICIT Goal: RH STG INCREASE KNOWLEDGE OF HYPERTENSION Description: Patient will be able to verbalize ways to manage hypertension with min assist Outcome: Progressing Goal: RH STG INCREASE KNOWLEGDE OF HYPERLIPIDEMIA Description: Patient will be able to verbalize medication for hyperlipidimia with min assist Outcome: Progressing Goal: RH STG INCREASE KNOWLEDGE OF STROKE PROPHYLAXIS Description: Patient will be able to name stroke prevention medications with min assist Outcome: Progressing

## 2023-12-26 NOTE — Progress Notes (Signed)
 PROGRESS NOTE   Subjective/Complaints:  Pt doing well again, slept well, denies pain, LBM last night. Urinating fine, no other complaints or concerns.   ROS:  Negative except for HPI Denies CP, SOB, abd pain, N/V/D/C, or any other complaints at this time.    Objective:   No results found. No results for input(s): "WBC", "HGB", "HCT", "PLT" in the last 72 hours.  No results for input(s): "NA", "K", "CL", "CO2", "GLUCOSE", "BUN", "CREATININE", "CALCIUM " in the last 72 hours.   Intake/Output Summary (Last 24 hours) at 12/26/2023 0833 Last data filed at 12/25/2023 1325 Gross per 24 hour  Intake 476 ml  Output 0 ml  Net 476 ml        Physical Exam: Vital Signs Blood pressure (!) 127/99, pulse 79, temperature 98.1 F (36.7 C), resp. rate 18, height 5\' 10"  (1.778 m), weight 92.4 kg, SpO2 99%.     General: awake, alert, resting in bed; NAD HENT: conjugate gaze; oropharynx moist, a little HOH CV: regular rate and rhythm; no JVD Pulmonary: CTA B/L; no W/R/R- good air movement GI: soft, NT, ND, (+)BS Psychiatric: appropriate, calm, cooperative.  Ext: no peripheral edema  PRIOR EXAMS: Neurological: keeps falling asleep decreased memory and tangential- word substitutions noted, some stuttering; mild LUE weakness- 4+/5 but LLE 5/5- some apraxia noted  Mental Status: AAO to May 2025, note date, not city, Hospital, knows its Wednesday, decreased memory, Able to name president, able to provide his birthday. Delayed responses, Poor safety awareness. Distractible and poor attention. Confused.  R gaze preference  Speech/Languate: Naming and repetition intact, fluent, follows simple commands but has more difficulty with multi step or complex commands CRANIAL NERVES: II: PERRL. Visual fields full III, IV, VI: EOM overall intact, requires multiple attempts to complete V: normal sensation bilaterally VII: Mild left facial weakness   VIII: Hard of hearing IX, X: normal palatal elevation XI: 5/5 head turn and 5/5 shoulder shrug bilaterally XII: Tongue midline     MOTOR: RUE: 5/5 Deltoid, 5/5 Biceps, 5/5 Triceps,5/5 Grip LUE: 5/5 Deltoid, 4+/5 Biceps, 4+/5 Triceps, 4+/5 Grip RLE: HF 5/5, KE 5/5, ADF 5/5, APF 5/5 LLE: HF 5/5, KE 5/5, ADF 5/5, APF 5/5   Possible left LE apraxia    REFLEXES: No ankle clonus   SENSORY: Normal to touch all 4 extremities   No hypertonia noted   Assessment/Plan: 1. Functional deficits which require 3+ hours per day of interdisciplinary therapy in a comprehensive inpatient rehab setting. Physiatrist is providing close team supervision and 24 hour management of active medical problems listed below. Physiatrist and rehab team continue to assess barriers to discharge/monitor patient progress toward functional and medical goals  Care Tool:  Bathing    Body parts bathed by patient: Right arm, Left arm, Chest, Abdomen, Front perineal area, Buttocks, Right upper leg, Left upper leg, Right lower leg, Left lower leg, Face         Bathing assist Assist Level: Supervision/Verbal cueing     Upper Body Dressing/Undressing Upper body dressing   What is the patient wearing?: Pull over shirt    Upper body assist Assist Level: Supervision/Verbal cueing    Lower Body Dressing/Undressing Lower body  dressing      What is the patient wearing?: Underwear/pull up, Pants     Lower body assist Assist for lower body dressing: Supervision/Verbal cueing     Toileting Toileting    Toileting assist Assist for toileting: Supervision/Verbal cueing     Transfers Chair/bed transfer  Transfers assist     Chair/bed transfer assist level: Contact Guard/Touching assist     Locomotion Ambulation   Ambulation assist      Assist level: Minimal Assistance - Patient > 75% Assistive device: No Device Max distance: 200   Walk 10 feet activity   Assist     Assist level: Minimal  Assistance - Patient > 75% Assistive device: No Device   Walk 50 feet activity   Assist    Assist level: Minimal Assistance - Patient > 75% Assistive device: No Device    Walk 150 feet activity   Assist    Assist level: Minimal Assistance - Patient > 75% Assistive device: No Device    Walk 10 feet on uneven surface  activity   Assist     Assist level: Minimal Assistance - Patient > 75%     Wheelchair     Assist Is the patient using a wheelchair?: Yes Type of Wheelchair: Manual    Wheelchair assist level: Supervision/Verbal cueing Max wheelchair distance: 150    Wheelchair 50 feet with 2 turns activity    Assist        Assist Level: Supervision/Verbal cueing   Wheelchair 150 feet activity     Assist      Assist Level: Supervision/Verbal cueing   Blood pressure (!) 127/99, pulse 79, temperature 98.1 F (36.7 C), resp. rate 18, height 5\' 10"  (1.778 m), weight 92.4 kg, SpO2 99%.  Medical Problem List and Plan: 1. 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  Antithrombotics: -DVT/anticoagulation:  Pharmaceutical: Lovenox  40mg  daily -antiplatelet therapy:  ASA and Ticagrelor  X 4 weeks followed by ASA alone 3. Pain Management: Tylenol  prn.   5/22- denies pain 4. Mood/Behavior/Sleep: LCSW to follow for evaluation and support.              -antipsychotic agents: N/A             -Continue lexapro  5mg  daily 5. Neuropsych/cognition: This patient may be intermittently capable of making decisions on his own behalf. 6. Skin/Wound Care: Routine pressure relief measures.  7. Fluids/Electrolytes/Nutrition: Monitor I/O. Monitor labs, continue vitamins/supplements  -12/23/23 hypokalemia as below #16, remainder of labs unremarkable 8.  Delirium: Per family has had issues with confusion since yesterday. Low grade fevers are resolving             --Work up 05/20-->CXR- neg. Procal <0.10  WBC 7.1 --Monitor sleep wake disruption (has not slept for more than 24 hours per wife). Tele sitter ordered for fall prevention. Seroquel 25mg  nightly added to help with sleep.  5/22- T 99.3 this AM, but otherwise no temps in 24 hours- WBC is stable- 6.4- rechecking U/A and Cx- could be due to stroke, however wife per chart, felt it came AFTER  stroke- but admitted was having issues at home prior- also per chart. Will get in SLP to tease out what's going on.   5/23- last CXR from 5/20 negative and U/A and UCx 5/22 (-) for UTI 9. HTN: Monitor BP TID--remains labile. Monitor with activity.  --Continue Cozaar  and avoid hypotension due to intracranial atherosclerosis.  5/22-out of permissive HTN timing-  BP running up to 170/99- this AM- losartan  was restarted 25 mg daily- but metoprolol  was not- I don't think Metoprolol  appropriate right now- HR is in 70's off metoprolol - so I think will start Norvasc 2.5 mg daily to help mildly as well- to AVOID HYPOTENSION 5/23- Pt's BP is running 140s-160s but slightly better than yesterday- won't increase meds yet, so doesn't cause hypotension -5/24-25/25 BPs fairly stable/improving, monitor trend given recent med changes Vitals:   12/22/23 1718 12/22/23 1941 12/23/23 0427 12/23/23 1334  BP: (!) 167/96 (!) 150/94 (!) 170/99 (!) 146/99   12/23/23 2015 12/24/23 0609 12/24/23 1317 12/24/23 1940  BP: (!) 163/93 (!) 157/79 126/80 129/79   12/25/23 0428 12/25/23 1443 12/25/23 1934 12/26/23 0710  BP: (!) 141/87 129/86 126/72 (!) 127/99    10. T2DM: Hgb A1C- 6.7. Monitor BS ac/hs and use SSI for elevated BS             --CM restrictions added to diet.   5/22- CBGs running 85-125- will con't to monitor -5/24-25/25 CBGs fine, using very little insulin  in last 24hrs; monitor for now, but may eventually d/c checks given  decent A1C-- consider on Monday CBG (last 3)  Recent Labs    12/25/23 1644 12/25/23 2112 12/26/23 0706  GLUCAP 95 154* 86    11. Hyperlipidemia: LDL @ goal. Continue Lipitor  80 mg 12. Prostate CA: Was on Flomax  0.8 mg till a few months ago.  --Has had hesitancy recently. Hx of retention in the past due to UTI.              --will monitor PVR w/bladder scan. Check UA-- neg 5/23             -Continue flomax , currently 0.4mg  daily -12/25/23 low PVRs, monitor another day but could d/c this as well if continued to improve-- continued to be low 5/25, d/c'd PVRs 13. Class 1 Obesity: Educated on importance of activity and weight loss Body mass index is 29.23 kg/m. 14. H/o gout: Stable on allopurinol  300mg  daily 15. Mood disorder: On Lexapro  5mg  daily.  16. Hypokalemia  5/22- will replete K+ for 3.3 K+ level- 40 mEq x1. Will recheck Monday 17. GI ppx: protonix  40mg  daily    LOS: 4 days A FACE TO FACE EVALUATION WAS PERFORMED  [address] 12/26/2023, 8:33 AM

## 2023-12-26 NOTE — Plan of Care (Signed)
  Problem: Consults Goal: RH STROKE PATIENT EDUCATION Description: See Patient Education module for education specifics  Outcome: Progressing Goal: Nutrition Consult-if indicated Outcome: Progressing   Problem: RH BOWEL ELIMINATION Goal: RH STG MANAGE BOWEL WITH ASSISTANCE Description: STG Manage Bowel with min Assistance. Outcome: Progressing Goal: RH STG MANAGE BOWEL W/MEDICATION W/ASSISTANCE Description: STG Manage Bowel with Medication with min Assistance. Outcome: Progressing   Problem: RH BLADDER ELIMINATION Goal: RH STG MANAGE BLADDER WITH ASSISTANCE Description: STG Manage Bladder With min Assistance Outcome: Progressing   Problem: RH SKIN INTEGRITY Goal: RH STG SKIN FREE OF INFECTION/BREAKDOWN Description: Prevention of skin break down or infection with min assist Outcome: Progressing   Problem: RH SAFETY Goal: RH STG ADHERE TO SAFETY PRECAUTIONS W/ASSISTANCE/DEVICE Description: STG Adhere to Safety Precautions With min Assistance/Device. Outcome: Progressing   Problem: RH PAIN MANAGEMENT Goal: RH STG PAIN MANAGED AT OR BELOW PT'S PAIN GOAL Description: Pain scale <3/10 Outcome: Progressing   Problem: RH KNOWLEDGE DEFICIT Goal: RH STG INCREASE KNOWLEDGE OF HYPERTENSION Description: Patient will be able to verbalize ways to manage hypertension with min assist Outcome: Progressing Goal: RH STG INCREASE KNOWLEGDE OF HYPERLIPIDEMIA Description: Patient will be able to verbalize medication for hyperlipidimia with min assist Outcome: Progressing Goal: RH STG INCREASE KNOWLEDGE OF STROKE PROPHYLAXIS Description: Patient will be able to name stroke prevention medications with min assist Outcome: Progressing

## 2023-12-26 NOTE — Plan of Care (Signed)
  Problem: Consults Goal: RH STROKE PATIENT EDUCATION Description: See Patient Education module for education specifics  Outcome: Progressing Goal: Nutrition Consult-if indicated Outcome: Progressing   Problem: RH BOWEL ELIMINATION Goal: RH STG MANAGE BOWEL WITH ASSISTANCE Description: STG Manage Bowel with min Assistance. Outcome: Progressing Goal: RH STG MANAGE BOWEL W/MEDICATION W/ASSISTANCE Description: STG Manage Bowel with Medication with min Assistance. Outcome: Progressing   Problem: RH BLADDER ELIMINATION Goal: RH STG MANAGE BLADDER WITH ASSISTANCE Description: STG Manage Bladder With min Assistance Outcome: Progressing   Problem: RH SKIN INTEGRITY Goal: RH STG SKIN FREE OF INFECTION/BREAKDOWN Description: Prevention of skin break down or infection with min assist Outcome: Progressing   Problem: RH SAFETY Goal: RH STG ADHERE TO SAFETY PRECAUTIONS W/ASSISTANCE/DEVICE Description: STG Adhere to Safety Precautions With min Assistance/Device. Outcome: Progressing   Problem: RH PAIN MANAGEMENT Goal: RH STG PAIN MANAGED AT OR BELOW PT'S PAIN GOAL Description: Pain scale <3/10 Outcome: Progressing   Problem: RH KNOWLEDGE DEFICIT Goal: RH STG INCREASE KNOWLEDGE OF HYPERTENSION Description: Patient will be able to verbalize ways to manage hypertension with min assist Outcome: Progressing

## 2023-12-27 ENCOUNTER — Other Ambulatory Visit: Payer: Self-pay | Admitting: Cardiology

## 2023-12-27 DIAGNOSIS — I639 Cerebral infarction, unspecified: Secondary | ICD-10-CM | POA: Diagnosis not present

## 2023-12-27 LAB — CBC
HCT: 38.2 % — ABNORMAL LOW (ref 39.0–52.0)
Hemoglobin: 12.8 g/dL — ABNORMAL LOW (ref 13.0–17.0)
MCH: 29.8 pg (ref 26.0–34.0)
MCHC: 33.5 g/dL (ref 30.0–36.0)
MCV: 89 fL (ref 80.0–100.0)
Platelets: 184 10*3/uL (ref 150–400)
RBC: 4.29 MIL/uL (ref 4.22–5.81)
RDW: 13.2 % (ref 11.5–15.5)
WBC: 6.1 10*3/uL (ref 4.0–10.5)
nRBC: 0 % (ref 0.0–0.2)

## 2023-12-27 LAB — BASIC METABOLIC PANEL WITH GFR
Anion gap: 8 (ref 5–15)
BUN: 18 mg/dL (ref 8–23)
CO2: 20 mmol/L — ABNORMAL LOW (ref 22–32)
Calcium: 9 mg/dL (ref 8.9–10.3)
Chloride: 110 mmol/L (ref 98–111)
Creatinine, Ser: 1 mg/dL (ref 0.61–1.24)
GFR, Estimated: >60 mL/min (ref >60)
Glucose, Bld: 106 mg/dL — ABNORMAL HIGH (ref 70–99)
Potassium: 3.5 mmol/L (ref 3.5–5.1)
Sodium: 138 mmol/L (ref 135–145)

## 2023-12-27 LAB — GLUCOSE, CAPILLARY
Glucose-Capillary: 105 mg/dL — ABNORMAL HIGH (ref 70–99)
Glucose-Capillary: 115 mg/dL — ABNORMAL HIGH (ref 70–99)

## 2023-12-27 MED ORDER — INSULIN ASPART 100 UNIT/ML IJ SOLN: 0.0000 [IU] | Freq: Two times a day (BID) | INTRAMUSCULAR | Status: DC

## 2023-12-27 MED ORDER — AMANTADINE HCL 100 MG PO CAPS
100.0000 mg | ORAL_CAPSULE | Freq: Every day | ORAL | Status: DC
  Administered 2023-12-27 – 2023-12-30 (×4): 100 mg via ORAL
  Filled 2023-12-27 (×4): qty 1

## 2023-12-27 NOTE — Progress Notes (Signed)
 Physical Therapy Session Note  Patient Details  Name: Tyler Guerra. MRN: [id number] Date of Birth: 1950/11/06  Today's Date: 12/27/2023 PT Individual Time: 1120-1200 PT Individual Time Calculation (min): 40 min   Short Term Goals: Week 1:  PT Short Term Goal 1 (Week 1): =LTGs d/t ELOS  Skilled Therapeutic Interventions/Progress Updates:    pt received in bed and agreeable to therapy. Pt reports mild low back pain, reports this is chronic and requires no intervention. Session focused on gait with dual tasking and dynamic balance. Pt first counted backwards 100-1 by 3's with CGA-supervision and no AD. Initially no cues or LOB, but increased to min cues and CGA d/t fatigue. Pt then participated in stair and gait activity, retrieving pins from top of stairs and placing across the room. Pt ascended stairs forward and self selected backwards descent with rails, cues for safety at times, but overall CGA. Pt returned to room and to bed, was left with all needs in reach and alarm active.   Therapy Documentation Precautions:  Precautions Precautions: Fall Recall of Precautions/Restrictions: Impaired Precaution/Restrictions Comments: pt is impulsive Restrictions Weight Bearing Restrictions Per Provider Order: No General:       Therapy/Group: Individual Therapy  Tex Filbert 12/27/2023, 12:42 PM

## 2023-12-27 NOTE — Plan of Care (Signed)
  Problem: Consults Goal: RH STROKE PATIENT EDUCATION Description: See Patient Education module for education specifics  Outcome: Progressing   Problem: RH BOWEL ELIMINATION Goal: RH STG MANAGE BOWEL WITH ASSISTANCE Description: STG Manage Bowel with min Assistance. Outcome: Progressing Goal: RH STG MANAGE BOWEL W/MEDICATION W/ASSISTANCE Description: STG Manage Bowel with Medication with min Assistance. Outcome: Progressing   Problem: RH BLADDER ELIMINATION Goal: RH STG MANAGE BLADDER WITH ASSISTANCE Description: STG Manage Bladder With min Assistance Outcome: Progressing   Problem: RH SKIN INTEGRITY Goal: RH STG SKIN FREE OF INFECTION/BREAKDOWN Description: Prevention of skin break down or infection with min assist Outcome: Progressing   Problem: RH SAFETY Goal: RH STG ADHERE TO SAFETY PRECAUTIONS W/ASSISTANCE/DEVICE Description: STG Adhere to Safety Precautions With min Assistance/Device. Outcome: Progressing   Problem: RH PAIN MANAGEMENT Goal: RH STG PAIN MANAGED AT OR BELOW PT'S PAIN GOAL Description: Pain scale <3/10 Outcome: Progressing   Problem: RH KNOWLEDGE DEFICIT Goal: RH STG INCREASE KNOWLEDGE OF HYPERTENSION Description: Patient will be able to verbalize ways to manage hypertension with min assist Outcome: Progressing Goal: RH STG INCREASE KNOWLEGDE OF HYPERLIPIDEMIA Description: Patient will be able to verbalize medication for hyperlipidimia with min assist Outcome: Progressing Goal: RH STG INCREASE KNOWLEDGE OF STROKE PROPHYLAXIS Description: Patient will be able to name stroke prevention medications with min assist Outcome: Progressing

## 2023-12-27 NOTE — Progress Notes (Signed)
 Speech Language Pathology Daily Session Note  Patient Details  Name: Tyler Guerra. MRN: [id number] Date of Birth: 1951/06/24  Today's Date: 12/27/2023 SLP Individual Time: 1004-1103 SLP Individual Time Calculation (min): 59 min  Short Term Goals: Week 1: SLP Short Term Goal 1 (Week 1): STG = LTG due to ELOS  Skilled Therapeutic Interventions:  Patient was seen in am to address cognitive re- training. Pt was easily alerted upon SLP arrival and agreeable to session. Pt expressed PLOF and recent medical hx however noted to be highly disorganized with pt appearing to lack awareness or recollection of information. Pt verbalized awareness of physical deficits and demonstrated intellectual awareness of cognitive deficits. SLP addressing recall of 'BE FAST' stroke symptoms. SLP introduced stroke education binder located in his room along with specific document with BE FAST stroke symptoms after a distracted delay, pt recalled 2 stroke symptoms with sup A. In other minutes of session, SLP addressed problem solving through challenging pt in identify solutions to daily living problems. Pt identified solutions with 83% acc and mod cues. He further identified safety measures within current environment including call button, AD, as well as nursing staff right outside of room. At conclusion of session pt was left upright with call button within reach, bed alarm active, and tele sitter in place. SLP to continue POC.   Pain Pain Assessment Pain Scale: 0-10 Pain Score: 0-No pain  Therapy/Group: Individual Therapy  Adela Holter 12/27/2023, 12:45 PM

## 2023-12-27 NOTE — Progress Notes (Signed)
 Occupational Therapy Session Note  Patient Details  Name: Tyler Guerra. MRN: [id number] Date of Birth: 08/10/1950  Today's Date: 12/27/2023 OT Individual Time: 0800-0900 OT Individual Time Calculation (min): 60 min    Short Term Goals: Week 1:  OT Short Term Goal 1 (Week 1): STG=LTG d/t ELOS  Skilled Therapeutic Interventions/Progress Updates:    Pt resting in bed upon arrival with family present. Skilled OT intervention with focus on dual tasking with ambulation, safety awareness, and activity tolerance. Pt kicked Hoverball in hallways while ambulating and discussing [place] [employer]. When pt focused on conversation, pt with anterior lean requiring min A to correct. When focus on kicking ball, pt had difficulty maintaining focus on conversation. Pt also reported he was fatigued from earlier therapies. Discussed with family possible d/c 5/28 or 5/29. Wife agreeable. Secure chat sent to team. Pt returned to room and reamined in bed with all needs within reach. Bed alarm activated. Telesitter present.   Therapy Documentation Precautions:  Precautions Precautions: Fall Recall of Precautions/Restrictions: Impaired Precaution/Restrictions Comments: pt is impulsive Restrictions Weight Bearing Restrictions Per Provider Order: No Pain:  Pt denies pain this afternoon   Therapy/Group: Individual Therapy  [name] 12/27/2023, 2:45 PM

## 2023-12-27 NOTE — Discharge Instructions (Addendum)
 Inpatient Rehab Discharge Instructions  [facility]. Discharge date and time:  12/30/23  Activities/Precautions/ Functional Status: Activity: no lifting, driving, or strenuous exercise till cleared by MD Diet: cardiac diet and diabetic diet Wound Care: none needed   Functional status:  ___ No restrictions     ___ Walk up steps independently _X__ 24/7 supervision/assistance   ___ Walk up steps with assistance ___ Intermittent supervision/assistance  ___ Bathe/dress independently ___ Walk with walker     ___ Bathe/dress with assistance ___ Walk Independently    ___ Shower independently ___ Walk with assistance    ___ Shower with assistance _X_ No alcohol      ___ Return to work/school ________   Special Instructions:    STROKE/TIA DISCHARGE INSTRUCTIONS SMOKING Cigarette smoking nearly doubles your risk of having a stroke & is the single most alterable risk factor  If you smoke or have smoked in the last 12 months, you are advised to quit smoking for your health. Most of the excess cardiovascular risk related to smoking disappears within a year of stopping. Ask you doctor about anti-smoking medications [place] Quit Line: 1-800-QUIT NOW Free Smoking Cessation Classes [phone]  CHOLESTEROL Know your levels; limit fat & cholesterol in your diet  Lipid Panel     Component Value Date/Time   CHOL 100 12/20/2023 0652   CHOL 126 10/01/2021 0900   TRIG 76 12/20/2023 0652   HDL 34 (L) 12/20/2023 0652   HDL 44 10/01/2021 0900   CHOLHDL 2.9 12/20/2023 0652   VLDL 15 12/20/2023 0652   LDLCALC 51 12/20/2023 0652   LDLCALC 64 10/01/2021 0900     Many patients benefit from treatment even if their cholesterol is at goal. Goal: Total Cholesterol (CHOL) less than 160 Goal:  Triglycerides (TRIG) less than 150 Goal:  HDL greater than 40 Goal:  LDL (LDLCALC) less than 100   BLOOD PRESSURE American Stroke Association blood pressure target is less that 120/80 mm/Hg  Your discharge  blood pressure is:  BP: 113/80 Monitor your blood pressure Limit your salt and alcohol  intake Many individuals will require more than one medication for high blood pressure  DIABETES (A1c is a blood sugar average for last 3 months) Goal HGBA1c is under 7% (HBGA1c is blood sugar average for last 3 months)  Diabetes:     Lab Results  Component Value Date   HGBA1C 6.7 (H) 11/30/2023    Your HGBA1c can be lowered with medications, healthy diet, and exercise. Check your blood sugar as directed by your physician Call your physician if you experience unexplained or low blood sugars.  PHYSICAL ACTIVITY/REHABILITATION Goal is 30 minutes at least 4 days per week  Activity: No driving, Therapies: see above Return to work: N/A Activity decreases your risk of heart attack and stroke and makes your heart stronger.  It helps control your weight and blood pressure; helps you relax and can improve your mood. Participate in a regular exercise program. Talk with your doctor about the best form of exercise for you (dancing, walking, swimming, cycling).  DIET/WEIGHT Goal is to maintain a healthy weight  Your discharge diet is:  Diet Order             Diet heart healthy/carb modified Room service appropriate? Yes; Fluid consistency: Thin  Diet effective now                   liquids Your height is:  Height: 5\' 10"  (177.8 cm) Your current weight is: 203  lbs Your Body Mass Index (BMI) is:  BMI (Calculated): 29.23 Following the type of diet specifically designed for you will help prevent another stroke. Your goal weight is:  174 lbs Your goal Body Mass Index (BMI) is 19-24. Healthy food habits can help reduce 3 risk factors for stroke:  High cholesterol, hypertension, and excess weight.  RESOURCES Stroke/Support Group:  Call [phone]   STROKE EDUCATION PROVIDED/REVIEWED AND GIVEN TO PATIENT Stroke warning signs and symptoms How to activate emergency medical system (call 911). Medications  prescribed at discharge. Need for follow-up after discharge. Personal risk factors for stroke. Pneumonia vaccine given:  Flu vaccine given:  My questions have been answered, the writing is legible, and I understand these instructions.  I will adhere to these goals & educational materials that have been provided to me after my discharge from the hospital.    COMMUNITY REFERRALS UPON DISCHARGE:    Outpatient: PT      OT     ST              Agency:[facility]     [phone]              Appointment Date/Time: *Please expect follow-up within 7-10 business days to schedule your appointment. If you have not received follow-up, be sure to contact the site directly.*      My questions have been answered and I understand these instructions. I will adhere to these goals and the provided educational materials after my discharge from the hospital.  Patient/Caregiver Signature _______________________________ Date __________  Clinician Signature _______________________________________ Date __________  Please bring this form and your medication list with you to all your follow-up doctor's appointments.

## 2023-12-27 NOTE — Progress Notes (Signed)
 PROGRESS NOTE   Subjective/Complaints:  Pt reports going to bathroom to pee- Per OT, needs cues for initiation- max cues.  Also restless and OT has seen some psychomotor agitation.    ROS:  Negative except for HPI  Pt denies SOB, abd pain, CP, N/V/C/D, and vision changes    Objective:   No results found. Recent Labs    12/27/23 0602  WBC 6.1  HGB 12.8*  HCT 38.2*  PLT 184    Recent Labs    12/27/23 0602  NA 138  K 3.5  CL 110  CO2 20*  GLUCOSE 106*  BUN 18  CREATININE 1.00  CALCIUM  9.0     Intake/Output Summary (Last 24 hours) at 12/27/2023 1016 Last data filed at 12/27/2023 0500 Gross per 24 hour  Intake 1210 ml  Output 700 ml  Net 510 ml        Physical Exam: Vital Signs Blood pressure 111/77, pulse 92, temperature 97.9 F (36.6 C), resp. rate 17, height 5\' 10"  (1.778 m), weight 92.4 kg, SpO2 99%.      General: awake, alert, appropriate, but still somewhat confused- social speech intact; walking to bathroom with OT and gait belt; no AD; NAD HENT: conjugate gaze; oropharynx moist CV: regular rate and rhythm- rate in 90's- ; no JVD Pulmonary: CTA B/L; no W/R/R- good air movement GI: soft, NT, ND, (+)BS Psychiatric: appropriate- poor initiation Neurological: poor memory; slightly confused   Ext: no peripheral edema  PRIOR EXAMS: Neurological: keeps falling asleep decreased memory and tangential- word substitutions noted, some stuttering; mild LUE weakness- 4+/5 but LLE 5/5- some apraxia noted  Mental Status: AAO to May 2025, note date, not city, Hospital, knows its Wednesday, decreased memory, Able to name president, able to provide his birthday. Delayed responses, Poor safety awareness. Distractible and poor attention. Confused.  R gaze preference  Speech/Languate: Naming and repetition intact, fluent, follows simple commands but has more difficulty with multi step or complex  commands CRANIAL NERVES: II: PERRL. Visual fields full III, IV, VI: EOM overall intact, requires multiple attempts to complete V: normal sensation bilaterally VII: Mild left facial weakness  VIII: Hard of hearing IX, X: normal palatal elevation XI: 5/5 head turn and 5/5 shoulder shrug bilaterally XII: Tongue midline     MOTOR: RUE: 5/5 Deltoid, 5/5 Biceps, 5/5 Triceps,5/5 Grip LUE: 5/5 Deltoid, 4+/5 Biceps, 4+/5 Triceps, 4+/5 Grip RLE: HF 5/5, KE 5/5, ADF 5/5, APF 5/5 LLE: HF 5/5, KE 5/5, ADF 5/5, APF 5/5   Possible left LE apraxia    REFLEXES: No ankle clonus   SENSORY: Normal to touch all 4 extremities   No hypertonia noted   Assessment/Plan: 1. Functional deficits which require 3+ hours per day of interdisciplinary therapy in a comprehensive inpatient rehab setting. Physiatrist is providing close team supervision and 24 hour management of active medical problems listed below. Physiatrist and rehab team continue to assess barriers to discharge/monitor patient progress toward functional and medical goals  Care Tool:  Bathing    Body parts bathed by patient: Right arm, Left arm, Chest, Abdomen, Front perineal area, Buttocks, Right upper leg, Left upper leg, Right lower leg, Left lower leg, Face  Bathing assist Assist Level: Supervision/Verbal cueing     Upper Body Dressing/Undressing Upper body dressing   What is the patient wearing?: Pull over shirt    Upper body assist Assist Level: Supervision/Verbal cueing    Lower Body Dressing/Undressing Lower body dressing      What is the patient wearing?: Underwear/pull up, Pants     Lower body assist Assist for lower body dressing: Supervision/Verbal cueing     Toileting Toileting    Toileting assist Assist for toileting: Supervision/Verbal cueing     Transfers Chair/bed transfer  Transfers assist     Chair/bed transfer assist level: Contact Guard/Touching assist      Locomotion Ambulation   Ambulation assist      Assist level: Minimal Assistance - Patient > 75% Assistive device: No Device Max distance: 200   Walk 10 feet activity   Assist     Assist level: Minimal Assistance - Patient > 75% Assistive device: No Device   Walk 50 feet activity   Assist    Assist level: Minimal Assistance - Patient > 75% Assistive device: No Device    Walk 150 feet activity   Assist    Assist level: Minimal Assistance - Patient > 75% Assistive device: No Device    Walk 10 feet on uneven surface  activity   Assist     Assist level: Minimal Assistance - Patient > 75%     Wheelchair     Assist Is the patient using a wheelchair?: Yes Type of Wheelchair: Manual    Wheelchair assist level: Supervision/Verbal cueing Max wheelchair distance: 150    Wheelchair 50 feet with 2 turns activity    Assist        Assist Level: Supervision/Verbal cueing   Wheelchair 150 feet activity     Assist      Assist Level: Supervision/Verbal cueing   Blood pressure 111/77, pulse 92, temperature 97.9 F (36.6 C), resp. rate 17, height 5\' 10"  (1.778 m), weight 92.4 kg, SpO2 99%.  Medical Problem List and Plan: 1. 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  Antithrombotics: -DVT/anticoagulation:  Pharmaceutical: Lovenox  40mg  daily -antiplatelet therapy:  ASA and Ticagrelor  X 4 weeks followed by ASA alone 3. Pain Management: Tylenol  prn.   5/22- denies pain 4. Mood/Behavior/Sleep: LCSW to follow for evaluation and support.              -antipsychotic agents: N/A             -Continue lexapro  5mg   daily 5. Neuropsych/cognition: This patient may be intermittently capable of making decisions on his own behalf. 6. Skin/Wound Care: Routine pressure relief measures.  7. Fluids/Electrolytes/Nutrition: Monitor I/O. Monitor labs, continue vitamins/supplements  -12/23/23 hypokalemia as below #16, remainder of labs unremarkable 8. Delirium: Per family has had issues with confusion since yesterday. Low grade fevers are resolving             --Work up 05/20-->CXR- neg. Procal <0.10  WBC 7.1 --Monitor sleep  wake disruption (has not slept for more than 24 hours per wife). Tele sitter ordered for fall prevention. Seroquel 25mg  nightly added to help with sleep.  5/22- T 99.3 this AM, but otherwise no temps in 24 hours- WBC is stable- 6.4- rechecking U/A and Cx- could be due to stroke, however wife per chart, felt it came AFTER stroke- but admitted was having issues at home prior- also per chart. Will get in SLP to tease out what's going on.   5/23- last CXR from 5/20 negative and U/A and UCx 5/22 (-) for UTI  5/26- WBC 6.1 9. HTN: Monitor BP TID--remains labile. Monitor with activity.  --Continue Cozaar  and avoid hypotension due to intracranial atherosclerosis.  5/22-out of permissive HTN timing-  BP running up to 170/99- this AM- losartan  was restarted 25 mg daily- but metoprolol  was not- I don't think Metoprolol  appropriate right now- HR is in 70's off metoprolol - so I think will start Norvasc 2.5 mg daily to help mildly as well- to AVOID HYPOTENSION 5/23- Pt's BP is running 140s-160s but slightly better than yesterday- won't increase meds yet, so doesn't cause hypotension -5/24-25/25 BPs fairly stable/improving, monitor trend given recent med changes 5/26- BP too low this AM- will stop Norvasc 2.5 mg since BP on low side- to avoid hypotension.  Vitals:   12/23/23 2015 12/24/23 0609 12/24/23 1317 12/24/23 1940  BP: (!) 163/93 (!) 157/79 126/80 129/79   12/25/23 0428 12/25/23 1443 12/25/23 1934 12/26/23  0710  BP: (!) 141/87 129/86 126/72 (!) 127/99   12/26/23 1238 12/26/23 2046 12/27/23 0532 12/27/23 0928  BP: 120/81 (!) 141/82 107/74 111/77    10. T2DM: Hgb A1C- 6.7. Monitor BS ac/hs and use SSI for elevated BS             --CM restrictions added to diet.   5/22- CBGs running 85-125- will con't to monitor -5/24-25/25 CBGs fine, using very little insulin  in last 24hrs; monitor for now, but may eventually d/c checks given decent A1C-- consider on Monday 5/26- Will decrease CBG checks to BID- AC CBG (last 3)  Recent Labs    12/26/23 1704 12/26/23 2128 12/27/23 0622  GLUCAP 109* 92 105*    11. Hyperlipidemia: LDL @ goal. Continue Lipitor  80 mg 12. Prostate CA: Was on Flomax  0.8 mg till a few months ago.  --Has had hesitancy recently. Hx of retention in the past due to UTI.              --will monitor PVR w/bladder scan. Check UA-- neg 5/23             -Continue flomax , currently 0.4mg  daily -12/25/23 low PVRs, monitor another day but could d/c this as well if continued to improve-- continued to be low 5/25, d/c'd PVRs 5/26- d/c bladder scans 13. Class 1 Obesity: Educated on importance of activity and weight loss Body mass index is 29.23 kg/m. 14. H/o gout: Stable on allopurinol  300mg  daily 15. Mood disorder: On Lexapro  5mg  daily.  16. Hypokalemia  5/22- will replete K+ for 3.3 K+ level- 40 mEq x1. Will recheck Monday  5/26- K+ 3.5- low but in normal range- will recheck Thursday 17. GI ppx: protonix  40mg  daily   I spent a total of 39   minutes on total care today- >50% coordination of care- due to  D/w OT as well as nursing about pt- reviewed labs and vitals- made BP meds changes and added Amantadine   LOS: 5 days A FACE TO FACE EVALUATION  WAS PERFORMED  Huntley Demedeiros 12/27/2023, 10:16 AM

## 2023-12-27 NOTE — Progress Notes (Signed)
 Occupational Therapy Session Note  Patient Details  Name: Tyler Guerra. MRN: [id number] Date of Birth: 10-27-1950  Today's Date: 12/27/2023 OT Individual Time: 0800-0900 OT Individual Time Calculation (min): 60 min    Short Term Goals: Week 1:  OT Short Term Goal 1 (Week 1): STG=LTG d/t ELOS  Skilled Therapeutic Interventions/Progress Updates:      Therapy Documentation Precautions:  Precautions Precautions: Fall Recall of Precautions/Restrictions: Impaired Precaution/Restrictions Comments: pt is impulsive Restrictions Weight Bearing Restrictions Per Provider Order: No General: "I was fast asleep" Pt supine in bed upon OT arrival, agreeable to OT session.  Pain: no pain reported  ADL: OT providing skilled intervention on ADL retraining in order to increase independence with tasks and increase activity tolerance. Pt completed the following tasks at the current level of assist: Bed mobility: SBA from flat bed, supine>EOB Eating: set up, seated EOB to eat ~50% of breakfast Grooming/oral hygiene: Toilet transfer: CGA without AD ambulating from bed>standard toilet Toileting: CGA, standing to void urine, pt also wanting to sit to have BM before bathing, able to stand and doff LB clothing and retrieve clothing from floor at CGA using grab bar for stability UB dressing: CGA standing to doff overhead shirt standing unsupported without LOB LB dressing: see toileting tab Footwear: CGA in standing to manage doffing socks, sitting EOB to don in figure 4 Shower transfer/bathing: SBA, standing for bathing with intermittent use of grab bar for support, able to bend and reach lower legs with support of grab bar without LOB  Other Treatments: OT instructing pt to participate in light housekeeping, pt able to reach to floor to gather towels from shower and place them into dirty linen bag with CGA no AD. OT leading pt in orienting to day of week which pt was able to correctly answer.    Pt  supine in bed with bed alarm activated, 2 bed rails up, call light within reach and 4Ps assessed.   Therapy/Group: Individual Therapy  Nila Barth, OTD, OTR/L 12/27/2023, 12:47 PM

## 2023-12-28 DIAGNOSIS — I639 Cerebral infarction, unspecified: Secondary | ICD-10-CM | POA: Diagnosis not present

## 2023-12-28 LAB — GLUCOSE, CAPILLARY
Glucose-Capillary: 106 mg/dL — ABNORMAL HIGH (ref 70–99)
Glucose-Capillary: 107 mg/dL — ABNORMAL HIGH (ref 70–99)

## 2023-12-28 NOTE — Progress Notes (Signed)
 Speech Language Pathology Daily Session Note  Patient Details  Name: Tyler Guerra. MRN: [id number] Date of Birth: 01-20-1951  Today's Date: 12/28/2023 SLP Individual Time: 0850-0930 SLP Individual Time Calculation (min): 40 min  Short Term Goals: Week 1: SLP Short Term Goal 1 (Week 1): STG = LTG due to ELOS  Skilled Therapeutic Interventions:  Patient was seen in am to address cognitive re- training. Pt was alert and seen at bedside. He denied pain and was agreeable for session. Pt indep oriented to year, situation, and place. He was not oriented to month or day of week. Given min A for use of external aid, pt identified temporal concepts. SLP challenged pt in interpretation of therapy schedule with pt warranting mod A. SLP further addressed recall of daily events with pt recalling breakfast meal and participation in OT session earlier that am. When challenged pt also able to recall 2 symptoms of stroke given training in 'BE FAST' stroke symptoms on previous day. In remaining minutes of session SLP addressed attention through engaging pt in guided conversation. Pt able to attend throughout conversation though speech was mildly tangential with sup to min A. At conclusion of session, pt was left upright in bed with bed alarm active and call button within reach. SLP to continue POC.   Pain Pain Assessment Pain Scale: 0-10 Pain Score: 0-No pain  Therapy/Group: Individual Therapy  Adela Holter 12/28/2023, 4:03 PM

## 2023-12-28 NOTE — Plan of Care (Signed)
  Problem: Consults Goal: RH STROKE PATIENT EDUCATION Description: See Patient Education module for education specifics  Outcome: Progressing Goal: Nutrition Consult-if indicated Outcome: Progressing   Problem: RH BOWEL ELIMINATION Goal: RH STG MANAGE BOWEL WITH ASSISTANCE Description: STG Manage Bowel with min Assistance. Outcome: Progressing Goal: RH STG MANAGE BOWEL W/MEDICATION W/ASSISTANCE Description: STG Manage Bowel with Medication with min Assistance. Outcome: Progressing   Problem: RH BLADDER ELIMINATION Goal: RH STG MANAGE BLADDER WITH ASSISTANCE Description: STG Manage Bladder With min Assistance Outcome: Progressing   Problem: RH SKIN INTEGRITY Goal: RH STG SKIN FREE OF INFECTION/BREAKDOWN Description: Prevention of skin break down or infection with min assist Outcome: Progressing   Problem: RH SAFETY Goal: RH STG ADHERE TO SAFETY PRECAUTIONS W/ASSISTANCE/DEVICE Description: STG Adhere to Safety Precautions With min Assistance/Device. Outcome: Progressing   Problem: RH PAIN MANAGEMENT Goal: RH STG PAIN MANAGED AT OR BELOW PT'S PAIN GOAL Description: Pain scale <3/10 Outcome: Progressing   Problem: RH KNOWLEDGE DEFICIT Goal: RH STG INCREASE KNOWLEDGE OF HYPERTENSION Description: Patient will be able to verbalize ways to manage hypertension with min assist Outcome: Progressing Goal: RH STG INCREASE KNOWLEGDE OF HYPERLIPIDEMIA Description: Patient will be able to verbalize medication for hyperlipidimia with min assist Outcome: Progressing Goal: RH STG INCREASE KNOWLEDGE OF STROKE PROPHYLAXIS Description: Patient will be able to name stroke prevention medications with min assist Outcome: Progressing

## 2023-12-28 NOTE — Progress Notes (Signed)
 Occupational Therapy Discharge Summary  Patient Details  Name: Tyler Guerra. MRN: [id number] Date of Birth: 1951-03-24  Date of Discharge from OT service:{Time; dates multiple:304500300}  Patient has met 8 of 8 long term goals due to {due MV:7846962}.  Pt made steady progress with BADLs and functional transfes during this admission. Supervision for all BADLs-standing in shower and use of toilet without BSC. All transfers with supervision without AD. Pt requires min verbal cues for task initiation and will require 24 hour supervision at discharge. Pt's wife has been present during poritons of therapy sessions and has verbalized comfortable with providing supervision.Patient to discharge at overall {LOA:3049010} level.  Patient's care partner {care partner:3041650} to provide the necessary {assistance:3041652} assistance at discharge.    Reasons goals not met: n/a  Recommendation:  Patient will benefit from ongoing skilled OT services in {setting:3041680} to continue to advance functional skills in the area of {ADL/iADL:3041649}.  Equipment: No equipment provided  Reasons for discharge: {Reason for discharge:3049018}  Patient/family agrees with progress made and goals achieved: {Pt/Family agree with progress/goals:3049020}  OT Discharge ADL ADL Eating: Independent Grooming: Supervision/safety Where Assessed-Grooming: Standing at sink Upper Body Bathing: Supervision/safety Where Assessed-Upper Body Bathing: Shower Lower Body Bathing: Supervision/safety Where Assessed-Lower Body Bathing: Shower Upper Body Dressing: Independent Where Assessed-Upper Body Dressing: Chair Lower Body Dressing: Supervision/safety Where Assessed-Lower Body Dressing: Chair, Standing at sink Toileting: Supervision/safety Where Assessed-Toileting: Teacher, adult education: Close supervision Toilet Transfer Method: Therapist, occupational: Walk in shower, Grab  bars, Insurance underwriter: Close supervision Film/video editor Method: Designer, industrial/product: Grab bars ADL Comments: Pt required VC for safety during ADL tasks d/t impulsivity Vision Baseline Vision/History: 1 Wears glasses Patient Visual Report: Eye fatigue/eye pain/headache Vision Assessment?: Yes Ocular Range of Motion: Restricted on the left;Impaired-to be further tested in functional context Alignment/Gaze Preference: Gaze right Tracking/Visual Pursuits: Decreased smoothness of horizontal tracking;Requires cues, head turns, or add eye shifts to track;Impaired - to be further tested in functional context Visual Fields: Left homonymous hemianopsia;Impaired-to be further tested in functional context Perception  Perception: Within Functional Limits Praxis Praxis: WFL Cognition Cognition Overall Cognitive Status: Impaired/Different from baseline Arousal/Alertness: Awake/alert Orientation Level: Person;Place;Situation Memory Impairment: Decreased recall of new information Decreased Short Term Memory: Verbal basic;Functional basic Attention: Sustained Sustained Attention: Appears intact Awareness: Impaired Awareness Impairment: Emergent impairment Problem Solving: Impaired Comments: impulsive Brief Interview for Mental Status (BIMS) Repetition of Three Words (First Attempt): 3 Temporal Orientation: Year: Correct Temporal Orientation: Month: Accurate within 5 days Temporal Orientation: Day: Incorrect Recall: "Sock": Yes, no cue required Recall: "Blue": Yes, no cue required Recall: "Bed": Yes, no cue required BIMS Summary Score: 14 Sensation Sensation Light Touch: Appears Intact Hot/Cold: Appears Intact Proprioception: Appears Intact Stereognosis: Not tested Coordination Gross Motor Movements are Fluid and Coordinated: Yes Fine Motor Movements are Fluid and Coordinated: Yes Motor  Motor Motor: Other (comment);Motor  perseverations Motor - Skilled Clinical Observations: generalized weakness, some residual weakness in Lt hand from prior stroke Mobility     Trunk/Postural Assessment  Cervical Assessment Cervical Assessment: Within Functional Limits Thoracic Assessment Thoracic Assessment: Within Functional Limits Lumbar Assessment Lumbar Assessment: Within Functional Limits Postural Control Postural Control: Deficits on evaluation  Balance Static Sitting Balance Static Sitting - Balance Support: Feet supported Static Sitting - Level of Assistance: 7: Independent Dynamic Sitting Balance Dynamic Sitting - Balance Support: During functional activity Dynamic Sitting - Level of Assistance: 5: Stand by assistance Extremity/Trunk Assessment RUE Assessment RUE  Assessment: Within Functional Limits LUE Assessment LUE Assessment: Within Functional Limits   [name] 12/28/2023, 12:14 PM

## 2023-12-28 NOTE — Progress Notes (Signed)
 PROGRESS NOTE   Subjective/Complaints:  Pt reports he feels like cognition is getting better.  LBM yesterday He said he  "Needs to play the cards he was dealt" due to the stroke  ROS:  Negative except for HPI   Pt denies SOB, abd pain, CP, N/V/C/D, and vision changes   Objective:   No results found. Recent Labs    12/27/23 0602  WBC 6.1  HGB 12.8*  HCT 38.2*  PLT 184    Recent Labs    12/27/23 0602  NA 138  K 3.5  CL 110  CO2 20*  GLUCOSE 106*  BUN 18  CREATININE 1.00  CALCIUM  9.0     Intake/Output Summary (Last 24 hours) at 12/28/2023 1037 Last data filed at 12/28/2023 0600 Gross per 24 hour  Intake 1216 ml  Output --  Net 1216 ml        Physical Exam: Vital Signs Blood pressure 139/80, pulse 74, temperature 97.7 F (36.5 C), resp. rate 18, height 5\' 10"  (1.778 m), weight 92.4 kg, SpO2 100%.       General: awake, alert, appropriate, sitting on nustep- with OT; NAD HENT: conjugate gaze; oropharynx moist CV: regular rate and rhythm; no JVD Pulmonary: CTA B/L; no W/R/R- good air movement GI: soft, NT, ND, (+)BS- brighter affect Neurological: remembered me and my role today- doing slightly better cognitively   Ext: no peripheral edema  PRIOR EXAMS: Neurological: keeps falling asleep decreased memory and tangential- word substitutions noted, some stuttering; mild LUE weakness- 4+/5 but LLE 5/5- some apraxia noted  Mental Status: AAO to May 2025, note date, not city, Hospital, knows its Wednesday, decreased memory, Able to name president, able to provide his birthday. Delayed responses, Poor safety awareness. Distractible and poor attention. Confused.  R gaze preference  Speech/Languate: Naming and repetition intact, fluent, follows simple commands but has more difficulty with multi step or complex commands CRANIAL NERVES: II: PERRL. Visual fields full III, IV, VI: EOM overall intact,  requires multiple attempts to complete V: normal sensation bilaterally VII: Mild left facial weakness  VIII: Hard of hearing IX, X: normal palatal elevation XI: 5/5 head turn and 5/5 shoulder shrug bilaterally XII: Tongue midline     MOTOR: RUE: 5/5 Deltoid, 5/5 Biceps, 5/5 Triceps,5/5 Grip LUE: 5/5 Deltoid, 4+/5 Biceps, 4+/5 Triceps, 4+/5 Grip RLE: HF 5/5, KE 5/5, ADF 5/5, APF 5/5 LLE: HF 5/5, KE 5/5, ADF 5/5, APF 5/5   Possible left LE apraxia    REFLEXES: No ankle clonus   SENSORY: Normal to touch all 4 extremities   No hypertonia noted   Assessment/Plan: 1. Functional deficits which require 3+ hours per day of interdisciplinary therapy in a comprehensive inpatient rehab setting. Physiatrist is providing close team supervision and 24 hour management of active medical problems listed below. Physiatrist and rehab team continue to assess barriers to discharge/monitor patient progress toward functional and medical goals  Care Tool:  Bathing    Body parts bathed by patient: Right arm, Left arm, Chest, Abdomen, Front perineal area, Buttocks, Right upper leg, Left upper leg, Right lower leg, Left lower leg, Face         Bathing assist Assist  Level: Supervision/Verbal cueing     Upper Body Dressing/Undressing Upper body dressing   What is the patient wearing?: Pull over shirt    Upper body assist Assist Level: Independent    Lower Body Dressing/Undressing Lower body dressing      What is the patient wearing?: Underwear/pull up, Pants     Lower body assist Assist for lower body dressing: Supervision/Verbal cueing     Toileting Toileting    Toileting assist Assist for toileting: Supervision/Verbal cueing     Transfers Chair/bed transfer  Transfers assist     Chair/bed transfer assist level: Contact Guard/Touching assist     Locomotion Ambulation   Ambulation assist      Assist level: Minimal Assistance - Patient > 75% Assistive device: No  Device Max distance: 200   Walk 10 feet activity   Assist     Assist level: Minimal Assistance - Patient > 75% Assistive device: No Device   Walk 50 feet activity   Assist    Assist level: Minimal Assistance - Patient > 75% Assistive device: No Device    Walk 150 feet activity   Assist    Assist level: Minimal Assistance - Patient > 75% Assistive device: No Device    Walk 10 feet on uneven surface  activity   Assist     Assist level: Minimal Assistance - Patient > 75%     Wheelchair     Assist Is the patient using a wheelchair?: Yes Type of Wheelchair: Manual    Wheelchair assist level: Supervision/Verbal cueing Max wheelchair distance: 150    Wheelchair 50 feet with 2 turns activity    Assist        Assist Level: Supervision/Verbal cueing   Wheelchair 150 feet activity     Assist      Assist Level: Supervision/Verbal cueing   Blood pressure 139/80, pulse 74, temperature 97.7 F (36.5 C), resp. rate 18, height 5\' 10"  (1.778 m), weight 92.4 kg, SpO2 100%.  Medical Problem List and Plan: 1. 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   Walking well with gait belt, but cognition still very affected- poor initiation per therapy- will add Amantadine 100 mg daily- monitor BP  -improved initiation today- Amantadine might be dropping BP slightly- will monitor 2.  Antithrombotics: -DVT/anticoagulation:  Pharmaceutical: Lovenox  40mg  daily -antiplatelet therapy:  ASA and Ticagrelor  X 4 weeks followed by ASA alone 3. Pain Management: Tylenol  prn.   5/22- denies pain 4. Mood/Behavior/Sleep: LCSW to follow for evaluation and support.              -antipsychotic  agents: N/A             -Continue lexapro  5mg  daily 5. Neuropsych/cognition: This patient may be intermittently capable of making decisions on his own behalf. 6. Skin/Wound Care: Routine pressure relief measures.  7. Fluids/Electrolytes/Nutrition: Monitor I/O. Monitor labs, continue vitamins/supplements  -12/23/23 hypokalemia as below #16, remainder of labs unremarkable 8. Delirium: Per family has had issues with confusion since yesterday. Low grade fevers are  resolving             --Work up 05/20-->CXR- neg. Procal <0.10  WBC 7.1 --Monitor sleep wake disruption (has not slept for more than 24 hours per wife). Tele sitter ordered for fall prevention. Seroquel 25mg  nightly added to help with sleep.  5/22- T 99.3 this AM, but otherwise no temps in 24 hours- WBC is stable- 6.4- rechecking U/A and Cx- could be due to stroke, however wife per chart, felt it came AFTER stroke- but admitted was having issues at home prior- also per chart. Will get in SLP to tease out what's going on.   5/23- last CXR from 5/20 negative and U/A and UCx 5/22 (-) for UTI  5/26- WBC 6.1 9. HTN: Monitor BP TID--remains labile. Monitor with activity.  --Continue Cozaar  and avoid hypotension due to intracranial atherosclerosis.  5/22-out of permissive HTN timing-  BP running up to 170/99- this AM- losartan  was restarted 25 mg daily- but metoprolol  was not- I don't think Metoprolol  appropriate right now- HR is in 70's off metoprolol - so I think will start Norvasc 2.5 mg daily to help mildly as well- to AVOID HYPOTENSION 5/23- Pt's BP is running 140s-160s but slightly better than yesterday- won't increase meds yet, so doesn't cause hypotension -5/24-25/25 BPs fairly stable/improving, monitor trend given recent med changes 5/26- BP too low this AM- will stop Norvasc 2.5 mg since BP on low side- to avoid hypotension.  5/27- BP running 110s systolic- will monitor and see if needs to reduce BP meds? Already stopped Norvasc Vitals:    12/24/23 1940 12/25/23 0428 12/25/23 1443 12/25/23 1934  BP: 129/79 (!) 141/87 129/86 126/72   12/26/23 0710 12/26/23 1238 12/26/23 2046 12/27/23 0532  BP: (!) 127/99 120/81 (!) 141/82 107/74   12/27/23 0928 12/27/23 1438 12/27/23 1949 12/28/23 0421  BP: 111/77 113/80 122/82 139/80    10. T2DM: Hgb A1C- 6.7. Monitor BS ac/hs and use SSI for elevated BS             --CM restrictions added to diet.   5/22- CBGs running 85-125- will con't to monitor -5/24-25/25 CBGs fine, using very little insulin  in last 24hrs; monitor for now, but may eventually d/c checks given decent A1C-- consider on Monday 5/26- Will decrease CBG checks to BID- AC CBG (last 3)  Recent Labs    12/27/23 0622 12/27/23 1648 12/28/23 0607  GLUCAP 105* 115* 106*    11. Hyperlipidemia: LDL @ goal. Continue Lipitor  80 mg 12. Prostate CA: Was on Flomax  0.8 mg till a few months ago.  --Has had hesitancy recently. Hx of retention in the past due to UTI.              --will monitor PVR w/bladder scan. Check UA-- neg 5/23             -Continue flomax , currently 0.4mg  daily -12/25/23 low PVRs, monitor another day but could d/c this as well if continued to improve-- continued to be low 5/25, d/c'd PVRs 5/26- d/c bladder scans 13. Class 1 Obesity: Educated on importance of activity and weight loss Body mass index is 29.23 kg/m. 14. H/o gout: Stable on allopurinol  300mg  daily 15. Mood disorder: On Lexapro  5mg  daily.  16. Hypokalemia  5/22- will replete K+ for 3.3 K+ level- 40 mEq x1. Will recheck Monday  5/26- K+ 3.5- low but in normal range- will recheck Thursday 17. GI ppx: protonix  40mg  daily   I spent a total of 39   minutes  on total care today- >50% coordination of care- due to  D/w pt as well as daughter and OT_ also team conference to determine d/c date and finalize d/c.    LOS: 6 days A FACE TO FACE EVALUATION WAS PERFORMED  Mary Hockey 12/28/2023, 10:37 AM

## 2023-12-28 NOTE — Progress Notes (Signed)
 Occupational Therapy Session Note  Patient Details  Name: Tyler Guerra. MRN: [id number] Date of Birth: Jul 05, 1951  Today's Date: 12/28/2023 OT Individual Time: 4098-1191 OT Individual Time Calculation (min): 75 min    Short Term Goals: Week 1:  OT Short Term Goal 1 (Week 1): STG=LTG d/t ELOS  Skilled Therapeutic Interventions/Progress Updates:    Pt eating breakfast in bed upon arrival. Skilled OT intervention with focus on BADLs, standing balance, functional amb without AD, therex for general conditioning, and safety awareness to increase independence with BADLS. Pt completed bathing standing in shower using grab bars as approriate. Dressing completed with sit<>stand. Grooming standing at sink. All tasks completed at supervision level with occasional verbal cues for initation during sequence transitions. Pt amb to day room. 10 mins Nustep level 4 75 SPM. Pt returned to room and returned to bed. Daughter present. Bed alarm activated. All needs within reach. Pt able to carry on conversation during ambulation with no LOB.   Therapy Documentation Precautions:  Precautions Precautions: Fall Recall of Precautions/Restrictions: Impaired Precaution/Restrictions Comments: pt is impulsive Restrictions Weight Bearing Restrictions Per Provider Order: No   Pain: Pt denies pain this morning.  Therapy/Group: Individual Therapy  [name] 12/28/2023, 10:00 AM

## 2023-12-28 NOTE — Progress Notes (Signed)
 Patient ID: [name], male   DOB: [birth year], 73 y.o.   MRN: [id number]   1220- SW left message for pt wife Trevor Fudge to provide updates from team conference, d/c date 5/29, and outpatient PT/OT/SLP has been recommended. SW waiting on follow-up.   SW met with pt in room to provide updates from team conference. Will discuss with his wife preferred outpatient location.   1420-SW returned phone call to pt wife to discuss outpatient preference- [facility]. SW faxed order.   Norval Been, MSW, LCSW Office: [phone] Cell: [phone] Fax: [phone]

## 2023-12-28 NOTE — Patient Care Conference (Signed)
 Inpatient RehabilitationTeam Conference and Plan of Care Update Date: 12/28/2023   Time: 1115 am    Patient Name: Tyler Guerra.      Medical Record Number: [id number]  Date of Birth: September 05, 1950 Sex: Male         Room/Bed: 4W14C/4W14C-01 Payor Info: Payor: MEDICARE / Plan: MEDICARE PART A AND B / Product Type: *No Product type* /    Admit Date/Time:  12/22/2023  4:06 PM  Primary Diagnosis:  Stroke, small vessel [facility])  Hospital Problems: Principal Problem:   Stroke, small vessel [facility]    Expected Discharge Date: Expected Discharge Date: 12/30/23  Team Members Present: Physician leading conference: Dr. Celia Coles Social Worker Present: Norval Been, LCSW Nurse Present: Jerene Monks, RN PT Present: Aundria Leech, PT OT Present: Henrene Locust, OT;Jackqueline Mason, COTA SLP Present: Jenney Modest, SLP PPS Coordinator present : Jestine Moron, SLP     Current Status/Progress Goal Weekly Team Focus  Bowel/Bladder      Continent of bowel and bladder    Maintain continence of bowel and bladder    Assess bowel and bladder q shift  Swallow/Nutrition/ Hydration               ADL's   BADLs-supervision; min verbal cue for safety awareness, ovvasional LOB with dual tasking and/or in noisy/busy envirionment   supervision overall   safety awareness, balance, task initiation, education    Mobility   CGA to supervision for mobility. Very rare LOB with dual tasking/distraction   supervision  dual tasking and high level balance    Communication                Safety/Cognition/ Behavioral Observations  mod A for orientation, problem solving, recall, attention, awareness   mod A   implement compensatory strategies, orientation, attention, awareness, functional problem solving.    Pain      No c/o pain    <4 w/ prns    Assess pain q shift  Skin      No skin issues   Maintain skin free of infection    Assess pain q shift    Discharge Planning:  D/C home  with wife, dtr next door, other dtr close by and grandson will be available to assist as needed. 2 story main living area since stroke 12/2022.    Team Discussion: Patient admitted post CVA R parietal periventricular white matter. Patient with poor initiation, hypertension: medication adjusted by MD. Patient limited by cognition deficits and occasional loss of balance with dual tasking.  Patient on target to meet rehab goals: yes, currently requires supervision with ADLs and mobility. Patient requires mod assistance for orientation , attention and awareness. Overall goals set at discharge are set for supervision  *See Care Plan and progress notes for long and short-term goals.   Revisions to Treatment Plan:  N/a   Teaching Needs: Safety, medications, transfers, toileting , etc.   Current Barriers to Discharge: Decreased caregiver support and Home enviroment access/layout  Possible Resolutions to Barriers:      Medical Summary Current Status: no pain- d/c his telesitter since oding better- needs some cues tio initiate  Barriers to Discharge: Behavior/Mood;Other (comments);Electrolyte abnormality;Hypotension  Barriers to Discharge Comments: impaired cognition- cannot do mulkitple tasks at once- loss of balance- mod A for orientation,attention, problem solving; memory Possible Resolutions to [employer] Focus: started amantadine for poor initiation-d/c THursday 5/29   Continued Need for Acute Rehabilitation Level of Care: The patient requires daily medical management by a physician  with specialized training in physical medicine and rehabilitation for the following reasons: Direction of a multidisciplinary physical rehabilitation program to maximize functional independence : Yes Medical management of patient stability for increased activity during participation in an intensive rehabilitation regime.: Yes Analysis of laboratory values and/or radiology reports with any subsequent need  for medication adjustment and/or medical intervention. : Yes   I attest that I was present, lead the team conference, and concur with the assessment and plan of the team.   Eloyse Causey Gayo 12/28/2023, 1115 am

## 2023-12-28 NOTE — Progress Notes (Addendum)
 Physical Therapy Session Note  Patient Details  Name: Tyler Guerra. MRN: [id number] Date of Birth: 1951/02/06  Today's Date: 12/28/2023 PT Individual Time: 1330-1445 PT Individual Time Calculation (min): 75 min   Short Term Goals: Week 1:  PT Short Term Goal 1 (Week 1): =LTGs d/t ELOS  Skilled Therapeutic Interventions/Progress Updates:    pt received in bed and agreeable to therapy. No complaint of pain.   Session focused on fall recovery and floor transfers, with emphasis on dynamic balance with cognitive tasks. Discussed fall prevention and safety, including when to activate EMS. Pt performed floor transfer with CGA, fading to close supervision. Pt then performed block practice half kneeling<>stand while retrieving foam legos from floor and then copying picture with blocks in standing. CGA throughout. Occasional min cues fading to supervision for cog task. Pt then stood on cobble foam on top of compliant floor mat while cleaning blocks and placing in box. Pt then navigated 6" stairs x 12, no handrails while ascending and BIL handrails while descending for safety. Required as much as min a for balance while descending. Pt expressed concerned about tandem stance since performing berg balance earlier in his stay. Performed tandem walking 2 x 30 ft, min a to near CGA with repetition. Pt returned to room and to bed after session, was left with all needs in reach and alarm active.   Therapy Documentation Precautions:  Precautions Precautions: Fall Recall of Precautions/Restrictions: Impaired Precaution/Restrictions Comments: pt is impulsive Restrictions Weight Bearing Restrictions Per Provider Order: No General:      Therapy/Group: Individual Therapy  Tex Filbert 12/28/2023, 4:02 PM

## 2023-12-28 NOTE — Progress Notes (Signed)
 Physical Therapy Discharge Summary  Patient Details  Name: Tyler Guerra. MRN: [id number] Date of Birth: Feb 01, 1951  Date of Discharge from PT service:{Time; dates multiple:304500300}  {CHL IP REHAB PT TIME CALCULATION:304800500}   Patient has met {NUMBERS 0-12:18577} of 7 long term goals due to improved activity tolerance, improved balance, improved postural control, increased strength, and ability to compensate for deficits.  Patient to discharge at an ambulatory level Supervision.   Patient's care partner is independent to provide the necessary cognitive assistance at discharge.  Reasons goals not met: ***  Recommendation:  Patient will benefit from ongoing skilled PT services in outpatient setting to continue to advance safe functional mobility, address ongoing impairments in balance, cognition, dual tasking, and minimize fall risk.  Equipment: No equipment provided  Reasons for discharge: treatment goals met and discharge from hospital  Patient/family agrees with progress made and goals achieved: Yes  PT Discharge Precautions/Restrictions Precautions Precautions: Fall Recall of Precautions/Restrictions: Impaired Precaution/Restrictions Comments: pt is impulsive Restrictions Weight Bearing Restrictions Per Provider Order: No Vital Signs Therapy Vitals Temp: 97.8 F (36.6 C) Temp Source: Oral Pulse Rate: 79 Resp: 18 BP: 122/81 Patient Position (if appropriate): Lying Oxygen Therapy SpO2: 100 % O2 Device: Room Air Pain Pain Assessment Pain Scale: 0-10 Pain Score: 0-No pain Pain Interference Pain Interference Pain Effect on Sleep: 1. Rarely or not at all Pain Interference with Therapy Activities: 1. Rarely or not at all Pain Interference with Day-to-Day Activities: 1. Rarely or not at all Vision/Perception  Vision - History Ability to See in Adequate Light: 0 Adequate Vision - Assessment Eye Alignment: Within Functional Limits Ocular Range of Motion:  Restricted on the left;Impaired-to be further tested in functional context Alignment/Gaze Preference: Gaze right Tracking/Visual Pursuits: Decreased smoothness of horizontal tracking;Requires cues, head turns, or add eye shifts to track;Impaired - to be further tested in functional context Perception Perception: Within Functional Limits Praxis Praxis: WFL  Cognition Overall Cognitive Status: Impaired/Different from baseline Arousal/Alertness: Awake/alert Orientation Level: Oriented X4 Attention: Sustained Sustained Attention: Appears intact Memory: Impaired Memory Impairment: Decreased recall of new information Decreased Short Term Memory: Verbal basic;Functional basic Awareness: Impaired Awareness Impairment: Emergent impairment Problem Solving: Impaired Safety/Judgment: Impaired Comments: impulsive Sensation Sensation Light Touch: Appears Intact Hot/Cold: Appears Intact Proprioception: Appears Intact Stereognosis: Not tested Coordination Gross Motor Movements are Fluid and Coordinated: Yes Fine Motor Movements are Fluid and Coordinated: Yes Motor  Motor Motor: Other (comment);Motor perseverations Motor - Skilled Clinical Observations: generalized weakness, some residual weakness in Lt hand from prior stroke  Mobility Bed Mobility Bed Mobility: Supine to Sit Supine to Sit: Supervision/Verbal cueing Transfers Transfers: Sit to Stand;Stand Pivot Transfers;Stand to Sit Sit to Stand: Supervision/Verbal cueing Stand to Sit: Supervision/Verbal cueing Stand Pivot Transfers: Supervision/Verbal cueing Transfer (Assistive device): None Locomotion  Gait Ambulation: Yes Gait Assistance: Supervision/Verbal cueing Gait Distance (Feet): 500 Feet Assistive device: None Gait Gait: Yes Gait Pattern: Impaired Gait Pattern: Decreased stride length Stairs / Additional Locomotion Stairs: Yes Stairs Assistance: Supervision/Verbal cueing Stair Management Technique: Two rails Number  of Stairs: 12 Height of Stairs: 6 Wheelchair Mobility Wheelchair Mobility: No  Trunk/Postural Assessment  Cervical Assessment Cervical Assessment: Within Functional Limits Thoracic Assessment Thoracic Assessment: Within Functional Limits Lumbar Assessment Lumbar Assessment: Within Functional Limits Postural Control Postural Control: Deficits on evaluation  Balance *** Static Sitting Balance Static Sitting - Balance Support: Feet supported Static Sitting - Level of Assistance: 7: Independent Dynamic Sitting Balance Dynamic Sitting - Balance Support: During functional activity Dynamic Sitting - Level  of Assistance: 5: Stand by assistance Extremity Assessment *** RUE Assessment RUE Assessment: Within Functional Limits LUE Assessment LUE Assessment: Within Functional Limits       Tex Filbert 12/28/2023, 4:09 PM

## 2023-12-29 DIAGNOSIS — I639 Cerebral infarction, unspecified: Secondary | ICD-10-CM | POA: Diagnosis not present

## 2023-12-29 LAB — GLUCOSE, CAPILLARY
Glucose-Capillary: 104 mg/dL — ABNORMAL HIGH (ref 70–99)
Glucose-Capillary: 99 mg/dL (ref 70–99)
Glucose-Capillary: 120 mg/dL — ABNORMAL HIGH (ref 70–99)
Glucose-Capillary: 101 mg/dL — ABNORMAL HIGH (ref 70–99)

## 2023-12-29 NOTE — Progress Notes (Signed)
 PROGRESS NOTE   Subjective/Complaints:  Pt reports no issues.  LBM yesterday-  denies pain.   ROS:  Negative except for HPI   Pt denies SOB, abd pain, CP, N/V/C/D, and vision changes    Objective:   No results found. Recent Labs    12/27/23 0602  WBC 6.1  HGB 12.8*  HCT 38.2*  PLT 184    Recent Labs    12/27/23 0602  NA 138  K 3.5  CL 110  CO2 20*  GLUCOSE 106*  BUN 18  CREATININE 1.00  CALCIUM  9.0     Intake/Output Summary (Last 24 hours) at 12/29/2023 0828 Last data filed at 12/29/2023 0738 Gross per 24 hour  Intake 1010 ml  Output 400 ml  Net 610 ml        Physical Exam: Vital Signs Blood pressure (!) 147/94, pulse 72, temperature 97.7 F (36.5 C), temperature source Oral, resp. rate 18, height 5\' 10"  (1.778 m), weight 92.4 kg, SpO2 98%.       General: awake, alert, appropriate, NAD HENT: conjugate gaze; oropharynx moist CV: regular rate and rhythm; no JVD Pulmonary: CTA B/L; no W/R/R- good air movement GI: soft, NT, ND, (+)BS Psychiatric: appropriate, bright affect Neurological: more alert- and remembers my role- not my name  Ext: no peripheral edema  PRIOR EXAMS: Neurological: keeps falling asleep decreased memory and tangential- word substitutions noted, some stuttering; mild LUE weakness- 4+/5 but LLE 5/5- some apraxia noted  Mental Status: AAO to May 2025, note date, not city, Hospital, knows its Wednesday, decreased memory, Able to name president, able to provide his birthday. Delayed responses, Poor safety awareness. Distractible and poor attention. Confused.  R gaze preference  Speech/Languate: Naming and repetition intact, fluent, follows simple commands but has more difficulty with multi step or complex commands CRANIAL NERVES: II: PERRL. Visual fields full III, IV, VI: EOM overall intact, requires multiple attempts to complete V: normal sensation bilaterally VII: Mild  left facial weakness  VIII: Hard of hearing IX, X: normal palatal elevation XI: 5/5 head turn and 5/5 shoulder shrug bilaterally XII: Tongue midline     MOTOR: RUE: 5/5 Deltoid, 5/5 Biceps, 5/5 Triceps,5/5 Grip LUE: 5/5 Deltoid, 4+/5 Biceps, 4+/5 Triceps, 4+/5 Grip RLE: HF 5/5, KE 5/5, ADF 5/5, APF 5/5 LLE: HF 5/5, KE 5/5, ADF 5/5, APF 5/5   Possible left LE apraxia    REFLEXES: No ankle clonus   SENSORY: Normal to touch all 4 extremities   No hypertonia noted   Assessment/Plan: 1. Functional deficits which require 3+ hours per day of interdisciplinary therapy in a comprehensive inpatient rehab setting. Physiatrist is providing close team supervision and 24 hour management of active medical problems listed below. Physiatrist and rehab team continue to assess barriers to discharge/monitor patient progress toward functional and medical goals  Care Tool:  Bathing    Body parts bathed by patient: Right arm, Left arm, Chest, Abdomen, Front perineal area, Buttocks, Right upper leg, Left upper leg, Right lower leg, Left lower leg, Face         Bathing assist Assist Level: Supervision/Verbal cueing     Upper Body Dressing/Undressing Upper body dressing   What  is the patient wearing?: Pull over shirt    Upper body assist Assist Level: Independent    Lower Body Dressing/Undressing Lower body dressing      What is the patient wearing?: Underwear/pull up, Pants     Lower body assist Assist for lower body dressing: Supervision/Verbal cueing     Toileting Toileting    Toileting assist Assist for toileting: Supervision/Verbal cueing     Transfers Chair/bed transfer  Transfers assist     Chair/bed transfer assist level: Supervision/Verbal cueing     Locomotion Ambulation   Ambulation assist      Assist level: Supervision/Verbal cueing Assistive device: No Device Max distance: 200   Walk 10 feet activity   Assist     Assist level:  Supervision/Verbal cueing Assistive device: No Device   Walk 50 feet activity   Assist    Assist level: Supervision/Verbal cueing Assistive device: No Device    Walk 150 feet activity   Assist    Assist level: Minimal Assistance - Patient > 75% Assistive device: No Device    Walk 10 feet on uneven surface  activity   Assist     Assist level: Minimal Assistance - Patient > 75%     Wheelchair     Assist Is the patient using a wheelchair?: Yes Type of Wheelchair: Manual    Wheelchair assist level: Supervision/Verbal cueing Max wheelchair distance: 150    Wheelchair 50 feet with 2 turns activity    Assist        Assist Level: Supervision/Verbal cueing   Wheelchair 150 feet activity     Assist      Assist Level: Supervision/Verbal cueing   Blood pressure (!) 147/94, pulse 72, temperature 97.7 F (36.5 C), temperature source Oral, resp. rate 18, height 5\' 10"  (1.778 m), weight 92.4 kg, SpO2 98%.  Medical Problem List and Plan: 1. 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   Walking well with gait belt, but cognition still very affected- poor initiation per therapy- will add Amantadine 100 mg daily- monitor BP  -improved initiation today- Amantadine might be dropping BP slightly- will monitor 2.  Antithrombotics: -DVT/anticoagulation:  Pharmaceutical: Lovenox  40mg  daily -antiplatelet therapy:  ASA and Ticagrelor  X 4 weeks followed by ASA alone 3. Pain Management: Tylenol  prn.   5/22- denies pain 4. Mood/Behavior/Sleep: LCSW to follow for evaluation and support.              -antipsychotic agents: N/A             -Continue lexapro  5mg  daily 5. Neuropsych/cognition: This  patient may be intermittently capable of making decisions on his own behalf. 6. Skin/Wound Care: Routine pressure relief measures.  7. Fluids/Electrolytes/Nutrition: Monitor I/O. Monitor labs, continue vitamins/supplements  -12/23/23 hypokalemia as below #16, remainder of labs unremarkable 8. Delirium: Per family has had issues with confusion since yesterday. Low grade fevers are resolving             --Work up 05/20-->CXR- neg. Procal <0.10  WBC 7.1 --Monitor sleep wake disruption (has  not slept for more than 24 hours per wife). Tele sitter ordered for fall prevention. Seroquel  25mg  nightly added to help with sleep.  5/22- T 99.3 this AM, but otherwise no temps in 24 hours- WBC is stable- 6.4- rechecking U/A and Cx- could be due to stroke, however wife per chart, felt it came AFTER stroke- but admitted was having issues at home prior- also per chart. Will get in SLP to tease out what's going on.   5/23- last CXR from 5/20 negative and U/A and UCx 5/22 (-) for UTI  5/26- WBC 6.1 9. HTN: Monitor BP TID--remains labile. Monitor with activity.  --Continue Cozaar  and avoid hypotension due to intracranial atherosclerosis.  5/22-out of permissive HTN timing-  BP running up to 170/99- this AM- losartan  was restarted 25 mg daily- but metoprolol  was not- I don't think Metoprolol  appropriate right now- HR is in 70's off metoprolol - so I think will start Norvasc  2.5 mg daily to help mildly as well- to AVOID HYPOTENSION 5/23- Pt's BP is running 140s-160s but slightly better than yesterday- won't increase meds yet, so doesn't cause hypotension -5/24-25/25 BPs fairly stable/improving, monitor trend given recent med changes 5/26- BP too low this AM- will stop Norvasc  2.5 mg since BP on low side- to avoid hypotension.  5/27- BP running 110s systolic- will monitor and see if needs to reduce BP meds? Already stopped Norvasc  5/28- BP doing better 120s-140s systolic- con't regimen Vitals:   12/25/23 1934 12/26/23 0710  12/26/23 1238 12/26/23 2046  BP: 126/72 (!) 127/99 120/81 (!) 141/82   12/27/23 0532 12/27/23 0928 12/27/23 1438 12/27/23 1949  BP: 107/74 111/77 113/80 122/82   12/28/23 0421 12/28/23 1308 12/28/23 2004 12/29/23 0355  BP: 139/80 122/81 135/88 (!) 147/94    10. T2DM: Hgb A1C- 6.7. Monitor BS ac/hs and use SSI for elevated BS             --CM restrictions added to diet.   5/22- CBGs running 85-125- will con't to monitor -5/24-25/25 CBGs fine, using very little insulin  in last 24hrs; monitor for now, but may eventually d/c checks given decent A1C-- consider on Monday 5/26- Will decrease CBG checks to BID- [facility] 5/28- Cbg's look great- con't regimen CBG (last 3)  Recent Labs    12/28/23 0607 12/28/23 1633 12/29/23 0614  GLUCAP 106* 107* 104*    11. Hyperlipidemia: LDL @ goal. Continue Lipitor  80 mg 12. Prostate CA: Was on Flomax  0.8 mg till a few months ago.  --Has had hesitancy recently. Hx of retention in the past due to UTI.              --will monitor PVR w/bladder scan. Check UA-- neg 5/23             -Continue flomax , currently 0.4mg  daily -12/25/23 low PVRs, monitor another day but could d/c this as well if continued to improve-- continued to be low 5/25, d/c'd PVRs 5/26- d/c bladder scans 13. Class 1 Obesity: Educated on importance of activity and weight loss Body mass index is 29.23 kg/m. 14. H/o gout: Stable on allopurinol  300mg  daily 15. Mood disorder: On Lexapro  5mg  daily.  16. Hypokalemia  5/22- will replete K+ for 3.3 K+ level- 40 mEq x1. Will recheck Monday  5/26- K+ 3.5- low but in normal range- will recheck Thursday 17. GI ppx: protonix  40mg  daily     LOS: 7 days A FACE TO FACE EVALUATION WAS PERFORMED  Ervine Witucki 12/29/2023, 8:28 AM

## 2023-12-29 NOTE — Progress Notes (Signed)
 Inpatient Rehabilitation Care Coordinator Discharge Note   Patient Details  Name: Tyler Guerra. MRN: [id number] Date of Birth: 1951/06/27   Discharge location: D/c to home with his wife  Length of Stay: 7 days  Discharge activity level: Supervision  Home/community participation: Limited  Patient response IO:NGEXBM Literacy - How often do you need to have someone help you when you read instructions, pamphlets, or other written material from your doctor or pharmacy?: Rarely  Patient response WU:XLKGMW Isolation - How often do you feel lonely or isolated from those around you?: Never  Services provided included: MD, RD, PT, OT, SLP, RN, CM, Neuropsych, SW, Pharmacy, TR  Financial Services:  Financial Services Utilized: Medicare    Choices offered to/list presented to: patient and pt wife  Follow-up services arranged:  Outpatient    Outpatient Servicies: [facility] for PT/OT/SLP      Patient response to transportation need: Is the patient able to respond to transportation needs?: Yes In the past 12 months, has lack of transportation kept you from medical appointments or from getting medications?: No In the past 12 months, has lack of transportation kept you from meetings, work, or from getting things needed for daily living?: No   Patient/Family verbalized understanding of follow-up arrangements:  Yes  Individual responsible for coordination of the follow-up plan: contact pt wife Tyler Guerra  Confirmed correct DME delivered: Rennis Case 12/29/2023    Comments (or additional information):  Summary of Stay    Date/Time Discharge Planning CSW  12/28/23 1115 D/C home with wife, dtr next door, other dtr close by and grandson will be available to assist as needed. 2 story main living area since stroke 12/2022. AAC  12/24/23 0826 D/C home with wife, dtr next door, other dtr close by and grandson will be available to assist as needed. 2 story main  living area since stroke 12/2022. DBS       Tyler Guerra Tyler Guerra

## 2023-12-29 NOTE — Progress Notes (Signed)
 Speech Language Pathology Discharge Summary  Patient Details  Name: Tyler Guerra. MRN: [id number] Date of Birth: 1950/09/26  Date of Discharge from SLP service:Dec 29, 2023  Today's Date: 12/29/2023 SLP Individual Time: 0801-0902 SLP Individual Time Calculation (min): 61 min   Skilled Therapeutic Interventions:  Pt was seen in am to address cognitive re- training. Pt encountered finishing up in the bathroom. SLP assisted pt in return to bed with pt following simple directions indep. Pt was oriented to month, year, and day of week given min cues to attend to external aid. He also recalled upcoming discharge from CIR planned for next day. Pt was subsequently challenged in interpretation of therapy schedule which he completed with sup to min A which is a noted improvement from previous session. Pt demonstrating intellectual awareness of cognitve deficits thus SLP reviewed WRAP memory strategies and examples of utilization. SLP also reviewed external aids including use of phone for notes with pt verbalized comprehension. PT was subsequently challenged in paragraph retention of moderate level information. SLP guided pt in use of repetition and association strategies with pt able to recall info after a 20 minute distracted delay with 83% acc indep improving to 100% with min A. SLP also addressing problem solving through challenging pt to identify problems and solutions given a picture card scenario (focus placed on safe ambulation). Pt identifies problems and solutions with 100% acc given min A. At conclusion of session pt was left upright in bed with call button within reach and bed alarm active. SLP to continue POC.  Patient has met 4 of 4 long term goals.  Patient to discharge at overall Min;Mod level.  Reasons goals not met:     Clinical Impression/Discharge Summary: Pt has made steady gains and has met 4 of 4 LTG's this admission due to improved recall, problem solving, attention, and awareness.  Pt is currently an overall min to mod A for cognitive tasks. Pt/ family education complete and pt will discharge home with 24 hour supervision from family. Pt would benefit fom outpatient/ home health f/u SLP services to maximize cognition  in order to maximize his functional independence.  Care Partner:  Caregiver Able to Provide Assistance: Yes  Type of Caregiver Assistance: Cognitive  Recommendation:  Outpatient SLP;24 hour supervision/assistance  Rationale for SLP Follow Up: Maximize cognitive function and independence   Equipment: none   Reasons for discharge: Treatment goals met;Discharged from hospital   Patient/Family Agrees with Progress Made and Goals Achieved: Yes    Adela Holter 12/29/2023, 11:46 AM

## 2023-12-29 NOTE — Progress Notes (Signed)
 Occupational Therapy Session Note  Patient Details  Name: Tyler Guerra. MRN: [id number] Date of Birth: Oct 24, 1950  Today's Date: 12/29/2023 OT Individual Time: 0904-0932+1356-1506 OT Individual Time Calculation (min): 28 min    Short Term Goals: Week 1:  OT Short Term Goal 1 (Week 1): STG=LTG d/t ELOS  Skilled Therapeutic Interventions/Progress Updates:  Session 1: Pt greeted supine in bed, pt agreeable to OT intervention.      Transfers/bed mobility/functional mobility:  Pt completed supine>sit MODI with use of bed features. Pt completed ambulatory transfer to recliner with no AD and supervision.    Exercises: pt completed below BUE therex for global strengthening endurance with level 3 theraband:   X10 shoulder flexion  X10 bicep curls X10 shoulder horizontal ABD X10 shoulder diagonal pulls X10 shoulder extension  X10 alternating punches   Issued pt written HEP to increase carryover, pt able to teach back therex.    Ended session with pt seated in recliner with all needs within reach and safety belt alarm activated.                   Session 2: Pt greeted supine in bed, pt agreeable to OT intervention.     Pt requested to review therex from AM session, pt able to teach back therex as indicated above with MIN cues. Educated pt on accessing digital version of HEP with videos from iphone.   Transfers/bed mobility/functional mobility: pt completed bed mobility MODI, pt completed all functional ambulation greater than a household distance with no AD and supervision.   Therapeutic activity: pt completed standing single leg stance balance challenge with pt instructed to tap one cone at a time with BUE support from table in horizontal pattern to challenge single leg stance for higher level LB ADLs.   Graded task up and had pt stand on airex cushion with unilateral support to step laterally onto indicated discs to promote weightbearing into hemi body and to challenge quad  strength in order to power back up onto airex. Pt completed task for 3 mins with CGA for balance, no LOB.   Worked on lateral stepping with pt instructed to step over cones to R and L side to simulate shower transfer and challenge dynamic balance. Pt completed task with CGA and MOD cues for technique as pt wanting to step around cone vs over it.    ADLs:  Grooming: pt stood at sink for hand hygiene with supervision.  Transfers: ambulatory toilet transfer with noAd and supervision.  Toileting: pt completed 3/3 toileting tasks with supervision, continent urine void.    IADLS:pt completed simulated IADL task of collecting items from various surface heights to simulate cleaning up at home. Pt completed task with no AD and supervision. Pt then able to stand on Beep board with no UE support to fold washcloths to simulate IADL tasks and challenge balance. Pt completed task with CGA and no LOB.     Exercises: pt completed below BLE therex to facilitate improved global strength/endurance:  pt completed x10 sit>stands while holding 3 lb weighed ball to challenge LB strength/endurance, graded task up and had pt stand while pressing 3lb ball OH x10 reps. Pt then instructed to stand with 3 lb weighted ball and place ball in basketball goal to then retreive ball from floor level and dunk bball again to challenge motor planning, endurance and simulate functional movement pattern during ADLs. Pt completed task with CGA and no LOB.   Pt completed alternating step ups on  3 inch step with no UE support while holding 3lb weighted ball  Pt completed x10 reps of staggered lunges to challenge balance and promote LB strengthening.   Ended session with pt seated EOB, NT present.                Therapy Documentation Precautions:  Precautions Precautions: Fall Recall of Precautions/Restrictions: Impaired Precaution/Restrictions Comments: pt is impulsive Restrictions Weight Bearing Restrictions Per Provider  Order: No  Pain: No pain reported during either session    Therapy/Group: Individual Therapy  Willadean Hark 12/29/2023, 12:12 PM

## 2023-12-29 NOTE — Progress Notes (Signed)
 Physical Therapy Session Note  Patient Details  Name: Tyler Guerra. MRN: [id number] Date of Birth: 1951-06-16  Today's Date: 12/29/2023 PT Individual Time: 0955-1050 PT Individual Time Calculation (min): 55 min   Short Term Goals: Week 1:  PT Short Term Goal 1 (Week 1): =LTGs d/t ELOS  Skilled Therapeutic Interventions/Progress Updates: Pt presented in recliner agreeable to therapy. Pt denies pain throughout session. Session focused on balance tests and functional mobility in preparation for d/c. Pt ambulated >374ft without AD CGA fading to supervision. Pt completed car transfer, ramp, and ambulation on mulch with supervision overall. Pt then ambulated to main gym and completed ascending/descending x 12 steps with B rails and supervision. Pt also completed TUG and Berg balance assessment with improved scores of 11.8 and 48/56 respectively. Pt also worked on dynamic balance stepping over hurdles forward and side stepping over hurdles. Pt required intermittent cues for maintaining forward hips. Pt ambulated back to room at end of session and returned to recliner. Pt left in recliner with call bell within reach and needs met.      Therapy Documentation Precautions:  Precautions Precautions: Fall Recall of Precautions/Restrictions: Impaired Precaution/Restrictions Comments: pt is impulsive Restrictions Weight Bearing Restrictions Per Provider Order: No General:   Vital Signs: Therapy Vitals Temp: 97.7 F (36.5 C) Temp Source: Oral Pulse Rate: 88 Resp: 18 BP: 124/89 Patient Position (if appropriate): Sitting Oxygen Therapy SpO2: 100 % O2 Device: Room Air   Therapy/Group: Individual Therapy  Haillie Radu 12/29/2023, 4:38 PM

## 2023-12-29 NOTE — Plan of Care (Signed)
  Problem: RH Problem Solving Goal: LTG Patient will demonstrate problem solving for (SLP) Description: LTG:  Patient will demonstrate problem solving for basic/complex daily situations with cues  (SLP) Outcome: Completed/Met Flowsheets (Taken 12/23/2023 1515 by Sockwell, Cassidi F, CCC-SLP) LTG: Patient will demonstrate problem solving for (SLP): Basic daily situations LTG Patient will demonstrate problem solving for: Moderate Assistance - Patient 50 - 74%   Problem: RH Memory Goal: LTG Patient will demonstrate ability for day to day (SLP) Description: LTG:   Patient will demonstrate ability for day to day recall/carryover during cognitive/linguistic activities with assist  (SLP) Outcome: Completed/Met Flowsheets Taken 12/29/2023 1143 by Adela Holter, CCC-SLP LTG: Patient will demonstrate ability for day to day recall/carryover during cognitive/linguistic activities with assist (SLP): Minimal Assistance - Patient > 75% Taken 12/23/2023 1515 by Sockwell, Cassidi F, CCC-SLP LTG: Patient will demonstrate ability for day to day recall: New information   Problem: RH Attention Goal: LTG Patient will demonstrate this level of attention during functional activites (SLP) Description: LTG:  Patient will will demonstrate this level of attention during functional activites (SLP) Outcome: Completed/Met Flowsheets (Taken 12/23/2023 1515 by Sockwell, Cassidi F, CCC-SLP) Patient will demonstrate during cognitive/linguistic activities the attention type of: Sustained LTG: Patient will demonstrate this level of attention during cognitive/linguistic activities with assistance of (SLP): Moderate Assistance - Patient 50 - 74% Number of minutes patient will demonstrate attention during cognitive/linguistic activities: 15   Problem: RH Awareness Goal: LTG: Patient will demonstrate awareness during functional activites type of (SLP) Description: LTG: Patient will demonstrate awareness during functional activites  type of (SLP) Outcome: Completed/Met Flowsheets (Taken 12/23/2023 1515 by Sockwell, Cassidi F, CCC-SLP) Patient will demonstrate during cognitive/linguistic activities awareness type of: Intellectual LTG: Patient will demonstrate awareness during cognitive/linguistic activities with assistance of (SLP): Moderate Assistance - Patient 50 - 74%

## 2023-12-29 NOTE — Plan of Care (Signed)
  Problem: Consults Goal: RH STROKE PATIENT EDUCATION Description: See Patient Education module for education specifics  Outcome: Progressing Goal: Nutrition Consult-if indicated Outcome: Progressing   Problem: RH BOWEL ELIMINATION Goal: RH STG MANAGE BOWEL WITH ASSISTANCE Description: STG Manage Bowel with min Assistance. Outcome: Progressing Goal: RH STG MANAGE BOWEL W/MEDICATION W/ASSISTANCE Description: STG Manage Bowel with Medication with min Assistance. Outcome: Progressing   Problem: RH BLADDER ELIMINATION Goal: RH STG MANAGE BLADDER WITH ASSISTANCE Description: STG Manage Bladder With min Assistance Outcome: Progressing   Problem: RH SKIN INTEGRITY Goal: RH STG SKIN FREE OF INFECTION/BREAKDOWN Description: Prevention of skin break down or infection with min assist Outcome: Progressing   Problem: RH SAFETY Goal: RH STG ADHERE TO SAFETY PRECAUTIONS W/ASSISTANCE/DEVICE Description: STG Adhere to Safety Precautions With min Assistance/Device. Outcome: Progressing   Problem: RH PAIN MANAGEMENT Goal: RH STG PAIN MANAGED AT OR BELOW PT'S PAIN GOAL Description: Pain scale <3/10 Outcome: Progressing   Problem: RH KNOWLEDGE DEFICIT Goal: RH STG INCREASE KNOWLEDGE OF HYPERTENSION Description: Patient will be able to verbalize ways to manage hypertension with min assist Outcome: Progressing Goal: RH STG INCREASE KNOWLEGDE OF HYPERLIPIDEMIA Description: Patient will be able to verbalize medication for hyperlipidimia with min assist Outcome: Progressing Goal: RH STG INCREASE KNOWLEDGE OF STROKE PROPHYLAXIS Description: Patient will be able to name stroke prevention medications with min assist Outcome: Progressing

## 2023-12-30 ENCOUNTER — Other Ambulatory Visit: Payer: Self-pay | Admitting: Physical Medicine and Rehabilitation

## 2023-12-30 ENCOUNTER — Other Ambulatory Visit (HOSPITAL_COMMUNITY): Payer: Self-pay

## 2023-12-30 LAB — CBC
HCT: 40.8 % (ref 39.0–52.0)
Hemoglobin: 13.5 g/dL (ref 13.0–17.0)
MCH: 30.3 pg (ref 26.0–34.0)
MCHC: 33.1 g/dL (ref 30.0–36.0)
MCV: 91.7 fL (ref 80.0–100.0)
Platelets: 194 10*3/uL (ref 150–400)
RBC: 4.45 MIL/uL (ref 4.22–5.81)
RDW: 13.2 % (ref 11.5–15.5)
WBC: 6.4 10*3/uL (ref 4.0–10.5)
nRBC: 0 % (ref 0.0–0.2)

## 2023-12-30 LAB — BASIC METABOLIC PANEL WITH GFR
Anion gap: 9 (ref 5–15)
BUN: 26 mg/dL — ABNORMAL HIGH (ref 8–23)
CO2: 20 mmol/L — ABNORMAL LOW (ref 22–32)
Calcium: 9.3 mg/dL (ref 8.9–10.3)
Chloride: 110 mmol/L (ref 98–111)
Creatinine, Ser: 1.13 mg/dL (ref 0.61–1.24)
GFR, Estimated: >60 mL/min (ref >60)
Glucose, Bld: 109 mg/dL — ABNORMAL HIGH (ref 70–99)
Potassium: 3.7 mmol/L (ref 3.5–5.1)
Sodium: 139 mmol/L (ref 135–145)

## 2023-12-30 LAB — GLUCOSE, CAPILLARY: Glucose-Capillary: 95 mg/dL (ref 70–99)

## 2023-12-30 MED ORDER — POLYVINYL ALCOHOL 1.4 % OP SOLN
1.0000 [drp] | Freq: Every day | OPHTHALMIC | Status: AC | PRN
Start: 2023-12-30 — End: ?

## 2023-12-30 MED ORDER — TAMSULOSIN HCL 0.4 MG PO CAPS: 0.4000 mg | ORAL_CAPSULE | Freq: Every day | ORAL | Status: DC

## 2023-12-30 MED ORDER — CALCIUM POLYCARBOPHIL 625 MG PO TABS
625.0000 mg | ORAL_TABLET | Freq: Every day | ORAL | 0 refills | Status: AC
  Filled 2023-12-30: qty 30, 30d supply, fill #0

## 2023-12-30 MED ORDER — ATORVASTATIN CALCIUM 80 MG PO TABS: 80.0000 mg | ORAL_TABLET | Freq: Every day | ORAL | Status: DC

## 2023-12-30 MED ORDER — TAMSULOSIN HCL 0.4 MG PO CAPS: 0.4000 mg | ORAL_CAPSULE | Freq: Every day | ORAL | Status: AC

## 2023-12-30 MED ORDER — TICAGRELOR 60 MG PO TABS: 90.0000 mg | ORAL_TABLET | Freq: Two times a day (BID) | ORAL | Status: AC

## 2023-12-30 MED ORDER — QUETIAPINE FUMARATE 25 MG PO TABS
25.0000 mg | ORAL_TABLET | Freq: Every day | ORAL | 0 refills | Status: DC
  Filled 2023-12-30: qty 30, 30d supply, fill #0

## 2023-12-30 MED ORDER — TICAGRELOR 90 MG PO TABS
90.0000 mg | ORAL_TABLET | Freq: Two times a day (BID) | ORAL | 0 refills | Status: DC
Start: 2023-12-30 — End: 2024-02-01

## 2023-12-30 MED ORDER — AMANTADINE HCL 100 MG PO CAPS
100.0000 mg | ORAL_CAPSULE | Freq: Every day | ORAL | 0 refills | Status: DC
  Filled 2023-12-30: qty 30, 30d supply, fill #0

## 2023-12-30 NOTE — Plan of Care (Signed)
  Problem: Consults Goal: RH STROKE PATIENT EDUCATION Description: See Patient Education module for education specifics  Outcome: Progressing Goal: Nutrition Consult-if indicated Outcome: Progressing   Problem: RH BOWEL ELIMINATION Goal: RH STG MANAGE BOWEL WITH ASSISTANCE Description: STG Manage Bowel with min Assistance. Outcome: Progressing Goal: RH STG MANAGE BOWEL W/MEDICATION W/ASSISTANCE Description: STG Manage Bowel with Medication with min Assistance. Outcome: Progressing   Problem: RH BLADDER ELIMINATION Goal: RH STG MANAGE BLADDER WITH ASSISTANCE Description: STG Manage Bladder With min Assistance Outcome: Progressing   Problem: RH SKIN INTEGRITY Goal: RH STG SKIN FREE OF INFECTION/BREAKDOWN Description: Prevention of skin break down or infection with min assist Outcome: Progressing   Problem: RH SAFETY Goal: RH STG ADHERE TO SAFETY PRECAUTIONS W/ASSISTANCE/DEVICE Description: STG Adhere to Safety Precautions With min Assistance/Device. Outcome: Progressing   Problem: RH PAIN MANAGEMENT Goal: RH STG PAIN MANAGED AT OR BELOW PT'S PAIN GOAL Description: Pain scale <3/10 Outcome: Progressing   Problem: RH KNOWLEDGE DEFICIT Goal: RH STG INCREASE KNOWLEDGE OF HYPERTENSION Description: Patient will be able to verbalize ways to manage hypertension with min assist Outcome: Progressing Goal: RH STG INCREASE KNOWLEGDE OF HYPERLIPIDEMIA Description: Patient will be able to verbalize medication for hyperlipidimia with min assist Outcome: Progressing Goal: RH STG INCREASE KNOWLEDGE OF STROKE PROPHYLAXIS Description: Patient will be able to name stroke prevention medications with min assist Outcome: Progressing

## 2023-12-30 NOTE — Discharge Summary (Signed)
 Physician Discharge Summary  Patient ID: Tyler Guerra. MRN: [id number] DOB/AGE: 1951/01/08 73 y.o.  Admit date: 12/22/2023 Discharge date: 12/30/2023  Discharge Diagnoses:  Principal Problem:   Stroke, small vessel (HCC) Active Problems:   Hypertension   Anxiety state   Constipation   Diabetes mellitus without complication (HCC)   Delirium  Pre-renal azotemia  Hypotension  Sleep wake disruption  Hypokalemia  Discharged Condition: stable  Significant Diagnostic Studies: N/A   Labs:  Basic Metabolic Panel:    Latest Ref Rng & Units 12/30/2023    7:17 AM 12/27/2023    6:02 AM 12/23/2023    6:36 AM  BMP  Glucose 70 - 99 mg/dL 952  841  324   BUN 8 - 23 mg/dL 26  18  15    Creatinine 0.61 - 1.24 mg/dL 4.01  0.27  2.53   Sodium 135 - 145 mmol/L 139  138  141   Potassium 3.5 - 5.1 mmol/L 3.7  3.5  3.3   Chloride 98 - 111 mmol/L 110  110  109   CO2 22 - 32 mmol/L 20  20  24    Calcium  8.9 - 10.3 mg/dL 9.3  9.0  9.4      CBC:    Latest Ref Rng & Units 12/30/2023    7:17 AM 12/27/2023    6:02 AM 12/23/2023    6:36 AM  CBC  WBC 4.0 - 10.5 K/uL 6.4  6.1  6.4   Hemoglobin 13.0 - 17.0 g/dL 66.4  40.3  47.4   Hematocrit 39.0 - 52.0 % 40.8  38.2  38.4   Platelets 150 - 400 K/uL 194  184  174      CBG: Recent Labs  Lab 12/29/23 0614 12/29/23 1136 12/29/23 1629 12/29/23 2110 12/30/23 0657  GLUCAP 104* 99 120* 101* 95    Brief HPI:   Tyler Guerra. is a 73 y.o. 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  He was noted to be febrile in ED and tach lactic acidosis.  He was started on IV fluids and empiric antibiotics.  CTA head done showing focal occlusion versus severe near occlusive stenosis right anterior cerebral artery.  CT perfusion  showed no core infarct.  MRI brain showed 3 mm infarct right periatrial white matter with background parenchymal atrophy.  EEG was negative for seizures and was suggestive of encephalopathy.    Dr. Janett Medin felt that stroke was due to small vessel disease and recommended continuing aspirin  and Brilinta  x 30 days followed by aspirin  alone.  Loop recorder was interrogated and was negative for A-fib.  Antibiotics were discontinued on 05/20 as workup negative.  He continued to have issues with confusion and chest x-ray and CBC done which was negative for signs of infection.  His right intention was improving but he continued to be limited by weakness and cognitive deficits with delay in processing.  He was requiring min assist overall.  Therapy was working with him and CIR was recommended due to functional decline.   Hospital Course: Tyler Guerra. was admitted to rehab 12/22/2023 for inpatient therapies to consist of PT, ST and OT at least three hours five days a week. Past admission physiatrist, therapy team and rehab RN have worked together to provide customized  collaborative inpatient rehab.  Patient was maintained on DAPT during his stay and has tolerated this without side effects.  Follow-up CBC showed H&H and white count to be stable.  He continued to have issues with delirium as well as sleep-wake disruption.  Low-dose Seroquel was added and titrated upwards to help with sleep hygiene.  His blood pressures were monitored on TID basis and noted to be trending downward with increase in activity therefore metoprolol  not resumed, amlodipine was discontinued and Cozaar  titrated down.  Patient with history of urinary retention in the past and reported issues with hesitancy.  PVRs were checked showing low volumes therefore this was discontinued.  Mood has been stable on home dose Lexapro .  His wife has been very supportive and team has provided ego support and encouragement during his stay. His blood sugars were  monitored with Achs  basis and was stable on diet alone and CBG checks were discontinued 5/24.  Bowel program was augmented to help manage constipation. Serial check of lytes showed transient hypokalemia which has improved with brief supplementation. Mentation has greatly improved and he has made steady gains. His Berg Balance Score is at 48/56 and supervision is recommended at discharge. He will continue to receive follow up outpatient PT, OT and ST at [facility] after discharge.    Rehab course: During patient's stay in rehab weekly team conferences were held to monitor patient's progress, set goals and discuss barriers to discharge. At admission, patient required contact-guard assist with ADL tasks and min assist with mobility. He was tolerating regular diet without any signs or symptoms of aspiration.  He evaluated severe deficits in attention, recall, problem-solving as well as sustaining attention to task. He  has had improvement in activity tolerance, balance, postural control as well as ability to compensate for deficits.  He requires supervision with all BADLs and min verbal cues for initiation. He requires supervision for transfers and is able to ambulate 500' without use of AD. He is able to climb 12 stairs with use of 2 rails and supervision. He has had improvement in recall, problem solving, attention and awareness. He requires min to mod assist with cognitive tasks.  Family education has been completed.     Discharge disposition: 01-Home or Self Care  Diet: Heart Healthy. Carb Modified.   Special Instructions: Family to assist with medication management as well as cognitive tasks. No driving or strenuous activity. Increase fluid intake.  Recommend recheck BMET  in 1 to 2 weeks on posthospital follow-up.  Discharge Instructions     Ambulatory referral to Neurology   Complete by: As directed    An appointment is requested in approximately: 6 weeks   Ambulatory referral to  Occupational Therapy   Complete by: As directed    Eval and treat   Ambulatory referral to Physical Medicine Rehab   Complete by: As directed    Ambulatory referral to Physical Therapy   Complete by: As directed    Ambulatory referral to Speech Therapy   Complete by: As directed       Allergies as of 12/30/2023   No Known Allergies      Medication List     STOP taking these medications    metoprolol  tartrate 25 MG tablet Commonly known as: LOPRESSOR    Vitamin A 3 MG Caps       TAKE these medications    allopurinol  300 MG tablet Commonly known as: ZYLOPRIM  Take 1 tablet (300 mg total) by mouth daily.  amantadine 100 MG capsule Commonly known as: SYMMETREL Take 1 capsule (100 mg total) by mouth daily.   aspirin  EC 81 MG tablet Take 1 tablet (81 mg total) by mouth daily.   atorvastatin  80 MG tablet Commonly known as: LIPITOR  Take 1 tablet (80 mg total) by mouth at bedtime.   BLACK CHERRY CONCENTRATE PO Take 1 tablet by mouth at bedtime.   escitalopram  5 MG tablet Commonly known as: LEXAPRO  Take 1 tablet (5 mg total) by mouth daily.   Fiber-Lax 625 MG tablet Generic drug: polycarbophil Take 1 tablet (625 mg total) by mouth daily.   losartan  25 MG tablet Commonly known as: COZAAR  TAKE 1 TABLET DAILY   nitroGLYCERIN  0.4 MG SL tablet Commonly known as: NITROSTAT  Place 1 tablet (0.4 mg total) under the tongue every 5 (five) minutes as needed for chest pain.   OCUVITE PO Take 1 capsule by mouth daily.   pantoprazole  40 MG tablet Commonly known as: PROTONIX  Take 1 tablet (40 mg total) by mouth daily.   polyethylene glycol powder 17 GM/SCOOP powder Commonly known as: GLYCOLAX /MIRALAX  Take 17 g by mouth daily as needed for mild constipation.   polyvinyl alcohol  1.4 % ophthalmic solution Commonly known as: LIQUIFILM TEARS Place 1 drop into both eyes daily as needed for dry eyes.   QUEtiapine 25 MG tablet Commonly known as: SEROQUEL Take 1 tablet  (25 mg total) by mouth at bedtime.   sodium chloride  0.65 % Soln nasal spray Commonly known as: OCEAN Place 1 spray into both nostrils as needed for congestion.   Systane Balance 0.6 % Soln Generic drug: Propylene Glycol Place 1 drop into both eyes daily as needed (dry eyes).   tamsulosin  0.4 MG Caps capsule Commonly known as: FLOMAX  Take 1 capsule (0.4 mg total) by mouth daily after supper. What changed: when to take this   ticagrelor  90 MG Tabs tablet Commonly known as: BRILINTA  Take 1 tablet (90 mg total) by mouth 2 (two) times daily.   VITAMIN D  PO Take 1 capsule by mouth daily.        Follow-up Information     Donnie Galea, MD Follow up.   Specialty: Family Medicine Why: Call in 1-2 days for post hospital follow up Contact information: [address] [place] [address] [phone]         Celia Coles, MD Follow up.   Specialty: Physical Medicine and Rehabilitation Why: office will call you with follow up appointment Contact information: [address] [place] [address] [phone]         Patel, Donika K, DO Follow up.   Specialty: Neurology Why: office will call you with follow up appointment Contact information: [address] [place] [address] [phone]                 Signed: Zelda Hickman 12/31/2023, 4:43 PM

## 2023-12-30 NOTE — Progress Notes (Signed)
 PROGRESS NOTE   Subjective/Complaints:  Pt reports doing well- denies pain; fever, any problems other than stroke, which he's been here for.  LBM 2 days ago- which is normal for him.  ROS:  Negative except for HPI  Pt denies SOB, abd pain, CP, N/V/C/D, and vision changes   Objective:   No results found. Recent Labs    12/30/23 0717  WBC 6.4  HGB 13.5  HCT 40.8  PLT 194    Recent Labs    12/30/23 0717  NA 139  K 3.7  CL 110  CO2 20*  GLUCOSE 109*  BUN 26*  CREATININE 1.13  CALCIUM  9.3     Intake/Output Summary (Last 24 hours) at 12/30/2023 1038 Last data filed at 12/29/2023 1823 Gross per 24 hour  Intake 240 ml  Output --  Net 240 ml        Physical Exam: Vital Signs Blood pressure 115/80, pulse 76, temperature 97.8 F (36.6 C), resp. rate 18, height 5\' 10"  (1.778 m), weight 92.4 kg, SpO2 100%.        General: awake, alert, appropriate, wife at bedside; NAD HENT: conjugate gaze; oropharynx moist CV: regular rate and rhythm; no JVD Pulmonary: CTA B/L; no W/R/R- good air movement GI: soft, NT, ND, (+)BS Psychiatric: appropriate Neurological: improvedm slightly, memory-   Ext: no peripheral edema  PRIOR EXAMS: Neurological: keeps falling asleep decreased memory and tangential- word substitutions noted, some stuttering; mild LUE weakness- 4+/5 but LLE 5/5- some apraxia noted  Mental Status: AAO to May 2025, note date, not city, Hospital, knows its Wednesday, decreased memory, Able to name president, able to provide his birthday. Delayed responses, Poor safety awareness. Distractible and poor attention. Confused.  R gaze preference  Speech/Languate: Naming and repetition intact, fluent, follows simple commands but has more difficulty with multi step or complex commands CRANIAL NERVES: II: PERRL. Visual fields full III, IV, VI: EOM overall intact, requires multiple attempts to complete V:  normal sensation bilaterally VII: Mild left facial weakness  VIII: Hard of hearing IX, X: normal palatal elevation XI: 5/5 head turn and 5/5 shoulder shrug bilaterally XII: Tongue midline     MOTOR: RUE: 5/5 Deltoid, 5/5 Biceps, 5/5 Triceps,5/5 Grip LUE: 5/5 Deltoid, 4+/5 Biceps, 4+/5 Triceps, 4+/5 Grip RLE: HF 5/5, KE 5/5, ADF 5/5, APF 5/5 LLE: HF 5/5, KE 5/5, ADF 5/5, APF 5/5   Possible left LE apraxia    REFLEXES: No ankle clonus   SENSORY: Normal to touch all 4 extremities   No hypertonia noted   Assessment/Plan: 1. Functional deficits which require 3+ hours per day of interdisciplinary therapy in a comprehensive inpatient rehab setting. Physiatrist is providing close team supervision and 24 hour management of active medical problems listed below. Physiatrist and rehab team continue to assess barriers to discharge/monitor patient progress toward functional and medical goals  Care Tool:  Bathing    Body parts bathed by patient: Right arm, Left arm, Chest, Abdomen, Front perineal area, Buttocks, Right upper leg, Left upper leg, Right lower leg, Left lower leg, Face         Bathing assist Assist Level: Supervision/Verbal cueing     Upper Body Dressing/Undressing  Upper body dressing   What is the patient wearing?: Pull over shirt    Upper body assist Assist Level: Independent    Lower Body Dressing/Undressing Lower body dressing      What is the patient wearing?: Underwear/pull up, Pants     Lower body assist Assist for lower body dressing: Supervision/Verbal cueing     Toileting Toileting    Toileting assist Assist for toileting: Supervision/Verbal cueing     Transfers Chair/bed transfer  Transfers assist     Chair/bed transfer assist level: Supervision/Verbal cueing     Locomotion Ambulation   Ambulation assist      Assist level: Supervision/Verbal cueing Assistive device: No Device Max distance: 300   Walk 10 feet  activity   Assist     Assist level: Supervision/Verbal cueing Assistive device: No Device   Walk 50 feet activity   Assist    Assist level: Supervision/Verbal cueing Assistive device: No Device    Walk 150 feet activity   Assist    Assist level: Supervision/Verbal cueing Assistive device: No Device    Walk 10 feet on uneven surface  activity   Assist     Assist level: Contact Guard/Touching assist     Wheelchair     Assist Is the patient using a wheelchair?: No Type of Wheelchair: Manual    Wheelchair assist level: Supervision/Verbal cueing Max wheelchair distance: 150    Wheelchair 50 feet with 2 turns activity    Assist        Assist Level: Supervision/Verbal cueing   Wheelchair 150 feet activity     Assist      Assist Level: Supervision/Verbal cueing   Blood pressure 115/80, pulse 76, temperature 97.8 F (36.6 C), resp. rate 18, height 5\' 10"  (1.778 m), weight 92.4 kg, SpO2 100%.  Medical Problem List and Plan: 1. Functional deficits secondary to acute CVA R parietal periventricular white matter              -patient may shower             -ELOS/Goals: 10-14 days, Sup to min A OT, Sup PT, Sup SLP             -Admit to CIR  D/c today- with f/u with me; and Neuro- and asked to make f/u with PCP in next 30 days-   Walking well with gait belt, but cognition still very affected- poor initiation per therapy- will add Amantadine 100 mg daily- monitor BP  -improved initiation today- Amantadine might be dropping BP slightly- will monitor  5/29- needs meds to [facility] in [place]  Educated wife about cognition- amantadine and reasons to continue 2.  Antithrombotics: -DVT/anticoagulation:  Pharmaceutical: Lovenox  40mg  daily -antiplatelet therapy:  ASA and Ticagrelor  X 4 weeks followed by ASA alone 3. Pain Management: Tylenol  prn.   5/22- denies pain 4. Mood/Behavior/Sleep: LCSW to follow for evaluation and support.               -antipsychotic agents: N/A             -Continue lexapro  5mg  daily 5. Neuropsych/cognition: This patient may be intermittently capable of making decisions on his own behalf. 6. Skin/Wound Care: Routine pressure relief measures.  7. Fluids/Electrolytes/Nutrition: Monitor I/O. Monitor labs, continue vitamins/supplements  -12/23/23 hypokalemia as below #16, remainder of labs unremarkable 8. Delirium: Per family has had issues with confusion since yesterday. Low grade fevers are resolving             --Work  up 05/20-->CXR- neg. Procal <0.10  WBC 7.1 --Monitor sleep wake disruption (has not slept for more than 24 hours per wife). Tele sitter ordered for fall prevention. Seroquel 25mg  nightly added to help with sleep.  5/22- T 99.3 this AM, but otherwise no temps in 24 hours- WBC is stable- 6.4- rechecking U/A and Cx- could be due to stroke, however wife per chart, felt it came AFTER stroke- but admitted was having issues at home prior- also per chart. Will get in SLP to tease out what's going on.   5/23- last CXR from 5/20 negative and U/A and UCx 5/22 (-) for UTI  5/26- WBC 6.1  5/29- WBC 6.4 9. HTN: Monitor BP TID--remains labile. Monitor with activity.  --Continue Cozaar  and avoid hypotension due to intracranial atherosclerosis.  5/22-out of permissive HTN timing-  BP running up to 170/99- this AM- losartan  was restarted 25 mg daily- but metoprolol  was not- I don't think Metoprolol  appropriate right now- HR is in 70's off metoprolol - so I think will start Norvasc 2.5 mg daily to help mildly as well- to AVOID HYPOTENSION 5/23- Pt's BP is running 140s-160s but slightly better than yesterday- won't increase meds yet, so doesn't cause hypotension -5/24-25/25 BPs fairly stable/improving, monitor trend given recent med changes 5/26- BP too low this AM- will stop Norvasc 2.5 mg since BP on low side- to avoid hypotension.  5/27- BP running 110s systolic- will monitor and see if needs to reduce BP meds?  Already stopped Norvasc 5/28- BP doing better 120s-140s systolic- con't regimen 5/29- looking good- d/w wife why not using metoprolol - going home on just Flomax  and Losartan - that affect BP. Also asked to check BP if feels unusual or every 2 days at home and make sure controlled.  Vitals:   12/26/23 2046 12/27/23 0532 12/27/23 0928 12/27/23 1438  BP: (!) 141/82 107/74 111/77 113/80   12/27/23 1949 12/28/23 0421 12/28/23 1308 12/28/23 2004  BP: 122/82 139/80 122/81 135/88   12/29/23 0355 12/29/23 1504 12/29/23 1947 12/30/23 0659  BP: (!) 147/94 124/89 131/80 115/80    10. T2DM: Hgb A1C- 6.7. Monitor BS ac/hs and use SSI for elevated BS             --CM restrictions added to diet.   5/22- CBGs running 85-125- will con't to monitor -5/24-25/25 CBGs fine, using very little insulin  in last 24hrs; monitor for now, but may eventually d/c checks given decent A1C-- consider on Monday 5/26- Will decrease CBG checks to BID- [facility] 5/28-5/29 Cbg's look great- con't regimen CBG (last 3)  Recent Labs    12/29/23 1629 12/29/23 2110 12/30/23 0657  GLUCAP 120* 101* 95    11. Hyperlipidemia: LDL @ goal. Continue Lipitor  80 mg 12. Prostate CA: Was on Flomax  0.8 mg till a few months ago.  --Has had hesitancy recently. Hx of retention in the past due to UTI.              --will monitor PVR w/bladder scan. Check UA-- neg 5/23             -Continue flomax , currently 0.4mg  daily -12/25/23 low PVRs, monitor another day but could d/c this as well if continued to improve-- continued to be low 5/25, d/c'd PVRs 5/26- d/c bladder scans 13. Class 1 Obesity: Educated on importance of activity and weight loss Body mass index is 29.23 kg/m. 14. H/o gout: Stable on allopurinol  300mg  daily 15. Mood disorder: On Lexapro  5mg  daily.  16. Hypokalemia  5/22- will  replete K+ for 3.3 K+ level- 40 mEq x1. Will recheck Monday  5/26- K+ 3.5- low but in normal range- will recheck Thursday  5/29- K+ 3.7- doing better 17. GI  ppx: protonix  40mg  daily  I spent a total of 34   minutes on total care today- >50% coordination of care- due to  D/w PA about pt- as well as wife and pt about meds and education on spasticity- that doesn't have at this time  The patient is medically ready for discharge to home and will need follow-up with [facility] PM[facility]. In addition, they will need to follow up with their PCP, Neurology.    LOS: 8 days A FACE TO FACE EVALUATION WAS PERFORMED  Elide Stalzer 12/30/2023, 10:38 AM

## 2023-12-30 NOTE — Progress Notes (Signed)
 Inpatient Rehabilitation Discharge Medication Review by a Pharmacist  A complete drug regimen review was completed for this patient to identify any potential clinically significant medication issues.  High Risk Drug Classes Is patient taking? Indication by Medication  Antipsychotic Yes Quetiapine - mood  Anticoagulant No   Antibiotic No   Opioid No   Antiplatelet Yes Ticagrelor , aspirin  - CVA  Hypoglycemics/insulin  No   Vasoactive Medication Yes Tamsulosin  - BPH Losartan  - BP  Chemotherapy No   Other Yes Amantadine - wakefulness Allopurinol  - gout Atorvastatin  - HLD Escitalopram  - mood Nitroglycerin  prn CP Pantoprazole  - Reflux     Type of Medication Issue Identified Description of Issue Recommendation(s)  Drug Interaction(s) (clinically significant)     Duplicate Therapy     Allergy     No Medication Administration End Date     Incorrect Dose     Additional Drug Therapy Needed     Significant med changes from prior encounter (inform family/care partners about these prior to discharge).    Other       Clinically significant medication issues were identified that warrant physician communication and completion of prescribed/recommended actions by midnight of the next day:  No  Name of provider notified for urgent issues identified:   Provider Method of Notification:     Pharmacist comments: None  Time spent performing this drug regimen review (minutes): 20 minutes  Thank you. Lennice Quivers, PharmD

## 2023-12-31 DIAGNOSIS — R410 Disorientation, unspecified: Secondary | ICD-10-CM | POA: Insufficient documentation

## 2024-01-04 ENCOUNTER — Ambulatory Visit (INDEPENDENT_AMBULATORY_CARE_PROVIDER_SITE_OTHER): Admitting: Family Medicine

## 2024-01-04 ENCOUNTER — Encounter: Payer: Self-pay | Admitting: Family Medicine

## 2024-01-04 VITALS — BP 134/82 | HR 91 | Temp 98.8°F | Ht 70.0 in | Wt 208.4 lb

## 2024-01-04 DIAGNOSIS — Z8673 Personal history of transient ischemic attack (TIA), and cerebral infarction without residual deficits: Secondary | ICD-10-CM | POA: Diagnosis not present

## 2024-01-04 DIAGNOSIS — E119 Type 2 diabetes mellitus without complications: Secondary | ICD-10-CM | POA: Diagnosis not present

## 2024-01-04 DIAGNOSIS — G4700 Insomnia, unspecified: Secondary | ICD-10-CM

## 2024-01-04 LAB — BASIC METABOLIC PANEL WITH GFR
BUN: 18 mg/dL (ref 6–23)
CO2: 28 meq/L (ref 19–32)
Calcium: 9.5 mg/dL (ref 8.4–10.5)
Chloride: 104 meq/L (ref 96–112)
Creatinine, Ser: 1.04 mg/dL (ref 0.40–1.50)
GFR: 71.98 mL/min (ref >60.00)
Glucose, Bld: 149 mg/dL — ABNORMAL HIGH (ref 70–99)
Potassium: 3.9 meq/L (ref 3.5–5.1)
Sodium: 139 meq/L (ref 135–145)

## 2024-01-04 MED ORDER — QUETIAPINE FUMARATE 25 MG PO TABS: 12.5000 mg | ORAL_TABLET | Freq: Every day | ORAL | Status: DC

## 2024-01-04 NOTE — Patient Instructions (Addendum)
 Go to the lab on the way out.   If you have mychart we'll likely use that to update you.    Take care.  Glad to see you. Try tapering seroquel  at night.

## 2024-01-04 NOTE — Progress Notes (Unsigned)
 Inpatient f/u for CVA.   Admit date: 12/22/2023 Discharge date: 12/30/2023   Discharge Diagnoses:  Principal Problem:   Stroke, small vessel (HCC) Active Problems:   Hypertension   Anxiety state   Constipation   Diabetes mellitus without complication (HCC)   Delirium  Pre-renal azotemia  Hypotension  Sleep wake disruption  Hypokalemia   Discharged Condition: stable   Significant Diagnostic Studies: N/A      Brief HPI:   Tyler Guerra. is a 73 y.o. 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  He was noted to be febrile in ED and tach lactic acidosis.  He was started on IV fluids and empiric antibiotics.  CTA head done showing focal occlusion versus severe near occlusive stenosis right anterior cerebral artery.  CT perfusion showed no core infarct.  MRI brain showed 3 mm infarct right periatrial white matter with background parenchymal atrophy.  EEG was negative for seizures and was suggestive of encephalopathy.     Dr. Janett Medin felt that stroke was due to small vessel disease and recommended continuing aspirin  and Brilinta  x 30 days followed by aspirin  alone.  Loop recorder was interrogated and was negative for A-fib.  Antibiotics were discontinued on 05/20 as workup negative.  He continued to have issues with confusion and chest x-ray and CBC done which was negative for signs of infection.  His right intention was improving but he continued to be limited by weakness and cognitive deficits with delay in processing.  He was requiring min assist overall.  Therapy was working with him and CIR was recommended due to functional decline.     Hospital Course: [name]. was admitted to rehab 12/22/2023 for inpatient therapies to consist of PT, ST and OT at  least three hours five days a week. Past admission physiatrist, therapy team and rehab RN have worked together to provide customized collaborative inpatient rehab.  Patient was maintained on DAPT during his stay and has tolerated this without side effects.  Follow-up CBC showed H&H and white count to be stable.  He continued to have issues with delirium as well as sleep-wake disruption.  Low-dose Seroquel  was added and titrated upwards to help with sleep hygiene.  His blood pressures were monitored on TID basis and noted to be trending downward with increase in activity therefore metoprolol  not resumed, amlodipine  was discontinued and Cozaar  titrated down.   Patient with history of urinary retention in the past and reported issues with hesitancy.  PVRs were checked showing low volumes therefore this was discontinued.  Mood has been stable on home dose Lexapro .  His wife has been very supportive and team has provided ego support and encouragement during his stay. His blood sugars were monitored with Achs  basis and was stable on diet alone and CBG checks were discontinued 5/24.  Bowel program was augmented to help manage constipation. Serial check of lytes showed transient hypokalemia which has improved with brief supplementation. Mentation has greatly improved and he has made steady gains. His Berg Balance Score is at 48/56 and supervision is recommended at discharge. He will continue to receive follow up outpatient PT, OT and ST at [facility] after discharge.      Rehab course: During patient's stay in rehab weekly team conferences were held  to monitor patient's progress, set goals and discuss barriers to discharge. At admission, patient required contact-guard assist with ADL tasks and min assist with mobility. He was tolerating regular diet without any signs or symptoms of aspiration.  He evaluated severe deficits in attention, recall, problem-solving as well as sustaining attention to task. He  has had  improvement in activity tolerance, balance, postural control as well as ability to compensate for deficits.  He requires supervision with all BADLs and min verbal cues for initiation. He requires supervision for transfers and is able to ambulate 500' without use of AD. He is able to climb 12 stairs with use of 2 rails and supervision. He has had improvement in recall, problem solving, attention and awareness. He requires min to mod assist with cognitive tasks.  Family education has been completed.      Discharge disposition: 01-Home or Self Care   Diet: Heart Healthy. Carb Modified.    Special Instructions: Family to assist with medication management as well as cognitive tasks. No driving or strenuous activity. Increase fluid intake.  Recommend recheck BMET  in 1 to 2 weeks on posthospital follow-up.   =========================== Inpatient course discussed with patient.  He had HA, fever, gait changes, confusion.  Admitted with  3 mm acute infarct within the right periatrial white matter. He is improved compared to admission w/o acute changes in the meantime.  He improved enough to where he could be discharged back to home.  Rationale for current medications discussed with patient.  No new events since discharge.  Discussed insomnia.  He is going to try to stop taking seroquel  at night.  Has used until now.  Will taper with 1/2 tab at night.  He has neurology follow-up pending.  Diabetes history noted.  Last A1c controlled.  Meds, vitals, and allergies reviewed.   ROS: Per HPI unless specifically indicated in ROS section   Nad Ncat Neck supple, no LA Rrr Ctab Abdomen soft.  Nontender. Skin well-perfused.   S/s grossly wnl x4 Smile symmetric.   EOMI B Sensation wnl face B  speech at baseline.

## 2024-01-05 ENCOUNTER — Ambulatory Visit: Payer: Self-pay | Admitting: Family Medicine

## 2024-01-05 DIAGNOSIS — G4700 Insomnia, unspecified: Secondary | ICD-10-CM | POA: Insufficient documentation

## 2024-01-05 NOTE — Assessment & Plan Note (Signed)
 Discussed rationale for current medications.  Continue aspirin  and Brilinta  for now.  After 30 days of Brilinta  he will taper to just aspirin .  See notes on follow-up labs.  Continue losartan  Lexapro  atorvastatin .  Fortunately no new events in the meantime.  His mentation is back to baseline.  He has support at home.  He has neurology follow-up pending.  Discussed tapering Seroquel  for insomnia.  He can update me as needed.  He agrees with plan.

## 2024-01-05 NOTE — Assessment & Plan Note (Signed)
 Noted, with vascular history noted.  A1c is still controlled.  Already on statin.  No change in medications.  Recheck periodically.

## 2024-01-05 NOTE — Assessment & Plan Note (Signed)
 Discussed tapering Seroquel  as tolerated.  Can update me as needed.

## 2024-01-06 ENCOUNTER — Ambulatory Visit (INDEPENDENT_AMBULATORY_CARE_PROVIDER_SITE_OTHER)

## 2024-01-06 ENCOUNTER — Ambulatory Visit: Payer: Self-pay | Admitting: Internal Medicine

## 2024-01-06 DIAGNOSIS — G459 Transient cerebral ischemic attack, unspecified: Secondary | ICD-10-CM | POA: Diagnosis not present

## 2024-01-06 LAB — CUP PACEART REMOTE DEVICE CHECK
Date Time Interrogation Session: 20250604232420
Implantable Pulse Generator Implant Date: 20240521

## 2024-01-12 NOTE — Therapy (Signed)
 OUTPATIENT PHYSICAL THERAPY NEURO EVALUATION   Patient Name: Tyler Guerra. MRN: [id number] DOB:[birth year], 73 y.o., male 87 Date: 01/13/2024   PCP: Donnie Galea, MD  REFERRING PROVIDER: Jairo Mayer    END OF SESSION:  PT End of Session - 01/13/24 1107     Visit Number 1    Number of Visits 5    Date for PT Re-Evaluation 03/13/24   due to potential delay in scheduling   PT Start Time 1105    PT Stop Time 1145    PT Time Calculation (min) 40 min    Equipment Utilized During Treatment Gait belt    Activity Tolerance Patient tolerated treatment well    Behavior During Therapy [facility] for tasks assessed/performed          Past Medical History:  Diagnosis Date   Anxiety state 03/16/2016   Aortic stenosis    Mild to moderate by echo 12/2023 with mean aortic valve gradient 12 mmHg   Aphasia 01/26/2017   Benign localized prostatic hyperplasia with lower urinary tract symptoms (LUTS)    BPV (benign positional vertigo) 11/02/2014   Carotid artery stenosis    PER DUPLEX 10-22-2015  BILATERAL ICA 1-39%   Cataract    Coronary artery disease    CABG 2000 with LIMA> LAD, SVG > RCA and free radial > OM. 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   Gout    Gout 07/18/2010   Qualifier: Diagnosis of  By: Vallarie Gauze MD, Tyrone Gallop     H/O eye injury    chronic changes to left eye after injury   Hand weakness 03/02/2018   Hearing loss 04/21/2013   History of TIA (transient ischemic attack)    03/ 2017  no residual after brief episode loss peripheral vision   HOH (hard of hearing)    Hyperglycemia 05/17/2016   Hyperlipemia    Hyperlipidemia 07/18/2010   Qualifier: Diagnosis  of  By: Vallarie Gauze MD, Graham     Hypertension    HYPERTENSION, BENIGN ESSENTIAL 07/18/2010   Qualifier: Diagnosis of  By: Vallarie Gauze MD, Graham     Hypokalemia 01/26/2017   Knee pain 03/13/2019   Left shoulder pain 09/04/2018   Pneumothorax 10/11/2019   RIGHT    Prostate cancer (HCC) UROLOGIST-  DR GRAPEY/  ONCOLOGIST-  DR MANNING   dx 2015--- Stage T1c, Gleason 3+4, PSA 4.03, vol 44cc   PVC's (premature ventricular contractions)    Rash and nonspecific skin eruption 10/14/2015   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   S/P CABG x 05 Dec 1998   Skin lesion 10/14/2015   Stroke [facility])    TIA (transient ischemic attack) 10/14/2015   Ventral hernia 10/26/2011   Wears glasses    Past Surgical History:  Procedure Laterality Date   APPENDECTOMY  1975   CARDIAC CATHETERIZATION  12-22-2001   dr Kay Parson   severe native vessel disease mLAD 60-70%,  total occulsion pCFX  and pRCA/  widely patent saphenous vein , free radial , and LIMA grafts/  minminal lv dysfunction, ef 60%   CHEST TUBE INSERTION Right 10/11/2019   CORONARY ARTERY BYPASS GRAFT  12/1998   LIMA  to LAD,  SVG to PDA and Diagonal, Free radial graft to OM   CORONARY STENT INTERVENTION N/A 05/26/2021   Procedure: CORONARY STENT INTERVENTION;  Surgeon: Kyra Phy, MD;  Location: [facility] INVASIVE CV LAB;  Service: Cardiovascular;  Laterality: N/A;   Exericse treadmill test  last one 01-03-2014  dr Kay Parson   normal exercise tolerance w/ hypertensive repsonse,  no ischemic EKG changes, appropriate HR response & recovery ([facility] TM score 9;  Low Risk , PVC's w/ exertion)   EYE SURGERY     FRACTURE SURGERY     LEFT HEART CATH AND CORS/GRAFTS ANGIOGRAPHY N/A 05/26/2021   Procedure: LEFT HEART CATH AND CORS/GRAFTS ANGIOGRAPHY;  Surgeon: Kyra Phy, MD;  Location: [facility] INVASIVE CV [facility];  Service: Cardiovascular;  Laterality: N/A;   LOOP RECORDER INSERTION N/A 12/22/2022   Procedure: LOOP RECORDER INSERTION;  Surgeon:  Tammie Fall, MD;  Location: [facility] IN[facility] CV [facility];  Service: Cardiovascular;  Laterality: N/A;   PROSTATE BIOPSY     RADIOACTIVE SEED IMPLANT N/A 01/13/2016   Procedure: RADIOACTIVE SEED IMPLANT/BRACHYTHERAPY IMPLANT;  Surgeon: Ann Barnacle, MD;  Location: [facility];  Service: Urology;  Laterality: N/A;   Patient Active Problem List   Diagnosis Date Noted   Insomnia 01/05/2024   Stroke, 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   Diabetes mellitus without complication (HCC) 12/05/2023   Health care maintenance 12/01/2023   Acute CVA (cerebrovascular accident) (HCC) 12/22/2022   History of CVA (cerebrovascular accident) 12/22/2022   Constipation 03/04/2022   CAD (coronary artery disease) 05/24/2021   NSTEMI (non-ST elevated myocardial infarction) (HCC) 05/24/2021   Pneumothorax 10/11/2019   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   Knee joint symptom 03/13/2019   Left shoulder pain 09/04/2018   Anxiety state 03/16/2016   Pseudophakia of both eyes 02/24/2016   TIA (transient ischemic attack) 10/14/2015   BPV (benign positional vertigo) 11/02/2014   Advance care planning 05/13/2014   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   Hearing loss 04/21/2013   Ventral hernia 10/26/2011   Medicare annual wellness visit, subsequent 10/26/2011   Prostate cancer (HCC) 10/26/2011   Coronary atherosclerosis 07/19/2010   GERD 07/19/2010   Hyperlipidemia 07/18/2010   Gout 07/18/2010   Hypertension 07/18/2010    ONSET DATE: 12/28/2023  REFERRING DIAG: I63.9 (ICD-10-CM) - Acute CVA (cerebrovascular accident) (HCC)   THERAPY DIAG:  Unsteadiness on feet  Muscle weakness (generalized)  Rationale for Evaluation and Treatment: Rehabilitation  SUBJECTIVE:  SUBJECTIVE STATEMENT: Was discharged home 2 weeks  ago from inpatient rehab. Had new CVA, mainly differences in cognition. Feels like the balance is still a little iffy. Notes heel toe is still challenging. Standing on one leg is pretty tough. Walks out to [employer] daily and still takes the garbage out. Uses the railing when going up and down the stairs. No falls. Has an exercise bike upstairs and did 10 minutes the other day. Worked on floor transfers in the hospital.   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 CT perfusion showed no core infarct. MRI brain showed 3 mm infarct right periatrial white matter with background parenchymal atrophy. EEG was negative for seizures and was suggestive of encephalopathy   Received inpatient rehab and was discharged 12/30/23  PAIN:  Are you having pain? No  Vitals:   01/13/24 1121  BP: 128/74  Pulse: 84     PRECAUTIONS: Fall  FALLS: Has patient fallen in last 6 months? No  LIVING ENVIRONMENT: Lives with: lives with their spouse and family close by Lives in: House/apartment Stairs: Yes: Internal: 12 steps; on right going up, level entry into house  Has following equipment at home: Single point cane, Walker - 2 wheeled, and reports needs to put some   PLOF: Independent  PATIENT GOALS: Wants to get back his normal balance, just wants any improvement  OBJECTIVE:  Note: Objective measures were completed at Evaluation unless otherwise noted.  DIAGNOSTIC FINDINGS: MRI brain 12/19/23 IMPRESSION: 1. 3 mm acute infarct within the right periatrial white matter. 2. Background parenchymal atrophy, chronic small vessel  disease and redemonstrated chronic infarcts as described.  COGNITION: Overall cognitive status: Impaired   SENSATION: WFL Pt denies numbness/tingling in BLE   COORDINATION: Heel to shin: WNL bilat    POSTURE: rounded shoulders and forward head   LOWER EXTREMITY MMT:    MMT Right Eval Left Eval  Hip flexion 5 5  Hip extension    Hip abduction    Hip adduction    Hip internal rotation    Hip external rotation    Knee flexion 5 5  Knee extension 5 5  Ankle dorsiflexion 5 4+  Ankle plantarflexion    Ankle inversion    Ankle eversion    (Blank rows = not tested)  BED MOBILITY:  Pt denies difficulties   TRANSFERS: Sit to stand: Complete Independence  Assistive device utilized: None     Stand to sit: Complete Independence  Assistive device utilized: None      Performs with no UE support   STAIRS:  Level of Assistance: SBA  Stair Negotiation Technique: Alternating Pattern  Forwards with Single Rail on Right  Number of Stairs: 4   Height of Stairs: 6  Comments: when trying to descend without a railing, pt performs with step to pattern    GAIT: Gait pattern: step through pattern and decreased stride length Distance walked: Clinic distances  Assistive device utilized: None Level of assistance: Modified independence Slightly bilateral external rotation noted during gait    FUNCTIONAL TESTS:  5 times sit to stand: 11.5 seconds no UE support  30 seconds chair stand test: 12  sit <> stands with no UE support (<12 below normal for age) 10 meter walk test: 11.6 seconds no AD = 2.83 ft/sec   SLS: RLE: 7 seconds, LLE: 1-2 seconds      [facility] PT Assessment - 01/13/24 1127       Functional Gait  Assessment   Gait assessed  Yes    Gait Level Surface Walks 20 ft in less than 5.5 sec, no assistive devices, good speed, no evidence for imbalance, normal gait pattern, deviates no more than 6 in outside of the 12 in walkway width.   5.5   Change in Gait Speed Able to  smoothly change walking speed without loss of balance or gait deviation. Deviate no more than 6 in outside of the 12 in walkway width.    Gait with Horizontal Head Turns Performs head turns smoothly with no change in gait. Deviates no more than 6 in outside 12 in walkway width    Gait with Vertical Head Turns Performs head turns with no change in gait. Deviates no more than 6 in outside 12 in walkway width.    Gait and Pivot Turn Pivot turns safely within 3 sec and stops quickly with no loss of balance.    Step Over Obstacle Is able to step over 2 stacked shoe boxes taped together (9 in total height) without changing gait speed. No evidence of imbalance.    Gait with Narrow Base of Support Ambulates 7-9 steps.    Gait with Eyes Closed Walks 20 ft, slow speed, abnormal gait pattern, evidence for imbalance, deviates 10-15 in outside 12 in walkway width. Requires more than 9 sec to ambulate 20 ft.   11.3   Ambulating Backwards Walks 20 ft, uses assistive device, slower speed, mild gait deviations, deviates 6-10 in outside 12 in walkway width.   15.4 seconds   Steps Alternating feet, must use rail.    Total Score 25    FGA comment: 23/30 = Low Fall Risk                                                                                                                                       TREATMENT DATE:  N/A during eval     PATIENT EDUCATION: Education details: Clinical findings, POC  Person educated: Patient Education method: Explanation Education comprehension: verbalized understanding  HOME EXERCISE PROGRAM: Will review from previous bout of therapy and update as appropriate   GOALS: Goals reviewed with patient? Yes  SHORT TERM GOALS: ALL STGS = LTGS   LONG TERM GOALS: Target date: 02/10/2024  Pt will be independent with final HEP for strength/balance in order to build upon functional gains made in therapy. Baseline:  Goal status: INITIAL  2.  Pt will improve FGA to at least a  28/30 in order to demo decr fall risk.  Baseline: 25/30 Goal status: INITIAL  3.  Pt will improve 30 second  chair stand to 14 in order to demo improved functional strength/endurance  Baseline: 12 sit <> stands Goal status: INITIAL  4.  Pt will improve SLS consistently on LLE to at least 5 seconds for improved stability for balance Baseline: 1-2 seconds  Goal status: INITIAL    ASSESSMENT:  CLINICAL IMPRESSION: Patient is a 73 year old male referred to Neuro [facility] for CVA.  Pt was admitted to [facility] on 12/19/23 with headache, left-sided weakness with left gaze preference, mental status changes and difficulty following commands. MRI brain showed 3 mm infarct right periatrial white matter with background parenchymal atrophy.  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 The following deficits were present during the exam: impaired balance, mild LLE weakness, gait abnormalities. Based on FGA, pt is a low risk for falls. Pt with decr SLS time LLE>RLE. Pt would benefit from skilled PT to address these impairments and functional limitations to maximize functional mobility independence and improve high level balance.    OBJECTIVE IMPAIRMENTS: Abnormal gait, decreased activity tolerance, decreased balance, decreased cognition, decreased strength, and postural dysfunction.   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   REHAB POTENTIAL: Good  CLINICAL DECISION MAKING: Stable/uncomplicated  EVALUATION COMPLEXITY: Low  PLAN:  PT FREQUENCY:  1x/week  PT DURATION: 8 weeks - due to potential delay in scheduling - only anticipate 4 weeks   PLANNED INTERVENTIONS: [id number]- PT Re-evaluation, 97110-Therapeutic exercises, 97530- Therapeutic activity, 97112- Neuromuscular re-education, 97535- Self Care, 16109- Manual therapy, [phone]- Gait training, Patient/Family education, Balance training, Stair training, and Vestibular training  PLAN FOR NEXT SESSION: HEP for SLS, high level balance, EC, unlevel surfaces. Work on high level balance in clinic and with cog dual tasking    Seabron Cypress, PT, DPT 01/13/2024, 12:21 PM

## 2024-01-12 NOTE — Therapy (Signed)
 OUTPATIENT OCCUPATIONAL THERAPY NEURO EVALUATION  Patient Name: Tyler Guerra. MRN: [id number] DOB:06-24-1951, 73 y.o., male 36 Date: 01/13/2024  PCP: Donnie Galea, MD REFERRING PROVIDER: Zelda Hickman, PA-C  END OF SESSION:  OT End of Session - 01/13/24 1114     Visit Number 1    Number of Visits 1    Authorization Type MCR, BC/BS    OT Start Time 1015    OT Stop Time 1100    OT Time Calculation (min) 45 min    Activity Tolerance Patient tolerated treatment well    Behavior During Therapy [facility] for tasks assessed/performed          Past Medical History:  Diagnosis Date   Anxiety state 03/16/2016   Aortic stenosis    Mild to moderate by echo 12/2023 with mean aortic valve gradient 12 mmHg   Aphasia 01/26/2017   Benign localized prostatic hyperplasia with lower urinary tract symptoms (LUTS)    BPV (benign positional vertigo) 11/02/2014   Carotid artery stenosis    PER DUPLEX 10-22-2015  BILATERAL ICA 1-39%   Cataract    Coronary artery disease    CABG 2000 with LIMA> LAD, SVG > RCA and free radial > OM. 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   Gout    Gout 07/18/2010   Qualifier: Diagnosis of  By: Vallarie Gauze MD, Tyrone Gallop     H/O eye injury    chronic changes to left eye after injury   Hand weakness 03/02/2018   Hearing loss 04/21/2013   History of TIA (transient ischemic attack)    03/ 2017  no residual after brief episode loss peripheral vision   HOH (hard of hearing)    Hyperglycemia 05/17/2016   Hyperlipemia    Hyperlipidemia 07/18/2010   Qualifier: Diagnosis of  By: Vallarie Gauze MD, Graham     Hypertension    HYPERTENSION, BENIGN ESSENTIAL 07/18/2010    Qualifier: Diagnosis of  By: Vallarie Gauze MD, Graham     Hypokalemia 01/26/2017   Knee pain 03/13/2019   Left shoulder pain 09/04/2018   Pneumothorax 10/11/2019   RIGHT    Prostate cancer (HCC) UROLOGIST-  DR GRAPEY/  ONCOLOGIST-  DR MANNING   dx 2015--- Stage T1c, Gleason 3+4, PSA 4.03, vol 44cc   PVC's (premature ventricular contractions)    Rash and nonspecific skin eruption 10/14/2015   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   S/P CABG x 05 Dec 1998   Skin lesion 10/14/2015   Stroke [facility])    TIA (transient ischemic attack) 10/14/2015   Ventral hernia 10/26/2011   Wears glasses    Past Surgical History:  Procedure Laterality Date   APPENDECTOMY  1975   CARDIAC CATHETERIZATION  12-22-2001   dr Kay Parson   severe native vessel disease mLAD 60-70%,  total occulsion pCFX  and pRCA/  widely patent saphenous vein , free radial , and LIMA grafts/  minminal lv dysfunction, ef 60%   CHEST TUBE INSERTION Right 10/11/2019   CORONARY ARTERY BYPASS GRAFT  12/1998   LIMA to LAD,  SVG to PDA and Diagonal, Free radial graft to OM   CORONARY STENT INTERVENTION N/A 05/26/2021  Procedure: CORONARY STENT INTERVENTION;  Surgeon: Thukkani, Arun K, MD;  Location: [facility] INVASIVE CV LAB;  Service: Cardiovascular;  Laterality: N/A;   Exericse treadmill test  last one 01-03-2014  dr Kay Parson   normal exercise tolerance w/ hypertensive repsonse,  no ischemic EKG changes, appropriate HR response & recovery ([facility] TM score 9;  Low Risk , PVC's w/ exertion)   EYE SURGERY     FRACTURE SURGERY     LEFT HEART CATH AND CORS/GRAFTS ANGIOGRAPHY N/A 05/26/2021   Procedure: LEFT HEART CATH AND CORS/GRAFTS ANGIOGRAPHY;  Surgeon: Kyra Phy, MD;  Location: [facility] INVASIVE CV LAB;  Service: Cardiovascular;  Laterality: N/A;   LOOP RECORDER INSERTION N/A 12/22/2022   Procedure: LOOP RECORDER INSERTION;  Surgeon: Tammie Fall, MD;  Location: [facility] INVASIVE CV LAB;  Service: Cardiovascular;  Laterality: N/A;    PROSTATE BIOPSY     RADIOACTIVE SEED IMPLANT N/A 01/13/2016   Procedure: RADIOACTIVE SEED IMPLANT/BRACHYTHERAPY IMPLANT;  Surgeon: Ann Barnacle, MD;  Location[facility];  Service: Urology;  Laterality: N/A;   Patient Active Problem List   Diagnosis Date Noted   Insomnia 01/05/2024   Stroke, 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   Diabetes mellitus without complication (HCC) 12/05/2023   Health care maintenance 12/01/2023   Acute CVA (cerebrovascular accident) (HCC) 12/22/2022   History of CVA (cerebrovascular accident) 12/22/2022   Constipation 03/04/2022   CAD (coronary artery disease) 05/24/2021   NSTEMI (non-ST elevated myocardial infarction) (HCC) 05/24/2021   Pneumothorax 10/11/2019   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   Knee joint symptom 03/13/2019   Left shoulder pain 09/04/2018   Anxiety state 03/16/2016   Pseudophakia of both eyes 02/24/2016   TIA (transient ischemic attack) 10/14/2015   BPV (benign positional vertigo) 11/02/2014   Advance care planning 05/13/2014   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   Hearing loss 04/21/2013   Ventral hernia 10/26/2011   Medicare annual wellness visit, subsequent 10/26/2011   Prostate cancer (HCC) 10/26/2011   Coronary atherosclerosis 07/19/2010   GERD 07/19/2010   Hyperlipidemia 07/18/2010   Gout 07/18/2010   Hypertension 07/18/2010    ONSET DATE: 12/28/2023 (referral date)   REFERRING DIAG: I63.9 (ICD-10-CM) - Acute CVA (cerebrovascular accident) (HCC)  THERAPY DIAG:  Other symptoms and signs involving cognitive functions following cerebral infarction  Muscle weakness (generalized)  Rationale for Evaluation and Treatment: Rehabilitation  SUBJECTIVE:   SUBJECTIVE STATEMENT: Pt's wife reports he is pretty much back to where he was from the first stroke in 2024 w/ mild residual Lt hemiparesis  Pt accompanied by: spouse Trevor Fudge)   PERTINENT HISTORY:  admitted 12/19/23 with left-sided weakness and  right gaze preference, migraine. MRI revealed acute infarct within the right periatrial white matter. PMH includes hypertension, hyperlipidemia, TIA, prostate cancer, right ACA stroke in 5/24, gout, CAD, TIA, anxiety, BPPV, CABG and stents, HOH (L better than R)  PRECAUTIONS: Other: loop recorder, no driving right now  WEIGHT BEARING RESTRICTIONS: No  PAIN:  Are you having pain? No  FALLS: Has patient fallen in last 6 months? No  LIVING ENVIRONMENT: Lives with: lives with their spouse Lives in: 2 story home, level entry (daughter lives next door)  Has following equipment at home: Single point cane, Environmental consultant - 2 wheeled, and shower chair  PLOF: Independent with basic ADLs and Vocation/Vocational requirements: retired  PATIENT GOALS: UNSURE  OBJECTIVE:  Note: Objective measures were completed at Evaluation unless otherwise noted.  HAND DOMINANCE: Right  ADLs: Eating: mod I  Grooming: mod I  w/ electric toothbrush and shaver UB Dressing: independent  LB Dressing: mod I w/ extra time Toileting: mod I  Bathing: mod I  Tub Shower transfers: mod I  Equipment: Shower seat with back  IADLs: Shopping: goes with wife Light housekeeping: pt takes out trash, wife has always done cleaning Meal Prep: wife always did Community mobility: pt was driving prior to 2nd stroke but was still minimal. Pt has not driven since 2nd stroke Medication management: wife manages his medication (even before the first stroke) Financial management: wife has always done Handwriting: 90% legible  MOBILITY STATUS: Independent   UPPER EXTREMITY ROM:  BUE AROM WFL's   UPPER EXTREMITY MMT:  BUE MMT grossly 5/5, except Lt shoulder flex 4+/5    HAND FUNCTION: Grip strength: Right: 110.6 lbs; Left: 87.5 lbs  COORDINATION: 9 Hole Peg test: Right: 30.28 sec; Left: 29.07 sec  SENSATION: WFL  EDEMA: none   COGNITION: Overall cognitive status: Impaired and reports memory changes, however does not  effect day to day activities. 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 ([place]) and decreased peripheral vision but no changes from either stroke Baseline vision: Wears glasses all the time Visual history: see subjective  PERCEPTION: Not tested  PRAXIS: Not tested  OBSERVATIONS: Pt with very mild Lt hemiparesis (from first stroke in 2024) and some memory changes/cognitive changes from recent stroke but improving and not currently impacting day to day activities                                                                                                                             TREATMENT DATE: 01/13/24    Pt/wife issued memory strategies and reviewed extensively. Encouraged pt/wife to let him write things down instead of wife. Also encouraged pt's wife to only provide questioning cues to remind patient of things (medications, appts, etc)  Also encouraged pt to play card games and board games to improve cognition and memory  Pt also issued putty HEP for Lt grip strength (only mildly impaired, but still very functional) w/ green resistance putty and reviewed - see pt instructions for details.   Pt also encouraged to use LUE for safe tasks (putting away dishes/groceries, opening cabinets/drawers, etc) as pt tends to use more dominant RUE for everything.      PATIENT EDUCATION: Education details: see above Person educated: Patient and Spouse Education method: Explanation, Demonstration, Verbal cues, and Handouts Education comprehension: verbalized understanding  HOME EXERCISE PROGRAM: 01/13/24: memory strategies and putty HEP    GOALS: Goals reviewed with patient? N/A   ASSESSMENT:  CLINICAL IMPRESSION: Patient is a 73 y.o. male who was seen today for occupational therapy evaluation s/p CVA 12/2023. Pt also had stroke in May 2024 with mild residual Lt hemiparesis. Pt  presents today with mild LUE weakness (premorbid from 2nd stroke), decreased memory and slightly decreased use/attn  to Lt side. O.T. addressed these deficits during evaluation and does not need further skilled O.T. at this time. Memory/cognitive deficits do not impact daily activities. Speech to further assess in detail prn next week.   PERFORMANCE DEFICITS: in functional skills including strength and UE functional use, cognitive skills including memory, problem solving, and safety awareness.  CO-MORBIDITIES: may have co-morbidities  that affects occupational performance. Patient will benefit from skilled OT to address above impairments and improve overall function.  MODIFICATION OR ASSISTANCE TO COMPLETE EVALUATION: No modification of tasks or assist necessary to complete an evaluation.  OT OCCUPATIONAL PROFILE AND HISTORY: Problem focused assessment: Including review of records relating to presenting problem.  CLINICAL DECISION MAKING: LOW - limited treatment options, no task modification necessary  EVALUATION COMPLEXITY: Low    PLAN:  OT FREQUENCY: one time visit   RECOMMENDED OTHER SERVICES: none - pt scheduled for P.T. and speech evaluations   PLAN FOR NEXT SESSION: N/A - One time visit   Velinda Getting, OT 01/13/2024, 11:14 AM

## 2024-01-13 ENCOUNTER — Encounter: Payer: Self-pay | Admitting: Physical Therapy

## 2024-01-13 ENCOUNTER — Ambulatory Visit: Admitting: Occupational Therapy

## 2024-01-13 ENCOUNTER — Encounter: Payer: Self-pay | Admitting: Occupational Therapy

## 2024-01-13 ENCOUNTER — Ambulatory Visit: Attending: Physical Medicine and Rehabilitation | Admitting: Physical Therapy

## 2024-01-13 VITALS — BP 128/74 | HR 84

## 2024-01-13 DIAGNOSIS — R41841 Cognitive communication deficit: Secondary | ICD-10-CM | POA: Insufficient documentation

## 2024-01-13 DIAGNOSIS — M6281 Muscle weakness (generalized): Secondary | ICD-10-CM

## 2024-01-13 DIAGNOSIS — I69318 Other symptoms and signs involving cognitive functions following cerebral infarction: Secondary | ICD-10-CM | POA: Insufficient documentation

## 2024-01-13 DIAGNOSIS — R2681 Unsteadiness on feet: Secondary | ICD-10-CM | POA: Diagnosis not present

## 2024-01-13 DIAGNOSIS — R2689 Other abnormalities of gait and mobility: Secondary | ICD-10-CM | POA: Diagnosis not present

## 2024-01-13 DIAGNOSIS — I639 Cerebral infarction, unspecified: Secondary | ICD-10-CM | POA: Insufficient documentation

## 2024-01-13 NOTE — Patient Instructions (Addendum)
 Memory Compensation Strategies  Use "WARM" strategy. W= write it down A=  associate it R=  repeat it M=  make a mental picture  You can keep a Memory Notebook. Use a 3-ring notebook with sections for the following:  calendar, important names and phone numbers, medications, doctors' names/phone numbers, "to do list"/reminders, and a section to journal what you did each day  Use a calendar to write appointments down.  Write yourself a schedule for the day.  This can be placed on the calendar or in a separate section of the Memory Notebook.  Keeping a regular schedule can help memory.  Use medication organizer with sections for each day or morning/evening pills  You may need help loading it  Keep a basket, or pegboard by the door.   Place items that you need to take out with you in the basket or on the pegboard.  You may also want to include a message board for reminders.  Use sticky notes. Place sticky notes with reminders in a place where the task is performed.  For example:  "turn off the stove" placed by the stove, "lock the door" placed on the door at eye level, "take your medications" on the bathroom mirror or by the place where you normally take your medications  Use alarms, timers, and/or a reminder app. Use while cooking to remind yourself to check on food or as a reminder to take your medicine, or as a reminder to make a call, or as a reminder to perform another task, etc.  Use a voice recorder app or small tape recorder to record important information and notes for yourself. Go back at the end of the day and listen to these.    1. Grip Strengthening (Resistive Putty)   Squeeze putty using thumb and all fingers. Repeat _20___ times. Do __2__ sessions per day.   2. Roll putty into tube on table and pinch between first two fingers and thumb x 10 reps. Do 2 sessions per day

## 2024-01-14 NOTE — Progress Notes (Unsigned)
 Subjective:    Patient ID: Tyler Galloway., male    DOB: 20-Feb-1951, 73 y.o.   MRN: [id number]  HPI: Tyler Voiles. is a 73 y.o. male who is scheduled for HU appointment for follow up of his  Acute CVA, Essential Hypertension and Hyperlipidemia.  He was brought to [facility] ED via EMS  with complaints of altered mental status and left sided weakness.   Dr. Oris Birmingham H&P Note Chief Complaint: Altered mental status   HPI: Tyler Doig. is a 73 y.o. 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  Wife and daughter are at bedside and provides most of the history.  Wife states yesterday evening around 5 PM patient complained of headache and noted to have low-grade temp of 99.5.  He apparently had called wife who had gone out of the house and when no response tried calling his daughters.  When wife arrived home he seemed somewhat upset that he could not reach them but at the same time somewhat confused. Wife noted low-grade temp and gave him Motrin to help with the headache as well subsequent to which patient went to bed.   Patient apparently also had an episode of incontinence and wife suspected if patient had any UTI.  Patient's wife checked on him again this morning when he appeared to be warm to touch and his temp was 101F.  Wife tried to help patient out of bed but he was too weak to get up.  At this point wife called his daughters who live next-door to help patient out of the bed to chair.  Patient apparently was pulling on them and was having hard time following directions.  He appeared confused and family called EMS.  On EMS arrival patient noted to have left-sided neglect and some left-sided weakness.  One of the daughters apparently noted mild left facial  asymmetry although now at baseline.  Patient also noted to be drooling from the left side of his mouth when being transported according to the family.  Patient in the ED appears to have left-sided neglect and right side preference.  He is able to speak clearly but has difficulty following commands.  He is moving all his extremities spontaneously.  CT Head: WO Contrast:  IMPRESSION: 1.  No acute intracranial finding. 2. Parenchymal atrophy, chronic small vessel ischemic disease and chronic infarcts, as described.  CTA: Head and Neck:  IMPRESSION: CTA neck:   1. The common carotid and internal carotid arteries are patent within the neck without stenosis. Mild atherosclerotic plaque bilaterally, as described. 2. The vertebral arteries are patent within the neck without stenosis or significant atherosclerotic disease. 3. Aortic Atherosclerosis (ICD10-I70.0).   CTA head:   1. Focal occlusion versus severe, near-occlusive stenosis of the right anterior cerebral artery A3 segment. It is difficult to determine if this was present on the prior CTA of 12/22/2022 (due to the degree of motion degradation on the prior exam). Immediately downstream from this, additional severe stenosis within the right anterior cerebral artery A3 segment. 2. Background intracranial atherosclerotic disease. Most notably, calcified plaque results in up to moderate stenosis of the paraclinoid right internal carotid artery.   CT perfusion head:   Poor source data quality limits reliability of the perfusion values. Within this limitation,  the perfusion software identifies no core infarct. The perfusion software identifies no critically hypoperfused parenchyma (utilizing the Tmax>6 seconds threshold). No mismatch volume reported.   Neurology was consulted: Dr Janett Medin recommended continuing ASA and Brilinta  x 30 days then aspirin  alone.   Tyler Guerra was admitted to inpatient rehabilitation on 12/22/2023 and  discharged home on 12/30/2023. He is scheduled for outpatient therapy with [facility]. He denies any pain. He rates his pain 0.   Also reports he has a good appetite.   Pain Inventory Average Pain 0 Pain Right Now 0 My pain is no pain  LOCATION OF PAIN  No pain reported  BOWEL Number of stools per week: 4 Oral laxative use Yes  Type of laxative Fibercon  Enema or suppository use No  History of colostomy No  Incontinent No   BLADDER Normal (taking Tamsulosin      Mobility walk without assistance how many minutes can you walk? unknown ability to climb steps?  yes do you drive?  no Do you have any goals in this area?  yes  Function retired Do you have any goals in this area?  yes  Neuro/Psych No problems in this area  Prior Studies Any changes since last visit?  no  Physicians involved in your care Any changes since last visit?  no   Family History  Problem Relation Age of Onset   Dementia Mother    Heart disease Mother    Cancer Father        prostate in eighties   Prostate cancer Father    Heart disease Father    Stroke Father    Cancer Sister        breast   Anxiety disorder Sister    Colon cancer Maternal Grandfather    Cancer Paternal Uncle        prostate   Social History   Socioeconomic History   Marital status: Married    Spouse name: Not on file   Number of children: 2   Years of education: Not on file   Highest education level: 12th grade  Occupational History   Occupation: Chief Technology Officer: [employer]    Comment: [employer], travelling 3-4 nights a week  Tobacco Use   Smoking status: Never   Smokeless tobacco: Never  Vaping Use   Vaping status: Never Used  Substance and Sexual Activity   Alcohol  use: No   Drug use: No   Sexual activity: Yes  Other Topics Concern   Not on file  Social History Narrative   Married 1975   2 daughters and 2 grandkids   Retired 2018.      Right Handed    Lives  in a two story home    Social Drivers of Health   Financial Resource Strain: Low Risk  (08/02/2023)   Overall Financial Resource Strain (CARDIA)    Difficulty of Paying Living Expenses: Not hard at all  Food Insecurity: No Food Insecurity (12/20/2023)   Hunger Vital Sign    Worried About Running Out of Food in the Last Year: Never true    Ran Out of Food in the Last Year: Never true  Transportation Needs: No Transportation Needs (12/20/2023)   PRAPARE - Administrator, Civil Service (Medical): No    Lack of Transportation (Non-Medical): No  Physical Activity: Insufficiently Active (08/02/2023)   Exercise Vital Sign    Days of Exercise per Week: 2 days    Minutes of Exercise  per Session: 30 min  Stress: No Stress Concern Present (08/02/2023)   [employer] of Occupational Health - Occupational Stress Questionnaire    Feeling of Stress : Only a little  Social Connections: Moderately Isolated (12/20/2023)   Social Connection and Isolation Panel    Frequency of Communication with Friends and Family: More than three times a week    Frequency of Social Gatherings with Friends and Family: More than three times a week    Attends Religious Services: Never    Database administrator or Organizations: No    Attends Banker Meetings: Never    Marital Status: Married   Past Surgical History:  Procedure Laterality Date   APPENDECTOMY  1975   CARDIAC CATHETERIZATION  12-22-2001   dr Kay Parson   severe native vessel disease mLAD 60-70%,  total occulsion pCFX  and pRCA/  widely patent saphenous vein , free radial , and LIMA grafts/  minminal lv dysfunction, ef 60%   CHEST TUBE INSERTION Right 10/11/2019   CORONARY ARTERY BYPASS GRAFT  12/1998   LIMA to LAD,  SVG to PDA and Diagonal, Free radial graft to OM   CORONARY STENT INTERVENTION N/A 05/26/2021   Procedure: CORONARY STENT INTERVENTION;  Surgeon: Kyra Phy, MD;  [facility];  Service:  Cardiovascular;  Laterality: N/A;   Exericse treadmill test  last one 01-03-2014  dr Kay Parson   normal exercise tolerance w/ hypertensive repsonse,  no ischemic EKG changes, appropriate HR response & recovery (Duke TM score 9;  Low Risk , PVC's w/ exertion)   EYE SURGERY     FRACTURE SURGERY     LEFT HEART CATH AND CORS/GRAFTS ANGIOGRAPHY N/A 05/26/2021   Procedure: LEFT HEART CATH AND CORS/GRAFTS ANGIOGRAPHY;  Surgeon: Kyra Phy, MD;  Location: [facility];  Service: Cardiovascular;  Laterality: N/A;   LOOP RECORDER INSERTION N/A 12/22/2022   Procedure: LOOP RECORDER INSERTION;  Surgeon: Tammie Fall, MD;  [facility]: [facility];  Service: Cardiovascular;  Laterality: N/A;   PROSTATE BIOPSY     RADIOACTIVE SEED IMPLANT N/A 01/13/2016   Procedure: RADIOACTIVE SEED IMPLANT/BRACHYTHERAPY IMPLANT;  Surgeon: Ann Barnacle, MD;  Location: [facility];  Service: Urology;  Laterality: N/A;   Past Medical History:  Diagnosis Date   Anxiety state 03/16/2016   Aortic stenosis    Mild to moderate by echo 12/2023 with mean aortic valve gradient 12 mmHg   Aphasia 01/26/2017   Benign localized prostatic hyperplasia with lower urinary tract symptoms (LUTS)    BPV (benign positional vertigo) 11/02/2014   Carotid artery stenosis    PER DUPLEX 10-22-2015  BILATERAL ICA 1-39%   Cataract    Coronary artery disease    CABG 2000 with LIMA> LAD, SVG > RCA and free radial > OM. 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   Gout    Gout 07/18/2010   Qualifier: Diagnosis of  By: Vallarie Gauze MD, Tyrone Gallop     H/O eye injury    chronic changes to left eye after injury   Hand weakness  03/02/2018   Hearing loss 04/21/2013   History of TIA (transient ischemic attack)  03/ 2017  no residual after brief episode loss peripheral vision   HOH (hard of hearing)    Hyperglycemia 05/17/2016   Hyperlipemia    Hyperlipidemia 07/18/2010   Qualifier: Diagnosis of  By: Vallarie Gauze MD, Tyrone Gallop     Hypertension    HYPERTENSION, BENIGN ESSENTIAL 07/18/2010   Qualifier: Diagnosis of  By: Vallarie Gauze MD, Graham     Hypokalemia 01/26/2017   Knee pain 03/13/2019   Left shoulder pain 09/04/2018   Pneumothorax 10/11/2019   RIGHT    Prostate cancer (HCC) UROLOGIST-  DR GRAPEY/  ONCOLOGIST-  DR MANNING   dx 2015--- Stage T1c, Gleason 3+4, PSA 4.03, vol 44cc   PVC's (premature ventricular contractions)    Rash and nonspecific skin eruption 10/14/2015   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   S/P CABG x 05 Dec 1998   Skin lesion 10/14/2015   Stroke [facility])    TIA (transient ischemic attack) 10/14/2015   Ventral hernia 10/26/2011   Wears glasses    There were no vitals taken for this visit.  Opioid Risk Score:   Fall Risk Score:  `1  Depression screen [facility] 2/9     01/04/2024   10:03 AM 08/02/2023    2:44 PM 02/16/2023    1:44 PM 11/26/2022   11:35 AM 12/19/2021    3:33 PM 03/01/2018   12:56 PM 08/19/2015    4:03 PM  Depression screen PHQ 2/9  Decreased Interest 0 0 0 0 0 0 0  Down, Depressed, Hopeless 0 0 0 0 0 0 0  PHQ - 2 Score 0 0 0 0 0 0 0  Altered sleeping 0 0  0  0   Tired, decreased energy 0 1  2  0   Change in appetite 0 0  0  0   Feeling bad or failure about yourself  0 0  0  0   Trouble concentrating 0 0  0  0   Moving slowly or fidgety/restless 0 0  0  0   Suicidal thoughts 0 0  0  0   PHQ-9 Score 0 1  2  0   Difficult doing work/chores Not difficult at all Not difficult at all  Not difficult at all  Not difficult at all     Review of Systems  All other systems reviewed and are negative.     Objective:   Physical Exam Vitals and nursing note reviewed.   Constitutional:      Appearance: Normal appearance.   Cardiovascular:     Rate and Rhythm: Normal rate and regular rhythm.     Pulses: Normal pulses.     Heart sounds: Normal heart sounds.  Pulmonary:     Effort: Pulmonary effort is normal.     Breath sounds: Normal breath sounds.  Genitourinary:    Comments: Normal Muscle Bulk and Muscle Testing Reveals:  Upper Extremities: Full ROM and Muscle Strength 5/5 Lower Extremities : Full ROM and Muscle Strength 5/5 Arises from table slowly Narrow Based Gait     Skin:    General: Skin is warm and dry.   Neurological:     Mental Status: He is alert and oriented to person, place, and time.   Psychiatric:        Mood and Affect: Mood normal.        Behavior: Behavior normal.         Assessment & Plan:  Acute CVA: [facility] called to schedule HFU  appoint, left Message. Ms. Krogh states she called to schedule HFU, was awaiting on return call. She will let this provider know I she  doesn't receive a call by mid-week. She verbalizes understanding.  2.  Essential Hypertension: Continue current medication regimen. PCP ollowing. Continue to monitor.  3.   Hyperlipidemia.: Continue current medication regimen. PCP following. Continue to Monitor.   F/U with Dr Raynaldo Call in 4-6 weeks

## 2024-01-15 NOTE — Progress Notes (Signed)
 Agree.  Thanks.  Gwynda Leriche Vallarie Gauze, MD 01/15/24  6:15 PM

## 2024-01-17 ENCOUNTER — Encounter: Payer: Self-pay | Admitting: Registered Nurse

## 2024-01-17 ENCOUNTER — Encounter: Attending: Registered Nurse | Admitting: Registered Nurse

## 2024-01-17 VITALS — BP 124/86 | HR 98 | Ht 70.0 in | Wt 211.0 lb

## 2024-01-17 DIAGNOSIS — I10 Essential (primary) hypertension: Secondary | ICD-10-CM | POA: Insufficient documentation

## 2024-01-17 DIAGNOSIS — E7849 Other hyperlipidemia: Secondary | ICD-10-CM | POA: Diagnosis not present

## 2024-01-17 DIAGNOSIS — I639 Cerebral infarction, unspecified: Secondary | ICD-10-CM | POA: Diagnosis not present

## 2024-01-18 ENCOUNTER — Encounter: Payer: Self-pay | Admitting: Family Medicine

## 2024-01-21 NOTE — Progress Notes (Signed)
 [facility] Summary Report / Loop Recorder

## 2024-01-24 ENCOUNTER — Ambulatory Visit: Admitting: Occupational Therapy

## 2024-01-24 ENCOUNTER — Encounter: Payer: Self-pay | Admitting: Physical Therapy

## 2024-01-24 ENCOUNTER — Ambulatory Visit: Admitting: Physical Therapy

## 2024-01-24 ENCOUNTER — Ambulatory Visit

## 2024-01-24 VITALS — BP 101/69 | HR 93

## 2024-01-24 DIAGNOSIS — M6281 Muscle weakness (generalized): Secondary | ICD-10-CM

## 2024-01-24 DIAGNOSIS — I639 Cerebral infarction, unspecified: Secondary | ICD-10-CM | POA: Diagnosis not present

## 2024-01-24 DIAGNOSIS — I69318 Other symptoms and signs involving cognitive functions following cerebral infarction: Secondary | ICD-10-CM

## 2024-01-24 DIAGNOSIS — R2689 Other abnormalities of gait and mobility: Secondary | ICD-10-CM | POA: Diagnosis not present

## 2024-01-24 DIAGNOSIS — R2681 Unsteadiness on feet: Secondary | ICD-10-CM | POA: Diagnosis not present

## 2024-01-24 DIAGNOSIS — R41841 Cognitive communication deficit: Secondary | ICD-10-CM | POA: Diagnosis not present

## 2024-01-24 NOTE — Therapy (Signed)
 OUTPATIENT SPEECH LANGUAGE PATHOLOGY EVALUATION   Patient Name: Tyler Guerra. MRN: [id number] DOB:[birth year], 73 y.o., male 42 Date: 01/24/2024  PCP: Cleatus Arlyss RAMAN MD REFERRING PROVIDER: Maurice Sharlet RAMAN, PA-C  END OF SESSION:  End of Session - 01/24/24 1100     Visit Number 1    Number of Visits 9    Date for SLP Re-Evaluation 03/20/24    Authorization Type Medicare A&B/ BCBS    SLP Start Time 1100    SLP Stop Time  1151    SLP Time Calculation (min) 51 min    Activity Tolerance Patient tolerated treatment well          Past Medical History:  Diagnosis Date   Anxiety state 03/16/2016   Aortic stenosis    Mild to moderate by echo 12/2023 with mean aortic valve gradient 12 mmHg   Aphasia 01/26/2017   Benign localized prostatic hyperplasia with lower urinary tract symptoms (LUTS)    BPV (benign positional vertigo) 11/02/2014   Carotid artery stenosis    PER DUPLEX 10-22-2015  BILATERAL ICA 1-39%   Cataract    Coronary artery disease    CABG 2000 with LIMA> LAD, SVG > RCA and free radial > OM. S/P NSTEMI 05/2021 cath with luiminal Irrg SVG>RCA, occluded prox RCA, patent free radial>OM, occluded oLCx, occluded o1   Depression 01/26/2017   Diabetes mellitus without complication (HCC) 03/02/2018   GERD 07/19/2010   Qualifier: Diagnosis of  By: Cleatus MD, Arlyss     GERD (gastroesophageal reflux disease)    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   Gout    Gout 07/18/2010   Qualifier: Diagnosis of  By: Cleatus MD, Arlyss     H/O eye injury    chronic changes to left eye after injury   Hand weakness 03/02/2018   Hearing loss 04/21/2013   History of TIA (transient ischemic attack)    03/ 2017  no residual after brief episode loss peripheral vision   HOH (hard of hearing)    Hyperglycemia 05/17/2016   Hyperlipemia    Hyperlipidemia 07/18/2010   Qualifier: Diagnosis of  By: Cleatus MD, Graham     Hypertension    HYPERTENSION, BENIGN ESSENTIAL 07/18/2010   Qualifier:  Diagnosis of  By: Cleatus MD, Graham     Hypokalemia 01/26/2017   Knee pain 03/13/2019   Left shoulder pain 09/04/2018   Pneumothorax 10/11/2019   RIGHT    Prostate cancer (HCC) UROLOGIST-  DR GRAPEY/  ONCOLOGIST-  DR MANNING   dx 2015--- Stage T1c, Gleason 3+4, PSA 4.03, vol 44cc   PVC's (premature ventricular contractions)    Rash and nonspecific skin eruption 10/14/2015   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   S/P CABG x 05 Dec 1998   Skin lesion 10/14/2015   Stroke [facility])    TIA (transient ischemic attack) 10/14/2015   Ventral hernia 10/26/2011   Wears glasses    Past Surgical History:  Procedure Laterality Date   APPENDECTOMY  1975   CARDIAC CATHETERIZATION  12-22-2001   dr victory sharps   severe native vessel disease mLAD 60-70%,  total occulsion pCFX  and pRCA/  widely patent saphenous vein , free radial , and LIMA grafts/  minminal lv dysfunction, ef 60%   CHEST TUBE INSERTION Right 10/11/2019   CORONARY ARTERY BYPASS GRAFT  12/1998   LIMA to LAD,  SVG to PDA and Diagonal, Free radial graft to OM   CORONARY STENT INTERVENTION N/A 05/26/2021  Procedure: CORONARY STENT INTERVENTION;  Surgeon: Thukkani, Arun K, MD;  [facility];  Service: Cardiovascular;  Laterality: N/A;   Exericse treadmill test  last one 01-03-2014  dr victory sharps   normal exercise tolerance w/ hypertensive repsonse,  no ischemic EKG changes, appropriate HR response & recovery (Duke TM score 9;  Low Risk , PVC's w/ exertion)   EYE SURGERY     FRACTURE SURGERY     LEFT HEART CATH AND CORS/GRAFTS ANGIOGRAPHY N/A 05/26/2021   Procedure: LEFT HEART CATH AND CORS/GRAFTS ANGIOGRAPHY;  Surgeon: Wendel Lurena POUR, MD;  [facility];  Service: Cardiovascular;  Laterality: N/A;   LOOP RECORDER INSERTION N/A 12/22/2022   Procedure: LOOP RECORDER INSERTION;  Surgeon: Waddell Danelle ORN, MD;  [facility];  Service: Cardiovascular;  Laterality: N/A;   PROSTATE  BIOPSY     RADIOACTIVE SEED IMPLANT N/A 01/13/2016   Procedure: RADIOACTIVE SEED IMPLANT/BRACHYTHERAPY IMPLANT;  Surgeon: Alm Fragmin, MD;  Location: [facility];  Service: Urology;  Laterality: N/A;   Patient Active Problem List   Diagnosis Date Noted   Insomnia 01/05/2024   Stroke, 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   Diabetes mellitus without complication (HCC) 12/05/2023   Health care maintenance 12/01/2023   Acute CVA (cerebrovascular accident) (HCC) 12/22/2022   History of CVA (cerebrovascular accident) 12/22/2022   Constipation 03/04/2022   CAD (coronary artery disease) 05/24/2021   NSTEMI (non-ST elevated myocardial infarction) (HCC) 05/24/2021   Pneumothorax 10/11/2019   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   Knee joint symptom 03/13/2019   Left shoulder pain 09/04/2018   Anxiety state 03/16/2016   Pseudophakia of both eyes 02/24/2016   TIA (transient ischemic attack) 10/14/2015   BPV (benign positional vertigo) 11/02/2014   Advance care planning 05/13/2014   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   Hearing loss 04/21/2013   Ventral hernia 10/26/2011   Medicare annual wellness visit, subsequent 10/26/2011   Prostate cancer (HCC) 10/26/2011   Coronary atherosclerosis 07/19/2010   GERD 07/19/2010   Hyperlipidemia 07/18/2010   Gout 07/18/2010   Hypertension 07/18/2010    ONSET DATE: 12/28/2023 (referral date)   REFERRING DIAG: I63.9 (ICD-10-CM) - Acute CVA (cerebrovascular accident)  THERAPY DIAG: Cognitive communication deficit  Rationale for Evaluation and Treatment: Rehabilitation  SUBJECTIVE:   SUBJECTIVE STATEMENT: Tyler Guerra is HOH at baseline and required intermittent repetition in quiet environment. Endorsed some use of memory strategies at home Pt accompanied by: significant other, Tyler Guerra   PERTINENT HISTORY: Pt is a 73 y/o male with PMH of HTN, prostate cancer, right ACA stroke in 12/2022, gout, CAD, anxiety, BPPV, CABG, and HOH who presented  to [facility] on 12/19/23 with c/o left sided hemiparesis and R gaze preference with associated HA. In ED NIHSS 11, CT head negative, CTA with focal occlusion vs severe near occlusion of right ACA A3 segment. Febrile to 100.8 but no leukocytosis. MRI showed small stroke in the right parietal white matter. Neurology recommendations for aspirin  and plavix  and loop recorder interrogation. EEG showed moderate diffuse encephalopathy but no seizures, loop showed no afib. Fever workup negative so broad spectrum abx stopped.   PAIN:  Are you having pain? No  FALLS: Has patient fallen in last 6 months?  No  LIVING ENVIRONMENT: Lives with: lives with their family Lives in: House/apartment  PLOF:  Level of assistance: Independent with ADLs, Independent with IADLs Employment: Retired  PATIENT GOALS: Return to baseline  OBJECTIVE:  Note: Objective measures were completed at  Evaluation unless otherwise noted.  COGNITION: Overall cognitive status: Impaired Areas of impairment:  Attention: Impaired: Alternating, Divided Memory: Impaired: Working Teacher, music term Prospective Awareness: Impaired: Intellectual, Emergent, and Anticipatory Executive function: Impaired: Problem solving, Organization, Planning, Error awareness, Self-correction, and Slow processing Behavior: Poor frustration tolerance Functional deficits: Endorsed some challenges with short term memory and problem solving (I.e., opening dog gates). Wife indicated pt presenting with slower processing. Wife manages all iADLs since first stroke   COGNITIVE COMMUNICATION: Following directions: Follows multi-step commands inconsistently  Auditory comprehension: Impaired: HOH Verbal expression: WFL Functional communication: WFL  ORAL MOTOR EXAMINATION: Overall status: WFL  STANDARDIZED ASSESSMENTS: CLQT subtests completed: Symbol Cancellation: 8/10 Clock Drawing: 11/13 Symbol Trails: 4/10 Design Generation: 3/13   PATIENT REPORTED  OUTCOME MEASURES (PROM): Not completed d/t time constraints                                                                                                                            TREATMENT DATE:  01/24/24: ST evaluation and POC complete. Utilizing some cognitive compensations, such as internal repetition and writing to-do lists to support cognition at home. Endorsed use of daily schedule in CIR but not currently utilizing at home. Recommended re-starting this practice given overt benefit. Pt and wife verbalized understanding and agreement.     PATIENT EDUCATION: Education details: see above Person educated: Patient and Spouse Education method: Explanation Education comprehension: verbalized understanding and needs further education   GOALS: Goals reviewed with patient? Yes  SHORT TERM GOALS: Target date: 02/21/2024  Pt will complete daily schedule x2 opportunities given rare min A Baseline: Goal status: INITIAL  2.  Pt will utilize strategies to aid successful completion of novel task/routine given occasional min A Baseline:  Goal status: INITIAL  3.  Pt will self-advocate for additional processing time for x2 opportunities given rare min A  Baseline:  Goal status: INITIAL   LONG TERM GOALS: Target date: 03/20/2024  Pt will utilize written/visual cognitive supports (to-list, daily schedule) > 1 week given rare min A  Baseline:  Goal status: INITIAL  2.  Pt will carryover cognitive compensations to complete novel household tasks given rare min A  Baseline:  Goal status: INITIAL  3.  Pt and caregiver will report improved cognitive linguistic functioning by LTG date Baseline:  Goal status: INITIAL   ASSESSMENT:  CLINICAL IMPRESSION: Patient is a 73 y.o. M who was seen today for ST evaluation s/p stroke. Pt is accompanied by wife, Corean. Pt questioned age-related recall challenges but wife indicated decreased processing speed and some difficulty problem solving  more novel situations since most recent stroke. Completed majority of executive functioning subtests on CLQT, which revealed significant difficulty recalling and completing multi-step instructions, reduced problem solving, and decreased error awareness. Given change in baseline functioning and functional impact on daily tasks, pt would benefit from skilled ST intervention to optimize return to baseline and increased functional independence.   OBJECTIVE IMPAIRMENTS: include attention, memory, awareness, and  executive functioning. These impairments are limiting patient from household responsibilities. Factors affecting potential to achieve goals and functional outcome are previous level of function.. Patient will benefit from skilled SLP services to address above impairments and improve overall function.  REHAB POTENTIAL: Good  PLAN:  SLP FREQUENCY: 1x/week  SLP DURATION: 8 weeks  PLANNED INTERVENTIONS: Environmental controls, Cognitive reorganization, Functional tasks, Multimodal communication approach, SLP instruction and feedback, Compensatory strategies, Patient/family education, [phone] Treatment of speech (30 or 45 min) , and [id number]- Speech Eval Sound Prod, Artic, Phon, Eval Compre, Express    Comer LILLETTE Louder, CCC-SLP 01/24/2024, 2:59 PM

## 2024-01-24 NOTE — Therapy (Signed)
 OUTPATIENT PHYSICAL THERAPY NEURO TREATMENT   Patient Name: Tyler Guerra. MRN: [id number] DOB:1951-05-13, 73 y.o., male 1 Date: 01/24/2024   PCP: Cleatus Arlyss RAMAN, MD  REFERRING PROVIDER: Maurice Sharlet RAMAN DEVONNA    END OF SESSION:  PT End of Session - 01/24/24 1030     Visit Number 2    Number of Visits 5    Date for PT Re-Evaluation 03/13/24   due to potential delay in scheduling   PT Start Time 1029   pt late to session   PT Stop Time 1100    PT Time Calculation (min) 31 min    Equipment Utilized During Treatment Gait belt    Activity Tolerance Patient tolerated treatment well    Behavior During Therapy [facility] for tasks assessed/performed          Past Medical History:  Diagnosis Date   Anxiety state 03/16/2016   Aortic stenosis    Mild to moderate by echo 12/2023 with mean aortic valve gradient 12 mmHg   Aphasia 01/26/2017   Benign localized prostatic hyperplasia with lower urinary tract symptoms (LUTS)    BPV (benign positional vertigo) 11/02/2014   Carotid artery stenosis    PER DUPLEX 10-22-2015  BILATERAL ICA 1-39%   Cataract    Coronary artery disease    CABG 2000 with LIMA> LAD, SVG > RCA and free radial > OM. S/P NSTEMI 05/2021 cath with luiminal Irrg SVG>RCA, occluded prox RCA, patent free radial>OM, occluded oLCx, occluded o1   Depression 01/26/2017   Diabetes mellitus without complication (HCC) 03/02/2018   GERD 07/19/2010   Qualifier: Diagnosis of  By: Cleatus MD, Arlyss     GERD (gastroesophageal reflux disease)    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   Gout    Gout 07/18/2010   Qualifier: Diagnosis of  By: Cleatus MD, Arlyss     H/O eye injury    chronic changes to left eye after injury   Hand weakness 03/02/2018   Hearing loss 04/21/2013   History of TIA (transient ischemic attack)    03/ 2017  no residual after brief episode loss peripheral vision   HOH (hard of hearing)    Hyperglycemia 05/17/2016   Hyperlipemia    Hyperlipidemia 07/18/2010    Qualifier: Diagnosis of  By: Cleatus MD, Graham     Hypertension    HYPERTENSION, BENIGN ESSENTIAL 07/18/2010   Qualifier: Diagnosis of  By: Cleatus MD, Graham     Hypokalemia 01/26/2017   Knee pain 03/13/2019   Left shoulder pain 09/04/2018   Pneumothorax 10/11/2019   RIGHT    Prostate cancer (HCC) UROLOGIST-  DR GRAPEY/  ONCOLOGIST-  DR MANNING   dx 2015--- Stage T1c, Gleason 3+4, PSA 4.03, vol 44cc   PVC's (premature ventricular contractions)    Rash and nonspecific skin eruption 10/14/2015   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   S/P CABG x 05 Dec 1998   Skin lesion 10/14/2015   Stroke [facility])    TIA (transient ischemic attack) 10/14/2015   Ventral hernia 10/26/2011   Wears glasses    Past Surgical History:  Procedure Laterality Date   APPENDECTOMY  1975   CARDIAC CATHETERIZATION  12-22-2001   dr victory sharps   severe native vessel disease mLAD 60-70%,  total occulsion pCFX  and pRCA/  widely patent saphenous vein , free radial , and LIMA grafts/  minminal lv dysfunction, ef 60%   CHEST TUBE INSERTION Right 10/11/2019   CORONARY ARTERY BYPASS GRAFT  12/1998   LIMA to LAD,  SVG to PDA and Diagonal, Free radial graft to OM   CORONARY STENT INTERVENTION N/A 05/26/2021   Procedure: CORONARY STENT INTERVENTION;  Surgeon: Wendel Lurena POUR, MD;  Location: [facility] INVASIVE CV [facility];  Service: Cardiovascular;  Laterality: N/A;   Exericse treadmill test  last one 01-03-2014  dr victory sharps   normal exercise tolerance w/ hypertensive repsonse,  no ischemic EKG changes, appropriate HR response & recovery ([facility] TM score 9;  Low Risk , PVC's w/ exertion)   EYE SURGERY     FRACTURE SURGERY     LEFT HEART CATH AND CORS/GRAFTS ANGIOGRAPHY N/A 05/26/2021   Procedure: LEFT HEART CATH AND CORS/GRAFTS ANGIOGRAPHY;  Surgeon: Wendel Lurena POUR, MD;  Location: [facility] INVASIVE CV LAB;  Service: Cardiovascular;  Laterality: N/A;   LOOP RECORDER INSERTION N/A 12/22/2022   Procedure: LOOP RECORDER  INSERTION;  Surgeon: Waddell Danelle ORN, MD;  Location: [facility] INV[facility] CV [facility];  Service: Cardiovascular;  Laterality: N/A;   PROSTATE BIOPSY     RADIOACTIVE SEED IMPLANT N/A 01/13/2016   Procedure: RADIOACTIVE SEED IMPLANT/BRACHYTHERAPY IMPLANT;  Surgeon: Alm Fragmin, MD;  Location: [facility];  Service: Urology;  Laterality: N/A;   Patient Active Problem List   Diagnosis Date Noted   Insomnia 01/05/2024   Stroke, 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   Diabetes mellitus without complication (HCC) 12/05/2023   Health care maintenance 12/01/2023   Acute CVA (cerebrovascular accident) (HCC) 12/22/2022   History of CVA (cerebrovascular accident) 12/22/2022   Constipation 03/04/2022   CAD (coronary artery disease) 05/24/2021   NSTEMI (non-ST elevated myocardial infarction) (HCC) 05/24/2021   Pneumothorax 10/11/2019   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   Knee joint symptom 03/13/2019   Left shoulder pain 09/04/2018   Anxiety state 03/16/2016   Pseudophakia of both eyes 02/24/2016   TIA (transient ischemic attack) 10/14/2015   BPV (benign positional vertigo) 11/02/2014   Advance care planning 05/13/2014   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   Hearing loss 04/21/2013   Ventral hernia 10/26/2011   Medicare annual wellness visit, subsequent 10/26/2011   Prostate cancer (HCC) 10/26/2011   Coronary atherosclerosis 07/19/2010   GERD 07/19/2010   Hyperlipidemia 07/18/2010   Gout 07/18/2010   Hypertension 07/18/2010    ONSET DATE: 12/28/2023  REFERRING DIAG: I63.9 (ICD-10-CM) - Acute CVA (cerebrovascular accident) (HCC)   THERAPY DIAG:  Other symptoms and signs involving cognitive functions following cerebral infarction  Muscle weakness (generalized)  Unsteadiness on feet  Rationale for Evaluation and Treatment: Rehabilitation  SUBJECTIVE:  SUBJECTIVE STATEMENT:  Had a celebration of life for his mom this past weekend and things were busy. No falls.   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 CT perfusion showed no core infarct. MRI brain showed 3 mm infarct right periatrial white matter with background parenchymal atrophy. EEG was negative for seizures and was suggestive of encephalopathy   Received inpatient rehab and was discharged 12/30/23  PAIN:  Are you having pain? No  Vitals:   01/24/24 1033  BP: 101/69  Pulse: 93      PRECAUTIONS: Fall  FALLS: Has patient fallen in last 6 months? No  LIVING ENVIRONMENT: Lives with: lives with their spouse and family close by Lives in: House/apartment Stairs: Yes: Internal: 12 steps; on right going up, level entry into house  Has following equipment at home: Single point cane, Walker - 2 wheeled, and reports needs to put some   PLOF: Independent  PATIENT GOALS: Wants to get back his normal balance, just wants any improvement  OBJECTIVE:  Note: Objective measures were completed at Evaluation unless otherwise noted.  DIAGNOSTIC FINDINGS: MRI brain 12/19/23 IMPRESSION: 1. 3 mm acute infarct within the right periatrial white matter. 2. Background parenchymal atrophy, chronic small vessel disease and redemonstrated chronic infarcts as described.  COGNITION: Overall cognitive status: Impaired   SENSATION: WFL Pt denies numbness/tingling in BLE   COORDINATION: Heel to shin: WNL bilat    POSTURE: rounded shoulders and forward  head   LOWER EXTREMITY MMT:    MMT Right Eval Left Eval  Hip flexion 5 5  Hip extension    Hip abduction    Hip adduction    Hip internal rotation    Hip external rotation    Knee flexion 5 5  Knee extension 5 5  Ankle dorsiflexion 5 4+  Ankle plantarflexion    Ankle inversion    Ankle eversion    (Blank rows = not tested)  BED MOBILITY:  Pt denies difficulties   TRANSFERS: Sit to stand: Complete Independence  Assistive device utilized: None     Stand to sit: Complete Independence  Assistive device utilized: None      Performs with no UE support   STAIRS:  Level of Assistance: SBA  Stair Negotiation Technique: Alternating Pattern  Forwards with Single Rail on Right  Number of Stairs: 4   Height of Stairs: 6  Comments: when trying to descend without a railing, pt performs with step to pattern    GAIT: Gait pattern: step through pattern and decreased stride length Distance walked: Clinic distances  Assistive device utilized: None Level of assistance: Modified independence Slightly bilateral external rotation noted during gait    FUNCTIONAL TESTS:  5 times sit to stand: 11.5 seconds no UE support  30 seconds chair stand test: 12 sit <> stands with no UE support (<12 below normal for age) 10 meter walk test: 11.6 seconds no AD = 2.83 ft/sec   SLS: RLE: 7 seconds, LLE: 1-2 seconds  TREATMENT DATE:   Therapeutic Activity:  Vitals:   01/24/24 1033  BP: 101/69  Pulse: 93    Access Code: [email or web address] URL: [email or web address] Date: 01/24/2024 Prepared by: Sheffield Senate  Initiated HEP for balance:   Exercises - Tandem Walking with Counter Support  - 1-2 x daily - 5 x weekly - 3 sets  - Standing Marching  - 1-2 x daily - 5 x weekly - 3 sets - pt more challenged with this with cognition/coordination, taking incr  time when performing  - Standing Single Leg Stance with Counter Support  - 1-2 x daily - 5 x weekly - 3 sets - 15 hold - pt more challenged with SLS tasks on LLE  - Standing Balance with Eyes Closed on Foam  - 1-2 x daily - 5 x weekly - 3 sets - 30 hold  Pt taking incr time to perform exercises, cognitive deficits more apparent this session when working on balance tasks   PATIENT EDUCATION: Education details: Initial HEP and purpose of exercises  Person educated: Patient and Spouse Education method: Programmer, multimedia, Facilities manager, Verbal cues, and Handouts Education comprehension: verbalized understanding, returned demonstration, verbal cues required, and needs further education  HOME EXERCISE PROGRAM: Access Code: [email or web address] URL: [email or web address] Date: 01/24/2024 Prepared by: Sheffield Senate  Initiated HEP for balance:   Exercises - Tandem Walking with Counter Support  - 1-2 x daily - 5 x weekly - 3 sets - Standing Marching  - 1-2 x daily - 5 x weekly - 3 sets - Standing Single Leg Stance with Counter Support  - 1-2 x daily - 5 x weekly - 3 sets - 15 hold - Standing Balance with Eyes Closed on Foam  - 1-2 x daily - 5 x weekly - 3 sets - 30 hold  GOALS: Goals reviewed with patient? Yes  SHORT TERM GOALS: ALL STGS = LTGS   LONG TERM GOALS: Target date: 02/10/2024  Pt will be independent with final HEP for strength/balance in order to build upon functional gains made in therapy. Baseline:  Goal status: INITIAL  2.  Pt will improve FGA to at least a 28/30 in order to demo decr fall risk.  Baseline: 25/30 Goal status: INITIAL  3.  Pt will improve 30 second chair stand to 14 in order to demo improved functional strength/endurance  Baseline: 12 sit <> stands Goal status: INITIAL  4.  Pt will improve SLS consistently on LLE to at least 5 seconds for improved stability for balance Baseline: 1-2 seconds  Goal status: INITIAL    ASSESSMENT:  CLINICAL  IMPRESSION: Session limited due to pt arriving late and having speech therapy eval right after PT. Today's skilled session focused primarily on initiating HEP for SLS stability, narrow BOS with tandem gait, and EC tasks on compliant surfaces. Pt's cognitive deficits more apparent this session, esp when performing walking marching at countertop. Pt performing slowly and reporting really having to focus on this exercise. Pt more challenged with SLS tasks on LLE. Will continue per POC.    OBJECTIVE IMPAIRMENTS: Abnormal gait, decreased activity tolerance, decreased balance, decreased cognition, decreased strength, and postural dysfunction.   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   REHAB POTENTIAL: Good  CLINICAL DECISION MAKING: Stable/uncomplicated  EVALUATION COMPLEXITY: Low  PLAN:  PT FREQUENCY: 1x/week  PT DURATION: 8 weeks - due to potential delay in scheduling - only anticipate 4 weeks   PLANNED INTERVENTIONS: [id number]- PT Re-evaluation, 97110-Therapeutic exercises, 97530- Therapeutic activity, 97112- Neuromuscular re-education, 97535- Self Care, [id number]- Manual therapy, [phone]- Gait training, Patient/Family education, Balance training, Stair training, and Vestibular training  PLAN FOR NEXT SESSION: HEP for SLS, high level balance, EC, unlevel surfaces. Work on high level balance in clinic and with cog dual tasking    Sheffield LOISE Senate, PT, DPT 01/24/2024, 11:32 AM

## 2024-01-25 DIAGNOSIS — H2511 Age-related nuclear cataract, right eye: Secondary | ICD-10-CM | POA: Diagnosis not present

## 2024-01-25 DIAGNOSIS — H31012 Macula scars of posterior pole (postinflammatory) (post-traumatic), left eye: Secondary | ICD-10-CM | POA: Diagnosis not present

## 2024-01-25 DIAGNOSIS — H524 Presbyopia: Secondary | ICD-10-CM | POA: Diagnosis not present

## 2024-01-25 DIAGNOSIS — H43812 Vitreous degeneration, left eye: Secondary | ICD-10-CM | POA: Diagnosis not present

## 2024-01-25 DIAGNOSIS — H5213 Myopia, bilateral: Secondary | ICD-10-CM | POA: Diagnosis not present

## 2024-01-25 DIAGNOSIS — Z961 Presence of intraocular lens: Secondary | ICD-10-CM | POA: Diagnosis not present

## 2024-01-25 LAB — HM DIABETES EYE EXAM

## 2024-01-26 ENCOUNTER — Encounter: Payer: Self-pay | Admitting: Family Medicine

## 2024-01-27 ENCOUNTER — Encounter: Payer: Self-pay | Admitting: Family Medicine

## 2024-01-27 NOTE — Telephone Encounter (Signed)
 It appears that the prescription was originally written by Sharlet Sers with physical/rehab.

## 2024-01-30 ENCOUNTER — Other Ambulatory Visit: Payer: Self-pay | Admitting: Family Medicine

## 2024-01-30 MED ORDER — AMANTADINE HCL 100 MG PO CAPS: 100.0000 mg | ORAL_CAPSULE | Freq: Every day | ORAL | 0 refills | Status: DC

## 2024-01-31 ENCOUNTER — Encounter: Payer: Self-pay | Admitting: Physical Therapy

## 2024-01-31 ENCOUNTER — Ambulatory Visit: Admitting: Physical Therapy

## 2024-01-31 VITALS — BP 115/67 | HR 80

## 2024-01-31 DIAGNOSIS — I69318 Other symptoms and signs involving cognitive functions following cerebral infarction: Secondary | ICD-10-CM | POA: Diagnosis not present

## 2024-01-31 DIAGNOSIS — R2681 Unsteadiness on feet: Secondary | ICD-10-CM

## 2024-01-31 DIAGNOSIS — R2689 Other abnormalities of gait and mobility: Secondary | ICD-10-CM | POA: Diagnosis not present

## 2024-01-31 DIAGNOSIS — M6281 Muscle weakness (generalized): Secondary | ICD-10-CM

## 2024-01-31 DIAGNOSIS — R41841 Cognitive communication deficit: Secondary | ICD-10-CM | POA: Diagnosis not present

## 2024-01-31 DIAGNOSIS — I639 Cerebral infarction, unspecified: Secondary | ICD-10-CM | POA: Diagnosis not present

## 2024-01-31 NOTE — Therapy (Signed)
 OUTPATIENT PHYSICAL THERAPY NEURO TREATMENT   Patient Name: Tyler Guerra. MRN: [id number] DOB:1950/09/03, 73 y.o., male 47 Date: 01/31/2024   PCP: Cleatus Arlyss RAMAN, MD  REFERRING PROVIDER: Maurice Sharlet RAMAN DEVONNA    END OF SESSION:  PT End of Session - 01/31/24 1232     Visit Number 3    Number of Visits 5    Date for PT Re-Evaluation 03/13/24   due to potential delay in scheduling   PT Start Time 1231    PT Stop Time 1313    PT Time Calculation (min) 42 min    Equipment Utilized During Treatment Gait belt    Activity Tolerance Patient tolerated treatment well    Behavior During Therapy [facility] for tasks assessed/performed          Past Medical History:  Diagnosis Date   Anxiety state 03/16/2016   Aortic stenosis    Mild to moderate by echo 12/2023 with mean aortic valve gradient 12 mmHg   Aphasia 01/26/2017   Benign localized prostatic hyperplasia with lower urinary tract symptoms (LUTS)    BPV (benign positional vertigo) 11/02/2014   Carotid artery stenosis    PER DUPLEX 10-22-2015  BILATERAL ICA 1-39%   Cataract    Coronary artery disease    CABG 2000 with LIMA> LAD, SVG > RCA and free radial > OM. S/P NSTEMI 05/2021 cath with luiminal Irrg SVG>RCA, occluded prox RCA, patent free radial>OM, occluded oLCx, occluded o1   Depression 01/26/2017   Diabetes mellitus without complication (HCC) 03/02/2018   GERD 07/19/2010   Qualifier: Diagnosis of  By: Cleatus MD, Arlyss     GERD (gastroesophageal reflux disease)    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   Gout    Gout 07/18/2010   Qualifier: Diagnosis of  By: Cleatus MD, Arlyss     H/O eye injury    chronic changes to left eye after injury   Hand weakness 03/02/2018   Hearing loss 04/21/2013   History of TIA (transient ischemic attack)    03/ 2017  no residual after brief episode loss peripheral vision   HOH (hard of hearing)    Hyperglycemia 05/17/2016   Hyperlipemia    Hyperlipidemia 07/18/2010   Qualifier: Diagnosis  of  By: Cleatus MD, Graham     Hypertension    HYPERTENSION, BENIGN ESSENTIAL 07/18/2010   Qualifier: Diagnosis of  By: Cleatus MD, Graham     Hypokalemia 01/26/2017   Knee pain 03/13/2019   Left shoulder pain 09/04/2018   Pneumothorax 10/11/2019   RIGHT    Prostate cancer (HCC) UROLOGIST-  DR GRAPEY/  ONCOLOGIST-  DR MANNING   dx 2015--- Stage T1c, Gleason 3+4, PSA 4.03, vol 44cc   PVC's (premature ventricular contractions)    Rash and nonspecific skin eruption 10/14/2015   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   S/P CABG x 05 Dec 1998   Skin lesion 10/14/2015   Stroke [facility])    TIA (transient ischemic attack) 10/14/2015   Ventral hernia 10/26/2011   Wears glasses    Past Surgical History:  Procedure Laterality Date   APPENDECTOMY  1975   CARDIAC CATHETERIZATION  12-22-2001   dr victory sharps   severe native vessel disease mLAD 60-70%,  total occulsion pCFX  and pRCA/  widely patent saphenous vein , free radial , and LIMA grafts/  minminal lv dysfunction, ef 60%   CHEST TUBE INSERTION Right 10/11/2019   CORONARY ARTERY BYPASS GRAFT  12/1998   LIMA  to LAD,  SVG to PDA and Diagonal, Free radial graft to OM   CORONARY STENT INTERVENTION N/A 05/26/2021   Procedure: CORONARY STENT INTERVENTION;  Surgeon: Wendel Lurena POUR, MD;  Location: [facility] INVASIVE CV LAB;  Service: Cardiovascular;  Laterality: N/A;   Exericse treadmill test  last one 01-03-2014  dr victory sharps   normal exercise tolerance w/ hypertensive repsonse,  no ischemic EKG changes, appropriate HR response & recovery ([facility] TM score 9;  Low Risk , PVC's w/ exertion)   EYE SURGERY     FRACTURE SURGERY     LEFT HEART CATH AND CORS/GRAFTS ANGIOGRAPHY N/A 05/26/2021   Procedure: LEFT HEART CATH AND CORS/GRAFTS ANGIOGRAPHY;  Surgeon: Wendel Lurena POUR, MD;  Location: [facility] INVASIVE CV LAB;  Service: Cardiovascular;  Laterality: N/A;   LOOP RECORDER INSERTION N/A 12/22/2022   Procedure: LOOP RECORDER INSERTION;  Surgeon:  Waddell Danelle ORN, MD;  Location: [facility] INVASIVE CV LAB;  Service: Cardiovascular;  Laterality: N/A;   PROSTATE BIOPSY     RADIOACTIVE SEED IMPLANT N/A 01/13/2016   Procedure: RADIOACTIVE SEED IMPLANT/BRACHYTHERAPY IMPLANT;  Surgeon: Alm Fragmin, MD;  Location: [facility];  Service: Urology;  Laterality: N/A;   Patient Active Problem List   Diagnosis Date Noted   Insomnia 01/05/2024   Stroke, 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   Diabetes mellitus without complication (HCC) 12/05/2023   Health care maintenance 12/01/2023   Acute CVA (cerebrovascular accident) (HCC) 12/22/2022   History of CVA (cerebrovascular accident) 12/22/2022   Constipation 03/04/2022   CAD (coronary artery disease) 05/24/2021   NSTEMI (non-ST elevated myocardial infarction) (HCC) 05/24/2021   Pneumothorax 10/11/2019   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   Knee joint symptom 03/13/2019   Left shoulder pain 09/04/2018   Anxiety state 03/16/2016   Pseudophakia of both eyes 02/24/2016   TIA (transient ischemic attack) 10/14/2015   BPV (benign positional vertigo) 11/02/2014   Advance care planning 05/13/2014   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   Hearing loss 04/21/2013   Ventral hernia 10/26/2011   Medicare annual wellness visit, subsequent 10/26/2011   Prostate cancer (HCC) 10/26/2011   Coronary atherosclerosis 07/19/2010   GERD 07/19/2010   Hyperlipidemia 07/18/2010   Gout 07/18/2010   Hypertension 07/18/2010    ONSET DATE: 12/28/2023  REFERRING DIAG: I63.9 (ICD-10-CM) - Acute CVA (cerebrovascular accident) (HCC)   THERAPY DIAG:  Muscle weakness (generalized)  Unsteadiness on feet  Other abnormalities of gait and mobility  Rationale for Evaluation and Treatment: Rehabilitation  SUBJECTIVE:  SUBJECTIVE STATEMENT: Had a good weekend, hung out with his daughters. No falls.  Has been working on his exercises, marching is challenging. Slept with 2 pillows under his neck last night and now has a crick.   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 CT perfusion showed no core infarct. MRI brain showed 3 mm infarct right periatrial white matter with background parenchymal atrophy. EEG was negative for seizures and was suggestive of encephalopathy   Received inpatient rehab and was discharged 12/30/23  PAIN:  Are you having pain? No  Vitals:   01/31/24 1236  BP: 115/67  Pulse: 80     PRECAUTIONS: Fall  FALLS: Has patient fallen in last 6 months? No  LIVING ENVIRONMENT: Lives with: lives with their spouse and family close by Lives in: House/apartment Stairs: Yes: Internal: 12 steps; on right going up, level entry into house  Has following equipment at home: Single point cane, Walker - 2 wheeled, and reports needs to put some   PLOF: Independent  PATIENT GOALS: Wants to get back his normal balance, just wants any improvement  OBJECTIVE:  Note: Objective measures were completed at Evaluation unless otherwise noted.  DIAGNOSTIC FINDINGS: MRI brain 12/19/23 IMPRESSION: 1. 3 mm acute infarct within the right periatrial white matter. 2. Background parenchymal atrophy, chronic small vessel disease and redemonstrated chronic infarcts as described.  COGNITION: Overall cognitive status: Impaired   SENSATION: WFL Pt denies numbness/tingling in BLE   COORDINATION: Heel to shin: WNL bilat     POSTURE: rounded shoulders and forward head   LOWER EXTREMITY MMT:    MMT Right Eval Left Eval  Hip flexion 5 5  Hip extension    Hip abduction    Hip adduction    Hip internal rotation    Hip external rotation    Knee flexion 5 5  Knee extension 5 5  Ankle dorsiflexion 5 4+  Ankle plantarflexion    Ankle inversion    Ankle eversion    (Blank rows = not tested)  BED MOBILITY:  Pt denies difficulties   TRANSFERS: Sit to stand: Complete Independence  Assistive device utilized: None     Stand to sit: Complete Independence  Assistive device utilized: None      Performs with no UE support   STAIRS:  Level of Assistance: SBA  Stair Negotiation Technique: Alternating Pattern  Forwards with Single Rail on Right  Number of Stairs: 4   Height of Stairs: 6  Comments: when trying to descend without a railing, pt performs with step to pattern    GAIT: Gait pattern: step through pattern and decreased stride length Distance walked: Clinic distances  Assistive device utilized: None Level of assistance: Modified independence Slightly bilateral external rotation noted during gait    FUNCTIONAL TESTS:  5 times sit to stand: 11.5 seconds no UE support  30 seconds chair stand test: 12 sit <> stands with no UE support (<12 below normal for age) 10 meter walk test: 11.6 seconds no AD = 2.83 ft/sec   SLS: RLE: 7 seconds, LLE: 1-2 seconds  TREATMENT DATE:   Vitals:   01/31/24 1236  BP: 115/67  Pulse: 80    NMR: With 6 blaze pods (3 on first step, 3 on 2nd step) to work on weight shifting, SLS, foot clearance, reaction times. Performed on air ex for compliant surface, trying to perform with no UE support, CGA for balance  Performed 3 bouts of 1 minute each: 16 hits, 24 hits, 29 hits Performed 1 bout of 1 minute with cognitive challenging  naming foods: 10 hits  On blue foam beam:  10 reps sit <> stands with no UE support and working on immediate standing balance, pt initially more unsteady, but improved with incr reps, performed an additional 10 reps holding 10# medicine ball and throwing it once standing, cues for incr amplitude of toss and for leaning forwards to prevent BLE bracing against mat table   Alternating forward stepping off beam and tossing 1 kg red ball to rebounder and catching it, 15 reps each side, pt challenged with coordinating stepping and tossing ball at the same time esp on L side, CGA for balance  On rockerboard in A/P direction: Alternating SLS taps to 2 cones, 2 x 10 reps each side with cues for performing slowed and controlled, pt with improved balance with 2nd set EO 10 reps head turns when trying to keep board still   PATIENT EDUCATION: Education details: Continue HEP Person educated: Patient and Spouse Education method: Explanation Education comprehension: verbalized understanding  HOME EXERCISE PROGRAM: Access Code: [email or web address] URL: [email or web address] Date: 01/24/2024 Prepared by: Sheffield Senate  Initiated HEP for balance:   Exercises - Tandem Walking with Counter Support  - 1-2 x daily - 5 x weekly - 3 sets - Standing Marching  - 1-2 x daily - 5 x weekly - 3 sets - Standing Single Leg Stance with Counter Support  - 1-2 x daily - 5 x weekly - 3 sets - 15 hold - Standing Balance with Eyes Closed on Foam  - 1-2 x daily - 5 x weekly - 3 sets - 30 hold  GOALS: Goals reviewed with patient? Yes  SHORT TERM GOALS: ALL STGS = LTGS   LONG TERM GOALS: Target date: 02/10/2024  Pt will be independent with final HEP for strength/balance in order to build upon functional gains made in therapy. Baseline:  Goal status: INITIAL  2.  Pt will improve FGA to at least a 28/30 in order to demo decr fall risk.  Baseline: 25/30 Goal status: INITIAL  3.  Pt will improve 30 second chair  stand to 14 in order to demo improved functional strength/endurance  Baseline: 12 sit <> stands Goal status: INITIAL  4.  Pt will improve SLS consistently on LLE to at least 5 seconds for improved stability for balance Baseline: 1-2 seconds  Goal status: INITIAL    ASSESSMENT:  CLINICAL IMPRESSION: Today's skilled session focused on higher level balance tasks on compliant surfaces, anticipatory balance reactions, and SLS tasks. Pt fatigued easily with sit <> stands when standing on foam and having to toss a 10# medicine ball. With SLS tasks, pt demonstrated improvement in SLS stability/control with incr reps. Will continue per POC.     OBJECTIVE IMPAIRMENTS: Abnormal gait, decreased activity tolerance, decreased balance, decreased cognition, decreased strength, and postural dysfunction.   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   REHAB POTENTIAL: Good  CLINICAL DECISION MAKING: Stable/uncomplicated  EVALUATION COMPLEXITY: Low  PLAN:  PT FREQUENCY: 1x/week  PT DURATION: 8 weeks - due to potential delay in scheduling - only anticipate 4 weeks   PLANNED INTERVENTIONS: [id number]- PT Re-evaluation, 97110-Therapeutic exercises, 97530- Therapeutic activity, 97112- Neuromuscular re-education, 97535- Self Care, [id number]- Manual therapy, [phone]- Gait training, Patient/Family education, Balance training, Stair training, and Vestibular training  PLAN FOR NEXT SESSION: Work on high level balance with cog dual tasking, unlevel surfaces, EC, SLS.    Sheffield LOISE Senate, PT, DPT 01/31/2024, 3:41 PM

## 2024-02-01 ENCOUNTER — Ambulatory Visit (INDEPENDENT_AMBULATORY_CARE_PROVIDER_SITE_OTHER): Admitting: Neurology

## 2024-02-01 ENCOUNTER — Encounter: Payer: Self-pay | Admitting: Neurology

## 2024-02-01 VITALS — BP 109/69 | HR 90 | Ht 70.0 in | Wt 209.0 lb

## 2024-02-01 DIAGNOSIS — I639 Cerebral infarction, unspecified: Secondary | ICD-10-CM

## 2024-02-01 NOTE — Progress Notes (Signed)
 Follow-up Visit   Date: 02/01/2024    Tyler Guerra. MRN: [id number] DOB: 1950-10-05    Tyler Guerra. is a 73 y.o. right-handed male with CAD, hyperlipidemia, hypertension, GERD, anxiety/depression returning to the clinic for follow-up of right ACA infarct.  The patient was accompanied to the clinic by wife who also provides collateral information.    IMPRESSION/PLAN: Cerebrovascular disease with intracranial stenosis and history of small right periatrial white matter manifesting with left side weakness, left hemianopia, and confusion (12/19/2023) while on DAPT.  He also has right ACA territory infarct due to distal A1 occlusion (12/2022) manifesting with left leg weakness.  No evidence of afib on loop recorder.  He was discharged on aspirin  + Brilinta  x 4 weeks, then aspirin  alone as he failed DAPT.  Unfortunately, medication options are limited and he remains at risk for future stroke.  Secondary risk factors are well-controlled as noted below.   - Continue aspirin  81mg  daily  - Continue atorvastatin  80mg  daily (LDL 51)  - Blood pressure is well-controlled on medication  - HbA1c mildly elevated 6.7, low carb/sugar diet recommended  - Continue PT/OT  Return to clinic in 6 months --------------------------------------------- History of present illness: He was hospitalized in May 2024 due to left leg weakness x 3 weeks and found to have right ACA territory infarct due to distal A1 occlusion.  He was on aspirin  and plavix  already, so switched to aspirin  and Brilinta . There was concern that he may have embolic event.  Echo showed normal EF, no shunt.  He was discharged home and had loop recorder placed on 5/21.  He feels that the left leg is a little stronger, but it still tends to buckle when walking. He denies numbness/tingling of the left leg.    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  UPDATE 05/31/2023:  He is here for follow-up visit.  He completed PT and has  noticed that left leg buckling sensation and weakness has improved.  Sometimes, he has some imbalance such as when trying to put pants on when he is standing, but overall he is pleased with his progressive.  No further buckling spells or weakness.  No new complaints  BP is better controlled with medication changes per PCP.   UPDATE 02/01/2024:  He is here for hospital discharge follow-up. He was admitted to [facility] 5/18 - 5/21 with small right periatrial white matter manifesting with left side weakness, left hemianopia, and confusion (12/19/2023) while on DAPT.  Vessel imaging shows no extracranial stenosis, but there is intracranial stenosis with chronic occlusion of the right ACA.  No evidence of afib on loop recorder.  He was started on aspirin  and Brillinta x 4 weeks, and now on monotherapy with aspirin  as he failed DAPT.   He continues to to PT/OT.  His left side weakness has improved.  He has some weakness with fine motor tasks, but overall doing well.   Medications:  Current Outpatient Medications on File Prior to Visit  Medication Sig Dispense Refill   allopurinol  (ZYLOPRIM ) 300 MG tablet Take 1 tablet (300 mg total) by mouth daily. 90 tablet 3   amantadine  (SYMMETREL ) 100 MG capsule Take 1 capsule (100 mg total) by mouth daily. 90 capsule 0   aspirin  EC 81 MG tablet Take 1 tablet (81 mg total) by mouth daily. 90 tablet 3   atorvastatin  (LIPITOR ) 80 MG tablet Take 1 tablet (80 mg total) by mouth at bedtime.     Cholecalciferol  (  VITAMIN D  PO) Take 1 capsule by mouth daily.     escitalopram  (LEXAPRO ) 5 MG tablet Take 1 tablet (5 mg total) by mouth daily. 90 tablet 3   losartan  (COZAAR ) 25 MG tablet TAKE 1 TABLET DAILY 90 tablet 2   Misc Natural Products (BLACK CHERRY CONCENTRATE PO) Take 1 tablet by mouth at bedtime.     Multiple Vitamins-Minerals (OCUVITE PO) Take 1 capsule by mouth daily.     nitroGLYCERIN  (NITROSTAT ) 0.4 MG SL tablet Place 1 tablet (0.4 mg total) under the tongue every 5 (five)  minutes as needed for chest pain. 25 tablet 2   pantoprazole  (PROTONIX ) 40 MG tablet Take 1 tablet (40 mg total) by mouth daily. 90 tablet 3   polycarbophil (FIBERCON) 625 MG tablet Take 1 tablet (625 mg total) by mouth daily. 30 tablet 0   polyethylene glycol powder (GLYCOLAX /MIRALAX ) 17 GM/SCOOP powder Take 17 g by mouth daily as needed for mild constipation. 238 g 0   polyvinyl alcohol  (LIQUIFILM TEARS) 1.4 % ophthalmic solution Place 1 drop into both eyes daily as needed for dry eyes.     QUEtiapine  (SEROQUEL ) 25 MG tablet Take 0.5-1 tablets (12.5-25 mg total) by mouth at bedtime.     sodium chloride  (OCEAN) 0.65 % SOLN nasal spray Place 1 spray into both nostrils as needed for congestion.     tamsulosin  (FLOMAX ) 0.4 MG CAPS capsule Take 1 capsule (0.4 mg total) by mouth daily after supper.     ticagrelor  (BRILINTA ) 90 MG TABS tablet Take 1 tablet (90 mg total) by mouth 2 (two) times daily. 37 tablet 0   No current facility-administered medications on file prior to visit.    Allergies: No Known Allergies  Vital Signs:  BP 109/69   Pulse 90   Ht 5' 10 (1.778 m)   Wt 209 lb (94.8 kg)   SpO2 98%   BMI 29.99 kg/m    Neurological Exam: MENTAL STATUS including orientation to time, place, person, recent and remote memory, attention span and concentration, language, and fund of knowledge is normal.  Speech is not dysarthric.  CRANIAL NERVES:  Pupils equal round and reactive to light.  Normal conjugate, extra-ocular eye movements in all directions of gaze.  No ptosis.  Face is symmetric. Palate elevates symmetrically.  Tongue is midline.  MOTOR:  Motor strength is 5/5 in all extremities, including left leg.  No atrophy, fasciculations or abnormal movements.  No pronator drift.  Tone is normal.  There is mild dysmetria with finger to nose testing on the left.   MSRs:  Reflexes are 2+/4 throughout.  SENSORY:  Intact to vibration throughout.  COORDINATION/GAIT:  Normal finger-to-  nose-finger. Finger tapping and heel tapping slowed bilaterally.  Gait is mildly wide based, stable, unassisted.   Data: MRI brain wo contrast 12/19/2023: 1. 3 mm acute infarct within the right periatrial white matter. 2. Background parenchymal atrophy, chronic small vessel disease and redemonstrated chronic infarcts as described.   CTA head and neck 12/19/2023: CTA neck:  1. The common carotid and internal carotid arteries are patent within the neck without stenosis. Mild atherosclerotic plaque bilaterally, as described. 2. The vertebral arteries are patent within the neck without stenosis or significant atherosclerotic disease. 3. Aortic Atherosclerosis (ICD10-I70.0).   CTA head: 1. Focal occlusion versus severe, near-occlusive stenosis of the right anterior cerebral artery A3 segment. It is difficult to determine if this was present on the prior CTA of 12/22/2022 (due to the degree of motion degradation on the  prior exam). Immediately downstream from this, additional severe stenosis within the right anterior cerebral artery A3 segment. 2. Background intracranial atherosclerotic disease. Most notably, calcified plaque results in up to moderate stenosis of the paraclinoid right internal carotid artery.   CTA head and neck 12/22/2022: No emergent large vessel occlusion or hemodynamically significant stenosis of the head or neck.  Aortic atherosclerosis (ICD10-I70.0).   MRI brain wo contrast 12/21/2022: Intermediate sized area of acute ischemia within the right anterior cerebral artery territory. No hemorrhage or mass effect.  Lab Results  Component Value Date   CHOL 100 12/20/2023   HDL 34 (L) 12/20/2023   LDLCALC 51 12/20/2023   LDLDIRECT 97.0 10/01/2020   TRIG 76 12/20/2023   CHOLHDL 2.9 12/20/2023   Lab Results  Component Value Date   HGBA1C 6.7 (H) 11/30/2023   Total time spent reviewing records, interview, history/exam, documentation, and coordination of care on day of  encounter:  30 minutes    Thank you for allowing me to participate in patient's care.  If I can answer any additional questions, I would be pleased to do so.    Sincerely,    Asahd Can K. Tobie, DO

## 2024-02-07 ENCOUNTER — Ambulatory Visit

## 2024-02-07 DIAGNOSIS — G459 Transient cerebral ischemic attack, unspecified: Secondary | ICD-10-CM

## 2024-02-07 LAB — CUP PACEART REMOTE DEVICE CHECK
Date Time Interrogation Session: 20250706233623
Implantable Pulse Generator Implant Date: 20240521

## 2024-02-08 ENCOUNTER — Ambulatory Visit: Admitting: Physical Therapy

## 2024-02-08 ENCOUNTER — Encounter

## 2024-02-10 ENCOUNTER — Ambulatory Visit: Payer: Self-pay | Admitting: Internal Medicine

## 2024-02-14 ENCOUNTER — Other Ambulatory Visit: Payer: Self-pay | Admitting: Family Medicine

## 2024-02-15 ENCOUNTER — Ambulatory Visit: Admitting: Physical Therapy

## 2024-02-15 ENCOUNTER — Ambulatory Visit: Attending: Physical Medicine and Rehabilitation | Admitting: Speech Pathology

## 2024-02-15 ENCOUNTER — Encounter: Payer: Self-pay | Admitting: Speech Pathology

## 2024-02-15 VITALS — BP 122/78 | HR 80

## 2024-02-15 DIAGNOSIS — R2681 Unsteadiness on feet: Secondary | ICD-10-CM | POA: Insufficient documentation

## 2024-02-15 DIAGNOSIS — M6281 Muscle weakness (generalized): Secondary | ICD-10-CM

## 2024-02-15 DIAGNOSIS — R41841 Cognitive communication deficit: Secondary | ICD-10-CM | POA: Insufficient documentation

## 2024-02-15 DIAGNOSIS — R2689 Other abnormalities of gait and mobility: Secondary | ICD-10-CM

## 2024-02-15 NOTE — Therapy (Signed)
 OUTPATIENT SPEECH LANGUAGE PATHOLOGY TREATMENT   Patient Name: Tyler Guerra. MRN: [id number] DOB:1950/11/21, 73 y.o., male Today's Date: 02/15/2024  PCP: Tyler Arlyss RAMAN MD REFERRING PROVIDER: Maurice Sharlet RAMAN, PA-C  END OF SESSION:  End of Session - 02/15/24 1144     Visit Number 2    Date for SLP Re-Evaluation 03/20/24    Authorization Type Medicare A&B/ BCBS    SLP Start Time 1145    SLP Stop Time  1230    SLP Time Calculation (min) 45 min    Activity Tolerance Patient tolerated treatment well          Past Medical History:  Diagnosis Date   Anxiety state 03/16/2016   Aortic stenosis    Mild to moderate by echo 12/2023 with mean aortic valve gradient 12 mmHg   Aphasia 01/26/2017   Benign localized prostatic hyperplasia with lower urinary tract symptoms (LUTS)    BPV (benign positional vertigo) 11/02/2014   Carotid artery stenosis    PER DUPLEX 10-22-2015  BILATERAL ICA 1-39%   Cataract    Coronary artery disease    CABG 2000 with LIMA> LAD, SVG > RCA and free radial > OM. S/P NSTEMI 05/2021 cath with luiminal Irrg SVG>RCA, occluded prox RCA, patent free radial>OM, occluded oLCx, occluded o1   Depression 01/26/2017   Diabetes mellitus without complication (HCC) 03/02/2018   GERD 07/19/2010   Qualifier: Diagnosis of  By: Cleatus MD, Arlyss     GERD (gastroesophageal reflux disease)    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   Gout    Gout 07/18/2010   Qualifier: Diagnosis of  By: Cleatus MD, Arlyss     H/O eye injury    chronic changes to left eye after injury   Hand weakness 03/02/2018   Hearing loss 04/21/2013   History of TIA (transient ischemic attack)    03/ 2017  no residual after brief episode loss peripheral vision   HOH (hard of hearing)    Hyperglycemia 05/17/2016   Hyperlipemia    Hyperlipidemia 07/18/2010   Qualifier: Diagnosis of  By: Cleatus MD, Graham     Hypertension    HYPERTENSION, BENIGN ESSENTIAL 07/18/2010   Qualifier: Diagnosis of  By: Cleatus MD,  Graham     Hypokalemia 01/26/2017   Knee pain 03/13/2019   Left shoulder pain 09/04/2018   Pneumothorax 10/11/2019   RIGHT    Prostate cancer (HCC) UROLOGIST-  DR GRAPEY/  ONCOLOGIST-  DR MANNING   dx 2015--- Stage T1c, Gleason 3+4, PSA 4.03, vol 44cc   PVC's (premature ventricular contractions)    Rash and nonspecific skin eruption 10/14/2015   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   S/P CABG x 05 Dec 1998   Skin lesion 10/14/2015   Stroke [facility])    TIA (transient ischemic attack) 10/14/2015   Ventral hernia 10/26/2011   Wears glasses    Past Surgical History:  Procedure Laterality Date   APPENDECTOMY  1975   CARDIAC CATHETERIZATION  12-22-2001   dr victory sharps   severe native vessel disease mLAD 60-70%,  total occulsion pCFX  and pRCA/  widely patent saphenous vein , free radial , and LIMA grafts/  minminal lv dysfunction, ef 60%   CHEST TUBE INSERTION Right 10/11/2019   CORONARY ARTERY BYPASS GRAFT  12/1998   LIMA to LAD,  SVG to PDA and Diagonal, Free radial graft to OM   CORONARY STENT INTERVENTION N/A 05/26/2021   Procedure: CORONARY STENT INTERVENTION;  Surgeon: Wendel,  Lurena POUR, MD;  Location: [facility];  Service: Cardiovascular;  Laterality: N/A;   Exericse treadmill test  Guerra one 01-03-2014  dr victory sharps   normal exercise tolerance w/ hypertensive repsonse,  no ischemic EKG changes, appropriate HR response & recovery ([facility] TM score 9;  Low Risk , PVC's w/ exertion)   EYE SURGERY     FRACTURE SURGERY     LEFT HEART CATH AND CORS/GRAFTS ANGIOGRAPHY N/A 05/26/2021   Procedure: LEFT HEART CATH AND CORS/GRAFTS ANGIOGRAPHY;  Surgeon: Wendel Lurena POUR, MD;  Location: [facility];  Service: Cardiovascular;  Laterality: N/A;   LOOP RECORDER INSERTION N/A 12/22/2022   Procedure: LOOP RECORDER INSERTION;  Surgeon: Tyler Guerra Danelle ORN, MD;  Location: [facility];  Service: Cardiovascular;  Laterality: N/A;   PROSTATE BIOPSY     RADIOACTIVE SEED  IMPLANT N/A 01/13/2016   Procedure: RADIOACTIVE SEED IMPLANT/BRACHYTHERAPY IMPLANT;  Surgeon: Alm Fragmin, MD;  Location: [facility];  Service: Urology;  Laterality: N/A;   Patient Active Problem List   Diagnosis Date Noted   Insomnia 01/05/2024   Stroke, 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   Diabetes mellitus without complication (HCC) 12/05/2023   Health care maintenance 12/01/2023   Acute CVA (cerebrovascular accident) (HCC) 12/22/2022   History of CVA (cerebrovascular accident) 12/22/2022   Constipation 03/04/2022   CAD (coronary artery disease) 05/24/2021   NSTEMI (non-ST elevated myocardial infarction) (HCC) 05/24/2021   Pneumothorax 10/11/2019   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   Knee joint symptom 03/13/2019   Left shoulder pain 09/04/2018   Anxiety state 03/16/2016   Pseudophakia of both eyes 02/24/2016   TIA (transient ischemic attack) 10/14/2015   BPV (benign positional vertigo) 11/02/2014   Advance care planning 05/13/2014   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   Hearing loss 04/21/2013   Ventral hernia 10/26/2011   Medicare annual wellness visit, subsequent 10/26/2011   Prostate cancer (HCC) 10/26/2011   Coronary atherosclerosis 07/19/2010   GERD 07/19/2010   Hyperlipidemia 07/18/2010   Gout 07/18/2010   Hypertension 07/18/2010    ONSET DATE: 12/28/2023 (referral date)   REFERRING DIAG: I63.9 (ICD-10-CM) - Acute CVA (cerebrovascular accident)  THERAPY DIAG: Cognitive communication deficit  Rationale for Evaluation and Treatment: Rehabilitation  SUBJECTIVE:   SUBJECTIVE STATEMENT: He is better than when he say Tyler Guerra Pt accompanied by: significant other, Tyler Guerra      PAIN:  Are you having pain? No   PATIENT GOALS: Return to baseline                                                                                                                            TREATMENT DATE:   02/15/24: Tyler Guerra and [name] endorse overall  cognitive improvement. He hosted his [place], spoke to everyone and lead prayers successfully. At this time, he is writing on his calendar after events happen. For example, he said he will go home and write down that he came  to PT with Tyler Guerra. We generated strategy of writing down weekly appointments and events on Sunday night with Tyler Guerra's help to be ahead on planning for weekly events. Tyler Guerra endorsed he used a Pensions consultant to plan ahead when he was working. They agree to keep 1-2 weeks ahead in planner. Tyler Guerra reports that Tyler Guerra is recalling his weekly tasks of managing the households trash cans in each room, taking out trash to the curb and bringing it back as well as managing dog yard waste and household packages with rare min reminders from her. We discussed using planner as a to do list as well, if they find he is forgetting to complete any household chores. At this time, they report that processing of conversation and information has improved. See Patient instructions.   01/24/24: ST evaluation and POC complete. Utilizing some cognitive compensations, such as internal repetition and writing to-do lists to support cognition at home. Endorsed use of daily schedule in CIR but not currently utilizing at home. Recommended re-starting this practice given overt benefit. Pt and wife verbalized understanding and agreement.     PATIENT EDUCATION: Education details: see above Person educated: Patient and Spouse Education method: Explanation Education comprehension: verbalized understanding and needs further education   GOALS: Goals reviewed with patient? Yes  SHORT TERM GOALS: Target date: 02/21/2024  Pt will complete daily schedule x2 opportunities given rare min A Baseline: Goal status: ONGOING  2.  Pt will utilize strategies to aid successful completion of novel task/routine given occasional min A Baseline:  Goal status: ONGOING  3.  Pt will self-advocate for additional  processing time for x2 opportunities given rare min A  Baseline:  Goal status: ONGOING   LONG TERM GOALS: Target date: 03/20/2024  Pt will utilize written/visual cognitive supports (to-list, daily schedule) > 1 week given rare min A  Baseline:  Goal status: ONGOING  2.  Pt will carryover cognitive compensations to complete novel household tasks given rare min A  Baseline:  Goal status: ONGOING  3.  Pt and caregiver will report improved cognitive linguistic functioning by LTG date Baseline:  Goal status: ONGOING   ASSESSMENT:  CLINICAL IMPRESSION: Patient is a 73 y.o. M who was seen today for ST evaluation s/p stroke. Pt is accompanied by wife, Tyler Guerra. Pt questioned age-related recall challenges but wife indicated decreased processing speed and some difficulty problem solving more novel situations since most recent stroke. Completed majority of executive functioning subtests on CLQT, which revealed significant difficulty recalling and completing multi-step instructions, reduced problem solving, and decreased error awareness. Given change in baseline functioning and functional impact on daily tasks, pt would benefit from skilled ST intervention to optimize return to baseline and increased functional independence.   OBJECTIVE IMPAIRMENTS: include attention, memory, awareness, and executive functioning. These impairments are limiting patient from household responsibilities. Factors affecting potential to achieve goals and functional outcome are previous level of function.. Patient will benefit from skilled SLP services to address above impairments and improve overall function.  REHAB POTENTIAL: Good  PLAN:  SLP FREQUENCY: 1x/week  SLP DURATION: 8 weeks  PLANNED INTERVENTIONS: Environmental controls, Cognitive reorganization, Functional tasks, Multimodal communication approach, SLP instruction and feedback, Compensatory strategies, Patient/family education, [phone] Treatment of speech  (30 or 45 min) , and [id number]- Speech Eval Sound Prod, Artic, Phon, Eval Compre, Express    Sriansh Farra, Leita Caldron, CCC-SLP 02/15/2024, 12:26 PM

## 2024-02-15 NOTE — Therapy (Signed)
 OUTPATIENT PHYSICAL THERAPY NEURO TREATMENT   Patient Name: Tyler Guerra. MRN: [id number] DOB:[birth year], 73 y.o., male 84 Date: 02/15/2024   PCP: Cleatus Arlyss RAMAN, MD  REFERRING PROVIDER: Maurice Sharlet RAMAN DEVONNA    END OF SESSION:  PT End of Session - 02/15/24 1103     Visit Number 4    Number of Visits 5    Date for PT Re-Evaluation 03/13/24   due to potential delay in scheduling   PT Start Time 1100    PT Stop Time 1143    PT Time Calculation (min) 43 min    Equipment Utilized During Treatment Gait belt    Activity Tolerance Patient tolerated treatment well    Behavior During Therapy [facility] for tasks assessed/performed           Past Medical History:  Diagnosis Date   Anxiety state 03/16/2016   Aortic stenosis    Mild to moderate by echo 12/2023 with mean aortic valve gradient 12 mmHg   Aphasia 01/26/2017   Benign localized prostatic hyperplasia with lower urinary tract symptoms (LUTS)    BPV (benign positional vertigo) 11/02/2014   Carotid artery stenosis    PER DUPLEX 10-22-2015  BILATERAL ICA 1-39%   Cataract    Coronary artery disease    CABG 2000 with LIMA> LAD, SVG > RCA and free radial > OM. S/P NSTEMI 05/2021 cath with luiminal Irrg SVG>RCA, occluded prox RCA, patent free radial>OM, occluded oLCx, occluded o1   Depression 01/26/2017   Diabetes mellitus without complication (HCC) 03/02/2018   GERD 07/19/2010   Qualifier: Diagnosis of  By: Cleatus MD, Arlyss     GERD (gastroesophageal reflux disease)    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   Gout    Gout 07/18/2010   Qualifier: Diagnosis of  By: Cleatus MD, Arlyss     H/O eye injury    chronic changes to left eye after injury   Hand weakness 03/02/2018   Hearing loss 04/21/2013   History of TIA (transient ischemic attack)    03/ 2017  no residual after brief episode loss peripheral vision   HOH (hard of hearing)    Hyperglycemia 05/17/2016   Hyperlipemia    Hyperlipidemia 07/18/2010   Qualifier:  Diagnosis of  By: Cleatus MD, Graham     Hypertension    HYPERTENSION, BENIGN ESSENTIAL 07/18/2010   Qualifier: Diagnosis of  By: Cleatus MD, Graham     Hypokalemia 01/26/2017   Knee pain 03/13/2019   Left shoulder pain 09/04/2018   Pneumothorax 10/11/2019   RIGHT    Prostate cancer (HCC) UROLOGIST-  DR GRAPEY/  ONCOLOGIST-  DR MANNING   dx 2015--- Stage T1c, Gleason 3+4, PSA 4.03, vol 44cc   PVC's (premature ventricular contractions)    Rash and nonspecific skin eruption 10/14/2015   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   S/P CABG x 05 Dec 1998   Skin lesion 10/14/2015   Stroke [facility])    TIA (transient ischemic attack) 10/14/2015   Ventral hernia 10/26/2011   Wears glasses    Past Surgical History:  Procedure Laterality Date   APPENDECTOMY  1975   CARDIAC CATHETERIZATION  12-22-2001   dr victory sharps   severe native vessel disease mLAD 60-70%,  total occulsion pCFX  and pRCA/  widely patent saphenous vein , free radial , and LIMA grafts/  minminal lv dysfunction, ef 60%   CHEST TUBE INSERTION Right 10/11/2019   CORONARY ARTERY BYPASS GRAFT  12/1998  LIMA to LAD,  SVG to PDA and Diagonal, Free radial graft to OM   CORONARY STENT INTERVENTION N/A 05/26/2021   Procedure: CORONARY STENT INTERVENTION;  Surgeon: Wendel Lurena POUR, MD;  Location: [facility];  Service: Cardiovascular;  Laterality: N/A;   Exericse treadmill test  last one 01-03-2014  dr victory sharps   normal exercise tolerance w/ hypertensive repsonse,  no ischemic EKG changes, appropriate HR response & recovery ([facility] TM score 9;  Low Risk , PVC's w/ exertion)   EYE SURGERY     FRACTURE SURGERY     LEFT HEART CATH AND CORS/GRAFTS ANGIOGRAPHY N/A 05/26/2021   Procedure: LEFT HEART CATH AND CORS/GRAFTS ANGIOGRAPHY;  Surgeon: Wendel Lurena POUR, MD;  Location: [facility] CV [facility];  Service: Cardiovascular;  Laterality: N/A;   LOOP RECORDER INSERTION N/A 12/22/2022   Procedure: LOOP RECORDER INSERTION;   Surgeon: Waddell Danelle ORN, MD;  Location: [facility];  Service: Cardiovascular;  Laterality: N/A;   PROSTATE BIOPSY     RADIOACTIVE SEED IMPLANT N/A 01/13/2016   Procedure: RADIOACTIVE SEED IMPLANT/BRACHYTHERAPY IMPLANT;  Surgeon: Alm Fragmin, MD;  Location: [facility];  Service: Urology;  Laterality: N/A;   Patient Active Problem List   Diagnosis Date Noted   Insomnia 01/05/2024   Stroke, 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   Diabetes mellitus without complication (HCC) 12/05/2023   Health care maintenance 12/01/2023   Acute CVA (cerebrovascular accident) (HCC) 12/22/2022   History of CVA (cerebrovascular accident) 12/22/2022   Constipation 03/04/2022   CAD (coronary artery disease) 05/24/2021   NSTEMI (non-ST elevated myocardial infarction) (HCC) 05/24/2021   Pneumothorax 10/11/2019   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   Knee joint symptom 03/13/2019   Left shoulder pain 09/04/2018   Anxiety state 03/16/2016   Pseudophakia of both eyes 02/24/2016   TIA (transient ischemic attack) 10/14/2015   BPV (benign positional vertigo) 11/02/2014   Advance care planning 05/13/2014   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   Hearing loss 04/21/2013   Ventral hernia 10/26/2011   Medicare annual wellness visit, subsequent 10/26/2011   Prostate cancer (HCC) 10/26/2011   Coronary atherosclerosis 07/19/2010   GERD 07/19/2010   Hyperlipidemia 07/18/2010   Gout 07/18/2010   Hypertension 07/18/2010    ONSET DATE: 12/28/2023  REFERRING DIAG: I63.9 (ICD-10-CM) - Acute CVA (cerebrovascular accident) (HCC)   THERAPY DIAG:  Muscle weakness (generalized)  Unsteadiness on feet  Other abnormalities of gait and mobility  Rationale for Evaluation and Treatment: Rehabilitation  SUBJECTIVE:  SUBJECTIVE STATEMENT:  Pt reports he is doing well, no falls since last visit. No pain today. Pt reports that his exercises are going well, he is riding his exercise bike.  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 CT perfusion showed no core infarct. MRI brain showed 3 mm infarct right periatrial white matter with background parenchymal atrophy. EEG was negative for seizures and was suggestive of encephalopathy   Received inpatient rehab and was discharged 12/30/23  PAIN:  Are you having pain? No  Vitals:   02/15/24 1108  BP: 122/78  Pulse: 80      PRECAUTIONS: Fall  FALLS: Has patient fallen in last 6 months? No  LIVING ENVIRONMENT: Lives with: lives with their spouse and family close by Lives in: House/apartment Stairs: Yes: Internal: 12 steps; on right going up, level entry into house  Has following equipment at home: Single point cane, Walker - 2 wheeled, and reports needs to put some   PLOF: Independent  PATIENT GOALS: Wants to get back his normal balance, just wants any improvement  OBJECTIVE:  Note: Objective measures were completed at Evaluation unless otherwise noted.  DIAGNOSTIC FINDINGS: MRI brain 12/19/23 IMPRESSION: 1. 3 mm acute infarct within the right periatrial white matter. 2. Background parenchymal atrophy, chronic small vessel disease and redemonstrated chronic infarcts as described.  COGNITION: Overall cognitive status: Impaired   SENSATION: WFL Pt denies numbness/tingling in BLE   COORDINATION: Heel to shin: WNL bilat    POSTURE: rounded shoulders and  forward head   LOWER EXTREMITY MMT:    MMT Right Eval Left Eval  Hip flexion 5 5  Hip extension    Hip abduction    Hip adduction    Hip internal rotation    Hip external rotation    Knee flexion 5 5  Knee extension 5 5  Ankle dorsiflexion 5 4+  Ankle plantarflexion    Ankle inversion    Ankle eversion    (Blank rows = not tested)  BED MOBILITY:  Pt denies difficulties   TRANSFERS: Sit to stand: Complete Independence  Assistive device utilized: None     Stand to sit: Complete Independence  Assistive device utilized: None      Performs with no UE support   STAIRS:  Level of Assistance: SBA  Stair Negotiation Technique: Alternating Pattern  Forwards with Single Rail on Right  Number of Stairs: 4   Height of Stairs: 6  Comments: when trying to descend without a railing, pt performs with step to pattern    GAIT: Gait pattern: step through pattern and decreased stride length Distance walked: Clinic distances  Assistive device utilized: None Level of assistance: Modified independence Slightly bilateral external rotation noted during gait    FUNCTIONAL TESTS:  5 times sit to stand: 11.5 seconds no UE support  30 seconds chair stand test: 12 sit <> stands with no UE support (<12 below normal for age) 10 meter walk test: 11.6 seconds no AD = 2.83 ft/sec   SLS: RLE: 7 seconds, LLE: 1-2 seconds      [facility] PT Assessment - 02/15/24 1117       Functional Gait  Assessment  Gait assessed  Yes    Gait Level Surface Walks 20 ft in less than 7 sec but greater than 5.5 sec, uses assistive device, slower speed, mild gait deviations, or deviates 6-10 in outside of the 12 in walkway width.    Change in Gait Speed Able to smoothly change walking speed without loss of balance or gait deviation. Deviate no more than 6 in outside of the 12 in walkway width.    Gait with Horizontal Head Turns Performs head turns smoothly with slight change in gait velocity (eg, minor disruption to  smooth gait path), deviates 6-10 in outside 12 in walkway width, or uses an assistive device.    Gait with Vertical Head Turns Performs task with slight change in gait velocity (eg, minor disruption to smooth gait path), deviates 6 - 10 in outside 12 in walkway width or uses assistive device    Gait and Pivot Turn Pivot turns safely within 3 sec and stops quickly with no loss of balance.    Step Over Obstacle Is able to step over 2 stacked shoe boxes taped together (9 in total height) without changing gait speed. No evidence of imbalance.    Gait with Narrow Base of Support Is able to ambulate for 10 steps heel to toe with no staggering.    Gait with Eyes Closed Walks 20 ft, uses assistive device, slower speed, mild gait deviations, deviates 6-10 in outside 12 in walkway width. Ambulates 20 ft in less than 9 sec but greater than 7 sec.    Ambulating Backwards Walks 20 ft, no assistive devices, good speed, no evidence for imbalance, normal gait    Steps Alternating feet, must use rail.    Total Score 25    FGA comment: 25/50, low fall risk                                                                                                                                     TREATMENT DATE:   Vitals:   02/15/24 1108  BP: 122/78  Pulse: 80   Assessed in LUE at rest prior to intervention. Vitals WNL.  NMR: To work on cognitive dual tasking: Mercer toss to self with gait 4 x 25 ft +naming foods (repeats items, difficulty naming items) Mercer toss to self with gait x 115 ft +naming animals (again repeats items, difficulty coming up with names, stops walking at several points) To work on balance reactions: Lateral sidestepping along purple beam in // bars (no UE support) 4 x 10 ft L/R Improvement in performance as task progresses, good use of ankle and hip strategy   Physical Performance To initiate LTG assessment:  [facility] PT Assessment - 02/15/24 1117       Functional Gait  Assessment   Gait  assessed  Yes    Gait Level Surface Walks 20 ft in less than 7 sec but greater than 5.5 sec, uses assistive  device, slower speed, mild gait deviations, or deviates 6-10 in outside of the 12 in walkway width.    Change in Gait Speed Able to smoothly change walking speed without loss of balance or gait deviation. Deviate no more than 6 in outside of the 12 in walkway width.    Gait with Horizontal Head Turns Performs head turns smoothly with slight change in gait velocity (eg, minor disruption to smooth gait path), deviates 6-10 in outside 12 in walkway width, or uses an assistive device.    Gait with Vertical Head Turns Performs task with slight change in gait velocity (eg, minor disruption to smooth gait path), deviates 6 - 10 in outside 12 in walkway width or uses assistive device    Gait and Pivot Turn Pivot turns safely within 3 sec and stops quickly with no loss of balance.    Step Over Obstacle Is able to step over 2 stacked shoe boxes taped together (9 in total height) without changing gait speed. No evidence of imbalance.    Gait with Narrow Base of Support Is able to ambulate for 10 steps heel to toe with no staggering.    Gait with Eyes Closed Walks 20 ft, uses assistive device, slower speed, mild gait deviations, deviates 6-10 in outside 12 in walkway width. Ambulates 20 ft in less than 9 sec but greater than 7 sec.    Ambulating Backwards Walks 20 ft, no assistive devices, good speed, no evidence for imbalance, normal gait    Steps Alternating feet, must use rail.    Total Score 25    FGA comment: 25/50, low fall risk           PATIENT EDUCATION: Education details: Continue HEP, results of OM and functional implications, PT POC with plan to d/c next visit Person educated: Patient and Spouse Education method: Explanation Education comprehension: verbalized understanding  HOME EXERCISE PROGRAM: Access Code: [email or web address] URL: [email or web address] Date:  01/24/2024 Prepared by: Sheffield Senate  Initiated HEP for balance:   Exercises - Tandem Walking with Counter Support  - 1-2 x daily - 5 x weekly - 3 sets - Standing Marching  - 1-2 x daily - 5 x weekly - 3 sets - Standing Single Leg Stance with Counter Support  - 1-2 x daily - 5 x weekly - 3 sets - 15 hold - Standing Balance with Eyes Closed on Foam  - 1-2 x daily - 5 x weekly - 3 sets - 30 hold  GOALS: Goals reviewed with patient? Yes  SHORT TERM GOALS: ALL STGS = LTGS   LONG TERM GOALS: Target date: 02/22/2024 (updated to match last scheduled visit within POC)  Pt will be independent with final HEP for strength/balance in order to build upon functional gains made in therapy. Baseline:  Goal status: INITIAL  2.  Pt will improve FGA to at least a 28/30 in order to demo decr fall risk. Baseline: 25/30, 25/30 (7/15) Goal status: IN PROGRESS  3.  Pt will improve 30 second chair stand to 14 in order to demo improved functional strength/endurance  Baseline: 12 sit <> stands Goal status: INITIAL  4.  Pt will improve SLS consistently on LLE to at least 5 seconds for improved stability for balance Baseline: 1-2 seconds  Goal status: INITIAL    ASSESSMENT:  CLINICAL IMPRESSION: Emphasis of skilled PT session on initiating goal assessment and updating goal date to match last scheduled visit within cert. Pt showed no improvement in his FGA score but also did  not score any worse on the test, still a low fall risk based on score. He continues to struggle significantly with dual tasking and cognitive dual tasking as well. Plan to assess remaining LTG next visit and d/c from OPPT. Continue POC.     OBJECTIVE IMPAIRMENTS: Abnormal gait, decreased activity tolerance, decreased balance, decreased cognition, decreased strength, and postural dysfunction.   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   REHAB POTENTIAL: Good  CLINICAL DECISION MAKING: Stable/uncomplicated  EVALUATION COMPLEXITY: Low  PLAN:  PT FREQUENCY: 1x/week  PT DURATION: 8 weeks - due to potential delay in scheduling - only anticipate 4 weeks   PLANNED INTERVENTIONS: [id number]- PT Re-evaluation, 97110-Therapeutic exercises, 97530- Therapeutic activity, 97112- Neuromuscular re-education, 97535- Self Care, [id number]- Manual therapy, [phone]- Gait training, Patient/Family education, Balance training, Stair training, and Vestibular training  PLAN FOR NEXT SESSION: assess LTG and d/c   Waddell Southgate, PT Waddell Southgate, PT, DPT, CSRS  02/15/2024, 11:51 AM

## 2024-02-15 NOTE — Patient Instructions (Addendum)
   Use the weekly planner - get with Corean and plan out your week adding appointments, social events, birthdays, hair appointments etc  No need to be too detailed - PT - 2:00 - don't need to add names of everyone - keep it clear  Color code [name]'s vs your appointments  Dr. Loreli 3:30 - physical  Add to do's as needed - sounds like he is managing his PT exercises, the mail and trash without writing it down, but if he ever needs to start writing down these, add them and have him cross them out when he is done to track it

## 2024-02-21 ENCOUNTER — Ambulatory Visit: Payer: Medicare Other

## 2024-02-21 VITALS — BP 122/78 | Ht 70.0 in | Wt 210.0 lb

## 2024-02-21 DIAGNOSIS — Z0000 Encounter for general adult medical examination without abnormal findings: Secondary | ICD-10-CM | POA: Diagnosis not present

## 2024-02-21 NOTE — Therapy (Deleted)
 OUTPATIENT SPEECH LANGUAGE PATHOLOGY TREATMENT   Patient Name: Tyler Guerra. MRN: [id number] DOB:[birth year], 73 y.o., male 76 Date: 02/21/2024  PCP: Tyler Tyler Guerra RAMAN MD REFERRING PROVIDER: Maurice Sharlet RAMAN, PA-C  END OF SESSION:    Past Medical History:  Diagnosis Date   Anxiety state 03/16/2016   Aortic stenosis    Mild to moderate by echo 12/2023 with mean aortic valve gradient 12 mmHg   Aphasia 01/26/2017   Benign localized prostatic hyperplasia with lower urinary tract symptoms (LUTS)    BPV (benign positional vertigo) 11/02/2014   Carotid artery stenosis    PER DUPLEX 10-22-2015  BILATERAL ICA 1-39%   Cataract    Coronary artery disease    CABG 2000 with LIMA> LAD, SVG > RCA and free radial > OM. S/P NSTEMI 05/2021 cath with luiminal Irrg SVG>RCA, occluded prox RCA, patent free radial>OM, occluded oLCx, occluded o1   Depression 01/26/2017   Diabetes mellitus without complication (HCC) 03/02/2018   GERD 07/19/2010   Qualifier: Diagnosis of  By: Cleatus MD, Tyler Guerra     GERD (gastroesophageal reflux disease)    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   Gout    Gout 07/18/2010   Qualifier: Diagnosis of  By: Cleatus MD, Tyler Guerra     H/O eye injury    chronic changes to left eye after injury   Hand weakness 03/02/2018   Hearing loss 04/21/2013   History of TIA (transient ischemic attack)    03/ 2017  no residual after brief episode loss peripheral vision   HOH (hard of hearing)    Hyperglycemia 05/17/2016   Hyperlipemia    Hyperlipidemia 07/18/2010   Qualifier: Diagnosis of  By: Cleatus MD, Tyler Guerra     Hypertension    HYPERTENSION, BENIGN ESSENTIAL 07/18/2010   Qualifier: Diagnosis of  By: Cleatus MD, Tyler Guerra     Hypokalemia 01/26/2017   Knee pain 03/13/2019   Left shoulder pain 09/04/2018   Pneumothorax 10/11/2019   RIGHT    Prostate cancer (HCC) UROLOGIST-  Tyler Guerra/  ONCOLOGIST-  Tyler Guerra   dx 2015--- Stage T1c, Gleason 3+4, PSA 4.03, vol 44cc   PVC's (premature  ventricular contractions)    Rash and nonspecific skin eruption 10/14/2015   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   S/P CABG x 05 Dec 1998   Skin lesion 10/14/2015   Stroke [facility])    TIA (transient ischemic attack) 10/14/2015   Ventral hernia 10/26/2011   Wears glasses    Past Surgical History:  Procedure Laterality Date   APPENDECTOMY  1975   CARDIAC CATHETERIZATION  12-22-2001   Tyler [facility]   severe native vessel disease mLAD 60-70%,  total occulsion pCFX  and pRCA/  widely patent saphenous vein , free radial , and LIMA grafts/  minminal lv dysfunction, ef 60%   CHEST TUBE INSERTION Right 10/11/2019   CORONARY ARTERY BYPASS GRAFT  12/1998   LIMA to LAD,  SVG to PDA and Diagonal, Free radial graft to OM   CORONARY STENT INTERVENTION N/A 05/26/2021   Procedure: CORONARY STENT INTERVENTION;  Surgeon: Tyler Lurena POUR, MD;  Location: [facility];  Service: Cardiovascular;  Laterality: N/A;   Exericse treadmill test  last one 01-03-2014  Tyler [facility]   normal exercise tolerance w/ hypertensive repsonse,  no ischemic EKG changes, appropriate HR response & recovery ([facility] TM score 9;  Low Risk , PVC's w/ exertion)   EYE SURGERY     FRACTURE SURGERY  LEFT HEART CATH AND CORS/GRAFTS ANGIOGRAPHY N/A 05/26/2021   Procedure: LEFT HEART CATH AND CORS/GRAFTS ANGIOGRAPHY;  Surgeon: Thukkani, Arun K, MD;  Location: [facility];  Service: Cardiovascular;  Laterality: N/A;   LOOP RECORDER INSERTION N/A 12/22/2022   Procedure: LOOP RECORDER INSERTION;  Surgeon: Tyler Danelle ORN, MD;  Location: [facility];  Service: Cardiovascular;  Laterality: N/A;   PROSTATE BIOPSY     RADIOACTIVE SEED IMPLANT N/A 01/13/2016   Procedure: RADIOACTIVE SEED IMPLANT/BRACHYTHERAPY IMPLANT;  Surgeon: Tyler Fragmin, MD;  Location: [facility];  Service: Urology;  Laterality: N/A;   Patient Active Problem List   Diagnosis Date Noted   Insomnia 01/05/2024   Stroke,  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   Diabetes mellitus without complication (HCC) 12/05/2023   Health care maintenance 12/01/2023   Acute CVA (cerebrovascular accident) (HCC) 12/22/2022   History of CVA (cerebrovascular accident) 12/22/2022   Constipation 03/04/2022   CAD (coronary artery disease) 05/24/2021   NSTEMI (non-ST elevated myocardial infarction) (HCC) 05/24/2021   Pneumothorax 10/11/2019   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   Knee joint symptom 03/13/2019   Left shoulder pain 09/04/2018   Anxiety state 03/16/2016   Pseudophakia of both eyes 02/24/2016   TIA (transient ischemic attack) 10/14/2015   BPV (benign positional vertigo) 11/02/2014   Advance care planning 05/13/2014   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   Hearing loss 04/21/2013   Ventral hernia 10/26/2011   Medicare annual wellness visit, subsequent 10/26/2011   Prostate cancer (HCC) 10/26/2011   Coronary atherosclerosis 07/19/2010   GERD 07/19/2010   Hyperlipidemia 07/18/2010   Gout 07/18/2010   Hypertension 07/18/2010    ONSET DATE: 12/28/2023 (referral date)   REFERRING DIAG: I63.9 (ICD-10-CM) - Acute CVA (cerebrovascular accident)  THERAPY DIAG: No diagnosis found.  Rationale for Evaluation and Treatment: Rehabilitation  SUBJECTIVE:   SUBJECTIVE STATEMENT: Tyler Guerra is HOH at baseline and required intermittent repetition in quiet environment. Endorsed some use of memory strategies at home Pt accompanied by: significant other, Tyler Guerra   PERTINENT HISTORY: Pt is a 73 y/o male with PMH of HTN, prostate cancer, right ACA stroke in 12/2022, gout, CAD, anxiety, BPPV, CABG, and HOH who presented to [facility]23 with c/o left sided hemiparesis and R gaze preference with associated HA. In ED NIHSS 11, CT head negative, CTA with focal occlusion vs severe near occlusion of right ACA A3 segment. Febrile to 100.8 but no leukocytosis. MRI showed small stroke in the right parietal white matter. Neurology  recommendations for aspirin  and plavix  and loop recorder interrogation. EEG showed moderate diffuse encephalopathy but no seizures, loop showed no afib. Fever workup negative so broad spectrum abx stopped.   PAIN:  Are you having pain? No  FALLS: Has patient fallen in last 6 months?  No  LIVING ENVIRONMENT: Lives with: lives with their family Lives in: House/apartment  PLOF:  Level of assistance: Independent with ADLs, Independent with IADLs Employment: Retired  PATIENT GOALS: Return to baseline  OBJECTIVE:  Note: Objective measures were completed at Evaluation unless otherwise noted.  COGNITION: Overall cognitive status: Impaired Areas of impairment:  Attention: Impaired: Alternating, Divided Memory: Impaired: Working Teacher, music term Prospective Awareness: Impaired: Intellectual, Emergent, and Anticipatory Executive function: Impaired: Problem solving, Organization, Planning, Error awareness, Self-correction, and Slow processing Behavior: Poor frustration tolerance Functional deficits: Endorsed some challenges with short term memory and problem solving (I.e., opening dog gates). Wife indicated pt presenting with slower processing. Wife manages all iADLs since first stroke  COGNITIVE COMMUNICATION: Following directions: Follows multi-step commands inconsistently  Auditory comprehension: Impaired: HOH Verbal expression: WFL Functional communication: WFL  ORAL MOTOR EXAMINATION: Overall status: WFL  STANDARDIZED ASSESSMENTS: CLQT subtests completed: Symbol Cancellation: 8/10 Clock Drawing: 11/13 Symbol Trails: 4/10 Design Generation: 3/13   PATIENT REPORTED OUTCOME MEASURES (PROM): Not completed d/t time constraints                                                                                                                            TREATMENT DATE:  02/22/24:  01/24/24: ST evaluation and POC complete. Utilizing some cognitive compensations, such as internal  repetition and writing to-do lists to support cognition at home. Endorsed use of daily schedule in CIR but not currently utilizing at home. Recommended re-starting this practice given overt benefit. Pt and wife verbalized understanding and agreement.     PATIENT EDUCATION: Education details: see above Person educated: Patient and Spouse Education method: Explanation Education comprehension: verbalized understanding and needs further education   GOALS: Goals reviewed with patient? Yes  SHORT TERM GOALS: Target date: 02/21/2024  Pt will complete daily schedule x2 opportunities given rare min A Baseline: Goal status: ONGOING  2.  Pt will utilize strategies to aid successful completion of novel task/routine given occasional min A Baseline:  Goal status: ONGOING  3.  Pt will self-advocate for additional processing time for x2 opportunities given rare min A  Baseline:  Goal status: ONGOING   LONG TERM GOALS: Target date: 03/20/2024  Pt will utilize written/visual cognitive supports (to-list, daily schedule) > 1 week given rare min A  Baseline:  Goal status: ONGOING  2.  Pt will carryover cognitive compensations to complete novel household tasks given rare min A  Baseline:  Goal status: ONGOING  3.  Pt and caregiver will report improved cognitive linguistic functioning by LTG date Baseline:  Goal status: ONGOING   ASSESSMENT:  CLINICAL IMPRESSION: Patient is a 73 y.o. M who was seen today for ST tx s/p stroke. Pt is accompanied by wife, Corean. Pt questioned age-related recall challenges but wife indicated decreased processing speed and some difficulty problem solving more novel situations since most recent stroke. Completed majority of executive functioning subtests on CLQT, which revealed significant difficulty recalling and completing multi-step instructions, reduced problem solving, and decreased error awareness. Given change in baseline functioning and functional impact  on daily tasks, pt would benefit from skilled ST intervention to optimize return to baseline and increased functional independence.   OBJECTIVE IMPAIRMENTS: include attention, memory, awareness, and executive functioning. These impairments are limiting patient from household responsibilities. Factors affecting potential to achieve goals and functional outcome are previous level of function.. Patient will benefit from skilled SLP services to address above impairments and improve overall function.  REHAB POTENTIAL: Good  PLAN:  SLP FREQUENCY: 1x/week  SLP DURATION: 8 weeks  PLANNED INTERVENTIONS: Environmental controls, Cognitive reorganization, Functional tasks, Multimodal communication approach, SLP instruction and feedback, Compensatory strategies, Patient/family education, [phone] Treatment of  speech (30 or 45 min) , and [id number]- Speech [address] [place], Phon, Eval Compre, Express    Comer LILLETTE Louder, CCC-SLP 02/21/2024, 3:29 PM

## 2024-02-21 NOTE — Progress Notes (Signed)
 Because this visit was a virtual/telehealth visit,  certain criteria was not obtained, such a blood pressure, CBG if applicable, and timed get up and go. Any medications not marked as taking were not mentioned during the medication reconciliation part of the visit. Any vitals not documented were not able to be obtained due to this being a telehealth visit or patient was unable to self-report a recent blood pressure reading due to a lack of equipment at home via telehealth. Vitals that have been documented are verbally provided by the patient.  This visit was performed by a medical professional under my direct supervision. I was immediately available for consultation/collaboration. I have reviewed and agree with the Annual Wellness Visit documentation.  Subjective:   Tyler Guerra. is a 73 y.o. who presents for a Medicare Wellness preventive visit.  As a reminder, Annual Wellness Visits don't include a physical exam, and some assessments may be limited, especially if this visit is performed virtually. We may recommend an in-person follow-up visit with your provider if needed.  Visit Complete: Virtual I connected with  [name] DELENA Rhett Mickey. on 02/21/24 by a audio enabled telemedicine application and verified that I am speaking with the correct person using two identifiers.  Patient Location: Home  Provider Location: Home Office  I discussed the limitations of evaluation and management by telemedicine. The patient expressed understanding and agreed to proceed.  Vital Signs: Because this visit was a virtual/telehealth visit, some criteria may be missing or patient reported. Any vitals not documented were not able to be obtained and vitals that have been documented are patient reported.  VideoDeclined- This patient declined Librarian, academic. Therefore the visit was completed with audio only.  Persons Participating in Visit: Patient.  AWV Questionnaire: No: Patient  Medicare AWV questionnaire was not completed prior to this visit.  Cardiac Risk Factors include: advanced age (>22men, >29 women);hypertension;male gender;dyslipidemia;obesity (BMI >30kg/m2)     Objective:    Today's Vitals   02/21/24 1409  BP: 122/78  Weight: 210 lb (95.3 kg)  Height: 5' 10 (1.778 m)   Body mass index is 30.13 kg/m.     02/21/2024    2:15 PM 02/01/2024    1:36 PM 01/24/2024   11:00 AM 01/13/2024   11:07 AM 01/13/2024   10:21 AM 12/22/2023    6:00 PM 05/31/2023    9:36 AM  Advanced Directives  Does Patient Have a Medical Advance Directive? No No No No No No No  Would patient like information on creating a medical advance directive? No - Patient declined   Yes (MAU/Ambulatory/Procedural Areas - Information given) Yes (MAU/Ambulatory/Procedural Areas - Information given) Yes (Inpatient - patient requests chaplain consult to create a medical advance directive)     Current Medications (verified) Outpatient Encounter Medications as of 02/21/2024  Medication Sig   allopurinol  (ZYLOPRIM ) 300 MG tablet Take 1 tablet (300 mg total) by mouth daily.   amantadine  (SYMMETREL ) 100 MG capsule Take 1 capsule (100 mg total) by mouth daily.   aspirin  EC 81 MG tablet Take 1 tablet (81 mg total) by mouth daily.   atorvastatin  (LIPITOR ) 80 MG tablet TAKE 1 TABLET DAILY   Cholecalciferol  (VITAMIN D  PO) Take 1 capsule by mouth daily.   escitalopram  (LEXAPRO ) 5 MG tablet Take 1 tablet (5 mg total) by mouth daily.   losartan  (COZAAR ) 25 MG tablet TAKE 1 TABLET DAILY   Misc Natural Products (BLACK CHERRY CONCENTRATE PO) Take 1 tablet by mouth at bedtime.  Multiple Vitamins-Minerals (OCUVITE PO) Take 1 capsule by mouth daily.   nitroGLYCERIN  (NITROSTAT ) 0.4 MG SL tablet Place 1 tablet (0.4 mg total) under the tongue every 5 (five) minutes as needed for chest pain.   pantoprazole  (PROTONIX ) 40 MG tablet Take 1 tablet (40 mg total) by mouth daily.   polycarbophil (FIBERCON) 625 MG tablet  Take 1 tablet (625 mg total) by mouth daily.   polyethylene glycol powder (GLYCOLAX /MIRALAX ) 17 GM/SCOOP powder Take 17 g by mouth daily as needed for mild constipation.   polyvinyl alcohol  (LIQUIFILM TEARS) 1.4 % ophthalmic solution Place 1 drop into both eyes daily as needed for dry eyes.   QUEtiapine  (SEROQUEL ) 25 MG tablet Take 0.5-1 tablets (12.5-25 mg total) by mouth at bedtime.   sodium chloride  (OCEAN) 0.65 % SOLN nasal spray Place 1 spray into both nostrils as needed for congestion.   tamsulosin  (FLOMAX ) 0.4 MG CAPS capsule Take 1 capsule (0.4 mg total) by mouth daily after supper.   No facility-administered encounter medications on file as of 02/21/2024.    Allergies (verified) Patient has no known allergies.   History: Past Medical History:  Diagnosis Date   Anxiety state 03/16/2016   Aortic stenosis    Mild to moderate by echo 12/2023 with mean aortic valve gradient 12 mmHg   Aphasia 01/26/2017   Benign localized prostatic hyperplasia with lower urinary tract symptoms (LUTS)    BPV (benign positional vertigo) 11/02/2014   Carotid artery stenosis    PER DUPLEX 10-22-2015  BILATERAL ICA 1-39%   Cataract    Coronary artery disease    CABG 2000 with LIMA> LAD, SVG > RCA and free radial > OM. S/P NSTEMI 05/2021 cath with luiminal Irrg SVG>RCA, occluded prox RCA, patent free radial>OM, occluded oLCx, occluded o1   Depression 01/26/2017   Diabetes mellitus without complication (HCC) 03/02/2018   GERD 07/19/2010   Qualifier: Diagnosis of  By: Cleatus MD, Arlyss     GERD (gastroesophageal reflux disease)    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   Gout    Gout 07/18/2010   Qualifier: Diagnosis of  By: Cleatus MD, Arlyss     H/O eye injury    chronic changes to left eye after injury   Hand weakness 03/02/2018   Hearing loss 04/21/2013   History of TIA (transient ischemic attack)    03/ 2017  no residual after brief episode loss peripheral vision   HOH (hard of hearing)    Hyperglycemia  05/17/2016   Hyperlipemia    Hyperlipidemia 07/18/2010   Qualifier: Diagnosis of  By: Cleatus MD, Graham     Hypertension    HYPERTENSION, BENIGN ESSENTIAL 07/18/2010   Qualifier: Diagnosis of  By: Cleatus MD, Graham     Hypokalemia 01/26/2017   Knee pain 03/13/2019   Left shoulder pain 09/04/2018   Pneumothorax 10/11/2019   RIGHT    Prostate cancer (HCC) UROLOGIST-  DR GRAPEY/  ONCOLOGIST-  DR MANNING   dx 2015--- Stage T1c, Gleason 3+4, PSA 4.03, vol 44cc   PVC's (premature ventricular contractions)    Rash and nonspecific skin eruption 10/14/2015   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   S/P CABG x 05 Dec 1998   Skin lesion 10/14/2015   Stroke [facility])    TIA (transient ischemic attack) 10/14/2015   Ventral hernia 10/26/2011   Wears glasses    Past Surgical History:  Procedure Laterality Date   APPENDECTOMY  1975   CARDIAC CATHETERIZATION  12-22-2001   dr victory  smith   severe native vessel disease mLAD 60-70%,  total occulsion pCFX  and pRCA/  widely patent saphenous vein , free radial , and LIMA grafts/  minminal lv dysfunction, ef 60%   CHEST TUBE INSERTION Right 10/11/2019   CORONARY ARTERY BYPASS GRAFT  12/1998   LIMA to LAD,  SVG to PDA and Diagonal, Free radial graft to OM   CORONARY STENT INTERVENTION N/A 05/26/2021   Procedure: CORONARY STENT INTERVENTION;  Surgeon: Wendel Lurena POUR, MD;  Location: [facility] IN[facility];  Service: Cardiovascular;  Laterality: N/A;   Exericse treadmill test  last one 01-03-2014  dr victory sharps   normal exercise tolerance w/ hypertensive repsonse,  no ischemic EKG changes, appropriate HR response & recovery ([facility] TM score 9;  Low Risk , PVC's w/ exertion)   EYE SURGERY     FRACTURE SURGERY     LEFT HEART CATH AND CORS/GRAFTS ANGIOGRAPHY N/A 05/26/2021   Procedure: LEFT HEART CATH AND CORS/GRAFTS ANGIOGRAPHY;  Surgeon: Wendel Lurena POUR, MD;  Location: [facility];  Service: Cardiovascular;  Laterality: N/A;   LOOP  RECORDER INSERTION N/A 12/22/2022   Procedure: LOOP RECORDER INSERTION;  Surgeon: Waddell Danelle ORN, MD;  Location: [facility];  Service: Cardiovascular;  Laterality: N/A;   PROSTATE BIOPSY     RADIOACTIVE SEED IMPLANT N/A 01/13/2016   Procedure: RADIOACTIVE SEED IMPLANT/BRACHYTHERAPY IMPLANT;  Surgeon: Alm Fragmin, MD;  Location: [facility]  Service: Urology;  Laterality: N/A;   Family History  Problem Relation Age of Onset   Dementia Mother    Heart disease Mother    Cancer Father        prostate in eighties   Prostate cancer Father    Heart disease Father    Stroke Father    Cancer Sister        breast   Anxiety disorder Sister    Colon cancer Maternal Grandfather    Cancer Paternal Uncle        prostate   Social History   Socioeconomic History   Marital status: Married    Spouse name: Not on file   Number of children: 2   Years of education: Not on file   Highest education level: 12th grade  Occupational History   Occupation: Chief Technology Officer: [employer]    Comment: [employer], travelling 3-4 nights a week  Tobacco Use   Smoking status: Never   Smokeless tobacco: Never  Vaping Use   Vaping status: Never Used  Substance and Sexual Activity   Alcohol  use: No   Drug use: No   Sexual activity: Yes  Other Topics Concern   Not on file  Social History Narrative   Married 1975   2 daughters and 2 grandkids   Retired 2018.      Right Handed    Lives in a two story home    Social Drivers of Health   Financial Resource Strain: Low Risk  (02/21/2024)   Overall Financial Resource Strain (CARDIA)    Difficulty of Paying Living Expenses: Not hard at all  Food Insecurity: No Food Insecurity (02/21/2024)   Hunger Vital Sign    Worried About Running Out of Food in the Last Year: Never true    Ran Out of Food in the Last Year: Never true  Transportation Needs: No Transportation Needs (02/21/2024)   PRAPARE - Transportation     Lack of Transportation (Medical): No    Lack of  Transportation (Non-Medical): No  Physical Activity: Insufficiently Active (02/21/2024)   Exercise Vital Sign    Days of Exercise per Week: 4 days    Minutes of Exercise per Session: 10 min  Stress: No Stress Concern Present (02/21/2024)   [employer] of Occupational Health - Occupational Stress Questionnaire    Feeling of Stress: Not at all  Social Connections: Moderately Isolated (02/21/2024)   Social Connection and Isolation Panel    Frequency of Communication with Friends and Family: More than three times a week    Frequency of Social Gatherings with Friends and Family: More than three times a week    Attends Religious Services: Never    Database administrator or Organizations: No    Attends Engineer, structural: Never    Marital Status: Married    Tobacco Counseling Counseling given: Not Answered    Clinical Intake:  Pre-visit preparation completed: Yes  Pain : No/denies pain     BMI - recorded: 30.13 Nutritional Status: BMI > 30  Obese Nutritional Risks: None Diabetes: No  Lab Results  Component Value Date   HGBA1C 6.7 (H) 11/30/2023   HGBA1C 6.3 (A) 08/02/2023   HGBA1C 6.4 03/22/2023     How often do you need to have someone help you when you read instructions, pamphlets, or other written materials from your doctor or pharmacy?: 1 - Never  Interpreter Needed?: No  Information entered by :: Shameca Landen,cma   Activities of Daily Living     02/21/2024   12:39 PM 12/26/2023   12:00 PM  In your present state of health, do you have any difficulty performing the following activities:  Hearing? 1   Vision? 0   Difficulty concentrating or making decisions? 1   Walking or climbing stairs? 0   Dressing or bathing? 0   Doing errands, shopping? 1 0  Preparing Food and eating ? N   Using the Toilet? N   In the past six months, have you accidently leaked urine? N   Do you have problems with loss  of bowel control? N   Managing your Medications? Y   Managing your Finances? N   Housekeeping or managing your Housekeeping? N     Patient Care Team: Cleatus Arlyss RAMAN, MD as PCP - General Shlomo Wilbert SAUNDERS, MD as PCP - Cardiology (Cardiology) Latisha Arch, OD (Orthopedic Surgery) Fate Morna SAILOR, [facility] (Inactive) as Pharmacist (Pharmacist) Patel, Donika K, DO as Consulting Physician (Neurology)  I have updated your Care Teams any recent Medical Services you may have received from other providers in the past year.     Assessment:   This is a routine wellness examination for Antoino.  Hearing/Vision screen Hearing Screening - Comments:: Patient wears hearing aids Vision Screening - Comments:: Patient wears glasses    Goals Addressed             This Visit's Progress    Patient Stated       Patient would like to improve balance       Depression Screen     02/21/2024    2:16 PM 01/17/2024   10:57 AM 01/04/2024   10:03 AM 08/02/2023    2:44 PM 02/16/2023    1:44 PM 11/26/2022   11:35 AM 12/19/2021    3:33 PM  PHQ 2/9 Scores  PHQ - 2 Score 1 0 0 0 0 0 0  PHQ- 9 Score 1 0 0 1  2     Fall Risk  02/21/2024   12:39 PM 02/01/2024    1:36 PM 01/17/2024   10:57 AM 01/04/2024   10:03 AM 08/02/2023    2:44 PM  Fall Risk   Falls in the past year? 0 0 0 0 1  Number falls in past yr: 0 0 0 0 0  Injury with Fall? 0 0 0 0 0  Risk for fall due to : No Fall Risks   No Fall Risks History of fall(s)  Follow up Falls evaluation completed Falls evaluation completed  Falls evaluation completed Falls evaluation completed    MEDICARE RISK AT HOME:  Medicare Risk at Home Any stairs in or around the home?: (Patient-Rptd) Yes If so, are there any without handrails?: (Patient-Rptd) No Home free of loose throw rugs in walkways, pet beds, electrical cords, etc?: (Patient-Rptd) Yes Adequate lighting in your home to reduce risk of falls?: (Patient-Rptd) Yes Life alert?: (Patient-Rptd)  No Use of a cane, walker or w/c?: (Patient-Rptd) No Grab bars in the bathroom?: (Patient-Rptd) No Shower chair or bench in shower?: (Patient-Rptd) No Elevated toilet seat or a handicapped toilet?: (Patient-Rptd) No  TIMED UP AND GO:  Was the test performed?  No  Cognitive Function: 6CIT completed    03/01/2018   12:59 PM  MMSE - Mini Mental State Exam  Orientation to time 5  Orientation to Place 5  Registration 3  Attention/ Calculation 0  Recall 3  Language- name 2 objects 0  Language- repeat 1  Language- follow 3 step command 3  Language- read & follow direction 0  Write a sentence 0  Copy design 0  Total score 20        02/21/2024    2:12 PM 02/16/2023    1:49 PM 12/19/2021    3:36 PM  6CIT Screen  What Year? 0 points 0 points 0 points  What month? 0 points 0 points 0 points  What time? 0 points 0 points 0 points  Count back from 20 0 points 0 points 0 points  Months in reverse 0 points 2 points 0 points  Repeat phrase 0 points 4 points 4 points  Total Score 0 points 6 points 4 points    Immunizations Immunization History  Administered Date(s) Administered   Fluad Quad(high Dose 65+) 04/25/2019, 05/18/2020, 04/08/2021, 05/14/2022   Fluzone Influenza virus vaccine,trivalent (IIV3), split virus 05/30/2007   Influenza Split 04/28/2012   Influenza Whole 05/03/2010   Influenza, High Dose Seasonal PF 05/14/2022, 04/12/2023   Influenza,inj,Quad PF,6+ Mos 04/20/2013, 05/11/2014, 05/31/2015, 05/11/2016, 08/11/2018   Moderna Covid-19 Fall Seasonal Vaccine 43yrs & older 04/12/2023   PFIZER(Purple Top)SARS-COV-2 Vaccination 09/25/2019, 10/18/2019, 07/23/2020   PNEUMOCOCCAL CONJUGATE-20 04/12/2023   Pneumococcal Conjugate-13 05/31/2017, 03/01/2018   Pneumococcal Polysaccharide-23 08/04/2007, 04/20/2013   Respiratory Syncytial Virus Vaccine,Recomb Aduvanted(Arexvy) 05/14/2022   Td 08/03/2005   Tdap 05/15/2016   Unspecified SARS-COV-2 Vaccination 04/12/2023   Zoster  Recombinant(Shingrix) 03/22/2023, 05/25/2023   Zoster, Live 10/06/2015    Screening Tests Health Maintenance  Topic Date Due   Diabetic kidney evaluation - Urine ACR  Never done   FOOT EXAM  11/26/2023   INFLUENZA VACCINE  03/03/2024   HEMOGLOBIN A1C  05/31/2024   Diabetic kidney evaluation - eGFR measurement  01/03/2025   OPHTHALMOLOGY EXAM  01/24/2025   Medicare Annual Wellness (AWV)  02/20/2025   DTaP/Tdap/Td (3 - Td or Tdap) 05/15/2026   Colonoscopy  01/20/2027   Pneumococcal Vaccine: 50+ Years  Completed   Hepatitis C Screening  Completed   Zoster Vaccines-  Shingrix  Completed   Hepatitis B Vaccines  Aged Out   HPV VACCINES  Aged Out   Meningococcal B Vaccine  Aged Out   COVID-19 Vaccine  Discontinued    Health Maintenance  Health Maintenance Due  Topic Date Due   Diabetic kidney evaluation - Urine ACR  Never done   FOOT EXAM  11/26/2023   Health Maintenance Items Addressed:   Additional Screening:  Vision Screening: Recommended annual ophthalmology exams for early detection of glaucoma and other disorders of the eye. Would you like a referral to an eye doctor? No    Dental Screening: Recommended annual dental exams for proper oral hygiene  Community Resource Referral / Chronic Care Management: CRR required this visit?  No   CCM required this visit?  No   Plan:    I have personally reviewed and noted the following in the patient's chart:   Medical and social history Use of alcohol , tobacco or illicit drugs  Current medications and supplements including opioid prescriptions. Patient is not currently taking opioid prescriptions. Functional ability and status Nutritional status Physical activity Advanced directives List of other physicians Hospitalizations, surgeries, and ER visits in previous 12 months Vitals Screenings to include cognitive, depression, and falls Referrals and appointments  In addition, I have reviewed and discussed with patient  certain preventive protocols, quality metrics, and best practice recommendations. A written personalized care plan for preventive services as well as general preventive health recommendations were provided to patient.   Lyle MARLA Right, [place]   02/21/2024   After Visit Summary: (MyChart) Due to this being a telephonic visit, the after visit summary with patients personalized plan was offered to patient via MyChart   Notes: Nothing significant to report at this time.

## 2024-02-21 NOTE — Patient Instructions (Signed)
 Tyler Guerra , Thank you for taking time out of your busy schedule to complete your Annual Wellness Visit with me. I enjoyed our conversation and look forward to speaking with you again next year. I, as well as your care team,  appreciate your ongoing commitment to your health goals. Please review the following plan we discussed and let me know if I can assist you in the future. Your Game plan/ To Do List    Referrals: If you haven't heard from the office you've been referred to, please reach out to them at the phone provided.  none Follow up Visits: Next Medicare AWV with our clinical staff: 02/21/2025   Have you seen your provider in the last 6 months (3 months if uncontrolled diabetes)? Yes Next Office Visit with your provider:   Clinician Recommendations:  Aim for 30 minutes of exercise or brisk walking, 6-8 glasses of water , and 5 servings of fruits and vegetables each day.       This is a list of the screening recommended for you and due dates:  Health Maintenance  Topic Date Due   Yearly kidney health urinalysis for diabetes  Never done   Complete foot exam   11/26/2023   Flu Shot  03/03/2024   Hemoglobin A1C  05/31/2024   Yearly kidney function blood test for diabetes  01/03/2025   Eye exam for diabetics  01/24/2025   Medicare Annual Wellness Visit  02/20/2025   DTaP/Tdap/Td vaccine (3 - Td or Tdap) 05/15/2026   Colon Cancer Screening  01/20/2027   Pneumococcal Vaccine for age over 19  Completed   Hepatitis C Screening  Completed   Zoster (Shingles) Vaccine  Completed   Hepatitis B Vaccine  Aged Out   HPV Vaccine  Aged Out   Meningitis B Vaccine  Aged Out   COVID-19 Vaccine  Discontinued    Advanced directives: (Declined) Advance directive discussed with you today. Even though you declined this today, please call our office should you change your mind, and we can give you the proper paperwork for you to fill out. Advance Care Planning is important because it:  [x]  Makes sure  you receive the medical care that is consistent with your values, goals, and preferences  [x]  It provides guidance to your family and loved ones and reduces their decisional burden about whether or not they are making the right decisions based on your wishes.  Follow the link provided in your after visit summary or read over the paperwork we have mailed to you to help you started getting your Advance Directives in place. If you need assistance in completing these, please reach out to us  so that we can help you!  See attachments for Preventive Care and Fall Prevention Tips.

## 2024-02-22 ENCOUNTER — Encounter: Payer: Self-pay | Admitting: Physical Therapy

## 2024-02-22 ENCOUNTER — Ambulatory Visit: Admitting: Physical Therapy

## 2024-02-22 ENCOUNTER — Ambulatory Visit

## 2024-02-22 VITALS — BP 108/74 | HR 89

## 2024-02-22 DIAGNOSIS — M6281 Muscle weakness (generalized): Secondary | ICD-10-CM | POA: Diagnosis not present

## 2024-02-22 DIAGNOSIS — R41841 Cognitive communication deficit: Secondary | ICD-10-CM

## 2024-02-22 DIAGNOSIS — R2681 Unsteadiness on feet: Secondary | ICD-10-CM

## 2024-02-22 DIAGNOSIS — R2689 Other abnormalities of gait and mobility: Secondary | ICD-10-CM

## 2024-02-22 NOTE — Therapy (Signed)
 OUTPATIENT PHYSICAL THERAPY NEURO TREATMENT/DISCHARGE SUMMARY   Patient Name: Tyler Guerra. MRN: [id number] DOB:Jun 16, 1951, 73 y.o., male 31 Date: 02/22/2024   PCP: Cleatus Arlyss RAMAN, MD  REFERRING PROVIDER: Maurice Sharlet RAMAN DEVONNA    END OF SESSION:  PT End of Session - 02/22/24 1148     Visit Number 5    Number of Visits 5    Date for PT Re-Evaluation 03/13/24   due to potential delay in scheduling   PT Start Time 1146    PT Stop Time 1227    PT Time Calculation (min) 41 min    Equipment Utilized During Treatment Gait belt    Activity Tolerance Patient tolerated treatment well    Behavior During Therapy [facility] for tasks assessed/performed            Past Medical History:  Diagnosis Date   Anxiety state 03/16/2016   Aortic stenosis    Mild to moderate by echo 12/2023 with mean aortic valve gradient 12 mmHg   Aphasia 01/26/2017   Benign localized prostatic hyperplasia with lower urinary tract symptoms (LUTS)    BPV (benign positional vertigo) 11/02/2014   Carotid artery stenosis    PER DUPLEX 10-22-2015  BILATERAL ICA 1-39%   Cataract    Coronary artery disease    CABG 2000 with LIMA> LAD, SVG > RCA and free radial > OM. S/P NSTEMI 05/2021 cath with luiminal Irrg SVG>RCA, occluded prox RCA, patent free radial>OM, occluded oLCx, occluded o1   Depression 01/26/2017   Diabetes mellitus without complication (HCC) 03/02/2018   GERD 07/19/2010   Qualifier: Diagnosis of  By: Cleatus MD, Arlyss     GERD (gastroesophageal reflux disease)    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   Gout    Gout 07/18/2010   Qualifier: Diagnosis of  By: Cleatus MD, Arlyss     H/O eye injury    chronic changes to left eye after injury   Hand weakness 03/02/2018   Hearing loss 04/21/2013   History of TIA (transient ischemic attack)    03/ 2017  no residual after brief episode loss peripheral vision   HOH (hard of hearing)    Hyperglycemia 05/17/2016   Hyperlipemia    Hyperlipidemia 07/18/2010    Qualifier: Diagnosis of  By: Cleatus MD, Graham     Hypertension    HYPERTENSION, BENIGN ESSENTIAL 07/18/2010   Qualifier: Diagnosis of  By: Cleatus MD, Graham     Hypokalemia 01/26/2017   Knee pain 03/13/2019   Left shoulder pain 09/04/2018   Pneumothorax 10/11/2019   RIGHT    Prostate cancer (HCC) UROLOGIST-  DR GRAPEY/  ONCOLOGIST-  DR MANNING   dx 2015--- Stage T1c, Gleason 3+4, PSA 4.03, vol 44cc   PVC's (premature ventricular contractions)    Rash and nonspecific skin eruption 10/14/2015   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   S/P CABG x 05 Dec 1998   Skin lesion 10/14/2015   Stroke [facility])    TIA (transient ischemic attack) 10/14/2015   Ventral hernia 10/26/2011   Wears glasses    Past Surgical History:  Procedure Laterality Date   APPENDECTOMY  1975   CARDIAC CATHETERIZATION  12-22-2001   dr victory sharps   severe native vessel disease mLAD 60-70%,  total occulsion pCFX  and pRCA/  widely patent saphenous vein , free radial , and LIMA grafts/  minminal lv dysfunction, ef 60%   CHEST TUBE INSERTION Right 10/11/2019   CORONARY ARTERY BYPASS GRAFT  12/1998  LIMA to LAD,  SVG to PDA and Diagonal, Free radial graft to OM   CORONARY STENT INTERVENTION N/A 05/26/2021   Procedure: CORONARY STENT INTERVENTION;  Surgeon: Wendel Lurena POUR, MD;  Location: [facility] INVASIVE [facility];  Service: Cardiovascular;  Laterality: N/A;   Exericse treadmill test  last one 01-03-2014  dr victory sharps   normal exercise tolerance w/ hypertensive repsonse,  no ischemic EKG changes, appropriate HR response & recovery ([facility] TM score 9;  Low Risk , PVC's w/ exertion)   EYE SURGERY     FRACTURE SURGERY     LEFT HEART CATH AND CORS/GRAFTS ANGIOGRAPHY N/A 05/26/2021   Procedure: LEFT HEART CATH AND CORS/GRAFTS ANGIOGRAPHY;  Surgeon: Wendel Lurena POUR, MD;  Location: [facility] INVASIVE CV [facility];  Service: Cardiovascular;  Laterality: N/A;   LOOP RECORDER INSERTION N/A 12/22/2022   Procedure: LOOP RECORDER  INSERTION;  Surgeon: Waddell Danelle ORN, MD;  Location: [facility] CV [facility];  Service: Cardiovascular;  Laterality: N/A;   PROSTATE BIOPSY     RADIOACTIVE SEED IMPLANT N/A 01/13/2016   Procedure: RADIOACTIVE SEED IMPLANT/BRACHYTHERAPY IMPLANT;  Surgeon: Alm Fragmin, MD;  Location: [facility];  Service: Urology;  Laterality: N/A;   Patient Active Problem List   Diagnosis Date Noted   Insomnia 01/05/2024   Stroke, 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   Diabetes mellitus without complication (HCC) 12/05/2023   Health care maintenance 12/01/2023   Acute CVA (cerebrovascular accident) (HCC) 12/22/2022   History of CVA (cerebrovascular accident) 12/22/2022   Constipation 03/04/2022   CAD (coronary artery disease) 05/24/2021   NSTEMI (non-ST elevated myocardial infarction) (HCC) 05/24/2021   Pneumothorax 10/11/2019   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   Knee joint symptom 03/13/2019   Left shoulder pain 09/04/2018   Anxiety state 03/16/2016   Pseudophakia of both eyes 02/24/2016   TIA (transient ischemic attack) 10/14/2015   BPV (benign positional vertigo) 11/02/2014   Advance care planning 05/13/2014   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   Hearing loss 04/21/2013   Ventral hernia 10/26/2011   Medicare annual wellness visit, subsequent 10/26/2011   Prostate cancer (HCC) 10/26/2011   Coronary atherosclerosis 07/19/2010   GERD 07/19/2010   Hyperlipidemia 07/18/2010   Gout 07/18/2010   Hypertension 07/18/2010    ONSET DATE: 12/28/2023  REFERRING DIAG: I63.9 (ICD-10-CM) - Acute CVA (cerebrovascular accident) (HCC)   THERAPY DIAG:  Muscle weakness (generalized)  Unsteadiness on feet  Other abnormalities of gait and mobility  Rationale for Evaluation and Treatment: Rehabilitation  SUBJECTIVE:  SUBJECTIVE STATEMENT:  No pain, no falls. Went down the steps holding 2 objects and was not able to hold onto the handrail. Not planning on doing this anymore.   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 CT perfusion showed no core infarct. MRI brain showed 3 mm infarct right periatrial white matter with background parenchymal atrophy. EEG was negative for seizures and was suggestive of encephalopathy   Received inpatient rehab and was discharged 12/30/23  PAIN:  Are you having pain? No  Vitals:   02/22/24 1156  BP: 108/74  Pulse: 89     PRECAUTIONS: Fall  FALLS: Has patient fallen in last 6 months? No  LIVING ENVIRONMENT: Lives with: lives with their spouse and family close by Lives in: House/apartment Stairs: Yes: Internal: 12 steps; on right going up, level entry into house  Has following equipment at home: Single point cane, Walker - 2 wheeled, and reports needs to put some   PLOF: Independent  PATIENT GOALS: Wants to get back his normal balance, just wants any improvement  OBJECTIVE:  Note: Objective measures were completed at Evaluation unless otherwise noted.  DIAGNOSTIC FINDINGS: MRI brain 12/19/23 IMPRESSION: 1. 3 mm acute infarct within the right periatrial white matter. 2. Background parenchymal atrophy, chronic small vessel disease and redemonstrated chronic infarcts as described.  COGNITION: Overall cognitive status: Impaired   SENSATION: WFL Pt denies numbness/tingling in BLE   COORDINATION: Heel to shin: WNL bilat    POSTURE: rounded shoulders and forward  head   LOWER EXTREMITY MMT:    MMT Right Eval Left Eval  Hip flexion 5 5  Hip extension    Hip abduction    Hip adduction    Hip internal rotation    Hip external rotation    Knee flexion 5 5  Knee extension 5 5  Ankle dorsiflexion 5 4+  Ankle plantarflexion    Ankle inversion    Ankle eversion    (Blank rows = not tested)  BED MOBILITY:  Pt denies difficulties   TRANSFERS: Sit to stand: Complete Independence  Assistive device utilized: None     Stand to sit: Complete Independence  Assistive device utilized: None      Performs with no UE support   STAIRS:  Level of Assistance: SBA  Stair Negotiation Technique: Alternating Pattern  Forwards with Single Rail on Right  Number of Stairs: 4   Height of Stairs: 6  Comments: when trying to descend without a railing, pt performs with step to pattern    GAIT: Gait pattern: step through pattern and decreased stride length Distance walked: Clinic distances  Assistive device utilized: None Level of assistance: Modified independence Slightly bilateral external rotation noted during gait    FUNCTIONAL TESTS:  5 times sit to stand: 11.5 seconds no UE support  30 seconds chair stand test: 12 sit <> stands with no UE support (<12 below normal for age) 10 meter walk test: 11.6 seconds no AD = 2.83 ft/sec   SLS: RLE: 7 seconds, LLE: 1-2 seconds  TREATMENT DATE:   Therapeutic Activity:  Vitals:   02/22/24 1156  BP: 108/74  Pulse: 89   Assessed in LUE at rest prior to intervention. Vitals WNL.  Discussed fall risk and making sure pt does NOT hold onto handrails while holding items in hands (as pt reports that he did this this morning), discussed he can hold one item in hand as long as he holds onto the handrail at all times due to fall risk, pt verbalized undestanding  Mentioned [employer] program as pt has attended in the past and it is starting up again soon, pt would not like to attend again, was not a fan of yoga  Reviewed entirety of HEP (see HEP section below), pt and pt's spouse with no further questions at this time, provided new handout   Goal Assessment:  SLS: RLE - 9 and 12 seconds, LLE - ~2 seconds  30 second chair stand: 10 sit <> stands first attempt, 12 sit <> stands 2nd attempt    NMR: With 5 blaze pods (3 on first step, 2 on 2nd step) to work on weight shifting, SLS, foot clearance, reaction times. Performed on air ex for compliant surface, with no UE support, CGA for balance, cues to alternate between legs  Performed 2 bouts of 1 minute each: 28 hits, 36 hits, pt reporting RPE as 7/10 Performed 2 bout of 1 minute with cognitive challenge: 15 hits (naming animals),  11 hits (naming cars) Pt reporting RPE as 7/10     PATIENT EDUCATION: Education details: Results of goals, plan to D/C  Person educated: Patient and Spouse Education method: Explanation, Demonstration, and Handouts Education comprehension: verbalized understanding and returned demonstration  HOME EXERCISE PROGRAM: Access Code: [email or web address] URL: [email or web address] Date: 02/22/2024 Prepared by: Sheffield Senate  Exercises - Tandem Walking with Counter Support  - 1-2 x daily - 5 x weekly - 3 sets - Standing Marching  - 1-2 x daily - 5 x weekly - 3 sets - Standing Single Leg Stance with Counter Support  - 1-2 x daily - 5 x weekly - 3 sets - 15 hold - Standing Balance with Eyes Closed on Foam  - 1-2 x daily - 5 x weekly - 3 sets - 30 hold - Sit to Stand  - 1 x daily - 5 x weekly - 2 sets - 12 reps  PHYSICAL THERAPY DISCHARGE SUMMARY  Visits from Start of Care: 5  Current functional level related to goals / functional outcomes: See LTGs/Clinical Assessment Statement   Remaining deficits: Impaired cognition, decr functional strength, impaired high level balance    Education / Equipment: HEP   Patient agrees to discharge. Patient goals were not met. Patient is being discharged due to maximized rehab potential.    GOALS: Goals reviewed with patient? Yes  SHORT TERM GOALS: ALL STGS = LTGS   LONG TERM GOALS: Target date: 02/22/2024 (updated to match last scheduled visit within POC)  Pt will be independent with final HEP for strength/balance in order to build upon functional gains made in therapy. Baseline:  Goal status: PARTIALLY MET  2.  Pt will improve FGA to at least a 28/30 in order to demo decr fall risk. Baseline: 25/30, 25/30 (7/15) Goal status: NOT MET  3.  Pt will improve 30 second chair stand to 14 in order to demo improved functional strength/endurance  Baseline: 12 sit <> stands  12 sit <> stands 2nd attempt  (7/22) Goal status: NOT MET  4.  Pt will improve SLS consistently on LLE to at least 5 seconds for improved stability for balance Baseline: 1-2 seconds   LLE - ~2 seconds  (7/22) Goal status: NOT MET    ASSESSMENT:  CLINICAL IMPRESSION: Today's skilled session focused on assessing remainder of LTGs for anticipated D/C. In previous session, FGA was assessed and pt still scoring a 25/30, indicating a low fall risk. Pt's SLS time on LLE and 30 second chair stand remained the same since eval date. Pt's 30 second chair stand was 12 sit <> stands which is below normal for age. Encouraged pt to continue working on this at home. Reviewed HEP with pt with no questions at this time. Pt continues to be more challenged with cognitive dual tasking and gait. Pt in agreement to D/C from PT at this time.    OBJECTIVE IMPAIRMENTS: Abnormal gait, decreased activity tolerance, decreased balance, decreased cognition, decreased strength, and postural dysfunction.   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   REHAB POTENTIAL: Good  CLINICAL DECISION MAKING: Stable/uncomplicated  EVALUATION COMPLEXITY: Low  PLAN:  PT FREQUENCY: 1x/week  PT DURATION: 8 weeks - due to potential delay in scheduling - only anticipate 4 weeks   PLANNED INTERVENTIONS: [id number]- PT Re-evaluation, 97110-Therapeutic exercises, 97530- Therapeutic activity, [id number]- Neuromuscular re-education, 97535- Self Care, [id number]- Manual therapy, [phone]- Gait training, Patient/Family education, Balance training, Stair training, and Vestibular training  PLAN FOR NEXT SESSION: d/c  Sheffield Senate, PT, DPT 02/22/24 12:51 PM

## 2024-02-22 NOTE — Therapy (Signed)
 OUTPATIENT SPEECH LANGUAGE PATHOLOGY TREATMENT (DISCHARGE)   Patient Name: Tyler Guerra. MRN: [id number] DOB:April 08, 1951, 73 y.o., male 4 Date: 02/22/2024  PCP: Cleatus Arlyss RAMAN MD REFERRING PROVIDER: Maurice Sharlet RAMAN, PA-C  END OF SESSION:  End of Session - 02/22/24 1458     Visit Number 3    Number of Visits 9    Date for SLP Re-Evaluation 03/20/24    Authorization Type Medicare A&B/ BCBS    SLP Start Time 1318    SLP Stop Time  1400    SLP Time Calculation (min) 42 min    Activity Tolerance Patient tolerated treatment well         SPEECH THERAPY DISCHARGE SUMMARY  Visits from Start of Care: 3  Current functional level related to goals / functional outcomes: Pt and wife report return to prior baseline. Denied any ongoing cognitive concerns with use of recommended compensations.    Remaining deficits: none   Education / Equipment: Cognitive compensations   Patient agrees to discharge. Patient goals were met. Patient is being discharged due to being pleased with the current functional level..     Past Medical History:  Diagnosis Date   Anxiety state 03/16/2016   Aortic stenosis    Mild to moderate by echo 12/2023 with mean aortic valve gradient 12 mmHg   Aphasia 01/26/2017   Benign localized prostatic hyperplasia with lower urinary tract symptoms (LUTS)    BPV (benign positional vertigo) 11/02/2014   Carotid artery stenosis    PER DUPLEX 10-22-2015  BILATERAL ICA 1-39%   Cataract    Coronary artery disease    CABG 2000 with LIMA> LAD, SVG > RCA and free radial > OM. S/P NSTEMI 05/2021 cath with luiminal Irrg SVG>RCA, occluded prox RCA, patent free radial>OM, occluded oLCx, occluded o1   Depression 01/26/2017   Diabetes mellitus without complication (HCC) 03/02/2018   GERD 07/19/2010   Qualifier: Diagnosis of  By: Cleatus MD, Arlyss     GERD (gastroesophageal reflux disease)    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   Gout    Gout 07/18/2010   Qualifier:  Diagnosis of  By: Cleatus MD, Arlyss     H/O eye injury    chronic changes to left eye after injury   Hand weakness 03/02/2018   Hearing loss 04/21/2013   History of TIA (transient ischemic attack)    03/ 2017  no residual after brief episode loss peripheral vision   HOH (hard of hearing)    Hyperglycemia 05/17/2016   Hyperlipemia    Hyperlipidemia 07/18/2010   Qualifier: Diagnosis of  By: Cleatus MD, Graham     Hypertension    HYPERTENSION, BENIGN ESSENTIAL 07/18/2010   Qualifier: Diagnosis of  By: Cleatus MD, Graham     Hypokalemia 01/26/2017   Knee pain 03/13/2019   Left shoulder pain 09/04/2018   Pneumothorax 10/11/2019   RIGHT    Prostate cancer (HCC) UROLOGIST-  DR GRAPEY/  ONCOLOGIST-  DR MANNING   dx 2015--- Stage T1c, Gleason 3+4, PSA 4.03, vol 44cc   PVC's (premature ventricular contractions)    Rash and nonspecific skin eruption 10/14/2015   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   S/P CABG x 05 Dec 1998   Skin lesion 10/14/2015   Stroke [facility])    TIA (transient ischemic attack) 10/14/2015   Ventral hernia 10/26/2011   Wears glasses    Past Surgical History:  Procedure Laterality Date   APPENDECTOMY  1975   CARDIAC CATHETERIZATION  12-22-2001   dr victory sharps   severe native vessel disease mLAD 60-70%,  total occulsion pCFX  and pRCA/  widely patent saphenous vein , free radial , and LIMA grafts/  minminal lv dysfunction, ef 60%   CHEST TUBE INSERTION Right 10/11/2019   CORONARY ARTERY BYPASS GRAFT  12/1998   LIMA to LAD,  SVG to PDA and Diagonal, Free radial graft to OM   CORONARY STENT INTERVENTION N/A 05/26/2021   Procedure: CORONARY STENT INTERVENTION;  Surgeon: Wendel Lurena POUR, MD;  [facility];  Service: Cardiovascular;  Laterality: N/A;   Exericse treadmill test  last one 01-03-2014  dr victory sharps   normal exercise tolerance w/ hypertensive repsonse,  no ischemic EKG changes, appropriate HR response & recovery (Duke TM score  9;  Low Risk , PVC's w/ exertion)   EYE SURGERY     FRACTURE SURGERY     LEFT HEART CATH AND CORS/GRAFTS ANGIOGRAPHY N/A 05/26/2021   Procedure: LEFT HEART CATH AND CORS/GRAFTS ANGIOGRAPHY;  Surgeon: Wendel Lurena POUR, MD;  [facility];  Service: Cardiovascular;  Laterality: N/A;   LOOP RECORDER INSERTION N/A 12/22/2022   Procedure: LOOP RECORDER INSERTION;  Surgeon: Waddell Danelle ORN, MD;  [facility];  Service: Cardiovascular;  Laterality: N/A;   PROSTATE BIOPSY     RADIOACTIVE SEED IMPLANT N/A 01/13/2016   Procedure: RADIOACTIVE SEED IMPLANT/BRACHYTHERAPY IMPLANT;  Surgeon: Alm Fragmin, MD;  Location: [facility];  Service: Urology;  Laterality: N/A;   Patient Active Problem List   Diagnosis Date Noted   Insomnia 01/05/2024   Stroke, 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   Diabetes mellitus without complication (HCC) 12/05/2023   Health care maintenance 12/01/2023   Acute CVA (cerebrovascular accident) (HCC) 12/22/2022   History of CVA (cerebrovascular accident) 12/22/2022   Constipation 03/04/2022   CAD (coronary artery disease) 05/24/2021   NSTEMI (non-ST elevated myocardial infarction) (HCC) 05/24/2021   Pneumothorax 10/11/2019   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   Knee joint symptom 03/13/2019   Left shoulder pain 09/04/2018   Anxiety state 03/16/2016   Pseudophakia of both eyes 02/24/2016   TIA (transient ischemic attack) 10/14/2015   BPV (benign positional vertigo) 11/02/2014   Advance care planning 05/13/2014   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   Hearing loss 04/21/2013   Ventral hernia 10/26/2011   Medicare annual wellness visit, subsequent 10/26/2011   Prostate cancer (HCC) 10/26/2011   Coronary atherosclerosis 07/19/2010   GERD 07/19/2010   Hyperlipidemia 07/18/2010   Gout 07/18/2010   Hypertension 07/18/2010    ONSET DATE: 12/28/2023 (referral date)   REFERRING DIAG: I63.9 (ICD-10-CM) - Acute CVA  (cerebrovascular accident)  THERAPY DIAG: Cognitive communication deficit  Rationale for Evaluation and Treatment: Rehabilitation  SUBJECTIVE:   SUBJECTIVE STATEMENT: Endorsed successful carryover and use of planner to support recall  Pt accompanied by: significant other, [name]   PAIN: Are you having pain? No  PATIENT GOALS: Return to baseline  TREATMENT DATE:  02/22/24: Pt and wife report that pt has returned to prior baseline functioning. Returned with planner with future dates present as recommended. Reported success completing daily tasks, recalling conversations and written information, and implementing recommended compensations. Provided recommendation to use alarms/timers to alert self of prospective events (I.e., call daughter in 10 minutes given household distractions). Pt verbalized understanding. Pt and wife are pleased with current level of functioning and agreeable to ST discharge this date.   02/15/24: Corean and [name] endorse overall cognitive improvement. He hosted his [place], spoke to everyone and lead prayers successfully. At this time, he is writing on his calendar after events happen. For example, he said he will go home and write down that he came to PT with [facility]. We generated strategy of writing down weekly appointments and events on Sunday night with [name]'s help to be ahead on planning for weekly events. Bijan endorsed he used a Pensions consultant to plan ahead when he was working. They agree to keep 1-2 weeks ahead in planner. Corean reports that Shigeru is recalling his weekly tasks of managing the households trash cans in each room, taking out trash to the curb and bringing it back as well as managing dog yard waste and household packages with rare min reminders from her. We discussed using planner as a to do list as well, if they  find he is forgetting to complete any household chores. At this time, they report that processing of conversation and information has improved. See Patient instructions.   01/24/24: ST evaluation and POC complete. Utilizing some cognitive compensations, such as internal repetition and writing to-do lists to support cognition at home. Endorsed use of daily schedule in CIR but not currently utilizing at home. Recommended re-starting this practice given overt benefit. Pt and wife verbalized understanding and agreement.     PATIENT EDUCATION: Education details: see above Person educated: Patient and Spouse Education method: Explanation Education comprehension: verbalized understanding and needs further education   GOALS: Goals reviewed with patient? Yes  SHORT TERM GOALS: Target date: 02/21/2024  Pt will complete daily schedule x2 opportunities given rare min A Baseline: Goal status: MET  2.  Pt will utilize strategies to aid successful completion of novel task/routine given occasional min A Baseline:  Goal status: MET  3.  Pt will self-advocate for additional processing time for x2 opportunities given rare min A  Baseline:  Goal status: MET   LONG TERM GOALS: Target date: 03/20/2024  Pt will utilize written/visual cognitive supports (to-list, daily schedule) > 1 week given rare min A  Baseline:  Goal status: MET  2.  Pt will carryover cognitive compensations to complete novel household tasks given rare min A  Baseline:  Goal status: MET  3.  Pt and caregiver will report improved cognitive linguistic functioning by LTG date Baseline:  Goal status: MET   ASSESSMENT:  CLINICAL IMPRESSION: Patient is a 73 y.o. M who was seen today for ST tx s/p stroke. Pt is accompanied by wife, Corean. Pt and wife report return to baseline cognitive functioning. Utilizing recommended cognitive compensation and strategies with benefit. Pt is pleased with current level of functioning and  agreeable to ST discharge this date.   OBJECTIVE IMPAIRMENTS: include attention, memory, awareness, and executive functioning. These impairments are limiting patient from household responsibilities. Factors affecting potential to achieve goals and functional outcome are previous level of function.. Patient will benefit from skilled SLP services to address above impairments and improve overall function.  REHAB POTENTIAL:  Good  PLAN:  SLP FREQUENCY: 1x/week  SLP DURATION: 8 weeks  PLANNED INTERVENTIONS: Environmental controls, Cognitive reorganization, Functional tasks, Multimodal communication approach, SLP instruction and feedback, Compensatory strategies, Patient/family education, [phone] Treatment of speech (30 or 45 min) , and [id number]- Speech Eval Sound Prod, Artic, Phon, Eval Compre, Express    Comer LILLETTE Louder, CCC-SLP 02/22/2024, 2:59 PM

## 2024-02-22 NOTE — Progress Notes (Signed)
 [facility] Summary Report / Loop Recorder

## 2024-02-28 ENCOUNTER — Encounter: Attending: Registered Nurse | Admitting: Physical Medicine and Rehabilitation

## 2024-02-28 ENCOUNTER — Encounter: Payer: Self-pay | Admitting: Physical Medicine and Rehabilitation

## 2024-02-28 VITALS — BP 136/88 | HR 86 | Ht 70.0 in | Wt 209.8 lb

## 2024-02-28 DIAGNOSIS — Z8673 Personal history of transient ischemic attack (TIA), and cerebral infarction without residual deficits: Secondary | ICD-10-CM | POA: Diagnosis not present

## 2024-02-28 DIAGNOSIS — I639 Cerebral infarction, unspecified: Secondary | ICD-10-CM | POA: Insufficient documentation

## 2024-02-28 MED ORDER — QUETIAPINE FUMARATE 25 MG PO TABS: 12.5000 mg | ORAL_TABLET | Freq: Every day | ORAL | 1 refills | Status: DC

## 2024-02-28 NOTE — Progress Notes (Signed)
 pr  Subjective:    Patient ID: Tyler Guerra., male    DOB: 1951-08-03, 73 y.o.   MRN: [id number]  HPI Pt is a 73 yr old male with hx of R parietal CVA with impaired cognition, HTN, intracranial atherosclerosis, DM A1c 6.7; HLD; Prostate CA; Class 1 obesity; BMI 30.10; hx of gout, mood disorder on Lexapro  and here for f/u on CVA. Has loop recorder- no Afib.     Been a few months Things going well- Did 1 month of Outpt SLP and PT, didn't end up needing OT.   Feels more normal.  Cognition about the same as before this past stroke. At baseline. Like nothing ever occurred.    Has HEP- and does some at home- tries to do 1/2 hour- 3x/week or so.  Pretty good- doesn't need to do SLP HEP.  Just finished SLP 7/22  Hasn't driven yet- wife waiting per pt! Wife is doing all the driving- before latest stroke, would drive to [employer]- uses [employer] to know where he is.   Concentration- is still decreased- would answer phone if rang while driving.    Walking- does a lot of walking during the day-  at least 1-2 miles/day. Goes in backyard and garage and going up/down stairs.  Still tries to ride exercise bike- pulled a muscle or has a hernia- in groin? Started a few weeks ago- hurts in R groin.  Picked up a mobile generator- felt something pull in R groin.   To see PCP for this.   Went to [place] - had to go- something going on with house down there.   Sleeping OK.  Still taking Amantadine - got Rx from Dr Cleatus- 100 mg daily- - got from express script? Also has 3 pills as of 6/29 at [facility]-  Giving Seroquel  12.5 mg- last day today-   Had 1 night had some serious leg cramps- but not muscle spasms- so no issues  Trying to work into wearing hearing aids.   Pain Inventory Average Pain 0 Pain Right Now 0 My pain is N/A  In the last 24 hours, has pain interfered with the following? General activity 0 Relation with others 0 Enjoyment of life 0 What TIME of day is your pain at its  worst? varies Sleep (in general) Good  Pain is worse with: no pain Pain improves with: no pain Relief from Meds: no pain medication  Family History  Problem Relation Age of Onset   Dementia Mother    Heart disease Mother    Cancer Father        prostate in eighties   Prostate cancer Father    Heart disease Father    Stroke Father    Cancer Sister        breast   Anxiety disorder Sister    Colon cancer Maternal Grandfather    Cancer Paternal Uncle        prostate   Social History   Socioeconomic History   Marital status: Married    Spouse name: Not on file   Number of children: 2   Years of education: Not on file   Highest education level: 12th grade  Occupational History   Occupation: Chief Technology Officer: [employer]    Comment: [employer], travelling 3-4 nights a week  Tobacco Use   Smoking status: Never   Smokeless tobacco: Never  Vaping Use   Vaping status: Never Used  Substance and Sexual Activity   Alcohol  use:  No   Drug use: No   Sexual activity: Yes  Other Topics Concern   Not on file  Social History Narrative   Married 1975   2 daughters and 2 grandkids   Retired 2018.      Right Handed    Lives in a two story home    Social Drivers of Health   Financial Resource Strain: Low Risk  (02/21/2024)   Overall Financial Resource Strain (CARDIA)    Difficulty of Paying Living Expenses: Not hard at all  Food Insecurity: No Food Insecurity (02/21/2024)   Hunger Vital Sign    Worried About Running Out of Food in the Last Year: Never true    Ran Out of Food in the Last Year: Never true  Transportation Needs: No Transportation Needs (02/21/2024)   PRAPARE - Administrator, Civil Service (Medical): No    Lack of Transportation (Non-Medical): No  Physical Activity: Insufficiently Active (02/21/2024)   Exercise Vital Sign    Days of Exercise per Week: 4 days    Minutes of Exercise per Session: 10 min  Stress: No Stress Concern  Present (02/21/2024)   [employer] of Occupational Health - Occupational Stress Questionnaire    Feeling of Stress: Not at all  Social Connections: Moderately Isolated (02/21/2024)   Social Connection and Isolation Panel    Frequency of Communication with Friends and Family: More than three times a week    Frequency of Social Gatherings with Friends and Family: More than three times a week    Attends Religious Services: Never    Database administrator or Organizations: No    Attends Banker Meetings: Never    Marital Status: Married   Past Surgical History:  Procedure Laterality Date   APPENDECTOMY  1975   CARDIAC CATHETERIZATION  12-22-2001   dr victory sharps   severe native vessel disease mLAD 60-70%,  total occulsion pCFX  and pRCA/  widely patent saphenous vein , free radial , and LIMA grafts/  minminal lv dysfunction, ef 60%   CHEST TUBE INSERTION Right 10/11/2019   CORONARY ARTERY BYPASS GRAFT  12/1998   LIMA to LAD,  SVG to PDA and Diagonal, Free radial graft to OM   CORONARY STENT INTERVENTION N/A 05/26/2021   Procedure: CORONARY STENT INTERVENTION;  Surgeon: Wendel Lurena POUR, MD;  [facility];  Service: Cardiovascular;  Laterality: N/A;   Exericse treadmill test  last one 01-03-2014  dr victory sharps   normal exercise tolerance w/ hypertensive repsonse,  no ischemic EKG changes, appropriate HR response & recovery (Duke TM score 9;  Low Risk , PVC's w/ exertion)   EYE SURGERY     FRACTURE SURGERY     LEFT HEART CATH AND CORS/GRAFTS ANGIOGRAPHY N/A 05/26/2021   Procedure: LEFT HEART CATH AND CORS/GRAFTS ANGIOGRAPHY;  Surgeon: Wendel Lurena POUR, MD;  [facility];  Service: Cardiovascular;  Laterality: N/A;   LOOP RECORDER INSERTION N/A 12/22/2022   Procedure: LOOP RECORDER INSERTION;  Surgeon: Waddell Danelle ORN, MD;  [facility];  Service: Cardiovascular;  Laterality: N/A;   PROSTATE BIOPSY     RADIOACTIVE SEED IMPLANT  N/A 01/13/2016   Procedure: RADIOACTIVE SEED IMPLANT/BRACHYTHERAPY IMPLANT;  Surgeon: Alm Fragmin, MD;  Location: [facility];  Service: Urology;  Laterality: N/A;   Past Surgical History:  Procedure Laterality Date   APPENDECTOMY  1975   CARDIAC CATHETERIZATION  12-22-2001   dr victory sharps  severe native vessel disease mLAD 60-70%,  total occulsion pCFX  and pRCA/  widely patent saphenous vein , free radial , and LIMA grafts/  minminal lv dysfunction, ef 60%   CHEST TUBE INSERTION Right 10/11/2019   CORONARY ARTERY BYPASS GRAFT  12/1998   LIMA to LAD,  SVG to PDA and Diagonal, Free radial graft to OM   CORONARY STENT INTERVENTION N/A 05/26/2021   Procedure: CORONARY STENT INTERVENTION;  Surgeon: Wendel Lurena POUR, MD;  [facility];  Service: Cardiovascular;  Laterality: N/A;   Exericse treadmill test  last one 01-03-2014  dr victory sharps   normal exercise tolerance w/ hypertensive repsonse,  no ischemic EKG changes, appropriate HR response & recovery (Duke TM score 9;  Low Risk , PVC's w/ exertion)   EYE SURGERY     FRACTURE SURGERY     LEFT HEART CATH AND CORS/GRAFTS ANGIOGRAPHY N/A 05/26/2021   Procedure: LEFT HEART CATH AND CORS/GRAFTS ANGIOGRAPHY;  Surgeon: Wendel Lurena POUR, MD;  [facility];  Service: Cardiovascular;  Laterality: N/A;   LOOP RECORDER INSERTION N/A 12/22/2022   Procedure: LOOP RECORDER INSERTION;  Surgeon: Waddell Danelle ORN, MD;  [facility];  Service: Cardiovascular;  Laterality: N/A;   PROSTATE BIOPSY     RADIOACTIVE SEED IMPLANT N/A 01/13/2016   Procedure: RADIOACTIVE SEED IMPLANT/BRACHYTHERAPY IMPLANT;  Surgeon: Alm Fragmin, MD;  Location: [facility];  Service: Urology;  Laterality: N/A;   Past Medical History:  Diagnosis Date   Anxiety state 03/16/2016   Aortic stenosis    Mild to moderate by echo 12/2023 with mean aortic valve gradient 12 mmHg   Aphasia 01/26/2017   Benign  localized prostatic hyperplasia with lower urinary tract symptoms (LUTS)    BPV (benign positional vertigo) 11/02/2014   Carotid artery stenosis    PER DUPLEX 10-22-2015  BILATERAL ICA 1-39%   Cataract    Coronary artery disease    CABG 2000 with LIMA> LAD, SVG > RCA and free radial > OM. S/P NSTEMI 05/2021 cath with luiminal Irrg SVG>RCA, occluded prox RCA, patent free radial>OM, occluded oLCx, occluded o1   Depression 01/26/2017   Diabetes mellitus without complication (HCC) 03/02/2018   GERD 07/19/2010   Qualifier: Diagnosis of  By: Cleatus MD, Arlyss     GERD (gastroesophageal reflux disease)    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   Gout    Gout 07/18/2010   Qualifier: Diagnosis of  By: Cleatus MD, Arlyss     H/O eye injury    chronic changes to left eye after injury   Hand weakness 03/02/2018   Hearing loss 04/21/2013   History of TIA (transient ischemic attack)    03/ 2017  no residual after brief episode loss peripheral vision   HOH (hard of hearing)    Hyperglycemia 05/17/2016   Hyperlipemia    Hyperlipidemia 07/18/2010   Qualifier: Diagnosis of  By: Cleatus MD, Graham     Hypertension    HYPERTENSION, BENIGN ESSENTIAL 07/18/2010   Qualifier: Diagnosis of  By: Cleatus MD, Graham     Hypokalemia 01/26/2017   Knee pain 03/13/2019   Left shoulder pain 09/04/2018   Pneumothorax 10/11/2019   RIGHT    Prostate cancer (HCC) UROLOGIST-  DR GRAPEY/  ONCOLOGIST-  DR MANNING   dx 2015--- Stage T1c, Gleason 3+4, PSA 4.03, vol 44cc   PVC's (premature ventricular contractions)    Rash and nonspecific skin eruption 10/14/2015   S/P CABG (coronary artery bypass graft)  05/13/2014   S/P CABG x 05 Dec 1998   Skin lesion 10/14/2015   Stroke [facility])    TIA (transient ischemic attack) 10/14/2015   Ventral hernia 10/26/2011   Wears glasses    BP 136/88 (BP Location: Left Arm, Patient Position: Sitting, Cuff Size: Large)   Pulse 86   Ht 5' 10 (1.778 m)   Wt 209 lb 12.8 oz (95.2 kg)   SpO2 97%    BMI 30.10 kg/m   Opioid Risk Score:   Fall Risk Score:  `1  Depression screen [facility] 2/9     02/21/2024    2:16 PM 01/17/2024   10:57 AM 01/04/2024   10:03 AM 08/02/2023    2:44 PM 02/16/2023    1:44 PM 11/26/2022   11:35 AM 12/19/2021    3:33 PM  Depression screen PHQ 2/9  Decreased Interest 0 0 0 0 0 0 0  Down, Depressed, Hopeless 1 0 0 0 0 0 0  PHQ - 2 Score 1 0 0 0 0 0 0  Altered sleeping 0 0 0 0  0   Tired, decreased energy 0 0 0 1  2   Change in appetite 0 0 0 0  0   Feeling bad or failure about yourself  0 0 0 0  0   Trouble concentrating 0 0 0 0  0   Moving slowly or fidgety/restless 0 0 0 0  0   Suicidal thoughts 0 0 0 0  0   PHQ-9 Score 1 0 0 1  2   Difficult doing work/chores Not difficult at all  Not difficult at all Not difficult at all  Not difficult at all      Review of Systems    An entire ROS was completed and negative except for HPI Objective:   Physical Exam  Awake, appropriate, accompanied by wife, NAD Very HOH-  No assistive devices Neuro: No spasticity- no hoffman's, no clonus B/L No increased tone DTRs 2+ in Ue's and 1+ in LEs Naming 3/3- repetition intact    MSK: 5/5 in UE sand LEs B/L - throughout         Assessment & Plan:    Pt is a 73 yr old male with hx of recent R parietal CVA- 12/2023-  with impaired cognition initially- last stroke- in 2024-  , HTN, intracranial atherosclerosis, DM A1c 6.7; HLD; Prostate CA; Class 1 obesity; BMI 30.10; hx of gout, mood disorder on Lexapro  and here for f/u on CVA. Has loop recorder- no Afib.   Will send to Seroquel /Quetiapine  1/2 tab AS NEEDED- for sleep- will send to [facility] [place], [place] just in case he needs for sleep.   2.   Will try to wean off Amantadine  - just stop taking it- for 1 week- try it out- if has any signs of increased fatigue, increased word finding issues or less energy, then just restart the dose-100 mg daily- would continue for 1 year, and then try to come off again in worst  case scenario.   3.   Spasticity- can develop- muscle tightness or spasms would see- if you develop- if so, you call me back and then we can treat as necessary.  40% of stroke patients can develop- of note. A lot of patients feel like a tin man from wizard of oz.    4.  We discussed a stroke and what it means- and the rewiring it does- but another stroke could mean don't get  everything back! Try to avoid another stroke if at all possible. Discussed at length   5. F/U in 6 months- discussed how to reduce risk of stroke- by eating better and exercising.    I spent a total of  33  minutes on total care today- >50% coordination of care- due to education about stroke, reduction in risk and explaining what a stroke does to the brain.

## 2024-02-28 NOTE — Patient Instructions (Signed)
 Pt is a 73 yr old male with hx of recent R parietal CVA- 12/2023-  with impaired cognition initially- last stroke- in 2024-  , HTN, intracranial atherosclerosis, DM A1c 6.7; HLD; Prostate CA; Class 1 obesity; BMI 30.10; hx of gout, mood disorder on Lexapro  and here for f/u on CVA.   Will send to Seroquel /Quetiapine  1/2 tab AS NEEDED- for sleep- will send to [facility] [place], [place] just in case he needs for sleep.   2.   Will try to wean off Amantadine  - just stop taking it- for 1 week- try it out- if has any signs of increased fatigue, increased word finding issues or less energy, then just restart the dose-100 mg daily- would continue for 1 year, and then try to come off again in worst case scenario.   3.   Spasticity- can develop- muscle tightness or spasms would see- if you develop- if so, you call me back and then we can treat as necessary.  40% of stroke patients can develop- of note. A lot of patients feel like a tin man from wizard of oz.    4.  We discussed a stroke and what it means- and the rewiring it does- but another stroke could mean don't get everything back! Try to avoid another stroke if at all possible.    5. F/U in 6 months-  discussed how to reduce risk of stroke- by eating better and exercising.

## 2024-03-09 ENCOUNTER — Ambulatory Visit: Payer: Self-pay | Admitting: Internal Medicine

## 2024-03-09 ENCOUNTER — Ambulatory Visit (INDEPENDENT_AMBULATORY_CARE_PROVIDER_SITE_OTHER)

## 2024-03-09 DIAGNOSIS — G459 Transient cerebral ischemic attack, unspecified: Secondary | ICD-10-CM

## 2024-03-09 LAB — CUP PACEART REMOTE DEVICE CHECK
Date Time Interrogation Session: 20250806232550
Implantable Pulse Generator Implant Date: 20240521

## 2024-03-10 DIAGNOSIS — H47292 Other optic atrophy, left eye: Secondary | ICD-10-CM | POA: Diagnosis not present

## 2024-03-10 DIAGNOSIS — H2511 Age-related nuclear cataract, right eye: Secondary | ICD-10-CM | POA: Diagnosis not present

## 2024-03-10 DIAGNOSIS — H35033 Hypertensive retinopathy, bilateral: Secondary | ICD-10-CM | POA: Diagnosis not present

## 2024-03-10 DIAGNOSIS — H31012 Macula scars of posterior pole (postinflammatory) (post-traumatic), left eye: Secondary | ICD-10-CM | POA: Diagnosis not present

## 2024-03-14 ENCOUNTER — Ambulatory Visit: Admitting: Family Medicine

## 2024-03-17 ENCOUNTER — Ambulatory Visit: Admitting: Family Medicine

## 2024-03-21 ENCOUNTER — Encounter: Payer: Self-pay | Admitting: Family Medicine

## 2024-03-21 ENCOUNTER — Ambulatory Visit (INDEPENDENT_AMBULATORY_CARE_PROVIDER_SITE_OTHER): Admitting: Family Medicine

## 2024-03-21 VITALS — BP 130/80 | HR 83 | Temp 97.5°F | Ht 70.0 in | Wt 213.2 lb

## 2024-03-21 DIAGNOSIS — R1030 Lower abdominal pain, unspecified: Secondary | ICD-10-CM | POA: Diagnosis not present

## 2024-03-21 DIAGNOSIS — E119 Type 2 diabetes mellitus without complications: Secondary | ICD-10-CM

## 2024-03-21 MED ORDER — METHOCARBAMOL 500 MG PO TABS: 500.0000 mg | ORAL_TABLET | Freq: Three times a day (TID) | ORAL | 0 refills | Status: AC | PRN

## 2024-03-21 NOTE — Progress Notes (Unsigned)
 Felt pain with lifting, when working on a car about 10 days ago.  Pain laying on R side at night.  He was picking up a generator.  He felt a pull/pain at the time.  No pain on the L side.  Prev with some leg pain but not now.  He may feel a bulge in the area.  No scrotal changes or dysuria.     No hernia.  Likely groin strain.

## 2024-03-21 NOTE — Patient Instructions (Addendum)
 Tylenol  if needed for pain.  Update me as needed.  Activity as tolerated.  Robaxin  if needed.   A1c around November. I'll check on [place] vs [place] options.  Take care.  Glad to see you.

## 2024-03-22 DIAGNOSIS — H31012 Macula scars of posterior pole (postinflammatory) (post-traumatic), left eye: Secondary | ICD-10-CM | POA: Diagnosis not present

## 2024-03-22 DIAGNOSIS — R1030 Lower abdominal pain, unspecified: Secondary | ICD-10-CM | POA: Insufficient documentation

## 2024-03-22 DIAGNOSIS — H2511 Age-related nuclear cataract, right eye: Secondary | ICD-10-CM | POA: Diagnosis not present

## 2024-03-22 DIAGNOSIS — H1711 Central corneal opacity, right eye: Secondary | ICD-10-CM | POA: Diagnosis not present

## 2024-03-22 DIAGNOSIS — H47292 Other optic atrophy, left eye: Secondary | ICD-10-CM | POA: Diagnosis not present

## 2024-03-22 NOTE — Assessment & Plan Note (Signed)
 Likely a strain.  I do not feel a hernia.  Discussed that he could have a small occult and nonpalpable hernia. Tylenol  if needed for pain.  Update me as needed.  Activity as tolerated.  Robaxin  if needed.  If he feels a mass then will let me know.  He agrees with plan.

## 2024-03-30 ENCOUNTER — Ambulatory Visit: Admitting: Family Medicine

## 2024-04-10 ENCOUNTER — Ambulatory Visit (INDEPENDENT_AMBULATORY_CARE_PROVIDER_SITE_OTHER)

## 2024-04-10 ENCOUNTER — Ambulatory Visit: Payer: Self-pay | Admitting: Internal Medicine

## 2024-04-10 DIAGNOSIS — G459 Transient cerebral ischemic attack, unspecified: Secondary | ICD-10-CM

## 2024-04-10 LAB — CUP PACEART REMOTE DEVICE CHECK
Date Time Interrogation Session: 20250906231725
Implantable Pulse Generator Implant Date: 20240521

## 2024-04-11 ENCOUNTER — Telehealth: Payer: Self-pay | Admitting: Family Medicine

## 2024-04-11 DIAGNOSIS — H2511 Age-related nuclear cataract, right eye: Secondary | ICD-10-CM | POA: Diagnosis not present

## 2024-04-11 NOTE — Telephone Encounter (Signed)
 Placed in your tray

## 2024-04-11 NOTE — Telephone Encounter (Signed)
 Patient spouse dropped off pre op form to be filled out by provider. Please fax back to facility provided.

## 2024-04-12 NOTE — Telephone Encounter (Signed)
 I will work on [employer].  Thanks.

## 2024-04-14 ENCOUNTER — Telehealth: Payer: Self-pay | Admitting: Cardiology

## 2024-04-14 ENCOUNTER — Telehealth: Payer: Self-pay

## 2024-04-14 NOTE — Telephone Encounter (Signed)
 Received clearance request for patient to have cataract surgery. PCP wanted me to reach out to patients cardiology to see how they feel in reference to patient being cleared for surgery. Reached out to cardiology and they are routing a message to patients cardiologist to obtain input. Awaiting call back or message from cardiology office.

## 2024-04-14 NOTE — Telephone Encounter (Signed)
 Per Tyler Satterfield, NP: He does not need clearance unless surgeon requests. I read his last note from Dr. Shlomo, dated 02/16/2024 who stated he was doing well. He is on Plavix . Surgeon may request clearance concerning this medication.    A secure chat was also sent.

## 2024-04-14 NOTE — Telephone Encounter (Signed)
 He does not need clearance unless surgeon requests. I read his last note from Dr. Shlomo, dated 02/16/2024 who stated he was doing well. He is on Plavix . Surgeon may request clearance concerning this medication.

## 2024-04-14 NOTE — Telephone Encounter (Signed)
 Pt's Primary Care office called and would like to know if pt needs to have clearance for surgery since he's had an EKG done recently.

## 2024-04-16 NOTE — Telephone Encounter (Signed)
 Please have cardiology review this again.  I see where the patient was seen by Dr. Shlomo on 02/16/2023, but not 7/162025.    I do not see Plavix  listed on his medication list.  Thanks.

## 2024-04-18 ENCOUNTER — Other Ambulatory Visit: Payer: Self-pay | Admitting: Family Medicine

## 2024-04-19 DIAGNOSIS — Z23 Encounter for immunization: Secondary | ICD-10-CM | POA: Diagnosis not present

## 2024-04-19 NOTE — Telephone Encounter (Unsigned)
 Copied from CRM [id number]. Topic: General - Other >> Apr 19, 2024  2:56 PM Franky GRADE wrote: Reason for CRM: Patient's wife is calling to follow up on the clearance for surgery as the surgery is scheduled on Tuesday 04/25/2024.

## 2024-04-20 NOTE — Telephone Encounter (Signed)
   Patient Name: Tyler Guerra.  DOB: 1950/12/27 MRN: [id number]  Primary Cardiologist: None  Chart reviewed as part of pre-operative protocol coverage.  On my and I only see aspirin  81 mg listed under the patient's medication list and not Plavix .  To echo what Lamarr Satterfield, NP explained, patient's surgeon will have to file an official cardiac clearance request before it can be addressed.  I do not see any request in the chart currently.  I will remove this thread from the pool until we have an official request.  It can be addressed at that time.  Tyler LOISE Fabry, PA-C 04/20/2024, 11:15 AM

## 2024-04-20 NOTE — Telephone Encounter (Signed)
 Spoke with cardiology and they sent the information to the pre-op team to get additional information

## 2024-04-20 NOTE — Telephone Encounter (Signed)
 Left message to advise that clearance information has been faxed

## 2024-04-20 NOTE — Telephone Encounter (Signed)
 Received the following message:  Cleatus Arlyss RAMAN, MD routed this conversation to Me  Cleatus Gunnels (Selected Message) Cleatus Arlyss RAMAN, MD    04/16/24 10:49 PM Note Please have cardiology review this again.  I see where the patient was seen by Dr. Shlomo on 02/16/2023, but not 7/162025.     I do not see Plavix  listed on his medication list.  Thanks.      04/14/24  2:21 PM Williams, Avaletta L, CMA routed this conversation to Cleatus Arlyss RAMAN, MD    04/14/24 12:12 PM You routed this conversation to Trudy Davene CROME, CMA  Me    04/14/24 12:12 PM Note Per Lamarr Satterfield, NP: He does not need clearance unless surgeon requests. I read his last note from Dr. Shlomo, dated 02/16/2024 who stated he was doing well. He is on Plavix . Surgeon may request clearance concerning this medication.     A secure chat was also sent.        I will route to the Preop Pool for the Preop APP to review

## 2024-04-20 NOTE — Progress Notes (Signed)
 Remote Loop Recorder Transmission

## 2024-04-25 DIAGNOSIS — H2511 Age-related nuclear cataract, right eye: Secondary | ICD-10-CM | POA: Diagnosis not present

## 2024-04-29 NOTE — Progress Notes (Signed)
 Remote Loop Recorder Transmission

## 2024-05-10 ENCOUNTER — Ambulatory Visit (INDEPENDENT_AMBULATORY_CARE_PROVIDER_SITE_OTHER)

## 2024-05-10 DIAGNOSIS — G459 Transient cerebral ischemic attack, unspecified: Secondary | ICD-10-CM

## 2024-05-11 ENCOUNTER — Encounter

## 2024-05-11 LAB — CUP PACEART REMOTE DEVICE CHECK
Date Time Interrogation Session: 20251007232258
Implantable Pulse Generator Implant Date: 20240521

## 2024-05-15 NOTE — Progress Notes (Signed)
 Remote Loop Recorder Transmission

## 2024-05-17 ENCOUNTER — Ambulatory Visit: Payer: Self-pay | Admitting: Internal Medicine

## 2024-05-29 ENCOUNTER — Other Ambulatory Visit: Payer: Self-pay | Admitting: Physical Medicine and Rehabilitation

## 2024-06-05 ENCOUNTER — Ambulatory Visit: Payer: Medicare Other | Admitting: Neurology

## 2024-06-10 ENCOUNTER — Encounter

## 2024-06-11 ENCOUNTER — Ambulatory Visit

## 2024-06-11 DIAGNOSIS — G459 Transient cerebral ischemic attack, unspecified: Secondary | ICD-10-CM

## 2024-06-11 LAB — CUP PACEART REMOTE DEVICE CHECK
Date Time Interrogation Session: 20251107231557
Implantable Pulse Generator Implant Date: 20240521

## 2024-06-12 ENCOUNTER — Encounter

## 2024-06-14 NOTE — Progress Notes (Signed)
 Remote Loop Recorder Transmission

## 2024-06-18 ENCOUNTER — Ambulatory Visit: Payer: Self-pay | Admitting: Internal Medicine

## 2024-07-11 ENCOUNTER — Encounter

## 2024-07-11 ENCOUNTER — Ambulatory Visit

## 2024-07-11 DIAGNOSIS — G459 Transient cerebral ischemic attack, unspecified: Secondary | ICD-10-CM | POA: Diagnosis not present

## 2024-07-12 ENCOUNTER — Ambulatory Visit: Payer: Self-pay | Admitting: Internal Medicine

## 2024-07-12 ENCOUNTER — Ambulatory Visit

## 2024-07-12 LAB — CUP PACEART REMOTE DEVICE CHECK
Date Time Interrogation Session: 20251208232951
Implantable Pulse Generator Implant Date: 20240521

## 2024-07-12 NOTE — Progress Notes (Signed)
 Remote Loop Recorder Transmission

## 2024-07-13 ENCOUNTER — Encounter

## 2024-08-07 ENCOUNTER — Encounter: Payer: Self-pay | Admitting: Neurology

## 2024-08-07 ENCOUNTER — Ambulatory Visit (INDEPENDENT_AMBULATORY_CARE_PROVIDER_SITE_OTHER): Admitting: Neurology

## 2024-08-07 VITALS — BP 108/68 | HR 105 | Ht 70.0 in | Wt 216.0 lb

## 2024-08-07 DIAGNOSIS — I639 Cerebral infarction, unspecified: Secondary | ICD-10-CM

## 2024-08-07 NOTE — Progress Notes (Signed)
 "   Follow-up Visit   Date: 08/07/2024    Tyler Guerra. MRN: [id number] DOB: 1951-04-22    Tyler Guerra. is a 74 y.o. right-handed male with CAD, hyperlipidemia, hypertension, GERD, anxiety/depression returning to the clinic for follow-up of right ACA infarct.  The patient was accompanied to the clinic by wife who also provides collateral information.    IMPRESSION/PLAN: Cerebrovascular disease with intracranial stenosis and history of small right periatrial white matter stroke manifesting with left side weakness, left hemianopia, and confusion (12/19/2023) while on DAPT.  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  No evidence of afib on loop recorder.  He was discharged on aspirin  + Brilinta  x 4 weeks, then aspirin  alone as he failed DAPT.  Unfortunately, medication options are limited and he remains at risk for future stroke.  Secondary risk factors are well-controlled as noted below.   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  He was on aspirin  and plavix  already, so switched to aspirin  and Brilinta . There was concern that he may have embolic event.  Echo showed normal EF, no shunt.  He was discharged home and had loop recorder placed on 5/21.  He feels that the left leg is a little stronger, but it still tends to buckle when walking. He denies numbness/tingling of the left leg.    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  UPDATE 05/31/2023:  He is here for follow-up visit.  He completed PT and has noticed that left leg buckling sensation and weakness has improved.  Sometimes, he has some  imbalance such as when trying to put pants on when he is standing, but overall he is pleased with his progressive.  No further buckling spells or weakness.  No new complaints  BP is better controlled with medication changes per PCP.   UPDATE 02/01/2024:  He is here for hospital discharge follow-up. He was admitted to [facility] 5/18 - 5/21 with small right periatrial white matter manifesting with left side weakness, left hemianopia, and confusion (12/19/2023) while on DAPT.  Vessel imaging shows no extracranial stenosis, but there is intracranial stenosis with chronic occlusion of the right ACA.  No evidence of afib on loop recorder.  He was started on aspirin  and Brillinta x 4 weeks, and now on monotherapy with aspirin  as he failed DAPT.   He continues to to PT/OT.  His left side weakness has improved.  He has some weakness with fine motor tasks, but overall doing well.   UPDATE 08/07/2024:  He is here for follow-up visit.  He completed PT and has improved left side weakness.  He no longer has vision symptoms.  Overall, he is doing well and compliant with medications.  No new complaints or symptoms.   Medications:  Current Outpatient Medications on File Prior to Visit  Medication Sig Dispense Refill   allopurinol  (ZYLOPRIM ) 300 MG tablet Take 1 tablet (300 mg total) by mouth daily. 90 tablet 3   amantadine  (SYMMETREL ) 100 MG capsule TAKE 1 CAPSULE DAILY 90 capsule 0   aspirin  EC 81 MG tablet Take 1 tablet (81 mg total) by mouth daily. 90  tablet 3   atorvastatin  (LIPITOR ) 80 MG tablet TAKE 1 TABLET DAILY 90 tablet 3   Cholecalciferol  (VITAMIN D  PO) Take 1 capsule by mouth daily.     escitalopram  (LEXAPRO ) 5 MG tablet Take 1 tablet (5 mg total) by mouth daily. 90 tablet 3   losartan  (COZAAR ) 25 MG tablet TAKE 1 TABLET DAILY 90 tablet 2   Misc Natural Products (BLACK CHERRY CONCENTRATE PO) Take 1 tablet by mouth at bedtime.     Multiple Vitamins-Minerals (OCUVITE PO) Take 1 capsule by mouth daily.      nitroGLYCERIN  (NITROSTAT ) 0.4 MG SL tablet Place 1 tablet (0.4 mg total) under the tongue every 5 (five) minutes as needed for chest pain. 25 tablet 2   pantoprazole  (PROTONIX ) 40 MG tablet Take 1 tablet (40 mg total) by mouth daily. 90 tablet 3   polycarbophil (FIBERCON) 625 MG tablet Take 1 tablet (625 mg total) by mouth daily. 30 tablet 0   polyethylene glycol powder (GLYCOLAX /MIRALAX ) 17 GM/SCOOP powder Take 17 g by mouth daily as needed for mild constipation. 238 g 0   polyvinyl alcohol  (LIQUIFILM TEARS) 1.4 % ophthalmic solution Place 1 drop into both eyes daily as needed for dry eyes.     QUEtiapine  (SEROQUEL ) 25 MG tablet TAKE 1/2 TO 1 TABLET BY MOUTH AT BEDTIME 30 tablet 0   sodium chloride  (OCEAN) 0.65 % SOLN nasal spray Place 1 spray into both nostrils as needed for congestion.     tamsulosin  (FLOMAX ) 0.4 MG CAPS capsule Take 1 capsule (0.4 mg total) by mouth daily after supper.     methocarbamol  (ROBAXIN ) 500 MG tablet Take 1 tablet (500 mg total) by mouth every 8 (eight) hours as needed for muscle spasms. (Patient not taking: Reported on 08/07/2024) 30 tablet 0   No current facility-administered medications on file prior to visit.    Allergies: No Known Allergies  Vital Signs:  BP 108/68   Pulse (!) 105   Ht 5' 10 (1.778 m)   Wt 216 lb (98 kg)   SpO2 97%   BMI 30.99 kg/m    Neurological Exam: MENTAL STATUS including orientation to time, place, person, recent and remote memory, attention span and concentration, language, and fund of knowledge is normal.  Speech is not dysarthric.  CRANIAL NERVES:  Pupils equal round and reactive to light.  Normal conjugate, extra-ocular eye movements in all directions of gaze.  No ptosis.  Face is symmetric. Palate elevates symmetrically.  Tongue is midline.  MOTOR:  Motor strength is 5/5 in all extremities, including left leg.  No atrophy, fasciculations or abnormal movements.  No pronator drift.  Tone is normal.  Finger testing is normal.    MSRs:  Reflexes are 2+/4 throughout.  SENSORY:  Intact to vibration throughout.  COORDINATION/GAIT:  Normal finger-to- nose-finger.  Gait is mildly wide based, stable, unassisted.   Data: MRI brain wo contrast 12/19/2023: 1. 3 mm acute infarct within the right periatrial white matter. 2. Background parenchymal atrophy, chronic small vessel disease and redemonstrated chronic infarcts as described.   CTA head and neck 12/19/2023: CTA neck:  1. The common carotid and internal carotid arteries are patent within the neck without stenosis. Mild atherosclerotic plaque bilaterally, as described. 2. The vertebral arteries are patent within the neck without stenosis or significant atherosclerotic disease. 3. Aortic Atherosclerosis (ICD10-I70.0).   CTA head: 1. Focal occlusion versus severe, near-occlusive stenosis of the right anterior cerebral artery A3 segment. It is difficult to determine if this was present  on the prior CTA of 12/22/2022 (due to the degree of motion degradation on the prior exam). Immediately downstream from this, additional severe stenosis within the right anterior cerebral artery A3 segment. 2. Background intracranial atherosclerotic disease. Most notably, calcified plaque results in up to moderate stenosis of the paraclinoid right internal carotid artery.   CTA head and neck 12/22/2022: No emergent large vessel occlusion or hemodynamically significant stenosis of the head or neck.  Aortic atherosclerosis (ICD10-I70.0).   MRI brain wo contrast 12/21/2022: Intermediate sized area of acute ischemia within the right anterior cerebral artery territory. No hemorrhage or mass effect.  Lab Results  Component Value Date   CHOL 100 12/20/2023   HDL 34 (L) 12/20/2023   LDLCALC 51 12/20/2023   LDLDIRECT 97.0 10/01/2020   TRIG 76 12/20/2023   CHOLHDL 2.9 12/20/2023   Lab Results  Component Value Date   HGBA1C 6.7 (H) 11/30/2023     Thank you for allowing me to  participate in patient's care.  If I can answer any additional questions, I would be pleased to do so.    Sincerely,    Moody Robben K. Tobie, DO   "

## 2024-08-11 ENCOUNTER — Encounter

## 2024-08-11 ENCOUNTER — Ambulatory Visit: Attending: Cardiovascular Disease

## 2024-08-11 DIAGNOSIS — G459 Transient cerebral ischemic attack, unspecified: Secondary | ICD-10-CM

## 2024-08-12 ENCOUNTER — Ambulatory Visit

## 2024-08-13 LAB — CUP PACEART REMOTE DEVICE CHECK
Date Time Interrogation Session: 20260108232342
Implantable Pulse Generator Implant Date: 20240521

## 2024-08-14 ENCOUNTER — Encounter

## 2024-08-15 NOTE — Progress Notes (Signed)
 Remote Loop Recorder Transmission

## 2024-08-18 ENCOUNTER — Ambulatory Visit: Payer: Self-pay | Admitting: Cardiovascular Disease

## 2024-08-31 ENCOUNTER — Other Ambulatory Visit: Payer: Self-pay | Admitting: Family Medicine

## 2024-08-31 NOTE — Telephone Encounter (Addendum)
 Pls associate dx code to rx:  Amantadine  Last filled:  04/21/24, #90 Last OV:

## 2024-09-01 ENCOUNTER — Encounter: Attending: Physical Medicine and Rehabilitation | Admitting: Physical Medicine and Rehabilitation

## 2024-09-01 ENCOUNTER — Encounter: Payer: Self-pay | Admitting: Physical Medicine and Rehabilitation

## 2024-09-01 VITALS — BP 127/84 | HR 87 | Ht 70.0 in | Wt 220.0 lb

## 2024-09-01 DIAGNOSIS — I639 Cerebral infarction, unspecified: Secondary | ICD-10-CM | POA: Insufficient documentation

## 2024-09-01 DIAGNOSIS — Z8673 Personal history of transient ischemic attack (TIA), and cerebral infarction without residual deficits: Secondary | ICD-10-CM | POA: Diagnosis present

## 2024-09-01 MED ORDER — QUETIAPINE FUMARATE 25 MG PO TABS: 12.5000 mg | ORAL_TABLET | Freq: Every day | ORAL | 1 refills | Status: AC

## 2024-09-01 NOTE — Patient Instructions (Signed)
 Pt is a 74 yr old male with hx of R parietal CVA with impaired cognition, HTN, intracranial atherosclerosis, DM A1c 6.7; HLD; Prostate CA; Class 1 obesity; BMI 30.10; hx of gout, mood disorder on Lexapro  and here for f/u on CVA. Has loop recorder- no Afib.    If he starts to get fuzzy, TAKE A NAP asap.  Or stuttering or word finding - it's a sign brain needs to sleep. Can be up to 1 year or longer after a stroke- esp parietal stroke.   2  PCP called in Amantadine  100 mg daily- for pt- - couldn't come off it -sx's got worse- PCP just called in refill today.   3. Con't Seroquel  12.5 to 25 mg at bedtime for sleep- continue this dosing- will call in 3 months refills with 6 months supply.    4.   F/U in 6 months-  if doing OK, can move to just seeing PCP for stroke f/u and sleep/Amantadine .

## 2024-09-01 NOTE — Progress Notes (Signed)
 "  Subjective:    Patient ID: Tyler Guerra Rhett Mickey., male    DOB: 01-20-1951, 74 y.o.   MRN: [id number]  HPI   Pt is a 74 yr old male with hx of R parietal CVA with impaired cognition, HTN, intracranial atherosclerosis, DM A1c 6.7; HLD; Prostate CA; Class 1 obesity; BMI 30.10- up to 31.57; hx of gout, mood disorder on Lexapro  and here for f/u on CVA. Has loop recorder- no Afib.     Pretty good No assistive device and no falls.   No near falls either.    Thinking/cognition pretty good in the AM;  and when wakes up from a nap.  If gets up and going- gets worn out- after 1 hour or so.  Could go lay down and fall asleep.   Takes naps after lunch- ~ 1-2 pm- usually 2 hours.  Trying to do 1 nap/day- if something doesn't wake him up, could sleep 3+ hours-   Gets up  11 am to 1pm for the day If weather isn't bad, will go walk outside. Sometimes takes nap or rests- sometimes fall asleep.  Goes to bed  10-11 pm-   Wife doesn't think he's tangential When he first wakes up-  seems confused when first wakes up to get his thoughts together.  Fine if wakes up on his own, but confused if woken up early.   Sleeps lightly.   Wife gets up 6-8am Sleeps more soundly after she gets out of bed/out of the room.   Not concerned about any particular topic today.   Electronic calendar for keeping on task.  Got from family- helpful.   Can now stand on 1 leg now- longer than used to  Can walk at [employer]- 1 hour at grocery store-  if stops  too long, legs LLE will start shaking, like L knee will give out.  Wife won't  let pt outside right now, because complete ice!  Cognition-  not quite at baseline yet- not as good as was after first stroke- 2nd stroke affect cognitively more.  Won't always look at calendar- to do tasks.  80% better since 2nd stroke per wife.  Did need to tell him to take pills if left when was asleep.     Speech- fairly normal now per pt-  if family picks something up , asks them  to let him know-  Per wife, tries to get the words out and processing before he tries to get a word out   Uses phone the same way as usual [employer] guy- can use phone correctly to search for races, and racers, etc.  More short term memory he struggles with per wife.  Remembers that did stuff when given cues.    Amantadine - kept on Amantadine - so needs refills.  Noticed a big change when tried to come off it- so went back on it.   Sleep-   taking 1/2 tab Seroquel  at night to sleep- helps him relax to go to sleep better.   Dr Cleatus- PCP just called in Amantadine -   Saw Neuro- Dr Tobie- this month- will see next year since doing well.  Both strokes were in May- so trying to get past May.    Pain Inventory Average Pain 0 Pain Right Now 0 My pain is .  In the last 24 hours, has pain interfered with the following? General activity 0 Relation with others 0 Enjoyment of life 0 What TIME of day is your pain at  its worst? varies Sleep (in general) Good  Pain is worse with: . Pain improves with: . Relief from Meds: .  Family History  Problem Relation Age of Onset   Dementia Mother    Heart disease Mother    Cancer Father        prostate in eighties   Prostate cancer Father    Heart disease Father    Stroke Father    Cancer Sister        breast   Anxiety disorder Sister    Colon cancer Maternal Grandfather    Cancer Paternal Uncle        prostate   Social History   Socioeconomic History   Marital status: Married    Spouse name: Not on file   Number of children: 2   Years of education: Not on file   Highest education level: 12th grade  Occupational History   Occupation: Chief Technology Officer: [employer]    Comment: [employer], travelling 3-4 nights a week  Tobacco Use   Smoking status: Never   Smokeless tobacco: Never  Vaping Use   Vaping status: Never Used  Substance and Sexual Activity   Alcohol  use: No   Drug use: No   Sexual  activity: Yes  Other Topics Concern   Not on file  Social History Narrative   Married 1975   2 daughters and 2 grandkids   Retired 2018.      Right Handed    Lives in a two story home    Social Drivers of Health   Tobacco Use: Low Risk (03/21/2024)   Patient History    Smoking Tobacco Use: Never    Smokeless Tobacco Use: Never    Passive Exposure: Not on file  Financial Resource Strain: Low Risk (02/21/2024)   Overall Financial Resource Strain (CARDIA)    Difficulty of Paying Living Expenses: Not hard at all  Food Insecurity: No Food Insecurity (02/21/2024)   Epic    Worried About Radiation Protection Practitioner of Food in the Last Year: Never true    Ran Out of Food in the Last Year: Never true  Transportation Needs: No Transportation Needs (02/21/2024)   Epic    Lack of Transportation (Medical): No    Lack of Transportation (Non-Medical): No  Physical Activity: Insufficiently Active (02/21/2024)   Exercise Vital Sign    Days of Exercise per Week: 4 days    Minutes of Exercise per Session: 10 min  Stress: No Stress Concern Present (02/21/2024)   [employer] of Occupational Health - Occupational Stress Questionnaire    Feeling of Stress: Not at all  Social Connections: Moderately Isolated (02/21/2024)   Social Connection and Isolation Panel    Frequency of Communication with Friends and Family: More than three times a week    Frequency of Social Gatherings with Friends and Family: More than three times a week    Attends Religious Services: Never    Database Administrator or Organizations: No    Attends Banker Meetings: Never    Marital Status: Married  Depression (PHQ2-9): Low Risk (02/21/2024)   Depression (PHQ2-9)    PHQ-2 Score: 1  Alcohol  Screen: Low Risk (02/21/2024)   Alcohol  Screen    Last Alcohol  Screening Score (AUDIT): 0  Housing: Low Risk (02/21/2024)   Epic    Unable to Pay for Housing in the Last Year: No    Number of Times Moved in the Last Year: 0  Homeless in the Last Year: No  Utilities: Not At Risk (02/21/2024)   Epic    Threatened with loss of utilities: No  Health Literacy: Adequate Health Literacy (02/21/2024)   B1300 Health Literacy    Frequency of need for help with medical instructions: Never   Past Surgical History:  Procedure Laterality Date   APPENDECTOMY  1975   CARDIAC CATHETERIZATION  12-22-2001   dr victory sharps   severe native vessel disease mLAD 60-70%,  total occulsion pCFX  and pRCA/  widely patent saphenous vein , free radial , and LIMA grafts/  minminal lv dysfunction, ef 60%   CHEST TUBE INSERTION Right 10/11/2019   CORONARY ARTERY BYPASS GRAFT  12/1998   LIMA to LAD,  SVG to PDA and Diagonal, Free radial graft to OM   CORONARY STENT INTERVENTION N/A 05/26/2021   Procedure: CORONARY STENT INTERVENTION;  Surgeon: Wendel Lurena POUR, MD;  Location: [facility];  Service: Cardiovascular;  Laterality: N/A;   Exericse treadmill test  last one 01-03-2014  dr victory sharps   normal exercise tolerance w/ hypertensive repsonse,  no ischemic EKG changes, appropriate HR response & recovery (Duke TM score 9;  Low Risk , PVC's w/ exertion)   EYE SURGERY     FRACTURE SURGERY     LEFT HEART CATH AND CORS/GRAFTS ANGIOGRAPHY N/A 05/26/2021   Procedure: LEFT HEART CATH AND CORS/GRAFTS ANGIOGRAPHY;  Surgeon: Wendel Lurena POUR, MD;  Location: [facility];  Service: Cardiovascular;  Laterality: N/A;   LOOP RECORDER INSERTION N/A 12/22/2022   Procedure: LOOP RECORDER INSERTION;  Surgeon: Waddell Danelle ORN, MD;  Location: [facility];  Service: Cardiovascular;  Laterality: N/A;   PROSTATE BIOPSY     RADIOACTIVE SEED IMPLANT N/A 01/13/2016   Procedure: RADIOACTIVE SEED IMPLANT/BRACHYTHERAPY IMPLANT;  Surgeon: Alm Fragmin, MD;  Location: [facility];  Service: Urology;  Laterality: N/A;   Past Surgical History:  Procedure Laterality Date   APPENDECTOMY  1975   CARDIAC CATHETERIZATION  12-22-2001   dr  victory sharps   severe native vessel disease mLAD 60-70%,  total occulsion pCFX  and pRCA/  widely patent saphenous vein , free radial , and LIMA grafts/  minminal lv dysfunction, ef 60%   CHEST TUBE INSERTION Right 10/11/2019   CORONARY ARTERY BYPASS GRAFT  12/1998   LIMA to LAD,  SVG to PDA and Diagonal, Free radial graft to OM   CORONARY STENT INTERVENTION N/A 05/26/2021   Procedure: CORONARY STENT INTERVENTION;  Surgeon: Wendel Lurena POUR, MD;  Location: [facility];  Service: Cardiovascular;  Laterality: N/A;   Exericse treadmill test  last one 01-03-2014  dr victory sharps   normal exercise tolerance w/ hypertensive repsonse,  no ischemic EKG changes, appropriate HR response & recovery (Duke TM score 9;  Low Risk , PVC's w/ exertion)   EYE SURGERY     FRACTURE SURGERY     LEFT HEART CATH AND CORS/GRAFTS ANGIOGRAPHY N/A 05/26/2021   Procedure: LEFT HEART CATH AND CORS/GRAFTS ANGIOGRAPHY;  Surgeon: Wendel Lurena POUR, MD;  Location: [facility];  Service: Cardiovascular;  Laterality: N/A;   LOOP RECORDER INSERTION N/A 12/22/2022   Procedure: LOOP RECORDER INSERTION;  Surgeon: Waddell Danelle ORN, MD;  Location: [facility];  Service: Cardiovascular;  Laterality: N/A;   PROSTATE BIOPSY     RADIOACTIVE SEED IMPLANT N/A 01/13/2016   Procedure: RADIOACTIVE SEED IMPLANT/BRACHYTHERAPY IMPLANT;  Surgeon: Alm Fragmin, MD;  Location: [facility];  Service: Urology;  Laterality: N/A;   Past Medical History:  Diagnosis Date   Anxiety state 03/16/2016   Aortic stenosis    Mild to moderate by echo 12/2023 with mean aortic valve gradient 12 mmHg   Aphasia 01/26/2017   Benign localized prostatic hyperplasia with lower urinary tract symptoms (LUTS)    BPV (benign positional vertigo) 11/02/2014   Carotid artery stenosis    PER DUPLEX 10-22-2015  BILATERAL ICA 1-39%   Cataract    Coronary artery disease    CABG 2000 with LIMA> LAD, SVG > RCA and free radial > OM. S/P NSTEMI  05/2021 cath with luiminal Irrg SVG>RCA, occluded prox RCA, patent free radial>OM, occluded oLCx, occluded o1   Depression 01/26/2017   Diabetes mellitus without complication (HCC) 03/02/2018   GERD 07/19/2010   Qualifier: Diagnosis of  By: Cleatus MD, Arlyss     GERD (gastroesophageal reflux disease)    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   Gout    Gout 07/18/2010   Qualifier: Diagnosis of  By: Cleatus MD, Arlyss     H/O eye injury    chronic changes to left eye after injury   Hand weakness 03/02/2018   Hearing loss 04/21/2013   History of TIA (transient ischemic attack)    03/ 2017  no residual after brief episode loss peripheral vision   HOH (hard of hearing)    Hyperglycemia 05/17/2016   Hyperlipemia    Hyperlipidemia 07/18/2010   Qualifier: Diagnosis of  By: Cleatus MD, Graham     Hypertension    HYPERTENSION, BENIGN ESSENTIAL 07/18/2010   Qualifier: Diagnosis of  By: Cleatus MD, Graham     Hypokalemia 01/26/2017   Knee pain 03/13/2019   Left shoulder pain 09/04/2018   Pneumothorax 10/11/2019   RIGHT    Prostate cancer (HCC) UROLOGIST-  DR GRAPEY/  ONCOLOGIST-  DR MANNING   dx 2015--- Stage T1c, Gleason 3+4, PSA 4.03, vol 44cc   PVC's (premature ventricular contractions)    Rash and nonspecific skin eruption 10/14/2015   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   S/P CABG x 05 Dec 1998   Skin lesion 10/14/2015   Stroke [facility])    TIA (transient ischemic attack) 10/14/2015   Ventral hernia 10/26/2011   Wears glasses    BP 127/84   Pulse 87   Ht 5' 10 (1.778 m)   Wt 220 lb (99.8 kg)   SpO2 96%   BMI 31.57 kg/m   Opioid Risk Score:   Fall Risk Score:  `1  Depression screen [facility] 2/9     02/21/2024    2:16 PM 01/17/2024   10:57 AM 01/04/2024   10:03 AM 08/02/2023    2:44 PM 02/16/2023    1:44 PM 11/26/2022   11:35 AM 12/19/2021    3:33 PM  Depression screen PHQ 2/9  Decreased Interest 0 0 0 0 0 0 0  Down, Depressed, Hopeless 1 0 0 0 0 0 0  PHQ - 2 Score 1 0 0 0  0 0 0  Altered sleeping 0 0 0 0  0   Tired, decreased energy 0 0 0 1  2   Change in appetite 0 0 0 0  0   Feeling bad or failure about yourself  0 0 0 0  0   Trouble concentrating 0 0 0 0  0   Moving slowly or fidgety/restless 0 0 0 0  0   Suicidal thoughts 0 0 0 0  0   PHQ-9 Score 1  0  0  1   2    Difficult doing work/chores Not difficult at all  Not difficult at all Not difficult at all  Not difficult at all      Data saved with a previous flowsheet row definition     Review of Systems  All other systems reviewed and are negative.      Objective:   Physical Exam   Awake, alert, appropriate, HOH accompanied by wife, no Assistive device, NAD Very HOH and stuttering regularly vs strong word finding difficulty- every few words, repeats/changes words, etc.   Neuro: No increased tone or hoffman's no clonus either.  MSK- 5/5 in Ue's Le's B/L      Assessment & Plan:   Pt is a 74 yr old male with hx of R parietal CVA with impaired cognition, HTN, intracranial atherosclerosis, DM A1c 6.7; HLD; Prostate CA; Class 1 obesity; BMI 30.10; hx of gout, mood disorder on Lexapro  and here for f/u on CVA. Has loop recorder- no Afib.    If he starts to get fuzzy, TAKE A NAP asap.  Or stuttering or word finding - it's a sign brain needs to sleep. Can be up to 1 year or longer after a stroke- esp parietal stroke.   2  PCP called in Amantadine  100 mg daily- for pt- - couldn't come off it -sx's got worse- PCP just called in refill today.   3. Con't Seroquel  12.5 to 25 mg at bedtime for sleep- continue this dosing- will call in 3 months refills with 6 months supply.    4.   F/U in 6 months-  if doing OK, can move to just seeing PCP for stroke f/u and sleep/Amantadine .    I spent a total of   32  minutes on total care today- >50% coordination of care- due to d/w pt about f/u; prolonged d/w pt and wife about Sx's- esp cognition- and stuttering/word finding issues.    "

## 2024-09-11 ENCOUNTER — Encounter

## 2024-09-11 ENCOUNTER — Ambulatory Visit

## 2024-09-12 ENCOUNTER — Ambulatory Visit

## 2024-09-14 ENCOUNTER — Encounter

## 2024-10-12 ENCOUNTER — Encounter

## 2024-10-12 ENCOUNTER — Ambulatory Visit

## 2024-10-13 ENCOUNTER — Ambulatory Visit

## 2024-10-16 ENCOUNTER — Encounter

## 2024-11-12 ENCOUNTER — Encounter

## 2024-11-12 ENCOUNTER — Ambulatory Visit

## 2024-11-13 ENCOUNTER — Ambulatory Visit

## 2024-11-16 ENCOUNTER — Encounter

## 2024-12-13 ENCOUNTER — Ambulatory Visit

## 2024-12-13 ENCOUNTER — Encounter

## 2024-12-14 ENCOUNTER — Ambulatory Visit

## 2024-12-18 ENCOUNTER — Encounter

## 2025-01-18 ENCOUNTER — Encounter

## 2025-02-19 ENCOUNTER — Encounter

## 2025-02-21 ENCOUNTER — Ambulatory Visit

## 2025-03-02 ENCOUNTER — Encounter: Admitting: Physical Medicine and Rehabilitation

## 2025-03-22 ENCOUNTER — Encounter

## 2025-08-07 ENCOUNTER — Ambulatory Visit: Payer: Self-pay | Admitting: Neurology
